# Patient Record
Sex: Female | Born: 1950 | Race: White | Hispanic: No | Marital: Single | Smoking: Former smoker
Health system: Southern US, Community
[De-identification: ages and names within clinical notes are randomized; demographics above are authoritative.]

## PROBLEM LIST (undated history)

## (undated) DIAGNOSIS — E119 Type 2 diabetes mellitus without complications: Secondary | ICD-10-CM

## (undated) DIAGNOSIS — F32A Depression, unspecified: Secondary | ICD-10-CM

## (undated) DIAGNOSIS — F209 Schizophrenia, unspecified: Secondary | ICD-10-CM

## (undated) DIAGNOSIS — I639 Cerebral infarction, unspecified: Secondary | ICD-10-CM

## (undated) DIAGNOSIS — N3946 Mixed incontinence: Secondary | ICD-10-CM

## (undated) DIAGNOSIS — J189 Pneumonia, unspecified organism: Secondary | ICD-10-CM

## (undated) DIAGNOSIS — I10 Essential (primary) hypertension: Secondary | ICD-10-CM

## (undated) DIAGNOSIS — E785 Hyperlipidemia, unspecified: Secondary | ICD-10-CM

## (undated) DIAGNOSIS — M810 Age-related osteoporosis without current pathological fracture: Secondary | ICD-10-CM

## (undated) DIAGNOSIS — T7840XA Allergy, unspecified, initial encounter: Secondary | ICD-10-CM

## (undated) HISTORY — DX: Allergy, unspecified, initial encounter: T78.40XA

## (undated) HISTORY — DX: Cerebral infarction, unspecified: I63.9

## (undated) HISTORY — DX: Schizophrenia, unspecified: F20.9

## (undated) HISTORY — DX: Essential (primary) hypertension: I10

## (undated) HISTORY — DX: Depression, unspecified: F32.A

## (undated) HISTORY — DX: Age-related osteoporosis without current pathological fracture: M81.0

## (undated) HISTORY — DX: Hyperlipidemia, unspecified: E78.5

## (undated) HISTORY — DX: Mixed incontinence: N39.46

---

## 1999-03-02 ENCOUNTER — Encounter: Admission: RE | Admit: 1999-03-02 | Discharge: 1999-03-02 | Payer: Self-pay | Admitting: Obstetrics

## 1999-03-23 ENCOUNTER — Encounter: Admission: RE | Admit: 1999-03-23 | Discharge: 1999-03-23 | Payer: Self-pay | Admitting: Obstetrics & Gynecology

## 1999-04-11 ENCOUNTER — Ambulatory Visit (HOSPITAL_COMMUNITY): Admission: RE | Admit: 1999-04-11 | Discharge: 1999-04-11 | Payer: Self-pay

## 1999-04-18 ENCOUNTER — Ambulatory Visit (HOSPITAL_COMMUNITY): Admission: RE | Admit: 1999-04-18 | Discharge: 1999-04-18 | Payer: Self-pay | Admitting: *Deleted

## 1999-04-18 ENCOUNTER — Encounter: Payer: Self-pay | Admitting: *Deleted

## 1999-10-17 ENCOUNTER — Ambulatory Visit (HOSPITAL_COMMUNITY): Admission: RE | Admit: 1999-10-17 | Discharge: 1999-10-17 | Payer: Self-pay

## 1999-10-17 ENCOUNTER — Encounter: Payer: Self-pay | Admitting: *Deleted

## 2000-06-27 ENCOUNTER — Other Ambulatory Visit: Admission: RE | Admit: 2000-06-27 | Discharge: 2000-06-27 | Payer: Self-pay | Admitting: Obstetrics & Gynecology

## 2000-07-16 ENCOUNTER — Encounter: Admission: RE | Admit: 2000-07-16 | Discharge: 2000-07-16 | Payer: Self-pay | Admitting: Obstetrics and Gynecology

## 2000-07-16 ENCOUNTER — Encounter: Payer: Self-pay | Admitting: Obstetrics and Gynecology

## 2000-07-17 ENCOUNTER — Encounter: Payer: Self-pay | Admitting: Obstetrics and Gynecology

## 2000-07-17 ENCOUNTER — Ambulatory Visit (HOSPITAL_COMMUNITY): Admission: RE | Admit: 2000-07-17 | Discharge: 2000-07-17 | Payer: Self-pay | Admitting: Obstetrics and Gynecology

## 2001-07-10 ENCOUNTER — Other Ambulatory Visit: Admission: RE | Admit: 2001-07-10 | Discharge: 2001-07-10 | Payer: Self-pay | Admitting: Obstetrics and Gynecology

## 2001-07-18 ENCOUNTER — Ambulatory Visit (HOSPITAL_COMMUNITY): Admission: RE | Admit: 2001-07-18 | Discharge: 2001-07-18 | Payer: Self-pay | Admitting: Obstetrics and Gynecology

## 2001-07-18 ENCOUNTER — Encounter: Payer: Self-pay | Admitting: Obstetrics and Gynecology

## 2002-07-10 ENCOUNTER — Other Ambulatory Visit: Admission: RE | Admit: 2002-07-10 | Discharge: 2002-07-10 | Payer: Self-pay | Admitting: Obstetrics and Gynecology

## 2002-09-04 ENCOUNTER — Encounter: Payer: Self-pay | Admitting: Obstetrics and Gynecology

## 2002-09-04 ENCOUNTER — Ambulatory Visit (HOSPITAL_COMMUNITY): Admission: RE | Admit: 2002-09-04 | Discharge: 2002-09-04 | Payer: Self-pay | Admitting: Obstetrics and Gynecology

## 2003-11-16 ENCOUNTER — Ambulatory Visit (HOSPITAL_COMMUNITY): Admission: RE | Admit: 2003-11-16 | Discharge: 2003-11-16 | Payer: Self-pay | Admitting: Obstetrics and Gynecology

## 2004-11-17 ENCOUNTER — Ambulatory Visit (HOSPITAL_COMMUNITY): Admission: RE | Admit: 2004-11-17 | Discharge: 2004-11-17 | Payer: Self-pay | Admitting: Gynecology

## 2005-01-01 ENCOUNTER — Other Ambulatory Visit: Admission: RE | Admit: 2005-01-01 | Discharge: 2005-01-01 | Payer: Self-pay | Admitting: Obstetrics and Gynecology

## 2006-04-01 ENCOUNTER — Ambulatory Visit (HOSPITAL_COMMUNITY): Admission: RE | Admit: 2006-04-01 | Discharge: 2006-04-01 | Payer: Self-pay | Admitting: Obstetrics and Gynecology

## 2006-04-02 ENCOUNTER — Other Ambulatory Visit: Admission: RE | Admit: 2006-04-02 | Discharge: 2006-04-02 | Payer: Self-pay | Admitting: Obstetrics and Gynecology

## 2007-03-19 ENCOUNTER — Ambulatory Visit (HOSPITAL_COMMUNITY): Admission: RE | Admit: 2007-03-19 | Discharge: 2007-03-19 | Payer: Self-pay | Admitting: Obstetrics and Gynecology

## 2009-02-03 ENCOUNTER — Ambulatory Visit (HOSPITAL_COMMUNITY): Admission: RE | Admit: 2009-02-03 | Discharge: 2009-02-03 | Payer: Self-pay | Admitting: Urology

## 2010-12-09 ENCOUNTER — Encounter: Payer: Self-pay | Admitting: Obstetrics and Gynecology

## 2010-12-10 ENCOUNTER — Encounter: Payer: Self-pay | Admitting: Obstetrics and Gynecology

## 2010-12-10 ENCOUNTER — Encounter: Payer: Self-pay | Admitting: Family Medicine

## 2011-03-01 LAB — HEMOGLOBIN AND HEMATOCRIT, BLOOD
HCT: 37.4 % (ref 36.0–46.0)
Hemoglobin: 12.6 g/dL (ref 12.0–15.0)

## 2011-04-03 NOTE — Op Note (Signed)
[name], SERMONS           ACCOUNT NO.:  [id number]   MEDICAL RECORD NO.:  [id number]          PATIENT TYPE:  AMB   LOCATION:  DAY                          FACILITY:  [facility]   PHYSICIAN:  Excell Seltzer. Annabell Howells, M.D.    DATE OF BIRTH:  07/15/1951   DATE OF PROCEDURE:  02/03/2009  DATE OF DISCHARGE:                               OPERATIVE REPORT   PROCEDURE:  Cystoscopy, left retrograde pyelogram with interpretation,  insertion of left double-J stent.   PREOPERATIVE DIAGNOSIS:  Left ureteropelvic junction stone.   POSTOPERATIVE DIAGNOSIS:  Left ureteropelvic junction stone.   SURGEON:  Dr. Bjorn Pippin   ANESTHESIA:  General.   DRAIN:  6-French 24 cm double-J stent.   COMPLICATIONS:  None.   INDICATIONS:  Ms. [name] is a 60 year old white female with 8 mm left  UPJ stone with chronic recurrent urinary tract infections.  She is to  undergo stenting followed by lithotripsy.  She has been on Cipro  chronically for positive culture at her initial visit.   FINDINGS OF PROCEDURE:  She was taken operating room where general  anesthetic was induced.  She was given 400 mg Cipro I.V.  She was placed  in lithotomy position.  Her perineum and genitalia were prepped with  Betadine solution.  She was draped in the  usual sterile fashion.  Time-  out was performed.   Cystoscopy was performed using the 22-French scope and 12 degrees lens  examination revealed a normal urethra.  The bladder wall had mild  trabeculation.  No tumors or stones were noted.  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  The ureteral orifices were  unremarkable.   The left ureteral orifice was cannulated with 5-French open-end catheter  and contrast was instilled.   Retrograde pyelogram revealed a normal ureter up to the UPJ where the  stone was noted.  There was minimal hydronephrosis proximal to the  stone.   A guidewire was passed through the open-end catheter to the kidney by  the  stone.  There was some mildly turbid urine that flowed out.  Once  the wire had been placed, a 6-French 24 cm double-J stent was then  passed over the  wire to the kidney under fluoroscopic guidance.  The wire was removed  leaving good coil in the kidney and good coil in the bladder.  The  bladder was drained.  The patient was taken out of lithotomy position.  Her anesthetic was reversed.  She was moved to the recovery room in  stable condition.  There were no complications.      Excell Seltzer. Annabell Howells, M.D.  Electronically Signed     JJW/MEDQ  D:  02/03/2009  T:  02/03/2009  Job:  [id number]

## 2011-07-27 ENCOUNTER — Other Ambulatory Visit (HOSPITAL_COMMUNITY): Payer: Self-pay | Admitting: Obstetrics and Gynecology

## 2011-07-27 ENCOUNTER — Other Ambulatory Visit (HOSPITAL_COMMUNITY): Payer: Self-pay | Admitting: *Deleted

## 2011-07-27 DIAGNOSIS — Z1231 Encounter for screening mammogram for malignant neoplasm of breast: Secondary | ICD-10-CM

## 2011-07-27 DIAGNOSIS — Z780 Asymptomatic menopausal state: Secondary | ICD-10-CM

## 2011-10-15 ENCOUNTER — Ambulatory Visit (HOSPITAL_COMMUNITY)
Admission: RE | Admit: 2011-10-15 | Discharge: 2011-10-15 | Disposition: A | Discharge status: Home / Self Care | Payer: Medicare Other | Source: Ambulatory Visit | Attending: Obstetrics and Gynecology | Admitting: Obstetrics and Gynecology

## 2011-10-15 DIAGNOSIS — Z13820 Encounter for screening for osteoporosis: Secondary | ICD-10-CM | POA: Insufficient documentation

## 2011-10-15 DIAGNOSIS — Z780 Asymptomatic menopausal state: Secondary | ICD-10-CM | POA: Insufficient documentation

## 2011-10-15 DIAGNOSIS — Z1231 Encounter for screening mammogram for malignant neoplasm of breast: Secondary | ICD-10-CM

## 2011-10-22 ENCOUNTER — Other Ambulatory Visit: Payer: Self-pay | Admitting: Obstetrics and Gynecology

## 2011-10-22 DIAGNOSIS — R928 Other abnormal and inconclusive findings on diagnostic imaging of breast: Secondary | ICD-10-CM

## 2011-11-09 ENCOUNTER — Ambulatory Visit
Admission: RE | Admit: 2011-11-09 | Discharge: 2011-11-09 | Disposition: A | Discharge status: Home / Self Care | Payer: Medicare Other | Source: Ambulatory Visit | Attending: Obstetrics and Gynecology | Admitting: Obstetrics and Gynecology

## 2011-11-09 DIAGNOSIS — R928 Other abnormal and inconclusive findings on diagnostic imaging of breast: Secondary | ICD-10-CM

## 2011-11-14 ENCOUNTER — Other Ambulatory Visit: Payer: Medicare Other

## 2012-11-18 ENCOUNTER — Telehealth: Payer: Self-pay | Admitting: Obstetrics and Gynecology

## 2012-11-18 NOTE — Telephone Encounter (Signed)
TC to pt reccommended pt to try AZO otc and drink plenty of fluids. Advised pt that if she was unable to try this reccommended urgent care.  Pt will try AZO and if sx worsen will return call  Pt voiced understanding  Darien Ramus, CMA

## 2012-11-28 ENCOUNTER — Other Ambulatory Visit (HOSPITAL_COMMUNITY): Payer: Self-pay | Admitting: Physician Assistant

## 2012-11-28 DIAGNOSIS — Z1231 Encounter for screening mammogram for malignant neoplasm of breast: Secondary | ICD-10-CM

## 2012-12-09 ENCOUNTER — Ambulatory Visit (HOSPITAL_COMMUNITY): Payer: Medicare Other

## 2012-12-10 ENCOUNTER — Telehealth: Payer: Self-pay

## 2012-12-10 ENCOUNTER — Encounter: Payer: Self-pay | Admitting: Obstetrics and Gynecology

## 2012-12-10 NOTE — Telephone Encounter (Signed)
Faxed auth for 1 RF ONLY  On Premarin vaginal cream # 30 gm tube sig: use vaginally qod for two weeks, then use twice a week. , per EP to [facility] # [phone]. Melody Comas A

## 2012-12-15 ENCOUNTER — Ambulatory Visit (HOSPITAL_COMMUNITY)
Admission: RE | Admit: 2012-12-15 | Discharge: 2012-12-15 | Disposition: A | Discharge status: Home / Self Care | Payer: Medicare Other | Source: Ambulatory Visit | Attending: Physician Assistant | Admitting: Physician Assistant

## 2012-12-15 DIAGNOSIS — Z1231 Encounter for screening mammogram for malignant neoplasm of breast: Secondary | ICD-10-CM | POA: Insufficient documentation

## 2012-12-18 ENCOUNTER — Ambulatory Visit: Payer: Self-pay | Admitting: Family Medicine

## 2012-12-18 VITALS — BP 130/82 | HR 88 | Temp 98.5°F | Resp 16 | Ht 65.0 in | Wt 187.0 lb

## 2012-12-18 DIAGNOSIS — Z0289 Encounter for other administrative examinations: Secondary | ICD-10-CM

## 2012-12-18 DIAGNOSIS — Z0000 Encounter for general adult medical examination without abnormal findings: Secondary | ICD-10-CM

## 2012-12-18 NOTE — Progress Notes (Signed)
Patient for DOT physical.  Taking meds as directed.  No h/o significant psychosis or depression.  See DOT form  Approved for 2 years.

## 2012-12-23 ENCOUNTER — Encounter: Payer: Self-pay | Admitting: Family Medicine

## 2013-04-15 ENCOUNTER — Other Ambulatory Visit (HOSPITAL_COMMUNITY): Payer: Self-pay | Admitting: Physician Assistant

## 2013-04-15 DIAGNOSIS — Z1231 Encounter for screening mammogram for malignant neoplasm of breast: Secondary | ICD-10-CM

## 2014-04-23 ENCOUNTER — Other Ambulatory Visit (HOSPITAL_COMMUNITY): Payer: Self-pay | Admitting: Physician Assistant

## 2014-04-23 DIAGNOSIS — Z1231 Encounter for screening mammogram for malignant neoplasm of breast: Secondary | ICD-10-CM

## 2014-05-20 ENCOUNTER — Ambulatory Visit (HOSPITAL_COMMUNITY): Payer: Medicare Other

## 2014-05-20 ENCOUNTER — Ambulatory Visit (HOSPITAL_COMMUNITY)
Admission: RE | Admit: 2014-05-20 | Discharge: 2014-05-20 | Disposition: A | Discharge status: Home / Self Care | Payer: Medicare Other | Source: Ambulatory Visit | Attending: Physician Assistant | Admitting: Physician Assistant

## 2014-05-20 DIAGNOSIS — Z1231 Encounter for screening mammogram for malignant neoplasm of breast: Secondary | ICD-10-CM

## 2014-05-22 ENCOUNTER — Inpatient Hospital Stay (HOSPITAL_BASED_OUTPATIENT_CLINIC_OR_DEPARTMENT_OTHER)
Admission: EM | Admit: 2014-05-22 | Discharge: 2014-05-24 | DRG: 639 | Disposition: A | Discharge status: Home / Self Care | Payer: Medicare Other | Attending: Internal Medicine | Admitting: Internal Medicine

## 2014-05-22 ENCOUNTER — Emergency Department (HOSPITAL_BASED_OUTPATIENT_CLINIC_OR_DEPARTMENT_OTHER): Payer: Medicare Other

## 2014-05-22 ENCOUNTER — Ambulatory Visit (INDEPENDENT_AMBULATORY_CARE_PROVIDER_SITE_OTHER): Payer: Medicare Other | Admitting: Family Medicine

## 2014-05-22 ENCOUNTER — Encounter (HOSPITAL_BASED_OUTPATIENT_CLINIC_OR_DEPARTMENT_OTHER): Payer: Self-pay | Admitting: Emergency Medicine

## 2014-05-22 VITALS — BP 120/72 | HR 76 | Temp 98.1°F | Resp 18 | Ht 65.0 in | Wt 163.0 lb

## 2014-05-22 DIAGNOSIS — E1165 Type 2 diabetes mellitus with hyperglycemia: Secondary | ICD-10-CM

## 2014-05-22 DIAGNOSIS — Z8249 Family history of ischemic heart disease and other diseases of the circulatory system: Secondary | ICD-10-CM

## 2014-05-22 DIAGNOSIS — E119 Type 2 diabetes mellitus without complications: Secondary | ICD-10-CM | POA: Insufficient documentation

## 2014-05-22 DIAGNOSIS — Z794 Long term (current) use of insulin: Secondary | ICD-10-CM

## 2014-05-22 DIAGNOSIS — E1110 Type 2 diabetes mellitus with ketoacidosis without coma: Secondary | ICD-10-CM

## 2014-05-22 DIAGNOSIS — IMO0002 Reserved for concepts with insufficient information to code with codable children: Secondary | ICD-10-CM

## 2014-05-22 DIAGNOSIS — R350 Frequency of micturition: Secondary | ICD-10-CM

## 2014-05-22 DIAGNOSIS — Z87891 Personal history of nicotine dependence: Secondary | ICD-10-CM

## 2014-05-22 DIAGNOSIS — F39 Unspecified mood [affective] disorder: Secondary | ICD-10-CM | POA: Diagnosis present

## 2014-05-22 DIAGNOSIS — R739 Hyperglycemia, unspecified: Secondary | ICD-10-CM

## 2014-05-22 DIAGNOSIS — Z79899 Other long term (current) drug therapy: Secondary | ICD-10-CM

## 2014-05-22 DIAGNOSIS — Z880 Allergy status to penicillin: Secondary | ICD-10-CM

## 2014-05-22 DIAGNOSIS — N3946 Mixed incontinence: Secondary | ICD-10-CM | POA: Diagnosis present

## 2014-05-22 DIAGNOSIS — F209 Schizophrenia, unspecified: Secondary | ICD-10-CM | POA: Diagnosis present

## 2014-05-22 DIAGNOSIS — E118 Type 2 diabetes mellitus with unspecified complications: Principal | ICD-10-CM

## 2014-05-22 DIAGNOSIS — Z823 Family history of stroke: Secondary | ICD-10-CM

## 2014-05-22 DIAGNOSIS — R7309 Other abnormal glucose: Secondary | ICD-10-CM

## 2014-05-22 DIAGNOSIS — K5900 Constipation, unspecified: Secondary | ICD-10-CM

## 2014-05-22 DIAGNOSIS — R81 Glycosuria: Secondary | ICD-10-CM

## 2014-05-22 DIAGNOSIS — E1310 Other specified diabetes mellitus with ketoacidosis without coma: Principal | ICD-10-CM | POA: Diagnosis present

## 2014-05-22 DIAGNOSIS — Z836 Family history of other diseases of the respiratory system: Secondary | ICD-10-CM

## 2014-05-22 LAB — BASIC METABOLIC PANEL
Anion gap: >20 — ABNORMAL HIGH (ref 5–15)
Anion gap: 20 — ABNORMAL HIGH (ref 5–15)
Anion gap: 18 — ABNORMAL HIGH (ref 5–15)
BUN: 15 mg/dL (ref 6–23)
BUN: 11 mg/dL (ref 6–23)
BUN: 11 mg/dL (ref 6–23)
CO2: 13 mEq/L — ABNORMAL LOW (ref 19–32)
CO2: 13 mEq/L — ABNORMAL LOW (ref 19–32)
CO2: 15 meq/L — AB (ref 19–32)
CREATININE: 0.51 mg/dL (ref 0.50–1.10)
CREATININE: 0.77 mg/dL (ref 0.50–1.10)
Calcium: 9.3 mg/dL (ref 8.4–10.5)
Calcium: 8.4 mg/dL (ref 8.4–10.5)
Calcium: 8.3 mg/dL — ABNORMAL LOW (ref 8.4–10.5)
Chloride: 95 mEq/L — ABNORMAL LOW (ref 96–112)
Chloride: 104 mEq/L (ref 96–112)
Chloride: 104 mEq/L (ref 96–112)
Creatinine, Ser: 0.7 mg/dL (ref 0.50–1.10)
GFR calc Af Amer: >90 mL/min (ref >90)
GFR calc Af Amer: >90 mL/min (ref >90)
GFR calc Af Amer: >90 mL/min (ref >90)
GFR calc non Af Amer: >90 mL/min (ref >90)
GFR calc non Af Amer: >90 mL/min (ref >90)
GFR calc non Af Amer: 89 mL/min — ABNORMAL LOW (ref >90)
GLUCOSE: 679 mg/dL — AB (ref 70–99)
GLUCOSE: 323 mg/dL — AB (ref 70–99)
GLUCOSE: 331 mg/dL — AB (ref 70–99)
POTASSIUM: 4 meq/L (ref 3.7–5.3)
Potassium: 3.7 mEq/L (ref 3.7–5.3)
Potassium: 3.7 mEq/L (ref 3.7–5.3)
Sodium: 132 mEq/L — ABNORMAL LOW (ref 137–147)
Sodium: 137 mEq/L (ref 137–147)
Sodium: 137 mEq/L (ref 137–147)

## 2014-05-22 LAB — POCT URINALYSIS DIPSTICK
Bilirubin, UA: NEGATIVE
Blood, UA: NEGATIVE
Glucose, UA: 500
Ketones, UA: 160
Leukocytes, UA: NEGATIVE
Nitrite, UA: NEGATIVE
Protein, UA: NEGATIVE
Spec Grav, UA: <=1.005
Urobilinogen, UA: 0.2
pH, UA: 5

## 2014-05-22 LAB — URINALYSIS, ROUTINE W REFLEX MICROSCOPIC
BILIRUBIN URINE: NEGATIVE
Glucose, UA: >1000 mg/dL — AB
HGB URINE DIPSTICK: NEGATIVE
Ketones, ur: >80 mg/dL — AB
Leukocytes, UA: NEGATIVE
NITRITE: NEGATIVE
PROTEIN: NEGATIVE mg/dL
Specific Gravity, Urine: 1.038 — ABNORMAL HIGH (ref 1.005–1.030)
UROBILINOGEN UA: 0.2 mg/dL (ref 0.0–1.0)
pH: 5 (ref 5.0–8.0)

## 2014-05-22 LAB — CBC WITH DIFFERENTIAL/PLATELET
Band Neutrophils: 0 % (ref 0–10)
Basophils Absolute: 0.1 10*3/uL (ref 0.0–0.1)
Basophils Relative: 2 % — ABNORMAL HIGH (ref 0–1)
Blasts: 0 %
EOS PCT: 2 % (ref 0–5)
Eosinophils Absolute: 0.1 10*3/uL (ref 0.0–0.7)
HCT: 37.3 % (ref 36.0–46.0)
Hemoglobin: 12.9 g/dL (ref 12.0–15.0)
Lymphocytes Relative: 12 % (ref 12–46)
Lymphs Abs: 0.5 10*3/uL — ABNORMAL LOW (ref 0.7–4.0)
MCH: 27.5 pg (ref 26.0–34.0)
MCHC: 34.6 g/dL (ref 30.0–36.0)
MCV: 79.5 fL (ref 78.0–100.0)
Metamyelocytes Relative: 0 %
Monocytes Absolute: 0.4 10*3/uL (ref 0.1–1.0)
Monocytes Relative: 9 % (ref 3–12)
Myelocytes: 0 %
NEUTROS PCT: 75 % (ref 43–77)
Neutro Abs: 2.8 10*3/uL (ref 1.7–7.7)
Platelets: 153 10*3/uL (ref 150–400)
Promyelocytes Absolute: 0 %
RBC: 4.69 MIL/uL (ref 3.87–5.11)
RDW: 15.2 % (ref 11.5–15.5)
WBC: 3.9 10*3/uL — ABNORMAL LOW (ref 4.0–10.5)
nRBC: 0 /100 WBC

## 2014-05-22 LAB — GLUCOSE, CAPILLARY
GLUCOSE-CAPILLARY: 337 mg/dL — AB (ref 70–99)
GLUCOSE-CAPILLARY: 323 mg/dL — AB (ref 70–99)
GLUCOSE-CAPILLARY: 306 mg/dL — AB (ref 70–99)
Glucose-Capillary: 307 mg/dL — ABNORMAL HIGH (ref 70–99)
Glucose-Capillary: 328 mg/dL — ABNORMAL HIGH (ref 70–99)

## 2014-05-22 LAB — POCT UA - MICROSCOPIC ONLY
Casts, Ur, LPF, POC: NEGATIVE
Crystals, Ur, HPF, POC: NEGATIVE
Mucus, UA: NEGATIVE
Yeast, UA: POSITIVE

## 2014-05-22 LAB — I-STAT VENOUS BLOOD GAS, ED
Acid-base deficit: 12 mmol/L — ABNORMAL HIGH (ref 0.0–2.0)
BICARBONATE: 14.9 meq/L — AB (ref 20.0–24.0)
O2 Saturation: 20 %
PH VEN: 7.21 — AB (ref 7.250–7.300)
PO2 VEN: 18 mmHg — AB (ref 30.0–45.0)
Patient temperature: 98.2
TCO2: 16 mmol/L (ref 0–100)
pCO2, Ven: 37.2 mmHg — ABNORMAL LOW (ref 45.0–50.0)

## 2014-05-22 LAB — URINE MICROSCOPIC-ADD ON

## 2014-05-22 LAB — COMPREHENSIVE METABOLIC PANEL
ALT: 23 U/L (ref 0–35)
AST: 13 U/L (ref 0–37)
Albumin: 4.2 g/dL (ref 3.5–5.2)
Alkaline Phosphatase: 178 U/L — ABNORMAL HIGH (ref 39–117)
BUN: 14 mg/dL (ref 6–23)
CO2: 15 mEq/L — ABNORMAL LOW (ref 19–32)
Calcium: 9.2 mg/dL (ref 8.4–10.5)
Chloride: 98 mEq/L (ref 96–112)
Creat: 1.02 mg/dL (ref 0.50–1.10)
Glucose, Bld: 471 mg/dL — ABNORMAL HIGH (ref 70–99)
Potassium: 4 mEq/L (ref 3.5–5.3)
Sodium: 131 mEq/L — ABNORMAL LOW (ref 135–145)
Total Bilirubin: 0.6 mg/dL (ref 0.2–1.2)
Total Protein: 6.7 g/dL (ref 6.0–8.3)

## 2014-05-22 LAB — GLUCOSE, POCT (MANUAL RESULT ENTRY)

## 2014-05-22 LAB — CBG MONITORING, ED
GLUCOSE-CAPILLARY: 390 mg/dL — AB (ref 70–99)
GLUCOSE-CAPILLARY: 369 mg/dL — AB (ref 70–99)
Glucose-Capillary: >600 mg/dL (ref 70–99)

## 2014-05-22 LAB — POCT GLYCOSYLATED HEMOGLOBIN (HGB A1C): Hemoglobin A1C: 12.2

## 2014-05-22 MED ORDER — ONDANSETRON HCL 4 MG/2ML IJ SOLN: 4.0000 mg | Freq: Three times a day (TID) | INTRAMUSCULAR | Status: DC | PRN

## 2014-05-22 MED ORDER — HYDROCODONE-ACETAMINOPHEN 5-325 MG PO TABS: 1.0000 | ORAL_TABLET | Freq: Four times a day (QID) | ORAL | Status: DC | PRN

## 2014-05-22 MED ORDER — SODIUM CHLORIDE 0.9 % IV BOLUS (SEPSIS)
1000.0000 mL | Freq: Once | INTRAVENOUS | Status: AC
  Administered 2014-05-22: 1000 mL via INTRAVENOUS

## 2014-05-22 MED ORDER — BISACODYL 10 MG RE SUPP: 10.0000 mg | Freq: Every day | RECTAL | Status: DC

## 2014-05-22 MED ORDER — HEPARIN SODIUM (PORCINE) 5000 UNIT/ML IJ SOLN
5000.0000 [IU] | Freq: Three times a day (TID) | INTRAMUSCULAR | Status: DC
Start: 2014-05-22 — End: 2014-05-24
  Administered 2014-05-22 – 2014-05-24 (×5): 5000 [IU] via SUBCUTANEOUS
  Filled 2014-05-22 (×8): qty 1

## 2014-05-22 MED ORDER — ALUM & MAG HYDROXIDE-SIMETH 200-200-20 MG/5ML PO SUSP: 30.0000 mL | Freq: Four times a day (QID) | ORAL | Status: DC | PRN

## 2014-05-22 MED ORDER — INSULIN REGULAR HUMAN 100 UNIT/ML IJ SOLN
10.0000 [IU] | Freq: Once | INTRAMUSCULAR | Status: AC
  Administered 2014-05-22: 10 [IU] via SUBCUTANEOUS
  Filled 2014-05-22: qty 1

## 2014-05-22 MED ORDER — POLYETHYLENE GLYCOL 3350 17 G PO PACK
17.0000 g | PACK | Freq: Every day | ORAL | Status: DC
  Administered 2014-05-22 – 2014-05-23 (×2): 17 g via ORAL
  Filled 2014-05-22 (×3): qty 1

## 2014-05-22 MED ORDER — INSULIN GLARGINE 100 UNIT/ML ~~LOC~~ SOLN
15.0000 [IU] | Freq: Every day | SUBCUTANEOUS | Status: DC
  Administered 2014-05-22: 15 [IU] via SUBCUTANEOUS
  Filled 2014-05-22 (×2): qty 0.15

## 2014-05-22 MED ORDER — INSULIN REGULAR HUMAN 100 UNIT/ML IJ SOLN
INTRAMUSCULAR | Status: AC
  Filled 2014-05-22: qty 1

## 2014-05-22 MED ORDER — SODIUM CHLORIDE 0.9 % IV SOLN: INTRAVENOUS | Status: DC

## 2014-05-22 MED ORDER — DEXTROSE 50 % IV SOLN: 25.0000 mL | INTRAVENOUS | Status: DC | PRN

## 2014-05-22 MED ORDER — BUPROPION HCL ER (XL) 150 MG PO TB24
150.0000 mg | ORAL_TABLET | Freq: Every day | ORAL | Status: DC
  Administered 2014-05-22 – 2014-05-24 (×3): 150 mg via ORAL
  Filled 2014-05-22 (×3): qty 1

## 2014-05-22 MED ORDER — DOCUSATE SODIUM 100 MG PO CAPS
200.0000 mg | ORAL_CAPSULE | Freq: Two times a day (BID) | ORAL | Status: DC
  Administered 2014-05-22 – 2014-05-24 (×4): 200 mg via ORAL
  Filled 2014-05-22 (×4): qty 2

## 2014-05-22 MED ORDER — SODIUM CHLORIDE 0.9 % IV SOLN
INTRAVENOUS | Status: DC
  Filled 2014-05-22 (×2): qty 1

## 2014-05-22 MED ORDER — DEXTROSE-NACL 5-0.45 % IV SOLN: INTRAVENOUS | Status: DC

## 2014-05-22 MED ORDER — BENZTROPINE MESYLATE 0.5 MG PO TABS
0.5000 mg | ORAL_TABLET | Freq: Two times a day (BID) | ORAL | Status: DC
  Administered 2014-05-22 – 2014-05-24 (×4): 0.5 mg via ORAL
  Filled 2014-05-22 (×5): qty 1

## 2014-05-22 MED ORDER — ALBUTEROL SULFATE (2.5 MG/3ML) 0.083% IN NEBU: 2.5000 mg | INHALATION_SOLUTION | RESPIRATORY_TRACT | Status: DC | PRN

## 2014-05-22 MED ORDER — SODIUM CHLORIDE 0.9 % IV SOLN
INTRAVENOUS | Status: DC
  Administered 2014-05-22: 19:00:00 via INTRAVENOUS

## 2014-05-22 MED ORDER — ONDANSETRON HCL 4 MG/2ML IJ SOLN: 4.0000 mg | Freq: Four times a day (QID) | INTRAMUSCULAR | Status: DC | PRN

## 2014-05-22 MED ORDER — ARIPIPRAZOLE 10 MG PO TABS
10.0000 mg | ORAL_TABLET | Freq: Every day | ORAL | Status: DC
  Administered 2014-05-22 – 2014-05-24 (×3): 10 mg via ORAL
  Filled 2014-05-22 (×3): qty 1

## 2014-05-22 NOTE — Progress Notes (Signed)
Hospitalist accepting note:  Call[facility].  Patient with past medical history of mood disorder comes in with one week of urinary frequency and found to be in DKA.  CBGs already down to 300s. Anion gap at 23.  Patient looks to be relatively stable including vital signs and normal renal function still opted for MedSurg bed.  Patient not extremely overweight, so questionable why diabetes would suddenly develop? Also noted to be constipated on abdominal x-ray

## 2014-05-22 NOTE — Patient Instructions (Signed)
You do have quite out of control diabetes.  Please proceed to the [facility] [place] [facility] located at:  Address: [address], [place], [place] [address]  [phone]  I will give them a call, and they will be able to do the further testing that you need and get your blood sugar under better control.

## 2014-05-22 NOTE — ED Notes (Signed)
Patient here from urgent care for hyperglycemia, unable to be read at ucc, only complaint is dysuria and urinary frequency with thirst x 1 week, no hx of diabetes

## 2014-05-22 NOTE — Progress Notes (Signed)
[facility] [address], [place] [address] [phone]  Date:  05/22/2014   Name:  Abigail RutherfordDeborah A Gough   DOB:  [birth year]   MRN:  [id number]  PCP:  No primary provider on file.    Chief Complaint: Urinary Frequency   History of Present Illness:  [name] is a 63 y.o. very pleasant female patient who presents with the following:  She is here today with a urinary complaint.  She feels like she needs to urinate frequently, but there is not much there when she does go. No dysuria.  This has been present for about one week.  No abdominal pain or back pain, no fever.   She has had this sort of thing in the past; she has been dx with UTI in the past.    She has not used any pyridium.   She is not aware of any prior history of DM.   She does drink a lot of water, has been very thirsty/ dry mouth, and has noted some weight loss- she has lost 10- 12 lbs. She has noted that she has felt thirsty and very fatigued recently.  Her father had history of DM.    There are no active problems to display for this patient.   Past Medical History  Diagnosis Date  . Mixed stress and urge urinary incontinence   . Schizophrenia   . Allergy     History reviewed. No pertinent past surgical history.  History  Substance Use Topics  . Smoking status: Former Smoker -- 3.00 packs/day    Types: Cigarettes  . Smokeless tobacco: Not on file  . Alcohol Use: 3.6 oz/week    6 Cans of beer per week    Family History  Problem Relation Age of Onset  . Heart disease Mother   . Emphysema Paternal Grandfather   . Hypertension Father   . Stroke Father   . Heart disease Father     Allergies  Allergen Reactions  . Penicillins     Rash     Medication list has been reviewed and updated.  Current Outpatient Prescriptions on File Prior to Visit  Medication Sig Dispense Refill  . ARIPiprazole (ABILIFY) 10 MG tablet Take 10 mg by mouth daily.      . benztropine  (COGENTIN) 0.5 MG tablet Take 0.5 mg by mouth 2 (two) times daily.       Marland Kitchen. buPROPion (WELLBUTRIN XL) 150 MG 24 hr tablet Take 150 mg by mouth daily.       No current facility-administered medications on file prior to visit.    Review of Systems:  As per HPI- otherwise negative.   Physical Examination: Filed Vitals:   05/22/14 0911  BP: 120/72  Pulse: 76  Temp: 98.1 F (36.7 C)  Resp: 18   Filed Vitals:   05/22/14 0911  Height: 5\' 5"  (1.651 m)  Weight: 163 lb (73.936 kg)   Body mass index is 27.12 kg/(m^2). Ideal Body Weight: Weight in (lb) to have BMI = 25: 149.9  GEN: WDWN, NAD, Non-toxic, A & O x 3, looks well. Smells of ketones.   HEENT: Atraumatic, Normocephalic. Neck supple. No masses, No LAD. Ears and Nose: No external deformity. CV: RRR, No M/G/R. No JVD. No thrill. No extra heart sounds. PULM: CTA B, no wheezes, crackles, rhonchi. No retractions. No resp. distress. No accessory muscle use. ABD: S, NT, ND. No rebound. No HSM. EXTR: No c/c/e NEURO Normal gait.  PSYCH: Normally interactive. Conversant. Not depressed or anxious appearing.  Calm demeanor.  No CVA tenderness, abdomen in benign    Results for orders placed in visit on 05/22/14  POCT URINALYSIS DIPSTICK      Result Value Ref Range   Color, UA light yellow     Clarity, UA clear     Glucose, UA 500     Bilirubin, UA neg     Ketones, UA 160     Spec Grav, UA <=1.005     Blood, UA neg     pH, UA 5.0     Protein, UA neg     Urobilinogen, UA 0.2     Nitrite, UA neg     Leukocytes, UA Negative    POCT UA - MICROSCOPIC ONLY      Result Value Ref Range   WBC, Ur, HPF, POC 2-3     RBC, urine, microscopic 0-2     Bacteria, U Microscopic trace     Mucus, UA neg     Epithelial cells, urine per micros 1-3     Crystals, Ur, HPF, POC neg     Casts, Ur, LPF, POC neg     Yeast, UA positive    POCT GLYCOSYLATED HEMOGLOBIN (HGB A1C)      Result Value Ref Range   Hemoglobin A1C 12.2    GLUCOSE, POCT  (MANUAL RESULT ENTRY)      Result Value Ref Range   POC Glucose over 444  70 - 99 mg/dl    Assessment and Plan: Increased urinary frequency - Plan: POCT urinalysis dipstick, POCT UA - Microscopic Only, Urine culture  Glycosuria - Plan: POCT glucose (manual entry), Comprehensive metabolic panel  Elevated blood sugar - Plan: POCT glycosylated hemoglobin (Hb A1C)  Abigail PoundDeborah is here today to discuss urinary frequency.  However it turns out her urinary frequency is likely due to DM/ high blood glucose. I did send out a urine culture. Discussed the need for further evlauation and treatment today.  I am not able to measure her anion gap or glucose here today.  Recommended that [facility] for further eval and possible admission.  However she adamantly refuses to go to either of these facilities; she is willing to go to [facility].  I am hopeful that she will not require admission as she looks and feels fairly well, so this is an acceptable alternative.  She will proceed to this facility now, called ahead to charge nurse.    Signed Abbe AmsterdamJessica Gus Littler, MD

## 2014-05-22 NOTE — ED Notes (Signed)
Pt ambulatory to restroom

## 2014-05-22 NOTE — ED Notes (Signed)
MD at bedside. 

## 2014-05-22 NOTE — H&P (Addendum)
Patient Demographics  [name], is a 63 y.o. female  MRN: [id number]   DOB - 12-15-1950  Admit Date - 05/22/2014  Outpatient Primary MD for the patient is Pcp Not In System   With History of -  Past Medical History  Diagnosis Date  . Mixed stress and urge urinary incontinence   . Schizophrenia   . Allergy       History reviewed. No pertinent past surgical history.  in for   Chief Complaint  Patient presents with  . Hyperglycemia     HPI  [name]  is a 63 y.o. female, with history of schizophrenia and mood disorder otherwise healthy presented to a local urgent care with chief complaints of frequency in urination, excessive thirst and hunger for the last few weeks, at the urgent care she was noted to have high blood sugars and transferred to [facility], at [facility] [place] she was diagnosed with DKA and transferred to [facility] for further care.   Patient denies any fever chills, no headache, no new vision or hearing, no chest pain cough phlegm shortness of breath, no abdominal pain, no dysuria but frequency in urination, no diarrhea, no blood in stool or urine, no focal weakness. She does have some generalized weakness and fatigue. No previous history of diabetes mellitus, no family history of DM either, she quit smoking one year ago, drinks 1 can of beer every 1-2 days.    Review of Systems    In addition to the HPI above,  No Fever-chills, No Headache, No changes with Vision or hearing, No problems swallowing food or Liquids, No Chest pain, Cough or Shortness of Breath, No Abdominal pain, No Nausea or Vommitting, she has been constipated for the last few days, No Blood in stool or Urine, No dysuria, No new skin rashes or bruises, No new joints pains-aches,  No new  weakness, tingling, numbness in any extremity, No recent weight gain or loss, Positive polyuria, polydypsia or polyphagia, along with fatigue. No significant Mental Stressors.  A full 10 point Review of Systems was done, except as stated above, all other Review of Systems were negative.   Social History History  Substance Use Topics  . Smoking status: Former Smoker -- 3.00 packs/day    Types: Cigarettes  . Smokeless tobacco: Not on file  . Alcohol Use: 3.6 oz/week    6 Cans of beer per week      Family History Family History  Problem Relation Age of Onset  . Heart disease Mother   . Emphysema Paternal Grandfather   . Hypertension Father   . Stroke Father   . Heart disease Father       Prior to Admission medications   Medication Sig Start Date End Date Taking? Authorizing Provider  ARIPiprazole (ABILIFY) 10 MG tablet Take 10 mg by mouth daily.    Historical Provider, MD  benztropine (COGENTIN) 0.5 MG tablet Take 0.5 mg by  mouth 2 (two) times daily.     Historical Provider, MD  buPROPion (WELLBUTRIN XL) 150 MG 24 hr tablet Take 150 mg by mouth daily.    Historical Provider, MD    Allergies  Allergen Reactions  . Penicillins     Rash     Physical Exam  Vitals  Blood pressure 122/64, pulse 58, temperature 98.1 F (36.7 C), temperature source Oral, resp. rate 17, height 5\' 5"  (1.651 m), weight 74.844 kg (165 lb), SpO2 100.00%.   1. General middle-aged white female lying in bed in NAD,     2. Normal affect and insight, Not Suicidal or Homicidal, Awake Alert, Oriented X 3.  3. No F.N deficits, ALL C.Nerves Intact, Strength 5/5 all 4 extremities, Sensation intact all 4 extremities, Plantars down going.  4. Ears and Eyes appear Normal, Conjunctivae clear, PERRLA. Moist Oral Mucosa.  5. Supple Neck, No JVD, No cervical lymphadenopathy appriciated, No Carotid Bruits.  6. Symmetrical Chest wall movement, Good air movement bilaterally, CTAB.  7. RRR, No Gallops,  Rubs or Murmurs, No Parasternal Heave.  8. Positive Bowel Sounds, Abdomen Soft, No tenderness, No organomegaly appriciated,No rebound -guarding or rigidity.  9.  No Cyanosis, Normal Skin Turgor, No Skin Rash or Bruise.  10. Good muscle tone,  joints appear normal , no effusions, Normal ROM.  11. No Palpable Lymph Nodes in Neck or Axillae     Data Review  CBC  Recent Labs Lab 05/22/14 1130  WBC 3.9*  HGB 12.9  HCT 37.3  PLT 153  MCV 79.5  MCH 27.5  MCHC 34.6  RDW 15.2  LYMPHSABS 0.5*  MONOABS 0.4  EOSABS 0.1  BASOSABS 0.1   ------------------------------------------------------------------------------------------------------------------  Chemistries   Recent Labs Lab 05/22/14 1001 05/22/14 1130  NA 131* 132*  K 4.0 4.0  CL 98 95*  CO2 15* 13*  GLUCOSE 471* 679*  BUN 14 15  CREATININE 1.02 0.70  CALCIUM 9.2 9.3  AST 13  --   ALT 23  --   ALKPHOS 178*  --   BILITOT 0.6  --    ------------------------------------------------------------------------------------------------------------------ estimated creatinine clearance is 74.7 ml/min (by C-G formula based on Cr of 0.7). ------------------------------------------------------------------------------------------------------------------ No results found for this basename: TSH, T4TOTAL, FREET3, T3FREE, THYROIDAB,  in the last 72 hours   Coagulation profile No results found for this basename: INR, PROTIME,  in the last 168 hours ------------------------------------------------------------------------------------------------------------------- No results found for this basename: DDIMER,  in the last 72 hours -------------------------------------------------------------------------------------------------------------------  Cardiac Enzymes No results found for this basename: CK, CKMB, TROPONINI, MYOGLOBIN,  in the last 168  hours ------------------------------------------------------------------------------------------------------------------ No components found with this basename: POCBNP,    ---------------------------------------------------------------------------------------------------------------  Urinalysis    Component Value Date/Time   COLORURINE YELLOW 05/22/2014 1252   APPEARANCEUR CLEAR 05/22/2014 1252   LABSPEC 1.038* 05/22/2014 1252   PHURINE 5.0 05/22/2014 1252   GLUCOSEU >1000* 05/22/2014 1252   HGBUR NEGATIVE 05/22/2014 1252   BILIRUBINUR NEGATIVE 05/22/2014 1252   BILIRUBINUR neg 05/22/2014 0935   KETONESUR >80* 05/22/2014 1252   PROTEINUR NEGATIVE 05/22/2014 1252   PROTEINUR neg 05/22/2014 0935   UROBILINOGEN 0.2 05/22/2014 1252   UROBILINOGEN 0.2 05/22/2014 0935   NITRITE NEGATIVE 05/22/2014 1252   NITRITE neg 05/22/2014 0935   LEUKOCYTESUR NEGATIVE 05/22/2014 1252    ----------------------------------------------------------------------------------------------------------------  Imaging results:   Dg Abd Portable 1v  05/22/2014   CLINICAL DATA:  Hyperglycemia. One week history of dysuria, urinary frequency and thirst. Prior history of left urinary tract calculus.  EXAM: PORTABLE ABDOMEN - 1 VIEW  COMPARISON:  One view abdomen x-ray 04/15/2009, 02/03/2009, 12/27/2008. CT urogram 12/27/2008. [facility].  FINDINGS: Bowel gas pattern unremarkable without evidence of obstruction or significant ileus. Moderate stool burden in the colon. Calcified, degenerated fibroid in the left side of the pelvis as noted previously. Approximate 3 mm calcification projected over the expected location of the lower pole of the right kidney. No other visible opaque urinary tract calculi. Degenerative changes involving the lower lumbar spine.  IMPRESSION: 1. No acute abdominal abnormality. 2. 3 mm calcification projected over the lower pole of the right kidney, likely an intrarenal calculus. 3.  Calcified, degenerated uterine fibroid.   Electronically Signed   By: Hulan Saashomas  Lawrence M.D.   On: 05/22/2014 14:04         Assessment & Plan   1. DKA in a patient with newly diagnosed DM2 -  Will be admitted to the hospital, will check A1c, IV fluids bolus followed by maintenance, IV insulin per DKA protocol, will start her on low-dose Lantus tonight, will start diabetic education as she will most likely be discharged on insulin. Outpatient followup with ophthalmology for eye exam, his room allows in her blood pressure will place her on low-dose ACE inhibitor as well.    2. Depression and mood disorder. No acute issues continue home medications    3. Constipation. Started on bowel regimen.     DVT Prophylaxis Heparin   AM Labs Ordered, also please review Full Orders  Family Communication: Admission, patients condition and plan of care including tests being ordered have been discussed with the patient  who indicates understanding and agree with the plan and Code Status.  Code Status Full  Likely DC to  Home  Condition GUARDED     Time spent in minutes : 35    Ruven Corradi K M.D on 05/22/2014 at 6:36 PM  Between 7am to 7pm - Pager - [phone]  After 7pm go to [email or web address] - password [email or web address]  And look for the night coverage person covering me after hours  [facility] Office  [phone]   **Disclaimer: This note may have been dictated with voice recognition software. Similar sounding words can inadvertently be transcribed and this note may contain transcription errors which may not have been corrected upon publication of note.**

## 2014-05-22 NOTE — ED Provider Notes (Signed)
CSN: [id number]     Arrival date & time 05/22/14  1101 History   First MD Initiated Contact with Patient 05/22/14 1123     Chief Complaint  Patient presents with  . Hyperglycemia      HPI Patient here from urgent care for hyperglycemia, unable to be read at ucc, only complaint is dysuria and urinary frequency with thirst x 1 week, no hx of diabetes   Past Medical History  Diagnosis Date  . Mixed stress and urge urinary incontinence   . Schizophrenia   . Allergy    History reviewed. No pertinent past surgical history. Family History  Problem Relation Age of Onset  . Heart disease Mother   . Emphysema Paternal Grandfather   . Hypertension Father   . Stroke Father   . Heart disease Father    History  Substance Use Topics  . Smoking status: Former Smoker -- 3.00 packs/day    Types: Cigarettes  . Smokeless tobacco: Not on file  . Alcohol Use: 3.6 oz/week    6 Cans of beer per week   OB History   Grav Para Term Preterm Abortions TAB SAB Ect Mult Living   0              Review of Systems  All other systems reviewed and are negative   Allergies  Penicillins  Home Medications   Prior to Admission medications   Medication Sig Start Date End Date Taking? Authorizing Provider  ARIPiprazole (ABILIFY) 10 MG tablet Take 10 mg by mouth daily.    Historical Provider, MD  benztropine (COGENTIN) 0.5 MG tablet Take 0.5 mg by mouth 2 (two) times daily.     Historical Provider, MD  buPROPion (WELLBUTRIN XL) 150 MG 24 hr tablet Take 150 mg by mouth daily.    Historical Provider, MD   BP 96/42  Pulse 60  Temp(Src) 98.2 F (36.8 C) (Oral)  Resp 18  Ht 5\' 5"  (1.651 m)  Wt 165 lb (74.844 kg)  BMI 27.46 kg/m2  SpO2 100% Physical Exam  Nursing note and vitals reviewed. Constitutional: She is oriented to person, place, and time. She appears well-developed and well-nourished. No distress.  HENT:  Head: Normocephalic and atraumatic.  Eyes: Pupils are equal, round, and reactive to  light.  Neck: Normal range of motion.  Cardiovascular: Normal rate and intact distal pulses.   Pulmonary/Chest: Effort normal. No respiratory distress. She has no wheezes.  Abdominal: Normal appearance. She exhibits no distension. There is no tenderness. There is no rebound.  Musculoskeletal: Normal range of motion.  Neurological: She is alert and oriented to person, place, and time. No cranial nerve deficit.  Skin: Skin is warm and dry. No rash noted.  Psychiatric: She has a normal mood and affect. Her behavior is normal.    ED Course  Procedures (including critical care time)  CRITICAL CARE Performed by: Nelva NayBEATON,Consepcion Utt L Total critical care time: 30min Critical care time was exclusive of separately billable procedures and treating other patients. Critical care was necessary to treat or prevent imminent or life-threatening deterioration. Critical care was time spent personally by me on the following activities: development of treatment plan with patient and/or surrogate as well as nursing, discussions with consultants, evaluation of patient's response to treatment, examination of patient, obtaining history from patient or surrogate, ordering and performing treatments and interventions, ordering and review of laboratory studies, ordering and review of radiographic studies, pulse oximetry and re-evaluation of patient's condition.  Medications  ARIPiprazole (ABILIFY) tablet  10 mg (10 mg Oral Given 05/22/14 2155)  buPROPion (WELLBUTRIN XL) 24 hr tablet 150 mg (150 mg Oral Given 05/22/14 2155)  benztropine (COGENTIN) tablet 0.5 mg (0.5 mg Oral Given 05/22/14 2155)  ondansetron (ZOFRAN) injection 4 mg (not administered)  docusate sodium (COLACE) capsule 200 mg (200 mg Oral Given 05/22/14 2208)  polyethylene glycol (MIRALAX / GLYCOLAX) packet 17 g (17 g Oral Given 05/22/14 2154)  bisacodyl (DULCOLAX) suppository 10 mg (not administered)  alum & mag hydroxide-simeth (MAALOX/MYLANTA) 200-200-20 MG/5ML  suspension 30 mL (not administered)  HYDROcodone-acetaminophen (NORCO/VICODIN) 5-325 MG per tablet 1 tablet (not administered)  albuterol (PROVENTIL) (2.5 MG/3ML) 0.083% nebulizer solution 2.5 mg (not administered)  dextrose 5 %-0.45 % sodium chloride infusion (not administered)  insulin regular (NOVOLIN R,HUMULIN R) 1 Units/mL in sodium chloride 0.9 % 100 mL infusion (not administered)  dextrose 50 % solution 25 mL (not administered)  heparin injection 5,000 Units (5,000 Units Subcutaneous Given 05/23/14 0655)  insulin glargine (LANTUS) injection 15 Units (15 Units Subcutaneous Given 05/22/14 2207)  potassium chloride SA (K-DUR,KLOR-CON) CR tablet 40 mEq (40 mEq Oral Given 05/23/14 0704)  sodium chloride 0.9 % bolus 1,000 mL (0 mLs Intravenous Stopped 05/22/14 1239)  sodium chloride 0.9 % bolus 1,000 mL (0 mLs Intravenous Stopped 05/22/14 1416)  insulin regular (NOVOLIN R,HUMULIN R) 100 units/mL injection 10 Units (10 Units Subcutaneous Given 05/22/14 1435)    Labs Review Labs Reviewed  CBC WITH DIFFERENTIAL - Abnormal; Notable for the following:    WBC 3.9 (*)    Basophils Relative 2 (*)    Lymphs Abs 0.5 (*)    All other components within normal limits  BASIC METABOLIC PANEL - Abnormal; Notable for the following:    Sodium 132 (*)    Chloride 95 (*)    CO2 13 (*)    Glucose, Bld 679 (*)    Anion gap >20 (*)    All other components within normal limits  URINALYSIS, ROUTINE W REFLEX MICROSCOPIC - Abnormal; Notable for the following:    Specific Gravity, Urine 1.038 (*)    Glucose, UA >1000 (*)    Ketones, ur >80 (*)    All other components within normal limits  URINE MICROSCOPIC-ADD ON - Abnormal; Notable for the following:    Bacteria, UA FEW (*)    All other components within normal limits  CBC - Abnormal; Notable for the following:    WBC 3.5 (*)    Hemoglobin 11.2 (*)    HCT 32.1 (*)    Platelets 123 (*)    All other components within normal limits  BASIC METABOLIC PANEL -  Abnormal; Notable for the following:    CO2 13 (*)    Glucose, Bld 323 (*)    Anion gap 20 (*)    All other components within normal limits  BASIC METABOLIC PANEL - Abnormal; Notable for the following:    CO2 15 (*)    Glucose, Bld 331 (*)    Calcium 8.3 (*)    GFR calc non Af Amer 89 (*)    Anion gap 18 (*)    All other components within normal limits  BASIC METABOLIC PANEL - Abnormal; Notable for the following:    Potassium 3.1 (*)    CO2 16 (*)    Glucose, Bld 197 (*)    All other components within normal limits  GLUCOSE, CAPILLARY - Abnormal; Notable for the following:    Glucose-Capillary 337 (*)    All other components within normal  limits  GLUCOSE, CAPILLARY - Abnormal; Notable for the following:    Glucose-Capillary 323 (*)    All other components within normal limits  GLUCOSE, CAPILLARY - Abnormal; Notable for the following:    Glucose-Capillary 306 (*)    All other components within normal limits  GLUCOSE, CAPILLARY - Abnormal; Notable for the following:    Glucose-Capillary 328 (*)    All other components within normal limits  GLUCOSE, CAPILLARY - Abnormal; Notable for the following:    Glucose-Capillary 307 (*)    All other components within normal limits  GLUCOSE, CAPILLARY - Abnormal; Notable for the following:    Glucose-Capillary 265 (*)    All other components within normal limits  GLUCOSE, CAPILLARY - Abnormal; Notable for the following:    Glucose-Capillary 220 (*)    All other components within normal limits  GLUCOSE, CAPILLARY - Abnormal; Notable for the following:    Glucose-Capillary 210 (*)    All other components within normal limits  GLUCOSE, CAPILLARY - Abnormal; Notable for the following:    Glucose-Capillary 187 (*)    All other components within normal limits  GLUCOSE, CAPILLARY - Abnormal; Notable for the following:    Glucose-Capillary 175 (*)    All other components within normal limits  GLUCOSE, CAPILLARY - Abnormal; Notable for the  following:    Glucose-Capillary 155 (*)    All other components within normal limits  GLUCOSE, CAPILLARY - Abnormal; Notable for the following:    Glucose-Capillary 159 (*)    All other components within normal limits  CBG MONITORING, ED - Abnormal; Notable for the following:    Glucose-Capillary >600 (*)    All other components within normal limits  I-STAT VENOUS BLOOD GAS, ED - Abnormal; Notable for the following:    pH, Ven 7.210 (*)    pCO2, Ven 37.2 (*)    pO2, Ven 18.0 (*)    Bicarbonate 14.9 (*)    Acid-base deficit 12.0 (*)    All other components within normal limits  CBG MONITORING, ED - Abnormal; Notable for the following:    Glucose-Capillary 390 (*)    All other components within normal limits  CBG MONITORING, ED - Abnormal; Notable for the following:    Glucose-Capillary 369 (*)    All other components within normal limits  BLOOD GAS, VENOUS  HEMOGLOBIN A1C  BASIC METABOLIC PANEL  MAGNESIUM  POTASSIUM    Imaging Review Dg Abd Portable 1v  05/22/2014   CLINICAL DATA:  Hyperglycemia. One week history of dysuria, urinary frequency and thirst. Prior history of left urinary tract calculus.  EXAM: PORTABLE ABDOMEN - 1 VIEW  COMPARISON:  One view abdomen x-ray 04/15/2009, 02/03/2009, 12/27/2008. CT urogram 12/27/2008. All prior imaging at [facility].  FINDINGS: Bowel gas pattern unremarkable without evidence of obstruction or significant ileus. Moderate stool burden in the colon. Calcified, degenerated fibroid in the left side of the pelvis as noted previously. Approximate 3 mm calcification projected over the expected location of the lower pole of the right kidney. No other visible opaque urinary tract calculi. Degenerative changes involving the lower lumbar spine.  IMPRESSION: 1. No acute abdominal abnormality. 2. 3 mm calcification projected over the lower pole of the right kidney, likely an intrarenal calculus. 3. Calcified, degenerated uterine fibroid.    Electronically Signed   By: Hulan Saas M.D.   On: 05/22/2014 14:04      MDM   Final diagnoses:  Type II or unspecified type diabetes mellitus with unspecified  complication, uncontrolled  Constipation, unspecified constipation type  Diabetic ketoacidosis without coma associated with type 2 diabetes mellitus  Mood disorder        Nelia Shiobert L Cailee Blanke, MD 05/23/14 [phone]

## 2014-05-23 LAB — BASIC METABOLIC PANEL
Anion gap: 15 (ref 5–15)
Anion gap: 13 (ref 5–15)
BUN: 12 mg/dL (ref 6–23)
BUN: 11 mg/dL (ref 6–23)
CALCIUM: 8.4 mg/dL (ref 8.4–10.5)
CHLORIDE: 106 meq/L (ref 96–112)
CHLORIDE: 107 meq/L (ref 96–112)
CO2: 16 meq/L — AB (ref 19–32)
CO2: 17 mEq/L — ABNORMAL LOW (ref 19–32)
CREATININE: 0.51 mg/dL (ref 0.50–1.10)
Calcium: 8.4 mg/dL (ref 8.4–10.5)
Creatinine, Ser: 0.53 mg/dL (ref 0.50–1.10)
GFR calc Af Amer: >90 mL/min (ref >90)
GFR calc Af Amer: >90 mL/min (ref >90)
GFR calc non Af Amer: >90 mL/min (ref >90)
GFR calc non Af Amer: >90 mL/min (ref >90)
GLUCOSE: 170 mg/dL — AB (ref 70–99)
Glucose, Bld: 197 mg/dL — ABNORMAL HIGH (ref 70–99)
POTASSIUM: 3.1 meq/L — AB (ref 3.7–5.3)
Potassium: 3.4 mEq/L — ABNORMAL LOW (ref 3.7–5.3)
Sodium: 137 mEq/L (ref 137–147)
Sodium: 137 mEq/L (ref 137–147)

## 2014-05-23 LAB — GLUCOSE, CAPILLARY
GLUCOSE-CAPILLARY: 265 mg/dL — AB (ref 70–99)
GLUCOSE-CAPILLARY: 220 mg/dL — AB (ref 70–99)
GLUCOSE-CAPILLARY: 135 mg/dL — AB (ref 70–99)
GLUCOSE-CAPILLARY: 268 mg/dL — AB (ref 70–99)
Glucose-Capillary: 210 mg/dL — ABNORMAL HIGH (ref 70–99)
Glucose-Capillary: 187 mg/dL — ABNORMAL HIGH (ref 70–99)
Glucose-Capillary: 175 mg/dL — ABNORMAL HIGH (ref 70–99)
Glucose-Capillary: 155 mg/dL — ABNORMAL HIGH (ref 70–99)
Glucose-Capillary: 159 mg/dL — ABNORMAL HIGH (ref 70–99)
Glucose-Capillary: 189 mg/dL — ABNORMAL HIGH (ref 70–99)
Glucose-Capillary: 172 mg/dL — ABNORMAL HIGH (ref 70–99)
Glucose-Capillary: 265 mg/dL — ABNORMAL HIGH (ref 70–99)

## 2014-05-23 LAB — CBC
HEMATOCRIT: 32.1 % — AB (ref 36.0–46.0)
HEMOGLOBIN: 11.2 g/dL — AB (ref 12.0–15.0)
MCH: 27.3 pg (ref 26.0–34.0)
MCHC: 34.9 g/dL (ref 30.0–36.0)
MCV: 78.1 fL (ref 78.0–100.0)
Platelets: 123 10*3/uL — ABNORMAL LOW (ref 150–400)
RBC: 4.11 MIL/uL (ref 3.87–5.11)
RDW: 15.1 % (ref 11.5–15.5)
WBC: 3.5 10*3/uL — AB (ref 4.0–10.5)

## 2014-05-23 LAB — HEMOGLOBIN A1C
Hgb A1c MFr Bld: 12.8 % — ABNORMAL HIGH (ref <5.7)
MEAN PLASMA GLUCOSE: 321 mg/dL — AB (ref <117)

## 2014-05-23 LAB — MAGNESIUM: MAGNESIUM: 1.9 mg/dL (ref 1.5–2.5)

## 2014-05-23 LAB — POTASSIUM: POTASSIUM: 4.3 meq/L (ref 3.7–5.3)

## 2014-05-23 LAB — URINE CULTURE
Colony Count: NO GROWTH
Organism ID, Bacteria: NO GROWTH

## 2014-05-23 MED ORDER — INSULIN STARTER KIT- SYRINGES (ENGLISH)
1.0000 | Freq: Once | Status: AC
  Administered 2014-05-23: 1
  Filled 2014-05-23 (×2): qty 1

## 2014-05-23 MED ORDER — INSULIN GLARGINE 100 UNIT/ML ~~LOC~~ SOLN
15.0000 [IU] | Freq: Every day | SUBCUTANEOUS | Status: DC
  Administered 2014-05-23: 15 [IU] via SUBCUTANEOUS
  Filled 2014-05-23 (×2): qty 0.15

## 2014-05-23 MED ORDER — INSULIN ASPART 100 UNIT/ML ~~LOC~~ SOLN
0.0000 [IU] | Freq: Every day | SUBCUTANEOUS | Status: DC
  Administered 2014-05-23: 3 [IU] via SUBCUTANEOUS

## 2014-05-23 MED ORDER — LIVING WELL WITH DIABETES BOOK
Freq: Once | Status: AC
  Administered 2014-05-23: 16:00:00
  Filled 2014-05-23: qty 1

## 2014-05-23 MED ORDER — INSULIN ASPART 100 UNIT/ML ~~LOC~~ SOLN
0.0000 [IU] | Freq: Three times a day (TID) | SUBCUTANEOUS | Status: DC
  Administered 2014-05-23: 1 [IU] via SUBCUTANEOUS
  Administered 2014-05-23: 5 [IU] via SUBCUTANEOUS
  Administered 2014-05-24: 3 [IU] via SUBCUTANEOUS
  Administered 2014-05-24: 5 [IU] via SUBCUTANEOUS

## 2014-05-23 MED ORDER — METFORMIN HCL 500 MG PO TABS
500.0000 mg | ORAL_TABLET | Freq: Two times a day (BID) | ORAL | Status: DC
  Administered 2014-05-23 – 2014-05-24 (×3): 500 mg via ORAL
  Filled 2014-05-23 (×5): qty 1

## 2014-05-23 MED ORDER — POTASSIUM CHLORIDE CRYS ER 20 MEQ PO TBCR
40.0000 meq | EXTENDED_RELEASE_TABLET | ORAL | Status: AC
  Administered 2014-05-23 (×2): 40 meq via ORAL
  Filled 2014-05-23 (×2): qty 2

## 2014-05-23 MED ORDER — SODIUM CHLORIDE 0.9 % IV BOLUS (SEPSIS)
500.0000 mL | Freq: Once | INTRAVENOUS | Status: AC
  Administered 2014-05-23: 500 mL via INTRAVENOUS

## 2014-05-23 NOTE — Care Management Note (Signed)
    Page 1 of 1   05/23/2014     3:43:21 PM CARE MANAGEMENT NOTE 05/23/2014  [name]   Account Number:  [id number]  Date Initiated:  05/23/2014  Documentation initiated by:  [facility]FFRI[facility],Tawan Corkern  Subjective/Objective Assessment:   adm:  complaints of frequency in urination, excessive thirst and hunger for the last few weeks; New Onset DM     Action/Plan:   discharge planning   Anticipated DC Date:  05/23/2014   Anticipated DC Plan:  HOME/SELF CARE      DC Planning Services  CM consult      Choice offered to / List presented to:             Status of service:  Completed, signed off Medicare Important Message given?   (If response is "NO", the following Medicare IM given date fields will be blank) Date Medicare IM given:   Medicare IM given by:   Date Additional Medicare IM given:   Additional Medicare IM given by:    Discharge Disposition:  HOME/SELF CARE  Per UR Regulation:    If discussed at Long Length of Stay Meetings, dates discussed:    Comments:  05/23/14 15:35 CM received call from RN to ask for med asst. CM spoke with pt in room to ask why she did not opt for Zamora medication plan with her Medicare Part Zamora and B.  Sadly, she thought she had Zamora better plan.  CM gave [facility] and suggested she call and ask for an appointment with the navigator to see what her insurance options could be. Pt states she will also pursue getting Zamora PCP (Resource List given to pt).  CM gave pt vouchers for insulin (RN calling MD to change Lantus to 70/30).  CM gave pt handout for [employer] as they have discounted/free oral medications available.  CM gave pt handout from [facility] for Zamora ReLion Glucometer and explained it is better to pay Zamora little more bc strips are included.  Also, [employer] and [employer] now have discounted diabetic supplies for their members and pt states she will look into this bc the membership for the year can outwiegh med cost.  No other CM needs were  communicated.  [name], BSN, CM [phone].

## 2014-05-23 NOTE — Progress Notes (Signed)
Patient Demographics  [name], is a 63 y.o. female, DOB - 1951/08/11, ZOX:[id number]  Admit date - 05/22/2014   Admitting Physician Hollice Espy, MD  Outpatient Primary MD for the patient is Pcp Not In System  LOS - 1   Chief Complaint  Patient presents with  . Hyperglycemia        Subjective:   [name] today has, No headache, No chest pain, No abdominal pain - No Nausea, No new weakness tingling or numbness, No Cough - SOB.      Assessment & Plan    1. DKA in a patient with newly diagnosed DM 2-   A1c 12.2, after insulin drip and IV fluids gap has closed, she is already on Lantus which will be continued, will add Glucophage and sliding scale insulin. Diabetic educator consulted.    2. Depression and mood disorder. No acute issues continue home medications    3. Constipation. Started on bowel regimen. Improved.   4. Low potassium. Replace and monitor with magnesium levels.     Code Status: Full  Family Communication: None present  Disposition Plan: Home   Procedures     Consults DM educator   Medications  Scheduled Meds: . ARIPiprazole  10 mg Oral Daily  . benztropine  0.5 mg Oral BID  . bisacodyl  10 mg Rectal Daily  . buPROPion  150 mg Oral Daily  . docusate sodium  200 mg Oral BID  . heparin  5,000 Units Subcutaneous 3 times per day  . insulin aspart  0-5 Units Subcutaneous QHS  . insulin aspart  0-9 Units Subcutaneous TID WC  . insulin glargine  15 Units Subcutaneous Daily  . metFORMIN  500 mg Oral BID WC  . polyethylene glycol  17 g Oral Daily  . potassium chloride  40 mEq Oral Q4H  . sodium chloride  500 mL Intravenous Once   Continuous Infusions:  PRN Meds:.albuterol, alum & mag hydroxide-simeth, dextrose,  HYDROcodone-acetaminophen, ondansetron (ZOFRAN) IV  DVT Prophylaxis  Heparin    Lab Results  Component Value Date   PLT 123* 05/23/2014    Antibiotics    Anti-infectives   None          Objective:   Filed Vitals:   05/22/14 1647 05/22/14 1806 05/22/14 2009 05/23/14 0524  BP: 126/64 122/64 93/76 96/42   Pulse: 58 58 60 60  Temp: 98.7 F (37.1 C) 98.1 F (36.7 C) 99.2 F (37.3 C) 98.2 F (36.8 C)  TempSrc:  Oral Oral Oral  Resp: 17 17 18 18   Height:      Weight:      SpO2: 98% 100% 99% 100%    Wt Readings from Last 3 Encounters:  05/22/14 74.844 kg (165 lb)  05/22/14 73.936 kg (163 lb)  12/18/12 84.823 kg (187 lb)     Intake/Output Summary (Last 24 hours) at 05/23/14 0949 Last data filed at 05/23/14 0800  Gross per 24 hour  Intake   3940 ml  Output      4 ml  Net   3936 ml     Physical Exam  Awake Alert, Oriented X 3, No new F.N deficits, Normal affect [name].AT,PERRAL Supple Neck,No JVD, No cervical lymphadenopathy appriciated.  Symmetrical Chest wall  movement, Good air movement bilaterally, CTAB RRR,No Gallops,Rubs or new Murmurs, No Parasternal Heave +ve B.Sounds, Abd Soft, No tenderness, No organomegaly appriciated, No rebound - guarding or rigidity. No Cyanosis, Clubbing or edema, No new Rash or bruise     Data Review   Micro Results Recent Results (from the past 240 hour(s))  URINE CULTURE     Status: None   Collection Time    05/22/14 10:02 AM      Result Value Ref Range Status   Colony Count NO GROWTH   Final   Organism ID, Bacteria NO GROWTH   Final    Radiology Reports Dg Abd Portable 1v  05/22/2014   CLINICAL DATA:  Hyperglycemia. One week history of dysuria, urinary frequency and thirst. Prior history of left urinary tract calculus.  EXAM: PORTABLE ABDOMEN - 1 VIEW  COMPARISON:  One view abdomen x-ray 04/15/2009, 02/03/2009, 12/27/2008. CT urogram 12/27/2008. [facility].  FINDINGS: Bowel gas pattern  unremarkable without evidence of obstruction or significant ileus. Moderate stool burden in the colon. Calcified, degenerated fibroid in the left side of the pelvis as noted previously. Approximate 3 mm calcification projected over the expected location of the lower pole of the right kidney. No other visible opaque urinary tract calculi. Degenerative changes involving the lower lumbar spine.  IMPRESSION: 1. No acute abdominal abnormality. 2. 3 mm calcification projected over the lower pole of the right kidney, likely an intrarenal calculus. 3. Calcified, degenerated uterine fibroid.   Electronically Signed   By: Hulan Saashomas  Lawrence M.D.   On: 05/22/2014 14:04    CBC  Recent Labs Lab 05/22/14 1130 05/23/14 0340  WBC 3.9* 3.5*  HGB 12.9 11.2*  HCT 37.3 32.1*  PLT 153 123*  MCV 79.5 78.1  MCH 27.5 27.3  MCHC 34.6 34.9  RDW 15.2 15.1  LYMPHSABS 0.5*  --   MONOABS 0.4  --   EOSABS 0.1  --   BASOSABS 0.1  --     Chemistries   Recent Labs Lab 05/22/14 1001 05/22/14 1130 05/22/14 1914 05/22/14 2205 05/23/14 0340 05/23/14 0755  NA 131* 132* 137 137 137 137  K 4.0 4.0 3.7 3.7 3.1* 3.4*  CL 98 95* 104 104 106 107  CO2 15* 13* 13* 15* 16* 17*  GLUCOSE 471* 679* 323* 331* 197* 170*  BUN 14 15 11 11 12 11   CREATININE 1.02 0.70 0.51 0.77 0.51 0.53  CALCIUM 9.2 9.3 8.4 8.3* 8.4 8.4  AST 13  --   --   --   --   --   ALT 23  --   --   --   --   --   ALKPHOS 178*  --   --   --   --   --   BILITOT 0.6  --   --   --   --   --    ------------------------------------------------------------------------------------------------------------------ estimated creatinine clearance is 74.7 ml/min (by C-G formula based on Cr of 0.53). ------------------------------------------------------------------------------------------------------------------  Recent Labs  05/22/14 1006  HGBA1C 12.2    ------------------------------------------------------------------------------------------------------------------ No results found for this basename: CHOL, HDL, LDLCALC, TRIG, CHOLHDL, LDLDIRECT,  in the last 72 hours ------------------------------------------------------------------------------------------------------------------ No results found for this basename: TSH, T4TOTAL, FREET3, T3FREE, THYROIDAB,  in the last 72 hours ------------------------------------------------------------------------------------------------------------------ No results found for this basename: VITAMINB12, FOLATE, FERRITIN, TIBC, IRON, RETICCTPCT,  in the last 72 hours  Coagulation profile No results found for this basename: INR, PROTIME,  in the last 168  hours  No results found for this basename: DDIMER,  in the last 72 hours  Cardiac Enzymes No results found for this basename: CK, CKMB, TROPONINI, MYOGLOBIN,  in the last 168 hours ------------------------------------------------------------------------------------------------------------------ No components found with this basename: POCBNP,      Time Spent in minutes   35   SINGH,PRASHANT K M.D on 05/23/2014 at 9:49 AM  Between 7am to 7pm - Pager - [phone]  After 7pm go to [email or web address] - password [email or web address]  And look for the night coverage person covering for me after hours  [facility] Office  [phone]   **Disclaimer: This note may have been dictated with voice recognition software. Similar sounding words can inadvertently be transcribed and this note may contain transcription errors which may not have been corrected upon publication of note.**

## 2014-05-23 NOTE — Progress Notes (Signed)
Inpatient Diabetes Program Recommendations  AACE/ADA: New Consensus Statement on Inpatient Glycemic Control (2013)  Target Ranges:  Prepandial:   less than 140 mg/dL      Peak postprandial:   less than 180 mg/dL (1-2 hours)      Critically ill patients:  140 - 180 mg/dL   Reason for Visit: Diabetes Consult  Diabetes history: None - New onset Outpatient Diabetes medications: N/A Current orders for Inpatient glycemic control: Lantus 15 units QAM, Novolog sensitive tidwc and metformin 500 bid  63 y.o. female, with history of schizophrenia and mood disorder otherwise healthy presented to a local urgent care with chief complaints of frequency in urination, excessive thirst and hunger for the last few weeks, at the urgent care she was noted to have high blood sugars and transferred to [facility], at [place] she was diagnosed with DKA and transferred to [facility] for further care. IV insulin per DKA protocol started and when criteria met, was transitioned to Lantus and Novolog. Pt to go home on insulin and insulin starter kit and Living Well With Diabetes book ordered. RN to begin teaching insulin administration.  Talked with pt on phone regarding new diagnosis of DM, insulin administration, hypoglycemia s/s and treatment, diet, exercise and stress and how these affect blood sugar control. Discussed importance of monitoring blood sugars and f/u with PCP for management of DM. Pt voiced understanding. Will order OP Diabetes Education consult and they will contact pt after discharge to set appt. Discussed above with RN. Will f/u in am with pt if still inpatient.  Thank you. Lorenda Peck, RD, LDN, CDE Inpatient Diabetes Coordinator [phone]

## 2014-05-24 LAB — GLUCOSE, CAPILLARY
GLUCOSE-CAPILLARY: 245 mg/dL — AB (ref 70–99)
Glucose-Capillary: 293 mg/dL — ABNORMAL HIGH (ref 70–99)

## 2014-05-24 MED ORDER — INSULIN NPH ISOPHANE & REGULAR (70-30) 100 UNIT/ML ~~LOC~~ SUSP: 20.0000 [IU] | Freq: Two times a day (BID) | SUBCUTANEOUS | Status: DC

## 2014-05-24 MED ORDER — INSULIN ASPART 100 UNIT/ML ~~LOC~~ SOLN: SUBCUTANEOUS | Status: DC

## 2014-05-24 MED ORDER — FREESTYLE SYSTEM KIT: 1.0000 | PACK | Freq: Three times a day (TID) | Status: DC

## 2014-05-24 MED ORDER — INSULIN GLARGINE 100 UNIT/ML ~~LOC~~ SOLN: 25.0000 [IU] | Freq: Every day | SUBCUTANEOUS | Status: DC

## 2014-05-24 MED ORDER — METFORMIN HCL 500 MG PO TABS: 500.0000 mg | ORAL_TABLET | Freq: Two times a day (BID) | ORAL | Status: DC

## 2014-05-24 MED ORDER — LISINOPRIL 5 MG PO TABS: 5.0000 mg | ORAL_TABLET | Freq: Every day | ORAL | Status: DC

## 2014-05-24 MED ORDER — INSULIN GLARGINE 100 UNIT/ML ~~LOC~~ SOLN
25.0000 [IU] | Freq: Every day | SUBCUTANEOUS | Status: DC
  Administered 2014-05-24: 25 [IU] via SUBCUTANEOUS
  Filled 2014-05-24: qty 0.25

## 2014-05-24 NOTE — Discharge Instructions (Signed)
Follow with Primary MD in 7 days   Get CBC, CMP, 2 view Chest X ray checked  by Primary MD next visit.    Activity: As tolerated with Full fall precautions use walker/cane & assistance as needed   Disposition Home     Diet: Heart Healthy Low carb   Accuchecks 4 times/day, Once in AM empty stomach and then before each meal. Log in all results and show them to your Prim.MD in 3 days. If any glucose reading is under 80 or above 300 call your Prim MD immidiately. Follow Low glucose instructions for glucose under 80 as instructed.    On your next visit with her primary care physician please Get Medicines reviewed and adjusted.  Please request your Prim.MD to go over all Hospital Tests and Procedure/Radiological results at the follow up, please get all Hospital records sent to your Prim MD by signing hospital release before you go home.   If you experience worsening of your admission symptoms, develop shortness of breath, life threatening emergency, suicidal or homicidal thoughts you must seek medical attention immediately by calling 911 or calling your MD immediately  if symptoms less severe.  You Must read complete instructions/literature along with all the possible adverse reactions/side effects for all the Medicines you take and that have been prescribed to you. Take any new Medicines after you have completely understood and accpet all the possible adverse reactions/side effects.   Do not drive, operating heavy machinery, perform activities at heights, swimming or participation in water activities or provide baby sitting services if your were admitted for syncope or siezures until you have seen by Primary MD or a Neurologist and advised to do so again.  Do not drive when taking Pain medications.    Do not take more than prescribed Pain, Sleep and Anxiety Medications  Special Instructions: If you have smoked or chewed Tobacco  in the last 2 yrs please stop smoking, stop any regular  Alcohol  and or any Recreational drug use.  Wear Seat belts while driving.   Please note  You were cared for by a hospitalist during your hospital stay. If you have any questions about your discharge medications or the care you received while you were in the hospital after you are discharged, you can call the unit and asked to speak with the hospitalist on call if the hospitalist that took care of you is not available. Once you are discharged, your primary care physician will handle any further medical issues. Please note that NO REFILLS for any discharge medications will be authorized once you are discharged, as it is imperative that you return to your primary care physician (or establish a relationship with a primary care physician if you do not have one) for your aftercare needs so that they can reassess your need for medications and monitor your lab values.

## 2014-05-24 NOTE — Discharge Summary (Addendum)
[name], is a 63 y.o. female  DOB [birth year]  MRN [id number].  Admission date:  05/22/2014  Admitting Physician  Annita Brod, MD  Discharge Date:  05/24/2014   Primary MD  No PCP Per Patient  Recommendations for primary care physician for things to follow:   Monitor glycemic control closely   Admission Diagnosis  Type II or unspecified type diabetes mellitus with unspecified complication, uncontrolled [250.92] Constipation, unspecified constipation type [564.00] Diabetic ketoacidosis without coma associated with type 2 diabetes mellitus [250.12]   Discharge Diagnosis  Type II or unspecified type diabetes mellitus with unspecified complication, uncontrolled [250.92] Constipation, unspecified constipation type [564.00] Diabetic ketoacidosis without coma associated with type 2 diabetes mellitus [250.12]    Principal Problem:   DKA (diabetic ketoacidoses) Active Problems:   Type II or unspecified type diabetes mellitus with unspecified complication, uncontrolled   Constipation   Mood disorder      Past Medical History  Diagnosis Date  . Mixed stress and urge urinary incontinence   . Schizophrenia   . Allergy     History reviewed. No pertinent past surgical history.     History of present illness and  Hospital Course:     Kindly see H&P for history of present illness and admission details, please review complete Labs, Consult reports and Test reports for all details in brief  HPI  [name] is a 63 y.o. female, with history of schizophrenia and mood disorder otherwise healthy presented to a local urgent care with chief complaints of frequency in urination, excessive thirst and hunger for the last few weeks, at the urgent care she was noted to have high blood sugars and transferred to [facility], at [place] she was diagnosed with DKA and transferred to [facility] for further care.   Patient denies any fever chills, no headache, no new vision or hearing, no chest pain cough phlegm shortness of breath, no abdominal pain, no dysuria but frequency in urination, no diarrhea, no blood in stool or urine, no focal weakness. She does have some generalized weakness and fatigue. No previous history of diabetes mellitus, no family history of DM either, she quit smoking one year ago, drinks 1 can of beer every 1-2 days.    Hospital Course    DKA in a patient with new diagnosis of type 2 diabetes mellitus. A1c was 12.2, initially was treated with DKA protocol which included IV fluids and IV insulin, anion gap closed, now transitioned to 7030 insulin along with Glucophage and testing supplies, has received diabetic education. Case management to assist with any medication needs. Will follow at [place] [facility] wellness clinic. Will place on low-dose lisinopril, we'll request PCP once established to arrange for outpatient ophthalmology followup.    History of depression and mood disorder. No acute issues at baseline, not suicidal homicidal, continue home medications unchanged.      Discharge Condition: stable   Follow UP  Follow-up Information   Follow up with [facility]    . (call for an appt for new onset diabetes and to get a primary care physician)    Contact information:   [address] [place] [phone]        Discharge Instructions  and  Discharge Medications          Discharge Instructions   Ambulatory referral to Nutrition and Diabetic Education    Complete by:  As directed      Diet - low sodium heart healthy    Complete by:  As directed      Discharge instructions    Complete by:  As directed   Follow with Primary MD in 7 days   Get CBC, CMP, 2 view Chest X ray checked  by Primary MD next  visit.    Activity: As tolerated with Full fall precautions use walker/cane & assistance as needed   Disposition Home     Diet: Heart Healthy Low carb   Accuchecks 4 times/day, Once in AM empty stomach and then before each meal. Log in all results and show them to your Prim.MD in 3 days. If any glucose reading is under 80 or above 300 call your Prim MD immidiately. Follow Low glucose instructions for glucose under 80 as instructed.    On your next visit with her primary care physician please Get Medicines reviewed and adjusted.  Please request your Prim.MD to go over all Hospital Tests and Procedure/Radiological results at the follow up, please get all Hospital records sent to your Prim MD by signing hospital release before you go home.   If you experience worsening of your admission symptoms, develop shortness of breath, life threatening emergency, suicidal or homicidal thoughts you must seek medical attention immediately by calling 911 or calling your MD immediately  if symptoms less severe.  You Must read complete instructions/literature along with all the possible adverse reactions/side effects for all the Medicines you take and that have been prescribed to you. Take any new Medicines after you have completely understood and accpet all the possible adverse reactions/side effects.   Do not drive, operating heavy machinery, perform activities at heights, swimming or participation in water activities or provide baby sitting services if your were admitted for syncope or siezures until you have seen by Primary MD or a Neurologist and advised to do so again.  Do not drive when taking Pain medications.    Do not take more than prescribed Pain, Sleep and Anxiety Medications  Special Instructions: If you have smoked or chewed Tobacco  in the last 2 yrs please stop smoking, stop any regular Alcohol  and or any Recreational drug use.  Wear Seat belts while driving.   Please  note  You were cared for by a hospitalist during your hospital stay. If you have any questions about your discharge medications or the care you received while you were in the hospital after you are discharged, you can call the unit and asked to speak with the hospitalist on call if the hospitalist that took care of you is not available. Once you are discharged, your primary care physician will handle any further medical issues. Please note that NO REFILLS for any discharge medications will be authorized once you are discharged, as it is imperative that you return to your primary care physician (or establish a relationship with a primary care physician if you do not have one) for your aftercare needs so that they can reassess your need for medications  and monitor your lab values.  Follow with Primary MD in 7 days   Get CBC, CMP, 2 view Chest X ray checked  by Primary MD next visit.    Activity: As tolerated with Full fall precautions use walker/cane & assistance as needed   Disposition Home     Diet: Heart Healthy Low carb   Accuchecks 4 times/day, Once in AM empty stomach and then before each meal. Log in all results and show them to your Prim.MD in 3 days. If any glucose reading is under 80 or above 300 call your Prim MD immidiately. Follow Low glucose instructions for glucose under 80 as instructed.    On your next visit with her primary care physician please Get Medicines reviewed and adjusted.  Please request your Prim.MD to go over all Hospital Tests and Procedure/Radiological results at the follow up, please get all Hospital records sent to your Prim MD by signing hospital release before you go home.   If you experience worsening of your admission symptoms, develop shortness of breath, life threatening emergency, suicidal or homicidal thoughts you must seek medical attention immediately by calling 911 or calling your MD immediately  if symptoms less severe.  You Must read  complete instructions/literature along with all the possible adverse reactions/side effects for all the Medicines you take and that have been prescribed to you. Take any new Medicines after you have completely understood and accpet all the possible adverse reactions/side effects.   Do not drive, operating heavy machinery, perform activities at heights, swimming or participation in water activities or provide baby sitting services if your were admitted for syncope or siezures until you have seen by Primary MD or a Neurologist and advised to do so again.  Do not drive when taking Pain medications.    Do not take more than prescribed Pain, Sleep and Anxiety Medications  Special Instructions: If you have smoked or chewed Tobacco  in the last 2 yrs please stop smoking, stop any regular Alcohol  and or any Recreational drug use.  Wear Seat belts while driving.   Please note  You were cared for by a hospitalist during your hospital stay. If you have any questions about your discharge medications or the care you received while you were in the hospital after you are discharged, you can call the unit and asked to speak with the hospitalist on call if the hospitalist that took care of you is not available. Once you are discharged, your primary care physician will handle any further medical issues. Please note that NO REFILLS for any discharge medications will be authorized once you are discharged, as it is imperative that you return to your primary care physician (or establish a relationship with a primary care physician if you do not have one) for your aftercare needs so that they can reassess your need for medications and monitor your lab values.     Increase activity slowly    Complete by:  As directed             Medication List         ARIPiprazole 10 MG tablet  Commonly known as:  ABILIFY  Take 10 mg by mouth daily.     benztropine 0.5 MG tablet  Commonly known as:  COGENTIN  Take 0.5 mg  by mouth 2 (two) times daily.     buPROPion 150 MG 24 hr tablet  Commonly known as:  WELLBUTRIN XL  Take 150 mg by mouth daily.  fluticasone 50 MCG/ACT nasal spray  Commonly known as:  FLONASE  Place 1 spray into both nostrils daily.     glucose monitoring kit monitoring kit  1 each by Does not apply route 4 (four) times daily - after meals and at bedtime. 1 month Diabetic Testing Supplies for QAC-QHS accuchecks.Any brand OK     insulin NPH-regular Human (70-30) 100 UNIT/ML injection  Commonly known as:  NOVOLIN 70/30  Inject 20 Units into the skin 2 (two) times daily with a meal.     lisinopril 5 MG tablet  Commonly known as:  PRINIVIL,ZESTRIL  Take 1 tablet (5 mg total) by mouth daily.     metFORMIN 500 MG tablet  Commonly known as:  GLUCOPHAGE  Take 1 tablet (500 mg total) by mouth 2 (two) times daily with a meal.     MULTIVITAMIN PO  Take 1 tablet by mouth daily.     tretinoin 0.05 % cream  Commonly known as:  RETIN-A  Apply 1 application topically daily.          Diet and Activity recommendation: See Discharge Instructions above   Consults obtained - diabetic educator   Major procedures and Radiology Reports - PLEASE review detailed and final reports for all details, in brief -       Dg Abd Portable 1v  05/22/2014   CLINICAL DATA:  Hyperglycemia. One week history of dysuria, urinary frequency and thirst. Prior history of left urinary tract calculus.  EXAM: PORTABLE ABDOMEN - 1 VIEW  COMPARISON:  One view abdomen x-ray 04/15/2009, 02/03/2009, 12/27/2008. CT urogram 12/27/2008. All prior imaging at [facility].  FINDINGS: Bowel gas pattern unremarkable without evidence of obstruction or significant ileus. Moderate stool burden in the colon. Calcified, degenerated fibroid in the left side of the pelvis as noted previously. Approximate 3 mm calcification projected over the expected location of the lower pole of the right kidney. No other visible  opaque urinary tract calculi. Degenerative changes involving the lower lumbar spine.  IMPRESSION: 1. No acute abdominal abnormality. 2. 3 mm calcification projected over the lower pole of the right kidney, likely an intrarenal calculus. 3. Calcified, degenerated uterine fibroid.   Electronically Signed   By: Evangeline Dakin M.D.   On: 05/22/2014 14:04    Micro Results      Recent Results (from the past 240 hour(s))  URINE CULTURE     Status: None   Collection Time    05/22/14 10:02 AM      Result Value Ref Range Status   Colony Count NO GROWTH   Final   Organism ID, Bacteria NO GROWTH   Final       Today   Subjective:   [name] today has no headache,no chest abdominal pain,no new weakness tingling or numbness, feels much better wants to go home today.    Objective:   Blood pressure 115/42, pulse 71, temperature 98.1 F (36.7 C), temperature source Oral, resp. rate 18, height _0  (1.651 m), weight 74.844 kg (165 lb), SpO2 98.00%.   Intake/Output Summary (Last 24 hours) at 05/24/14 1049 Last data filed at 05/24/14 0834  Gross per 24 hour  Intake    840 ml  Output      5 ml  Net    835 ml    Exam Awake Alert, Oriented x 3, No new F.N deficits, Normal affect [place].AT,PERRAL Supple Neck,No JVD, No cervical lymphadenopathy appriciated.  Symmetrical Chest wall movement, Good air movement bilaterally, CTAB RRR,No  Gallops,Rubs or new Murmurs, No Parasternal Heave +ve B.Sounds, Abd Soft, Non tender, No organomegaly appriciated, No rebound -guarding or rigidity. No Cyanosis, Clubbing or edema, No new Rash or bruise  Data Review   CBC w Diff: Lab Results  Component Value Date   WBC 3.5* 05/23/2014   HGB 11.2* 05/23/2014   HCT 32.1* 05/23/2014   PLT 123* 05/23/2014   LYMPHOPCT 12 05/22/2014   BANDSPCT 0 05/22/2014   MONOPCT 9 05/22/2014   EOSPCT 2 05/22/2014   BASOPCT 2* 05/22/2014    CMP: Lab Results  Component Value Date   NA 137 05/23/2014   K 4.3 05/23/2014   CL 107  05/23/2014   CO2 17* 05/23/2014   BUN 11 05/23/2014   CREATININE 0.53 05/23/2014   CREATININE 1.02 05/22/2014   PROT 6.7 05/22/2014   ALBUMIN 4.2 05/22/2014   BILITOT 0.6 05/22/2014   ALKPHOS 178* 05/22/2014   AST 13 05/22/2014   ALT 23 05/22/2014  . Lab Results  Component Value Date   HGBA1C 12.8* 05/22/2014    CBG (last 3)   Recent Labs  05/23/14 1634 05/23/14 2131 05/24/14 0651  GLUCAP 268* 265* 245*      Total Time in preparing paper work, data evaluation and todays exam - 35 minutes  Lala Lund K M.D on 05/24/2014 at 10:49 AM  [facility] Office  [phone]   **Disclaimer: This note may have been dictated with voice recognition software. Similar sounding words can inadvertently be transcribed and this note may contain transcription errors which may not have been corrected upon publication of note.**

## 2014-05-24 NOTE — Progress Notes (Signed)
Patient discharged to home. Discharge instructions and rx given and explained and patient stated understanding. IV was removed patient left unit in a stable condition with all personal belongings via wheelchair.

## 2014-05-24 NOTE — Progress Notes (Addendum)
Spoke to patient briefly regarding new Dx.  [facility]  Discussed goal CBG ranges and also reviewed what to do if CBG is low.  Patient was able to verbalize symptoms and treatment.  Told her that the least expensive place for her to purchase the 70/30 and glucose meter is [employer]. Also discussed that 70/30 insulin should be given with breakfast and supper.  She states that she has practiced giving insulin during hospitalization.  Gave her [facility] patient education DVD for her to watch at home after discharg to review survival skills.   Patient was appreciative and states that she does not have any further questions at this time.    Beryl MeagerJenny Jerusalem Brownstein, RN, BC-ADM Inpatient Diabetes Coordinator Pager [phone]

## 2014-05-24 NOTE — Care Management Note (Signed)
05/24/14 Abigail PeperSusan Tara Rud, RN BSN  Patient informed case manager that [facility], number is [phone]. CM contacted [facility] to inform them of correct #.

## 2014-05-28 ENCOUNTER — Telehealth (HOSPITAL_COMMUNITY): Payer: Self-pay

## 2014-07-06 ENCOUNTER — Telehealth: Payer: Self-pay | Admitting: *Deleted

## 2014-07-06 NOTE — Telephone Encounter (Signed)
Called patient to schedule an follow-up regarding 3 month diabetes check.  Patient's home phone is disconnected.

## 2014-07-06 NOTE — Telephone Encounter (Signed)
Called home number and it was disconnected.  Was unable to reach patient.

## 2014-10-01 ENCOUNTER — Emergency Department (HOSPITAL_COMMUNITY): Payer: Medicare Other

## 2014-10-01 ENCOUNTER — Inpatient Hospital Stay (HOSPITAL_COMMUNITY): Payer: Medicare Other

## 2014-10-01 ENCOUNTER — Inpatient Hospital Stay (HOSPITAL_COMMUNITY)
Admission: EM | Admit: 2014-10-01 | Discharge: 2014-10-13 | DRG: 870 | Disposition: A | Discharge status: Other Acute Inpatient Hospital | Payer: Medicare Other | Attending: Pulmonary Disease | Admitting: Pulmonary Disease

## 2014-10-01 DIAGNOSIS — E874 Mixed disorder of acid-base balance: Secondary | ICD-10-CM | POA: Diagnosis present

## 2014-10-01 DIAGNOSIS — J15211 Pneumonia due to Methicillin susceptible Staphylococcus aureus: Secondary | ICD-10-CM | POA: Diagnosis present

## 2014-10-01 DIAGNOSIS — E1011 Type 1 diabetes mellitus with ketoacidosis with coma: Secondary | ICD-10-CM

## 2014-10-01 DIAGNOSIS — Z6827 Body mass index (BMI) 27.0-27.9, adult: Secondary | ICD-10-CM | POA: Diagnosis not present

## 2014-10-01 DIAGNOSIS — F209 Schizophrenia, unspecified: Secondary | ICD-10-CM | POA: Diagnosis present

## 2014-10-01 DIAGNOSIS — Z8249 Family history of ischemic heart disease and other diseases of the circulatory system: Secondary | ICD-10-CM

## 2014-10-01 DIAGNOSIS — Z87891 Personal history of nicotine dependence: Secondary | ICD-10-CM

## 2014-10-01 DIAGNOSIS — R6521 Severe sepsis with septic shock: Secondary | ICD-10-CM | POA: Diagnosis present

## 2014-10-01 DIAGNOSIS — R1313 Dysphagia, pharyngeal phase: Secondary | ICD-10-CM | POA: Diagnosis present

## 2014-10-01 DIAGNOSIS — A419 Sepsis, unspecified organism: Secondary | ICD-10-CM | POA: Diagnosis present

## 2014-10-01 DIAGNOSIS — J9600 Acute respiratory failure, unspecified whether with hypoxia or hypercapnia: Secondary | ICD-10-CM

## 2014-10-01 DIAGNOSIS — Z452 Encounter for adjustment and management of vascular access device: Secondary | ICD-10-CM

## 2014-10-01 DIAGNOSIS — J9601 Acute respiratory failure with hypoxia: Secondary | ICD-10-CM

## 2014-10-01 DIAGNOSIS — I639 Cerebral infarction, unspecified: Secondary | ICD-10-CM

## 2014-10-01 DIAGNOSIS — D696 Thrombocytopenia, unspecified: Secondary | ICD-10-CM | POA: Diagnosis present

## 2014-10-01 DIAGNOSIS — I509 Heart failure, unspecified: Secondary | ICD-10-CM | POA: Diagnosis present

## 2014-10-01 DIAGNOSIS — I82613 Acute embolism and thrombosis of superficial veins of upper extremity, bilateral: Secondary | ICD-10-CM | POA: Diagnosis present

## 2014-10-01 DIAGNOSIS — E0811 Diabetes mellitus due to underlying condition with ketoacidosis with coma: Secondary | ICD-10-CM

## 2014-10-01 DIAGNOSIS — E43 Unspecified severe protein-calorie malnutrition: Secondary | ICD-10-CM | POA: Diagnosis present

## 2014-10-01 DIAGNOSIS — E876 Hypokalemia: Secondary | ICD-10-CM | POA: Diagnosis present

## 2014-10-01 DIAGNOSIS — Z9119 Patient's noncompliance with other medical treatment and regimen: Secondary | ICD-10-CM | POA: Diagnosis present

## 2014-10-01 DIAGNOSIS — D649 Anemia, unspecified: Secondary | ICD-10-CM | POA: Diagnosis present

## 2014-10-01 DIAGNOSIS — Z79899 Other long term (current) drug therapy: Secondary | ICD-10-CM | POA: Diagnosis not present

## 2014-10-01 DIAGNOSIS — N170 Acute kidney failure with tubular necrosis: Secondary | ICD-10-CM | POA: Diagnosis present

## 2014-10-01 DIAGNOSIS — Z4659 Encounter for fitting and adjustment of other gastrointestinal appliance and device: Secondary | ICD-10-CM

## 2014-10-01 DIAGNOSIS — A4101 Sepsis due to Methicillin susceptible Staphylococcus aureus: Secondary | ICD-10-CM | POA: Diagnosis not present

## 2014-10-01 DIAGNOSIS — J811 Chronic pulmonary edema: Secondary | ICD-10-CM

## 2014-10-01 DIAGNOSIS — E1111 Type 2 diabetes mellitus with ketoacidosis with coma: Secondary | ICD-10-CM

## 2014-10-01 DIAGNOSIS — R4182 Altered mental status, unspecified: Secondary | ICD-10-CM | POA: Insufficient documentation

## 2014-10-01 DIAGNOSIS — I469 Cardiac arrest, cause unspecified: Secondary | ICD-10-CM | POA: Diagnosis present

## 2014-10-01 DIAGNOSIS — I429 Cardiomyopathy, unspecified: Secondary | ICD-10-CM | POA: Diagnosis present

## 2014-10-01 DIAGNOSIS — E1310 Other specified diabetes mellitus with ketoacidosis without coma: Secondary | ICD-10-CM | POA: Diagnosis present

## 2014-10-01 DIAGNOSIS — Z794 Long term (current) use of insulin: Secondary | ICD-10-CM

## 2014-10-01 DIAGNOSIS — I240 Acute coronary thrombosis not resulting in myocardial infarction: Secondary | ICD-10-CM | POA: Diagnosis present

## 2014-10-01 DIAGNOSIS — R579 Shock, unspecified: Secondary | ICD-10-CM

## 2014-10-01 DIAGNOSIS — E1110 Type 2 diabetes mellitus with ketoacidosis without coma: Secondary | ICD-10-CM | POA: Diagnosis present

## 2014-10-01 DIAGNOSIS — N189 Chronic kidney disease, unspecified: Secondary | ICD-10-CM | POA: Diagnosis present

## 2014-10-01 DIAGNOSIS — T68XXXA Hypothermia, initial encounter: Secondary | ICD-10-CM

## 2014-10-01 DIAGNOSIS — Z9289 Personal history of other medical treatment: Secondary | ICD-10-CM

## 2014-10-01 DIAGNOSIS — E872 Acidosis, unspecified: Secondary | ICD-10-CM

## 2014-10-01 DIAGNOSIS — E871 Hypo-osmolality and hyponatremia: Secondary | ICD-10-CM | POA: Diagnosis present

## 2014-10-01 DIAGNOSIS — E860 Dehydration: Secondary | ICD-10-CM | POA: Diagnosis not present

## 2014-10-01 DIAGNOSIS — I129 Hypertensive chronic kidney disease with stage 1 through stage 4 chronic kidney disease, or unspecified chronic kidney disease: Secondary | ICD-10-CM | POA: Diagnosis present

## 2014-10-01 DIAGNOSIS — G931 Anoxic brain damage, not elsewhere classified: Secondary | ICD-10-CM | POA: Diagnosis present

## 2014-10-01 DIAGNOSIS — G9340 Encephalopathy, unspecified: Secondary | ICD-10-CM | POA: Diagnosis present

## 2014-10-01 DIAGNOSIS — E8889 Other specified metabolic disorders: Secondary | ICD-10-CM | POA: Diagnosis present

## 2014-10-01 DIAGNOSIS — J9602 Acute respiratory failure with hypercapnia: Secondary | ICD-10-CM

## 2014-10-01 DIAGNOSIS — R4020 Unspecified coma: Secondary | ICD-10-CM | POA: Diagnosis present

## 2014-10-01 DIAGNOSIS — B9561 Methicillin susceptible Staphylococcus aureus infection as the cause of diseases classified elsewhere: Secondary | ICD-10-CM | POA: Insufficient documentation

## 2014-10-01 DIAGNOSIS — J189 Pneumonia, unspecified organism: Secondary | ICD-10-CM | POA: Insufficient documentation

## 2014-10-01 DIAGNOSIS — T189XXA Foreign body of alimentary tract, part unspecified, initial encounter: Secondary | ICD-10-CM

## 2014-10-01 DIAGNOSIS — N179 Acute kidney failure, unspecified: Secondary | ICD-10-CM | POA: Insufficient documentation

## 2014-10-01 DIAGNOSIS — Z823 Family history of stroke: Secondary | ICD-10-CM

## 2014-10-01 DIAGNOSIS — R7881 Bacteremia: Secondary | ICD-10-CM | POA: Insufficient documentation

## 2014-10-01 DIAGNOSIS — Z978 Presence of other specified devices: Secondary | ICD-10-CM

## 2014-10-01 LAB — BASIC METABOLIC PANEL
ANION GAP: 21 — AB (ref 5–15)
ANION GAP: 18 — AB (ref 5–15)
Anion gap: 17 — ABNORMAL HIGH (ref 5–15)
BUN: 27 mg/dL — ABNORMAL HIGH (ref 6–23)
BUN: 28 mg/dL — ABNORMAL HIGH (ref 6–23)
BUN: 29 mg/dL — AB (ref 6–23)
CALCIUM: 7.4 mg/dL — AB (ref 8.4–10.5)
CALCIUM: 7.8 mg/dL — AB (ref 8.4–10.5)
CO2: 11 meq/L — AB (ref 19–32)
CO2: 9 meq/L — AB (ref 19–32)
CO2: 13 mEq/L — ABNORMAL LOW (ref 19–32)
CREATININE: 1.7 mg/dL — AB (ref 0.50–1.10)
Calcium: 7.7 mg/dL — ABNORMAL LOW (ref 8.4–10.5)
Chloride: 108 mEq/L (ref 96–112)
Chloride: 105 mEq/L (ref 96–112)
Chloride: 109 mEq/L (ref 96–112)
Creatinine, Ser: 1.34 mg/dL — ABNORMAL HIGH (ref 0.50–1.10)
Creatinine, Ser: 1.52 mg/dL — ABNORMAL HIGH (ref 0.50–1.10)
GFR calc Af Amer: 48 mL/min — ABNORMAL LOW (ref >90)
GFR calc Af Amer: 42 mL/min — ABNORMAL LOW (ref >90)
GFR calc Af Amer: 36 mL/min — ABNORMAL LOW (ref >90)
GFR calc non Af Amer: 42 mL/min — ABNORMAL LOW (ref >90)
GFR, EST NON AFRICAN AMERICAN: 36 mL/min — AB (ref >90)
GFR, EST NON AFRICAN AMERICAN: 31 mL/min — AB (ref >90)
GLUCOSE: 454 mg/dL — AB (ref 70–99)
Glucose, Bld: 474 mg/dL — ABNORMAL HIGH (ref 70–99)
Glucose, Bld: 465 mg/dL — ABNORMAL HIGH (ref 70–99)
POTASSIUM: 3.5 meq/L — AB (ref 3.7–5.3)
POTASSIUM: 2.8 meq/L — AB (ref 3.7–5.3)
Potassium: 2.6 mEq/L — CL (ref 3.7–5.3)
SODIUM: 132 meq/L — AB (ref 137–147)
Sodium: 140 mEq/L (ref 137–147)
Sodium: 139 mEq/L (ref 137–147)

## 2014-10-01 LAB — DIFFERENTIAL
BASOS ABS: 0.1 10*3/uL (ref 0.0–0.1)
Basophils Relative: 1 % (ref 0–1)
Eosinophils Absolute: 0 10*3/uL (ref 0.0–0.7)
Eosinophils Relative: 0 % (ref 0–5)
LYMPHS ABS: 0.3 10*3/uL — AB (ref 0.7–4.0)
Lymphocytes Relative: 5 % — ABNORMAL LOW (ref 12–46)
MONO ABS: 0.4 10*3/uL (ref 0.1–1.0)
Monocytes Relative: 6 % (ref 3–12)
NEUTROS PCT: 88 % — AB (ref 43–77)
Neutro Abs: 6.1 10*3/uL (ref 1.7–7.7)

## 2014-10-01 LAB — COMPREHENSIVE METABOLIC PANEL
ALT: 14 U/L (ref 0–35)
AST: 37 U/L (ref 0–37)
Albumin: 2.4 g/dL — ABNORMAL LOW (ref 3.5–5.2)
Alkaline Phosphatase: 121 U/L — ABNORMAL HIGH (ref 39–117)
BUN: 25 mg/dL — ABNORMAL HIGH (ref 6–23)
CO2: <7 mEq/L — CL (ref 19–32)
Calcium: 9.7 mg/dL (ref 8.4–10.5)
Chloride: 92 mEq/L — ABNORMAL LOW (ref 96–112)
Creatinine, Ser: 1.28 mg/dL — ABNORMAL HIGH (ref 0.50–1.10)
GFR calc non Af Amer: 44 mL/min — ABNORMAL LOW (ref >90)
GFR, EST AFRICAN AMERICAN: 51 mL/min — AB (ref >90)
Glucose, Bld: 595 mg/dL (ref 70–99)
POTASSIUM: 5.1 meq/L (ref 3.7–5.3)
SODIUM: 129 meq/L — AB (ref 137–147)
TOTAL PROTEIN: 7.1 g/dL (ref 6.0–8.3)
Total Bilirubin: 0.4 mg/dL (ref 0.3–1.2)

## 2014-10-01 LAB — PHOSPHORUS: Phosphorus: 2.4 mg/dL (ref 2.3–4.6)

## 2014-10-01 LAB — URINE MICROSCOPIC-ADD ON

## 2014-10-01 LAB — POCT I-STAT 3, ART BLOOD GAS (G3+)
ACID-BASE DEFICIT: 15 mmol/L — AB (ref 0.0–2.0)
Bicarbonate: 11.6 mEq/L — ABNORMAL LOW (ref 20.0–24.0)
O2 Saturation: 95 %
Patient temperature: 100
TCO2: 12 mmol/L (ref 0–100)
pCO2 arterial: 31.7 mmHg — ABNORMAL LOW (ref 35.0–45.0)
pH, Arterial: 7.175 — CL (ref 7.350–7.450)
pO2, Arterial: 97 mmHg (ref 80.0–100.0)

## 2014-10-01 LAB — GLUCOSE, CAPILLARY
GLUCOSE-CAPILLARY: 431 mg/dL — AB (ref 70–99)
GLUCOSE-CAPILLARY: 450 mg/dL — AB (ref 70–99)
GLUCOSE-CAPILLARY: 380 mg/dL — AB (ref 70–99)
GLUCOSE-CAPILLARY: 398 mg/dL — AB (ref 70–99)
Glucose-Capillary: 428 mg/dL — ABNORMAL HIGH (ref 70–99)
Glucose-Capillary: 408 mg/dL — ABNORMAL HIGH (ref 70–99)

## 2014-10-01 LAB — RAPID URINE DRUG SCREEN, HOSP PERFORMED
Amphetamines: NOT DETECTED
BARBITURATES: NOT DETECTED
Benzodiazepines: NOT DETECTED
Cocaine: NOT DETECTED
Opiates: NOT DETECTED
TETRAHYDROCANNABINOL: NOT DETECTED

## 2014-10-01 LAB — I-STAT ARTERIAL BLOOD GAS, ED
ACID-BASE DEFICIT: 24 mmol/L — AB (ref 0.0–2.0)
Acid-base deficit: 27 mmol/L — ABNORMAL HIGH (ref 0.0–2.0)
BICARBONATE: 7.8 meq/L — AB (ref 20.0–24.0)
Bicarbonate: 7.2 mEq/L — ABNORMAL LOW (ref 20.0–24.0)
O2 SAT: 100 %
O2 Saturation: 100 %
PCO2 ART: 38.3 mmHg (ref 35.0–45.0)
PH ART: 6.864 — AB (ref 7.350–7.450)
PO2 ART: 321 mmHg — AB (ref 80.0–100.0)
Patient temperature: 34.5
Patient temperature: 35.6
TCO2: 9 mmol/L (ref 0–100)
TCO2: 9 mmol/L (ref 0–100)
pCO2 arterial: 35.8 mmHg (ref 35.0–45.0)
pH, Arterial: 6.934 — CL (ref 7.350–7.450)
pO2, Arterial: 471 mmHg — ABNORMAL HIGH (ref 80.0–100.0)

## 2014-10-01 LAB — CBG MONITORING, ED
GLUCOSE-CAPILLARY: 518 mg/dL — AB (ref 70–99)
GLUCOSE-CAPILLARY: 422 mg/dL — AB (ref 70–99)
Glucose-Capillary: 523 mg/dL — ABNORMAL HIGH (ref 70–99)

## 2014-10-01 LAB — URINALYSIS, ROUTINE W REFLEX MICROSCOPIC
Glucose, UA: >1000 mg/dL — AB
Ketones, ur: >80 mg/dL — AB
Leukocytes, UA: NEGATIVE
NITRITE: NEGATIVE
PROTEIN: 100 mg/dL — AB
Specific Gravity, Urine: 1.024 (ref 1.005–1.030)
Urobilinogen, UA: 0.2 mg/dL (ref 0.0–1.0)
pH: 5 (ref 5.0–8.0)

## 2014-10-01 LAB — CBC
HCT: 40.8 % (ref 36.0–46.0)
Hemoglobin: 13.6 g/dL (ref 12.0–15.0)
MCH: 27 pg (ref 26.0–34.0)
MCHC: 33.3 g/dL (ref 30.0–36.0)
MCV: 81.1 fL (ref 78.0–100.0)
Platelets: 252 10*3/uL (ref 150–400)
RBC: 5.03 MIL/uL (ref 3.87–5.11)
RDW: 15.9 % — ABNORMAL HIGH (ref 11.5–15.5)
WBC: 6.9 10*3/uL (ref 4.0–10.5)

## 2014-10-01 LAB — PRO B NATRIURETIC PEPTIDE: Pro B Natriuretic peptide (BNP): 2023 pg/mL — ABNORMAL HIGH (ref 0–125)

## 2014-10-01 LAB — SALICYLATE LEVEL: Salicylate Lvl: <2 mg/dL — ABNORMAL LOW (ref 2.8–20.0)

## 2014-10-01 LAB — I-STAT CG4 LACTIC ACID, ED: LACTIC ACID, VENOUS: 1.28 mmol/L (ref 0.5–2.2)

## 2014-10-01 LAB — MAGNESIUM: Magnesium: 1.6 mg/dL (ref 1.5–2.5)

## 2014-10-01 LAB — TROPONIN I

## 2014-10-01 LAB — ACETAMINOPHEN LEVEL

## 2014-10-01 LAB — MRSA PCR SCREENING: MRSA BY PCR: NEGATIVE

## 2014-10-01 LAB — I-STAT TROPONIN, ED: Troponin i, poc: 0.15 ng/mL (ref 0.00–0.08)

## 2014-10-01 LAB — LACTIC ACID, PLASMA: LACTIC ACID, VENOUS: 1.6 mmol/L (ref 0.5–2.2)

## 2014-10-01 MED ORDER — HEPARIN SODIUM (PORCINE) 5000 UNIT/ML IJ SOLN
5000.0000 [IU] | Freq: Three times a day (TID) | INTRAMUSCULAR | Status: DC
  Administered 2014-10-01 – 2014-10-05 (×11): 5000 [IU] via SUBCUTANEOUS
  Filled 2014-10-01 (×12): qty 1

## 2014-10-01 MED ORDER — FENTANYL CITRATE 0.05 MG/ML IJ SOLN
INTRAMUSCULAR | Status: AC
  Administered 2014-10-01: 100 ug
  Filled 2014-10-01: qty 2

## 2014-10-01 MED ORDER — LIDOCAINE HCL (CARDIAC) 20 MG/ML IV SOLN
INTRAVENOUS | Status: AC
  Filled 2014-10-01: qty 5

## 2014-10-01 MED ORDER — SODIUM CHLORIDE 0.9 % IV SOLN
INTRAVENOUS | Status: AC
Start: 2014-10-01 — End: 2014-10-01

## 2014-10-01 MED ORDER — CLINDAMYCIN PHOSPHATE 900 MG/50ML IV SOLN
900.0000 mg | Freq: Once | INTRAVENOUS | Status: AC
  Administered 2014-10-01: 900 mg via INTRAVENOUS
  Filled 2014-10-01: qty 50

## 2014-10-01 MED ORDER — ROCURONIUM BROMIDE 50 MG/5ML IV SOLN
INTRAVENOUS | Status: AC
  Filled 2014-10-01: qty 2

## 2014-10-01 MED ORDER — SODIUM CHLORIDE 0.9 % IV SOLN
INTRAVENOUS | Status: DC
  Administered 2014-10-01 – 2014-10-10 (×3): via INTRAVENOUS

## 2014-10-01 MED ORDER — MAGNESIUM SULFATE 2 GM/50ML IV SOLN
2.0000 g | Freq: Once | INTRAVENOUS | Status: AC
  Administered 2014-10-01: 2 g via INTRAVENOUS
  Filled 2014-10-01: qty 50

## 2014-10-01 MED ORDER — SODIUM BICARBONATE 8.4 % IV SOLN
100.0000 meq | Freq: Once | INTRAVENOUS | Status: AC
  Filled 2014-10-01: qty 100

## 2014-10-01 MED ORDER — DEXTROSE 5 % IV SOLN
30.0000 ug/min | INTRAVENOUS | Status: DC
  Administered 2014-10-01: 30 ug/min via INTRAVENOUS
  Filled 2014-10-01 (×2): qty 1

## 2014-10-01 MED ORDER — PHENYLEPHRINE HCL 10 MG/ML IJ SOLN
30.0000 ug/min | INTRAVENOUS | Status: DC
  Administered 2014-10-02 (×2): 200 ug/min via INTRAVENOUS
  Administered 2014-10-02: 100 ug/min via INTRAVENOUS
  Administered 2014-10-02 (×2): 160 ug/min via INTRAVENOUS
  Administered 2014-10-03: 150 ug/min via INTRAVENOUS
  Administered 2014-10-03: 155 ug/min via INTRAVENOUS
  Administered 2014-10-03 – 2014-10-04 (×2): 145 ug/min via INTRAVENOUS
  Administered 2014-10-04: 150 ug/min via INTRAVENOUS
  Administered 2014-10-05: 155 ug/min via INTRAVENOUS
  Administered 2014-10-05: 180 ug/min via INTRAVENOUS
  Administered 2014-10-05 (×3): 200 ug/min via INTRAVENOUS
  Administered 2014-10-05: 135 ug/min via INTRAVENOUS
  Administered 2014-10-05: 200 ug/min via INTRAVENOUS
  Administered 2014-10-06: 125 ug/min via INTRAVENOUS
  Administered 2014-10-06: 140 ug/min via INTRAVENOUS
  Administered 2014-10-07: 75 ug/min via INTRAVENOUS
  Administered 2014-10-07: 100 ug/min via INTRAVENOUS
  Filled 2014-10-01 (×28): qty 4

## 2014-10-01 MED ORDER — DEXTROSE 5 % IV SOLN
1.0000 g | Freq: Once | INTRAVENOUS | Status: AC
  Administered 2014-10-01: 1 g via INTRAVENOUS

## 2014-10-01 MED ORDER — PANTOPRAZOLE SODIUM 40 MG IV SOLR
40.0000 mg | INTRAVENOUS | Status: DC
  Administered 2014-10-01 – 2014-10-09 (×9): 40 mg via INTRAVENOUS
  Filled 2014-10-01 (×9): qty 40

## 2014-10-01 MED ORDER — SODIUM BICARBONATE 8.4 % IV SOLN
INTRAVENOUS | Status: AC
  Administered 2014-10-01: 100 meq
  Filled 2014-10-01: qty 50

## 2014-10-01 MED ORDER — FENTANYL CITRATE 0.05 MG/ML IJ SOLN
INTRAMUSCULAR | Status: DC
Start: 2014-10-01 — End: 2014-10-01
  Filled 2014-10-01: qty 2

## 2014-10-01 MED ORDER — HYDROCORTISONE NA SUCCINATE PF 100 MG IJ SOLR
50.0000 mg | Freq: Four times a day (QID) | INTRAMUSCULAR | Status: DC
  Administered 2014-10-01 – 2014-10-04 (×11): 50 mg via INTRAVENOUS
  Filled 2014-10-01 (×15): qty 1

## 2014-10-01 MED ORDER — SODIUM CHLORIDE 0.9 % IV SOLN
1000.0000 mL | Freq: Once | INTRAVENOUS | Status: AC
  Administered 2014-10-01: 1000 mL via INTRAVENOUS

## 2014-10-01 MED ORDER — NOREPINEPHRINE BITARTRATE 1 MG/ML IV SOLN
2.0000 ug/min | INTRAVENOUS | Status: DC
  Administered 2014-10-01: 50 ug/min via INTRAVENOUS
  Administered 2014-10-01: 40 ug/min via INTRAVENOUS
  Administered 2014-10-02: 15 ug/min via INTRAVENOUS
  Administered 2014-10-03: 8 ug/min via INTRAVENOUS
  Filled 2014-10-01 (×5): qty 16

## 2014-10-01 MED ORDER — DEXTROSE 5 % IV SOLN
1.0000 g | Freq: Three times a day (TID) | INTRAVENOUS | Status: DC
  Administered 2014-10-01 – 2014-10-02 (×2): 1 g via INTRAVENOUS
  Filled 2014-10-01 (×5): qty 1

## 2014-10-01 MED ORDER — EPINEPHRINE HCL 0.1 MG/ML IJ SOSY
PREFILLED_SYRINGE | INTRAMUSCULAR | Status: AC | PRN
  Administered 2014-10-01: 1 mg via INTRAVENOUS

## 2014-10-01 MED ORDER — NOREPINEPHRINE BITARTRATE 1 MG/ML IV SOLN: 2.0000 ug/min | INTRAVENOUS | Status: DC

## 2014-10-01 MED ORDER — SODIUM BICARBONATE 8.4 % IV SOLN
INTRAVENOUS | Status: AC
  Filled 2014-10-01: qty 100

## 2014-10-01 MED ORDER — SODIUM CHLORIDE 0.9 % IV SOLN
1000.0000 mL | INTRAVENOUS | Status: DC
  Administered 2014-10-01 (×2): 1000 mL via INTRAVENOUS

## 2014-10-01 MED ORDER — SODIUM BICARBONATE 8.4 % IV SOLN
INTRAVENOUS | Status: AC | PRN
  Administered 2014-10-01: 50 meq via INTRAVENOUS

## 2014-10-01 MED ORDER — SODIUM CHLORIDE 0.9 % IV SOLN
INTRAVENOUS | Status: DC
  Administered 2014-10-01: 4.6 [IU]/h via INTRAVENOUS
  Filled 2014-10-01: qty 2.5

## 2014-10-01 MED ORDER — LEVOFLOXACIN IN D5W 750 MG/150ML IV SOLN
750.0000 mg | Freq: Once | INTRAVENOUS | Status: AC
  Administered 2014-10-01: 750 mg via INTRAVENOUS
  Filled 2014-10-01: qty 150

## 2014-10-01 MED ORDER — CHLORHEXIDINE GLUCONATE 0.12 % MT SOLN
15.0000 mL | Freq: Two times a day (BID) | OROMUCOSAL | Status: DC
  Administered 2014-10-01 – 2014-10-12 (×22): 15 mL via OROMUCOSAL
  Filled 2014-10-01 (×22): qty 15

## 2014-10-01 MED ORDER — DEXTROSE 50 % IV SOLN
25.0000 mL | INTRAVENOUS | Status: DC | PRN
  Administered 2014-10-06: 50 mL via INTRAVENOUS
  Filled 2014-10-01: qty 50

## 2014-10-01 MED ORDER — SUCCINYLCHOLINE CHLORIDE 20 MG/ML IJ SOLN
INTRAMUSCULAR | Status: AC
  Administered 2014-10-01: 100 mg
  Filled 2014-10-01: qty 1

## 2014-10-01 MED ORDER — FENTANYL CITRATE 0.05 MG/ML IJ SOLN
100.0000 ug | INTRAMUSCULAR | Status: DC | PRN
  Administered 2014-10-02: 100 ug via INTRAVENOUS
  Filled 2014-10-01 (×2): qty 2

## 2014-10-01 MED ORDER — VASOPRESSIN 20 UNIT/ML IV SOLN
0.0300 [IU]/min | INTRAVENOUS | Status: DC
  Administered 2014-10-01 – 2014-10-07 (×6): 0.03 [IU]/min via INTRAVENOUS
  Filled 2014-10-01 (×8): qty 2

## 2014-10-01 MED ORDER — POTASSIUM CHLORIDE 10 MEQ/50ML IV SOLN
10.0000 meq | INTRAVENOUS | Status: AC
  Administered 2014-10-01 – 2014-10-02 (×6): 10 meq via INTRAVENOUS
  Filled 2014-10-01: qty 50

## 2014-10-01 MED ORDER — MIDAZOLAM HCL 2 MG/2ML IJ SOLN
INTRAMUSCULAR | Status: AC
  Filled 2014-10-01: qty 4

## 2014-10-01 MED ORDER — MIDAZOLAM HCL 2 MG/2ML IJ SOLN
INTRAMUSCULAR | Status: AC
  Administered 2014-10-01: 2 mg
  Filled 2014-10-01: qty 2

## 2014-10-01 MED ORDER — DEXTROSE-NACL 5-0.45 % IV SOLN
INTRAVENOUS | Status: DC
  Administered 2014-10-02 – 2014-10-03 (×2): via INTRAVENOUS

## 2014-10-01 MED ORDER — FENTANYL CITRATE 0.05 MG/ML IJ SOLN
100.0000 ug | INTRAMUSCULAR | Status: AC | PRN
  Administered 2014-10-02 (×3): 100 ug via INTRAVENOUS
  Filled 2014-10-01 (×2): qty 2

## 2014-10-01 MED ORDER — SODIUM BICARBONATE 8.4 % IV SOLN
INTRAVENOUS | Status: DC
  Administered 2014-10-01 – 2014-10-02 (×3): via INTRAVENOUS
  Filled 2014-10-01 (×5): qty 150

## 2014-10-01 MED ORDER — SODIUM CHLORIDE 0.9 % IV BOLUS (SEPSIS)
1000.0000 mL | Freq: Once | INTRAVENOUS | Status: AC
  Administered 2014-10-01: 1000 mL via INTRAVENOUS

## 2014-10-01 MED ORDER — ETOMIDATE 2 MG/ML IV SOLN
INTRAVENOUS | Status: AC
  Administered 2014-10-01: 20 mg
  Filled 2014-10-01: qty 20

## 2014-10-01 MED ORDER — CETYLPYRIDINIUM CHLORIDE 0.05 % MT LIQD
7.0000 mL | Freq: Four times a day (QID) | OROMUCOSAL | Status: DC
Start: 2014-10-02 — End: 2014-10-12
  Administered 2014-10-02 – 2014-10-12 (×43): 7 mL via OROMUCOSAL

## 2014-10-01 MED ORDER — SODIUM CHLORIDE 0.9 % IV SOLN
INTRAVENOUS | Status: DC
  Administered 2014-10-01: 3.7 [IU]/h via INTRAVENOUS
  Administered 2014-10-02: 33.5 [IU]/h via INTRAVENOUS
  Administered 2014-10-02: 20.2 [IU]/h via INTRAVENOUS
  Filled 2014-10-01 (×4): qty 2.5

## 2014-10-01 MED FILL — Medication: Qty: 1 | Status: AC

## 2014-10-01 NOTE — Progress Notes (Signed)
eLink Physician-Brief Progress Note Patient Name: Abigail RutherfordDeborah A Zamora DOB: February 12, 1951 MRN: [id number]   Date of Service  10/01/2014  HPI/Events of Note  K and Mg low.  eICU Interventions  Will replace both IV.     Intervention Category Major Interventions: Other:  [name] 10/01/2014, 9:19 PM

## 2014-10-01 NOTE — H&P (Signed)
PULMONARY / CRITICAL CARE MEDICINE   Name: [name] MRN: [id number] DOB: [birth year]    ADMISSION DATE:  10/01/2014 CONSULTATION DATE:  10/01/2014  REFERRING MD :  EDP  CHIEF COMPLAINT:  DKA, respiratory failure and AMS  INITIAL PRESENTATION: 63 year old female with PMH of diabetes and medical non-compliance who was last seen normal the day PTP at 8 PM.  Husband woke up this AM and was unable to arouse patient.  Evidently both have been "fighting a cold" but patient was coughing more the night before.  EMS was called and patient was brought to the ED.  In the ED patient became unresponsive and was intubated by EDP.  PCCM was called to admit.  STUDIES:  11/13 CXR with aspiration PNA and abdominal x-ray with dentures in her lower abdomen.  SIGNIFICANT EVENTS: 11/13 Presenting with AMS, DKA and VDRF.  PAST MEDICAL HISTORY :   has a past medical history of Mixed stress and urge urinary incontinence; Schizophrenia; and Allergy.  has no past surgical history on file. Prior to Admission medications   Medication Sig Start Date End Date Taking? Authorizing Provider  ARIPiprazole (ABILIFY) 10 MG tablet Take 10 mg by mouth daily.    Historical Provider, MD  benztropine (COGENTIN) 0.5 MG tablet Take 0.5 mg by mouth 2 (two) times daily.     Historical Provider, MD  buPROPion (WELLBUTRIN XL) 150 MG 24 hr tablet Take 150 mg by mouth daily.    Historical Provider, MD  fluticasone (FLONASE) 50 MCG/ACT nasal spray Place 1 spray into both nostrils daily. 02/16/14   Historical Provider, MD  glucose monitoring kit (FREESTYLE) monitoring kit 1 each by Does not apply route 4 (four) times daily - after meals and at bedtime. 1 month Diabetic Testing Supplies for QAC-QHS accuchecks.Any brand OK 05/24/14   Thurnell Lose, MD  insulin NPH-regular Human (NOVOLIN 70/30) (70-30) 100 UNIT/ML injection Inject 20 Units into the skin 2 (two) times daily with a meal. 05/24/14   Thurnell Lose, MD  lisinopril  (PRINIVIL,ZESTRIL) 5 MG tablet Take 1 tablet (5 mg total) by mouth daily. 05/24/14   Thurnell Lose, MD  metFORMIN (GLUCOPHAGE) 500 MG tablet Take 1 tablet (500 mg total) by mouth 2 (two) times daily with a meal. 05/24/14   Thurnell Lose, MD  Multiple Vitamins-Minerals (MULTIVITAMIN PO) Take 1 tablet by mouth daily.    Historical Provider, MD  tretinoin (RETIN-A) 0.05 % cream Apply 1 application topically daily. 04/23/14   Historical Provider, MD   Allergies  Allergen Reactions  . Penicillins     Rash     FAMILY HISTORY:  indicated that her mother is deceased. She indicated that her father is alive. She indicated that her sister is deceased. She indicated that her brother is deceased.  SOCIAL HISTORY:  reports that she has quit smoking. Her smoking use included Cigarettes. She smoked 3.00 packs per day. She does not have any smokeless tobacco history on file. She reports that she drinks about 3.6 oz of alcohol per week.  REVIEW OF SYSTEMS:  Unattainable, patient is sedated and intubated.  SUBJECTIVE:   VITAL SIGNS: Temp:  [94.3 F (34.6 C)] 94.3 F (34.6 C) (11/13 1032) Pulse Rate:  [90-106] 93 (11/13 1030) Resp:  [16-25] 16 (11/13 1030) BP: (87-114)/(30-83) 102/43 mmHg (11/13 1030) SpO2:  [95 %-100 %] 100 % (11/13 1030) FiO2 (%):  [70 %-100 %] 70 % (11/13 1121) HEMODYNAMICS:   VENTILATOR SETTINGS: Vent Mode:  [-] PRVC  FiO2 (%):  [70 %-100 %] 70 % Set Rate:  [12 bmp] 12 bmp Vt Set:  [500 mL] 500 mL PEEP:  [5 cmH20] 5 cmH20 Plateau Pressure:  [20 cmH20] 20 cmH20 INTAKE / OUTPUT:  Intake/Output Summary (Last 24 hours) at 10/01/14 1211 Last data filed at 10/01/14 1135  Gross per 24 hour  Intake   3000 ml  Output      0 ml  Net   3000 ml    PHYSICAL EXAMINATION: General:  Chronically ill appearing female who appears older than stated age, agonally breathing on the ventilator. Neuro:  Unresponsive, moving all ext spontaneously. HEENT:  [place]/AT, PERRL, EOM-spontaneous,  DMM Cardiovascular:  Regular, tachy, Nl S1/S2, -M/R/G. Lungs:  Coarse BS diffusely, R>L rales. Abdomen:  Soft, NT, ND and +BS. Musculoskeletal:  -edema and -tenderness. Skin:  Thin but intact.  LABS:  CBC No results for input(s): WBC, HGB, HCT, PLT in the last 168 hours. Coag's No results for input(s): APTT, INR in the last 168 hours. BMET  Recent Labs Lab 10/01/14 0950  NA 129*  K 5.1  CL 92*  CO2 <7*  BUN 25*  CREATININE 1.28*  GLUCOSE 595*   Electrolytes  Recent Labs Lab 10/01/14 0950  CALCIUM 9.7   Sepsis Markers  Recent Labs Lab 10/01/14 1007  LATICACIDVEN 1.28   ABG  Recent Labs Lab 10/01/14 1034  PHART 6.864*  PCO2ART 38.3  PO2ART 471.0*   Liver Enzymes  Recent Labs Lab 10/01/14 0950  AST 37  ALT 14  ALKPHOS 121*  BILITOT 0.4  ALBUMIN 2.4*   Cardiac Enzymes  Recent Labs Lab 10/01/14 0950  PROBNP 2023.0*   Glucose  Recent Labs Lab 10/01/14 0935 10/01/14 1013 10/01/14 1123  GLUCAP 518* 523* 422*    Imaging No results found.   ASSESSMENT / PLAN:  PULMONARY OETT 11/13>>> A: VDRF due to AMS from likely acidosis due to DKA. P:   - Intubate. - Full vent support. - Increase RR to 30. - F/U ABG. - Increase PEEP to 10. - Increase FiO2 to 80%. - F/U CXR post TLC placement.  CARDIOVASCULAR CVL [facility] TLC 11/13>>> A: Hypotension likely related to acidosis and sepsis. P:  - Aggressive IVF. - 2D echo. - Follow CVP. - Levophed as needed. - Hold all anti-HTN. - Treat infection. - Trend troponin. - If rises further then will consider calling cardiology. - EKG.  RENAL A:  Severe metabolic acidosis. P:   - Treat DKA. - Serial BMETs. - Aggressive fluid resuscitation. - Replete electrolytes as indicated.  GASTROINTESTINAL A:  Swallowed dentures.  Seen in her pelvic on AXR. P:   - GI consult. - NGT. - TF if intubated in 24 hours.  HEMATOLOGIC A:  Elevated WBC. P:  - Monitor CBC.  INFECTIOUS A:  Likely  aspiration PNA. P:   BCx2 11/13>>> UC 11/13>>> Sputum 11/13>>> Abx: Cefepime, start date 11/13, day 1/x  ENDOCRINE A:  DM and DKA P:   - DKA protocol. - CBGs. - Check cortisol. - Hydrocortisone.  NEUROLOGIC A:  AMS due to DKA and acidosis. P:   RASS goal: 0 - PRN versed and fentanyl.   FAMILY  - Updates: Husband updated bedside.  TODAY'S SUMMARY: 63 year old, DKA, asp PNA, DKA protocol, abx, monitor, aspirated denture, consult GI.  My CC time 60 min.   Rush Farmer, M.D. [facility] Pulmonary/Critical Care Medicine. Pager: [phone]. After hours pager: [phone].  10/01/2014, 12:11 PM

## 2014-10-01 NOTE — Procedures (Signed)
Central Venous Catheter Insertion Procedure Note Abigail RutherfordDeborah A Zamora [id number] Jun 20, 1951  Procedure: Insertion of Central Venous Catheter Indications: Assessment of intravascular volume, Drug and/or fluid administration and Frequent blood sampling  Procedure Details Consent: Risks of procedure as well as the alternatives and risks of each were explained to the (patient/caregiver).  Consent for procedure obtained. Time Out: Verified patient identification, verified procedure, site/side was marked, verified correct patient position, special equipment/implants available, medications/allergies/relevent history reviewed, required imaging and test results available.  Performed  Maximum sterile technique was used including antiseptics, cap, gloves, gown, hand hygiene, mask and sheet. Skin prep: Chlorhexidine; local anesthetic administered A antimicrobial bonded/coated triple lumen catheter was placed in the right subclavian vein using the Seldinger technique.  Evaluation Blood flow good Complications: No apparent complications Patient did tolerate procedure well. Chest X-ray ordered to verify placement.  CXR: pending.  U/S used in placement.  [name] 10/01/2014, 1:04 PM

## 2014-10-01 NOTE — Progress Notes (Signed)
eLink Physician-Brief Progress Note Patient Name: Abigail RutherfordDeborah A Zamora DOB: 01-Feb-1951 MRN: [id number]   Date of Service  10/01/2014  HPI/Events of Note  Shock on pressors.  Tachycardic.  eICU Interventions  Will start phenylephrine and wean off norepinephrine.      Intervention Category Major Interventions: Other:  Daren Yeagle 10/01/2014, 6:41 PM

## 2014-10-01 NOTE — Code Documentation (Signed)
Pulse check, Pulse present, sinus tach on monitor.

## 2014-10-01 NOTE — Procedures (Signed)
Arterial Catheter Insertion Procedure Note Narda RutherfordDeborah A Maciver [id number] [birth year]  Procedure: Insertion of Arterial Catheter  Indications: Blood pressure monitoring and Frequent blood sampling  Procedure Details Consent: Risks of procedure as well as the alternatives and risks of each were explained to the (patient/caregiver).  Consent for procedure obtained. Time Out: Verified patient identification, verified procedure, site/side was marked, verified correct patient position, special equipment/implants available, medications/allergies/relevent history reviewed, required imaging and test results available.  Performed  Maximum sterile technique was used including antiseptics, cap, gloves, gown, hand hygiene, mask and sheet. Skin prep: Chlorhexidine; local anesthetic administered 20 gauge catheter was inserted into right radial artery using the Seldinger technique.  Evaluation Blood flow good; BP tracing good. Complications: No apparent complications.   Sindia Kowalczyk 10/01/2014

## 2014-10-01 NOTE — Procedures (Signed)
CPR Note  Patient had a bradycardic cardiac arrest.  Please see code note.  ACLS followed and ROSC after 6 minutes.  Hypotensive post code and levophed started and vent adjusted.  Alyson ReedyWesam G. Grasiela Jonsson, M.D. [facility]. Pager: [phone]. After hours pager: [phone].

## 2014-10-01 NOTE — Code Documentation (Addendum)
Family at beside. Family given emotional support. 

## 2014-10-01 NOTE — ED Notes (Addendum)
Respiratory at bedside.  Dr. Lynelle DoctorKnapp intubation.

## 2014-10-01 NOTE — Code Documentation (Signed)
Family updated as to patient's status.

## 2014-10-01 NOTE — Progress Notes (Signed)
ANTIBIOTIC CONSULT NOTE - INITIAL  Pharmacy Consult for Cefepime Indication: Empiric HCAP coverage  Allergies  Allergen Reactions  . Penicillins     Rash     Patient Measurements:    Wt: 74.8 kg Ht: 65 inches  Vital Signs: Temp: 95.7 F (35.4 C) (11/13 1217) Temp Source: Other (Comment) (11/13 1032) BP: 49/27 mmHg (11/13 1145) Pulse Rate: 88 (11/13 1145) Intake/Output from previous day:   Intake/Output from this shift: Total I/O In: 3000 [I.V.:3000] Out: -   Labs:  Recent Labs  10/01/14 0950  CREATININE 1.28*   CrCl cannot be calculated (Unknown ideal weight.). No results for input(s): VANCOTROUGH, VANCOPEAK, VANCORANDOM, GENTTROUGH, GENTPEAK, GENTRANDOM, TOBRATROUGH, TOBRAPEAK, TOBRARND, AMIKACINPEAK, AMIKACINTROU, AMIKACIN in the last 72 hours.   Microbiology: No results found for this or any previous visit (from the past 720 hour(s)).  Medical History: Past Medical History  Diagnosis Date  . Mixed stress and urge urinary incontinence   . Schizophrenia   . Allergy     Assessment: [age] YOF brought into the [facility] with DKA requiring intubation by CCM to protect airway. Pharmacy consulted to start Cefepime for possible aspiration PNA along with Clinda per MD. SCr 1.28, CrCl~50-60 ml/min  Goal of Therapy:  Proper antibiotics for infection/cultures adjusted for renal/hepatic function   Plan:  1. Cefepime 1g IV every 8 hours 2. Will continue to follow renal function, culture results, LOT, and antibiotic de-escalation plans   Georgina PillionElizabeth Bravery Ketcham, PharmD, BCPS Clinical Pharmacist Pager: [phone] 10/01/2014 2:27 PM

## 2014-10-01 NOTE — ED Notes (Addendum)
CBG 523.  See MAR for orders.

## 2014-10-01 NOTE — ED Notes (Signed)
bair hugger placed on pt. Temp 34.5 degrees Celsius. Per temp foley

## 2014-10-01 NOTE — ED Notes (Signed)
Lars MageI Knapp, MD aware of abnormal lab test results

## 2014-10-01 NOTE — Consult Note (Signed)
Consultation  Referring Provider:  Critical Care Medicine    Primary Care Physician:  No PCP Per Patient Primary Gastroenterologist: Presumably none       Reason for Consultation:   Swallowed dentures           HPI:   [name] is a 63 y.o. female brought into ED for DKA. She was severely acidotic with altered mental status. CXR suggests multifocal PNA. Patient required intubation, currently on vent so unable to provide any history. Plain abdominal films show a radiopaque structure overlying sacrum. Husband retrieved one of the dentures from her mouth this am but couldn't find the other one.   Past Medical History  Diagnosis Date  . Mixed stress and urge urinary incontinence   . Schizophrenia   . Allergy     No past surgical history on file.  Family History  Problem Relation Age of Onset  . Heart disease Mother   . Emphysema Paternal Grandfather   . Hypertension Father   . Stroke Father   . Heart disease Father      History  Substance Use Topics  . Smoking status: Former Smoker -- 3.00 packs/day    Types: Cigarettes  . Smokeless tobacco: Not on file  . Alcohol Use: 3.6 oz/week    6 Cans of beer per week    Prior to Admission medications   Medication Sig Start Date End Date Taking? Authorizing Provider  ARIPiprazole (ABILIFY) 10 MG tablet Take 10 mg by mouth daily.    Historical Provider, MD  benztropine (COGENTIN) 0.5 MG tablet Take 0.5 mg by mouth 2 (two) times daily.     Historical Provider, MD  buPROPion (WELLBUTRIN XL) 150 MG 24 hr tablet Take 150 mg by mouth daily.    Historical Provider, MD  fluticasone (FLONASE) 50 MCG/ACT nasal spray Place 1 spray into both nostrils daily. 02/16/14   Historical Provider, MD  glucose monitoring kit (FREESTYLE) monitoring kit 1 each by Does not apply route 4 (four) times daily - after meals and at bedtime. 1 month Diabetic Testing Supplies for QAC-QHS accuchecks.Any brand OK 05/24/14   Thurnell Lose, MD  insulin  NPH-regular Human (NOVOLIN 70/30) (70-30) 100 UNIT/ML injection Inject 20 Units into the skin 2 (two) times daily with a meal. 05/24/14   Thurnell Lose, MD  lisinopril (PRINIVIL,ZESTRIL) 5 MG tablet Take 1 tablet (5 mg total) by mouth daily. 05/24/14   Thurnell Lose, MD  metFORMIN (GLUCOPHAGE) 500 MG tablet Take 1 tablet (500 mg total) by mouth 2 (two) times daily with a meal. 05/24/14   Thurnell Lose, MD  Multiple Vitamins-Minerals (MULTIVITAMIN PO) Take 1 tablet by mouth daily.    Historical Provider, MD  tretinoin (RETIN-A) 0.05 % cream Apply 1 application topically daily. 04/23/14   Historical Provider, MD    Current Facility-Administered Medications  Medication Dose Route Frequency Provider Last Rate Last Dose  . 0.9 %  sodium chloride infusion  1,000 mL Intravenous Continuous Janice Norrie, MD 999 mL/hr at 10/01/14 1134 1,000 mL at 10/01/14 1134  . 0.9 %  sodium chloride infusion   Intravenous Continuous Rush Farmer, MD      . 0.9 %  sodium chloride infusion   Intravenous Continuous Rush Farmer, MD      . ceFEPIme (MAXIPIME) 1 g in dextrose 5 % 50 mL IVPB  1 g Intravenous Once Rolla Flatten, [facility]      . dextrose 5 %-0.45 %  sodium chloride infusion   Intravenous Continuous Rush Farmer, MD      . dextrose 50 % solution 25 mL  25 mL Intravenous PRN Rush Farmer, MD      . EPINEPHrine (ADRENALIN) 0.1 MG/ML injection   Intravenous PRN Erick Colace, NP   1 mg at 10/01/14 1228  . fentaNYL (SUBLIMAZE) 0.05 MG/ML injection           . fentaNYL (SUBLIMAZE) injection 100 mcg  100 mcg Intravenous Q15 min PRN Rush Farmer, MD      . fentaNYL (SUBLIMAZE) injection 100 mcg  100 mcg Intravenous Q2H PRN Rush Farmer, MD      . heparin injection 5,000 Units  5,000 Units Subcutaneous 3 times per day Rush Farmer, MD      . hydrocortisone sodium succinate (SOLU-CORTEF) 100 MG injection 50 mg  50 mg Intravenous Q6H Rush Farmer, MD      . insulin regular (NOVOLIN R,HUMULIN R) 250 Units  in sodium chloride 0.9 % 250 mL (1 Units/mL) infusion   Intravenous Continuous Rush Farmer, MD      . lidocaine (cardiac) 100 mg/40m (XYLOCAINE) 20 MG/ML injection 2%           . midazolam (VERSED) 2 MG/2ML injection           . norepinephrine (LEVOPHED) 16 mg in dextrose 5 % 250 mL (0.064 mg/mL) infusion  2-50 mcg/min Intravenous Titrated TArty Baumgartner RPH 23.4 mL/hr at 10/01/14 1319 25 mcg/min at 10/01/14 1319  . rocuronium (ZEMURON) 50 MG/5ML injection            Current Outpatient Prescriptions  Medication Sig Dispense Refill  . ARIPiprazole (ABILIFY) 10 MG tablet Take 10 mg by mouth daily.    . benztropine (COGENTIN) 0.5 MG tablet Take 0.5 mg by mouth 2 (two) times daily.     .Marland KitchenbuPROPion (WELLBUTRIN XL) 150 MG 24 hr tablet Take 150 mg by mouth daily.    . fluticasone (FLONASE) 50 MCG/ACT nasal spray Place 1 spray into both nostrils daily.    .Marland Kitchenglucose monitoring kit (FREESTYLE) monitoring kit 1 each by Does not apply route 4 (four) times daily - after meals and at bedtime. 1 month Diabetic Testing Supplies for QAC-QHS accuchecks.Any brand OK 1 each 1  . insulin NPH-regular Human (NOVOLIN 70/30) (70-30) 100 UNIT/ML injection Inject 20 Units into the skin 2 (two) times daily with a meal. 10 mL 11  . lisinopril (PRINIVIL,ZESTRIL) 5 MG tablet Take 1 tablet (5 mg total) by mouth daily. 30 tablet 0  . metFORMIN (GLUCOPHAGE) 500 MG tablet Take 1 tablet (500 mg total) by mouth 2 (two) times daily with a meal. 60 tablet 0  . Multiple Vitamins-Minerals (MULTIVITAMIN PO) Take 1 tablet by mouth daily.    .Marland Kitchentretinoin (RETIN-A) 0.05 % cream Apply 1 application topically daily.      Allergies as of 10/01/2014 - Review Complete 05/23/2014  Allergen Reaction Noted  . Penicillins  12/18/2012    Review of Systems:    Unable to obtain, sedated on ventilator. .Marland Kitchen  Physical Exam:  Vital signs in last 24 hours: Temp:  [94.3 F (34.6 C)-95.7 F (35.4 C)] 95.7 F (35.4 C) (11/13 1217) Pulse  Rate:  [88-106] 88 (11/13 1145) Resp:  [16-25] 20 (11/13 1145) BP: (49-114)/(27-83) 49/27 mmHg (11/13 1145) SpO2:  [90 %-100 %] 97 % (11/13 1145) FiO2 (%):  [70 %-100 %] 100 % (11/13 1302)  General:   Morbidly obese white female Head:  Normocephalic and atraumatic. Lungs:  Chest  - coarse BS.  Heart:  Tachycardic in 120's Abdomen:  Soft, nondistended, nontender. Normal bowel sounds. No appreciable masses or hepatomegaly.  Rectal:  Not performed.  Msk:  Symmetrical without gross deformities.  Extremities:  Without edema. Neurologic:  sedated Skin:  Intact without significant lesions or rashes.  LAB RESULTS:  No results for input(s): WBC, HGB, HCT, PLT in the last 72 hours. BMET  Recent Labs  10/01/14 0950  NA 129*  K 5.1  CL 92*  CO2 <7*  GLUCOSE 595*  BUN 25*  CREATININE 1.28*  CALCIUM 9.7   LFT  Recent Labs  10/01/14 0950  PROT 7.1  ALBUMIN 2.4*  AST 37  ALT 14  ALKPHOS 121*  BILITOT 0.4    STUDIES: Dg Chest Portable 1 View  10/01/2014   CLINICAL DATA:  Shock, check ETT/ central line placement  EXAM: PORTABLE CHEST - 1 VIEW  COMPARISON:  10/01/2014 at 0954 hr  FINDINGS: Endotracheal tube terminates 2 cm above the carina.  Mild patchy right upper lobe and bilateral lower lobe opacities, suspicious for multifocal pneumonia. Possible small right pleural effusion.  The heart is normal in size.  Right subclavian venous catheter terminates at cavoatrial junction.  Enteric tube courses below the diaphragm.  Defibrillator pads overlying the left hemithorax.  IMPRESSION: Mild patchy opacities in the right upper lobe and bilateral lower lobes, suspicious for multifocal pneumonia. Possible small right pleural effusion.  Endotracheal tube terminates 2 cm above the carina.  Right subclavian venous catheter terminates at the cavoatrial junction.   Electronically Signed   By: Julian Hy M.D.   On: 10/01/2014 13:03   Dg Chest Portable 1 View  10/01/2014   CLINICAL DATA:  Endotracheal tube placement  EXAM: PORTABLE CHEST - 1 VIEW  COMPARISON:  None.  FINDINGS: Endotracheal tube tip between the clavicular heads and carina (approximately 15 mm above the carina). A gastric suction tube is coiled in the gastric fundus.  Normal heart size and mediastinal contours.  There is patchy bilateral airspace disease, most dense in the right mid chest and left base. Trace right pleural effusion is likely. No pneumothorax. No pulmonary edema.  IMPRESSION: 1. Endotracheal and orogastric tubes are in unremarkable position. 2. Bilateral airspace disease favoring pneumonia or aspiration.   Electronically Signed   By: Jorje Guild M.D.   On: 10/01/2014 10:47   Dg Abd Portable 1v  10/01/2014   CLINICAL DATA:  Missing denture  EXAM: PORTABLE ABDOMEN - 1 VIEW  COMPARISON:  May 22, 2014  FINDINGS: Nasogastric tube tip and side port are in the stomach. Bowel gas pattern is unremarkable. There is moderate stool in the colon. 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  There is a curvilinear radiopaque foreign body overlying the sacrum which was not present on prior study. The etiology of this structure is uncertain. This radiopacity does not have the classic appearance of a denture.  IMPRESSION: There is a radiopaque structure overlying the sacrum, not seen on prior study. The etiology of this structure is uncertain. This appearance is not classic for a denture. Given the circumstances, it may warrant correlation with noncontrast CT to further assess.  Bowel gas pattern unremarkable. Nasogastric tube tip and side port in stomach. 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   Electronically Signed   By: Lowella Grip M.D.   On: 10/01/2014 11:38  PREVIOUS ENDOSCOPIES:   none found in epic    Impression / Plan:   48. 63 year old female being admitted with DKA / severe acidosis / bradycardia with PEA arrest this am.   2. Foreign body ingestion. Husband found one of  patient's dentures in her mouth this am. He couldn't locate the other denture and plain films show radiopaque object overlying sacrum.  Will obtain noncontrast CTscan to get better idea of where dentures are located. If in the colon she may require colonoscopic extraction. If still in small bowel then would be concerned about potential for cecal perforation. Further recommendations to follow CTscan.     LOS: 0 days   Tye Savoy  10/01/2014, 1:39 PM    ________________________________________________________________________  Velora Heckler GI MD note:  I personally examined the patient, reviewed the data and agree with the assessment and plan described above.  She is critically ill, coded in ER due to acidosis.  Likely foreign body (dentures) somewhere in her low abdomen.  Trying to retrieve foreign body could be more dangerous than simply letting it try to pass on its own.  She will get CT non-contrast to best localize the FB and we will advise from there.    Owens Loffler, MD [facility] Pager [phone]

## 2014-10-01 NOTE — ED Provider Notes (Signed)
CSN: [id number]     Arrival date & time 10/01/14  3818 History   First MD Initiated Contact with Patient 10/01/14 [phone]     Chief Complaint  Patient presents with  . Diabetic Ketoacidosis   Level V caveat for altered mental status  (Consider location/radiation/quality/duration/timing/severity/associated sxs/prior Treatment) HPI  EMS report they were called to see the patient for altered mental status.they report they got a CBG of 551. Her husband reported she was not acting right last night. They report she was unresponsive this morning. They've put the patient on oxygen without a baseline pulse ox. They gave the patient Narcan 0.5 mg without change in mental status.  PCP [place]  Past Medical History  Diagnosis Date  . Mixed stress and urge urinary incontinence   . Schizophrenia   . Allergy    No past surgical history on file. Family History  Problem Relation Age of Onset  . Heart disease Mother   . Emphysema Paternal Grandfather   . Hypertension Father   . Stroke Father   . Heart disease Father    History  Substance Use Topics  . Smoking status: Former Smoker -- 3.00 packs/day    Types: Cigarettes  . Smokeless tobacco: Not on file  . Alcohol Use: 3.6 oz/week    6 Cans of beer per week   Employed as a truck driver  OB History    Gravida Para Term Preterm AB TAB SAB Ectopic Multiple Living   0              Review of Systems  Unable to perform ROS: Patient unresponsive      Allergies  Penicillins  Home Medications   Prior to Admission medications   Medication Sig Start Date End Date Taking? Authorizing Provider  ARIPiprazole (ABILIFY) 10 MG tablet Take 10 mg by mouth daily.    Historical Provider, MD  benztropine (COGENTIN) 0.5 MG tablet Take 0.5 mg by mouth 2 (two) times daily.     Historical Provider, MD  buPROPion (WELLBUTRIN XL) 150 MG 24 hr tablet Take 150 mg by mouth daily.    Historical Provider, MD  fluticasone (FLONASE) 50  MCG/ACT nasal spray Place 1 spray into both nostrils daily. 02/16/14   Historical Provider, MD  glucose monitoring kit (FREESTYLE) monitoring kit 1 each by Does not apply route 4 (four) times daily - after meals and at bedtime. 1 month Diabetic Testing Supplies for QAC-QHS accuchecks.Any brand OK 05/24/14   Thurnell Lose, MD  insulin NPH-regular Human (NOVOLIN 70/30) (70-30) 100 UNIT/ML injection Inject 20 Units into the skin 2 (two) times daily with a meal. 05/24/14   Thurnell Lose, MD  lisinopril (PRINIVIL,ZESTRIL) 5 MG tablet Take 1 tablet (5 mg total) by mouth daily. 05/24/14   Thurnell Lose, MD  metFORMIN (GLUCOPHAGE) 500 MG tablet Take 1 tablet (500 mg total) by mouth 2 (two) times daily with a meal. 05/24/14   Thurnell Lose, MD  Multiple Vitamins-Minerals (MULTIVITAMIN PO) Take 1 tablet by mouth daily.    Historical Provider, MD  tretinoin (RETIN-A) 0.05 % cream Apply 1 application topically daily. 04/23/14   Historical Provider, MD   Filed Vitals:   10/01/14 1217  BP:   Pulse:   Temp: 95.7 F (35.4 C)  Resp:   Pulse ox 100% on NRM  Vital signs normal    Physical Exam  Constitutional: She appears well-developed and well-nourished.  Non-toxic appearance. She does not appear ill. No distress.  HENT:  Head: Normocephalic and atraumatic.  Right Ear: External ear normal.  Left Ear: External ear normal.  Nose: Nose normal. No mucosal edema or rhinorrhea.  Mouth/Throat: Mucous membranes are normal. No dental abscesses or uvula swelling.  Pt is edentulous, MM are very dry  Eyes: Conjunctivae and EOM are normal. Pupils are equal, round, and reactive to light.  Neck: Normal range of motion and full passive range of motion without pain. Neck supple.  Cardiovascular: Normal rate, regular rhythm and normal heart sounds.  Exam reveals no gallop and no friction rub.   No murmur heard. Pulmonary/Chest: She is in respiratory distress. She has decreased breath sounds. She has no wheezes. She  has no rhonchi. She has rales. She exhibits no tenderness and no crepitus.  Patient has audible rales. She is having agonal type respirations.  Abdominal: Soft. Normal appearance and bowel sounds are normal. She exhibits no distension. There is no tenderness. There is no rebound and no guarding.  Musculoskeletal: Normal range of motion. She exhibits no edema or tenderness.  Moves all extremities well.   Neurological:  Patient is nonresponsive to painful stimuli, she does not respond to her name being called. She is limp and flaccid in her extremities  Skin: Skin is warm, dry and intact. No rash noted. No erythema. No pallor.  Psychiatric:  Pt not responsive  Nursing note and vitals reviewed.   ED Course  Procedures (including critical care time)  Medications  succinylcholine (ANECTINE) 20 MG/ML injection (100 mg  Given 10/01/14 0942)  etomidate (AMIDATE) 2 MG/ML injection (20 mg  Given 10/01/14 0941)  levofloxacin (LEVAQUIN) IVPB 750 mg (0 mg Intravenous Paused 10/01/14 1215)  clindamycin (CLEOCIN) IVPB 900 mg (0 mg Intravenous Stopped 10/01/14 1214)  fentaNYL (SUBLIMAZE) 0.05 MG/ML injection (100 mcg  Given 10/01/14 1205)  midazolam (VERSED) 2 MG/2ML injection (2 mg  Given 10/01/14 1205)     10:05 husband reports for the past month patient has had a 26 pound weight loss with polyuria and polydipsia. She was seen this week by endocrinologist who started her on insulin. He reports however she is not been compliant with her medications including her Januvia. He states they both have had a cough without fever for the past week. Last night she did not feel well. He states she seemed to be a little confused. This morning she would not wake up when he tried to wake her up. He reports she only had one of her dentures in place. He was unable to find the other one. He did take the other denture out left it at home.he states she was taken out of work this past week because of her elevated blood  sugar.  Patient was started on IV fluid replacement for her severe dehydration by exam and she was started on a insulin drip for her hyperglycemia and on subsequent blood work verifying her DKA.  Nurse reports hypothermia, pt start on BAIR hugger.  Review of her CXR shows bilateral infiltrates ? Aspiration, but pt has had respiratory symptoms for the past week. Will treat for CAP and poss aspiration.   11:16 husband given update, states she has been seen by cardiology twice for palpitations and was told she didn't have any heart problems.   Patient has had 3 L of IV fluids. She is starting to become hypotensive. A fourth liter was started. I did not start her on pressors at this point because I felt her hypotension was due to severe dehydration.  11:20  Dr Nelda Marseille will have someone come admit patient.   Husband informed that may have ingested her denture.   12:00 Pt had episode of PEA arrest. She received CPR, epi, bicarb and had return of pulse. Critical care in room.  INTUBATION Performed by: Rolland Porter L  Required items: required blood products, implants, devices, and special equipment available Patient identity confirmed: provided demographic data and hospital-assigned identification number Time out: Immediately prior to procedure a "time out" was called to verify the correct patient, procedure, equipment, support staff and site/side marked as required.  Indications: altered mental status and respiratory distress  Intubation method: Glidescope Laryngoscopy   Preoxygenation: BVM  Sedatives: 20 mg Etomidate Paralytic: 100 mg Succinylcholine  Tube Size: 7.5 cuffed  Post-procedure assessment: chest rise and ETCO2 monitor Breath sounds: equal and absent over the epigastrium Tube secured with: ETT holder Chest x-ray interpreted by radiologist and me.  Chest x-ray findings: endotracheal tube in appropriate position  Patient tolerated the procedure well with no immediate  complications.     Labs Review Results for orders placed or performed during the hospital encounter of 10/01/14  Comprehensive metabolic panel  Result Value Ref Range   Sodium 129 (L) 137 - 147 mEq/L   Potassium 5.1 3.7 - 5.3 mEq/L   Chloride 92 (L) 96 - 112 mEq/L   CO2 <7 (LL) 19 - 32 mEq/L   Glucose, Bld 595 (HH) 70 - 99 mg/dL   BUN 25 (H) 6 - 23 mg/dL   Creatinine, Ser 1.28 (H) 0.50 - 1.10 mg/dL   Calcium 9.7 8.4 - 10.5 mg/dL   Total Protein 7.1 6.0 - 8.3 g/dL   Albumin 2.4 (L) 3.5 - 5.2 g/dL   AST 37 0 - 37 U/L   ALT 14 0 - 35 U/L   Alkaline Phosphatase 121 (H) 39 - 117 U/L   Total Bilirubin 0.4 0.3 - 1.2 mg/dL   GFR calc non Af Amer 44 (L) >90 mL/min   GFR calc Af Amer 51 (L) >90 mL/min   Anion gap NOT CALCULATED 5 - 15  Urinalysis, Routine w reflex microscopic  Result Value Ref Range   Color, Urine YELLOW YELLOW   APPearance CLOUDY (A) CLEAR   Specific Gravity, Urine 1.024 1.005 - 1.030   pH 5.0 5.0 - 8.0   Glucose, UA >1000 (A) NEGATIVE mg/dL   Hgb urine dipstick MODERATE (A) NEGATIVE   Bilirubin Urine MODERATE (A) NEGATIVE   Ketones, ur >80 (A) NEGATIVE mg/dL   Protein, ur 100 (A) NEGATIVE mg/dL   Urobilinogen, UA 0.2 0.0 - 1.0 mg/dL   Nitrite NEGATIVE NEGATIVE   Leukocytes, UA NEGATIVE NEGATIVE  Acetaminophen level  Result Value Ref Range   Acetaminophen (Tylenol), Serum RESULTS UNAVAILABLE DUE TO INTERFERING SUBSTANCE 10 - 30 ug/mL  Salicylate level  Result Value Ref Range   Salicylate Lvl <1.0 (L) 2.8 - 20.0 mg/dL  Drug screen panel, emergency  Result Value Ref Range   Opiates NONE DETECTED NONE DETECTED   Cocaine NONE DETECTED NONE DETECTED   Benzodiazepines NONE DETECTED NONE DETECTED   Amphetamines NONE DETECTED NONE DETECTED   Tetrahydrocannabinol NONE DETECTED NONE DETECTED   Barbiturates NONE DETECTED NONE DETECTED  Pro b natriuretic peptide  Result Value Ref Range   Pro B Natriuretic peptide (BNP) 2023.0 (H) 0 - 125 pg/mL  Urine  microscopic-add on  Result Value Ref Range   Squamous Epithelial / LPF RARE RARE   WBC, UA 0-2 <3 WBC/hpf   RBC / HPF 3-6 <  3 RBC/hpf   Bacteria, UA FEW (A) RARE   Casts GRANULAR CAST (A) NEGATIVE   Urine-Other AMORPHOUS URATES/PHOSPHATES   I-stat troponin, ED  Result Value Ref Range   Troponin i, poc 0.15 (HH) 0.00 - 0.08 ng/mL   Comment NOTIFIED PHYSICIAN    Comment 3          I-Stat CG4 Lactic Acid, ED  Result Value Ref Range   Lactic Acid, Venous 1.28 0.5 - 2.2 mmol/L  I-Stat Arterial Blood Gas, ED - (order at [facility] and [facility] only)  Result Value Ref Range   pH, Arterial 6.864 (LL) 7.350 - 7.450   pCO2 arterial 38.3 35.0 - 45.0 mmHg   pO2, Arterial 471.0 (H) 80.0 - 100.0 mmHg   Bicarbonate 7.2 (L) 20.0 - 24.0 mEq/L   TCO2 9 0 - 100 mmol/L   O2 Saturation 100.0 %   Acid-base deficit 27.0 (H) 0.0 - 2.0 mmol/L   Patient temperature 34.5 C    Collection site RADIAL, ALLEN'S TEST ACCEPTABLE    Drawn by RT    Sample type ARTERIAL    Comment NOTIFIED PHYSICIAN   CBG monitoring, ED  Result Value Ref Range   Glucose-Capillary 518 (H) 70 - 99 mg/dL  CBG monitoring, ED  Result Value Ref Range   Glucose-Capillary 523 (H) 70 - 99 mg/dL  CBG monitoring, ED  Result Value Ref Range   Glucose-Capillary 422 (H) 70 - 99 mg/dL  I-Stat arterial blood gas, ED  Result Value Ref Range   pH, Arterial 6.934 (LL) 7.350 - 7.450   pCO2 arterial 35.8 35.0 - 45.0 mmHg   pO2, Arterial 321.0 (H) 80.0 - 100.0 mmHg   Bicarbonate 7.8 (L) 20.0 - 24.0 mEq/L   TCO2 9 0 - 100 mmol/L   O2 Saturation 100.0 %   Acid-base deficit 24.0 (H) 0.0 - 2.0 mmol/L   Patient temperature 35.6 C    Collection site ARTERIAL LINE    Drawn by RT    Sample type ARTERIAL    Comment NOTIFIED PHYSICIAN    Laboratory interpretation all normal except + troponin, DKA, Renal insufficiency,severe metabolic acidosis     Imaging Review  Dg Chest Portable 1 View  10/01/2014   CLINICAL DATA: Endotracheal tube placement  EXAM:  PORTABLE CHEST - 1 VIEW  COMPARISON:  None.  FINDINGS: Endotracheal tube tip between the clavicular heads and carina (approximately 15 mm above the carina). A gastric suction tube is coiled in the gastric fundus.  Normal heart size and mediastinal contours.  There is patchy bilateral airspace disease, most dense in the right mid chest and left base. Trace right pleural effusion is likely. No pneumothorax. No pulmonary edema.  IMPRESSION: 1. Endotracheal and orogastric tubes are in unremarkable position. 2. Bilateral airspace disease favoring pneumonia or aspiration.   Electronically Signed   By: Jorje Guild M.D.   On: 10/01/2014 10:47   Dg Abd Portable 1v  10/01/2014   CLINICAL DATA:  Missing denture  EXAM: PORTABLE ABDOMEN - 1 VIEW  COMPARISON:  May 22, 2014  FINDINGS: Nasogastric tube tip and side port are in the stomach. Bowel gas pattern is unremarkable. There is moderate stool in the colon. 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  There is a curvilinear radiopaque foreign body overlying the sacrum which was not present on prior study. The etiology of this structure is uncertain. This radiopacity does not have the classic appearance of a denture.  IMPRESSION: There is a  radiopaque structure overlying the sacrum, not seen on prior study. The etiology of this structure is uncertain. This appearance is not classic for a denture. Given the circumstances, it may warrant correlation with noncontrast CT to further assess.  Bowel gas pattern unremarkable. Nasogastric tube tip and side port in stomach. 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   Electronically Signed   By: Lowella Grip M.D.   On: 10/01/2014 11:38     EKG Interpretation   Date/Time:  Friday October 01 2014 09:36:17 EST Ventricular Rate:  102 PR Interval:  163 QRS Duration: 87 QT Interval:  407 QTC Calculation: 530 R Axis:   23 Text Interpretation:  Age not entered, assumed to be  62 years old for   purpose of ECG interpretation Sinus tachycardia Probable left atrial  enlargement Anterior infarct, old Prolonged QT interval Since last tracing  rate faster (03 Feb 2009) Confirmed by [facility]  MD-I,  ([id number]) on  10/01/2014 9:58:48 AM      MDM   Final diagnoses:  Endotracheally intubated  Altered mental status  Hypothermia, initial encounter  Diabetic ketoacidosis with coma associated with type 2 diabetes mellitus  Community acquired pneumonia  Metabolic acidosis  Dehydration  Shock  Foreign body ingestion, initial encounter  Acute respiratory failure, unspecified whether with hypoxia or hypercapnia    Plan admission   Rolland Porter, MD, FACEP   CRITICAL CARE Performed by: Rolland Porter L Total critical care time: 38 min Critical care time was exclusive of separately billable procedures and treating other patients. Critical care was necessary to treat or prevent imminent or life-threatening deterioration. Critical care was time spent personally by me on the following activities: development of treatment plan with patient and/or surrogate as well as nursing, discussions with consultants, evaluation of patient's response to treatment, examination of patient, obtaining history from patient or surrogate, ordering and performing treatments and interventions, ordering and review of laboratory studies, ordering and review of radiographic studies, pulse oximetry and re-evaluation of patient's condition.     Janice Norrie, MD 10/01/14 1426

## 2014-10-01 NOTE — ED Notes (Signed)
Critical care at bedside, starting central line

## 2014-10-01 NOTE — ED Notes (Signed)
Placed size 14 french temp foley in to patient clear yellow urine in return

## 2014-10-01 NOTE — Progress Notes (Signed)
Pt's watch given to her husband

## 2014-10-01 NOTE — Progress Notes (Signed)
Called back by RN, patient bradycardic then developed PEA arrest.  ACLS protocol followed.  ROSC.  ABG ordered, severely acidotic.  Bicarb started and RR increased inorder to address acidosis until DKA resolves.  Head CT ordered.  Will not call cardiology yet as patient is likely severely acidotic resulting in cardiac arrest.  Additional CC time by me of 30 min.  Abigail ReedyWesam G. Yacoub, M.D. [facility]. Pager: [phone]. After hours pager: [phone].

## 2014-10-01 NOTE — Progress Notes (Signed)
[facility]  Called to bedside by RN to evaluate refractory shock  ON exam:  Filed Vitals:   10/01/14 1400 10/01/14 1415 10/01/14 1545 10/01/14 1600  BP: 82/42 93/47 88/61    Pulse: 122 124 130 124  Temp:      TempSrc:      Resp: 14 17 36 20  Weight:    65.7 kg (144 lb 13.5 oz)  SpO2: 99% 100% 100% 92%   Gen; tachypnic on vent, minimally responsive HEENT: NCAT, ETT PULM: crackles, diminished but equal breath sounds bilaterally CV: Tachy, regular AB: infrequent bowel sounds  Ext: cool Neuro: minimally responsive  Impression: 1) Severe refractory acidosis, currently with evidence of ketoacidosis 2) DKA 3) Refractory shock 4) Acute encephalopathy 5) S/P cardiac arrest, at risk for anoxic brain injury  Plan: 1) Insulin gtt per DKA protocol 2) Bicarb gtt given severe acidosis 3) Stat repeat BMET, CBC, lactic acid, troponin, cortisol 4) q2h BMET per protocol 5) PAD sedation protocol  Additional CC time by me 35 minutes  Heber CarolinaBrent McQuaid, MD [facility] Pager: [phone] Cell: [phone] If no response, call [phone]

## 2014-10-01 NOTE — ED Notes (Signed)
Patient coming from home with DKA.  CBG of 551, BP 110/54, pulse 104.  Patient only able to moan and grunt with rales.  Patient given 0.5 Narcan with no response.

## 2014-10-02 ENCOUNTER — Inpatient Hospital Stay (HOSPITAL_COMMUNITY): Payer: Medicare Other

## 2014-10-02 DIAGNOSIS — I319 Disease of pericardium, unspecified: Secondary | ICD-10-CM

## 2014-10-02 DIAGNOSIS — J9600 Acute respiratory failure, unspecified whether with hypoxia or hypercapnia: Secondary | ICD-10-CM

## 2014-10-02 DIAGNOSIS — A419 Sepsis, unspecified organism: Secondary | ICD-10-CM

## 2014-10-02 DIAGNOSIS — R6521 Severe sepsis with septic shock: Secondary | ICD-10-CM

## 2014-10-02 DIAGNOSIS — R40244 Other coma, without documented Glasgow coma scale score, or with partial score reported: Secondary | ICD-10-CM

## 2014-10-02 DIAGNOSIS — G9340 Encephalopathy, unspecified: Secondary | ICD-10-CM

## 2014-10-02 LAB — GLUCOSE, CAPILLARY
GLUCOSE-CAPILLARY: 377 mg/dL — AB (ref 70–99)
GLUCOSE-CAPILLARY: 328 mg/dL — AB (ref 70–99)
GLUCOSE-CAPILLARY: 307 mg/dL — AB (ref 70–99)
GLUCOSE-CAPILLARY: 275 mg/dL — AB (ref 70–99)
GLUCOSE-CAPILLARY: 224 mg/dL — AB (ref 70–99)
GLUCOSE-CAPILLARY: 207 mg/dL — AB (ref 70–99)
Glucose-Capillary: 325 mg/dL — ABNORMAL HIGH (ref 70–99)
Glucose-Capillary: 339 mg/dL — ABNORMAL HIGH (ref 70–99)
Glucose-Capillary: 349 mg/dL — ABNORMAL HIGH (ref 70–99)
Glucose-Capillary: 386 mg/dL — ABNORMAL HIGH (ref 70–99)
Glucose-Capillary: 397 mg/dL — ABNORMAL HIGH (ref 70–99)
Glucose-Capillary: 404 mg/dL — ABNORMAL HIGH (ref 70–99)
Glucose-Capillary: 418 mg/dL — ABNORMAL HIGH (ref 70–99)
Glucose-Capillary: 336 mg/dL — ABNORMAL HIGH (ref 70–99)
Glucose-Capillary: 329 mg/dL — ABNORMAL HIGH (ref 70–99)
Glucose-Capillary: 336 mg/dL — ABNORMAL HIGH (ref 70–99)
Glucose-Capillary: 341 mg/dL — ABNORMAL HIGH (ref 70–99)
Glucose-Capillary: 371 mg/dL — ABNORMAL HIGH (ref 70–99)
Glucose-Capillary: 377 mg/dL — ABNORMAL HIGH (ref 70–99)
Glucose-Capillary: 378 mg/dL — ABNORMAL HIGH (ref 70–99)
Glucose-Capillary: 381 mg/dL — ABNORMAL HIGH (ref 70–99)
Glucose-Capillary: 244 mg/dL — ABNORMAL HIGH (ref 70–99)
Glucose-Capillary: 267 mg/dL — ABNORMAL HIGH (ref 70–99)
Glucose-Capillary: 179 mg/dL — ABNORMAL HIGH (ref 70–99)
Glucose-Capillary: 224 mg/dL — ABNORMAL HIGH (ref 70–99)
Glucose-Capillary: 185 mg/dL — ABNORMAL HIGH (ref 70–99)

## 2014-10-02 LAB — BASIC METABOLIC PANEL
ANION GAP: 15 (ref 5–15)
Anion gap: 13 (ref 5–15)
Anion gap: 15 (ref 5–15)
Anion gap: 17 — ABNORMAL HIGH (ref 5–15)
Anion gap: 17 — ABNORMAL HIGH (ref 5–15)
Anion gap: 18 — ABNORMAL HIGH (ref 5–15)
Anion gap: 18 — ABNORMAL HIGH (ref 5–15)
BUN: 30 mg/dL — AB (ref 6–23)
BUN: 30 mg/dL — ABNORMAL HIGH (ref 6–23)
BUN: 33 mg/dL — AB (ref 6–23)
BUN: 35 mg/dL — AB (ref 6–23)
BUN: 35 mg/dL — AB (ref 6–23)
BUN: 32 mg/dL — ABNORMAL HIGH (ref 6–23)
BUN: 30 mg/dL — AB (ref 6–23)
CALCIUM: 7.9 mg/dL — AB (ref 8.4–10.5)
CHLORIDE: 100 meq/L (ref 96–112)
CHLORIDE: 102 meq/L (ref 96–112)
CO2: 13 mEq/L — ABNORMAL LOW (ref 19–32)
CO2: 14 mEq/L — ABNORMAL LOW (ref 19–32)
CO2: 14 mEq/L — ABNORMAL LOW (ref 19–32)
CO2: 14 mEq/L — ABNORMAL LOW (ref 19–32)
CO2: 16 meq/L — AB (ref 19–32)
CO2: 17 mEq/L — ABNORMAL LOW (ref 19–32)
CO2: 18 meq/L — AB (ref 19–32)
CREATININE: 1.8 mg/dL — AB (ref 0.50–1.10)
Calcium: 7.9 mg/dL — ABNORMAL LOW (ref 8.4–10.5)
Calcium: 7.7 mg/dL — ABNORMAL LOW (ref 8.4–10.5)
Calcium: 8 mg/dL — ABNORMAL LOW (ref 8.4–10.5)
Calcium: 8 mg/dL — ABNORMAL LOW (ref 8.4–10.5)
Calcium: 8.2 mg/dL — ABNORMAL LOW (ref 8.4–10.5)
Calcium: 8.3 mg/dL — ABNORMAL LOW (ref 8.4–10.5)
Chloride: 103 mEq/L (ref 96–112)
Chloride: 104 mEq/L (ref 96–112)
Chloride: 107 mEq/L (ref 96–112)
Chloride: 108 mEq/L (ref 96–112)
Chloride: 109 mEq/L (ref 96–112)
Creatinine, Ser: 1.85 mg/dL — ABNORMAL HIGH (ref 0.50–1.10)
Creatinine, Ser: 1.99 mg/dL — ABNORMAL HIGH (ref 0.50–1.10)
Creatinine, Ser: 2.02 mg/dL — ABNORMAL HIGH (ref 0.50–1.10)
Creatinine, Ser: 2.24 mg/dL — ABNORMAL HIGH (ref 0.50–1.10)
Creatinine, Ser: 2.33 mg/dL — ABNORMAL HIGH (ref 0.50–1.10)
Creatinine, Ser: 2.6 mg/dL — ABNORMAL HIGH (ref 0.50–1.10)
GFR calc Af Amer: 22 mL/min — ABNORMAL LOW (ref >90)
GFR calc Af Amer: 25 mL/min — ABNORMAL LOW (ref >90)
GFR calc Af Amer: 26 mL/min — ABNORMAL LOW (ref >90)
GFR calc Af Amer: 29 mL/min — ABNORMAL LOW (ref >90)
GFR calc Af Amer: 30 mL/min — ABNORMAL LOW (ref >90)
GFR calc Af Amer: 33 mL/min — ABNORMAL LOW (ref >90)
GFR calc Af Amer: 34 mL/min — ABNORMAL LOW (ref >90)
GFR calc non Af Amer: 19 mL/min — ABNORMAL LOW (ref >90)
GFR calc non Af Amer: 21 mL/min — ABNORMAL LOW (ref >90)
GFR calc non Af Amer: 29 mL/min — ABNORMAL LOW (ref >90)
GFR, EST NON AFRICAN AMERICAN: 28 mL/min — AB (ref >90)
GFR, EST NON AFRICAN AMERICAN: 26 mL/min — AB (ref >90)
GFR, EST NON AFRICAN AMERICAN: 25 mL/min — AB (ref >90)
GFR, EST NON AFRICAN AMERICAN: 22 mL/min — AB (ref >90)
GLUCOSE: 251 mg/dL — AB (ref 70–99)
GLUCOSE: 357 mg/dL — AB (ref 70–99)
GLUCOSE: 419 mg/dL — AB (ref 70–99)
GLUCOSE: 403 mg/dL — AB (ref 70–99)
Glucose, Bld: 404 mg/dL — ABNORMAL HIGH (ref 70–99)
Glucose, Bld: 400 mg/dL — ABNORMAL HIGH (ref 70–99)
Glucose, Bld: 208 mg/dL — ABNORMAL HIGH (ref 70–99)
POTASSIUM: 3.1 meq/L — AB (ref 3.7–5.3)
POTASSIUM: 2.9 meq/L — AB (ref 3.7–5.3)
Potassium: 2.6 mEq/L — CL (ref 3.7–5.3)
Potassium: 3.2 mEq/L — ABNORMAL LOW (ref 3.7–5.3)
Potassium: 3.5 mEq/L — ABNORMAL LOW (ref 3.7–5.3)
Potassium: 3.8 mEq/L (ref 3.7–5.3)
Potassium: 3.9 mEq/L (ref 3.7–5.3)
SODIUM: 137 meq/L (ref 137–147)
Sodium: 132 mEq/L — ABNORMAL LOW (ref 137–147)
Sodium: 133 mEq/L — ABNORMAL LOW (ref 137–147)
Sodium: 135 mEq/L — ABNORMAL LOW (ref 137–147)
Sodium: 137 mEq/L (ref 137–147)
Sodium: 139 mEq/L (ref 137–147)
Sodium: 139 mEq/L (ref 137–147)

## 2014-10-02 LAB — POCT I-STAT 3, ART BLOOD GAS (G3+)
Acid-base deficit: 15 mmol/L — ABNORMAL HIGH (ref 0.0–2.0)
Acid-base deficit: 15 mmol/L — ABNORMAL HIGH (ref 0.0–2.0)
Acid-base deficit: 12 mmol/L — ABNORMAL HIGH (ref 0.0–2.0)
BICARBONATE: 14.3 meq/L — AB (ref 20.0–24.0)
Bicarbonate: 13.8 mEq/L — ABNORMAL LOW (ref 20.0–24.0)
Bicarbonate: 17.6 mEq/L — ABNORMAL LOW (ref 20.0–24.0)
O2 SAT: 86 %
O2 SAT: 91 %
O2 Saturation: 86 %
PCO2 ART: 44.5 mmHg (ref 35.0–45.0)
PCO2 ART: 54.2 mmHg — AB (ref 35.0–45.0)
PH ART: 7.117 — AB (ref 7.350–7.450)
PH ART: 7.121 — AB (ref 7.350–7.450)
Patient temperature: 99.3
Patient temperature: 98.9
Patient temperature: 99.1
TCO2: 15 mmol/L (ref 0–100)
TCO2: 16 mmol/L (ref 0–100)
TCO2: 19 mmol/L (ref 0–100)
pCO2 arterial: 43 mmHg (ref 35.0–45.0)
pH, Arterial: 7.114 — CL (ref 7.350–7.450)
pO2, Arterial: 70 mmHg — ABNORMAL LOW (ref 80.0–100.0)
pO2, Arterial: 70 mmHg — ABNORMAL LOW (ref 80.0–100.0)
pO2, Arterial: 81 mmHg (ref 80.0–100.0)

## 2014-10-02 LAB — URINALYSIS, ROUTINE W REFLEX MICROSCOPIC
Ketones, ur: 40 mg/dL — AB
Leukocytes, UA: NEGATIVE
Nitrite: NEGATIVE
Protein, ur: 100 mg/dL — AB
SPECIFIC GRAVITY, URINE: 1.025 (ref 1.005–1.030)
Urobilinogen, UA: 1 mg/dL (ref 0.0–1.0)
pH: 5 (ref 5.0–8.0)

## 2014-10-02 LAB — CORTISOL: Cortisol, Plasma: 61.6 ug/dL

## 2014-10-02 LAB — URINE CULTURE
Colony Count: NO GROWTH
Culture: NO GROWTH

## 2014-10-02 LAB — CBC
HCT: 34.7 % — ABNORMAL LOW (ref 36.0–46.0)
Hemoglobin: 11.7 g/dL — ABNORMAL LOW (ref 12.0–15.0)
MCH: 26.3 pg (ref 26.0–34.0)
MCHC: 33.7 g/dL (ref 30.0–36.0)
MCV: 78 fL (ref 78.0–100.0)
PLATELETS: 152 10*3/uL (ref 150–400)
RBC: 4.45 MIL/uL (ref 3.87–5.11)
RDW: 15.8 % — ABNORMAL HIGH (ref 11.5–15.5)
WBC: 7.6 10*3/uL (ref 4.0–10.5)

## 2014-10-02 LAB — URINE MICROSCOPIC-ADD ON

## 2014-10-02 LAB — PHOSPHORUS: Phosphorus: <0.5 mg/dL — CL (ref 2.3–4.6)

## 2014-10-02 LAB — MAGNESIUM: MAGNESIUM: 1.8 mg/dL (ref 1.5–2.5)

## 2014-10-02 LAB — TROPONIN I: Troponin I: <0.3 ng/mL (ref <0.30)

## 2014-10-02 MED ORDER — VANCOMYCIN HCL IN DEXTROSE 1-5 GM/200ML-% IV SOLN
1000.0000 mg | INTRAVENOUS | Status: DC
  Administered 2014-10-02 – 2014-10-03 (×2): 1000 mg via INTRAVENOUS
  Filled 2014-10-02 (×3): qty 200

## 2014-10-02 MED ORDER — POTASSIUM CHLORIDE 20 MEQ/15ML (10%) PO SOLN
40.0000 meq | Freq: Once | ORAL | Status: AC
  Administered 2014-10-02: 40 meq
  Filled 2014-10-02: qty 30

## 2014-10-02 MED ORDER — SODIUM CHLORIDE 0.9 % IV BOLUS (SEPSIS)
750.0000 mL | Freq: Once | INTRAVENOUS | Status: AC
  Administered 2014-10-02: 750 mL via INTRAVENOUS

## 2014-10-02 MED ORDER — SODIUM CHLORIDE 0.9 % IV BOLUS (SEPSIS)
1000.0000 mL | Freq: Once | INTRAVENOUS | Status: AC
  Administered 2014-10-02: 1000 mL via INTRAVENOUS

## 2014-10-02 MED ORDER — FENTANYL BOLUS VIA INFUSION
50.0000 ug | INTRAVENOUS | Status: DC | PRN
  Administered 2014-10-03 – 2014-10-04 (×6): 50 ug via INTRAVENOUS
  Administered 2014-10-04: 100 ug via INTRAVENOUS
  Administered 2014-10-04: 50 ug via INTRAVENOUS
  Administered 2014-10-04 (×2): 100 ug via INTRAVENOUS
  Administered 2014-10-04: 50 ug via INTRAVENOUS
  Administered 2014-10-05: 100 ug via INTRAVENOUS
  Filled 2014-10-02: qty 100

## 2014-10-02 MED ORDER — SODIUM CHLORIDE 0.9 % IV SOLN
0.0000 ug/h | INTRAVENOUS | Status: DC
  Administered 2014-09-30: 100 ug/h via INTRAVENOUS
  Administered 2014-10-02: 50 ug/h via INTRAVENOUS
  Administered 2014-10-05: 75 ug/h via INTRAVENOUS
  Administered 2014-10-05: 200 ug/h via INTRAVENOUS
  Administered 2014-10-07: 100 ug/h via INTRAVENOUS
  Administered 2014-10-08: 150 ug/h via INTRAVENOUS
  Administered 2014-10-09 – 2014-10-10 (×2): 100 ug/h via INTRAVENOUS
  Filled 2014-10-02 (×10): qty 50

## 2014-10-02 MED ORDER — ACETAMINOPHEN 160 MG/5ML PO SOLN
650.0000 mg | ORAL | Status: DC | PRN
  Administered 2014-10-02: 650 mg
  Filled 2014-10-02: qty 20.3

## 2014-10-02 MED ORDER — FENTANYL CITRATE 0.05 MG/ML IJ SOLN: 50.0000 ug | Freq: Once | INTRAMUSCULAR | Status: AC

## 2014-10-02 MED ORDER — POTASSIUM CHLORIDE 10 MEQ/50ML IV SOLN
INTRAVENOUS | Status: AC
  Filled 2014-10-02: qty 50

## 2014-10-02 MED ORDER — DEXTROSE 5 % IV SOLN
1.0000 g | INTRAVENOUS | Status: DC
  Administered 2014-10-03 – 2014-10-04 (×2): 1 g via INTRAVENOUS
  Filled 2014-10-02 (×2): qty 1

## 2014-10-02 MED ORDER — POTASSIUM CHLORIDE 10 MEQ/50ML IV SOLN
INTRAVENOUS | Status: AC
  Administered 2014-10-02: 10 meq
  Filled 2014-10-02: qty 50

## 2014-10-02 MED ORDER — MIDAZOLAM HCL 2 MG/2ML IJ SOLN
1.0000 mg | INTRAMUSCULAR | Status: DC | PRN
  Administered 2014-10-02 – 2014-10-10 (×9): 1 mg via INTRAVENOUS
  Filled 2014-10-02 (×9): qty 2

## 2014-10-02 MED ORDER — POTASSIUM PHOSPHATES 15 MMOLE/5ML IV SOLN
40.0000 meq | Freq: Once | INTRAVENOUS | Status: AC
  Administered 2014-10-02: 40 meq via INTRAVENOUS
  Filled 2014-10-02: qty 9.09

## 2014-10-02 MED ORDER — SODIUM CHLORIDE 0.45 % IV SOLN
INTRAVENOUS | Status: DC
  Administered 2014-10-02: 13:00:00 via INTRAVENOUS

## 2014-10-02 MED ORDER — POTASSIUM CHLORIDE 10 MEQ/50ML IV SOLN
10.0000 meq | INTRAVENOUS | Status: AC
  Administered 2014-10-02 (×2): 10 meq via INTRAVENOUS
  Filled 2014-10-02: qty 50

## 2014-10-02 MED ORDER — POTASSIUM CHLORIDE 10 MEQ/50ML IV SOLN
10.0000 meq | INTRAVENOUS | Status: AC
  Administered 2014-10-02 (×4): 10 meq via INTRAVENOUS
  Filled 2014-10-02 (×2): qty 50

## 2014-10-02 MED ORDER — MIDAZOLAM HCL 2 MG/2ML IJ SOLN
2.0000 mg | Freq: Once | INTRAMUSCULAR | Status: AC
  Administered 2014-10-02: 2 mg via INTRAVENOUS

## 2014-10-02 MED ORDER — MIDAZOLAM HCL 2 MG/2ML IJ SOLN
INTRAMUSCULAR | Status: AC
  Filled 2014-10-02: qty 2

## 2014-10-02 NOTE — Progress Notes (Signed)
INITIAL NUTRITION ASSESSMENT  DOCUMENTATION CODES Per approved criteria  -Not Applicable   INTERVENTION: If unable to extubate within 24-48 hours and once blood glucose is stable at adequate levels, recommend Initiate TF via OGT with Glucerna 1.2 at 25 ml/h and Prostat 30 ml BID on day 1; on day 2, increase to goal rate of 1200 ml/h (50 ml per day) to provide 1400 kcals, 86 gm protein, 972 ml free water daily.  NUTRITION DIAGNOSIS: Inadequate oral intake related to inability to eat as evidenced by NPO status  Goal: Pt to meet >/= 90% of their estimated nutrition needs   Monitor:  Vent status, TF initiation and tolerance (if unable to extubate), weight trends, labs  Reason for Assessment: [name]  63 y.o. female  Admitting Dx: Altered Mental Status  ASSESSMENT: 63 year old female with PMH of diabetes and medical non-compliance.Husband woke up this AM and was unable to arouse patient. In the ED patient became unresponsive and was intubated. Pt presents with AMS, DKA, and [name].  Patient is currently intubated on ventilator support MV: 9.8 L/min Temp (24hrs), Avg:98.8 F (37.1 C), Min:94.3 F (34.6 C), Max:102.3 F (39.1 C)  Propofol: none  No family at bedside. Unable to obtain nutrition hx at this time. Pt with no observed significant fat or muscle mass loss. Spoke with RN, she reports may consider initiation of TF tomorrow morning if CBG's stay below 300 mg/dL.  Labs: Low sodium, potassium, CO2, calcium, and GFR. High BUN and creatinine.  CBG's 328-474 mg/dL.  Height: Ht Readings from Last 1 Encounters:  05/22/14 5\' 5"  (1.651 m)    Weight: Wt Readings from Last 1 Encounters:  10/01/14 144 lb 13.5 oz (65.7 kg)    Ideal Body Weight: 125 lbs  % Ideal Body Weight: 115%  Wt Readings from Last 10 Encounters:  10/01/14 144 lb 13.5 oz (65.7 kg)  05/22/14 165 lb (74.844 kg)  05/22/14 163 lb (73.936 kg)  12/18/12 187 lb (84.823 kg)    Usual Body Weight: 165 lbs in  June 2015  % Usual Body Weight: 87%  BMI:  Body mass index is 24.1 kg/(m^2).  Estimated Nutritional Needs: Kcal: 1466 Protein: 80-95 grams Fluid: >/= 1.5 L/day  Skin: intact  Diet Order: Diet NPO time specified  EDUCATION NEEDS: -Education not appropriate at this time   Intake/Output Summary (Last 24 hours) at 10/02/14 1018 Last data filed at 10/02/14 1000  Gross per 24 hour  Intake 12198.29 ml  Output   1095 ml  Net 11103.29 ml    Last BM: PTA  Labs:   Recent Labs Lab 10/01/14 1649  10/02/14 10/02/14 0200 10/02/14 0600  NA 140  < > 139 137 139  K 3.5*  < > 3.2* 2.6* 3.1*  CL 108  < > 109 108 107  CO2 11*  < > 13* 14* 14*  BUN 27*  < > 30* 30* 30*  CREATININE 1.34*  < > 1.80* 1.85* 1.99*  CALCIUM 7.4*  < > 7.9* 7.9* 7.7*  MG 1.6  --   --  1.8  --   PHOS 2.4  --   --  <0.5*  --   GLUCOSE 474*  < > 403* 419* 404*  < > = values in this interval not displayed.  CBG (last 3)   Recent Labs  10/02/14 0520 10/02/14 0610 10/02/14 0715  GLUCAP 328* 349* 336*    Scheduled Meds: . antiseptic oral rinse  7 mL Mouth Rinse QID  .  ceFEPime (MAXIPIME) IV  1 g Intravenous Q8H  . chlorhexidine  15 mL Mouth Rinse BID  . heparin  5,000 Units Subcutaneous 3 times per day  . hydrocortisone sodium succinate  50 mg Intravenous Q6H  . pantoprazole (PROTONIX) IV  40 mg Intravenous Q24H    Continuous Infusions: . sodium chloride Stopped (10/02/14 0047)  . sodium chloride 10 mL/hr at 10/02/14 1000  . dextrose 5 % and 0.45% NaCl Stopped (10/02/14 0900)  . fentaNYL infusion INTRAVENOUS 125 mcg/hr (10/02/14 1000)  . insulin (NOVOLIN-R) infusion 9.5 Units/hr (10/02/14 1000)  . norepinephrine (LEVOPHED) Adult infusion 15.04 mcg/min (10/02/14 1000)  . phenylephrine (NEO-SYNEPHRINE) Adult infusion 160 mcg/min (10/02/14 1000)  .  sodium bicarbonate  infusion 1000 mL 150 mL/hr at 10/02/14 1000  . vasopressin (PITRESSIN) infusion - *FOR SHOCK* 0.03 Units/min (10/02/14 1000)     Past Medical History  Diagnosis Date  . Mixed stress and urge urinary incontinence   . Schizophrenia   . Allergy     No past surgical history on file.  Marijean NiemannStephanie La, MS, RD, LDN Pager # [phone] After hours/ weekend pager # [phone]

## 2014-10-02 NOTE — Progress Notes (Signed)
CRITICAL VALUE ALERT  Critical value received:  K+2.9 Date of notification:  10/02/2014  Time of notification:  1055 Critical value read back:Yes.    Nurse who received alert:  [id number]   MD notified (1st page):T.Parrett, NP  Time of first page:  1055  MD notified (2nd page):  Time of second page:  Responding MD:  Andrey Campanile. Parrett NP  Time MD responded:

## 2014-10-02 NOTE — Progress Notes (Signed)
[facility]-RT notified.

## 2014-10-02 NOTE — H&P (Signed)
PULMONARY / CRITICAL CARE MEDICINE   Name: [name] MRN: [id number] DOB: [birth year]    ADMISSION DATE:  10/01/2014 CONSULTATION DATE:  10/01/2014  REFERRING MD :  EDP  CHIEF COMPLAINT:  DKA, respiratory failure and AMS  INITIAL PRESENTATION: 63 year old female with PMH of diabetes and medical non-compliance who was last seen normal the day PTP at 8 PM.  Husband woke up this AM and was unable to arouse patient.  Evidently both have been "fighting a cold" but patient was coughing more the night before.  EMS was called and patient was brought to the ED.  In the ED patient became unresponsive and was intubated by EDP.  PCCM was called to admit.  STUDIES:  11/13 CXR with aspiration PNA and abdominal x-ray ? dentures in her lower abdomen. 11/13 CT abd/pelvis, no foreign body noted ? Overlap of bowel walls, neg for obstruction   SIGNIFICANT EVENTS: 11/13 Presenting with AMS, DKA and VDRF.  SUBJECTIVE:  Agitated requiring F/V bolus  BS tr up on insulin drip, IV infiltrated  Pressor dependent with CVP 12    VITAL SIGNS: Temp:  [94.3 F (34.6 C)-102.3 F (39.1 C)] 98.9 F (37.2 C) (11/14 0740) Pulse Rate:  [35-138] 114 (11/14 0800) Resp:  [0-38] 18 (11/14 0800) BP: (49-114)/(17-83) 96/47 mmHg (11/14 0800) SpO2:  [90 %-100 %] 96 % (11/14 0800) Arterial Line BP: (61-107)/(40-59) 90/52 mmHg (11/14 0800) FiO2 (%):  [60 %-100 %] 60 % (11/14 0800) Weight:  [65.7 kg (144 lb 13.5 oz)] 65.7 kg (144 lb 13.5 oz) (11/13 1600) HEMODYNAMICS: CVP:  [10 mmHg-12 mmHg] 11 mmHg VENTILATOR SETTINGS: Vent Mode:  [-] PRVC FiO2 (%):  [60 %-100 %] 60 % Set Rate:  [12 bmp-35 bmp] 35 bmp Vt Set:  [500 mL] 500 mL PEEP:  [5 cmH20-10 cmH20] 10 cmH20 Plateau Pressure:  [20 cmH20-35 cmH20] 29 cmH20 INTAKE / OUTPUT:  Intake/Output Summary (Last 24 hours) at 10/02/14 0855 Last data filed at 10/02/14 0800  Gross per 24 hour  Intake 31917.9 ml  Output   1075 ml  Net 30842.9 ml    PHYSICAL  EXAMINATION: General:  Chronically ill appearing female who appears older than stated age, agonally breathing on the ventilator. Neuro:  Unresponsive, moving all ext spontaneously. HEENT:  [place]/AT, PERRL, EOM-spontaneous, DMM Cardiovascular:  Regular, tachy, Nl S1/S2, -M/R/G. Lungs:  Coarse BS diffusely, R>L rales. Abdomen:  Soft, NT, ND and +BS. Musculoskeletal:  -edema and -tenderness. Skin:  Thin but intact.  LABS:  CBC  Recent Labs Lab 10/01/14 1620 10/02/14 0200  WBC 6.9 7.6  HGB 13.6 11.7*  HCT 40.8 34.7*  PLT 252 152   Coag's No results for input(s): APTT, INR in the last 168 hours. BMET  Recent Labs Lab 10/02/14 10/02/14 0200 10/02/14 0600  NA 139 137 139  K 3.2* 2.6* 3.1*  CL 109 108 107  CO2 13* 14* 14*  BUN 30* 30* 30*  CREATININE 1.80* 1.85* 1.99*  GLUCOSE 403* 419* 404*   Electrolytes  Recent Labs Lab 10/01/14 1649  10/02/14 10/02/14 0200 10/02/14 0600  CALCIUM 7.4*  < > 7.9* 7.9* 7.7*  MG 1.6  --   --  1.8  --   PHOS 2.4  --   --  <0.5*  --   < > = values in this interval not displayed. Sepsis Markers  Recent Labs Lab 10/01/14 1007 10/01/14 1650  LATICACIDVEN 1.28 1.6   ABG  Recent Labs Lab 10/01/14 2149 10/02/14 0548 10/02/14  0753  PHART 7.175* 7.117* 7.114*  PCO2ART 31.7* 43.0 44.5  PO2ART 97.0 70.0* 70.0*   Liver Enzymes  Recent Labs Lab 10/01/14 0950  AST 37  ALT 14  ALKPHOS 121*  BILITOT 0.4  ALBUMIN 2.4*   Cardiac Enzymes  Recent Labs Lab 10/01/14 0950 10/01/14 1649 10/02/14 0001  TROPONINI  --  <0.30 <0.30  PROBNP 2023.0*  --   --    Glucose  Recent Labs Lab 10/02/14 0205 10/02/14 0308 10/02/14 0416 10/02/14 0520 10/02/14 0610 10/02/14 0715  GLUCAP 377* 325* 339* 328* 349* 336*    Imaging Ct Abdomen Pelvis Wo Contrast  10/01/2014   CLINICAL DATA:  Status post cardiac arrest. Unresponsive with possible foreign body in the alimentary tract.  EXAM: CT ABDOMEN AND PELVIS WITHOUT CONTRAST   TECHNIQUE: Multidetector CT imaging of the abdomen and pelvis was performed following the standard protocol without IV contrast.  COMPARISON:  Abdomen radiograph 10/01/2014, abdomen radiographs 04/15/2009 and CT abdomen pelvis 12/27/2008  FINDINGS: Lung bases: There is extensive collapse/consolidation of the dependent portion of both lower lobes. There are also patchy and nodular airspace opacities in the right and left lower lobes, the right middle lobe, and lingula. There is a small pericardial effusion. There are small bilateral pleural effusions. Visualized portion of the heart appears normal in size.  Abdomen/pelvis: A nasogastric tube terminates in the fundus of the stomach. The stomach is decompressed. There is a small amount of amorphous high density in the gastric lumen at the fundus.  The noncontrast appearance of the liver, gallbladder, spleen, adrenal glands, pancreas, and kidneys is within normal limits. Negative for urinary tract stone disease or obstruction.  There is a small volume of ascites seen in both upper quadrants, adjacent to the spleen and liver. Ascites also tracks in the left paracolic gutter and extends into the dependent portion of the pelvis. This fluid measures water density. There is also mesenteric fluid/stranding in the upper abdominal mesenteries and there is bilateral perinephric retroperitoneal stranding.  The abdominal aorta is normal in caliber and contains scattered atherosclerotic calcification.  Bowel loops are normal in caliber. Moderate amount stool throughout the colon. No discrete bowel wall thickening is appreciated. Foley catheter decompresses the urinary bladder.  Coarse calcifications in the left pelvis are stable compared to prior CT dated 12/27/2008 and consistent with a calcified uterine fibroid. No adnexal mass.  No radiopaque foreign body is identified within the abdomen or pelvis, and specifically no dentures are seen within the alimentary tract. The questioned  density overlying the sacrum on today's abdomen radiograph view is of uncertain etiology, but possibly due to overlap of bowel wall. Rectum is unremarkable.  Negative for free intraperitoneal air or lymphadenopathy.  No acute or suspicious bony abnormality.  IMPRESSION: 1. No radiopaque foreign body is identified. Specifically, no dentures are seen within the abdomen or pelvis. The questioned density overlying the sacrum on today's abdominal radiograph is of uncertain etiology but may have reflected overlap of bowel walls. Negative for bowel obstruction. 2. Extensive nodular airspace disease and consolidation at the lung bases bilaterally. This finding could be seen in the setting of aspiration or pneumonia. There are small bilateral pleural effusions. 3. Small volume of abdominal and pelvic ascites and fluid/stranding in the abdominal mesenteries and and in the perinephric spaces in the retroperitoneum. 4. Small pericardial effusion. 5. Nasogastric.  Foley catheter present.   Electronically Signed   By: Britta Mccreedy M.D.   On: 10/01/2014 16:01   Ct Head Wo  Contrast  10/01/2014   CLINICAL DATA:  Unresponsive, post cardiac arrest  EXAM: CT HEAD WITHOUT CONTRAST  TECHNIQUE: Contiguous axial images were obtained from the base of the skull through the vertex without intravenous contrast.  COMPARISON:  None  FINDINGS: Few scattered motion artifacts.  Generalized atrophy.  Normal ventricular morphology.  No midline shift or mass effect.  No gross intracranial hemorrhage, mass lesion or evidence acute infarction.  Scattered beam hardening artifacts.  Gray-white differentiation the frontal regions is not well delineated though this may be related to beam hardening artifacts.  No extra-axial fluid collections.  Scattered sinus air-fluid levels and opacification.  IMPRESSION: No definite acute intracranial abnormalities as above.   Electronically Signed   By: Ulyses SouthwardMark  Boles M.D.   On: 10/01/2014 15:37   Dg Chest Portable  1 View  10/01/2014   CLINICAL DATA:  Shock, check ETT/ central line placement  EXAM: PORTABLE CHEST - 1 VIEW  COMPARISON:  10/01/2014 at 0954 hr  FINDINGS: Endotracheal tube terminates 2 cm above the carina.  Mild patchy right upper lobe and bilateral lower lobe opacities, suspicious for multifocal pneumonia. Possible small right pleural effusion.  The heart is normal in size.  Right subclavian venous catheter terminates at cavoatrial junction.  Enteric tube courses below the diaphragm.  Defibrillator pads overlying the left hemithorax.  IMPRESSION: Mild patchy opacities in the right upper lobe and bilateral lower lobes, suspicious for multifocal pneumonia. Possible small right pleural effusion.  Endotracheal tube terminates 2 cm above the carina.  Right subclavian venous catheter terminates at the cavoatrial junction.   Electronically Signed   By: Charline BillsSriyesh  Krishnan M.D.   On: 10/01/2014 13:03   Dg Chest Portable 1 View  10/01/2014   CLINICAL DATA: Endotracheal tube placement  EXAM: PORTABLE CHEST - 1 VIEW  COMPARISON:  None.  FINDINGS: Endotracheal tube tip between the clavicular heads and carina (approximately 15 mm above the carina). A gastric suction tube is coiled in the gastric fundus.  Normal heart size and mediastinal contours.  There is patchy bilateral airspace disease, most dense in the right mid chest and left base. Trace right pleural effusion is likely. No pneumothorax. No pulmonary edema.  IMPRESSION: 1. Endotracheal and orogastric tubes are in unremarkable position. 2. Bilateral airspace disease favoring pneumonia or aspiration.   Electronically Signed   By: Tiburcio PeaJonathan  Watts M.D.   On: 10/01/2014 10:47   Dg Abd Portable 1v  10/01/2014   CLINICAL DATA:  Missing denture  EXAM: PORTABLE ABDOMEN - 1 VIEW  COMPARISON:  May 22, 2014  FINDINGS: Nasogastric tube tip and side port are in the stomach. Bowel gas pattern is unremarkable. There is moderate stool in the colon. 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  There is a curvilinear radiopaque foreign body overlying the sacrum which was not present on prior study. The etiology of this structure is uncertain. This radiopacity does not have the classic appearance of a denture.  IMPRESSION: There is a radiopaque structure overlying the sacrum, not seen on prior study. The etiology of this structure is uncertain. This appearance is not classic for a denture. Given the circumstances, it may warrant correlation with noncontrast CT to further assess.  Bowel gas pattern unremarkable. Nasogastric tube tip and side port in stomach. 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   Electronically Signed   By: Bretta BangWilliam  Woodruff M.D.   On: 10/01/2014 11:38     ASSESSMENT / PLAN:  PULMONARY OETT 11/13>>>  A: VDRF due to AMS from likely acidosis due to DKA. PNA  P:   - Intubate. - Full vent support./adjust setting  - F/U ABG at 14 and in am  - F/U CXR in am  - W/D ETT 2 cm    CARDIOVASCULAR CVL L [place] [facility] 11/13>>> A: Hypotension likely related to acidosis and sepsis. Neg troponins .  P:  - Decrease IVF KVO  - 2D echo pend  - Follow CVP. - titrate pressors for MAP >65  - Hold all anti-HTN. - Treat infection. -cont stress dose steroids   RENAL A:  Severe metabolic acidosis. Acute renal failure w/ rising scr and decreased UOP  Hypomagnesium  Hypophosatemia  Hypokalemia   P:   - Treat DKA. - Serial BMETs. - Replete electrolytes as indicated. -strict I/O  -cont HCO3 drip     GASTROINTESTINAL A:  ? Swallowed dentures>not seen on CT abd/pelvis , appreciate GI input  P: -  - NGT. -  Consider TF in am   HEMATOLOGIC A:  Anemia   P:  - Monitor CBC.  INFECTIOUS A:  Likely aspiration PNA. Sepsis  P:   BCx2 11/13>>> UC 11/13>>> Sputum 11/13>>> Abx: Cefepime, start date 11/13, day 1/x  ENDOCRINE A:  DM and DKA Cortisol high 11/13  P:   - DKA protocol. - CBGs.  NEUROLOGIC A:  AMS due to DKA  and acidosis. P:   RASS goal: 0 - PRN versed and fentanyl.   FAMILY  - Updates: no family at bedside   TODAY'S SUMMARY: 63 year old, DKA, asp PNA, DKA protocol , septic shock with worsening renal failure . Cont on pressor support to keep MAP >65. Try to correct DKA , Insulin drip infiltrated , restarted . Continue HCO3 drip . Recheck ABG ~1400. IV resusciatation >10 L . Back off on IVF , CVP 12.    Tammy Parrett NP-C  [facility]  [phone]   10/02/2014, 8:55 AM   Attending:  I have seen and examined the patient with nurse practitioner/resident and agree with the note above.   We are slightly better today than yesterday when I saw her, but she remains very ill on multiple pressors, ARDS.  Acidosis is improving  [facility] the [facility]icarb gtt [facility] Continue insulin infusion through new IV, events of night noted  We will go ahead and start vanc for the GPC in sputum and blood, but I really doubt MRSA bacteremia  My cc time is 40 minutes  Heber CarolinaBrent McQuaid, MD [facility] Pager: [phone] Cell: [phone] If no response, call [phone]

## 2014-10-02 NOTE — Progress Notes (Signed)
Progress Note for [facility]  Subjective: No acute events.  Objective: Vital signs in last 24 hours: Temp:  [94.3 F (34.6 C)-102.3 F (39.1 C)] 98.9 F (37.2 C) (11/14 0740) Pulse Rate:  [35-138] 114 (11/14 0700) Resp:  [0-38] 19 (11/14 0700) BP: (49-114)/(17-83) 97/47 mmHg (11/14 0700) SpO2:  [90 %-100 %] 96 % (11/14 0700) Arterial Line BP: (61-107)/(40-59) 105/59 mmHg (11/14 0700) FiO2 (%):  [60 %-100 %] 60 % (11/14 0700) Weight:  [65.7 kg (144 lb 13.5 oz)] 65.7 kg (144 lb 13.5 oz) (11/13 1600)    Intake/Output from previous day: 11/13 0701 - 11/14 0700 In: 10633.7 [I.V.:7908; IV Piggyback:2725.8] Out: 1065 [Urine:1065] Intake/Output this shift:    General appearance: Intubated and sedated GI: soft, non-tender; bowel sounds normal; no masses,  no organomegaly  Lab Results:  Recent Labs  10/01/14 1620 10/02/14 0200  WBC 6.9 7.6  HGB 13.6 11.7*  HCT 40.8 34.7*  PLT 252 152   BMET  Recent Labs  10/02/14 10/02/14 0200 10/02/14 0600  NA 139 137 139  K 3.2* 2.6* 3.1*  CL 109 108 107  CO2 13* 14* 14*  GLUCOSE 403* 419* 404*  BUN 30* 30* 30*  CREATININE 1.80* 1.85* 1.99*  CALCIUM 7.9* 7.9* 7.7*   LFT  Recent Labs  10/01/14 0950  PROT 7.1  ALBUMIN 2.4*  AST 37  ALT 14  ALKPHOS 121*  BILITOT 0.4   PT/INR No results for input(s): LABPROT, INR in the last 72 hours. Hepatitis Panel No results for input(s): HEPBSAG, HCVAB, HEPAIGM, HEPBIGM in the last 72 hours. C-Diff No results for input(s): CDIFFTOX in the last 72 hours. Fecal Lactopherrin No results for input(s): FECLLACTOFRN in the last 72 hours.  Studies/Results: Ct Abdomen Pelvis Wo Contrast  10/01/2014   CLINICAL DATA:  Status post cardiac arrest. Unresponsive with possible foreign body in the alimentary tract.  EXAM: CT ABDOMEN AND PELVIS WITHOUT CONTRAST  TECHNIQUE: Multidetector CT imaging of the abdomen and pelvis was performed following the standard protocol without IV contrast.   COMPARISON:  Abdomen radiograph 10/01/2014, abdomen radiographs 04/15/2009 and CT abdomen pelvis 12/27/2008  FINDINGS: Lung bases: There is extensive collapse/consolidation of the dependent portion of both lower lobes. There are also patchy and nodular airspace opacities in the right and left lower lobes, the right middle lobe, and lingula. There is a small pericardial effusion. There are small bilateral pleural effusions. Visualized portion of the heart appears normal in size.  Abdomen/pelvis: A nasogastric tube terminates in the fundus of the stomach. The stomach is decompressed. There is a small amount of amorphous high density in the gastric lumen at the fundus.  The noncontrast appearance of the liver, gallbladder, spleen, adrenal glands, pancreas, and kidneys is within normal limits. Negative for urinary tract stone disease or obstruction.  There is a small volume of ascites seen in both upper quadrants, adjacent to the spleen and liver. Ascites also tracks in the left paracolic gutter and extends into the dependent portion of the pelvis. This fluid measures water density. There is also mesenteric fluid/stranding in the upper abdominal mesenteries and there is bilateral perinephric retroperitoneal stranding.  The abdominal aorta is normal in caliber and contains scattered atherosclerotic calcification.  Bowel loops are normal in caliber. Moderate amount stool throughout the colon. No discrete bowel wall thickening is appreciated. Foley catheter decompresses the urinary bladder.  Coarse calcifications in the left pelvis are stable compared to prior CT dated 12/27/2008 and consistent with a calcified uterine fibroid. No  adnexal mass.  No radiopaque foreign body is identified within the abdomen or pelvis, and specifically no dentures are seen within the alimentary tract. The questioned density overlying the sacrum on today's abdomen radiograph view is of uncertain etiology, but possibly due to overlap of bowel  wall. Rectum is unremarkable.  Negative for free intraperitoneal air or lymphadenopathy.  No acute or suspicious bony abnormality.  IMPRESSION: 1. No radiopaque foreign body is identified. Specifically, no dentures are seen within the abdomen or pelvis. The questioned density overlying the sacrum on today's abdominal radiograph is of uncertain etiology but may have reflected overlap of bowel walls. Negative for bowel obstruction. 2. Extensive nodular airspace disease and consolidation at the lung bases bilaterally. This finding could be seen in the setting of aspiration or pneumonia. There are small bilateral pleural effusions. 3. Small volume of abdominal and pelvic ascites and fluid/stranding in the abdominal mesenteries and and in the perinephric spaces in the retroperitoneum. 4. Small pericardial effusion. 5. Nasogastric.  Foley catheter present.   Electronically Signed   By: Britta MccreedySusan  Turner M.D.   On: 10/01/2014 16:01   Ct Head Wo Contrast  10/01/2014   CLINICAL DATA:  Unresponsive, post cardiac arrest  EXAM: CT HEAD WITHOUT CONTRAST  TECHNIQUE: Contiguous axial images were obtained from the base of the skull through the vertex without intravenous contrast.  COMPARISON:  None  FINDINGS: Few scattered motion artifacts.  Generalized atrophy.  Normal ventricular morphology.  No midline shift or mass effect.  No gross intracranial hemorrhage, mass lesion or evidence acute infarction.  Scattered beam hardening artifacts.  Gray-white differentiation the frontal regions is not well delineated though this may be related to beam hardening artifacts.  No extra-axial fluid collections.  Scattered sinus air-fluid levels and opacification.  IMPRESSION: No definite acute intracranial abnormalities as above.   Electronically Signed   By: Ulyses SouthwardMark  Boles M.D.   On: 10/01/2014 15:37   Dg Chest Port 1 View  10/02/2014   CLINICAL DATA:  Endotracheal tube replacement.  EXAM: PORTABLE CHEST - 1 VIEW  COMPARISON:  One-view chest  10/01/2014  FINDINGS: The endotracheal tube is 9 mm from the carina and directed towards the right mainstem bronchus. In NG tube courses off the inferior border the film. A right subclavian line terminates at the cavoatrial junction. The defibrillator pad has been removed.  Mild pulmonary vascular congestion is evident. Right basilar airspace disease is improving. Mild atelectasis at the left base is similar to the prior exam.  IMPRESSION: 1. The tip of the endotracheal tube is less than 1 cm from the carina and directed towards the right mainstem bronchus. This could be called back scratch the this could be pulled back 3 cm or more for more optimal positioning. 2. A improving right basilar airspace disease. 3. The remainder the support apparatus is stable.  These results were called by telephone at the time of interpretation on 10/02/2014 at 7:34 am to [place], [place], who verbally acknowledged these results.   Electronically Signed   By: Gennette Pachris  Mattern M.D.   On: 10/02/2014 07:34   Dg Chest Portable 1 View  10/01/2014   CLINICAL DATA:  Shock, check ETT/ central line placement  EXAM: PORTABLE CHEST - 1 VIEW  COMPARISON:  10/01/2014 at 0954 hr  FINDINGS: Endotracheal tube terminates 2 cm above the carina.  Mild patchy right upper lobe and bilateral lower lobe opacities, suspicious for multifocal pneumonia. Possible small right pleural effusion.  The heart is normal in size.  Right subclavian venous catheter terminates at cavoatrial junction.  Enteric tube courses below the diaphragm.  Defibrillator pads overlying the left hemithorax.  IMPRESSION: Mild patchy opacities in the right upper lobe and bilateral lower lobes, suspicious for multifocal pneumonia. Possible small right pleural effusion.  Endotracheal tube terminates 2 cm above the carina.  Right subclavian venous catheter terminates at the cavoatrial junction.   Electronically Signed   By: Charline Bills M.D.   On: 10/01/2014 13:03   Dg Chest Portable 1  View  10/01/2014   CLINICAL DATA: Endotracheal tube placement  EXAM: PORTABLE CHEST - 1 VIEW  COMPARISON:  None.  FINDINGS: Endotracheal tube tip between the clavicular heads and carina (approximately 15 mm above the carina). A gastric suction tube is coiled in the gastric fundus.  Normal heart size and mediastinal contours.  There is patchy bilateral airspace disease, most dense in the right mid chest and left base. Trace right pleural effusion is likely. No pneumothorax. No pulmonary edema.  IMPRESSION: 1. Endotracheal and orogastric tubes are in unremarkable position. 2. Bilateral airspace disease favoring pneumonia or aspiration.   Electronically Signed   By: Tiburcio Pea M.D.   On: 10/01/2014 10:47   Dg Abd Portable 1v  10/01/2014   CLINICAL DATA:  Missing denture  EXAM: PORTABLE ABDOMEN - 1 VIEW  COMPARISON:  May 22, 2014  FINDINGS: Nasogastric tube tip and side port are in the stomach. Bowel gas pattern is unremarkable. There is moderate stool in the colon. 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  There is a curvilinear radiopaque foreign body overlying the sacrum which was not present on prior study. The etiology of this structure is uncertain. This radiopacity does not have the classic appearance of a denture.  IMPRESSION: There is a radiopaque structure overlying the sacrum, not seen on prior study. The etiology of this structure is uncertain. This appearance is not classic for a denture. Given the circumstances, it may warrant correlation with noncontrast CT to further assess.  Bowel gas pattern unremarkable. Nasogastric tube tip and side port in stomach. 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   Electronically Signed   By: Bretta Bang M.D.   On: 10/01/2014 11:38    Medications:  Scheduled: . antiseptic oral rinse  7 mL Mouth Rinse QID  . ceFEPime (MAXIPIME) IV  1 g Intravenous Q8H  . chlorhexidine  15 mL Mouth Rinse BID  . heparin  5,000 Units  Subcutaneous 3 times per day  . hydrocortisone sodium succinate  50 mg Intravenous Q6H  . pantoprazole (PROTONIX) IV  40 mg Intravenous Q24H  . potassium phosphate IVPB (mEq)  40 mEq Intravenous Once   Continuous: . sodium chloride 1,000 mL (10/01/14 1134)  . sodium chloride 100 mL/hr at 10/01/14 1742  . dextrose 5 % and 0.45% NaCl    . fentaNYL infusion INTRAVENOUS 50 mcg/hr (10/02/14 0540)  . insulin (NOVOLIN-R) infusion 40.5 Units/hr (10/02/14 0612)  . norepinephrine (LEVOPHED) Adult infusion 10 mcg/min (10/02/14 0645)  . phenylephrine (NEO-SYNEPHRINE) Adult infusion 160 mcg/min (10/02/14 0448)  .  sodium bicarbonate  infusion 1000 mL 150 mL/hr at 10/02/14 0612  . vasopressin (PITRESSIN) infusion - *FOR SHOCK* 0.03 Units/min (10/01/14 1720)    Assessment/Plan: 1) ? Foreign body ingestion. 2) Respiratory failure. 3) Hypotensive.   The CT scan of the abdomen is negative for any ingested dentures.  Currently she is stable from the GI standpoint.  Plan: 1) No further GI evaluation.  LOS: 1 day   Shaquil Aldana D 10/02/2014, 7:57 AM

## 2014-10-02 NOTE — Progress Notes (Signed)
Blood culture gram + cocci in cluster for aerobic bottle reported to T. Parrett, NP.

## 2014-10-02 NOTE — Progress Notes (Signed)
R. Hand mottled, with cyanosis present thumb, index finger, middle finger and palmar portion of hand posterior to phalanges.  Nail beds mottled. Elink notified. Night rounder provider to see pt.

## 2014-10-02 NOTE — Progress Notes (Signed)
eLink Physician-Brief Progress Note Patient Name: Abigail RutherfordDeborah A Eugene DOB: Sep 09, 1951 MRN: [id number]   Date of Service  10/02/2014  HPI/Events of Note  NS Bolus  Given for oliguria  eICU Interventions       Intervention Category Major Interventions: Hypotension - evaluation and management  Shan Levansatrick Jasper Ruminski 10/02/2014, 11:27 PM

## 2014-10-02 NOTE — Progress Notes (Signed)
CRITICAL VALUE ALERT  Critical value received:  k 2.6, Phos <0.5  Date of notification:  10/02/2014  Time of notification:  02:50  Critical value read back:Yes.    Nurse who received alert:  L Desta Bujak RN  MD notified (1st page):  03:00  Time of first page:  3:00  MD notified (2nd page):  Time of second page:  Responding MD:  Vassie LollAlva MD  Time MD responded:  03:00

## 2014-10-02 NOTE — Progress Notes (Signed)
[facility]  2D Echocardiogram completed.  Sole Lengacher L Arvin Abello, RDCS 10/02/2014 11:48 AM

## 2014-10-02 NOTE — Progress Notes (Signed)
eLink Physician-Brief Progress Note Patient Name: Abigail RutherfordDeborah A Zamora DOB: 10/14/1951 MRN: [id number]   Date of Service  10/02/2014  HPI/Events of Note    eICU Interventions  HypoK, hypo phos - repleted     Intervention Category Intermediate Interventions: Electrolyte abnormality - evaluation and management  Lilas Diefendorf V. 10/02/2014, 3:02 AM

## 2014-10-02 NOTE — Progress Notes (Signed)
Patient's right hand mottling and becoming cyanotic, pulse 1+ and faint, Dr. Belinda BlockGiddings came into evaluate, Arterial Line was removed at 2030 per Dr. Andree ElkGidding's order. Will continue to monitor and assess.

## 2014-10-02 NOTE — Progress Notes (Addendum)
ANTIBIOTIC CONSULT NOTE   Pharmacy Consult for Cefepime and Vancomycin Indication: Empiric asp pna coverage and GPC in blood/sputum  Allergies  Allergen Reactions  . Penicillins     Rash     Patient Measurements: Weight: 144 lb 13.5 oz (65.7 kg)   Vital Signs: Temp: 99.1 F (37.3 C) (11/14 1209) Temp Source: Oral (11/14 1209) BP: 92/60 mmHg (11/14 1200) Pulse Rate: 112 (11/14 1200) Intake/Output from previous day: 11/13 0701 - 11/14 0700 In: 10633.7 [I.V.:7908; IV Piggyback:2725.8] Out: 1065 [Urine:1065] Intake/Output from this shift: Total I/O In: 2264.8 [I.V.:2164.8; IV Piggyback:100] Out: 50 [Urine:50]  Labs:  Recent Labs  10/01/14 1620  10/02/14 0200 10/02/14 0600 10/02/14 0952  WBC 6.9  --  7.6  --   --   HGB 13.6  --  11.7*  --   --   PLT 252  --  152  --   --   CREATININE  --   < > 1.85* 1.99* 2.02*  < > = values in this interval not displayed. Estimated Creatinine Clearance: 26.3 mL/min (by C-G formula based on Cr of 2.02). No results for input(s): VANCOTROUGH, VANCOPEAK, VANCORANDOM, GENTTROUGH, GENTPEAK, GENTRANDOM, TOBRATROUGH, TOBRAPEAK, TOBRARND, AMIKACINPEAK, AMIKACINTROU, AMIKACIN in the last 72 hours.   Microbiology: Recent Results (from the past 720 hour(s))  Blood culture (routine x 2)     Status: None (Preliminary result)   Collection Time: 10/01/14  9:50 AM  Result Value Ref Range Status   Specimen Description BLOOD FOREARM LEFT  Final   Special Requests BOTTLES DRAWN AEROBIC AND ANAEROBIC 5CC  Final   Culture  Setup Time   Final    10/01/2014 14:50 Performed at [facility]    Culture   Final    GRAM POSITIVE COCCI IN CLUSTERS Note: Gram Stain Report Called to,Read Back By and Verified With: RITA BROWN 10/02/14 @ 11:34AM BY RUSCOE A. Performed at [facility]    Report Status PENDING  Incomplete  Culture, respiratory (NON-Expectorated)     Status: None (Preliminary result)   Collection Time: 10/01/14 12:50 PM   Result Value Ref Range Status   Specimen Description ENDOTRACHEAL  Final   Special Requests NONE  Final   Gram Stain   Final    MODERATE WBC PRESENT,BOTH PMN AND MONONUCLEAR RARE SQUAMOUS EPITHELIAL CELLS PRESENT ABUNDANT GRAM POSITIVE COCCI IN PAIRS IN CLUSTERS Performed at [facility]    Culture PENDING  Incomplete   Report Status PENDING  Incomplete  MRSA PCR Screening     Status: None   Collection Time: 10/01/14  4:04 PM  Result Value Ref Range Status   MRSA by PCR NEGATIVE NEGATIVE Final    Comment:        The GeneXpert MRSA Assay (FDA approved for NASAL specimens only), is one component of a comprehensive MRSA colonization surveillance program. It is not intended to diagnose MRSA infection nor to guide or monitor treatment for MRSA infections.     Assessment: [age] YOF brought into the [facility] with DKA requiring intubation by CCM to protect airway. Cefepime (Day #2) started for asp pna. Now pt with GPC in clusters in blood and sputum. To being Vancomycin (Day #1). Tc 99.1. WBC wnl.  SCr 2.02, estimate CrCl ~26 ml/min. UOP 1065 yesterday.  11/13 Cefepime>> 11/14 Vanc>>  11/13 Bld x2>>GPC 11/13 Endotrach>>gram stain GPC 11/14 Urine>> MRSA PCR neg  Goal of Therapy:  Vancomycin trough 15-20 mcg/ml  Plan:  1. Change Cefepime to 1g IV every 24 hours  2. Vancomycin 1gm IV q24h 3. Will f/u micro data, pt's clinical condition, renal function 4. Vanc trough at C[facility]  Christoper Fabianaron Shamus Desantis, PharmD, BCPS Clinical pharmacist, pager [phone] 10/02/2014 12:58 PM

## 2014-10-02 NOTE — Progress Notes (Signed)
eLink Physician-Brief Progress Note Patient Name: Abigail RutherfordDeborah A Zamora DOB: 23-Oct-1951 MRN: [id number]   Date of Service  10/02/2014  HPI/Events of Note   Hand with A-line blue.   eICU Interventions   Bedside MD to see once she arrives.      Intervention Category Intermediate Interventions: OtherCurt Zamora:  [name] R. 10/02/2014, 6:45 PM

## 2014-10-02 NOTE — Progress Notes (Signed)
eLink Physician-Brief Progress Note Patient Name: Abigail RutherfordDeborah A Zamora DOB: 23-Nov-1950 MRN: [id number]   Date of Service  10/02/2014  HPI/Events of Note  Called for high peak/plat pressures RR 35- autoPEEP high on vent camera chk  eICU Interventions  Lowered RR to 25 - rpt ABG in 1hr     Intervention Category Major Interventions: Respiratory failure - evaluation and management Intermediate Interventions: Other:  Naba Sneed V. 10/02/2014, 6:41 AM

## 2014-10-03 ENCOUNTER — Inpatient Hospital Stay (HOSPITAL_COMMUNITY): Payer: Medicare Other

## 2014-10-03 DIAGNOSIS — B9561 Methicillin susceptible Staphylococcus aureus infection as the cause of diseases classified elsewhere: Secondary | ICD-10-CM

## 2014-10-03 DIAGNOSIS — I998 Other disorder of circulatory system: Secondary | ICD-10-CM

## 2014-10-03 DIAGNOSIS — E1311 Other specified diabetes mellitus with ketoacidosis with coma: Secondary | ICD-10-CM

## 2014-10-03 DIAGNOSIS — R7881 Bacteremia: Secondary | ICD-10-CM

## 2014-10-03 DIAGNOSIS — B9562 Methicillin resistant Staphylococcus aureus infection as the cause of diseases classified elsewhere: Secondary | ICD-10-CM

## 2014-10-03 DIAGNOSIS — E872 Acidosis: Secondary | ICD-10-CM

## 2014-10-03 LAB — BASIC METABOLIC PANEL
ANION GAP: 14 (ref 5–15)
BUN: 36 mg/dL — ABNORMAL HIGH (ref 6–23)
CALCIUM: 8.2 mg/dL — AB (ref 8.4–10.5)
CO2: 17 mEq/L — ABNORMAL LOW (ref 19–32)
Chloride: 102 mEq/L (ref 96–112)
Creatinine, Ser: 2.72 mg/dL — ABNORMAL HIGH (ref 0.50–1.10)
GFR calc non Af Amer: 18 mL/min — ABNORMAL LOW (ref >90)
GFR, EST AFRICAN AMERICAN: 21 mL/min — AB (ref >90)
Glucose, Bld: 121 mg/dL — ABNORMAL HIGH (ref 70–99)
Potassium: 4.1 mEq/L (ref 3.7–5.3)
SODIUM: 133 meq/L — AB (ref 137–147)

## 2014-10-03 LAB — URINE CULTURE
COLONY COUNT: NO GROWTH
Culture: NO GROWTH

## 2014-10-03 LAB — URINALYSIS, ROUTINE W REFLEX MICROSCOPIC
Glucose, UA: NEGATIVE mg/dL
Ketones, ur: 15 mg/dL — AB
NITRITE: NEGATIVE
Specific Gravity, Urine: 1.026 (ref 1.005–1.030)
UROBILINOGEN UA: 0.2 mg/dL (ref 0.0–1.0)
pH: 5 (ref 5.0–8.0)

## 2014-10-03 LAB — POCT I-STAT 3, ART BLOOD GAS (G3+)
ACID-BASE DEFICIT: 12 mmol/L — AB (ref 0.0–2.0)
Acid-base deficit: 12 mmol/L — ABNORMAL HIGH (ref 0.0–2.0)
Acid-base deficit: 12 mmol/L — ABNORMAL HIGH (ref 0.0–2.0)
BICARBONATE: 16.6 meq/L — AB (ref 20.0–24.0)
BICARBONATE: 15 meq/L — AB (ref 20.0–24.0)
Bicarbonate: 16.8 mEq/L — ABNORMAL LOW (ref 20.0–24.0)
O2 SAT: 97 %
O2 SAT: 92 %
O2 SAT: 89 %
PCO2 ART: 47.5 mmHg — AB (ref 35.0–45.0)
PH ART: 7.159 — AB (ref 7.350–7.450)
PO2 ART: 80 mmHg (ref 80.0–100.0)
Patient temperature: 98.2
TCO2: 18 mmol/L (ref 0–100)
TCO2: 18 mmol/L (ref 0–100)
TCO2: 16 mmol/L (ref 0–100)
pCO2 arterial: 45.4 mmHg — ABNORMAL HIGH (ref 35.0–45.0)
pCO2 arterial: 36.8 mmHg (ref 35.0–45.0)
pH, Arterial: 7.171 — CL (ref 7.350–7.450)
pH, Arterial: 7.216 — ABNORMAL LOW (ref 7.350–7.450)
pO2, Arterial: 115 mmHg — ABNORMAL HIGH (ref 80.0–100.0)
pO2, Arterial: 68 mmHg — ABNORMAL LOW (ref 80.0–100.0)

## 2014-10-03 LAB — GLUCOSE, CAPILLARY
GLUCOSE-CAPILLARY: 149 mg/dL — AB (ref 70–99)
GLUCOSE-CAPILLARY: 138 mg/dL — AB (ref 70–99)
GLUCOSE-CAPILLARY: 96 mg/dL (ref 70–99)
GLUCOSE-CAPILLARY: 90 mg/dL (ref 70–99)
GLUCOSE-CAPILLARY: 95 mg/dL (ref 70–99)
GLUCOSE-CAPILLARY: 97 mg/dL (ref 70–99)
GLUCOSE-CAPILLARY: 95 mg/dL (ref 70–99)
GLUCOSE-CAPILLARY: 182 mg/dL — AB (ref 70–99)
GLUCOSE-CAPILLARY: 278 mg/dL — AB (ref 70–99)
GLUCOSE-CAPILLARY: 293 mg/dL — AB (ref 70–99)
Glucose-Capillary: 100 mg/dL — ABNORMAL HIGH (ref 70–99)
Glucose-Capillary: 100 mg/dL — ABNORMAL HIGH (ref 70–99)
Glucose-Capillary: 102 mg/dL — ABNORMAL HIGH (ref 70–99)
Glucose-Capillary: 117 mg/dL — ABNORMAL HIGH (ref 70–99)
Glucose-Capillary: 119 mg/dL — ABNORMAL HIGH (ref 70–99)
Glucose-Capillary: 158 mg/dL — ABNORMAL HIGH (ref 70–99)
Glucose-Capillary: 267 mg/dL — ABNORMAL HIGH (ref 70–99)
Glucose-Capillary: 322 mg/dL — ABNORMAL HIGH (ref 70–99)
Glucose-Capillary: 344 mg/dL — ABNORMAL HIGH (ref 70–99)

## 2014-10-03 LAB — CREATININE, URINE, RANDOM: Creatinine, Urine: 151.33 mg/dL

## 2014-10-03 LAB — SODIUM, URINE, RANDOM: SODIUM UR: 53 meq/L

## 2014-10-03 LAB — URINE MICROSCOPIC-ADD ON

## 2014-10-03 LAB — PHOSPHORUS: Phosphorus: 1.9 mg/dL — ABNORMAL LOW (ref 2.3–4.6)

## 2014-10-03 LAB — CBC
HCT: 35.2 % — ABNORMAL LOW (ref 36.0–46.0)
HEMOGLOBIN: 11.8 g/dL — AB (ref 12.0–15.0)
MCH: 26.4 pg (ref 26.0–34.0)
MCHC: 33.5 g/dL (ref 30.0–36.0)
MCV: 78.7 fL (ref 78.0–100.0)
Platelets: 83 10*3/uL — ABNORMAL LOW (ref 150–400)
RBC: 4.47 MIL/uL (ref 3.87–5.11)
RDW: 16.8 % — ABNORMAL HIGH (ref 11.5–15.5)
WBC: 25.6 10*3/uL — ABNORMAL HIGH (ref 4.0–10.5)

## 2014-10-03 LAB — MAGNESIUM: Magnesium: 1.8 mg/dL (ref 1.5–2.5)

## 2014-10-03 LAB — CK: CK TOTAL: 225 U/L — AB (ref 7–177)

## 2014-10-03 LAB — LACTIC ACID, PLASMA: Lactic Acid, Venous: 1.9 mmol/L (ref 0.5–2.2)

## 2014-10-03 MED ORDER — SODIUM CHLORIDE 0.9 % IV SOLN
INTRAVENOUS | Status: DC
  Administered 2014-10-03: 2.6 [IU]/h via INTRAVENOUS
  Filled 2014-10-03: qty 2.5

## 2014-10-03 MED ORDER — SODIUM BICARBONATE 8.4 % IV SOLN
INTRAVENOUS | Status: DC
  Administered 2014-10-03: 18:00:00 via INTRAVENOUS
  Filled 2014-10-03 (×2): qty 150

## 2014-10-03 MED ORDER — FUROSEMIDE 10 MG/ML IJ SOLN
160.0000 mg | Freq: Once | INTRAVENOUS | Status: AC
  Administered 2014-10-03: 160 mg via INTRAVENOUS
  Filled 2014-10-03: qty 16

## 2014-10-03 MED ORDER — POTASSIUM PHOSPHATES 15 MMOLE/5ML IV SOLN
20.0000 meq | Freq: Once | INTRAVENOUS | Status: AC
  Administered 2014-10-03: 20 meq via INTRAVENOUS
  Filled 2014-10-03: qty 4.55

## 2014-10-03 MED ORDER — ALBUTEROL SULFATE (2.5 MG/3ML) 0.083% IN NEBU: 2.5000 mg | INHALATION_SOLUTION | RESPIRATORY_TRACT | Status: DC | PRN

## 2014-10-03 MED ORDER — INSULIN ASPART 100 UNIT/ML ~~LOC~~ SOLN
1.0000 [IU] | SUBCUTANEOUS | Status: DC
  Administered 2014-10-03: 2 [IU] via SUBCUTANEOUS

## 2014-10-03 MED ORDER — IPRATROPIUM-ALBUTEROL 0.5-2.5 (3) MG/3ML IN SOLN
3.0000 mL | Freq: Four times a day (QID) | RESPIRATORY_TRACT | Status: DC
  Administered 2014-10-03 – 2014-10-13 (×39): 3 mL via RESPIRATORY_TRACT
  Filled 2014-10-03 (×40): qty 3

## 2014-10-03 NOTE — Consult Note (Signed)
Reason for Consult:AKI Referring Physician: Dr. Ila Mcgill is an 63 y.o. female.  HPI: 63 yr female with hx of DM , HTN, schizophrenia, incontinence, admitted 11/13 with AMS, entub, hypotensive.   Proceded to have low Cardiac arrest.  Had severe AGMA on admit, glu 595.   No serum ketones done, but had ketones in urine.  No salicylates in blood but EtGlycol and MeOH not done.  Has had schock requiring pressors in high doses.  Also ischemic hand and ? Asp pneumonia.  On Metformen and Lisinopril at home (lactate reasonable).   Treated with AB, IVF, vent, and insulin.  Since admit very little urine 5-10 cc/h.   Cr on admit 1.28 and has risen progressively to 2.7.  Acidemia has improved but still significant.  Hx incontinence and ??1000cc in bladder on admit. Review of systems not obtained due to patient factors.   Past Medical History  Diagnosis Date  . Mixed stress and urge urinary incontinence   . Schizophrenia   . Allergy     No past surgical history on file.  Family History  Problem Relation Age of Onset  . Heart disease Mother   . Emphysema Paternal Grandfather   . Hypertension Father   . Stroke Father   . Heart disease Father     Social History:  reports that she has quit smoking. Her smoking use included Cigarettes. She smoked 3.00 packs per day. She does not have any smokeless tobacco history on file. She reports that she drinks about 3.6 oz of alcohol per week. Her drug history is not on file.  Allergies:  Allergies  Allergen Reactions  . Penicillins     Rash     Medications:  I have reviewed the patient's current medications. Prior to Admission:  Prescriptions prior to admission  Medication Sig Dispense Refill Last Dose  . benztropine (COGENTIN) 1 MG tablet Take 1 mg by mouth 2 (two) times daily.  2   . buPROPion (WELLBUTRIN XL) 150 MG 24 hr tablet Take 150 mg by mouth daily.   05/22/2014 at Unknown time  . fluticasone (FLONASE) 50 MCG/ACT nasal spray  Place 1 spray into both nostrils daily.   Past Week at Unknown time  . glyBURIDE (DIABETA) 2.5 MG tablet Take 2.5 mg by mouth daily with breakfast.     . insulin glargine (LANTUS) 100 UNIT/ML injection Inject 20 Units into the skin daily.     Marland Kitchen lisinopril (PRINIVIL,ZESTRIL) 5 MG tablet Take 1 tablet (5 mg total) by mouth daily. 30 tablet 0   . Multiple Vitamins-Minerals (MULTIVITAMIN PO) Take 1 tablet by mouth daily.   Past Week at Unknown time  . oxybutynin (DITROPAN-XL) 10 MG 24 hr tablet Take 10 mg by mouth daily.  2   . sitaGLIPtin-metformin (JANUMET) 50-1000 MG per tablet Take 1 tablet by mouth 2 (two) times daily with a meal.     . glucose monitoring kit (FREESTYLE) monitoring kit 1 each by Does not apply route 4 (four) times daily - after meals and at bedtime. 1 month Diabetic Testing Supplies for QAC-QHS accuchecks.Any brand OK 1 each 1   . insulin NPH-regular Human (NOVOLIN 70/30) (70-30) 100 UNIT/ML injection Inject 20 Units into the skin 2 (two) times daily with a meal. 10 mL 11   . metFORMIN (GLUCOPHAGE) 500 MG tablet Take 1 tablet (500 mg total) by mouth 2 (two) times daily with a meal. (Patient not taking: Reported on 10/01/2014) 60 tablet 0  Results for orders placed or performed during the hospital encounter of 10/01/14 (from the past 48 hour(s))  Glucose, capillary     Status: Abnormal   Collection Time: 10/01/14  4:45 PM  Result Value Ref Range   Glucose-Capillary 428 (H) 70 - 99 mg/dL   Comment 1 Notify RN    Comment 2 Glucose Stabilizer   Basic metabolic panel (timed)     Status: Abnormal   Collection Time: 10/01/14  4:49 PM  Result Value Ref Range   Sodium 140 137 - 147 mEq/L   Potassium 3.5 (L) 3.7 - 5.3 mEq/L   Chloride 108 96 - 112 mEq/L   CO2 11 (L) 19 - 32 mEq/L   Glucose, Bld 474 (H) 70 - 99 mg/dL   BUN 27 (H) 6 - 23 mg/dL   Creatinine, Ser 1.34 (H) 0.50 - 1.10 mg/dL   Calcium 7.4 (L) 8.4 - 10.5 mg/dL   GFR calc non Af Amer 42 (L) >90 mL/min   GFR calc  Af Amer 48 (L) >90 mL/min    Comment: (NOTE) The eGFR has been calculated using the CKD EPI equation. This calculation has not been validated in all clinical situations. eGFR's persistently <90 mL/min signify possible Chronic Kidney Disease.    Anion gap 21 (H) 5 - 15  Cortisol     Status: None   Collection Time: 10/01/14  4:49 PM  Result Value Ref Range   Cortisol, Plasma 61.6 ug/dL    Comment: (NOTE) AM:  4.3 - 22.4 ug/dL PM:  3.1 - 16.7 ug/dL Performed at [facility]   Troponin I     Status: None   Collection Time: 10/01/14  4:49 PM  Result Value Ref Range   Troponin I <0.30 <0.30 ng/mL    Comment:        Due to the release kinetics of cTnI, a negative result within the first hours of the onset of symptoms does not rule out myocardial infarction with certainty. If myocardial infarction is still suspected, repeat the test at appropriate intervals.   Magnesium     Status: None   Collection Time: 10/01/14  4:49 PM  Result Value Ref Range   Magnesium 1.6 1.5 - 2.5 mg/dL  Phosphorus     Status: None   Collection Time: 10/01/14  4:49 PM  Result Value Ref Range   Phosphorus 2.4 2.3 - 4.6 mg/dL  Lactic acid, plasma     Status: None   Collection Time: 10/01/14  4:50 PM  Result Value Ref Range   Lactic Acid, Venous 1.6 0.5 - 2.2 mmol/L  Glucose, capillary     Status: Abnormal   Collection Time: 10/01/14  5:39 PM  Result Value Ref Range   Glucose-Capillary 408 (H) 70 - 99 mg/dL  Glucose, capillary     Status: Abnormal   Collection Time: 10/01/14  6:54 PM  Result Value Ref Range   Glucose-Capillary 450 (H) 70 - 99 mg/dL  Basic metabolic panel     Status: Abnormal   Collection Time: 10/01/14  8:00 PM  Result Value Ref Range   Sodium 132 (L) 137 - 147 mEq/L   Potassium 2.8 (LL) 3.7 - 5.3 mEq/L    Comment: CRITICAL RESULT CALLED TO, READ BACK BY AND VERIFIED WITH: L VARNER,RN 2112 10/01/14 WBOND    Chloride 105 96 - 112 mEq/L   CO2 9 (LL) 19 - 32 mEq/L     Comment: CRITICAL RESULT CALLED TO, READ BACK BY AND VERIFIED WITH:  L VARNER,RN 2112 10/01/14 WBOND    Glucose, Bld 465 (H) 70 - 99 mg/dL   BUN 28 (H) 6 - 23 mg/dL   Creatinine, Ser 1.52 (H) 0.50 - 1.10 mg/dL   Calcium 7.7 (L) 8.4 - 10.5 mg/dL   GFR calc non Af Amer 36 (L) >90 mL/min   GFR calc Af Amer 42 (L) >90 mL/min    Comment: (NOTE) The eGFR has been calculated using the CKD EPI equation. This calculation has not been validated in all clinical situations. eGFR's persistently <90 mL/min signify possible Chronic Kidney Disease.    Anion gap 18 (H) 5 - 15  Glucose, capillary     Status: Abnormal   Collection Time: 10/01/14  8:04 PM  Result Value Ref Range   Glucose-Capillary 380 (H) 70 - 99 mg/dL  Glucose, capillary     Status: Abnormal   Collection Time: 10/01/14  9:08 PM  Result Value Ref Range   Glucose-Capillary 398 (H) 70 - 99 mg/dL  I-STAT 3, arterial blood gas (G3+)     Status: Abnormal   Collection Time: 10/01/14  9:49 PM  Result Value Ref Range   pH, Arterial 7.175 (LL) 7.350 - 7.450   pCO2 arterial 31.7 (L) 35.0 - 45.0 mmHg   pO2, Arterial 97.0 80.0 - 100.0 mmHg   Bicarbonate 11.6 (L) 20.0 - 24.0 mEq/L   TCO2 12 0 - 100 mmol/L   O2 Saturation 95.0 %   Acid-base deficit 15.0 (H) 0.0 - 2.0 mmol/L   Patient temperature 100.0 F    Collection site RADIAL, ALLEN'S TEST ACCEPTABLE    Drawn by Operator    Sample type ARTERIAL    Comment NOTIFIED PHYSICIAN   Glucose, capillary     Status: Abnormal   Collection Time: 10/01/14 10:08 PM  Result Value Ref Range   Glucose-Capillary 404 (H) 70 - 99 mg/dL  Basic metabolic panel     Status: Abnormal   Collection Time: 10/01/14 10:17 PM  Result Value Ref Range   Sodium 139 137 - 147 mEq/L    Comment: DELTA CHECK NOTED   Potassium 2.6 (LL) 3.7 - 5.3 mEq/L    Comment: CRITICAL RESULT CALLED TO, READ BACK BY AND VERIFIED WITH: VARNER L,RN 10/01/14 2254 WAYK    Chloride 109 96 - 112 mEq/L   CO2 13 (L) 19 - 32 mEq/L    Glucose, Bld 454 (H) 70 - 99 mg/dL   BUN 29 (H) 6 - 23 mg/dL   Creatinine, Ser 1.70 (H) 0.50 - 1.10 mg/dL   Calcium 7.8 (L) 8.4 - 10.5 mg/dL   GFR calc non Af Amer 31 (L) >90 mL/min   GFR calc Af Amer 36 (L) >90 mL/min    Comment: (NOTE) The eGFR has been calculated using the CKD EPI equation. This calculation has not been validated in all clinical situations. eGFR's persistently <90 mL/min signify possible Chronic Kidney Disease.    Anion gap 17 (H) 5 - 15  Glucose, capillary     Status: Abnormal   Collection Time: 10/01/14 11:11 PM  Result Value Ref Range   Glucose-Capillary 397 (H) 70 - 99 mg/dL  Basic metabolic panel     Status: Abnormal   Collection Time: 10/02/14 12:00 AM  Result Value Ref Range   Sodium 139 137 - 147 mEq/L   Potassium 3.2 (L) 3.7 - 5.3 mEq/L    Comment: DELTA CHECK NOTED   Chloride 109 96 - 112 mEq/L   CO2 13 (L) 19 -  32 mEq/L   Glucose, Bld 403 (H) 70 - 99 mg/dL   BUN 30 (H) 6 - 23 mg/dL   Creatinine, Ser 1.80 (H) 0.50 - 1.10 mg/dL   Calcium 7.9 (L) 8.4 - 10.5 mg/dL   GFR calc non Af Amer 29 (L) >90 mL/min   GFR calc Af Amer 34 (L) >90 mL/min    Comment: (NOTE) The eGFR has been calculated using the CKD EPI equation. This calculation has not been validated in all clinical situations. eGFR's persistently <90 mL/min signify possible Chronic Kidney Disease.    Anion gap 17 (H) 5 - 15  Troponin I     Status: None   Collection Time: 10/02/14 12:01 AM  Result Value Ref Range   Troponin I <0.30 <0.30 ng/mL    Comment:        Due to the release kinetics of cTnI, a negative result within the first hours of the onset of symptoms does not rule out myocardial infarction with certainty. If myocardial infarction is still suspected, repeat the test at appropriate intervals.   Glucose, capillary     Status: Abnormal   Collection Time: 10/02/14 12:06 AM  Result Value Ref Range   Glucose-Capillary 418 (H) 70 - 99 mg/dL  Glucose, capillary     Status:  Abnormal   Collection Time: 10/02/14  1:02 AM  Result Value Ref Range   Glucose-Capillary 386 (H) 70 - 99 mg/dL  CBC     Status: Abnormal   Collection Time: 10/02/14  2:00 AM  Result Value Ref Range   WBC 7.6 4.0 - 10.5 K/uL   RBC 4.45 3.87 - 5.11 MIL/uL   Hemoglobin 11.7 (L) 12.0 - 15.0 g/dL   HCT 34.7 (L) 36.0 - 46.0 %   MCV 78.0 78.0 - 100.0 fL   MCH 26.3 26.0 - 34.0 pg   MCHC 33.7 30.0 - 36.0 g/dL   RDW 15.8 (H) 11.5 - 15.5 %   Platelets 152 150 - 400 K/uL    Comment: REPEATED TO VERIFY  Basic metabolic panel     Status: Abnormal   Collection Time: 10/02/14  2:00 AM  Result Value Ref Range   Sodium 137 137 - 147 mEq/L   Potassium 2.6 (LL) 3.7 - 5.3 mEq/L    Comment: CRITICAL RESULT CALLED TO, READ BACK BY AND VERIFIED WITH: VARNER L,RN 10/02/14 0239 WAYK    Chloride 108 96 - 112 mEq/L   CO2 14 (L) 19 - 32 mEq/L   Glucose, Bld 419 (H) 70 - 99 mg/dL   BUN 30 (H) 6 - 23 mg/dL   Creatinine, Ser 1.85 (H) 0.50 - 1.10 mg/dL   Calcium 7.9 (L) 8.4 - 10.5 mg/dL   GFR calc non Af Amer 28 (L) >90 mL/min   GFR calc Af Amer 33 (L) >90 mL/min    Comment: (NOTE) The eGFR has been calculated using the CKD EPI equation. This calculation has not been validated in all clinical situations. eGFR's persistently <90 mL/min signify possible Chronic Kidney Disease.    Anion gap 15 5 - 15  Magnesium     Status: None   Collection Time: 10/02/14  2:00 AM  Result Value Ref Range   Magnesium 1.8 1.5 - 2.5 mg/dL  Phosphorus     Status: Abnormal   Collection Time: 10/02/14  2:00 AM  Result Value Ref Range   Phosphorus <0.5 (LL) 2.3 - 4.6 mg/dL    Comment: CRITICAL RESULT CALLED TO, READ BACK BY AND VERIFIED  WITH: Crissie Figures 10/02/14 0240 WAYK   Glucose, capillary     Status: Abnormal   Collection Time: 10/02/14  2:05 AM  Result Value Ref Range   Glucose-Capillary 377 (H) 70 - 99 mg/dL  Glucose, capillary     Status: Abnormal   Collection Time: 10/02/14  3:08 AM  Result Value Ref Range    Glucose-Capillary 325 (H) 70 - 99 mg/dL  Urine culture     Status: None   Collection Time: 10/02/14  3:15 AM  Result Value Ref Range   Specimen Description URINE, RANDOM    Special Requests NONE    Culture  Setup Time      10/02/2014 10:55 Performed at [facility] Performed at [facility]     Culture NO GROWTH Performed at [facility]     Report Status 10/03/2014 FINAL   Urinalysis, Routine w reflex microscopic     Status: Abnormal   Collection Time: 10/02/14  3:15 AM  Result Value Ref Range   Color, Urine AMBER (A) YELLOW    Comment: BIOCHEMICALS MAY BE AFFECTED BY COLOR   APPearance CLOUDY (A) CLEAR   Specific Gravity, Urine 1.025 1.005 - 1.030   pH 5.0 5.0 - 8.0   Glucose, UA >1000 (A) NEGATIVE mg/dL   Hgb urine dipstick SMALL (A) NEGATIVE   Bilirubin Urine LARGE (A) NEGATIVE   Ketones, ur 40 (A) NEGATIVE mg/dL   Protein, ur 100 (A) NEGATIVE mg/dL   Urobilinogen, UA 1.0 0.0 - 1.0 mg/dL   Nitrite NEGATIVE NEGATIVE   Leukocytes, UA NEGATIVE NEGATIVE  Urine microscopic-add on     Status: Abnormal   Collection Time: 10/02/14  3:15 AM  Result Value Ref Range   Squamous Epithelial / LPF RARE RARE   WBC, UA 0-2 <3 WBC/hpf   RBC / HPF 0-2 <3 RBC/hpf   Bacteria, UA MANY (A) RARE   Urine-Other AMORPHOUS URATES/PHOSPHATES   Glucose, capillary     Status: Abnormal   Collection Time: 10/02/14  4:16 AM  Result Value Ref Range   Glucose-Capillary 339 (H) 70 - 99 mg/dL  Glucose, capillary     Status: Abnormal   Collection Time: 10/02/14  5:20 AM  Result Value Ref Range   Glucose-Capillary 328 (H) 70 - 99 mg/dL  I-STAT 3, arterial blood gas (G3+)     Status: Abnormal   Collection Time: 10/02/14  5:48 AM  Result Value Ref Range   pH, Arterial 7.117 (LL) 7.350 - 7.450   pCO2 arterial 43.0 35.0 - 45.0 mmHg   pO2, Arterial 70.0 (L) 80.0 - 100.0 mmHg   Bicarbonate 13.8 (L) 20.0 - 24.0 mEq/L   TCO2 15 0 - 100 mmol/L   O2  Saturation 86.0 %   Acid-base deficit 15.0 (H) 0.0 - 2.0 mmol/L   Patient temperature 99.3 F    Collection site RADIAL, ALLEN'S TEST ACCEPTABLE    Drawn by Operator    Sample type ARTERIAL    Comment NOTIFIED PHYSICIAN   Basic metabolic panel     Status: Abnormal   Collection Time: 10/02/14  6:00 AM  Result Value Ref Range   Sodium 139 137 - 147 mEq/L   Potassium 3.1 (L) 3.7 - 5.3 mEq/L   Chloride 107 96 - 112 mEq/L   CO2 14 (L) 19 - 32 mEq/L   Glucose, Bld 404 (H) 70 - 99 mg/dL   BUN 30 (H) 6 - 23 mg/dL  Creatinine, Ser 1.99 (H) 0.50 - 1.10 mg/dL   Calcium 7.7 (L) 8.4 - 10.5 mg/dL   GFR calc non Af Amer 26 (L) >90 mL/min   GFR calc Af Amer 30 (L) >90 mL/min    Comment: (NOTE) The eGFR has been calculated using the CKD EPI equation. This calculation has not been validated in all clinical situations. eGFR's persistently <90 mL/min signify possible Chronic Kidney Disease.    Anion gap 18 (H) 5 - 15  Glucose, capillary     Status: Abnormal   Collection Time: 10/02/14  6:10 AM  Result Value Ref Range   Glucose-Capillary 349 (H) 70 - 99 mg/dL  Glucose, capillary     Status: Abnormal   Collection Time: 10/02/14  7:15 AM  Result Value Ref Range   Glucose-Capillary 336 (H) 70 - 99 mg/dL  I-STAT 3, arterial blood gas (G3+)     Status: Abnormal   Collection Time: 10/02/14  7:53 AM  Result Value Ref Range   pH, Arterial 7.114 (LL) 7.350 - 7.450   pCO2 arterial 44.5 35.0 - 45.0 mmHg   pO2, Arterial 70.0 (L) 80.0 - 100.0 mmHg   Bicarbonate 14.3 (L) 20.0 - 24.0 mEq/L   TCO2 16 0 - 100 mmol/L   O2 Saturation 86.0 %   Acid-base deficit 15.0 (H) 0.0 - 2.0 mmol/L   Patient temperature 98.9 F    Collection site ARTERIAL LINE    Drawn by Operator    Sample type ARTERIAL    Comment NOTIFIED PHYSICIAN   Glucose, capillary     Status: Abnormal   Collection Time: 10/02/14  8:34 AM  Result Value Ref Range   Glucose-Capillary 341 (H) 70 - 99 mg/dL  Glucose, capillary     Status: Abnormal    Collection Time: 10/02/14  9:17 AM  Result Value Ref Range   Glucose-Capillary 371 (H) 70 - 99 mg/dL  Basic metabolic panel     Status: Abnormal   Collection Time: 10/02/14  9:52 AM  Result Value Ref Range   Sodium 135 (L) 137 - 147 mEq/L   Potassium 2.9 (LL) 3.7 - 5.3 mEq/L    Comment: CRITICAL RESULT CALLED TO, READ BACK BY AND VERIFIED WITH: BROWN R.,RN 11.14.15 1055 BY JONESJ    Chloride 103 96 - 112 mEq/L   CO2 14 (L) 19 - 32 mEq/L   Glucose, Bld 400 (H) 70 - 99 mg/dL   BUN 32 (H) 6 - 23 mg/dL   Creatinine, Ser 2.02 (H) 0.50 - 1.10 mg/dL   Calcium 8.0 (L) 8.4 - 10.5 mg/dL   GFR calc non Af Amer 25 (L) >90 mL/min   GFR calc Af Amer 29 (L) >90 mL/min    Comment: (NOTE) The eGFR has been calculated using the CKD EPI equation. This calculation has not been validated in all clinical situations. eGFR's persistently <90 mL/min signify possible Chronic Kidney Disease.    Anion gap 18 (H) 5 - 15  Glucose, capillary     Status: Abnormal   Collection Time: 10/02/14  9:57 AM  Result Value Ref Range   Glucose-Capillary 378 (H) 70 - 99 mg/dL  Glucose, capillary     Status: Abnormal   Collection Time: 10/02/14 11:00 AM  Result Value Ref Range   Glucose-Capillary 377 (H) 70 - 99 mg/dL  Glucose, capillary     Status: Abnormal   Collection Time: 10/02/14 12:03 PM  Result Value Ref Range   Glucose-Capillary 381 (H) 70 - 99 mg/dL  Glucose, capillary     Status: Abnormal   Collection Time: 10/02/14  1:06 PM  Result Value Ref Range   Glucose-Capillary 336 (H) 70 - 99 mg/dL  Glucose, capillary     Status: Abnormal   Collection Time: 10/02/14  2:16 PM  Result Value Ref Range   Glucose-Capillary 329 (H) 70 - 99 mg/dL  Basic metabolic panel     Status: Abnormal   Collection Time: 10/02/14  2:30 PM  Result Value Ref Range   Sodium 132 (L) 137 - 147 mEq/L   Potassium 3.5 (L) 3.7 - 5.3 mEq/L    Comment: DELTA CHECK NOTED   Chloride 100 96 - 112 mEq/L   CO2 17 (L) 19 - 32 mEq/L    Glucose, Bld 357 (H) 70 - 99 mg/dL   BUN 33 (H) 6 - 23 mg/dL   Creatinine, Ser 2.24 (H) 0.50 - 1.10 mg/dL   Calcium 8.0 (L) 8.4 - 10.5 mg/dL   GFR calc non Af Amer 22 (L) >90 mL/min   GFR calc Af Amer 26 (L) >90 mL/min    Comment: (NOTE) The eGFR has been calculated using the CKD EPI equation. This calculation has not been validated in all clinical situations. eGFR's persistently <90 mL/min signify possible Chronic Kidney Disease.    Anion gap 15 5 - 15  I-STAT 3, arterial blood gas (G3+)     Status: Abnormal   Collection Time: 10/02/14  2:54 PM  Result Value Ref Range   pH, Arterial 7.121 (LL) 7.350 - 7.450   pCO2 arterial 54.2 (H) 35.0 - 45.0 mmHg   pO2, Arterial 81.0 80.0 - 100.0 mmHg   Bicarbonate 17.6 (L) 20.0 - 24.0 mEq/L   TCO2 19 0 - 100 mmol/L   O2 Saturation 91.0 %   Acid-base deficit 12.0 (H) 0.0 - 2.0 mmol/L   Patient temperature 99.1 F    Collection site ARTERIAL LINE    Drawn by Operator    Sample type ARTERIAL    Comment NOTIFIED PHYSICIAN   Glucose, capillary     Status: Abnormal   Collection Time: 10/02/14  2:59 PM  Result Value Ref Range   Glucose-Capillary 307 (H) 70 - 99 mg/dL  Glucose, capillary     Status: Abnormal   Collection Time: 10/02/14  4:10 PM  Result Value Ref Range   Glucose-Capillary 275 (H) 70 - 99 mg/dL  Glucose, capillary     Status: Abnormal   Collection Time: 10/02/14  4:56 PM  Result Value Ref Range   Glucose-Capillary 267 (H) 70 - 99 mg/dL  Glucose, capillary     Status: Abnormal   Collection Time: 10/02/14  5:54 PM  Result Value Ref Range   Glucose-Capillary 244 (H) 70 - 99 mg/dL  Basic metabolic panel     Status: Abnormal   Collection Time: 10/02/14  6:00 PM  Result Value Ref Range   Sodium 137 137 - 147 mEq/L   Potassium 3.8 3.7 - 5.3 mEq/L   Chloride 104 96 - 112 mEq/L   CO2 16 (L) 19 - 32 mEq/L   Glucose, Bld 251 (H) 70 - 99 mg/dL   BUN 35 (H) 6 - 23 mg/dL   Creatinine, Ser 2.33 (H) 0.50 - 1.10 mg/dL   Calcium 8.2 (L)  8.4 - 10.5 mg/dL   GFR calc non Af Amer 21 (L) >90 mL/min   GFR calc Af Amer 25 (L) >90 mL/min    Comment: (NOTE) The eGFR has been calculated using the  CKD EPI equation. This calculation has not been validated in all clinical situations. eGFR's persistently <90 mL/min signify possible Chronic Kidney Disease.    Anion gap 17 (H) 5 - 15  Glucose, capillary     Status: Abnormal   Collection Time: 10/02/14  6:45 PM  Result Value Ref Range   Glucose-Capillary 224 (H) 70 - 99 mg/dL  Glucose, capillary     Status: Abnormal   Collection Time: 10/02/14  7:59 PM  Result Value Ref Range   Glucose-Capillary 224 (H) 70 - 99 mg/dL  Glucose, capillary     Status: Abnormal   Collection Time: 10/02/14  9:04 PM  Result Value Ref Range   Glucose-Capillary 179 (H) 70 - 99 mg/dL  Basic metabolic panel     Status: Abnormal   Collection Time: 10/02/14 10:05 PM  Result Value Ref Range   Sodium 133 (L) 137 - 147 mEq/L   Potassium 3.9 3.7 - 5.3 mEq/L   Chloride 102 96 - 112 mEq/L   CO2 18 (L) 19 - 32 mEq/L   Glucose, Bld 208 (H) 70 - 99 mg/dL   BUN 35 (H) 6 - 23 mg/dL   Creatinine, Ser 2.60 (H) 0.50 - 1.10 mg/dL   Calcium 8.3 (L) 8.4 - 10.5 mg/dL   GFR calc non Af Amer 19 (L) >90 mL/min   GFR calc Af Amer 22 (L) >90 mL/min    Comment: (NOTE) The eGFR has been calculated using the CKD EPI equation. This calculation has not been validated in all clinical situations. eGFR's persistently <90 mL/min signify possible Chronic Kidney Disease.    Anion gap 13 5 - 15  Glucose, capillary     Status: Abnormal   Collection Time: 10/02/14 10:08 PM  Result Value Ref Range   Glucose-Capillary 207 (H) 70 - 99 mg/dL  Glucose, capillary     Status: Abnormal   Collection Time: 10/02/14 11:11 PM  Result Value Ref Range   Glucose-Capillary 185 (H) 70 - 99 mg/dL   Comment 1 Notify RN   Glucose, capillary     Status: Abnormal   Collection Time: 10/03/14 12:20 AM  Result Value Ref Range   Glucose-Capillary 158  (H) 70 - 99 mg/dL  Glucose, capillary     Status: Abnormal   Collection Time: 10/03/14  1:20 AM  Result Value Ref Range   Glucose-Capillary 149 (H) 70 - 99 mg/dL  Glucose, capillary     Status: Abnormal   Collection Time: 10/03/14  2:13 AM  Result Value Ref Range   Glucose-Capillary 138 (H) 70 - 99 mg/dL  Glucose, capillary     Status: Abnormal   Collection Time: 10/03/14  3:12 AM  Result Value Ref Range   Glucose-Capillary 119 (H) 70 - 99 mg/dL  Basic metabolic panel     Status: Abnormal   Collection Time: 10/03/14  4:12 AM  Result Value Ref Range   Sodium 133 (L) 137 - 147 mEq/L   Potassium 4.1 3.7 - 5.3 mEq/L   Chloride 102 96 - 112 mEq/L   CO2 17 (L) 19 - 32 mEq/L   Glucose, Bld 121 (H) 70 - 99 mg/dL   BUN 36 (H) 6 - 23 mg/dL   Creatinine, Ser 2.72 (H) 0.50 - 1.10 mg/dL   Calcium 8.2 (L) 8.4 - 10.5 mg/dL   GFR calc non Af Amer 18 (L) >90 mL/min   GFR calc Af Amer 21 (L) >90 mL/min    Comment: (NOTE) The eGFR has been calculated  using the CKD EPI equation. This calculation has not been validated in all clinical situations. eGFR's persistently <90 mL/min signify possible Chronic Kidney Disease.    Anion gap 14 5 - 15  Magnesium     Status: None   Collection Time: 10/03/14  4:12 AM  Result Value Ref Range   Magnesium 1.8 1.5 - 2.5 mg/dL  Phosphorus     Status: Abnormal   Collection Time: 10/03/14  4:12 AM  Result Value Ref Range   Phosphorus 1.9 (L) 2.3 - 4.6 mg/dL  Glucose, capillary     Status: Abnormal   Collection Time: 10/03/14  4:20 AM  Result Value Ref Range   Glucose-Capillary 117 (H) 70 - 99 mg/dL  Lactic acid, plasma     Status: None   Collection Time: 10/03/14  4:21 AM  Result Value Ref Range   Lactic Acid, Venous 1.9 0.5 - 2.2 mmol/L  I-STAT 3, arterial blood gas (G3+)     Status: Abnormal   Collection Time: 10/03/14  4:33 AM  Result Value Ref Range   pH, Arterial 7.159 (LL) 7.350 - 7.450   pCO2 arterial 47.5 (H) 35.0 - 45.0 mmHg   pO2, Arterial  115.0 (H) 80.0 - 100.0 mmHg   Bicarbonate 16.8 (L) 20.0 - 24.0 mEq/L   TCO2 18 0 - 100 mmol/L   O2 Saturation 97.0 %   Acid-base deficit 12.0 (H) 0.0 - 2.0 mmol/L   Patient temperature 99.2 F    Collection site RADIAL, ALLEN'S TEST ACCEPTABLE    Drawn by RT    Sample type ARTERIAL    Comment NOTIFIED PHYSICIAN   Glucose, capillary     Status: None   Collection Time: 10/03/14  5:14 AM  Result Value Ref Range   Glucose-Capillary 96 70 - 99 mg/dL  Glucose, capillary     Status: Abnormal   Collection Time: 10/03/14  6:15 AM  Result Value Ref Range   Glucose-Capillary 102 (H) 70 - 99 mg/dL  CBC     Status: Abnormal   Collection Time: 10/03/14  7:03 AM  Result Value Ref Range   WBC 25.6 (H) 4.0 - 10.5 K/uL   RBC 4.47 3.87 - 5.11 MIL/uL   Hemoglobin 11.8 (L) 12.0 - 15.0 g/dL   HCT 35.2 (L) 36.0 - 46.0 %   MCV 78.7 78.0 - 100.0 fL   MCH 26.4 26.0 - 34.0 pg   MCHC 33.5 30.0 - 36.0 g/dL   RDW 16.8 (H) 11.5 - 15.5 %   Platelets 83 (L) 150 - 400 K/uL    Comment: REPEATED TO VERIFY PLATELET COUNT CONFIRMED BY SMEAR   Glucose, capillary     Status: Abnormal   Collection Time: 10/03/14  7:09 AM  Result Value Ref Range   Glucose-Capillary 100 (H) 70 - 99 mg/dL  Glucose, capillary     Status: Abnormal   Collection Time: 10/03/14  8:25 AM  Result Value Ref Range   Glucose-Capillary 100 (H) 70 - 99 mg/dL  Glucose, capillary     Status: None   Collection Time: 10/03/14  9:32 AM  Result Value Ref Range   Glucose-Capillary 90 70 - 99 mg/dL  Glucose, capillary     Status: None   Collection Time: 10/03/14 10:33 AM  Result Value Ref Range   Glucose-Capillary 95 70 - 99 mg/dL  Glucose, capillary     Status: None   Collection Time: 10/03/14 11:43 AM  Result Value Ref Range   Glucose-Capillary 97 70 - 99  mg/dL  I-STAT 3, arterial blood gas (G3+)     Status: Abnormal   Collection Time: 10/03/14 12:05 PM  Result Value Ref Range   pH, Arterial 7.171 (LL) 7.350 - 7.450   pCO2 arterial 45.4  (H) 35.0 - 45.0 mmHg   pO2, Arterial 80.0 80.0 - 100.0 mmHg   Bicarbonate 16.6 (L) 20.0 - 24.0 mEq/L   TCO2 18 0 - 100 mmol/L   O2 Saturation 92.0 %   Acid-base deficit 12.0 (H) 0.0 - 2.0 mmol/L   Patient temperature 98.9 F    Collection site RADIAL, ALLEN'S TEST ACCEPTABLE    Drawn by Operator    Sample type ARTERIAL    Comment NOTIFIED PHYSICIAN   Glucose, capillary     Status: None   Collection Time: 10/03/14 12:48 PM  Result Value Ref Range   Glucose-Capillary 95 70 - 99 mg/dL  Glucose, capillary     Status: Abnormal   Collection Time: 10/03/14  3:11 PM  Result Value Ref Range   Glucose-Capillary 182 (H) 70 - 99 mg/dL    Dg Chest Port 1 View  10/02/2014   CLINICAL DATA:  Endotracheal tube replacement.  EXAM: PORTABLE CHEST - 1 VIEW  COMPARISON:  One-view chest 10/01/2014  FINDINGS: The endotracheal tube is 9 mm from the carina and directed towards the right mainstem bronchus. In NG tube courses off the inferior border the film. A right subclavian line terminates at the cavoatrial junction. The defibrillator pad has been removed.  Mild pulmonary vascular congestion is evident. Right basilar airspace disease is improving. Mild atelectasis at the left base is similar to the prior exam.  IMPRESSION: 1. The tip of the endotracheal tube is less than 1 cm from the carina and directed towards the right mainstem bronchus. This could be called back scratch the this could be pulled back 3 cm or more for more optimal positioning. 2. A improving right basilar airspace disease. 3. The remainder the support apparatus is stable.  These results were called by telephone at the time of interpretation on 10/02/2014 at 7:34 am to [place], [place] ICU, who verbally acknowledged these results.   Electronically Signed   By: Lawrence Santiago M.D.   On: 10/02/2014 07:34    ROS Blood pressure 101/71, pulse 90, temperature 98.5 F (36.9 C), temperature source Oral, resp. rate 17, height '5\' 4"'  (1.626 m), weight 81.9 kg  (180 lb 8.9 oz), SpO2 98 %. Physical Exam Physical Examination: General appearance - pale not cooperative, moves extrem Mental status - uncooperative, entub, not coop Eyes - stare but can move lat both ways Mouth - mucous membranes moist, pharynx normal without lesions Neck - adenopathy noted PCL Lymphatics - posterior cervical nodes Chest - rhonchi bilat,R>L Heart - S1 and S2 normal, systolic murmur YS0/6 at apex Abdomen - soft, nontender, pos bs.but decreased Neurological - moves all extrem, sym facies, pos gag,appear to have EOMs, pupils react Extremities - R had cold mildly bluish but not severe, 3+ edema Skin - normal coloration and turgor, no rashes, no suspicious skin lesions noted  Assessment/Plan: 1 AKI oliguric with severe AGMA.  May be DKA but worrisome for other causes.  Contributions of Lisinopril and Metformen likely.  Sepsis appears primary but ??ingestion.  Late in the course to intervene.  Needs bicarb, and limit vol acutely but will need RRT if not better with in 24 h. 2 Sepis on AB, pressors 3 Hypertension: would not use ACEI or ARB in this patien 4. Low platelets ?  DIC vs He 5. VDRF MS, schock and asp 6 Pneu 7 DM ? DKA,  P Limit fluids, give bicarb, Urine Na, Cr, UA, U/S     Salvatore Shear L 10/03/2014, 4:26 PM

## 2014-10-03 NOTE — Progress Notes (Signed)
CRITICAL VALUE ALERT  Critical value received:  PH 7.159  Date of notification:  11/15  Time of notification:  0433  Critical value read back:Yes.    Nurse who received alert:  Hazel Samshristian Loris Seelye RN   MD notified (1st page):  Thana FarrP. Bradshaw RN in [email or web address]   Time of first page:  [phone]  MD notified (2nd page):  Time of second page:  Responding MD:  Pola CornElink   Time MD responded:  [phone]

## 2014-10-03 NOTE — Progress Notes (Signed)
Pharmacist Heart Failure Core Measure Documentation  Assessment: [name] has an EF documented as 35-40% on 10/02/14 by ECHO.  Rationale: Heart failure patients with left ventricular systolic dysfunction (LVSD) and an EF < 40% should be prescribed an angiotensin converting enzyme inhibitor (ACEI) or angiotensin receptor blocker (ARB) at discharge unless a contraindication is documented in the medical record.  This patient is not currently on an ACEI or ARB for HF.  This note is being placed in the record in order to provide documentation that a contraindication to the use of these agents is present for this encounter.  ACE Inhibitor or Angiotensin Receptor Blocker is contraindicated (specify all that apply)  []   ACEI allergy AND ARB allergy []   Angioedema []   Moderate or severe aortic stenosis []   Hyperkalemia [x]   Hypotension []   Renal artery stenosis [x]   Worsening renal function, preexisting renal disease or dysfunction   Lavonia Danamend, Demorris Choyce George 10/03/2014 10:19 AM

## 2014-10-03 NOTE — Consult Note (Addendum)
[facility] [place] HISTORY AND PHYSICAL   History of Present Illness:  Patient is a 63 y.o. year old female who presents for evaluation of right hand ischemia after right radial aline and right arm IV infiltration.  Aline was removed approximately 19 hours ago when mottling of hand was noted.  Apparently no worse but may be slightly better today.  Pt on high dose Neo/Levophed/Vasopressin for septic shock. She is sedated and on a ventilator.  Other medical problems include DM, schizophrenia.  Past Medical History  Diagnosis Date  . Mixed stress and urge urinary incontinence   . Schizophrenia   . Allergy     No past surgical history on file.  Social History History  Substance Use Topics  . Smoking status: Former Smoker -- 3.00 packs/day    Types: Cigarettes  . Smokeless tobacco: Not on file  . Alcohol Use: 3.6 oz/week    6 Cans of beer per week    Family History Family History  Problem Relation Age of Onset  . Heart disease Mother   . Emphysema Paternal Grandfather   . Hypertension Father   . Stroke Father   . Heart disease Father     Allergies  Allergies  Allergen Reactions  . Penicillins     Rash      Current Facility-Administered Medications  Medication Dose Route Frequency Provider Last Rate Last Dose  . 0.45 % sodium chloride infusion   Intravenous Continuous Julio Sicksammy S Parrett, NP   Stopped at 10/02/14 1812  . 0.9 %  sodium chloride infusion  1,000 mL Intravenous Continuous Ward GivensIva L Knapp, MD   Stopped at 10/02/14 0047  . 0.9 %  sodium chloride infusion   Intravenous Continuous Tammy S Parrett, NP 10 mL/hr at 10/03/14 0600    . acetaminophen (TYLENOL) solution 650 mg  650 mg Per Tube Q4H PRN Oretha Milchakesh Alva V, MD   650 mg at 10/02/14 0022  . albuterol (PROVENTIL) (2.5 MG/3ML) 0.083% nebulizer solution 2.5 mg  2.5 mg Nebulization Q2H PRN Tammy S Parrett, NP      . antiseptic oral rinse (CPC / CETYLPYRIDINIUM CHLORIDE 0.05%) solution 7 mL  7 mL Mouth  Rinse QID Alyson ReedyWesam G Yacoub, MD   7 mL at 10/03/14 1133  . ceFEPIme (MAXIPIME) 1 g in dextrose 5 % 50 mL IVPB  1 g Intravenous Q24H Hilario Quarryaron George Amend, RPH   1 g at 10/03/14 [id number]  . chlorhexidine (PERIDEX) 0.12 % solution 15 mL  15 mL Mouth Rinse BID Alyson ReedyWesam G Yacoub, MD   15 mL at 10/03/14 0758  . dextrose 5 %-0.45 % sodium chloride infusion   Intravenous Continuous Alyson ReedyWesam G Yacoub, MD 100 mL/hr at 10/03/14 0600    . dextrose 50 % solution 25 mL  25 mL Intravenous PRN Alyson ReedyWesam G Yacoub, MD      . fentaNYL (SUBLIMAZE) 2,500 mcg in sodium chloride 0.9 % 250 mL (10 mcg/mL) infusion  0-400 mcg/hr Intravenous Continuous Cyril Mourningakesh Alva V, MD 5 mL/hr at 10/03/14 0800 50 mcg/hr at 10/03/14 0800  . fentaNYL (SUBLIMAZE) bolus via infusion 50-100 mcg  50-100 mcg Intravenous Q1H PRN Cyril Mourningakesh Alva V, MD   50 mcg at 10/03/14 0845  . heparin injection 5,000 Units  5,000 Units Subcutaneous 3 times per day Alyson ReedyWesam G Yacoub, MD   5,000 Units at 10/03/14 0535  . hydrocortisone sodium succinate (SOLU-CORTEF) 100 MG injection 50 mg  50 mg Intravenous Q6H Alyson ReedyWesam G Yacoub, MD   50 mg at  10/03/14 1145  . insulin aspart (novoLOG) injection 1-3 Units  1-3 Units Subcutaneous 6 times per day Julio Sicksammy S Parrett, NP   0 Units at 10/03/14 1249  . ipratropium-albuterol (DUONEB) 0.5-2.5 (3) MG/3ML nebulizer solution 3 mL  3 mL Nebulization Q6H Tammy S Parrett, NP      . midazolam (VERSED) injection 1 mg  1 mg Intravenous Q2H PRN Tammy S Parrett, NP   1 mg at 10/02/14 1036  . norepinephrine (LEVOPHED) 16 mg in dextrose 5 % 250 mL (0.064 mg/mL) infusion  2-50 mcg/min Intravenous Titrated Dennie Fettersheresa Donovan Egan, RPH 9.4 mL/hr at 10/03/14 1256 10 mcg/min at 10/03/14 1256  . pantoprazole (PROTONIX) injection 40 mg  40 mg Intravenous Q24H Coralyn HellingVineet Sood, MD   40 mg at 10/02/14 1721  . phenylephrine (NEO-SYNEPHRINE) 40 mg in dextrose 5 % 250 mL (0.16 mg/mL) infusion  30-200 mcg/min Intravenous Titrated Alyson ReedyWesam G Yacoub, MD 54.4 mL/hr at 10/03/14 1256 145 mcg/min at  10/03/14 1256  . vancomycin (VANCOCIN) IVPB 1000 mg/200 mL premix  1,000 mg Intravenous Q24H Hilario Quarryaron George Amend, RPH   1,000 mg at 10/03/14 1246  . vasopressin (PITRESSIN) 40 Units in sodium chloride 0.9 % 250 mL (0.16 Units/mL) infusion  0.03 Units/min Intravenous Continuous Lupita Leashouglas B McQuaid, MD 11.3 mL/hr at 10/03/14 0600 0.03 Units/min at 10/03/14 0600    ROS:   Unable to obtain due to ventilator  Physical Examination  Filed Vitals:   10/03/14 1100 10/03/14 1119 10/03/14 1200 10/03/14 1300  BP: 106/38 96/41 95/34  94/50  Pulse: 89  88 88  Temp:      TempSrc:      Resp: 16  16 24   Height:      Weight:      SpO2: 100%  98% 98%    Body mass index is 30.98 kg/(m^2).  General:  Sedated on vent Extremity Pulses:  Edematous all extremities, right hand mottling digit tips, right radial aspect of hand first and second digit, ulnar doppler signal, left hand pink warm + radial ulnar doppler signal  Musculoskeletal:  Edema as mentioned  Neurologic: no withdrawal to pain  ASSESSMENT:  Ischemia right hand most likely multifactorial aline and pressors   PLAN:  Protect right extremity, no further blood draws IVs alines, wean pressors as tolerated.  Not a candidate for operation in her current state.  Will follow.  Fabienne Brunsharles Aleshka Corney, MD [facility] [place] Office: [phone] Pager: [phone]

## 2014-10-03 NOTE — Progress Notes (Signed)
Right upper extremity arterial duplex completed.  Right radial artery appears occluded distally.

## 2014-10-03 NOTE — Progress Notes (Addendum)
PULMONARY / CRITICAL CARE MEDICINE   Name: [name] MRN: [id number] DOB: [birth year]    ADMISSION DATE:  10/01/2014 CONSULTATION DATE:  10/01/2014  REFERRING MD :  EDP  CHIEF COMPLAINT:  DKA, respiratory failure and AMS  INITIAL PRESENTATION: 63 year old female with PMH of diabetes and medical non-compliance who was last seen normal the day PTP at 8 PM.  Husband woke up this AM and was unable to arouse patient.  Evidently both have been "fighting a cold" but patient was coughing more the night before.  EMS was called and patient was brought to the ED.  In the ED patient became unresponsive and was intubated by EDP.  PCCM was called to admit.  STUDIES:  11/13 CXR with aspiration PNA and abdominal x-ray ? dentures in her lower abdomen. 11/13 CT abd/pelvis, no foreign body noted ? Overlap of bowel walls, neg for obstruction  11/14 Echo >EF 35-40%, small pericardial effusion  11/15 Arterial doppler RUE >  SIGNIFICANT EVENTS: 11/13 Presenting with AMS, DKA and VDRF.  SUBJECTIVE:  Decreased UOP with worsening scr  Not following commands, agitation on WUA  BS tr down on insulin  Remains on max pressors  Right hand mottling and coolness     VITAL SIGNS: Temp:  [98.9 F (37.2 C)-100.1 F (37.8 C)] 98.9 F (37.2 C) (11/15 0740) Pulse Rate:  [50-112] 94 (11/15 1000) Resp:  [8-25] 8 (11/15 1000) BP: (61-130)/(21-91) 96/41 mmHg (11/15 1119) SpO2:  [97 %-100 %] 98 % (11/15 1000) Arterial Line BP: (77-112)/(47-63) 77/47 mmHg (11/14 1900) FiO2 (%):  [40 %-60 %] 40 % (11/15 1119) Weight:  [81.9 kg (180 lb 8.9 oz)] 81.9 kg (180 lb 8.9 oz) (11/15 0600) HEMODYNAMICS: CVP:  [10 mmHg-13 mmHg] 13 mmHg VENTILATOR SETTINGS: Vent Mode:  [-] PRVC FiO2 (%):  [40 %-60 %] 40 % Set Rate:  [18 bmp-25 bmp] 18 bmp Vt Set:  [370 mL-440 mL] 440 mL PEEP:  [10 cmH20] 10 cmH20 Plateau Pressure:  [22 cmH20-31 cmH20] 22 cmH20 INTAKE / OUTPUT:  Intake/Output Summary (Last 24 hours) at 10/03/14  1128 Last data filed at 10/03/14 1000  Gross per 24 hour  Intake 5939.59 ml  Output     83 ml  Net 5856.59 ml    PHYSICAL EXAMINATION: General:  Chronically ill appearing female who appears older than stated age, Neuro:  Unresponsive, moving all ext spontaneously. HEENT:  [place]/AT, ETT  Cardiovascular:  Regular, tachy, Nl S1/S2, -M/R/G. Lungs:  Coarse BS diffusely, R>L rales. Abdomen:  Soft, NT, ND and +BS. Musculoskeletal:  1+gen edema and  RUE >hand mottled , cool to touch , doppler pulse per RN  Skin:  Thin but intact.  LABS:  CBC  Recent Labs Lab 10/01/14 1620 10/02/14 0200 10/03/14 0703  WBC 6.9 7.6 25.6*  HGB 13.6 11.7* 11.8*  HCT 40.8 34.7* 35.2*  PLT 252 152 83*   Coag's No results for input(s): APTT, INR in the last 168 hours. BMET  Recent Labs Lab 10/02/14 1800 10/02/14 2205 10/03/14 0412  NA 137 133* 133*  K 3.8 3.9 4.1  CL 104 102 102  CO2 16* 18* 17*  BUN 35* 35* 36*  CREATININE 2.33* 2.60* 2.72*  GLUCOSE 251* 208* 121*   Electrolytes  Recent Labs Lab 10/01/14 1649  10/02/14 0200  10/02/14 1800 10/02/14 2205 10/03/14 0412  CALCIUM 7.4*  < > 7.9*  < > 8.2* 8.3* 8.2*  MG 1.6  --  1.8  --   --   --  1.8  PHOS 2.4  --  <0.5*  --   --   --  1.9*  < > = values in this interval not displayed. Sepsis Markers  Recent Labs Lab 10/01/14 1007 10/01/14 1650 10/03/14 0421  LATICACIDVEN 1.28 1.6 1.9   ABG  Recent Labs Lab 10/02/14 0753 10/02/14 1454 10/03/14 0433  PHART 7.114* 7.121* 7.159*  PCO2ART 44.5 54.2* 47.5*  PO2ART 70.0* 81.0 115.0*   Liver Enzymes  Recent Labs Lab 10/01/14 0950  AST 37  ALT 14  ALKPHOS 121*  BILITOT 0.4  ALBUMIN 2.4*   Cardiac Enzymes  Recent Labs Lab 10/01/14 0950 10/01/14 1649 10/02/14 0001  TROPONINI  --  <0.30 <0.30  PROBNP 2023.0*  --   --    Glucose  Recent Labs Lab 10/03/14 0213 10/03/14 0312 10/03/14 0420 10/03/14 0514 10/03/14 0615 10/03/14 0709  GLUCAP 138* 119* 117* 96  102* 100*    Imaging Dg Chest Port 1 View  10/02/2014   CLINICAL DATA:  Endotracheal tube replacement.  EXAM: PORTABLE CHEST - 1 VIEW  COMPARISON:  One-view chest 10/01/2014  FINDINGS: The endotracheal tube is 9 mm from the carina and directed towards the right mainstem bronchus. In NG tube courses off the inferior border the film. A right subclavian line terminates at the cavoatrial junction. The defibrillator pad has been removed.  Mild pulmonary vascular congestion is evident. Right basilar airspace disease is improving. Mild atelectasis at the left base is similar to the prior exam.  IMPRESSION: 1. The tip of the endotracheal tube is less than 1 cm from the carina and directed towards the right mainstem bronchus. This could be called back scratch the this could be pulled back 3 cm or more for more optimal positioning. 2. A improving right basilar airspace disease. 3. The remainder the support apparatus is stable.  These results were called by telephone at the time of interpretation on 10/02/2014 at 7:34 am [place] who verbally acknowledged these results.   Electronically Signed   By: Gennette Pachris  Mattern M.D.   On: 10/02/2014 07:34     ASSESSMENT / PLAN:  PULMONARY OETT 11/13>>> A: VDRF due to AMS from likely acidosis due to DKA. PNA  P:   - Full vent support./adjust setting  - F/U ABG in am  - F/U CXR in am  -Add BD     CARDIOVASCULAR CVL L  TLC 11/13>>> A: Hypotension likely related to acidosis and sepsis. Neg troponins .  CHF >echo 11/14 w/ decreased EF  11/15>max pressors unable to wean, CVP 13  RUE/Hand with mottling  P:  - Follow CVP. - titrate pressors for MAP >65  - Treat infection. -cont stress dose steroids  -Vascular consult  -check Arterial Doppler of R Arm   RENAL A:  Severe metabolic acidosis. Acute renal failure w/ rising scr and decreased UOP  Hypomagnesium  Hypophosatemia  Hypokalemia   P:   - Treat DKA. - Serial BMETs. - Replete electrolytes  as indicated. -strict I/O    GASTROINTESTINAL A:  ? Swallowed dentures>not seen on CT abd/pelvis , appreciate GI input  P: -  - NGT. -  Begin  TF   HEMATOLOGIC A:  Anemia   P:  - Monitor CBC.  INFECTIOUS A:  Likely aspiration PNA. Sepsis  P:   BCx2 11/13>>>1/2 staph aureus > UC 11/13>>> Sputum 11/13>>>GPC > Abx: Cefepime, start date 11/13, day 2/x Vanc 11/14 > day 2/x   ENDOCRINE A:  DM and DKA>BS tr  down on Insulin drip  Cortisol high 11/13  P:   - Transition to ICU SSI  - CBGs.  NEUROLOGIC A:  AMS due to DKA and acidosis. P:   RASS goal: 0 - PRN versed and fentanyl.   FAMILY  - Updates: sister, long time significant other updated at bedside at length by me on 11/15   TODAY'S SUMMARY: 63 year old, DKA, asp PNA, DKA protocol , septic shock with worsening renal failure . Cont on pressor support to keep MAP >65. RUE /Hand mottled ? Vascular comprimise>consult  Vacsular and check arterial doppler .   Tammy Parrett NP-C  [facility]  [phone]   10/03/2014, 11:28 AM  Attending:  I have seen and examined the patient with nurse practitioner/resident and agree with the note above.   She is intermittently agitated on exam, no purposeful movements, hand dusky but improving with warm compresses Oxygenation has improved and pressor requirement better but she is oliguric Acidosis slowly improving Will call renal for likely CVVHD Keep hand warm, no IV draws that side, appreciate vascular Wean pressors  Now with Staph bacteremia, staph and strep in sputum, appreciate ID  My CC time is 45 minutes  Heber CarolinaBrent Shanikka Wonders, MD [facility] Pager: [phone] Cell: [phone] If no response, call [phone]

## 2014-10-03 NOTE — Consult Note (Signed)
[facility]     Reason for Consult: Staph aureus bacteremia    Referring Physician: Dr. Mikle BosworthMcQuiad/CHAMP  Active Problems:   DKA (diabetic ketoacidoses)   Cardiac arrest   Foreign body alimentary tract   Septic shock   Encephalopathy acute   . antiseptic oral rinse  7 mL Mouth Rinse QID  . ceFEPime (MAXIPIME) IV  1 g Intravenous Q24H  . chlorhexidine  15 mL Mouth Rinse BID  . heparin  5,000 Units Subcutaneous 3 times per day  . hydrocortisone sodium succinate  50 mg Intravenous Q6H  . insulin aspart  1-3 Units Subcutaneous 6 times per day  . ipratropium-albuterol  3 mL Nebulization Q6H  . pantoprazole (PROTONIX) IV  40 mg Intravenous Q24H  . vancomycin  1,000 mg Intravenous Q24H    Recommendations: Continue with broad spectrum antibiotics   Assessment: She has a presentation of aspiration with beta hemolytic strep on respiratory culture but also Staph aureus in blood and respiratory culture.     Antibiotics: Vancomycin, cefepime  HPI: [name] is a 63 y.o. female with poorly controled DM with last A1C of 12 brought in to ED after being found unresponsive.  Had previous URI symptoms.  Patient intubated in the field and admitted to ICU.  Started initially on broad spectrum antibiotics and requiring pressor support.  Acidodic with initial pH of 6.8.     Review of Systems: Review of systems not obtained due to patient factors.  Past Medical History  Diagnosis Date  . Mixed stress and urge urinary incontinence   . Schizophrenia   . Allergy     History  Substance Use Topics  . Smoking status: Former Smoker -- 3.00 packs/day    Types: Cigarettes  . Smokeless tobacco: Not on file  . Alcohol Use: 3.6 oz/week    6 Cans of beer per week    Family History  Problem Relation Age of Onset  . Heart disease Mother   . Emphysema Paternal Grandfather   . Hypertension Father   . Stroke Father   . Heart disease Father    Allergies    Allergen Reactions  . Penicillins     Rash     OBJECTIVE: Blood pressure 96/41, pulse 89, temperature 98.9 F (37.2 C), temperature source Oral, resp. rate 16, height 5\' 4"  (1.626 m), weight 180 lb 8.9 oz (81.9 kg), SpO2 100 %. General: intubated, sedated Skin: no rashes Lungs: CTA B Cor: tachy rr Abdomen: soft, nt Ext: no edema, pneumoboots  Microbiology: Recent Results (from the past 240 hour(s))  Blood culture (routine x 2)     Status: None (Preliminary result)   Collection Time: 10/01/14  9:50 AM  Result Value Ref Range Status   Specimen Description BLOOD FOREARM LEFT  Final   Special Requests BOTTLES DRAWN AEROBIC AND ANAEROBIC 5CC  Final   Culture  Setup Time   Final    10/01/2014 14:50 Performed at [facility]    Culture   Final    STAPHYLOCOCCUS AUREUS Note: RIFAMPIN AND GENTAMICIN SHOULD NOT BE USED AS SINGLE DRUGS FOR TREATMENT OF STAPH INFECTIONS. Note: Gram Stain Report Called to,Read Back By and Verified With: RITA BROWN 10/02/14 @ 11:34AM BY RUSCOE A. Performed at [facility]    Report Status PENDING  Incomplete  Blood culture (routine x 2)     Status: None (Preliminary result)   Collection Time: 10/01/14 10:00 AM  Result Value Ref Range Status  Specimen Description BLOOD HAND LEFT  Final   Special Requests BOTTLES DRAWN AEROBIC AND ANAEROBIC 5ML  Final   Culture  Setup Time   Final    10/01/2014 14:52 Performed at [facility]    Culture   Final           BLOOD CULTURE RECEIVED NO GROWTH TO DATE CULTURE WILL BE HELD FOR 5 DAYS BEFORE ISSUING A FINAL NEGATIVE REPORT Performed at [facility]    Report Status PENDING  Incomplete  Urine culture     Status: None   Collection Time: 10/01/14 10:13 AM  Result Value Ref Range Status   Specimen Description URINE, CATHETERIZED  Final   Special Requests NONE  Final   Culture  Setup Time   Final    10/01/2014 16:36 Performed at [facility]    Colony Count NO  GROWTH Performed at [facility]   Final   Culture NO GROWTH Performed at [facility]   Final   Report Status 10/02/2014 FINAL  Final  Culture, respiratory (NON-Expectorated)     Status: None (Preliminary result)   Collection Time: 10/01/14 12:50 PM  Result Value Ref Range Status   Specimen Description ENDOTRACHEAL  Final   Special Requests NONE  Final   Gram Stain   Final    MODERATE WBC PRESENT,BOTH PMN AND MONONUCLEAR RARE SQUAMOUS EPITHELIAL CELLS PRESENT ABUNDANT GRAM POSITIVE COCCI IN PAIRS IN CLUSTERS Performed at [facility]    Culture   Final    ABUNDANT STAPHYLOCOCCUS AUREUS Note: RIFAMPIN AND GENTAMICIN SHOULD NOT BE USED AS SINGLE DRUGS FOR TREATMENT OF STAPH INFECTIONS. ABUNDANT STREPTOCOCCUS,BETA HEMOLYTIC NOT GROUP A Note: TESTING AGAINST S. AGALACTIAE NOT ROUTINELY PERFORMED DUE TO PREDICTABILITY OF AMP/PEN/VAN SUSCEPTIBILITY. Performed at [facility]    Report Status PENDING  Incomplete  MRSA PCR Screening     Status: None   Collection Time: 10/01/14  4:04 PM  Result Value Ref Range Status   MRSA by PCR NEGATIVE NEGATIVE Final    Comment:        The GeneXpert MRSA Assay (FDA approved for NASAL specimens only), is one component of a comprehensive MRSA colonization surveillance program. It is not intended to diagnose MRSA infection nor to guide or monitor treatment for MRSA infections.     Staci RighterOMER, Lucretia Pendley, MD [email or web address] [phone] pager  [phone] cell 10/03/2014, 12:54 PM

## 2014-10-04 ENCOUNTER — Inpatient Hospital Stay (HOSPITAL_COMMUNITY): Payer: Medicare Other

## 2014-10-04 DIAGNOSIS — J189 Pneumonia, unspecified organism: Secondary | ICD-10-CM

## 2014-10-04 DIAGNOSIS — N179 Acute kidney failure, unspecified: Secondary | ICD-10-CM

## 2014-10-04 DIAGNOSIS — J9601 Acute respiratory failure with hypoxia: Secondary | ICD-10-CM | POA: Insufficient documentation

## 2014-10-04 LAB — COMPREHENSIVE METABOLIC PANEL
ALT: 40 U/L — AB (ref 0–35)
AST: 34 U/L (ref 0–37)
Albumin: 1.3 g/dL — ABNORMAL LOW (ref 3.5–5.2)
Alkaline Phosphatase: 112 U/L (ref 39–117)
Anion gap: 15 (ref 5–15)
BUN: 43 mg/dL — ABNORMAL HIGH (ref 6–23)
CALCIUM: 8.1 mg/dL — AB (ref 8.4–10.5)
CO2: 15 mEq/L — ABNORMAL LOW (ref 19–32)
Chloride: 98 mEq/L (ref 96–112)
Creatinine, Ser: 3.22 mg/dL — ABNORMAL HIGH (ref 0.50–1.10)
GFR calc non Af Amer: 14 mL/min — ABNORMAL LOW (ref >90)
GFR, EST AFRICAN AMERICAN: 17 mL/min — AB (ref >90)
GLUCOSE: 228 mg/dL — AB (ref 70–99)
Potassium: 4.3 mEq/L (ref 3.7–5.3)
SODIUM: 128 meq/L — AB (ref 137–147)
TOTAL PROTEIN: 4.9 g/dL — AB (ref 6.0–8.3)
Total Bilirubin: 0.2 mg/dL — ABNORMAL LOW (ref 0.3–1.2)

## 2014-10-04 LAB — GLUCOSE, CAPILLARY
GLUCOSE-CAPILLARY: 166 mg/dL — AB (ref 70–99)
GLUCOSE-CAPILLARY: 213 mg/dL — AB (ref 70–99)
GLUCOSE-CAPILLARY: 221 mg/dL — AB (ref 70–99)
GLUCOSE-CAPILLARY: 276 mg/dL — AB (ref 70–99)
Glucose-Capillary: 278 mg/dL — ABNORMAL HIGH (ref 70–99)
Glucose-Capillary: 205 mg/dL — ABNORMAL HIGH (ref 70–99)
Glucose-Capillary: 161 mg/dL — ABNORMAL HIGH (ref 70–99)
Glucose-Capillary: 150 mg/dL — ABNORMAL HIGH (ref 70–99)
Glucose-Capillary: 108 mg/dL — ABNORMAL HIGH (ref 70–99)
Glucose-Capillary: 120 mg/dL — ABNORMAL HIGH (ref 70–99)
Glucose-Capillary: 121 mg/dL — ABNORMAL HIGH (ref 70–99)
Glucose-Capillary: 129 mg/dL — ABNORMAL HIGH (ref 70–99)
Glucose-Capillary: 81 mg/dL (ref 70–99)
Glucose-Capillary: 90 mg/dL (ref 70–99)
Glucose-Capillary: 117 mg/dL — ABNORMAL HIGH (ref 70–99)
Glucose-Capillary: 126 mg/dL — ABNORMAL HIGH (ref 70–99)
Glucose-Capillary: 237 mg/dL — ABNORMAL HIGH (ref 70–99)

## 2014-10-04 LAB — CULTURE, RESPIRATORY

## 2014-10-04 LAB — RENAL FUNCTION PANEL
ALBUMIN: 1.4 g/dL — AB (ref 3.5–5.2)
ANION GAP: 15 (ref 5–15)
BUN: 45 mg/dL — AB (ref 6–23)
CHLORIDE: 97 meq/L (ref 96–112)
CO2: 17 meq/L — AB (ref 19–32)
CREATININE: 3.16 mg/dL — AB (ref 0.50–1.10)
Calcium: 8.2 mg/dL — ABNORMAL LOW (ref 8.4–10.5)
GFR calc Af Amer: 17 mL/min — ABNORMAL LOW (ref >90)
GFR, EST NON AFRICAN AMERICAN: 15 mL/min — AB (ref >90)
Glucose, Bld: 147 mg/dL — ABNORMAL HIGH (ref 70–99)
POTASSIUM: 4.6 meq/L (ref 3.7–5.3)
Phosphorus: 3.1 mg/dL (ref 2.3–4.6)
Sodium: 129 mEq/L — ABNORMAL LOW (ref 137–147)

## 2014-10-04 LAB — POCT I-STAT 3, ART BLOOD GAS (G3+)
ACID-BASE DEFICIT: 11 mmol/L — AB (ref 0.0–2.0)
BICARBONATE: 16 meq/L — AB (ref 20.0–24.0)
O2 Saturation: 95 %
TCO2: 17 mmol/L (ref 0–100)
pCO2 arterial: 36.1 mmHg (ref 35.0–45.0)
pH, Arterial: 7.251 — ABNORMAL LOW (ref 7.350–7.450)
pO2, Arterial: 84 mmHg (ref 80.0–100.0)

## 2014-10-04 LAB — CULTURE, BLOOD (ROUTINE X 2)

## 2014-10-04 LAB — PHOSPHORUS: Phosphorus: 3 mg/dL (ref 2.3–4.6)

## 2014-10-04 LAB — CBC
HCT: 31.9 % — ABNORMAL LOW (ref 36.0–46.0)
HEMOGLOBIN: 10.9 g/dL — AB (ref 12.0–15.0)
MCH: 26.7 pg (ref 26.0–34.0)
MCHC: 34.2 g/dL (ref 30.0–36.0)
MCV: 78 fL (ref 78.0–100.0)
Platelets: 71 10*3/uL — ABNORMAL LOW (ref 150–400)
RBC: 4.09 MIL/uL (ref 3.87–5.11)
RDW: 16.9 % — AB (ref 11.5–15.5)
WBC: 26.7 10*3/uL — ABNORMAL HIGH (ref 4.0–10.5)

## 2014-10-04 LAB — PATHOLOGIST SMEAR REVIEW

## 2014-10-04 LAB — PROTIME-INR
INR: 1.15 (ref 0.00–1.49)
Prothrombin Time: 14.8 seconds (ref 11.6–15.2)

## 2014-10-04 LAB — CULTURE, RESPIRATORY W GRAM STAIN

## 2014-10-04 MED ORDER — HEPARIN (PORCINE) 2000 UNITS/L FOR CRRT
INTRAVENOUS_CENTRAL | Status: DC | PRN
  Administered 2014-10-06: 18:00:00 via INTRAVENOUS_CENTRAL
  Filled 2014-10-04 (×2): qty 1000

## 2014-10-04 MED ORDER — VITAL AF 1.2 CAL PO LIQD
1000.0000 mL | ORAL | Status: DC
  Administered 2014-10-04 – 2014-10-07 (×5): 1000 mL
  Filled 2014-10-04 (×6): qty 1000

## 2014-10-04 MED ORDER — PRISMASOL BGK 4/2.5 32-4-2.5 MEQ/L IV SOLN
INTRAVENOUS | Status: DC
  Administered 2014-10-04 – 2014-10-06 (×5): via INTRAVENOUS_CENTRAL
  Filled 2014-10-04 (×7): qty 5000

## 2014-10-04 MED ORDER — HEPARIN SODIUM (PORCINE) 1000 UNIT/ML DIALYSIS
1000.0000 [IU] | INTRAMUSCULAR | Status: DC | PRN
  Administered 2014-10-06: 4000 [IU] via INTRAVENOUS_CENTRAL
  Filled 2014-10-04: qty 4
  Filled 2014-10-04 (×2): qty 6

## 2014-10-04 MED ORDER — INSULIN GLARGINE 100 UNIT/ML ~~LOC~~ SOLN
10.0000 [IU] | Freq: Every day | SUBCUTANEOUS | Status: DC
  Administered 2014-10-04 – 2014-10-08 (×5): 10 [IU] via SUBCUTANEOUS
  Filled 2014-10-04 (×5): qty 0.1

## 2014-10-04 MED ORDER — PRISMASOL BGK 4/2.5 32-4-2.5 MEQ/L IV SOLN
INTRAVENOUS | Status: DC
  Administered 2014-10-04: 16:00:00 via INTRAVENOUS_CENTRAL
  Filled 2014-10-04 (×4): qty 5000

## 2014-10-04 MED ORDER — PRISMASOL BGK 4/2.5 32-4-2.5 MEQ/L IV SOLN
INTRAVENOUS | Status: DC
Start: 2014-10-04 — End: 2014-10-08
  Administered 2014-10-04 – 2014-10-08 (×20): via INTRAVENOUS_CENTRAL
  Filled 2014-10-04 (×38): qty 5000

## 2014-10-04 MED ORDER — VITAL HIGH PROTEIN PO LIQD
1000.0000 mL | ORAL | Status: DC
  Filled 2014-10-04 (×2): qty 1000

## 2014-10-04 MED ORDER — INSULIN ASPART 100 UNIT/ML ~~LOC~~ SOLN
0.0000 [IU] | SUBCUTANEOUS | Status: DC
  Administered 2014-10-04: 5 [IU] via SUBCUTANEOUS
  Administered 2014-10-05: 3 [IU] via SUBCUTANEOUS
  Administered 2014-10-05: 5 [IU] via SUBCUTANEOUS
  Administered 2014-10-05: 3 [IU] via SUBCUTANEOUS
  Administered 2014-10-05: 8 [IU] via SUBCUTANEOUS
  Administered 2014-10-05: 2 [IU] via SUBCUTANEOUS
  Administered 2014-10-05 – 2014-10-06 (×2): 3 [IU] via SUBCUTANEOUS
  Administered 2014-10-06: 5 [IU] via SUBCUTANEOUS
  Administered 2014-10-06 (×2): 3 [IU] via SUBCUTANEOUS
  Administered 2014-10-07 (×2): 2 [IU] via SUBCUTANEOUS
  Administered 2014-10-07 (×2): 3 [IU] via SUBCUTANEOUS
  Administered 2014-10-07 (×2): 5 [IU] via SUBCUTANEOUS
  Administered 2014-10-08 (×3): 2 [IU] via SUBCUTANEOUS
  Administered 2014-10-08: 3 [IU] via SUBCUTANEOUS
  Administered 2014-10-08: 2 [IU] via SUBCUTANEOUS
  Administered 2014-10-09 (×4): 3 [IU] via SUBCUTANEOUS
  Administered 2014-10-09: 2 [IU] via SUBCUTANEOUS
  Administered 2014-10-10: 5 [IU] via SUBCUTANEOUS
  Administered 2014-10-10 (×3): 3 [IU] via SUBCUTANEOUS
  Administered 2014-10-10: 2 [IU] via SUBCUTANEOUS
  Administered 2014-10-10: 3 [IU] via SUBCUTANEOUS
  Administered 2014-10-10: 5 [IU] via SUBCUTANEOUS
  Administered 2014-10-11 (×3): 2 [IU] via SUBCUTANEOUS
  Administered 2014-10-11 (×2): 3 [IU] via SUBCUTANEOUS
  Administered 2014-10-12: 2 [IU] via SUBCUTANEOUS

## 2014-10-04 MED ORDER — CEFAZOLIN SODIUM-DEXTROSE 2-3 GM-% IV SOLR
2.0000 g | Freq: Two times a day (BID) | INTRAVENOUS | Status: DC
  Administered 2014-10-04 – 2014-10-10 (×12): 2 g via INTRAVENOUS
  Filled 2014-10-04 (×13): qty 50

## 2014-10-04 MED ORDER — PRO-STAT SUGAR FREE PO LIQD
30.0000 mL | Freq: Two times a day (BID) | ORAL | Status: AC
  Administered 2014-10-04 (×2): 30 mL
  Filled 2014-10-04 (×2): qty 30

## 2014-10-04 NOTE — Progress Notes (Signed)
[facility]    Date of Admission:  10/01/2014   Total days of antibiotics 4        Day 4cefepime        Day 3 vanco           ID: Narda RutherfordDeborah A Winther is a 63 y.o. female with poorly controled DM with last A1C of 12 brought in to ED after being found unresponsive. Had previous URI symptoms. Patient intubated in the field and admitted to ICU. Started initially on broad spectrum antibiotics and requiring pressor support. Acidodic with initial pH of 6.8. Remains having pressor needs, multi organ dysfunction Active Problems:   DKA (diabetic ketoacidoses)   Cardiac arrest   Foreign body alimentary tract   Septic shock   Encephalopathy acute    Subjective: Afebrile, but continues to have pressor needs. AKI worsening needing CRRT  Medications:  . antiseptic oral rinse  7 mL Mouth Rinse QID  .  ceFAZolin (ANCEF) IV  2 g Intravenous Q12H  . chlorhexidine  15 mL Mouth Rinse BID  . feeding supplement (PRO-STAT SUGAR FREE 64)  30 mL Per Tube BID  . heparin  5,000 Units Subcutaneous 3 times per day  . insulin glargine  10 Units Subcutaneous Daily  . ipratropium-albuterol  3 mL Nebulization Q6H  . pantoprazole (PROTONIX) IV  40 mg Intravenous Q24H    Objective: Vital signs in last 24 hours: Temp:  [97.4 F (36.3 C)-98.5 F (36.9 C)] 97.4 F (36.3 C) (11/16 1134) Pulse Rate:  [79-118] 79 (11/16 1153) Resp:  [0-35] 35 (11/16 1153) BP: (91-125)/(36-71) 112/53 mmHg (11/16 1153) SpO2:  [95 %-100 %] 100 % (11/16 1153) FiO2 (%):  [40 %] 40 % (11/16 1153) Weight:  [188 lb 0.8 oz (85.3 kg)] 188 lb 0.8 oz (85.3 kg) (11/16 0545) Physical Exam  Constitutional:  Chronically ill, older than stated age, unresponsive  HENT:  Mouth/Throat: ETT in place Cardiovascular: tachycardic regular rhythm and normal heart sounds. Exam reveals no gallop and no friction rub.  No murmur heard.  Pulmonary/Chest: course BS bilateteraly Neurological: unresponsive Skin: cool  extremities, mottling on RU   Lab Results  Recent Labs  10/03/14 0412 10/03/14 0703 10/04/14 0330  WBC  --  25.6* 26.7*  HGB  --  11.8* 10.9*  HCT  --  35.2* 31.9*  NA 133*  --  128*  K 4.1  --  4.3  CL 102  --  98  CO2 17*  --  15*  BUN 36*  --  43*  CREATININE 2.72*  --  3.22*   Liver Panel  Recent Labs  10/04/14 0330  PROT 4.9*  ALBUMIN 1.3*  AST 34  ALT 40*  ALKPHOS 112  BILITOT 0.2*   Sedimentation Rate No results for input(s): ESRSEDRATE in the last 72 hours. C-Reactive Protein No results for input(s): CRP in the last 72 hours.  Microbiology: 11/14 urine cx NGTD 11/13 blood cx 1 of 2 blood cx MSSA 11/13 urine cx NGTD 11/13 endotrachael specimen cx b-hemolytic strep and MSSA  Studies/Results: [facility]  10/04/2014   CLINICAL DATA:  63 year old female with acute renal injury. Initial encounter. Current history of recent cardiac arrest.  EXAM: RENAL/URINARY TRACT ULTRASOUND COMPLETE  COMPARISON:  Non contrast CT Abdomen and Pelvis 10/01/2014.  FINDINGS: Right Kidney:  Length: 12.5 cm. Echogenicity at the upper limits of normal. No mass or hydronephrosis visualized.  Left Kidney:  Length: 12.9 cm. Echogenicity within normal limits. No  mass or hydronephrosis visualized.  Bladder:  Not visualized, reportedly Foley catheter is in place.  Other findings: Small volume ascites re- identified. Small right pleural effusion re - identified.  IMPRESSION: 1. No acute renal findings. 2. Small volume ascites and small right pleural effusion re- identified.   Electronically Signed   By: Augusto GambleLee  Hall M.D.   On: 10/04/2014 09:00   Dg Chest Port 1 View  10/04/2014   CLINICAL DATA:  Pneumonia; history of cardiac arrest diabetic ketoacidosis, septic shock  EXAM: PORTABLE CHEST - 1 VIEW  COMPARISON:  Portable chest x-ray of October 02, 2014  FINDINGS: The lungs are slightly less well inflated today. The pulmonary interstitial markings are increased and are slightly more confluent.  Confluent density peripherally in the left upper lobe is consistent with developing pneumonia. There is partial obscuration of the left hemidiaphragm.  The cardiac silhouette is top-normal in size. The central pulmonary vascularity is mildly engorged. The endotracheal tube tip lies 2.7 cm above the crotch of the carina. The esophagogastric tube tip and proximal port project below the left hemidiaphragm. The right subclavian venous catheter tip projects over the distal SVC.  IMPRESSION: Interval worsening in the appearance of the pulmonary interstitium is in part due to hypoinflation but worsening interstitial edema bilaterally and developing left upper lobe pneumonia or atelectasis is demonstrated. The support tubes and lines are in appropriate position.   Electronically Signed   By: David  Swazil[place]Jordan   On: 10/04/2014 07:51     Assessment/Plan: MSSA bacteremia = will narrow to cefazolin, will repeat blood cx today. Can d/c vanco and cefepime  Pneumonia = will continue on cefazolin  aki = followed by renal and primary team for CRRT    Drue SecondSNIDER, [facility] Cell: [phone] Pager: [phone]  10/04/2014, 1:30 PM

## 2014-10-04 NOTE — Progress Notes (Signed)
ANTIBIOTIC CONSULT NOTE - Follow Up  Pharmacy Consult for Cefazolin Indication: MSSA bacteremia    Allergies  Allergen Reactions  . Penicillins     Rash     Patient Measurements: Height: 5\' 4"  (162.6 cm) Weight: 188 lb 0.8 oz (85.3 kg) IBW/kg (Calculated) : 54.7   Vital Signs: Temp: 97.4 F (36.3 C) (11/16 1134) Temp Source: Oral (11/16 1134) BP: 112/53 mmHg (11/16 1153) Pulse Rate: 79 (11/16 1153) Intake/Output from previous day: 11/15 0701 - 11/16 0700 In: 4397.7 [I.V.:3671.7; IV Piggyback:726] Out: 102 [Urine:102] Intake/Output from this shift:    Labs:  Recent Labs  10/02/14 0200  10/02/14 2205 10/03/14 0412 10/03/14 0703 10/03/14 1737 10/04/14 0330  WBC 7.6  --   --   --  25.6*  --  26.7*  HGB 11.7*  --   --   --  11.8*  --  10.9*  PLT 152  --   --   --  83*  --  71*  LABCREA  --   --   --   --   --  151.33  --   CREATININE 1.85*  < > 2.60* 2.72*  --   --  3.22*  < > = values in this interval not displayed. Estimated Creatinine Clearance: 19.4 mL/min (by C-G formula based on Cr of 3.22). No results for input(s): VANCOTROUGH, VANCOPEAK, VANCORANDOM, GENTTROUGH, GENTPEAK, GENTRANDOM, TOBRATROUGH, TOBRAPEAK, TOBRARND, AMIKACINPEAK, AMIKACINTROU, AMIKACIN in the last 72 hours.   Microbiology: Recent Results (from the past 720 hour(s))  Blood culture (routine x 2)     Status: None   Collection Time: 10/01/14  9:50 AM  Result Value Ref Range Status   Specimen Description BLOOD FOREARM LEFT  Final   Special Requests BOTTLES DRAWN AEROBIC AND ANAEROBIC 5CC  Final   Culture  Setup Time   Final    10/01/2014 14:50 Performed at [facility]    Culture   Final    STAPHYLOCOCCUS AUREUS Note: RIFAMPIN AND GENTAMICIN SHOULD NOT BE USED AS SINGLE DRUGS FOR TREATMENT OF STAPH INFECTIONS. Note: Gram Stain Report Called to,Read Back By and Verified With: RITA BROWN 10/02/14 @ 11:34AM BY RUSCOE A. Performed at [facility]    Report Status 10/04/2014  FINAL  Final   Organism ID, Bacteria STAPHYLOCOCCUS AUREUS  Final      Susceptibility   Staphylococcus aureus - MIC*    CLINDAMYCIN <=0.25 SENSITIVE Sensitive     ERYTHROMYCIN <=0.25 SENSITIVE Sensitive     GENTAMICIN <=0.5 SENSITIVE Sensitive     LEVOFLOXACIN 0.25 SENSITIVE Sensitive     OXACILLIN 0.5 SENSITIVE Sensitive     PENICILLIN SENSITIVE      RIFAMPIN <=0.5 SENSITIVE Sensitive     TRIMETH/SULFA <=10 SENSITIVE Sensitive     VANCOMYCIN 1 SENSITIVE Sensitive     TETRACYCLINE <=1 SENSITIVE Sensitive     MOXIFLOXACIN <=0.25 SENSITIVE Sensitive     * STAPHYLOCOCCUS AUREUS  Blood culture (routine x 2)     Status: None (Preliminary result)   Collection Time: 10/01/14 10:00 AM  Result Value Ref Range Status   Specimen Description BLOOD HAND LEFT  Final   Special Requests BOTTLES DRAWN AEROBIC AND ANAEROBIC 5ML  Final   Culture  Setup Time   Final    10/01/2014 14:52 Performed at [facility]    Culture   Final           BLOOD CULTURE RECEIVED NO GROWTH TO DATE CULTURE WILL BE HELD FOR 5 DAYS  BEFORE ISSUING A FINAL NEGATIVE REPORT Performed at [facility]    Report Status PENDING  Incomplete  Urine culture     Status: None   Collection Time: 10/01/14 10:13 AM  Result Value Ref Range Status   Specimen Description URINE, CATHETERIZED  Final   Special Requests NONE  Final   Culture  Setup Time   Final    10/01/2014 16:36 Performed at [facility]    Colony Count NO GROWTH Performed at [facility]   Final   Culture NO GROWTH Performed at [facility]   Final   Report Status 10/02/2014 FINAL  Final  Culture, respiratory (NON-Expectorated)     Status: None   Collection Time: 10/01/14 12:50 PM  Result Value Ref Range Status   Specimen Description ENDOTRACHEAL  Final   Special Requests NONE  Final   Gram Stain   Final    MODERATE WBC PRESENT,BOTH PMN AND MONONUCLEAR RARE SQUAMOUS EPITHELIAL CELLS PRESENT ABUNDANT GRAM POSITIVE  COCCI IN PAIRS IN CLUSTERS Performed at [facility]    Culture   Final    ABUNDANT STAPHYLOCOCCUS AUREUS Note: RIFAMPIN AND GENTAMICIN SHOULD NOT BE USED AS SINGLE DRUGS FOR TREATMENT OF STAPH INFECTIONS. ABUNDANT STREPTOCOCCUS,BETA HEMOLYTIC NOT GROUP A Note: TESTING AGAINST S. AGALACTIAE NOT ROUTINELY PERFORMED DUE TO PREDICTABILITY OF AMP/PEN/VAN SUSCEPTIBILITY. Performed at [facility]    Report Status 10/04/2014 FINAL  Final   Organism ID, Bacteria STAPHYLOCOCCUS AUREUS  Final      Susceptibility   Staphylococcus aureus - MIC*    CLINDAMYCIN <=0.25 SENSITIVE Sensitive     ERYTHROMYCIN <=0.25 SENSITIVE Sensitive     GENTAMICIN <=0.5 SENSITIVE Sensitive     LEVOFLOXACIN 0.25 SENSITIVE Sensitive     OXACILLIN 0.5 SENSITIVE Sensitive     PENICILLIN 0.06 SENSITIVE Sensitive     RIFAMPIN <=0.5 SENSITIVE Sensitive     TRIMETH/SULFA <=10 SENSITIVE Sensitive     VANCOMYCIN <=0.5 SENSITIVE Sensitive     TETRACYCLINE <=1 SENSITIVE Sensitive     MOXIFLOXACIN <=0.25 SENSITIVE Sensitive     * ABUNDANT STAPHYLOCOCCUS AUREUS  MRSA PCR Screening     Status: None   Collection Time: 10/01/14  4:04 PM  Result Value Ref Range Status   MRSA by PCR NEGATIVE NEGATIVE Final    Comment:        The GeneXpert MRSA Assay (FDA approved for NASAL specimens only), is one component of a comprehensive MRSA colonization surveillance program. It is not intended to diagnose MRSA infection nor to guide or monitor treatment for MRSA infections.   Urine culture     Status: None   Collection Time: 10/02/14  3:15 AM  Result Value Ref Range Status   Specimen Description URINE, RANDOM  Final   Special Requests NONE  Final   Culture  Setup Time   Final    10/02/2014 10:55 Performed at [facility]    Colony Count NO GROWTH Performed at [facility]   Final   Culture NO GROWTH Performed at [facility]   Final   Report Status 10/03/2014 FINAL  Final     Assessment: 63 YOF brought into the [facility] with DKA requiring intubation by CCM to protect airway. Now pt with MSSA bacteremia. Afebrile, WBC elevated 26.7  SCr 2.72 >> 3.22, estimate CrCl ~19 ml/min. Will start CRRT Today (11/16)   11/13 Cefepime >> 11/16 11/14 Vancomycin >> 11/16  11/16 Cefazolin >>  11/13 Bld x2>> MSSA (1/2) 11/13  Endotrach>> MSSA 11/14 Urine>> NGF MRSA PCR neg  Goal of Therapy:  Resolution of Infection   Plan:  1. Start Cefazolin 2 g Q 12 (CRRT dosing) 2. Will f/u micro data, pt's clinical condition, renal function 3. D/C Vanc, cefepime  Gillermo Murdochuhani Devorah Givhan, PharmD Candidate  10/04/14

## 2014-10-04 NOTE — Progress Notes (Signed)
NUTRITION FOLLOW UP / CONSULT  Intervention:    Initiate TF via OGT with Vital AF 1.2 at 25 ml/h and Prostat 30 ml BID on day 1; on day 2, d/c Prostat and increase to goal rate of 55 ml/h (1320 ml per day) to provide 1584 kcals, 99 gm protein, 1071 ml free water daily.  Nutrition Dx:   Inadequate oral intake related to inability to eat as evidenced by NPO status, ongoing.  Goal:   Intake to meet >90% of estimated nutrition needs. Unmet.  Monitor:   TF tolerance/adequacy, weight trend, labs, vent status.  Assessment:   63 year old female with PMH of diabetes and medical non-compliance.Husband woke up this AM and was unable to arouse patient. In the ED patient became unresponsive and was intubated. Pt presents with AMS, DKA, and VDRF.  Received MD Consult for TF initiation and management. Discussed patient in ICU rounds today. Nutrition focused physical exam completed.  No muscle or subcutaneous fat depletion noticed.  Patient is currently intubated on ventilator support MV: 15.9 L/min Temp (24hrs), Avg:98.2 F (36.8 C), Min:97.4 F (36.3 C), Max:98.5 F (36.9 C)   Height: Ht Readings from Last 1 Encounters:  10/02/14 5\' 4"  (1.626 m)    Weight Status:   Wt Readings from Last 1 Encounters:  10/04/14 188 lb 0.8 oz (85.3 kg)   10/01/14 144 lb 13.5 oz (65.7 kg)    Re-estimated needs:  Kcal: 1606 Protein: 90-105 gm Fluid: 1.6 L  Skin: WNL  Diet Order: Diet NPO time specified   Intake/Output Summary (Last 24 hours) at 10/04/14 1153 Last data filed at 10/04/14 0646  Gross per 24 hour  Intake 3610.14 ml  Output     87 ml  Net 3523.14 ml    Last BM: none documented since admission   Labs:   Recent Labs Lab 10/01/14 1649  10/02/14 0200  10/02/14 2205 10/03/14 0412 10/04/14 0330  NA 140  < > 137  < > 133* 133* 128*  K 3.5*  < > 2.6*  < > 3.9 4.1 4.3  CL 108  < > 108  < > 102 102 98  CO2 11*  < > 14*  < > 18* 17* 15*  BUN 27*  < > 30*  < > 35* 36* 43*   CREATININE 1.34*  < > 1.85*  < > 2.60* 2.72* 3.22*  CALCIUM 7.4*  < > 7.9*  < > 8.3* 8.2* 8.1*  MG 1.6  --  1.8  --   --  1.8  --   PHOS 2.4  --  <0.5*  --   --  1.9* 3.0  GLUCOSE 474*  < > 419*  < > 208* 121* 228*  < > = values in this interval not displayed.  CBG (last 3)   Recent Labs  10/04/14 0452 10/04/14 0554 10/04/14 0646  GLUCAP 161* 166* 150*    Scheduled Meds: . antiseptic oral rinse  7 mL Mouth Rinse QID  . chlorhexidine  15 mL Mouth Rinse BID  . feeding supplement (VITAL HIGH PROTEIN)  1,000 mL Per Tube Q24H  . heparin  5,000 Units Subcutaneous 3 times per day  . ipratropium-albuterol  3 mL Nebulization Q6H  . pantoprazole (PROTONIX) IV  40 mg Intravenous Q24H    Continuous Infusions: . sodium chloride Stopped (10/03/14 1507)  . fentaNYL infusion INTRAVENOUS 100 mcg/hr (10/04/14 0813)  . insulin (NOVOLIN-R) infusion 0.8 Units/hr (10/04/14 1138)  . norepinephrine (LEVOPHED) Adult infusion Stopped (10/04/14  Leslie.Mor0645)  . phenylephrine (NEO-SYNEPHRINE) Adult infusion 140 mcg/min (10/04/14 0742)  . dialysis replacement fluid (prismasate)    . dialysis replacement fluid (prismasate)    . dialysate (PRISMASATE)    . vasopressin (PITRESSIN) infusion - *FOR SHOCK* 0.03 Units/min (10/03/14 2000)    Joaquin CourtsKimberly Rayane Gallardo, RD, LDN, CNSC Pager [phone] After Hours Pager [phone]

## 2014-10-04 NOTE — Procedures (Signed)
Hemodialysis Insertion Procedure Note Abigail RutherfordDeborah A Zamora [id number] 1951/06/27  Procedure: Insertion of Hemodialysis Catheter Type: 3 port  Indications: Hemodialysis   Procedure Details Consent: Risks of procedure as well as the alternatives and risks of each were explained to the (patient/caregiver).  Consent for procedure obtained. Time Out: Verified patient identification, verified procedure, site/side was marked, verified correct patient position, special equipment/implants available, medications/allergies/relevent history reviewed, required imaging and test results available.  Performed  Maximum sterile technique was used including antiseptics, cap, gloves, gown, hand hygiene, mask and sheet. Skin prep: Chlorhexidine; local anesthetic administered A antimicrobial bonded/coated triple lumen catheter was placed in the left internal jugular vein using the Seldinger technique. Ultrasound guidance used.Yes.   Catheter placed to 20 cm. Blood aspirated via all 3 ports and then flushed x 3. Line sutured x 2 and dressing applied.  Evaluation Blood flow good Complications: No apparent complications Patient did tolerate procedure well. Chest X-ray ordered to verify placement.  CXR: pending.  Abigail RoachPaul Zamora, ACNP [facility] Pager [phone] or [phone]    US guidance Tolerated well  Abigail RossettiDaniel J. Zamora AliasFeinstein, MD, FACP Pgr: [phone] [facility]

## 2014-10-04 NOTE — Progress Notes (Signed)
eLink Physician-Brief Progress Note Patient Name: Abigail RutherfordDeborah A Zamora DOB: November 08, 1951 MRN: [id number]   Date of Service  10/04/2014  HPI/Events of Note    eICU Interventions  Transitioned off insulin gtt to SSI     Intervention Category Intermediate Interventions: Hyperglycemia - evaluation and treatment  Billy FischerDavid Simonds 10/04/2014, 7:33 PM

## 2014-10-04 NOTE — Consult Note (Signed)
Right hand less dusky and warmer Off levophed Weaning Neo Still on Vasopressin  Continue to protect hand  Wean pressors as tolerated.  Fabienne Brunsharles Mineola Duan, MD [facility] Office: [phone] Pager: [phone]

## 2014-10-04 NOTE — Progress Notes (Signed)
Patient ID: [name], female   DOB: June 04, 1951, 63 y.o.   MRN: [id number]  [facility] Progress Note    Assessment/ Plan:   1. Acute renal injury-oliguric:urine output remains marginal and labs continue to show worsening renal function. Given current volume excess/hypotension and lack of renal clearance, will start renal replacement therapy as we await renal recovery with control of underlying sepsis. Fractional excretion of sodium less than 1% but currently 20 L positive. 2. Metabolic acidosis: likely secondary to sepsis plus/minus DKA/metformin use. Currently on intravenous sodium bicarbonate drip, will discontinue this after CRRT started. 3. Altered mental status: encephalopathy suspected to be from sepsis versus diabetic ketoacidosis. 4. Aspiration pneumonia/sepsis: On empiric antibiotic therapy, continue to monitor. 5. Right hand ischemia:improving right hand perfusion-pulses still faint on Doppler. Remains on pressors and right arm protected.  Subjective:   No acute events noted overnight-urine output remains marginal   Objective:   BP 98/57 mmHg  Pulse 89  Temp(Src) 97.8 F (36.6 C) (Oral)  Resp 22  Ht 5\' 4"  (1.626 m)  Wt 85.3 kg (188 lb 0.8 oz)  BMI 32.26 kg/m2  SpO2 98%  Intake/Output Summary (Last 24 hours) at 10/04/14 0757 Last data filed at 10/04/14 0646  Gross per 24 hour  Intake 4397.71 ml  Output    102 ml  Net 4295.71 ml   Weight change: 3.4 kg (7 lb 7.9 oz)  Physical Exam: WUJ:WJXBJYNWGGen:intubated, awake, no meaningful response to commands-in restraints NFA:OZHYQCVS:pulse regular in rate and rhythm, distant S1 and S2 Resp:mechanical breath sounds bilaterally-no rales MVH:QIONAbd:soft, obese, nontender Ext:2-3+ edema over upper and lower extremities. Right hand cold and mottled.  Imaging: Dg Chest Port 1 View  10/04/2014   CLINICAL DATA:  Pneumonia; history of cardiac arrest diabetic ketoacidosis, septic shock  EXAM: PORTABLE CHEST - 1 VIEW  COMPARISON:   Portable chest x-ray of October 02, 2014  FINDINGS: The lungs are slightly less well inflated today. The pulmonary interstitial markings are increased and are slightly more confluent. Confluent density peripherally in the left upper lobe is consistent with developing pneumonia. There is partial obscuration of the left hemidiaphragm.  The cardiac silhouette is top-normal in size. The central pulmonary vascularity is mildly engorged. The endotracheal tube tip lies 2.7 cm above the crotch of the carina. The esophagogastric tube tip and proximal port project below the left hemidiaphragm. The right subclavian venous catheter tip projects over the distal SVC.  IMPRESSION: Interval worsening in the appearance of the pulmonary interstitium is in part due to hypoinflation but worsening interstitial edema bilaterally and developing left upper lobe pneumonia or atelectasis is demonstrated. The support tubes and lines are in appropriate position.   Electronically Signed   By: David  [place]   On: 10/04/2014 07:51    Labs: BMET  Recent Labs Lab 10/01/14 1649  10/02/14 0200 10/02/14 0600 10/02/14 0952 10/02/14 1430 10/02/14 1800 10/02/14 2205 10/03/14 0412 10/04/14 0330  NA 140  < > 137 139 135* 132* 137 133* 133* 128*  K 3.5*  < > 2.6* 3.1* 2.9* 3.5* 3.8 3.9 4.1 4.3  CL 108  < > 108 107 103 100 104 102 102 98  CO2 11*  < > 14* 14* 14* 17* 16* 18* 17* 15*  GLUCOSE 474*  < > 419* 404* 400* 357* 251* 208* 121* 228*  BUN 27*  < > 30* 30* 32* 33* 35* 35* 36* 43*  CREATININE 1.34*  < > 1.85* 1.99* 2.02* 2.24* 2.33* 2.60* 2.72* 3.22*  CALCIUM 7.4*  < > 7.9* 7.7* 8.0* 8.0* 8.2* 8.3* 8.2* 8.1*  PHOS 2.4  --  <0.5*  --   --   --   --   --  1.9* 3.0  < > = values in this interval not displayed. CBC  Recent Labs Lab 10/01/14 1620 10/02/14 0200 10/03/14 0703 10/04/14 0330  WBC 6.9 7.6 25.6* 26.7*  NEUTROABS 6.1  --   --   --   HGB 13.6 11.7* 11.8* 10.9*  HCT 40.8 34.7* 35.2* 31.9*  MCV 81.1 78.0 78.7  78.0  PLT 252 152 83* 71*   Medications:    . antiseptic oral rinse  7 mL Mouth Rinse QID  . ceFEPime (MAXIPIME) IV  1 g Intravenous Q24H  . chlorhexidine  15 mL Mouth Rinse BID  . heparin  5,000 Units Subcutaneous 3 times per day  . hydrocortisone sodium succinate  50 mg Intravenous Q6H  . ipratropium-albuterol  3 mL Nebulization Q6H  . pantoprazole (PROTONIX) IV  40 mg Intravenous Q24H  . vancomycin  1,000 mg Intravenous Q24H   Zetta BillsJay Marlyn Rabine, MD 10/04/2014, 7:57 AM

## 2014-10-04 NOTE — Progress Notes (Signed)
[facility] arterial stick for labs.  Nurses unable to draw back from central line, R radial aline now out r/y RUE mottling.  Attempted L femoral a-line placement, got good blood return and was able to draw all labs but unable to thread wire for a-line placement.   Dirk DressKaty Klaus Casteneda, NP 10/04/2014  3:39 AM Pager: [phone] or [phone]

## 2014-10-04 NOTE — Progress Notes (Signed)
UR Completed.  Zuma Hust Jane [phone] 10/04/2014  

## 2014-10-04 NOTE — Progress Notes (Signed)
PULMONARY / CRITICAL CARE MEDICINE   Name: [name] MRN: [id number] DOB: 14-Jun-1951    ADMISSION DATE:  10/01/2014 CONSULTATION DATE:  10/01/2014  REFERRING MD :  EDP  CHIEF COMPLAINT:  DKA, respiratory failure and AMS  INITIAL PRESENTATION: 63 year old female with PMH of diabetes and medical non-compliance who was last seen normal the day PTP at 8 PM.  Husband woke up this AM and was unable to arouse patient.  Evidently both have been "fighting a cold" but patient was coughing more the night before.  EMS was called and patient was brought to the ED.  In the ED patient became unresponsive and was intubated by EDP.  PCCM was called to admit.  STUDIES:  11/13 CXR with aspiration PNA and abdominal x-ray ? dentures in her lower abdomen. 11/13 CT abd/pelvis, no foreign body noted ? Overlap of bowel walls, neg for obstruction  11/14 Echo >EF 35-40%, small pericardial effusion  11/15 Arterial doppler RUE > 11/16- renal US, ascites, no hydro  SIGNIFICANT EVENTS: 11/13 Presenting with AMS, DKA and VDRF 11/16- remeain in shock, renal failure  SUBJECTIVE: neo at 140, vaso remains  VITAL SIGNS: Temp:  [97.8 F (36.6 C)-98.5 F (36.9 C)] 97.8 F (36.6 C) (11/16 0749) Pulse Rate:  [85-118] 93 (11/16 0839) Resp:  [0-24] 22 (11/16 0839) BP: (91-125)/(34-71) 114/63 mmHg (11/16 0839) SpO2:  [95 %-100 %] 98 % (11/16 0839) FiO2 (%):  [40 %] 40 % (11/16 0839) Weight:  [85.3 kg (188 lb 0.8 oz)] 85.3 kg (188 lb 0.8 oz) (11/16 0545) HEMODYNAMICS: CVP:  [11 mmHg-14 mmHg] 14 mmHg VENTILATOR SETTINGS: Vent Mode:  [-] PRVC FiO2 (%):  [40 %] 40 % Set Rate:  [18 bmp-22 bmp] 22 bmp Vt Set:  [440 mL] 440 mL PEEP:  [8 cmH20-10 cmH20] 8 cmH20 Plateau Pressure:  [13 cmH20-24 cmH20] 13 cmH20 INTAKE / OUTPUT:  Intake/Output Summary (Last 24 hours) at 10/04/14 0939 Last data filed at 10/04/14 0646  Gross per 24 hour  Intake 3971.16 ml  Output     97 ml  Net 3874.16 ml    PHYSICAL  EXAMINATION: General:  Chronically ill appearing female who appears older than stated age, Neuro:  Unresponsive, moving all ext spontaneously equal, NOT following commands, perrl HEENT:  jvd up Cardiovascular:  Regular, tachy, Nl S1/S2, -M/R/G. Lungs: ronchi reduced Abdomen:  Soft, NT, ND and +BS. Musculoskeletal:  3 +gen edema  RUE >hand mottled , cool to touch , doppler pulse difficult Skin:  Thin but intact.  LABS:  CBC  Recent Labs Lab 10/02/14 0200 10/03/14 0703 10/04/14 0330  WBC 7.6 25.6* 26.7*  HGB 11.7* 11.8* 10.9*  HCT 34.7* 35.2* 31.9*  PLT 152 83* 71*   Coag's No results for input(s): APTT, INR in the last 168 hours. BMET  Recent Labs Lab 10/02/14 2205 10/03/14 0412 10/04/14 0330  NA 133* 133* 128*  K 3.9 4.1 4.3  CL 102 102 98  CO2 18* 17* 15*  BUN 35* 36* 43*  CREATININE 2.60* 2.72* 3.22*  GLUCOSE 208* 121* 228*   Electrolytes  Recent Labs Lab 10/01/14 1649  10/02/14 0200  10/02/14 2205 10/03/14 0412 10/04/14 0330  CALCIUM 7.4*  < > 7.9*  < > 8.3* 8.2* 8.1*  MG 1.6  --  1.8  --   --  1.8  --   PHOS 2.4  --  <0.5*  --   --  1.9* 3.0  < > = values in this interval not  displayed. Sepsis Markers  Recent Labs Lab 10/01/14 1007 10/01/14 1650 10/03/14 0421  LATICACIDVEN 1.28 1.6 1.9   ABG  Recent Labs Lab 10/03/14 0433 10/03/14 1205 10/03/14 2147  PHART 7.159* 7.171* 7.216*  PCO2ART 47.5* 45.4* 36.8  PO2ART 115.0* 80.0 68.0*   Liver Enzymes  Recent Labs Lab 10/01/14 0950 10/04/14 0330  AST 37 34  ALT 14 40*  ALKPHOS 121* 112  BILITOT 0.4 0.2*  ALBUMIN 2.4* 1.3*   Cardiac Enzymes  Recent Labs Lab 10/01/14 0950 10/01/14 1649 10/02/14 0001  TROPONINI  --  <0.30 <0.30  PROBNP 2023.0*  --   --    Glucose  Recent Labs Lab 10/04/14 0159 10/04/14 0300 10/04/14 0358 10/04/14 0452 10/04/14 0554 10/04/14 0646  GLUCAP 221* 213* 205* 161* 166* 150*    Imaging No results found.   ASSESSMENT /  PLAN:  PULMONARY OETT 11/13>>> A: Acute resp failure, uncompensated met acidosis PNA  P:   -ABg reviewed, ensure at 8 cc/kg, rate to 30 on vent, abg in 1 hr -goals is ph greater 7.20 - F/U ABG in am  - F/U CXR in am  -Add BD  -no role bicarb, could harm   CARDIOVASCULAR CVL L [place] T[facility] 11/13>>> A: Hypotension likely related to acidosis and sepsis. Neg troponins .  CHF >echo 11/14 w/ decreased EF  11/15>max pressors unable to wean, CVP 13  RUE/Hand with mottling  P:  - CVP 14, kvo, 11 liter pos  - titrate pressors for MAP >65  -dc stress dose steroids as cortisol 60 -Vascular consult reviewed -check Arterial Doppler of R Arm per vascular   RENAL A:  Severe metabolic acidosis. Acute renal failure w/ rising scr and decreased UOP  Hypomagnesium  Hypophosatemia  Hypokalemia   P:   - dc bicarb -bmet frequent cvvhd planned  GASTROINTESTINAL A:  ? Swallowed dentures>not seen on CT abd/pelvis , appreciate GI input , neg on CT P: -  - NGT. -start feeds  HEMATOLOGIC A:  Anemia, thrombocytopenia  P:  -SEPSIS RELATED, may need to dc sub q hep -scd -cbc in am   INFECTIOUS A:  Likely aspiration PNA. Sepsis  MSSA bacteremia P:   BCx2 11/13>>>1/2 staph aureus > UC 11/13>>> Sputum 11/13>>>GPC > Abx: Cefepime, start date 11/13>>> Vanc 11/14 > day 2/x, consider dc  Per ID  Role TEE, likely needed Will discuss with husnabnd any hardware  ENDOCRINE A:  DM and DKA>BS tr down on Insulin drip  Cortisol high 11/13  P:   - Transition back to insulin drip - CBGs.  NEUROLOGIC A:  AMS due to DKA and acidosis. P:   RASS goal: 0 - PRN versed and fentanyl.  FAMILY  Husband updated in full - Updates: sister, long time significant other updated at bedside at length by me on 11/15   TODAY'S SUMMARY: MODs remains, tee>?, for hd cvvhd, increase MV on vent  Ccm time 35 min   Lavon Paganini. Titus Mould, MD, [place] Pgr: [facility]

## 2014-10-05 ENCOUNTER — Inpatient Hospital Stay (HOSPITAL_COMMUNITY): Payer: Medicare Other

## 2014-10-05 DIAGNOSIS — R400 Somnolence: Secondary | ICD-10-CM

## 2014-10-05 DIAGNOSIS — G931 Anoxic brain damage, not elsewhere classified: Secondary | ICD-10-CM

## 2014-10-05 DIAGNOSIS — J9600 Acute respiratory failure, unspecified whether with hypoxia or hypercapnia: Secondary | ICD-10-CM

## 2014-10-05 DIAGNOSIS — I469 Cardiac arrest, cause unspecified: Secondary | ICD-10-CM

## 2014-10-05 LAB — RENAL FUNCTION PANEL
ANION GAP: 15 (ref 5–15)
Albumin: 1.4 g/dL — ABNORMAL LOW (ref 3.5–5.2)
Albumin: 1.3 g/dL — ABNORMAL LOW (ref 3.5–5.2)
Anion gap: 14 (ref 5–15)
BUN: 37 mg/dL — ABNORMAL HIGH (ref 6–23)
BUN: 33 mg/dL — AB (ref 6–23)
CALCIUM: 7.7 mg/dL — AB (ref 8.4–10.5)
CHLORIDE: 97 meq/L (ref 96–112)
CO2: 19 meq/L (ref 19–32)
CO2: 20 meq/L (ref 19–32)
CREATININE: 2.45 mg/dL — AB (ref 0.50–1.10)
Calcium: 8.1 mg/dL — ABNORMAL LOW (ref 8.4–10.5)
Chloride: 97 mEq/L (ref 96–112)
Creatinine, Ser: 2.1 mg/dL — ABNORMAL HIGH (ref 0.50–1.10)
GFR calc Af Amer: 23 mL/min — ABNORMAL LOW (ref >90)
GFR calc Af Amer: 28 mL/min — ABNORMAL LOW (ref >90)
GFR calc non Af Amer: 20 mL/min — ABNORMAL LOW (ref >90)
GFR calc non Af Amer: 24 mL/min — ABNORMAL LOW (ref >90)
GLUCOSE: 213 mg/dL — AB (ref 70–99)
Glucose, Bld: 228 mg/dL — ABNORMAL HIGH (ref 70–99)
PHOSPHORUS: 1.8 mg/dL — AB (ref 2.3–4.6)
POTASSIUM: 3.9 meq/L (ref 3.7–5.3)
POTASSIUM: 3.8 meq/L (ref 3.7–5.3)
Phosphorus: 2.1 mg/dL — ABNORMAL LOW (ref 2.3–4.6)
SODIUM: 132 meq/L — AB (ref 137–147)
Sodium: 130 mEq/L — ABNORMAL LOW (ref 137–147)

## 2014-10-05 LAB — GLUCOSE, CAPILLARY
GLUCOSE-CAPILLARY: 182 mg/dL — AB (ref 70–99)
GLUCOSE-CAPILLARY: 211 mg/dL — AB (ref 70–99)
GLUCOSE-CAPILLARY: 264 mg/dL — AB (ref 70–99)
Glucose-Capillary: 144 mg/dL — ABNORMAL HIGH (ref 70–99)
Glucose-Capillary: 153 mg/dL — ABNORMAL HIGH (ref 70–99)
Glucose-Capillary: 194 mg/dL — ABNORMAL HIGH (ref 70–99)
Glucose-Capillary: 90 mg/dL (ref 70–99)

## 2014-10-05 LAB — CBC
HCT: 30.1 % — ABNORMAL LOW (ref 36.0–46.0)
Hemoglobin: 10.3 g/dL — ABNORMAL LOW (ref 12.0–15.0)
MCH: 25.8 pg — AB (ref 26.0–34.0)
MCHC: 34.2 g/dL (ref 30.0–36.0)
MCV: 75.3 fL — AB (ref 78.0–100.0)
PLATELETS: 57 10*3/uL — AB (ref 150–400)
RBC: 4 MIL/uL (ref 3.87–5.11)
RDW: 16.7 % — ABNORMAL HIGH (ref 11.5–15.5)
WBC: 19.9 10*3/uL — ABNORMAL HIGH (ref 4.0–10.5)

## 2014-10-05 LAB — POCT I-STAT 3, ART BLOOD GAS (G3+)
ACID-BASE DEFICIT: 4 mmol/L — AB (ref 0.0–2.0)
Bicarbonate: 22 mEq/L (ref 20.0–24.0)
O2 SAT: 94 %
PCO2 ART: 42.6 mmHg (ref 35.0–45.0)
PO2 ART: 74 mmHg — AB (ref 80.0–100.0)
Patient temperature: 97.5
TCO2: 23 mmol/L (ref 0–100)
pH, Arterial: 7.318 — ABNORMAL LOW (ref 7.350–7.450)

## 2014-10-05 LAB — MAGNESIUM: Magnesium: 2 mg/dL (ref 1.5–2.5)

## 2014-10-05 MED ORDER — POLYETHYLENE GLYCOL 3350 17 G PO PACK
17.0000 g | PACK | Freq: Every day | ORAL | Status: DC
  Filled 2014-10-05: qty 1

## 2014-10-05 MED ORDER — DOCUSATE SODIUM 100 MG PO CAPS
100.0000 mg | ORAL_CAPSULE | Freq: Every day | ORAL | Status: DC
  Filled 2014-10-05: qty 1

## 2014-10-05 MED ORDER — DOCUSATE SODIUM 50 MG/5ML PO LIQD
50.0000 mg | Freq: Every day | ORAL | Status: DC
  Administered 2014-10-05 – 2014-10-11 (×6): 50 mg
  Filled 2014-10-05 (×8): qty 10

## 2014-10-05 MED ORDER — POLYETHYLENE GLYCOL 3350 17 G PO PACK
17.0000 g | PACK | Freq: Every day | ORAL | Status: DC
  Administered 2014-10-05 – 2014-10-11 (×6): 17 g
  Filled 2014-10-05 (×8): qty 1

## 2014-10-05 NOTE — Progress Notes (Signed)
Patient ID: [name], female   DOB: 03-31-1951, 63 y.o.   MRN: [id number]  [facility] Progress Note   Assessment/ Plan:   1. Acute renal injury-oliguric: Anuric and now on CRRT support. Continue CRRT at current prescription with UF of 50-16800mL/hr as tolerated by her hypotension (remains on neosynephrin and vasopressin). 2. Metabolic acidosis: likely secondary to sepsis plus/minus DKA/metformin use. Improving on CRRT. 3. Altered mental status: encephalopathy suspected to be from sepsis versus diabetic ketoacidosis. ? Impact of PEA down time and hypoxic encephalopathy 4. Aspiration pneumonia/sepsis/MSSA bacteremia: on Ancef and being followed by ID. 5. Right hand ischemia:improving right hand perfusion-vascular surgery following. Remains on pressors and right arm protected.  Subjective:   No changes overnight   Objective:   BP 97/67 mmHg  Pulse 83  Temp(Src) 97.5 F (36.4 C) (Oral)  Resp 31  Ht 5\' 4"  (1.626 m)  Wt 84.1 kg (185 lb 6.5 oz)  BMI 31.81 kg/m2  SpO2 99%  Intake/Output Summary (Last 24 hours) at 10/05/14 0751 Last data filed at 10/05/14 0700  Gross per 24 hour  Intake 2740.85 ml  Output   3014 ml  Net -273.15 ml   Weight change: -1.2 kg (-2 lb 10.3 oz)  Physical Exam: NFA:OZHYQMVHQGen:intubated, eyes open but unresponsive ION:GEXBMCVS:Pulse RRR, normal S1 and S2 Resp:Coarse bilaterally WUX:LKGMAbd:soft, obese, NT, BS normal Ext:2+ anasarca  Imaging: [place] Renal  10/04/2014   CLINICAL DATA:  63 year old female with acute renal injury. Initial encounter. Current history of recent cardiac arrest.  EXAM: RENAL/URINARY TRACT ULTRASOUND COMPLETE  COMPARISON:  Non contrast CT Abdomen and Pelvis 10/01/2014.  FINDINGS: Right Kidney:  Length: 12.5 cm. Echogenicity at the upper limits of normal. No mass or hydronephrosis visualized.  Left Kidney:  Length: 12.9 cm. Echogenicity within normal limits. No mass or hydronephrosis visualized.  Bladder:  Not visualized, reportedly Foley  catheter is in place.  Other findings: Small volume ascites re- identified. Small right pleural effusion re - identified.  IMPRESSION: 1. No acute renal findings. 2. Small volume ascites and small right pleural effusion re- identified.   Electronically Signed   By: Augusto GambleLee  Hall M.D.   On: 10/04/2014 09:00   Dg Chest Port 1 View  10/04/2014   CLINICAL DATA:  Central line placement  EXAM: PORTABLE CHEST - 1 VIEW  COMPARISON:  10/04/2014  FINDINGS: Left jugular central venous catheter with the tip projecting over the SVC. Right-sided PICC line with the tip projecting over the SVC. Nasogastric tube with the tip projecting over the stomach. Endotracheal tube with the tip 3.3 cm above the carina.  Bilateral mild interstitial and patchy alveolar airspace opacities. No pneumothorax. Stable cardiomediastinal silhouette. Unremarkable osseous structures.  IMPRESSION: 1. Interval placement of a left jugular central venous catheter with the tip projecting over the SVC. 2. Bilateral interstitial and alveolar airspace opacities which may reflect mild pulmonary edema versus multi lobar pneumonia.   Electronically Signed   By: Elige KoHetal  Kacy Hegna   On: 10/04/2014 14:13   Dg Chest Port 1 View  10/04/2014   CLINICAL DATA:  Pneumonia; history of cardiac arrest diabetic ketoacidosis, septic shock  EXAM: PORTABLE CHEST - 1 VIEW  COMPARISON:  Portable chest x-ray of October 02, 2014  FINDINGS: The lungs are slightly less well inflated today. The pulmonary interstitial markings are increased and are slightly more confluent. Confluent density peripherally in the left upper lobe is consistent with developing pneumonia. There is partial obscuration of the left hemidiaphragm.  The cardiac silhouette is top-normal  in size. The central pulmonary vascularity is mildly engorged. The endotracheal tube tip lies 2.7 cm above the crotch of the carina. The esophagogastric tube tip and proximal port project below the left hemidiaphragm. The right  subclavian venous catheter tip projects over the distal SVC.  IMPRESSION: Interval worsening in the appearance of the pulmonary interstitium is in part due to hypoinflation but worsening interstitial edema bilaterally and developing left upper lobe pneumonia or atelectasis is demonstrated. The support tubes and lines are in appropriate position.   Electronically Signed   By: David  [place]   On: 10/04/2014 07:51    Labs: BMET  Recent Labs Lab 10/01/14 1649  10/02/14 0200  10/02/14 1430 10/02/14 1800 10/02/14 2205 10/03/14 0412 10/04/14 0330 10/04/14 1600 10/05/14 0400  NA 140  < > 137  < > 132* 137 133* 133* 128* 129* 130*  K 3.5*  < > 2.6*  < > 3.5* 3.8 3.9 4.1 4.3 4.6 3.9  CL 108  < > 108  < > 100 104 102 102 98 97 97  CO2 11*  < > 14*  < > 17* 16* 18* 17* 15* 17* 19  GLUCOSE 474*  < > 419*  < > 357* 251* 208* 121* 228* 147* 228*  BUN 27*  < > 30*  < > 33* 35* 35* 36* 43* 45* 37*  CREATININE 1.34*  < > 1.85*  < > 2.24* 2.33* 2.60* 2.72* 3.22* 3.16* 2.45*  CALCIUM 7.4*  < > 7.9*  < > 8.0* 8.2* 8.3* 8.2* 8.1* 8.2* 8.1*  PHOS 2.4  --  <0.5*  --   --   --   --  1.9* 3.0 3.1 2.1*  < > = values in this interval not displayed. CBC  Recent Labs Lab 10/01/14 1620 10/02/14 0200 10/03/14 0703 10/04/14 0330 10/05/14 0400  WBC 6.9 7.6 25.6* 26.7* 19.9*  NEUTROABS 6.1  --   --   --   --   HGB 13.6 11.7* 11.8* 10.9* 10.3*  HCT 40.8 34.7* 35.2* 31.9* 30.1*  MCV 81.1 78.0 78.7 78.0 75.3*  PLT 252 152 83* 71* 57*    Medications:    . antiseptic oral rinse  7 mL Mouth Rinse QID  .  ceFAZolin (ANCEF) IV  2 g Intravenous Q12H  . chlorhexidine  15 mL Mouth Rinse BID  . heparin  5,000 Units Subcutaneous 3 times per day  . insulin aspart  0-15 Units Subcutaneous 6 times per day  . insulin glargine  10 Units Subcutaneous Daily  . ipratropium-albuterol  3 mL Nebulization Q6H  . pantoprazole (PROTONIX) IV  40 mg Intravenous Q24H   Zetta BillsJay Nadiya Pieratt, MD 10/05/2014, 7:51 AM

## 2014-10-05 NOTE — Progress Notes (Signed)
PULMONARY / CRITICAL CARE MEDICINE   Name: [name] MRN: [id number] DOB: 1951/05/12    ADMISSION DATE:  10/01/2014 CONSULTATION DATE:  10/01/2014  REFERRING MD :  EDP  CHIEF COMPLAINT:  DKA, respiratory failure and AMS  INITIAL PRESENTATION: 63 year old female with PMH of diabetes and medical non-compliance who was last seen normal the day PTP at 8 PM.  Husband woke up this AM and was unable to arouse patient.  Evidently both have been "fighting a cold" but patient was coughing more the night before.  EMS was called and patient was brought to the ED.  In the ED patient became unresponsive and was intubated by EDP.  PCCM was called to admit.  STUDIES:  11/13 CXR with aspiration PNA and abdominal x-ray ? dentures in her lower abdomen. 11/13 CT abd/pelvis, no foreign body noted ? Overlap of bowel walls, neg for obstruction  11/14 Echo >EF 35-40%, small pericardial effusion  11/15 Arterial doppler RUE occlusion, per vasc 11/16- renal US, ascites, no hydro  SIGNIFICANT EVENTS: 11/13 Presenting with AMS, DKA and VDRF 11/16- remeain in shock, renal failure 11/16- cvvhd  SUBJECTIVE: neo and vaso still required  VITAL SIGNS: Temp:  [95.9 F (35.5 C)-98.3 F (36.8 C)] 97.5 F (36.4 C) (11/17 0750) Pulse Rate:  [25-106] 92 (11/17 1000) Resp:  [9-35] 30 (11/17 1000) BP: (80-149)/(30-112) 91/55 mmHg (11/17 1000) SpO2:  [80 %-100 %] 99 % (11/17 1000) FiO2 (%):  [40 %] 40 % (11/17 1000) Weight:  [84.1 kg (185 lb 6.5 oz)] 84.1 kg (185 lb 6.5 oz) (11/17 0425) HEMODYNAMICS: CVP:  [0 mmHg-12 mmHg] 0 mmHg VENTILATOR SETTINGS: Vent Mode:  [-] PRVC FiO2 (%):  [40 %] 40 % Set Rate:  [35 bmp] 35 bmp Vt Set:  [440 mL] 440 mL PEEP:  [5 cmH20] 5 cmH20 Plateau Pressure:  [22 cmH20-27 cmH20] 24 cmH20 INTAKE / OUTPUT:  Intake/Output Summary (Last 24 hours) at 10/05/14 1054 Last data filed at 10/05/14 1000  Gross per 24 hour  Intake 2957.94 ml  Output   3637 ml  Net -679.06 ml     PHYSICAL EXAMINATION: General:  Chronically ill appearing female who appears older than stated age, Neuro:  rass 0, alert, no following commands HEENT:  jvd up still Cardiovascular:  Regular, tachy, Nl S1/S2, -M/R/G. Lungs: ronchi reduced Abdomen:  Soft, NT, ND and +BS. Musculoskeletal:  3 +gen edema  RUE >hand mottled resolving , cool to touch , doppler pulse difficult Skin:  Thin but intact.  LABS:  CBC  Recent Labs Lab 10/03/14 0703 10/04/14 0330 10/05/14 0400  WBC 25.6* 26.7* 19.9*  HGB 11.8* 10.9* 10.3*  HCT 35.2* 31.9* 30.1*  PLT 83* 71* 57*   Coag's  Recent Labs Lab 10/04/14 1054  INR 1.15   BMET  Recent Labs Lab 10/04/14 0330 10/04/14 1600 10/05/14 0400  NA 128* 129* 130*  K 4.3 4.6 3.9  CL 98 97 97  CO2 15* 17* 19  BUN 43* 45* 37*  CREATININE 3.22* 3.16* 2.45*  GLUCOSE 228* 147* 228*   Electrolytes  Recent Labs Lab 10/02/14 0200  10/03/14 0412 10/04/14 0330 10/04/14 1600 10/05/14 0400  CALCIUM 7.9*  < > 8.2* 8.1* 8.2* 8.1*  MG 1.8  --  1.8  --   --  2.0  PHOS <0.5*  --  1.9* 3.0 3.1 2.1*  < > = values in this interval not displayed. Sepsis Markers  Recent Labs Lab 10/01/14 1007 10/01/14 1650 10/03/14 0421  LATICACIDVEN 1.28 1.6 1.9   ABG  Recent Labs Lab 10/03/14 1205 10/03/14 2147 10/04/14 1059  PHART 7.171* 7.216* 7.251*  PCO2ART 45.4* 36.8 36.1  PO2ART 80.0 68.0* 84.0   Liver Enzymes  Recent Labs Lab 10/01/14 0950 10/04/14 0330 10/04/14 1600 10/05/14 0400  AST 37 34  --   --   ALT 14 40*  --   --   ALKPHOS 121* 112  --   --   BILITOT 0.4 0.2*  --   --   ALBUMIN 2.4* 1.3* 1.4* 1.4*   Cardiac Enzymes  Recent Labs Lab 10/01/14 0950 10/01/14 1649 10/02/14 0001  TROPONINI  --  <0.30 <0.30  PROBNP 2023.0*  --   --    Glucose  Recent Labs Lab 10/04/14 1505 10/04/14 1609 10/04/14 2012 10/04/14 2358 10/05/14 0358 10/05/14 0749  GLUCAP 117* 126* 237* 264* 211* 153*    Imaging US  Renal  10/04/2014   CLINICAL DATA:  63 year old female with acute renal injury. Initial encounter. Current history of recent cardiac arrest.  EXAM: RENAL/URINARY TRACT ULTRASOUND COMPLETE  COMPARISON:  Non contrast CT Abdomen and Pelvis 10/01/2014.  FINDINGS: Right Kidney:  Length: 12.5 cm. Echogenicity at the upper limits of normal. No mass or hydronephrosis visualized.  Left Kidney:  Length: 12.9 cm. Echogenicity within normal limits. No mass or hydronephrosis visualized.  Bladder:  Not visualized, reportedly Foley catheter is in place.  Other findings: Small volume ascites re- identified. Small right pleural effusion re - identified.  IMPRESSION: 1. No acute renal findings. 2. Small volume ascites and small right pleural effusion re- identified.   Electronically Signed   By: Lars Pinks M.D.   On: 10/04/2014 09:00   Dg Chest Port 1 View  10/04/2014   CLINICAL DATA:  Central line placement  EXAM: PORTABLE CHEST - 1 VIEW  COMPARISON:  10/04/2014  FINDINGS: Left jugular central venous catheter with the tip projecting over the SVC. Right-sided PICC line with the tip projecting over the SVC. Nasogastric tube with the tip projecting over the stomach. Endotracheal tube with the tip 3.3 cm above the carina.  Bilateral mild interstitial and patchy alveolar airspace opacities. No pneumothorax. Stable cardiomediastinal silhouette. Unremarkable osseous structures.  IMPRESSION: 1. Interval placement of a left jugular central venous catheter with the tip projecting over the SVC. 2. Bilateral interstitial and alveolar airspace opacities which may reflect mild pulmonary edema versus multi lobar pneumonia.   Electronically Signed   By: Kathreen Devoid   On: 10/04/2014 14:13   Dg Chest Port 1 View  10/04/2014   CLINICAL DATA:  Pneumonia; history of cardiac arrest diabetic ketoacidosis, septic shock  EXAM: PORTABLE CHEST - 1 VIEW  COMPARISON:  Portable chest x-ray of October 02, 2014  FINDINGS: The lungs are slightly less  well inflated today. The pulmonary interstitial markings are increased and are slightly more confluent. Confluent density peripherally in the left upper lobe is consistent with developing pneumonia. There is partial obscuration of the left hemidiaphragm.  The cardiac silhouette is top-normal in size. The central pulmonary vascularity is mildly engorged. The endotracheal tube tip lies 2.7 cm above the crotch of the carina. The esophagogastric tube tip and proximal port project below the left hemidiaphragm. The right subclavian venous catheter tip projects over the distal SVC.  IMPRESSION: Interval worsening in the appearance of the pulmonary interstitium is in part due to hypoinflation but worsening interstitial edema bilaterally and developing left upper lobe pneumonia or atelectasis is demonstrated. The support tubes and  lines are in appropriate position.   Electronically Signed   By: David  [place]   On: 10/04/2014 07:51     ASSESSMENT / PLAN:  PULMONARY OETT 11/13>>> A: Acute resp failure, uncompensated met acidosis PNA  P:   -ABg reviewed, rate was maxed 11/16, repeat abg today, as we hope to be correcting met acidosis No Weaning with prior sbt - F/U ABG in 1 hr - F/U CXR in am  -BD   CARDIOVASCULAR CVL L [place] TLC 11/13>>> A: Hypotension likely related to acidosis and sepsis. Neg troponins .  CHF >echo 11/14 w/ decreased EF  11/15>max pressors unable to wean, CVP 13  RUE/Hand with mottling  P:  - cvp 12, for neg balance as able to tolerate -follow pressor needs with above approach - titrate pressors for MAP >60 -Vascular consult following  RENAL A:  Severe metabolic acidosis. Acute renal failure w/ rising scr and decreased UOP  Hypomagnesium  Hypophosatemia  Hypokalemia   P:   -cvvhd planned -chem in am   GASTROINTESTINAL A:  ? Swallowed dentures>not seen on CT abd/pelvis>>>neg on CT P: -  - NGT. -TF to goal -assess for BM, add colace, mrylax   HEMATOLOGIC A:   Anemia, thrombocytopenia (sepsis), r/o HITT type 1   P:  -dc heparin products (4 days not a picture of hitt) -scd -cbc in am  INFECTIOUS A:  Likely aspiration PNA. Sepsis  MSSA bacteremia P:   BCx2 11/13>>>1/2 staph aureus > UC 11/13>>> Sputum 11/13>>>GPC > Abx: Cefepime, start date 11/13>>> Vanc 11/14 > 11/16  Per ID Ancef 11/16>>>  Role TEE, consider, remains in shock  ENDOCRINE A:  DM and DKA>BS tr down on Insulin drip  Cortisol high 11/13  P:   - lantus, ssi - CBGs.  NEUROLOGIC A:  AMS due to DKA and acidosis, will clarify if arrest in ED then would concern anoxia P:   RASS goal: 0 - PRN versed and fentanyl drip -WUA -eeg assessment for subclinical seizure -portable head CT  FAMILY  Husband updated in full 11/16 - Updates: sister, long time significant other updated at bedside at length by me on 11/15   TODAY'S SUMMARY: remains on pressors, consider tee, ancef, for head CT,eeg, not waking up, anoxia?, pressors still required, neeeds abg  Ccm time 30 min   Lavon Paganini. Titus Mould, MD, [place] Pgr: [facility]

## 2014-10-05 NOTE — Procedures (Signed)
ELECTROENCEPHALOGRAM REPORT  Patient: [name]       Room #: 1O102M06 EEG No. ID: [id number] Age: 63 y.o.        Sex: female Referring Physician: Claudette HeadYACOUB, W Report Date:  10/05/2014        Interpreting Physician: Aline BrochureSTEWART,Saahas Hidrogo R  History: Abigail RutherfordDeborah A Zamora is an 63 y.o. female with acute encephalopathy and respiratory failure, as well as concern for possible hypoxic encephalopathy.  Indications for study:  Assessment very of encephalopathy; rule out seizure activity.  Technique: This is an 18 channel routine scalp EEG performed at the bedside with bipolar and monopolar montages arranged in accordance to the international 10/20 system of electrode placement.   Description: Patient was intubated and on mechanical ventilation at the time of this study. Background activity consisted of low to moderate amplitude diffuse symmetrical mixed delta and theta activity. Continuous muscle artifact was recorded throughout the study involving frontal and temporal regions. Photic stimulation was not performed. No epileptiform discharges were recorded.  Interpretation: This EEG was abnormal with moderately severe generalized continuous nonspecific slowing of activity. This pattern of slowing can be seen with metabolic as well as toxic and degenerative encephalopathies no evidence of epileptic activity was seen.   Venetia MaxonR Alezander Dimaano M.D. [facility] [phone]

## 2014-10-05 NOTE — Progress Notes (Signed)
EEG completed; results pending.    

## 2014-10-05 NOTE — Consult Note (Signed)
Right first 3 digits still dusky Still on Neo Vasopressin No real change  No surgical intervention planned at present as she is critically ill still requiring significant pressors Supportive care protect hand avoid further trauma Hopefully hand will heal as pressors weaned  Abigail Brunsharles Keland Peyton, MD [facility] [place] Office: [phone] Pager: [phone]

## 2014-10-05 NOTE — Progress Notes (Addendum)
[facility]    Date of Admission:  10/01/2014   Total days of antibiotics 5        Day 2 cefazolin                   ID: [name] is a 63 y.o. female with poorly controled DM with last A1C of 12 brought in to ED after being found unresponsive. Had previous URI symptoms. Patient intubated in the field and admitted to ICU. Started initially on broad spectrum antibiotics and requiring pressor support. Acidodic with initial pH of 6.8. Remains having pressor needs, multi organ dysfunction Active Problems:   DKA (diabetic ketoacidoses)   Cardiac arrest   Foreign body alimentary tract   Septic shock   Encephalopathy acute   Acute respiratory failure, unspecified whether with hypoxia or hypercapnia   AKI (acute kidney injury)    Subjective: Afebrile, but continues to have pressor needs. On day 2 of CRRT, remains encephalopathic  Medications:  . antiseptic oral rinse  7 mL Mouth Rinse QID  .  ceFAZolin (ANCEF) IV  2 g Intravenous Q12H  . chlorhexidine  15 mL Mouth Rinse BID  . docusate  50 mg Per Tube Daily  . insulin aspart  0-15 Units Subcutaneous 6 times per day  . insulin glargine  10 Units Subcutaneous Daily  . ipratropium-albuterol  3 mL Nebulization Q6H  . pantoprazole (PROTONIX) IV  40 mg Intravenous Q24H  . polyethylene glycol  17 g Per Tube Daily    Objective: Vital signs in last 24 hours: Temp:  [95.9 F (35.5 C)-98.3 F (36.8 C)] 97.2 F (36.2 C) (11/17 1146) Pulse Rate:  [25-106] 85 (11/17 1100) Resp:  [9-35] 25 (11/17 1100) BP: (80-132)/(30-76) 93/68 mmHg (11/17 1100) SpO2:  [80 %-100 %] 99 % (11/17 1100) FiO2 (%):  [40 %] 40 % (11/17 1100) Weight:  [185 lb 6.5 oz (84.1 kg)] 185 lb 6.5 oz (84.1 kg) (11/17 0425) Physical Exam  Constitutional:  Chronically ill, older than stated age, unresponsive to verbal stimuli HENT: eyes are open  Mouth/Throat: ETT in place Cardiovascular: tachycardic regular rhythm and normal  heart sounds. Exam reveals no gallop and no friction rub.  No murmur heard.  Pulmonary/Chest: course BS bilateteraly Neurological: unresponsive Skin: cool extremities, cyanotic on right hand   Lab Results  Recent Labs  10/04/14 0330 10/04/14 1600 10/05/14 0400  WBC 26.7*  --  19.9*  HGB 10.9*  --  10.3*  HCT 31.9*  --  30.1*  NA 128* 129* 130*  K 4.3 4.6 3.9  CL 98 97 97  CO2 15* 17* 19  BUN 43* 45* 37*  CREATININE 3.22* 3.16* 2.45*   Liver Panel  Recent Labs  10/04/14 0330 10/04/14 1600 10/05/14 0400  PROT 4.9*  --   --   ALBUMIN 1.3* 1.4* 1.4*  AST 34  --   --   ALT 40*  --   --   ALKPHOS 112  --   --   BILITOT 0.2*  --   --    Sedimentation Rate No results for input(s): ESRSEDRATE in the last 72 hours. C-Reactive Protein No results for input(s): CRP in the last 72 hours.  Microbiology: 11/14 urine cx NGTD 11/13 blood cx 1 of 2 blood cx MSSA 11/13 urine cx NGTD 11/13 endotrachael specimen cx b-hemolytic strep and MSSA  Studies/Results: [place]  10/04/2014   CLINICAL DATA:  63 year old female with acute renal injury. Initial  encounter. Current history of recent cardiac arrest.  EXAM: RENAL/URINARY TRACT ULTRASOUND COMPLETE  COMPARISON:  Non contrast CT Abdomen and Pelvis 10/01/2014.  FINDINGS: Right Kidney:  Length: 12.5 cm. Echogenicity at the upper limits of normal. No mass or hydronephrosis visualized.  Left Kidney:  Length: 12.9 cm. Echogenicity within normal limits. No mass or hydronephrosis visualized.  Bladder:  Not visualized, reportedly Foley catheter is in place.  Other findings: Small volume ascites re- identified. Small right pleural effusion re - identified.  IMPRESSION: 1. No acute renal findings. 2. Small volume ascites and small right pleural effusion re- identified.   Electronically Signed   By: Augusto GambleLee  Hall M.D.   On: 10/04/2014 09:00   Dg Chest Port 1 View  10/04/2014   CLINICAL DATA:  Central line placement  EXAM: PORTABLE CHEST - 1 VIEW   COMPARISON:  10/04/2014  FINDINGS: Left jugular central venous catheter with the tip projecting over the SVC. Right-sided PICC line with the tip projecting over the SVC. Nasogastric tube with the tip projecting over the stomach. Endotracheal tube with the tip 3.3 cm above the carina.  Bilateral mild interstitial and patchy alveolar airspace opacities. No pneumothorax. Stable cardiomediastinal silhouette. Unremarkable osseous structures.  IMPRESSION: 1. Interval placement of a left jugular central venous catheter with the tip projecting over the SVC. 2. Bilateral interstitial and alveolar airspace opacities which may reflect mild pulmonary edema versus multi lobar pneumonia.   Electronically Signed   By: Elige KoHetal  Patel   On: 10/04/2014 14:13   Dg Chest Port 1 View  10/04/2014   CLINICAL DATA:  Pneumonia; history of cardiac arrest diabetic ketoacidosis, septic shock  EXAM: PORTABLE CHEST - 1 VIEW  COMPARISON:  Portable chest x-ray of October 02, 2014  FINDINGS: The lungs are slightly less well inflated today. The pulmonary interstitial markings are increased and are slightly more confluent. Confluent density peripherally in the left upper lobe is consistent with developing pneumonia. There is partial obscuration of the left hemidiaphragm.  The cardiac silhouette is top-normal in size. The central pulmonary vascularity is mildly engorged. The endotracheal tube tip lies 2.7 cm above the crotch of the carina. The esophagogastric tube tip and proximal port project below the left hemidiaphragm. The right subclavian venous catheter tip projects over the distal SVC.  IMPRESSION: Interval worsening in the appearance of the pulmonary interstitium is in part due to hypoinflation but worsening interstitial edema bilaterally and developing left upper lobe pneumonia or atelectasis is demonstrated. The support tubes and lines are in appropriate position.   Electronically Signed   By: David  [place]   On: 10/04/2014 07:51   Ct  Portable Head W/o Cm  10/05/2014   CLINICAL DATA:  Anoxic brain injury.  EXAM: CT HEAD WITHOUT CONTRAST  TECHNIQUE: Contiguous axial images were obtained from the base of the skull through the vertex without intravenous contrast.  COMPARISON:  CT scan of October 01, 2014.  FINDINGS: Bony calvarium appears intact. Left maxillary sinusitis is noted. No mass effect or midline shift is noted. Ventricular size is within normal limits. There is no evidence of mass lesion, hemorrhage or acute infarction.  IMPRESSION: Left maxillary sinusitis.  No acute intracranial abnormality seen.   Electronically Signed   By: Roque LiasJames  Green M.D.   On: 10/05/2014 14:24     Assessment/Plan: MSSA bacteremia/sepsis = continue on cefazolin, agree with getting nchct and TEE to evaluate if she has metastatic infection. Agree that she may need further imaging, chest/abd/pelvis CT if still not improving  tomorrow  Pneumonia = will continue on cefazolin  aki = followed by renal and primary team for CRRT  Thrombocytopenia = concern that it maybe either heparin vs. Infection related.   Drue Second [facility] Cell: [phone] Pager: [phone]  10/05/2014, 3:01 PM

## 2014-10-06 ENCOUNTER — Inpatient Hospital Stay (HOSPITAL_COMMUNITY): Payer: Medicare Other

## 2014-10-06 DIAGNOSIS — J189 Pneumonia, unspecified organism: Secondary | ICD-10-CM | POA: Insufficient documentation

## 2014-10-06 DIAGNOSIS — R401 Stupor: Secondary | ICD-10-CM

## 2014-10-06 DIAGNOSIS — N179 Acute kidney failure, unspecified: Secondary | ICD-10-CM

## 2014-10-06 LAB — POCT I-STAT EG7
ACID-BASE EXCESS: 1 mmol/L (ref 0.0–2.0)
ACID-BASE EXCESS: 4 mmol/L — AB (ref 0.0–2.0)
Acid-Base Excess: 2 mmol/L (ref 0.0–2.0)
Acid-Base Excess: 2 mmol/L (ref 0.0–2.0)
Acid-Base Excess: 3 mmol/L — ABNORMAL HIGH (ref 0.0–2.0)
BICARBONATE: 25.9 meq/L — AB (ref 20.0–24.0)
BICARBONATE: 26.6 meq/L — AB (ref 20.0–24.0)
BICARBONATE: 27.5 meq/L — AB (ref 20.0–24.0)
Bicarbonate: 25.8 mEq/L — ABNORMAL HIGH (ref 20.0–24.0)
Bicarbonate: 27 mEq/L — ABNORMAL HIGH (ref 20.0–24.0)
Bicarbonate: 27.3 mEq/L — ABNORMAL HIGH (ref 20.0–24.0)
Bicarbonate: 29.1 mEq/L — ABNORMAL HIGH (ref 20.0–24.0)
CALCIUM ION: 0.65 mmol/L — AB (ref 1.13–1.30)
CALCIUM ION: 1.13 mmol/L (ref 1.13–1.30)
CALCIUM ION: 0.6 mmol/L — AB (ref 1.13–1.30)
CALCIUM ION: 0.69 mmol/L — AB (ref 1.13–1.30)
Calcium, Ion: 1.1 mmol/L — ABNORMAL LOW (ref 1.13–1.30)
Calcium, Ion: 0.62 mmol/L — CL (ref 1.13–1.30)
Calcium, Ion: 1.13 mmol/L (ref 1.13–1.30)
HCT: 29 % — ABNORMAL LOW (ref 36.0–46.0)
HCT: 29 % — ABNORMAL LOW (ref 36.0–46.0)
HCT: 29 % — ABNORMAL LOW (ref 36.0–46.0)
HEMATOCRIT: 31 % — AB (ref 36.0–46.0)
HEMATOCRIT: 29 % — AB (ref 36.0–46.0)
HEMATOCRIT: 30 % — AB (ref 36.0–46.0)
HEMATOCRIT: 29 % — AB (ref 36.0–46.0)
HEMOGLOBIN: 9.9 g/dL — AB (ref 12.0–15.0)
HEMOGLOBIN: 10.2 g/dL — AB (ref 12.0–15.0)
HEMOGLOBIN: 9.9 g/dL — AB (ref 12.0–15.0)
Hemoglobin: 10.5 g/dL — ABNORMAL LOW (ref 12.0–15.0)
Hemoglobin: 9.9 g/dL — ABNORMAL LOW (ref 12.0–15.0)
Hemoglobin: 9.9 g/dL — ABNORMAL LOW (ref 12.0–15.0)
Hemoglobin: 9.9 g/dL — ABNORMAL LOW (ref 12.0–15.0)
O2 SAT: 69 %
O2 Saturation: 68 %
O2 Saturation: 62 %
O2 Saturation: 62 %
O2 Saturation: 60 %
O2 Saturation: 61 %
O2 Saturation: 71 %
PCO2 VEN: 41.7 mmHg — AB (ref 45.0–50.0)
PCO2 VEN: 42.3 mmHg — AB (ref 45.0–50.0)
PH VEN: 7.367 — AB (ref 7.250–7.300)
PH VEN: 7.419 — AB (ref 7.250–7.300)
PO2 VEN: 35 mmHg (ref 30.0–45.0)
PO2 VEN: 32 mmHg (ref 30.0–45.0)
POTASSIUM: 3.7 meq/L (ref 3.7–5.3)
POTASSIUM: 3.5 meq/L — AB (ref 3.7–5.3)
POTASSIUM: 3.6 meq/L — AB (ref 3.7–5.3)
Patient temperature: 97.4
Patient temperature: 98.1
Patient temperature: 98.1
Patient temperature: 98.1
Patient temperature: 97.6
Potassium: 3.7 mEq/L (ref 3.7–5.3)
Potassium: 3.8 mEq/L (ref 3.7–5.3)
Potassium: 3.6 mEq/L — ABNORMAL LOW (ref 3.7–5.3)
Potassium: 3.6 mEq/L — ABNORMAL LOW (ref 3.7–5.3)
SODIUM: 134 meq/L — AB (ref 137–147)
SODIUM: 131 meq/L — AB (ref 137–147)
SODIUM: 131 meq/L — AB (ref 137–147)
Sodium: 132 mEq/L — ABNORMAL LOW (ref 137–147)
Sodium: 132 mEq/L — ABNORMAL LOW (ref 137–147)
Sodium: 134 mEq/L — ABNORMAL LOW (ref 137–147)
Sodium: 133 mEq/L — ABNORMAL LOW (ref 137–147)
TCO2: 27 mmol/L (ref 0–100)
TCO2: 27 mmol/L (ref 0–100)
TCO2: 28 mmol/L (ref 0–100)
TCO2: 28 mmol/L (ref 0–100)
TCO2: 29 mmol/L (ref 0–100)
TCO2: 29 mmol/L (ref 0–100)
TCO2: 30 mmol/L (ref 0–100)
pCO2, Ven: 44.8 mmHg — ABNORMAL LOW (ref 45.0–50.0)
pCO2, Ven: 45.7 mmHg (ref 45.0–50.0)
pCO2, Ven: 42.6 mmHg — ABNORMAL LOW (ref 45.0–50.0)
pCO2, Ven: 40.6 mmHg — ABNORMAL LOW (ref 45.0–50.0)
pCO2, Ven: 43.8 mmHg — ABNORMAL LOW (ref 45.0–50.0)
pH, Ven: 7.356 — ABNORMAL HIGH (ref 7.250–7.300)
pH, Ven: 7.402 — ABNORMAL HIGH (ref 7.250–7.300)
pH, Ven: 7.416 — ABNORMAL HIGH (ref 7.250–7.300)
pH, Ven: 7.437 — ABNORMAL HIGH (ref 7.250–7.300)
pH, Ven: 7.428 — ABNORMAL HIGH (ref 7.250–7.300)
pO2, Ven: 37 mmHg (ref 30.0–45.0)
pO2, Ven: 31 mmHg (ref 30.0–45.0)
pO2, Ven: 30 mmHg (ref 30.0–45.0)
pO2, Ven: 31 mmHg (ref 30.0–45.0)
pO2, Ven: 36 mmHg (ref 30.0–45.0)

## 2014-10-06 LAB — POCT I-STAT 7, (LYTES, BLD GAS, ICA,H+H)
Acid-Base Excess: 4 mmol/L — ABNORMAL HIGH (ref 0.0–2.0)
Acid-Base Excess: 5 mmol/L — ABNORMAL HIGH (ref 0.0–2.0)
BICARBONATE: 28.4 meq/L — AB (ref 20.0–24.0)
Bicarbonate: 29.7 mEq/L — ABNORMAL HIGH (ref 20.0–24.0)
Calcium, Ion: 1.15 mmol/L (ref 1.13–1.30)
Calcium, Ion: 1.13 mmol/L (ref 1.13–1.30)
HCT: 27 % — ABNORMAL LOW (ref 36.0–46.0)
HEMATOCRIT: 23 % — AB (ref 36.0–46.0)
Hemoglobin: 7.8 g/dL — ABNORMAL LOW (ref 12.0–15.0)
Hemoglobin: 9.2 g/dL — ABNORMAL LOW (ref 12.0–15.0)
O2 SAT: 71 %
O2 Saturation: 73 %
PCO2 ART: 44.4 mmHg (ref 35.0–45.0)
PO2 ART: 35 mmHg — AB (ref 80.0–100.0)
Patient temperature: 97.6
Potassium: 3.5 mEq/L — ABNORMAL LOW (ref 3.7–5.3)
Potassium: 3.8 mEq/L (ref 3.7–5.3)
SODIUM: 130 meq/L — AB (ref 137–147)
Sodium: 130 mEq/L — ABNORMAL LOW (ref 137–147)
TCO2: 30 mmol/L (ref 0–100)
TCO2: 31 mmol/L (ref 0–100)
pCO2 arterial: 38.9 mmHg (ref 35.0–45.0)
pH, Arterial: 7.47 — ABNORMAL HIGH (ref 7.350–7.450)
pH, Arterial: 7.431 (ref 7.350–7.450)
pO2, Arterial: 35 mmHg — CL (ref 80.0–100.0)

## 2014-10-06 LAB — PROTIME-INR
INR: 1.13 (ref 0.00–1.49)
Prothrombin Time: 14.7 seconds (ref 11.6–15.2)

## 2014-10-06 LAB — GLUCOSE, CAPILLARY
GLUCOSE-CAPILLARY: 175 mg/dL — AB (ref 70–99)
Glucose-Capillary: 217 mg/dL — ABNORMAL HIGH (ref 70–99)
Glucose-Capillary: 164 mg/dL — ABNORMAL HIGH (ref 70–99)
Glucose-Capillary: 56 mg/dL — ABNORMAL LOW (ref 70–99)
Glucose-Capillary: 173 mg/dL — ABNORMAL HIGH (ref 70–99)
Glucose-Capillary: 156 mg/dL — ABNORMAL HIGH (ref 70–99)

## 2014-10-06 LAB — RENAL FUNCTION PANEL
ALBUMIN: 1.4 g/dL — AB (ref 3.5–5.2)
ANION GAP: 11 (ref 5–15)
ANION GAP: 13 (ref 5–15)
Albumin: 1.4 g/dL — ABNORMAL LOW (ref 3.5–5.2)
BUN: 26 mg/dL — AB (ref 6–23)
BUN: 23 mg/dL (ref 6–23)
CO2: 23 mEq/L (ref 19–32)
CO2: 27 mEq/L (ref 19–32)
Calcium: 7.7 mg/dL — ABNORMAL LOW (ref 8.4–10.5)
Calcium: 8.7 mg/dL (ref 8.4–10.5)
Chloride: 98 mEq/L (ref 96–112)
Chloride: 93 mEq/L — ABNORMAL LOW (ref 96–112)
Creatinine, Ser: 1.78 mg/dL — ABNORMAL HIGH (ref 0.50–1.10)
Creatinine, Ser: 1.55 mg/dL — ABNORMAL HIGH (ref 0.50–1.10)
GFR calc non Af Amer: 30 mL/min — ABNORMAL LOW (ref >90)
GFR, EST AFRICAN AMERICAN: 34 mL/min — AB (ref >90)
GFR, EST AFRICAN AMERICAN: 41 mL/min — AB (ref >90)
GFR, EST NON AFRICAN AMERICAN: 35 mL/min — AB (ref >90)
GLUCOSE: 220 mg/dL — AB (ref 70–99)
Glucose, Bld: 204 mg/dL — ABNORMAL HIGH (ref 70–99)
PHOSPHORUS: 1.5 mg/dL — AB (ref 2.3–4.6)
POTASSIUM: 4 meq/L (ref 3.7–5.3)
Phosphorus: 1.9 mg/dL — ABNORMAL LOW (ref 2.3–4.6)
Potassium: 4.5 mEq/L (ref 3.7–5.3)
SODIUM: 133 meq/L — AB (ref 137–147)
Sodium: 132 mEq/L — ABNORMAL LOW (ref 137–147)

## 2014-10-06 LAB — CBC
HCT: 30.9 % — ABNORMAL LOW (ref 36.0–46.0)
Hemoglobin: 10.3 g/dL — ABNORMAL LOW (ref 12.0–15.0)
MCH: 25.6 pg — ABNORMAL LOW (ref 26.0–34.0)
MCHC: 33.3 g/dL (ref 30.0–36.0)
MCV: 76.7 fL — ABNORMAL LOW (ref 78.0–100.0)
Platelets: 56 10*3/uL — ABNORMAL LOW (ref 150–400)
RBC: 4.03 MIL/uL (ref 3.87–5.11)
RDW: 16.7 % — ABNORMAL HIGH (ref 11.5–15.5)
WBC: 12.7 10*3/uL — ABNORMAL HIGH (ref 4.0–10.5)

## 2014-10-06 LAB — MAGNESIUM: Magnesium: 2.2 mg/dL (ref 1.5–2.5)

## 2014-10-06 MED ORDER — PRISMASOL BGK 4/2.5 32-4-2.5 MEQ/L IV SOLN
INTRAVENOUS | Status: DC
  Administered 2014-10-07: 18:00:00 via INTRAVENOUS_CENTRAL
  Filled 2014-10-06 (×3): qty 5000

## 2014-10-06 MED ORDER — IOHEXOL 300 MG/ML  SOLN
25.0000 mL | INTRAMUSCULAR | Status: AC
  Administered 2014-10-06 (×2): 25 mL via ORAL

## 2014-10-06 MED ORDER — DEXTROSE 5 % IV SOLN
20.0000 g | INTRAVENOUS | Status: DC
  Administered 2014-10-06: 19:00:00 via INTRAVENOUS_CENTRAL
  Administered 2014-10-06 – 2014-10-07 (×2): 20 g via INTRAVENOUS_CENTRAL
  Filled 2014-10-06 (×7): qty 200

## 2014-10-06 MED ORDER — ANTICOAGULANT SODIUM CITRATE 4% (200MG/5ML) IV SOLN
Status: DC
  Administered 2014-10-06 – 2014-10-08 (×7): via INTRAVENOUS_CENTRAL
  Filled 2014-10-06 (×12): qty 1500

## 2014-10-06 NOTE — Progress Notes (Signed)
RT note-CVP changed due to new line.

## 2014-10-06 NOTE — Progress Notes (Signed)
RT note- Patient transported to CT and returned to unit, remains on current ventilator settings.

## 2014-10-06 NOTE — Progress Notes (Signed)
Hypoglycemic Event  CBG: 56  Treatment:  1 amp d50  Symptoms: none  Follow-up CBG: [id number] CBG Result:164  Possible Reasons for Event:  lantus    Eilleen KempfMoore, Aubery Date Elizabeth  Remember to initiate Hypoglycemia Order Set & complete

## 2014-10-06 NOTE — Progress Notes (Signed)
Patient ID: Narda Rutherfordeborah A Splawn, female   DOB: 1951/02/28, 63 y.o.   MRN: [id number]  [facility] Progress Note   Assessment/ Plan:   1. Acute renal injury: This appears to be from ATN of sepsis +/- PEA arrest. Anuric and now on CRRT support. Will continue CRRT at current prescription with UF of 50-14900mL/hr as tolerated by her hypotension (remains on neosynephrin and vasopressin). Start anticoagulation to prolong filter life/reduce blood loss. With her marginal platelet counts and seemingly normal LFTs, will start on sodium citrate. 2. Metabolic acidosis: secondary to underlying sepsis/ketosis-corrected by CRRT 3. Altered mental status: seen by neurology to help evaluate for possible hypoxic/anoxic brain injury following PEA arrest, EEG shows evidence of metabolic encephalopathy. She appears to be localizing voice/following simple commands. 4. Aspiration pneumonia/sepsis/MSSA bacteremia: on Ancef and being followed by ID. 5. Right hand ischemia:improving right hand perfusion-vascular surgery following. Remains on pressors and right arm protected-no plans for surgical intervention.  Subjective:   No acute events overnight, intubated, remains on CRRT however seemingly clotting her filter every shift   Objective:   BP 87/57 mmHg  Pulse 116  Temp(Src) 98.4 F (36.9 C) (Oral)  Resp 35  Ht 5\' 4"  (1.626 m)  Wt 81.1 kg (178 lb 12.7 oz)  BMI 30.67 kg/m2  SpO2 100%  Intake/Output Summary (Last 24 hours) at 10/06/14 0746 Last data filed at 10/06/14 0700  Gross per 24 hour  Intake 3373.14 ml  Output   4720 ml  Net -1346.86 ml   Weight change: -3 kg (-6 lb 9.8 oz)  Physical Exam: WUJ:WJXBJYNWGGen:intubated, comfortable, turns head to voice NFA:OZHYQCVS:pulse regular tachycardia, S1 and S2 normal Resp:mechanical breath sounds bilaterally, no rales MVH:QIONAbd:soft, obese, nontender bowel sounds normal Ext:1-2+ edema of her upper and lower extremities.  Imaging: [place] Renal  10/04/2014   CLINICAL DATA:   63 year old female with acute renal injury. Initial encounter. Current history of recent cardiac arrest.  EXAM: RENAL/URINARY TRACT ULTRASOUND COMPLETE  COMPARISON:  Non contrast CT Abdomen and Pelvis 10/01/2014.  FINDINGS: Right Kidney:  Length: 12.5 cm. Echogenicity at the upper limits of normal. No mass or hydronephrosis visualized.  Left Kidney:  Length: 12.9 cm. Echogenicity within normal limits. No mass or hydronephrosis visualized.  Bladder:  Not visualized, reportedly Foley catheter is in place.  Other findings: Small volume ascites re- identified. Small right pleural effusion re - identified.  IMPRESSION: 1. No acute renal findings. 2. Small volume ascites and small right pleural effusion re- identified.   Electronically Signed   By: Augusto GambleLee  Hall M.D.   On: 10/04/2014 09:00   Dg Chest Port 1 View  10/06/2014   SHOULDER CC CLINICAL DATA: 63 year old female acute respiratory failure, intubated. Initial encounter.  EXAM: PORTABLE CHEST - 1 VIEW  COMPARISON:  10/04/2014 and earlier.  FINDINGS: Portable AP semi upright view at 0509 hrs. Endotracheal tube tip now at the level the clavicles. Stable enteric tube, dual lumen left IJ catheter and single lumen right subclavian catheter. Stable cardiac size and mediastinal contours.  Mildly larger lung volumes. Continued dense retrocardiac opacity. Superimposed scattered bilateral patchy pulmonary opacity has mildly regressed since 10/04/2014. No pneumothorax or large effusion.  IMPRESSION: 1.  Stable lines and tubes. 2. Lower lobe collapse or consolidation. 3. Superimposed bilateral patchy pulmonary opacity suspicious for bilateral pneumonia has mildly regressed since 10/04/2014.   Electronically Signed   By: Augusto GambleLee  Hall M.D.   On: 10/06/2014 06:38   Dg Chest Port 1 View  10/04/2014   CLINICAL DATA:  Central line placement  EXAM: PORTABLE CHEST - 1 VIEW  COMPARISON:  10/04/2014  FINDINGS: Left jugular central venous catheter with the tip projecting over the SVC.  Right-sided PICC line with the tip projecting over the SVC. Nasogastric tube with the tip projecting over the stomach. Endotracheal tube with the tip 3.3 cm above the carina.  Bilateral mild interstitial and patchy alveolar airspace opacities. No pneumothorax. Stable cardiomediastinal silhouette. Unremarkable osseous structures.  IMPRESSION: 1. Interval placement of a left jugular central venous catheter with the tip projecting over the SVC. 2. Bilateral interstitial and alveolar airspace opacities which may reflect mild pulmonary edema versus multi lobar pneumonia.   Electronically Signed   By: Elige KoHetal  Whit Bruni   On: 10/04/2014 14:13   Ct Portable Head W/o Cm  10/05/2014   CLINICAL DATA:  Anoxic brain injury.  EXAM: CT HEAD WITHOUT CONTRAST  TECHNIQUE: Contiguous axial images were obtained from the base of the skull through the vertex without intravenous contrast.  COMPARISON:  CT scan of October 01, 2014.  FINDINGS: Bony calvarium appears intact. Left maxillary sinusitis is noted. No mass effect or midline shift is noted. Ventricular size is within normal limits. There is no evidence of mass lesion, hemorrhage or acute infarction.  IMPRESSION: Left maxillary sinusitis.  No acute intracranial abnormality seen.   Electronically Signed   By: Roque LiasJames  Green M.D.   On: 10/05/2014 14:24    Labs: BMET  Recent Labs Lab 10/02/14 0200  10/02/14 2205 10/03/14 0412 10/04/14 0330 10/04/14 1600 10/05/14 0400 10/05/14 1600 10/06/14 0500  NA 137  < > 133* 133* 128* 129* 130* 132* 132*  K 2.6*  < > 3.9 4.1 4.3 4.6 3.9 3.8 4.5  CL 108  < > 102 102 98 97 97 97 98  CO2 14*  < > 18* 17* 15* 17* 19 20 23   GLUCOSE 419*  < > 208* 121* 228* 147* 228* 213* 220*  BUN 30*  < > 35* 36* 43* 45* 37* 33* 26*  CREATININE 1.85*  < > 2.60* 2.72* 3.22* 3.16* 2.45* 2.10* 1.78*  CALCIUM 7.9*  < > 8.3* 8.2* 8.1* 8.2* 8.1* 7.7* 7.7*  PHOS <0.5*  --   --  1.9* 3.0 3.1 2.1* 1.8* 1.9*  < > = values in this interval not  displayed. CBC  Recent Labs Lab 10/01/14 1620  10/03/14 0703 10/04/14 0330 10/05/14 0400 10/06/14 0605  WBC 6.9  < > 25.6* 26.7* 19.9* 12.7*  NEUTROABS 6.1  --   --   --   --   --   HGB 13.6  < > 11.8* 10.9* 10.3* 10.3*  HCT 40.8  < > 35.2* 31.9* 30.1* 30.9*  MCV 81.1  < > 78.7 78.0 75.3* 76.7*  PLT 252  < > 83* 71* 57* 56*  < > = values in this interval not displayed.  Medications:    . antiseptic oral rinse  7 mL Mouth Rinse QID  .  ceFAZolin (ANCEF) IV  2 g Intravenous Q12H  . chlorhexidine  15 mL Mouth Rinse BID  . docusate  50 mg Per Tube Daily  . insulin aspart  0-15 Units Subcutaneous 6 times per day  . insulin glargine  10 Units Subcutaneous Daily  . ipratropium-albuterol  3 mL Nebulization Q6H  . pantoprazole (PROTONIX) IV  40 mg Intravenous Q24H  . polyethylene glycol  17 g Per Tube Daily   Zetta BillsJay Kirbi Farrugia, MD 10/06/2014, 7:46 AM

## 2014-10-06 NOTE — Procedures (Signed)
Central Venous Catheter Insertion Procedure Note Narda RutherfordDeborah A Klas [id number] 1951-02-02  Procedure: Insertion of Central Venous Catheter Indications: Assessment of intravascular volume, Drug and/or fluid administration and Frequent blood sampling  Procedure Details Consent: Risks of procedure as well as the alternatives and risks of each were explained to the (patient/caregiver).  Consent for procedure obtained. Time Out: Verified patient identification, verified procedure, site/side was marked, verified correct patient position, special equipment/implants available, medications/allergies/relevent history reviewed, required imaging and test results available.  Performed Real time US was used to ID and cannulate vessel  Maximum sterile technique was used including antiseptics, cap, gloves, gown, hand hygiene, mask and sheet. Skin prep: Chlorhexidine; local anesthetic administered A antimicrobial bonded/coated triple lumen catheter was placed in the right internal jugular vein using the Seldinger technique.  Evaluation Blood flow good Complications: No apparent complications Patient did tolerate procedure well. Chest X-ray ordered to verify placement.  CXR: pending.  BABCOCK,PETE 10/06/2014, 1:10 PM   US guidance Continued pressors, to dc old line  Mcarthur RossettiDaniel J. [name], MD, FACP Pgr: [phone] [facility]

## 2014-10-06 NOTE — Progress Notes (Signed)
PULMONARY / CRITICAL CARE MEDICINE   Name: [name] MRN: [id number] DOB: 03-Apr-1951    ADMISSION DATE:  10/01/2014 CONSULTATION DATE:  10/01/2014  REFERRING MD :  EDP  CHIEF COMPLAINT:  DKA, respiratory failure and AMS  INITIAL PRESENTATION: 63 year old female with PMH of diabetes and medical non-compliance who was last seen normal the day PTP at 8 PM.  Husband woke up this AM and was unable to arouse patient.  Evidently both have been "fighting a cold" but patient was coughing more the night before.  EMS was called and patient was brought to the ED.  In the ED patient became unresponsive and was intubated by EDP.  PCCM was called to admit.  STUDIES:  11/13 CXR with aspiration PNA and abdominal x-ray ? dentures in her lower abdomen. 11/13 CT abd/pelvis, no foreign body noted ? Overlap of bowel walls, neg for obstruction  11/14 Echo >EF 35-40%, small pericardial effusion  11/15 Arterial doppler RUE occlusion, per vasc 11/16- renal US, ascites, no hydro 11/17 ct head>>>max sinusitis, no acute 11/18 ct abdo/pelvis>>> 11/17 eeg>>>gen slowing c/w met enceph  SIGNIFICANT EVENTS: 11/13 Presenting with AMS, DKA and VDRF 11/16- remeain in shock, renal failure 11/16- cvvhd  SUBJECTIVE: neo dose remains  VITAL SIGNS: Temp:  [97.2 F (36.2 C)-98.4 F (36.9 C)] 97.4 F (36.3 C) (11/18 0756) Pulse Rate:  [35-116] 103 (11/18 0900) Resp:  [0-35] 24 (11/18 1000) BP: (87-130)/(43-71) 127/65 mmHg (11/18 1000) SpO2:  [72 %-100 %] 100 % (11/18 0900) FiO2 (%):  [40 %] 40 % (11/18 0800) Weight:  [81.1 kg (178 lb 12.7 oz)] 81.1 kg (178 lb 12.7 oz) (11/18 0500) HEMODYNAMICS: CVP:  [12 mmHg-15 mmHg] 15 mmHg VENTILATOR SETTINGS: Vent Mode:  [-] PRVC FiO2 (%):  [40 %] 40 % Set Rate:  [35 bmp] 35 bmp Vt Set:  [440 mL] 440 mL PEEP:  [5 cmH20] 5 cmH20 Plateau Pressure:  [19 cmH20-22 cmH20] 20 cmH20 INTAKE / OUTPUT:  Intake/Output Summary (Last 24 hours) at 10/06/14 1051 Last data  filed at 10/06/14 1000  Gross per 24 hour  Intake   3367 ml  Output   4838 ml  Net  -1471 ml    PHYSICAL EXAMINATION: General:  Chronically ill appearing female who appears older than stated age Neuro:  rass 0, alert, following commands today for first time HEENT:  jvd flat Cardiovascular:  Regular, tachy, Nl S1/S2, -M/R/G. Lungs: ronchi diffuse Abdomen:  Soft, NT, ND and +BS. Musculoskeletal:  2 +gen edema  RUE >hand mottled resolving , cool to touch , doppler pulse difficult Skin:  Thin but intact.  LABS:  CBC  Recent Labs Lab 10/04/14 0330 10/05/14 0400 10/06/14 0605  WBC 26.7* 19.9* 12.7*  HGB 10.9* 10.3* 10.3*  HCT 31.9* 30.1* 30.9*  PLT 71* 57* 56*   Coag's  Recent Labs Lab 10/04/14 1054 10/06/14 0605  INR 1.15 1.13   BMET  Recent Labs Lab 10/05/14 0400 10/05/14 1600 10/06/14 0500  NA 130* 132* 132*  K 3.9 3.8 4.5  CL 97 97 98  CO2 '19 20 23  ' BUN 37* 33* 26*  CREATININE 2.45* 2.10* 1.78*  GLUCOSE 228* 213* 220*   Electrolytes  Recent Labs Lab 10/03/14 0412  10/05/14 0400 10/05/14 1600 10/06/14 0500  CALCIUM 8.2*  < > 8.1* 7.7* 7.7*  MG 1.8  --  2.0  --  2.2  PHOS 1.9*  < > 2.1* 1.8* 1.9*  < > = values in this interval not  displayed. Sepsis Markers  Recent Labs Lab 10/01/14 1007 10/01/14 1650 10/03/14 0421  LATICACIDVEN 1.28 1.6 1.9   ABG  Recent Labs Lab 10/03/14 2147 10/04/14 1059 10/05/14 1109  PHART 7.216* 7.251* 7.318*  PCO2ART 36.8 36.1 42.6  PO2ART 68.0* 84.0 74.0*   Liver Enzymes  Recent Labs Lab 10/01/14 0950 10/04/14 0330  10/05/14 0400 10/05/14 1600 10/06/14 0500  AST 37 34  --   --   --   --   ALT 14 40*  --   --   --   --   ALKPHOS 121* 112  --   --   --   --   BILITOT 0.4 0.2*  --   --   --   --   ALBUMIN 2.4* 1.3*  < > 1.4* 1.3* 1.4*  < > = values in this interval not displayed. Cardiac Enzymes  Recent Labs Lab 10/01/14 0950 10/01/14 1649 10/02/14 0001  TROPONINI  --  <0.30 <0.30  PROBNP  2023.0*  --   --    Glucose  Recent Labs Lab 10/05/14 1543 10/05/14 1932 10/05/14 2330 10/06/14 0339 10/06/14 0754 10/06/14 0812  GLUCAP 144* 182* 194* 217* 56* 164*    Imaging Ct Portable Head W/o Cm  10/05/2014   CLINICAL DATA:  Anoxic brain injury.  EXAM: CT HEAD WITHOUT CONTRAST  TECHNIQUE: Contiguous axial images were obtained from the base of the skull through the vertex without intravenous contrast.  COMPARISON:  CT scan of October 01, 2014.  FINDINGS: Bony calvarium appears intact. Left maxillary sinusitis is noted. No mass effect or midline shift is noted. Ventricular size is within normal limits. There is no evidence of mass lesion, hemorrhage or acute infarction.  IMPRESSION: Left maxillary sinusitis.  No acute intracranial abnormality seen.   Electronically Signed   By: Sabino Dick M.D.   On: 10/05/2014 14:24     ASSESSMENT / PLAN:  PULMONARY OETT 11/13>>> A: Acute resp failure, uncompensated met acidosis PNA  P:   -MV too high for wean - keep same MV, remains on rate 35, likely needs aline re attempt - F/U CXR in am for ali pattern -consider line, see cvs -BD   CARDIOVASCULAR CVL L Lowry TLC 11/13>>> A: Hypotension likely related to acidosis and sepsis. Neg troponins .  CHF >echo 11/14 w/ decreased EF  11/15>max pressors unable to wean, CVP 13  RUE/Hand with mottling  P:  - cvp 15, cvvhd for volume off -follow pressor needs with above approach - needs CT - titrate pressors for MAP >60 -Vascular consult following  RENAL A:   Acute renal failure, ATN  P:   -cvvhd planned -chem in am kvo  GASTROINTESTINAL A:  ? Swallowed dentures>not seen on CT abd/pelvis>>>neg on CT, BM noted yeah!!!! 11/18 P: -  - NGT. -TF to goal -assess for BM, success with colace, mrylax   HEMATOLOGIC A:  Anemia, thrombocytopenia (sepsis), r/o HITT type 1   P:  -continued to hold heparin -scd -cbc in am  INFECTIOUS A:  Likely aspiration PNA. Sepsis  MSSA  bacteremia CONTINUED Pressors, r/o abscess seeding>? P:   BCx2 11/13>>>1/2 staph aureus > BC 11/17>>> UC 11/13>>> Sputum 11/13>>>GPC > Abx: Cefepime, start date 11/13>>> Vanc 11/14 > 11/16  Per ID Ancef 11/16>>>  For CT abdo/pelvis r/o abscess, seeding Change line TEE needed  ENDOCRINE A:  DM and DKA>BS tr down on Insulin drip  Cortisol high 11/13  P:   - lantus, ssi - CBGs. -repeat cortisol  for burn out AI  NEUROLOGIC A:  AMS due to DKA and acidosis, reassuring eeg, CT without sig anoxia P:   RASS goal: 0 - PRN versed and fentanyl drip -WUA  FAMILY  Husband updated in full 11/16 - Updates: sister, long time significant other updated at bedside at length by me on 11/15   TODAY'S SUMMARY: continued pressors, for line change, CT , tee, place a line, repeat abg, cvvhd cotninued  Ccm time 30 min   Lavon Paganini. Titus Mould, MD, [place] Pgr: [facility]

## 2014-10-07 ENCOUNTER — Inpatient Hospital Stay (HOSPITAL_COMMUNITY): Payer: Medicare Other

## 2014-10-07 DIAGNOSIS — I351 Nonrheumatic aortic (valve) insufficiency: Secondary | ICD-10-CM

## 2014-10-07 DIAGNOSIS — E860 Dehydration: Secondary | ICD-10-CM

## 2014-10-07 LAB — POCT I-STAT EG7
ACID-BASE EXCESS: 12 mmol/L — AB (ref 0.0–2.0)
ACID-BASE EXCESS: 9 mmol/L — AB (ref 0.0–2.0)
ACID-BASE EXCESS: 12 mmol/L — AB (ref 0.0–2.0)
ACID-BASE EXCESS: 12 mmol/L — AB (ref 0.0–2.0)
Acid-Base Excess: 3 mmol/L — ABNORMAL HIGH (ref 0.0–2.0)
Acid-Base Excess: 6 mmol/L — ABNORMAL HIGH (ref 0.0–2.0)
Acid-Base Excess: 6 mmol/L — ABNORMAL HIGH (ref 0.0–2.0)
Acid-Base Excess: 6 mmol/L — ABNORMAL HIGH (ref 0.0–2.0)
Acid-Base Excess: 7 mmol/L — ABNORMAL HIGH (ref 0.0–2.0)
Acid-Base Excess: 8 mmol/L — ABNORMAL HIGH (ref 0.0–2.0)
Acid-Base Excess: 11 mmol/L — ABNORMAL HIGH (ref 0.0–2.0)
Acid-Base Excess: 10 mmol/L — ABNORMAL HIGH (ref 0.0–2.0)
Acid-Base Excess: 15 mmol/L — ABNORMAL HIGH (ref 0.0–2.0)
Acid-Base Excess: 16 mmol/L — ABNORMAL HIGH (ref 0.0–2.0)
Acid-Base Excess: 16 mmol/L — ABNORMAL HIGH (ref 0.0–2.0)
BICARBONATE: 33.5 meq/L — AB (ref 20.0–24.0)
BICARBONATE: 34.3 meq/L — AB (ref 20.0–24.0)
BICARBONATE: 35.8 meq/L — AB (ref 20.0–24.0)
BICARBONATE: 37.3 meq/L — AB (ref 20.0–24.0)
BICARBONATE: 41 meq/L — AB (ref 20.0–24.0)
BICARBONATE: 40.9 meq/L — AB (ref 20.0–24.0)
Bicarbonate: 28.8 mEq/L — ABNORMAL HIGH (ref 20.0–24.0)
Bicarbonate: 31.2 mEq/L — ABNORMAL HIGH (ref 20.0–24.0)
Bicarbonate: 31 mEq/L — ABNORMAL HIGH (ref 20.0–24.0)
Bicarbonate: 32.1 mEq/L — ABNORMAL HIGH (ref 20.0–24.0)
Bicarbonate: 31.9 mEq/L — ABNORMAL HIGH (ref 20.0–24.0)
Bicarbonate: 36.1 mEq/L — ABNORMAL HIGH (ref 20.0–24.0)
Bicarbonate: 36.6 mEq/L — ABNORMAL HIGH (ref 20.0–24.0)
Bicarbonate: 40 mEq/L — ABNORMAL HIGH (ref 20.0–24.0)
Bicarbonate: 38 mEq/L — ABNORMAL HIGH (ref 20.0–24.0)
CALCIUM ION: 0.98 mmol/L — AB (ref 1.13–1.30)
CALCIUM ION: 0.94 mmol/L — AB (ref 1.13–1.30)
CALCIUM ION: 0.61 mmol/L — AB (ref 1.13–1.30)
CALCIUM ION: 0.58 mmol/L — AB (ref 1.13–1.30)
CALCIUM ION: 0.86 mmol/L — AB (ref 1.13–1.30)
CALCIUM ION: 0.5 mmol/L — AB (ref 1.13–1.30)
Calcium, Ion: 0.66 mmol/L — ABNORMAL LOW (ref 1.13–1.30)
Calcium, Ion: 0.64 mmol/L — CL (ref 1.13–1.30)
Calcium, Ion: 0.92 mmol/L — ABNORMAL LOW (ref 1.13–1.30)
Calcium, Ion: 0.57 mmol/L — CL (ref 1.13–1.30)
Calcium, Ion: 0.89 mmol/L — ABNORMAL LOW (ref 1.13–1.30)
Calcium, Ion: 0.58 mmol/L — CL (ref 1.13–1.30)
Calcium, Ion: 0.84 mmol/L — ABNORMAL LOW (ref 1.13–1.30)
Calcium, Ion: 0.86 mmol/L — ABNORMAL LOW (ref 1.13–1.30)
Calcium, Ion: 0.43 mmol/L — CL (ref 1.13–1.30)
HCT: 29 % — ABNORMAL LOW (ref 36.0–46.0)
HCT: 30 % — ABNORMAL LOW (ref 36.0–46.0)
HCT: 28 % — ABNORMAL LOW (ref 36.0–46.0)
HCT: 28 % — ABNORMAL LOW (ref 36.0–46.0)
HCT: 29 % — ABNORMAL LOW (ref 36.0–46.0)
HCT: 26 % — ABNORMAL LOW (ref 36.0–46.0)
HCT: 28 % — ABNORMAL LOW (ref 36.0–46.0)
HCT: 29 % — ABNORMAL LOW (ref 36.0–46.0)
HCT: 28 % — ABNORMAL LOW (ref 36.0–46.0)
HCT: 28 % — ABNORMAL LOW (ref 36.0–46.0)
HEMATOCRIT: 27 % — AB (ref 36.0–46.0)
HEMATOCRIT: 27 % — AB (ref 36.0–46.0)
HEMATOCRIT: 27 % — AB (ref 36.0–46.0)
HEMATOCRIT: 30 % — AB (ref 36.0–46.0)
HEMATOCRIT: 29 % — AB (ref 36.0–46.0)
HEMOGLOBIN: 9.2 g/dL — AB (ref 12.0–15.0)
HEMOGLOBIN: 9.5 g/dL — AB (ref 12.0–15.0)
HEMOGLOBIN: 8.8 g/dL — AB (ref 12.0–15.0)
HEMOGLOBIN: 9.2 g/dL — AB (ref 12.0–15.0)
HEMOGLOBIN: 9.2 g/dL — AB (ref 12.0–15.0)
Hemoglobin: 10.2 g/dL — ABNORMAL LOW (ref 12.0–15.0)
Hemoglobin: 9.9 g/dL — ABNORMAL LOW (ref 12.0–15.0)
Hemoglobin: 9.5 g/dL — ABNORMAL LOW (ref 12.0–15.0)
Hemoglobin: 9.9 g/dL — ABNORMAL LOW (ref 12.0–15.0)
Hemoglobin: 9.5 g/dL — ABNORMAL LOW (ref 12.0–15.0)
Hemoglobin: 9.9 g/dL — ABNORMAL LOW (ref 12.0–15.0)
Hemoglobin: 9.5 g/dL — ABNORMAL LOW (ref 12.0–15.0)
Hemoglobin: 10.2 g/dL — ABNORMAL LOW (ref 12.0–15.0)
Hemoglobin: 9.5 g/dL — ABNORMAL LOW (ref 12.0–15.0)
Hemoglobin: 9.9 g/dL — ABNORMAL LOW (ref 12.0–15.0)
O2 SAT: 77 %
O2 SAT: 58 %
O2 SAT: 62 %
O2 SAT: 56 %
O2 SAT: 60 %
O2 SAT: 54 %
O2 SAT: 48 %
O2 SAT: 58 %
O2 Saturation: 75 %
O2 Saturation: 52 %
O2 Saturation: 52 %
O2 Saturation: 57 %
O2 Saturation: 53 %
O2 Saturation: 56 %
O2 Saturation: 46 %
PCO2 VEN: 46.4 mmHg (ref 45.0–50.0)
PCO2 VEN: 47.2 mmHg (ref 45.0–50.0)
PCO2 VEN: 47.3 mmHg (ref 45.0–50.0)
PCO2 VEN: 51.2 mmHg — AB (ref 45.0–50.0)
PCO2 VEN: 47.2 mmHg (ref 45.0–50.0)
PCO2 VEN: 46.2 mmHg (ref 45.0–50.0)
PCO2 VEN: 50.6 mmHg — AB (ref 45.0–50.0)
PCO2 VEN: 52.9 mmHg — AB (ref 45.0–50.0)
PH VEN: 7.456 — AB (ref 7.250–7.300)
PH VEN: 7.446 — AB (ref 7.250–7.300)
PH VEN: 7.496 — AB (ref 7.250–7.300)
PO2 VEN: 41 mmHg (ref 30.0–45.0)
PO2 VEN: 31 mmHg (ref 30.0–45.0)
PO2 VEN: 28 mmHg — AB (ref 30.0–45.0)
PO2 VEN: 26 mmHg — AB (ref 30.0–45.0)
PO2 VEN: 26 mmHg — AB (ref 30.0–45.0)
PO2 VEN: 28 mmHg — AB (ref 30.0–45.0)
POTASSIUM: 3.7 meq/L (ref 3.7–5.3)
POTASSIUM: 3.8 meq/L (ref 3.7–5.3)
POTASSIUM: 3.7 meq/L (ref 3.7–5.3)
POTASSIUM: 3.7 meq/L (ref 3.7–5.3)
POTASSIUM: 3.9 meq/L (ref 3.7–5.3)
POTASSIUM: 3.4 meq/L — AB (ref 3.7–5.3)
Patient temperature: 97.6
Patient temperature: 97.6
Patient temperature: 97.6
Patient temperature: 97.6
Patient temperature: 97.6
Patient temperature: 98.2
Patient temperature: 98.2
Patient temperature: 98.2
Potassium: 3.8 mEq/L (ref 3.7–5.3)
Potassium: 3.8 mEq/L (ref 3.7–5.3)
Potassium: 3.7 mEq/L (ref 3.7–5.3)
Potassium: 3.8 mEq/L (ref 3.7–5.3)
Potassium: 3.5 mEq/L — ABNORMAL LOW (ref 3.7–5.3)
Potassium: 3.6 mEq/L — ABNORMAL LOW (ref 3.7–5.3)
Potassium: 3.6 mEq/L — ABNORMAL LOW (ref 3.7–5.3)
Potassium: 4.1 mEq/L (ref 3.7–5.3)
Potassium: 4 mEq/L (ref 3.7–5.3)
SODIUM: 132 meq/L — AB (ref 137–147)
SODIUM: 132 meq/L — AB (ref 137–147)
SODIUM: 132 meq/L — AB (ref 137–147)
SODIUM: 134 meq/L — AB (ref 137–147)
SODIUM: 134 meq/L — AB (ref 137–147)
SODIUM: 134 meq/L — AB (ref 137–147)
Sodium: 130 mEq/L — ABNORMAL LOW (ref 137–147)
Sodium: 131 mEq/L — ABNORMAL LOW (ref 137–147)
Sodium: 131 mEq/L — ABNORMAL LOW (ref 137–147)
Sodium: 131 mEq/L — ABNORMAL LOW (ref 137–147)
Sodium: 132 mEq/L — ABNORMAL LOW (ref 137–147)
Sodium: 133 mEq/L — ABNORMAL LOW (ref 137–147)
Sodium: 132 mEq/L — ABNORMAL LOW (ref 137–147)
Sodium: 131 mEq/L — ABNORMAL LOW (ref 137–147)
Sodium: 133 mEq/L — ABNORMAL LOW (ref 137–147)
TCO2: 30 mmol/L (ref 0–100)
TCO2: 33 mmol/L (ref 0–100)
TCO2: 32 mmol/L (ref 0–100)
TCO2: 34 mmol/L (ref 0–100)
TCO2: 33 mmol/L (ref 0–100)
TCO2: 35 mmol/L (ref 0–100)
TCO2: 37 mmol/L (ref 0–100)
TCO2: 38 mmol/L (ref 0–100)
TCO2: 36 mmol/L (ref 0–100)
TCO2: 37 mmol/L (ref 0–100)
TCO2: 39 mmol/L (ref 0–100)
TCO2: 42 mmol/L (ref 0–100)
TCO2: 40 mmol/L (ref 0–100)
TCO2: 43 mmol/L (ref 0–100)
TCO2: 42 mmol/L (ref 0–100)
pCO2, Ven: 47.7 mmHg (ref 45.0–50.0)
pCO2, Ven: 48.6 mmHg (ref 45.0–50.0)
pCO2, Ven: 50.5 mmHg — ABNORMAL HIGH (ref 45.0–50.0)
pCO2, Ven: 53.5 mmHg — ABNORMAL HIGH (ref 45.0–50.0)
pCO2, Ven: 53.9 mmHg — ABNORMAL HIGH (ref 45.0–50.0)
pCO2, Ven: 56.8 mmHg — ABNORMAL HIGH (ref 45.0–50.0)
pCO2, Ven: 46.8 mmHg (ref 45.0–50.0)
pH, Ven: 7.4 — ABNORMAL HIGH (ref 7.250–7.300)
pH, Ven: 7.426 — ABNORMAL HIGH (ref 7.250–7.300)
pH, Ven: 7.403 — ABNORMAL HIGH (ref 7.250–7.300)
pH, Ven: 7.422 — ABNORMAL HIGH (ref 7.250–7.300)
pH, Ven: 7.436 — ABNORMAL HIGH (ref 7.250–7.300)
pH, Ven: 7.452 — ABNORMAL HIGH (ref 7.250–7.300)
pH, Ven: 7.483 — ABNORMAL HIGH (ref 7.250–7.300)
pH, Ven: 7.499 — ABNORMAL HIGH (ref 7.250–7.300)
pH, Ven: 7.438 — ABNORMAL HIGH (ref 7.250–7.300)
pH, Ven: 7.481 — ABNORMAL HIGH (ref 7.250–7.300)
pH, Ven: 7.433 — ABNORMAL HIGH (ref 7.250–7.300)
pH, Ven: 7.547 — ABNORMAL HIGH (ref 7.250–7.300)
pO2, Ven: 39 mmHg (ref 30.0–45.0)
pO2, Ven: 30 mmHg (ref 30.0–45.0)
pO2, Ven: 30 mmHg (ref 30.0–45.0)
pO2, Ven: 25 mmHg — CL (ref 30.0–45.0)
pO2, Ven: 28 mmHg — CL (ref 30.0–45.0)
pO2, Ven: 27 mmHg — CL (ref 30.0–45.0)
pO2, Ven: 26 mmHg — CL (ref 30.0–45.0)
pO2, Ven: 28 mmHg — CL (ref 30.0–45.0)
pO2, Ven: 21 mmHg — CL (ref 30.0–45.0)

## 2014-10-07 LAB — RENAL FUNCTION PANEL
ALBUMIN: 1.3 g/dL — AB (ref 3.5–5.2)
ANION GAP: 14 (ref 5–15)
Albumin: 1.3 g/dL — ABNORMAL LOW (ref 3.5–5.2)
Albumin: 1.3 g/dL — ABNORMAL LOW (ref 3.5–5.2)
Anion gap: 13 (ref 5–15)
Anion gap: 12 (ref 5–15)
BUN: 20 mg/dL (ref 6–23)
BUN: 18 mg/dL (ref 6–23)
BUN: 16 mg/dL (ref 6–23)
CHLORIDE: 90 meq/L — AB (ref 96–112)
CHLORIDE: 87 meq/L — AB (ref 96–112)
CO2: 29 meq/L (ref 19–32)
CO2: 33 meq/L — AB (ref 19–32)
CO2: 37 meq/L — AB (ref 19–32)
CREATININE: 1.38 mg/dL — AB (ref 0.50–1.10)
Calcium: 8.9 mg/dL (ref 8.4–10.5)
Calcium: 8.7 mg/dL (ref 8.4–10.5)
Calcium: 10.1 mg/dL (ref 8.4–10.5)
Chloride: 89 mEq/L — ABNORMAL LOW (ref 96–112)
Creatinine, Ser: 1.46 mg/dL — ABNORMAL HIGH (ref 0.50–1.10)
Creatinine, Ser: 1.31 mg/dL — ABNORMAL HIGH (ref 0.50–1.10)
GFR calc Af Amer: 44 mL/min — ABNORMAL LOW (ref >90)
GFR calc Af Amer: 47 mL/min — ABNORMAL LOW (ref >90)
GFR calc Af Amer: 50 mL/min — ABNORMAL LOW (ref >90)
GFR calc non Af Amer: 38 mL/min — ABNORMAL LOW (ref >90)
GFR, EST NON AFRICAN AMERICAN: 40 mL/min — AB (ref >90)
GFR, EST NON AFRICAN AMERICAN: 43 mL/min — AB (ref >90)
GLUCOSE: 196 mg/dL — AB (ref 70–99)
Glucose, Bld: 275 mg/dL — ABNORMAL HIGH (ref 70–99)
Glucose, Bld: 195 mg/dL — ABNORMAL HIGH (ref 70–99)
POTASSIUM: 4 meq/L (ref 3.7–5.3)
Phosphorus: 1.5 mg/dL — ABNORMAL LOW (ref 2.3–4.6)
Phosphorus: 1.1 mg/dL — ABNORMAL LOW (ref 2.3–4.6)
Phosphorus: 2.4 mg/dL (ref 2.3–4.6)
Potassium: 3.9 mEq/L (ref 3.7–5.3)
Potassium: 4.2 mEq/L (ref 3.7–5.3)
SODIUM: 136 meq/L — AB (ref 137–147)
Sodium: 132 mEq/L — ABNORMAL LOW (ref 137–147)
Sodium: 136 mEq/L — ABNORMAL LOW (ref 137–147)

## 2014-10-07 LAB — POCT I-STAT 3, ART BLOOD GAS (G3+)
ACID-BASE EXCESS: 13 mmol/L — AB (ref 0.0–2.0)
ACID-BASE EXCESS: 24 mmol/L — AB (ref 0.0–2.0)
Acid-Base Excess: 23 mmol/L — ABNORMAL HIGH (ref 0.0–2.0)
BICARBONATE: 37.9 meq/L — AB (ref 20.0–24.0)
Bicarbonate: 46.9 mEq/L — ABNORMAL HIGH (ref 20.0–24.0)
Bicarbonate: 49.6 mEq/L — ABNORMAL HIGH (ref 20.0–24.0)
O2 SAT: 95 %
O2 Saturation: 93 %
O2 Saturation: 98 %
PCO2 ART: 50 mmHg — AB (ref 35.0–45.0)
PO2 ART: 74 mmHg — AB (ref 80.0–100.0)
PO2 ART: 97 mmHg (ref 80.0–100.0)
Patient temperature: 97.2
Patient temperature: 97.3
TCO2: 39 mmol/L (ref 0–100)
TCO2: 48 mmol/L (ref 0–100)
TCO2: >50 mmol/L (ref 0–100)
pCO2 arterial: 42.8 mmHg (ref 35.0–45.0)
pCO2 arterial: 53.5 mmHg — ABNORMAL HIGH (ref 35.0–45.0)
pH, Arterial: 7.488 — ABNORMAL HIGH (ref 7.350–7.450)
pH, Arterial: 7.645 (ref 7.350–7.450)
pH, Arterial: 7.573 — ABNORMAL HIGH (ref 7.350–7.450)
pO2, Arterial: 52 mmHg — ABNORMAL LOW (ref 80.0–100.0)

## 2014-10-07 LAB — POCT I-STAT 7, (LYTES, BLD GAS, ICA,H+H)
ACID-BASE EXCESS: 14 mmol/L — AB (ref 0.0–2.0)
Acid-Base Excess: 17 mmol/L — ABNORMAL HIGH (ref 0.0–2.0)
Acid-Base Excess: 22 mmol/L — ABNORMAL HIGH (ref 0.0–2.0)
BICARBONATE: 39.2 meq/L — AB (ref 20.0–24.0)
Bicarbonate: 40.9 mEq/L — ABNORMAL HIGH (ref 20.0–24.0)
Bicarbonate: 44.9 mEq/L — ABNORMAL HIGH (ref 20.0–24.0)
Calcium, Ion: 0.56 mmol/L — CL (ref 1.13–1.30)
Calcium, Ion: 1.08 mmol/L — ABNORMAL LOW (ref 1.13–1.30)
Calcium, Ion: 1.12 mmol/L — ABNORMAL LOW (ref 1.13–1.30)
HCT: 26 % — ABNORMAL LOW (ref 36.0–46.0)
HCT: 26 % — ABNORMAL LOW (ref 36.0–46.0)
HCT: 27 % — ABNORMAL LOW (ref 36.0–46.0)
HEMOGLOBIN: 8.8 g/dL — AB (ref 12.0–15.0)
Hemoglobin: 8.8 g/dL — ABNORMAL LOW (ref 12.0–15.0)
Hemoglobin: 9.2 g/dL — ABNORMAL LOW (ref 12.0–15.0)
O2 SAT: 48 %
O2 SAT: 92 %
O2 Saturation: 97 %
PCO2 ART: 43.6 mmHg (ref 35.0–45.0)
PCO2 ART: 52.6 mmHg — AB (ref 35.0–45.0)
PH ART: 7.577 — AB (ref 7.350–7.450)
PO2 ART: 24 mmHg — AB (ref 80.0–100.0)
PO2 ART: 48 mmHg — AB (ref 80.0–100.0)
POTASSIUM: 4 meq/L (ref 3.7–5.3)
Patient temperature: 97.2
Patient temperature: 97.2
Patient temperature: 97.2
Potassium: 4 mEq/L (ref 3.7–5.3)
Potassium: 3.6 mEq/L — ABNORMAL LOW (ref 3.7–5.3)
Sodium: 131 mEq/L — ABNORMAL LOW (ref 137–147)
Sodium: 134 mEq/L — ABNORMAL LOW (ref 137–147)
Sodium: 132 mEq/L — ABNORMAL LOW (ref 137–147)
TCO2: 41 mmol/L (ref 0–100)
TCO2: 42 mmol/L (ref 0–100)
TCO2: 46 mmol/L (ref 0–100)
pCO2 arterial: 39.3 mmHg (ref 35.0–45.0)
pH, Arterial: 7.478 — ABNORMAL HIGH (ref 7.350–7.450)
pH, Arterial: 7.664 (ref 7.350–7.450)
pO2, Arterial: 76 mmHg — ABNORMAL LOW (ref 80.0–100.0)

## 2014-10-07 LAB — MAGNESIUM: Magnesium: 2 mg/dL (ref 1.5–2.5)

## 2014-10-07 LAB — GLUCOSE, CAPILLARY
GLUCOSE-CAPILLARY: 139 mg/dL — AB (ref 70–99)
GLUCOSE-CAPILLARY: 169 mg/dL — AB (ref 70–99)
GLUCOSE-CAPILLARY: 173 mg/dL — AB (ref 70–99)
Glucose-Capillary: 224 mg/dL — ABNORMAL HIGH (ref 70–99)
Glucose-Capillary: 201 mg/dL — ABNORMAL HIGH (ref 70–99)
Glucose-Capillary: 131 mg/dL — ABNORMAL HIGH (ref 70–99)

## 2014-10-07 LAB — CULTURE, BLOOD (ROUTINE X 2): Culture: NO GROWTH

## 2014-10-07 LAB — CORTISOL: Cortisol, Plasma: 29.5 ug/dL

## 2014-10-07 LAB — CALCIUM, IONIZED: CALCIUM ION: 0.87 mmol/L — AB (ref 1.13–1.30)

## 2014-10-07 LAB — APTT
APTT: 52 s — AB (ref 24–37)
aPTT: 53 seconds — ABNORMAL HIGH (ref 24–37)

## 2014-10-07 MED ORDER — ARGATROBAN 50 MG/50ML IV SOLN
21.0000 ug/min | INTRAVENOUS | Status: DC
  Filled 2014-10-07: qty 50

## 2014-10-07 MED ORDER — VITAL HIGH PROTEIN PO LIQD
1000.0000 mL | ORAL | Status: DC
  Administered 2014-10-07 – 2014-10-10 (×4): 1000 mL
  Filled 2014-10-07 (×9): qty 1000

## 2014-10-07 MED ORDER — ARGATROBAN 50 MG/50ML IV SOLN
0.2500 ug/kg/min | INTRAVENOUS | Status: DC
  Administered 2014-10-07: 0.25 ug/kg/min via INTRAVENOUS
  Filled 2014-10-07 (×2): qty 50

## 2014-10-07 MED ORDER — POTASSIUM PHOSPHATES 15 MMOLE/5ML IV SOLN
30.0000 mmol | Freq: Once | INTRAVENOUS | Status: AC
  Administered 2014-10-07: 30 mmol via INTRAVENOUS
  Filled 2014-10-07: qty 10

## 2014-10-07 MED ORDER — ETOMIDATE 2 MG/ML IV SOLN
10.0000 mg | Freq: Once | INTRAVENOUS | Status: AC
  Administered 2014-10-07: 10 mg via INTRAVENOUS

## 2014-10-07 MED ORDER — MIDAZOLAM HCL 2 MG/2ML IJ SOLN
2.0000 mg | Freq: Once | INTRAMUSCULAR | Status: AC
  Administered 2014-10-07: 2 mg via INTRAVENOUS
  Filled 2014-10-07: qty 2

## 2014-10-07 MED ORDER — ETOMIDATE 2 MG/ML IV SOLN
INTRAVENOUS | Status: AC
  Filled 2014-10-07: qty 10

## 2014-10-07 MED ORDER — SODIUM CHLORIDE 0.9 % IV BOLUS (SEPSIS)
500.0000 mL | Freq: Once | INTRAVENOUS | Status: AC
  Administered 2014-10-07: 500 mL via INTRAVENOUS

## 2014-10-07 NOTE — Progress Notes (Signed)
Patient ID: [name], female   DOB: 05-10-1951, 63 y.o.   MRN: [id number]  [facility] Progress Note    Assessment/ Plan:   1. Acute renal injury: This appears to be from ATN of sepsis +/- PEA arrest. Anuric and now on CRRT support. Will continue CRRT at current prescription with UF of 50-15400mL/hr as tolerated by her hypotension (remains on neosynephrin and vasopressin). On citrate anticoagulation--now getting alkalotic. 2. Hypophosphatemia: due to CRRT losses- replace with 30mmol Kphos IV 3. Altered mental status: seen by neurology to help evaluate for possible hypoxic/anoxic brain injury following PEA arrest, EEG shows evidence of metabolic encephalopathy. She appears to be localizing voice/following simple commands. 4. Aspiration pneumonia/sepsis/MSSA bacteremia: on Ancef and being followed by ID- imaging and testing repeated yesterday for continued hypotension/sepsis. 5. Right hand ischemia:improving right hand perfusion-vascular surgery following. Remains on pressors and right arm protected-no plans for surgical intervention. 6. Hyponatremia: surprising on CRRT without additional hypotonic fluids- will follow  Subjective:   No acute events overnight   Objective:   BP 103/66 mmHg  Pulse 81  Temp(Src) 97.7 F (36.5 C) (Oral)  Resp 35  Ht 5\' 4"  (1.626 m)  Wt 82.3 kg (181 lb 7 oz)  BMI 31.13 kg/m2  SpO2 97%  Intake/Output Summary (Last 24 hours) at 10/07/14 0754 Last data filed at 10/07/14 0700  Gross per 24 hour  Intake   5221 ml  Output   5963 ml  Net   -742 ml   Weight change: 1.2 kg (2 lb 10.3 oz)  Physical Exam: WGN:FAOZHYQMVHQGen:comfortable in bed- intubated/awake ION:GEXBMCVS:pulse RRR, normal s1 and s2 Resp:Coarse bilaterally, no rales WUX:LKGMAbd:soft, obese, NT, BS normal Ext:2+ LE edema  Imaging: Ct Abdomen Pelvis Wo Contrast  10/06/2014   CLINICAL DATA:  Penicillin counter for sepsis, looking for source  EXAM: CT CHEST WITHOUT CONTRAST  TECHNIQUE: Multidetector CT  imaging of the chest was performed following the standard protocol without IV contrast.  COMPARISON:  10/01/2014  FINDINGS: The patient is intubated with tip of intubation tube above the carina. A right central line terminates just above the cavoatrial junction. There is extensive bilateral multifocal airspace consolidation. Multiple rounded areas of consolidation are seen throughout the upper lobes bilaterally. There are air bronchograms through many of these. The largest area of upper lobe consolidation is on the left, measuring 5 by cm. This is a relatively confluent area of consolidation. Both lower lobes show extensive deep tendon consolidation, worse when compared to the prior study. There are moderate bilateral pleural effusions which appear larger when compared to the prior study.  No significant adenopathy in the hilar or mediastinal regions. Minimal pericardial effusion. NG tube extends into the stomach.  Liver, spleen, pancreas, kidneys, gallbladder, and adrenal glands show no acute findings.  Mild calcification of the abdominal aorta.  Nonobstructive gas pattern with normal stomach small bowel and large bowel except for stool retained throughout the large bowel. Rectum is distended moderately with stool.  Bladder is decompressed. Trace free fluid in the pelvis. Heavily calcified 2.5 cm object left adnexal region likely a calcified uterine fibroid.  Significantly increased attenuation throughout the subcutaneous soft tissues consistent with anasarca.  No acute musculoskeletal findings.  IMPRESSION: Widespread multinodular extensive consolidation throughout both lungs. The extensive consolidation has increased when compared to the prior study, as has the size of moderate bilateral pleural effusions. Consolidation in the lower lobes and in the left upper lobe is relatively confluent in may represent pneumonia or aspiration pneumonitis. Rounded  areas of consolidation seen throughout the bilateral upper lobes  could potentially be related to septic emboli.  No acute abdominal abnormality although there is evidence of constipation with moderate fecal impaction in the rectum. There is also evidence of anasarca.   Electronically Signed   By: Esperanza Heiraymond  Rubner M.D.   On: 10/06/2014 17:33   Ct Chest Wo Contrast  10/06/2014   CLINICAL DATA:  Penicillin counter for sepsis, looking for source  EXAM: CT CHEST WITHOUT CONTRAST  TECHNIQUE: Multidetector CT imaging of the chest was performed following the standard protocol without IV contrast.  COMPARISON:  10/01/2014  FINDINGS: The patient is intubated with tip of intubation tube above the carina. A right central line terminates just above the cavoatrial junction. There is extensive bilateral multifocal airspace consolidation. Multiple rounded areas of consolidation are seen throughout the upper lobes bilaterally. There are air bronchograms through many of these. The largest area of upper lobe consolidation is on the left, measuring 5 by cm. This is a relatively confluent area of consolidation. Both lower lobes show extensive deep tendon consolidation, worse when compared to the prior study. There are moderate bilateral pleural effusions which appear larger when compared to the prior study.  No significant adenopathy in the hilar or mediastinal regions. Minimal pericardial effusion. NG tube extends into the stomach.  Liver, spleen, pancreas, kidneys, gallbladder, and adrenal glands show no acute findings.  Mild calcification of the abdominal aorta.  Nonobstructive gas pattern with normal stomach small bowel and large bowel except for stool retained throughout the large bowel. Rectum is distended moderately with stool.  Bladder is decompressed. Trace free fluid in the pelvis. Heavily calcified 2.5 cm object left adnexal region likely a calcified uterine fibroid.  Significantly increased attenuation throughout the subcutaneous soft tissues consistent with anasarca.  No acute  musculoskeletal findings.  IMPRESSION: Widespread multinodular extensive consolidation throughout both lungs. The extensive consolidation has increased when compared to the prior study, as has the size of moderate bilateral pleural effusions. Consolidation in the lower lobes and in the left upper lobe is relatively confluent in may represent pneumonia or aspiration pneumonitis. Rounded areas of consolidation seen throughout the bilateral upper lobes could potentially be related to septic emboli.  No acute abdominal abnormality although there is evidence of constipation with moderate fecal impaction in the rectum. There is also evidence of anasarca.   Electronically Signed   By: Esperanza Heiraymond  Rubner M.D.   On: 10/06/2014 17:33   Dg Chest Port 1 View  10/06/2014   CLINICAL DATA:  Central line placement.  EXAM: PORTABLE CHEST - 1 VIEW  COMPARISON:  10/06/2014 L5 09 a.m.  FINDINGS: New right internal jugular central venous line has its tip projecting in the lower superior vena cava. No pneumothorax.  Two lumen left internal jugular central venous catheter and right subclavian central venous catheter are stable. Endotracheal tube is stable with its tip projecting 2.7 cm above the carina. Nasogastric tube passes below the diaphragm into the stomach.  Bilateral patchy areas of airspace opacity are unchanged. This may reflect asymmetric edema or multifocal pneumonia. The latter is suspected. More confluent opacity is noted in the left lung base, also stable. Small effusions are likely.  IMPRESSION: 1. New right internal jugular central venous line has its tip in the lower superior vena cava. No pneumothorax. 2. No other change from the earlier study. Persistent patchy areas of airspace opacity with more confluent left lung base opacity. Multifocal pneumonia is suspected.   Electronically Signed  By: Amie Portland M.D.   On: 10/06/2014 15:15   Dg Chest Port 1 View  10/06/2014   SHOULDER CC CLINICAL DATA: 63 year old  female acute respiratory failure, intubated. Initial encounter.  EXAM: PORTABLE CHEST - 1 VIEW  COMPARISON:  10/04/2014 and earlier.  FINDINGS: Portable AP semi upright view at 0509 hrs. Endotracheal tube tip now at the level the clavicles. Stable enteric tube, dual lumen left IJ catheter and single lumen right subclavian catheter. Stable cardiac size and mediastinal contours.  Mildly larger lung volumes. Continued dense retrocardiac opacity. Superimposed scattered bilateral patchy pulmonary opacity has mildly regressed since 10/04/2014. No pneumothorax or large effusion.  IMPRESSION: 1.  Stable lines and tubes. 2. Lower lobe collapse or consolidation. 3. Superimposed bilateral patchy pulmonary opacity suspicious for bilateral pneumonia has mildly regressed since 10/04/2014.   Electronically Signed   By: Augusto Gamble M.D.   On: 10/06/2014 06:38   Ct Portable Head W/o Cm  10/05/2014   CLINICAL DATA:  Anoxic brain injury.  EXAM: CT HEAD WITHOUT CONTRAST  TECHNIQUE: Contiguous axial images were obtained from the base of the skull through the vertex without intravenous contrast.  COMPARISON:  CT scan of October 01, 2014.  FINDINGS: Bony calvarium appears intact. Left maxillary sinusitis is noted. No mass effect or midline shift is noted. Ventricular size is within normal limits. There is no evidence of mass lesion, hemorrhage or acute infarction.  IMPRESSION: Left maxillary sinusitis.  No acute intracranial abnormality seen.   Electronically Signed   By: Roque Lias M.D.   On: 10/05/2014 14:24    Labs: BMET  Recent Labs Lab 10/04/14 1600 10/05/14 0400 10/05/14 1600 10/06/14 0500  10/06/14 1528  10/06/14 2300 10/06/14 2307 10/06/14 2313 10/07/14 0107 10/07/14 0113 10/07/14 0500 10/07/14 0513 10/07/14 0519  NA 129* 130* 132* 132*  < > 133*  < > 132* 132* 131* 132* 131* 136* 133* 132*  K 4.6 3.9 3.8 4.5  < > 4.0  < > 4.0 3.7 3.7 3.8 3.8 3.9 3.7 3.7  CL 97 97 97 98  --  93*  --  89*  --   --   --    --  90*  --   --   CO2 17* 19 20 23   --  27  --  29  --   --   --   --  33*  --   --   GLUCOSE 147* 228* 213* 220*  --  204*  --  196*  --   --   --   --  275*  --   --   BUN 45* 37* 33* 26*  --  23  --  20  --   --   --   --  18  --   --   CREATININE 3.16* 2.45* 2.10* 1.78*  --  1.55*  --  1.46*  --   --   --   --  1.38*  --   --   CALCIUM 8.2* 8.1* 7.7* 7.7*  --  8.7  --  8.9  --   --   --   --  8.7  --   --   PHOS 3.1 2.1* 1.8* 1.9*  --  1.5*  --  1.5*  --   --   --   --  1.1*  --   --   < > = values in this interval not displayed. CBC  Recent Labs Lab 10/01/14 1620  10/03/14  0703 10/04/14 0330 10/05/14 0400 10/06/14 0605  10/07/14 0107 10/07/14 0113 10/07/14 0513 10/07/14 0519  WBC 6.9  < > 25.6* 26.7* 19.9* 12.7*  --   --   --   --   --   NEUTROABS 6.1  --   --   --   --   --   --   --   --   --   --   HGB 13.6  < > 11.8* 10.9* 10.3* 10.3*  < > 9.9* 9.5* 9.5* 8.8*  HCT 40.8  < > 35.2* 31.9* 30.1* 30.9*  < > 29.0* 28.0* 28.0* 26.0*  MCV 81.1  < > 78.7 78.0 75.3* 76.7*  --   --   --   --   --   PLT 252  < > 83* 71* 57* 56*  --   --   --   --   --   < > = values in this interval not displayed.  Medications:    . antiseptic oral rinse  7 mL Mouth Rinse QID  .  ceFAZolin (ANCEF) IV  2 g Intravenous Q12H  . chlorhexidine  15 mL Mouth Rinse BID  . docusate  50 mg Per Tube Daily  . insulin aspart  0-15 Units Subcutaneous 6 times per day  . insulin glargine  10 Units Subcutaneous Daily  . ipratropium-albuterol  3 mL Nebulization Q6H  . pantoprazole (PROTONIX) IV  40 mg Intravenous Q24H  . polyethylene glycol  17 g Per Tube Daily   Zetta Bills, MD 10/07/2014, 7:54 AM

## 2014-10-07 NOTE — Procedures (Signed)
Central Venous Catheter Insertion Procedure Note - hd cath Abigail RutherfordDeborah A Zamora [id number] Jul 08, 1951  Procedure: Insertion of Central Venous Catheter Indications: HD, old cath to remove, clot on  Procedure Details Consent: Risks of procedure as well as the alternatives and risks of each were explained to the (patient/caregiver).  Consent for procedure obtained. Time Out: Verified patient identification, verified procedure, site/side was marked, verified correct patient position, special equipment/implants available, medications/allergies/relevent history reviewed, required imaging and test results available.  Performed  Maximum sterile technique was used including antiseptics, cap, gloves, gown, hand hygiene, mask and sheet. Skin prep: Chlorhexidine; local anesthetic administered A antimicrobial bonded/coated triple lumen catheter was placed in the left femoral vein due to patient being a dialysis patient using the Seldinger technique.  Evaluation Blood flow good Complications: No apparent complications Patient did tolerate procedure well. Chest X-ray ordered to verify placement.  CXR: not required  Abigail RossettiDaniel J. Zamora AliasFeinstein, MD, FACP Pgr: [phone] [name] [facility]   Nelda BucksFEINSTEIN,Abigail J. 10/07/2014, 2:34 PM

## 2014-10-07 NOTE — Progress Notes (Signed)
ANTICOAGULATION CONSULT NOTE - Initial Consult  Pharmacy Consult for argatroban Indication: SVC thrombus   Allergies  Allergen Reactions  . Penicillins     Rash     Patient Measurements: Height: 5\' 4"  (162.6 cm) Weight: 181 lb 7 oz (82.3 kg) IBW/kg (Calculated) : 54.7 Heparin Dosing Weight:   Vital Signs: Temp: 97.3 F (36.3 C) (11/19 1926) Temp Source: Oral (11/19 1926) BP: 93/56 mmHg (11/19 1915) Pulse Rate: 103 (11/19 1906)  Labs:  Recent Labs  10/05/14 0400  10/06/14 0605  10/06/14 2300  10/07/14 0500  10/07/14 1517 10/07/14 1625 10/07/14 1801 10/07/14 1811 10/07/14 1850  HGB 10.3*  --  10.3*  < >  --   < >  --   < > 8.8*  --  9.9* 9.2*  --   HCT 30.1*  --  30.9*  < >  --   < >  --   < > 26.0*  --  29.0* 27.0*  --   PLT 57*  --  56*  --   --   --   --   --   --   --   --   --   --   APTT  --   --   --   --   --   --   --   --   --   --   --   --  52*  LABPROT  --   --  14.7  --   --   --   --   --   --   --   --   --   --   INR  --   --  1.13  --   --   --   --   --   --   --   --   --   --   CREATININE 2.45*  < >  --   < > 1.46*  --  1.38*  --   --  1.31*  --   --   --   < > = values in this interval not displayed.  Estimated Creatinine Clearance: 46.8 mL/min (by C-G formula based on Cr of 1.31).   Medical History: Past Medical History  Diagnosis Date  . Mixed stress and urge urinary incontinence   . Schizophrenia   . Allergy     Medications:  See EMR  Assessment: 63 yo female with clot in superior vena cava extending to the R atrium. Pt is in acute renal failure on CRRT.  Pt received heparin at prophylaxis doses and heparin with CRRT from 11/13 through 11/18, when all heparin products were stopped due to declining platelets.  Plts 252 > 56. 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 The initial aPTT drawn this evening was drawn early but was therapeutic, at the low end of therapeutic range.    Goal of Therapy:  aPTT 50-90 seconds Monitor platelets by anticoagulation protocol: Yes   Plan:  Continue argatroban at current rate 0.25 mcg/kg/min Check aPTT in 2 hours, until 2 consecutive therapeutic levels have been reached Monitor for s/sx bleeding F/u HIT panel   Agapito GamesAlison Judy Goodenow, PharmD, BCPS Clinical Pharmacist Pager: [phone] 10/07/2014 7:52 PM

## 2014-10-07 NOTE — Progress Notes (Signed)
Per Tisha in the lab, blood cultures from 1500 were received

## 2014-10-07 NOTE — Progress Notes (Signed)
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 Penicillins     Rash     Patient Measurements: Height: 5\' 4"  (162.6 cm) Weight: 181 lb 7 oz (82.3 kg) IBW/kg (Calculated) : 54.7  Vital Signs: Temp: 97.7 F (36.5 C) (11/19 1159) Temp Source: Oral (11/19 1159) BP: 112/73 mmHg (11/19 1030) Pulse Rate: 115 (11/19 1015)  Labs:  Recent Labs  10/05/14 0400  10/06/14 0605  10/06/14 1528  10/06/14 2300  10/07/14 0500  10/07/14 0950 10/07/14 1150 10/07/14 1158  HGB 10.3*  --  10.3*  < >  --   < >  --   < >  --   < > 9.2* 10.2* 9.5*  HCT 30.1*  --  30.9*  < >  --   < >  --   < >  --   < > 27.0* 30.0* 28.0*  PLT 57*  --  56*  --   --   --   --   --   --   --   --   --   --   LABPROT  --   --  14.7  --   --   --   --   --   --   --   --   --   --   INR  --   --  1.13  --   --   --   --   --   --   --   --   --   --   CREATININE 2.45*  < >  --   --  1.55*  --  1.46*  --  1.38*  --   --   --   --   < > = values in this interval not displayed.  Estimated Creatinine Clearance: 44.4 mL/min (by C-G formula based on Cr of 1.38).   Medical History: Past Medical History  Diagnosis Date  . Mixed stress and urge urinary incontinence   . Schizophrenia   . 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 Patient is on CRRT. Hgb stable at 10.3, platelets are low at 57. LFTs are WNL.  Patient received heparin for DVT prophylaxis until 11/17 and received heparin X 1 with CRRT on 11/18.  All heparin has been discontinued and a HITT panel has been ordered.  Calculated 4-T Score of 5 - intermediate risk.  Due to her critical state in the ICU and low platelet count, will start Argatroban at 0.25 mcg/kg/min and increase as indicated by aPTTs.  Goal of Therapy:  aPTT 50-90  seconds Monitor platelets by anticoagulation protocol: Yes   Plan:  1) Argatroban 21 mcg/min 2) 2 hour aPTT 3) Daily CBC, aPTT 4) Monitor LFTs, platelet count, and signs of bleeding   Russ HaloAshley Laelyn Blumenthal, PharmD Clinical Pharmacist - Resident Pager: [phone] 11/19/20151:45 PM

## 2014-10-07 NOTE — Progress Notes (Signed)
PULMONARY / CRITICAL CARE MEDICINE   Name: [name] MRN: [id number] DOB: 25-Nov-1950    ADMISSION DATE:  10/01/2014 CONSULTATION DATE:  10/01/2014  REFERRING MD :  EDP  CHIEF COMPLAINT:  DKA, respiratory failure and AMS  INITIAL PRESENTATION: 63 year old female with PMH of diabetes and medical non-compliance who was last seen normal the day PTP at 8 PM.  Husband woke up this AM and was unable to arouse patient.  Evidently both have been "fighting a cold" but patient was coughing more the night before.  EMS was called and patient was brought to the ED.  In the ED patient became unresponsive and was intubated by EDP.  PCCM was called to admit.  STUDIES:  11/13 CXR with aspiration PNA and abdominal x-ray ? dentures in her lower abdomen. 11/13 CT abd/pelvis, no foreign body noted ? Overlap of bowel walls, neg for obstruction  11/14 Echo >EF 35-40%, small pericardial effusion  11/15 Arterial doppler RUE occlusion, per vasc 11/16- renal US, ascites, no hydro 11/17 ct head>>>max sinusitis, no acute 11/18 ct abdo/pelvis>>> 11/17 eeg>>>gen slowing c/w met enceph 11/18 CT Chest/Abd/Pelvis>> no abscess, small effusions, not loculated, infiltrates bilateral 11/19 TEE>no veg, clot svc, ra and on HD cath, 25%  SIGNIFICANT EVENTS: 11/13 Presenting with AMS, DKA and VDRF 11/16- remeain in shock, renal failure 11/16- cvvhd  SUBJECTIVE: neo dose remains, tee with clot extensive just back  VITAL SIGNS: Temp:  [97.6 F (36.4 C)-98.1 F (36.7 C)] 98.1 F (36.7 C) (11/19 0759) Pulse Rate:  [63-103] 81 (11/19 0700) Resp:  [23-35] 35 (11/19 0700) BP: (83-157)/(42-134) 103/66 mmHg (11/19 0700) SpO2:  [72 %-100 %] 97 % (11/19 0739) FiO2 (%):  [40 %] 40 % (11/19 0739) Weight:  [181 lb 7 oz (82.3 kg)] 181 lb 7 oz (82.3 kg) (11/19 0500) HEMODYNAMICS: CVP:  [8 mmHg-13 mmHg] 13 mmHg VENTILATOR SETTINGS: Vent Mode:  [-] PRVC FiO2 (%):  [40 %] 40 % Set Rate:  [35 bmp] 35 bmp Vt Set:  [440  mL] 440 mL PEEP:  [5 cmH20] 5 cmH20 Plateau Pressure:  [20 cmH20-24 cmH20] 20 cmH20 INTAKE / OUTPUT:  Intake/Output Summary (Last 24 hours) at 10/07/14 0810 Last data filed at 10/07/14 0700  Gross per 24 hour  Intake 5084.7 ml  Output   5722 ml  Net -637.3 ml    PHYSICAL EXAMINATION: General:  Chronically ill appearing female who appears older than stated age Neuro:  rass 0, alert, follows commands HEENT:  jvd flat Cardiovascular:  Regular, tachy, Nl S1/S2, -M/R/G. Lungs: rhonchi diffuse Abdomen:  Soft, NT, ND and +BS. Musculoskeletal:  2 +gen edema  RUE >hand mottled resolving , cool to touch , doppler pulse difficult Skin:  Thin but intact.  LABS:  CBC  Recent Labs Lab 10/04/14 0330 10/05/14 0400 10/06/14 0605  10/07/14 0113 10/07/14 0513 10/07/14 0519  WBC 26.7* 19.9* 12.7*  --   --   --   --   HGB 10.9* 10.3* 10.3*  < > 9.5* 9.5* 8.8*  HCT 31.9* 30.1* 30.9*  < > 28.0* 28.0* 26.0*  PLT 71* 57* 56*  --   --   --   --   < > = values in this interval not displayed. Coag's  Recent Labs Lab 10/04/14 1054 10/06/14 0605  INR 1.15 1.13   BMET  Recent Labs Lab 10/06/14 1528  10/06/14 2300  10/07/14 0500 10/07/14 0513 10/07/14 0519  NA 133*  < > 132*  < > 136*  133* 132*  K 4.0  < > 4.0  < > 3.9 3.7 3.7  CL 93*  --  89*  --  90*  --   --   CO2 27  --  29  --  33*  --   --   BUN 23  --  20  --  18  --   --   CREATININE 1.55*  --  1.46*  --  1.38*  --   --   GLUCOSE 204*  --  196*  --  275*  --   --   < > = values in this interval not displayed. Electrolytes  Recent Labs Lab 10/05/14 0400  10/06/14 0500 10/06/14 1528 10/06/14 2300 10/07/14 0500  CALCIUM 8.1*  < > 7.7* 8.7 8.9 8.7  MG 2.0  --  2.2  --   --  2.0  PHOS 2.1*  < > 1.9* 1.5* 1.5* 1.1*  < > = values in this interval not displayed. Sepsis Markers  Recent Labs Lab 10/01/14 1007 10/01/14 1650 10/03/14 0421  LATICACIDVEN 1.28 1.6 1.9   ABG  Recent Labs Lab 10/05/14 1109  10/06/14 1830 10/06/14 1920  PHART 7.318* 7.470* 7.431  PCO2ART 42.6 38.9 44.4  PO2ART 74.0* 35.0* 35.0*   Liver Enzymes  Recent Labs Lab 10/01/14 0950 10/04/14 0330  10/06/14 1528 10/06/14 2300 10/07/14 0500  AST 37 34  --   --   --   --   ALT 14 40*  --   --   --   --   ALKPHOS 121* 112  --   --   --   --   BILITOT 0.4 0.2*  --   --   --   --   ALBUMIN 2.4* 1.3*  < > 1.4* 1.3* 1.3*  < > = values in this interval not displayed. Cardiac Enzymes  Recent Labs Lab 10/01/14 0950 10/01/14 1649 10/02/14 0001  TROPONINI  --  <0.30 <0.30  PROBNP 2023.0*  --   --    Glucose  Recent Labs Lab 10/06/14 0812 10/06/14 1153 10/06/14 1534 10/06/14 1908 10/06/14 2350 10/07/14 0401  GLUCAP 164* 175* 173* 156* 173* 224*    Imaging Ct Abdomen Pelvis Wo Contrast  10/06/2014   CLINICAL DATA:  Penicillin counter for sepsis, looking for source  EXAM: CT CHEST WITHOUT CONTRAST  TECHNIQUE: Multidetector CT imaging of the chest was performed following the standard protocol without IV contrast.  COMPARISON:  10/01/2014  FINDINGS: The patient is intubated with tip of intubation tube above the carina. A right central line terminates just above the cavoatrial junction. There is extensive bilateral multifocal airspace consolidation. Multiple rounded areas of consolidation are seen throughout the upper lobes bilaterally. There are air bronchograms through many of these. The largest area of upper lobe consolidation is on the left, measuring 5 by cm. This is a relatively confluent area of consolidation. Both lower lobes show extensive deep tendon consolidation, worse when compared to the prior study. There are moderate bilateral pleural effusions which appear larger when compared to the prior study.  No significant adenopathy in the hilar or mediastinal regions. Minimal pericardial effusion. NG tube extends into the stomach.  Liver, spleen, pancreas, kidneys, gallbladder, and adrenal glands show no  acute findings.  Mild calcification of the abdominal aorta.  Nonobstructive gas pattern with normal stomach small bowel and large bowel except for stool retained throughout the large bowel. Rectum is distended moderately with stool.  Bladder is decompressed. Trace free  fluid in the pelvis. Heavily calcified 2.5 cm object left adnexal region likely a calcified uterine fibroid.  Significantly increased attenuation throughout the subcutaneous soft tissues consistent with anasarca.  No acute musculoskeletal findings.  IMPRESSION: Widespread multinodular extensive consolidation throughout both lungs. The extensive consolidation has increased when compared to the prior study, as has the size of moderate bilateral pleural effusions. Consolidation in the lower lobes and in the left upper lobe is relatively confluent in may represent pneumonia or aspiration pneumonitis. Rounded areas of consolidation seen throughout the bilateral upper lobes could potentially be related to septic emboli.  No acute abdominal abnormality although there is evidence of constipation with moderate fecal impaction in the rectum. There is also evidence of anasarca.   Electronically Signed   By: Skipper Cliche M.D.   On: 10/06/2014 17:33   Ct Chest Wo Contrast  10/06/2014   CLINICAL DATA:  Penicillin counter for sepsis, looking for source  EXAM: CT CHEST WITHOUT CONTRAST  TECHNIQUE: Multidetector CT imaging of the chest was performed following the standard protocol without IV contrast.  COMPARISON:  10/01/2014  FINDINGS: The patient is intubated with tip of intubation tube above the carina. A right central line terminates just above the cavoatrial junction. There is extensive bilateral multifocal airspace consolidation. Multiple rounded areas of consolidation are seen throughout the upper lobes bilaterally. There are air bronchograms through many of these. The largest area of upper lobe consolidation is on the left, measuring 5 by cm. This is a  relatively confluent area of consolidation. Both lower lobes show extensive deep tendon consolidation, worse when compared to the prior study. There are moderate bilateral pleural effusions which appear larger when compared to the prior study.  No significant adenopathy in the hilar or mediastinal regions. Minimal pericardial effusion. NG tube extends into the stomach.  Liver, spleen, pancreas, kidneys, gallbladder, and adrenal glands show no acute findings.  Mild calcification of the abdominal aorta.  Nonobstructive gas pattern with normal stomach small bowel and large bowel except for stool retained throughout the large bowel. Rectum is distended moderately with stool.  Bladder is decompressed. Trace free fluid in the pelvis. Heavily calcified 2.5 cm object left adnexal region likely a calcified uterine fibroid.  Significantly increased attenuation throughout the subcutaneous soft tissues consistent with anasarca.  No acute musculoskeletal findings.  IMPRESSION: Widespread multinodular extensive consolidation throughout both lungs. The extensive consolidation has increased when compared to the prior study, as has the size of moderate bilateral pleural effusions. Consolidation in the lower lobes and in the left upper lobe is relatively confluent in may represent pneumonia or aspiration pneumonitis. Rounded areas of consolidation seen throughout the bilateral upper lobes could potentially be related to septic emboli.  No acute abdominal abnormality although there is evidence of constipation with moderate fecal impaction in the rectum. There is also evidence of anasarca.   Electronically Signed   By: Skipper Cliche M.D.   On: 10/06/2014 17:33   Dg Chest Port 1 View  10/06/2014   CLINICAL DATA:  Central line placement.  EXAM: PORTABLE CHEST - 1 VIEW  COMPARISON:  10/06/2014 L5 09 a.m.  FINDINGS: New right internal jugular central venous line has its tip projecting in the lower superior vena cava. No pneumothorax.   Two lumen left internal jugular central venous catheter and right subclavian central venous catheter are stable. Endotracheal tube is stable with its tip projecting 2.7 cm above the carina. Nasogastric tube passes below the diaphragm into the stomach.  Bilateral patchy  areas of airspace opacity are unchanged. This may reflect asymmetric edema or multifocal pneumonia. The latter is suspected. More confluent opacity is noted in the left lung base, also stable. Small effusions are likely.  IMPRESSION: 1. New right internal jugular central venous line has its tip in the lower superior vena cava. No pneumothorax. 2. No other change from the earlier study. Persistent patchy areas of airspace opacity with more confluent left lung base opacity. Multifocal pneumonia is suspected.   Electronically Signed   By: Lajean Manes M.D.   On: 10/06/2014 15:15   Dg Chest Port 1 View  10/06/2014   SHOULDER CC CLINICAL DATA: 63 year old female acute respiratory failure, intubated. Initial encounter.  EXAM: PORTABLE CHEST - 1 VIEW  COMPARISON:  10/04/2014 and earlier.  FINDINGS: Portable AP semi upright view at 0509 hrs. Endotracheal tube tip now at the level the clavicles. Stable enteric tube, dual lumen left IJ catheter and single lumen right subclavian catheter. Stable cardiac size and mediastinal contours.  Mildly larger lung volumes. Continued dense retrocardiac opacity. Superimposed scattered bilateral patchy pulmonary opacity has mildly regressed since 10/04/2014. No pneumothorax or large effusion.  IMPRESSION: 1.  Stable lines and tubes. 2. Lower lobe collapse or consolidation. 3. Superimposed bilateral patchy pulmonary opacity suspicious for bilateral pneumonia has mildly regressed since 10/04/2014.   Electronically Signed   By: Lars Pinks M.D.   On: 10/06/2014 06:38     ASSESSMENT / PLAN:  PULMONARY OETT 11/13>>> A: Acute resp failure PNA, MSSA P:   - MV too high for wean unless improved after below changes -  keep same MV, remains on rate 35, will need follow up abg, reduce rate 26 abg to follow - F/U CXR in am for ali pattern>> RIJ in place, likely multifocal pneumonia - BD   CARDIOVASCULAR CVL L [place] TLC 11/13>>> A: Hypotension likely related to acidosis and sepsis. Neg troponins .  CHF >echo 11/14 w/ decreased EF  11/15>max pressors unable to wean, CVP 13  RUE/Hand with mottling  Accuracy cuff Clot 11/19 svc, ra, catheter P:  - cvp 15, cvvhd for volume off -follow pressor needs with above approach  - titrate pressors for MAP >60 -will place a line, need accuracy -Still no need steroids Dc hd cath See heme  RENAL A:   Acute renal failure, ATN Hypophos P:   -cvvhd planned -chem in am -phos supp kvo  GASTROINTESTINAL A:  CT neg dentures or source abscess seeding P: -  - NGT. -TF to goal held for tee -monitor BM  HEMATOLOGIC A:  Anemia, thrombocytopenia (sepsis), r/o HITT type 1  Concern now increased HITT 2 Extensive thrombous RA, svc  P:  -dc all hep products, start argatroban -dc hd catheter -scd -cbc in am Doppler all ext  INFECTIOUS A:  Likely aspiration PNA. Sepsis  MSSA bacteremia CONTINUED Pressors, TEE negative P:   BCx2 11/13>>>1/2 staph aureus > BC 11/17>>> UC 11/13>>> Sputum 11/13>>>GPC > Abx: Cefepime, start date 11/13>>> Vanc 11/14 > 11/16  Per ID Ancef 11/16>>>  TEE reviewed  ENDOCRINE A:  DM and DKA>BS tr down on Insulin drip  Cortisol high 11/13 , 11/18 P:   - lantus, ssi - CBGs. - repeat cortisol for burn out AI>> repeat 29.5, down from 61.6, no need roids  NEUROLOGIC A:  AMS due to DKA and acidosis, reassuring eeg, CT without sig anoxia P:   RASS goal: 0 - PRN versed and fentanyl drip -WUA  FAMILY  Husband updated in full  11/16, 11/19 - Updates: sister, long time significant other updated at bedside at length by me on 11/15    STAFF NOTE: I, Merrie Roof, MD FACP have personally reviewed patient's available data,  including medical history, events of note, physical examination and test results as part of my evaluation. I have discussed with resident/NP and other care providers such as pharmacist, RN and RRT. In addition, I personally evaluated patient and elicited key findings of: clot noted RA, svc, remain sin shock, course lungs, followed commands, add argatroban, dc hd cath, place a line, repeat BC neg The patient is critically ill with multiple organ systems failure and requires high complexity decision making for assessment and support, frequent evaluation and titration of therapies, application of advanced monitoring technologies and extensive interpretation of multiple databases.   Critical Care Time devoted to patient care services described in this note is 45 Minutes. This time reflects time of care of this signee: Merrie Roof, MD FACP. This critical care time does not reflect procedure time, or teaching time or supervisory time of PA/NP/Med student/Med Resident etc but could involve care discussion time. Rest per NP/medical resident whose note is outlined above and that I agree with   Lavon Paganini. Titus Mould, MD, [place] Pgr: [facility] 10/07/2014 1:01 PM

## 2014-10-07 NOTE — Procedures (Signed)
Arterial Catheter Insertion Procedure Note [name] [id number] 12-23-1950  Procedure: Insertion of Arterial Catheter  Indications: Blood pressure monitoring and Frequent blood sampling  Procedure Details Consent: Risks of procedure as well as the alternatives and risks of each were explained to the (patient/caregiver).  Consent for procedure obtained. Time Out: Verified patient identification, verified procedure, site/side was marked, verified correct patient position, special equipment/implants available, medications/allergies/relevent history reviewed, required imaging and test results available.  Performed  Maximum sterile technique was used including antiseptics, cap, gloves, gown, hand hygiene, mask and sheet. Skin prep: Chlorhexidine; local anesthetic administered 20 gauge catheter was inserted into left femoral artery using the Seldinger technique.  Evaluation Blood flow good; BP tracing good. Complications: No apparent complications.   [name],[name]. 10/07/2014  US guidance  [name]. [name], [name], [name] Pgr: [phone] [name]

## 2014-10-07 NOTE — Progress Notes (Signed)
    [facility] has been requested to perform a transesophageal echocardiogram on 11/19 for Staph Bacteremia.  After careful review of history and examination, the risks and benefits of transesophageal echocardiogram have been explained including risks of esophageal damage, perforation (1:10,000 risk), bleeding, pharyngeal hematoma as well as other potential complications associated with conscious sedation including aspiration, arrhythmia, respiratory failure and death. Of note, patient is currently intubated and sedated. Alternatives to treatment were discussed, questions were answered. Patient is willing to proceed.   Of note, the patient's platelet yesterday was 56. Dr. Eden EmmsNishan is aware.  Azalee CourseMeng, Tashaun Obey, PA-C 10/07/2014 12:11 PM

## 2014-10-07 NOTE — Progress Notes (Signed)
NUTRITION FOLLOW UP   Intervention:    To better meet re-estimated nutrition needs, change TF to Vital High Protein at 60 ml/h (1440 ml/day) to provide 1440 kcals, 126 gm protein, 1204 ml free water daily.  Nutrition Dx:   Inadequate oral intake related to inability to eat as evidenced by NPO status, ongoing.  Goal:   Intake to meet >90% of estimated nutrition needs. Unmet.  Monitor:   TF tolerance/adequacy, weight trend, labs, vent status.  Assessment:   63 year old female with PMH of diabetes and medical non-compliance.Husband woke up this AM and was unable to arouse patient. In the ED patient became unresponsive and was intubated. Pt presents with AMS, DKA, and VDRF.  Discussed patient in ICU rounds today. CRRT started on 11/16. Patient remains on CRRT at thist time. Patient is receiving Vital AF 1.2 at 55 ml/h (1320 ml per day) to provide 1584 kcals, 99 gm protein, 1071 ml free water daily. Patient with increased protein needs while receiving CRRT.  Patient is currently intubated on ventilator support MV: 14 L/min Temp (24hrs), Avg:97.8 F (36.6 C), Min:97.6 F (36.4 C), Max:98.1 F (36.7 C)   Height: Ht Readings from Last 1 Encounters:  10/02/14 5\' 4"  (1.626 m)    Weight Status:  Up with fluid retention Wt Readings from Last 1 Encounters:  10/07/14 181 lb 7 oz (82.3 kg)   10/01/14 144 lb 13.5 oz (65.7 kg)    Re-estimated needs:  Kcal: 1514 Protein: 110-130 gm Fluid: 1.6 L  Skin: WNL  Diet Order: Diet NPO time specified   Intake/Output Summary (Last 24 hours) at 10/07/14 1219 Last data filed at 10/07/14 1200  Gross per 24 hour  Intake 5600.66 ml  Output   6210 ml  Net -609.34 ml    Last BM: none documented since admission; 11/1   Labs:   Recent Labs Lab 10/05/14 0400  10/06/14 0500  10/06/14 1528  10/06/14 2300  10/07/14 0500  10/07/14 0712 10/07/14 0938 10/07/14 0950  NA 130*  < > 132*  < > 133*  < > 132*  < > 136*  < > 132* 134* 132*   K 3.9  < > 4.5  < > 4.0  < > 4.0  < > 3.9  < > 3.5* 3.6* 4.1  CL 97  < > 98  --  93*  --  89*  --  90*  --   --   --   --   CO2 19  < > 23  --  27  --  29  --  33*  --   --   --   --   BUN 37*  < > 26*  --  23  --  20  --  18  --   --   --   --   CREATININE 2.45*  < > 1.78*  --  1.55*  --  1.46*  --  1.38*  --   --   --   --   CALCIUM 8.1*  < > 7.7*  --  8.7  --  8.9  --  8.7  --   --   --   --   MG 2.0  --  2.2  --   --   --   --   --  2.0  --   --   --   --   PHOS 2.1*  < > 1.9*  --  1.5*  --  1.5*  --  1.1*  --   --   --   --   GLUCOSE 228*  < > 220*  --  204*  --  196*  --  275*  --   --   --   --   < > = values in this interval not displayed.  CBG (last 3)   Recent Labs  10/06/14 2350 10/07/14 0401 10/07/14 0757  GLUCAP 173* 224* 201*    Scheduled Meds: . antiseptic oral rinse  7 mL Mouth Rinse QID  .  ceFAZolin (ANCEF) IV  2 g Intravenous Q12H  . chlorhexidine  15 mL Mouth Rinse BID  . docusate  50 mg Per Tube Daily  . insulin aspart  0-15 Units Subcutaneous 6 times per day  . insulin glargine  10 Units Subcutaneous Daily  . ipratropium-albuterol  3 mL Nebulization Q6H  . pantoprazole (PROTONIX) IV  40 mg Intravenous Q24H  . polyethylene glycol  17 g Per Tube Daily  . potassium phosphate IVPB (mmol)  30 mmol Intravenous Once    Continuous Infusions: . sodium chloride Stopped (10/03/14 1507)  . calcium gluconate infusion for CRRT 20 g (10/07/14 1200)  . feeding supplement (VITAL AF 1.2 CAL) 1,000 mL (10/07/14 1219)  . fentaNYL infusion INTRAVENOUS 100 mcg/hr (10/06/14 1700)  . norepinephrine (LEVOPHED) Adult infusion Stopped (10/04/14 0645)  . phenylephrine (NEO-SYNEPHRINE) Adult infusion 75 mcg/min (10/07/14 1054)  . dialysis replacement fluid (prismasate)    . dialysate (PRISMASATE) 1,500 mL/hr at 10/07/14 1100  . sodium citrate 2 %/dextrose 2.5% solution 3000 mL 460 mL/hr at 10/07/14 1151  . vasopressin (PITRESSIN) infusion - *FOR SHOCK* 0.03 Units/min (10/07/14  1100)    Joaquin CourtsKimberly Harris, RD, LDN, CNSC Pager [phone] After Hours Pager [phone]

## 2014-10-07 NOTE — Consult Note (Signed)
Right hand warmer still with dusky areas digits 1 2 3   On less Neo, still on Vasopressin Right 2nd digit tip demarcating Continue to wean pressors as tolerated   Fabienne Brunsharles Fields, MD [facility] Office: [phone] Pager: [phone]

## 2014-10-07 NOTE — Progress Notes (Signed)
  Echocardiogram Echocardiogram Transesophageal has been performed.  [name], [name] 10/07/2014, 1:29 PM

## 2014-10-07 NOTE — Progress Notes (Signed)
eLink Physician-Brief Progress Note Patient Name: Narda RutherfordDeborah A Linehan DOB: [birth year] MRN: [id number]   Date of Service  10/07/2014  HPI/Events of Note  abg results (see EMR)  eICU Interventions  Dec rate and inc fiO2     Intervention Category Intermediate Interventions: Other:  Mairely Foxworth 10/07/2014, 7:00 PM

## 2014-10-07 NOTE — CV Procedure (Signed)
TEE:  R/O SBE  On fentanyl drip with versed bolus  LVE EF 35-40% diffuse hypokinesis No SBE valves ok  Mild AR, Mild MR Mild TR, Mild PR Entire SVC filled with thrombus that extends into RA.  Appears to be wrapped around The dual lumen dialysis catheter No proximal MPA/Left or Right pulmonary artery thrombus RV normal size and function No LAA thrombus No ASD/PFO Mobile debris/thrombus in descending thoracic aorta with no disection  Abigail HawsPeter Amarri Zamora

## 2014-10-08 DIAGNOSIS — R609 Edema, unspecified: Secondary | ICD-10-CM

## 2014-10-08 DIAGNOSIS — D696 Thrombocytopenia, unspecified: Secondary | ICD-10-CM

## 2014-10-08 DIAGNOSIS — J9600 Acute respiratory failure, unspecified whether with hypoxia or hypercapnia: Secondary | ICD-10-CM | POA: Insufficient documentation

## 2014-10-08 LAB — POCT I-STAT EG7
ACID-BASE EXCESS: 17 mmol/L — AB (ref 0.0–2.0)
Acid-Base Excess: 17 mmol/L — ABNORMAL HIGH (ref 0.0–2.0)
Acid-Base Excess: 19 mmol/L — ABNORMAL HIGH (ref 0.0–2.0)
Acid-Base Excess: 19 mmol/L — ABNORMAL HIGH (ref 0.0–2.0)
BICARBONATE: 43.7 meq/L — AB (ref 20.0–24.0)
BICARBONATE: 44.8 meq/L — AB (ref 20.0–24.0)
Bicarbonate: 43.6 mEq/L — ABNORMAL HIGH (ref 20.0–24.0)
Bicarbonate: 45.4 mEq/L — ABNORMAL HIGH (ref 20.0–24.0)
CALCIUM ION: 0.85 mmol/L — AB (ref 1.13–1.30)
CALCIUM ION: 1.05 mmol/L — AB (ref 1.13–1.30)
Calcium, Ion: 0.8 mmol/L — ABNORMAL LOW (ref 1.13–1.30)
Calcium, Ion: 0.92 mmol/L — ABNORMAL LOW (ref 1.13–1.30)
HCT: 29 % — ABNORMAL LOW (ref 36.0–46.0)
HEMATOCRIT: 29 % — AB (ref 36.0–46.0)
HEMATOCRIT: 30 % — AB (ref 36.0–46.0)
HEMATOCRIT: 30 % — AB (ref 36.0–46.0)
HEMOGLOBIN: 10.2 g/dL — AB (ref 12.0–15.0)
HEMOGLOBIN: 9.9 g/dL — AB (ref 12.0–15.0)
HEMOGLOBIN: 9.9 g/dL — AB (ref 12.0–15.0)
Hemoglobin: 10.2 g/dL — ABNORMAL LOW (ref 12.0–15.0)
O2 Saturation: 54 %
O2 Saturation: 55 %
O2 Saturation: 60 %
O2 Saturation: 68 %
PCO2 VEN: 60.6 mmHg — AB (ref 45.0–50.0)
PH VEN: 7.441 — AB (ref 7.250–7.300)
PH VEN: 7.443 — AB (ref 7.250–7.300)
PH VEN: 7.471 — AB (ref 7.250–7.300)
PH VEN: 7.48 — AB (ref 7.250–7.300)
PO2 VEN: 30 mmHg (ref 30.0–45.0)
POTASSIUM: 3.6 meq/L — AB (ref 3.7–5.3)
Patient temperature: 97.4
Patient temperature: 97.4
Patient temperature: 97.4
Patient temperature: 98.4
Potassium: 3.6 mEq/L — ABNORMAL LOW (ref 3.7–5.3)
Potassium: 3.6 mEq/L — ABNORMAL LOW (ref 3.7–5.3)
Potassium: 3.7 mEq/L (ref 3.7–5.3)
SODIUM: 134 meq/L — AB (ref 137–147)
SODIUM: 135 meq/L — AB (ref 137–147)
SODIUM: 135 meq/L — AB (ref 137–147)
Sodium: 134 mEq/L — ABNORMAL LOW (ref 137–147)
TCO2: 46 mmol/L (ref 0–100)
TCO2: 46 mmol/L (ref 0–100)
TCO2: 47 mmol/L (ref 0–100)
TCO2: 47 mmol/L (ref 0–100)
pCO2, Ven: 61.3 mmHg — ABNORMAL HIGH (ref 45.0–50.0)
pCO2, Ven: 63.4 mmHg — ABNORMAL HIGH (ref 45.0–50.0)
pCO2, Ven: 63.9 mmHg — ABNORMAL HIGH (ref 45.0–50.0)
pO2, Ven: 27 mmHg — CL (ref 30.0–45.0)
pO2, Ven: 28 mmHg — CL (ref 30.0–45.0)
pO2, Ven: 35 mmHg (ref 30.0–45.0)

## 2014-10-08 LAB — POCT I-STAT 3, ART BLOOD GAS (G3+)
ACID-BASE EXCESS: 18 mmol/L — AB (ref 0.0–2.0)
Acid-Base Excess: 22 mmol/L — ABNORMAL HIGH (ref 0.0–2.0)
Bicarbonate: 47.4 mEq/L — ABNORMAL HIGH (ref 20.0–24.0)
Bicarbonate: 43.7 mEq/L — ABNORMAL HIGH (ref 20.0–24.0)
O2 Saturation: 99 %
O2 Saturation: 97 %
PCO2 ART: 51.9 mmHg — AB (ref 35.0–45.0)
PH ART: 7.566 — AB (ref 7.350–7.450)
PH ART: 7.511 — AB (ref 7.350–7.450)
PO2 ART: 124 mmHg — AB (ref 80.0–100.0)
Patient temperature: 97.4
Patient temperature: 97
TCO2: 49 mmol/L (ref 0–100)
TCO2: 45 mmol/L (ref 0–100)
pCO2 arterial: 54.3 mmHg — ABNORMAL HIGH (ref 35.0–45.0)
pO2, Arterial: 79 mmHg — ABNORMAL LOW (ref 80.0–100.0)

## 2014-10-08 LAB — POCT I-STAT 7, (LYTES, BLD GAS, ICA,H+H)
ACID-BASE EXCESS: 22 mmol/L — AB (ref 0.0–2.0)
ACID-BASE EXCESS: 23 mmol/L — AB (ref 0.0–2.0)
ACID-BASE EXCESS: 24 mmol/L — AB (ref 0.0–2.0)
Acid-Base Excess: 20 mmol/L — ABNORMAL HIGH (ref 0.0–2.0)
BICARBONATE: 47.8 meq/L — AB (ref 20.0–24.0)
BICARBONATE: 48.8 meq/L — AB (ref 20.0–24.0)
Bicarbonate: 47.3 mEq/L — ABNORMAL HIGH (ref 20.0–24.0)
Bicarbonate: 45.2 mEq/L — ABNORMAL HIGH (ref 20.0–24.0)
CALCIUM ION: 1.29 mmol/L (ref 1.13–1.30)
Calcium, Ion: 1.24 mmol/L (ref 1.13–1.30)
Calcium, Ion: 1.3 mmol/L (ref 1.13–1.30)
Calcium, Ion: 1.33 mmol/L — ABNORMAL HIGH (ref 1.13–1.30)
HCT: 28 % — ABNORMAL LOW (ref 36.0–46.0)
HCT: 28 % — ABNORMAL LOW (ref 36.0–46.0)
HEMATOCRIT: 27 % — AB (ref 36.0–46.0)
HEMATOCRIT: 29 % — AB (ref 36.0–46.0)
HEMOGLOBIN: 9.5 g/dL — AB (ref 12.0–15.0)
Hemoglobin: 9.2 g/dL — ABNORMAL LOW (ref 12.0–15.0)
Hemoglobin: 9.5 g/dL — ABNORMAL LOW (ref 12.0–15.0)
Hemoglobin: 9.9 g/dL — ABNORMAL LOW (ref 12.0–15.0)
O2 SAT: 99 %
O2 Saturation: 99 %
O2 Saturation: 99 %
O2 Saturation: 98 %
PCO2 ART: 53.5 mmHg — AB (ref 35.0–45.0)
PO2 ART: 140 mmHg — AB (ref 80.0–100.0)
POTASSIUM: 4 meq/L (ref 3.7–5.3)
Patient temperature: 97.4
Patient temperature: 97.4
Patient temperature: 97.4
Patient temperature: 97.6
Potassium: 3.6 mEq/L — ABNORMAL LOW (ref 3.7–5.3)
Potassium: 3.6 mEq/L — ABNORMAL LOW (ref 3.7–5.3)
Potassium: 3.6 mEq/L — ABNORMAL LOW (ref 3.7–5.3)
SODIUM: 133 meq/L — AB (ref 137–147)
SODIUM: 134 meq/L — AB (ref 137–147)
Sodium: 133 mEq/L — ABNORMAL LOW (ref 137–147)
Sodium: 134 mEq/L — ABNORMAL LOW (ref 137–147)
TCO2: 49 mmol/L (ref 0–100)
TCO2: 49 mmol/L (ref 0–100)
TCO2: >50 mmol/L (ref 0–100)
TCO2: 47 mmol/L (ref 0–100)
pCO2 arterial: 49.1 mmHg — ABNORMAL HIGH (ref 35.0–45.0)
pCO2 arterial: 53.8 mmHg — ABNORMAL HIGH (ref 35.0–45.0)
pCO2 arterial: 52.9 mmHg — ABNORMAL HIGH (ref 35.0–45.0)
pH, Arterial: 7.553 — ABNORMAL HIGH (ref 7.350–7.450)
pH, Arterial: 7.554 — ABNORMAL HIGH (ref 7.350–7.450)
pH, Arterial: 7.603 (ref 7.350–7.450)
pH, Arterial: 7.538 — ABNORMAL HIGH (ref 7.350–7.450)
pO2, Arterial: 105 mmHg — ABNORMAL HIGH (ref 80.0–100.0)
pO2, Arterial: 117 mmHg — ABNORMAL HIGH (ref 80.0–100.0)
pO2, Arterial: 97 mmHg (ref 80.0–100.0)

## 2014-10-08 LAB — CBC
HCT: 28.9 % — ABNORMAL LOW (ref 36.0–46.0)
Hemoglobin: 9.7 g/dL — ABNORMAL LOW (ref 12.0–15.0)
MCH: 27 pg (ref 26.0–34.0)
MCHC: 33.6 g/dL (ref 30.0–36.0)
MCV: 80.5 fL (ref 78.0–100.0)
Platelets: 37 10*3/uL — ABNORMAL LOW (ref 150–400)
RBC: 3.59 MIL/uL — ABNORMAL LOW (ref 3.87–5.11)
RDW: 16.1 % — AB (ref 11.5–15.5)
WBC: 5.6 10*3/uL (ref 4.0–10.5)

## 2014-10-08 LAB — RENAL FUNCTION PANEL
Albumin: 1.3 g/dL — ABNORMAL LOW (ref 3.5–5.2)
Albumin: 1.4 g/dL — ABNORMAL LOW (ref 3.5–5.2)
Anion gap: 11 (ref 5–15)
Anion gap: 8 (ref 5–15)
BUN: 18 mg/dL (ref 6–23)
BUN: 20 mg/dL (ref 6–23)
CALCIUM: 10.9 mg/dL — AB (ref 8.4–10.5)
CALCIUM: 9.3 mg/dL (ref 8.4–10.5)
CO2: 39 meq/L — AB (ref 19–32)
CO2: 36 meq/L — AB (ref 19–32)
CREATININE: 1.21 mg/dL — AB (ref 0.50–1.10)
Chloride: 89 mEq/L — ABNORMAL LOW (ref 96–112)
Chloride: 93 mEq/L — ABNORMAL LOW (ref 96–112)
Creatinine, Ser: 1.21 mg/dL — ABNORMAL HIGH (ref 0.50–1.10)
GFR calc Af Amer: 55 mL/min — ABNORMAL LOW (ref >90)
GFR calc non Af Amer: 47 mL/min — ABNORMAL LOW (ref >90)
GFR calc non Af Amer: 47 mL/min — ABNORMAL LOW (ref >90)
GFR, EST AFRICAN AMERICAN: 55 mL/min — AB (ref >90)
GLUCOSE: 173 mg/dL — AB (ref 70–99)
Glucose, Bld: 154 mg/dL — ABNORMAL HIGH (ref 70–99)
PHOSPHORUS: 2.7 mg/dL (ref 2.3–4.6)
Phosphorus: 2.4 mg/dL (ref 2.3–4.6)
Potassium: 3.8 mEq/L (ref 3.7–5.3)
Potassium: 4.7 mEq/L (ref 3.7–5.3)
SODIUM: 139 meq/L (ref 137–147)
Sodium: 137 mEq/L (ref 137–147)

## 2014-10-08 LAB — APTT
APTT: 54 s — AB (ref 24–37)
APTT: 55 s — AB (ref 24–37)
APTT: 57 s — AB (ref 24–37)
aPTT: 64 seconds — ABNORMAL HIGH (ref 24–37)
aPTT: 67 seconds — ABNORMAL HIGH (ref 24–37)

## 2014-10-08 LAB — GLUCOSE, CAPILLARY
GLUCOSE-CAPILLARY: 101 mg/dL — AB (ref 70–99)
GLUCOSE-CAPILLARY: 130 mg/dL — AB (ref 70–99)
GLUCOSE-CAPILLARY: 152 mg/dL — AB (ref 70–99)
GLUCOSE-CAPILLARY: 69 mg/dL — AB (ref 70–99)
Glucose-Capillary: 117 mg/dL — ABNORMAL HIGH (ref 70–99)
Glucose-Capillary: 144 mg/dL — ABNORMAL HIGH (ref 70–99)
Glucose-Capillary: 121 mg/dL — ABNORMAL HIGH (ref 70–99)
Glucose-Capillary: 145 mg/dL — ABNORMAL HIGH (ref 70–99)

## 2014-10-08 LAB — D-DIMER, QUANTITATIVE (NOT AT ARMC): D DIMER QUANT: 7.53 ug{FEU}/mL — AB (ref 0.00–0.48)

## 2014-10-08 LAB — FIBRINOGEN: FIBRINOGEN: 707 mg/dL — AB (ref 204–475)

## 2014-10-08 LAB — PROCALCITONIN: Procalcitonin: 3.43 ng/mL

## 2014-10-08 LAB — MAGNESIUM: MAGNESIUM: 2 mg/dL (ref 1.5–2.5)

## 2014-10-08 LAB — CALCIUM, IONIZED: Calcium, Ion: 1.34 mmol/L — ABNORMAL HIGH (ref 1.13–1.30)

## 2014-10-08 MED ORDER — PRISMASOL BGK 4/2.5 32-4-2.5 MEQ/L IV SOLN
INTRAVENOUS | Status: DC
  Administered 2014-10-08 – 2014-10-09 (×8): via INTRAVENOUS_CENTRAL
  Filled 2014-10-08 (×20): qty 5000

## 2014-10-08 MED ORDER — INSULIN GLARGINE 100 UNIT/ML ~~LOC~~ SOLN
5.0000 [IU] | Freq: Every day | SUBCUTANEOUS | Status: DC
  Administered 2014-10-09 – 2014-10-10 (×2): 5 [IU] via SUBCUTANEOUS
  Filled 2014-10-08 (×3): qty 0.05

## 2014-10-08 MED ORDER — SODIUM CHLORIDE 0.9 % IV SOLN: INTRAVENOUS | Status: DC | PRN

## 2014-10-08 MED ORDER — PRISMASOL BGK 4/2.5 32-4-2.5 MEQ/L IV SOLN
INTRAVENOUS | Status: DC
  Filled 2014-10-08 (×3): qty 5000

## 2014-10-08 MED ORDER — SODIUM CHLORIDE 0.9 % FOR CRRT
INTRAVENOUS_CENTRAL | Status: DC | PRN
  Filled 2014-10-08: qty 1000

## 2014-10-08 MED ORDER — PRISMASOL BGK 4/2.5 32-4-2.5 MEQ/L IV SOLN
INTRAVENOUS | Status: DC
  Administered 2014-10-08 – 2014-10-09 (×3): via INTRAVENOUS_CENTRAL
  Filled 2014-10-08 (×5): qty 5000

## 2014-10-08 MED ORDER — DIPHENHYDRAMINE HCL 50 MG/ML IJ SOLN
25.0000 mg | Freq: Once | INTRAMUSCULAR | Status: AC
  Administered 2014-10-08: 25 mg via INTRAVENOUS
  Filled 2014-10-08: qty 1

## 2014-10-08 MED ORDER — ARGATROBAN 50 MG/50ML IV SOLN
45.0000 ug/min | INTRAVENOUS | Status: DC
  Administered 2014-10-08: 25 ug/min via INTRAVENOUS
  Administered 2014-10-09 – 2014-10-10 (×2): 40 ug/min via INTRAVENOUS
  Administered 2014-10-11 – 2014-10-13 (×4): 45 ug/min via INTRAVENOUS
  Filled 2014-10-08 (×7): qty 50

## 2014-10-08 NOTE — Progress Notes (Signed)
Hypoglycemic Event  CBG: 69  Treatment: 15 GM carbohydrate snack  Symptoms: None  Follow-up CBG: Time:0758  CBG Result:117  Possible Reasons for Event: Unknown  Comments/MD notified:Dr Sherril CongFeinstein    Inesha Sow O  Remember to initiate Hypoglycemia Order Set & complete

## 2014-10-08 NOTE — Consult Note (Signed)
Off pressors Right hand continues to improve 2nd digit tip still dusky Now has monophasic radial doppler flow and bi to triphasic ulnar flow Continue to protect hand Will recheck Monday.  Agree with argatroban for right arm DVT Dr Edilia Boickson on call this weekend if needed  Abigail Brunsharles Angely Dietz, MD [facility] [place] Office: [phone] Pager: [phone]

## 2014-10-08 NOTE — Progress Notes (Signed)
ANTICOAGULATION CONSULT NOTE - Follow up  Pharmacy Consult for argatroban Indication: SVC thrombus w/ thrombocytopenia  Allergies  Allergen Reactions  . Penicillins     Rash     Patient Measurements: Height: 5\' 4"  (162.6 cm) Weight: 175 lb 7.8 oz (79.6 kg) IBW/kg (Calculated) : 54.7 Heparin Dosing Weight:   Vital Signs: Temp: 97 F (36.1 C) (11/20 1157) Temp Source: Oral (11/20 1157) BP: 146/68 mmHg (11/20 1156) Pulse Rate: 115 (11/20 1156)  Labs:  Recent Labs  10/06/14 0605  10/07/14 0500  10/07/14 1625  10/07/14 1850 10/07/14 2100  10/08/14 0410 10/08/14 0414 10/08/14 0500 10/08/14 0813  HGB 10.3*  < >  --   < >  --   < >  --   --   < > 9.5*  --  9.7* 9.5*  HCT 30.9*  < >  --   < >  --   < >  --   --   < > 28.0*  --  28.9* 28.0*  PLT 56*  --   --   --   --   --   --   --   --   --   --  37*  --   APTT  --   --   --   --   --   --  52* 53*  --   --  54*  --   --   LABPROT 14.7  --   --   --   --   --   --   --   --   --   --   --   --   INR 1.13  --   --   --   --   --   --   --   --   --   --   --   --   CREATININE  --   < > 1.38*  --  1.31*  --   --   --   --   --  1.21*  --   --   < > = values in this interval not displayed.  Estimated Creatinine Clearance: 49.9 mL/min (by C-G formula based on Cr of 1.21).   Medical History: Past Medical History  Diagnosis Date  . Mixed stress and urge urinary incontinence   . Schizophrenia   . Allergy     Medications:  See EMR  Assessment: 63 yo female with clot in superior vena cava extending to the R atrium and multiple other clots in arm on doppler. Pt is in acute renal failure on CRRT.  Pt received heparin at prophylaxis doses and heparin with CRRT from 11/13 through 11/18, when all heparin products were stopped due to declining platelets.  Plts 252 > 56. 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 aPTTs have been 52-54 on lower end of goal, and with more clots found  on doppler today, increasing dose.   Goal of Therapy:  aPTT 50-90 seconds Monitor platelets by anticoagulation protocol: Yes   Plan:  Increase argatroban to 25 mcg/min Check aPTT in 2 hours (1400), until 2 consecutive therapeutic levels have been reached Monitor for s/sx bleeding F/u HIT panel Consider bridge to warfarin when platelets >150 or back at baseline    Gillermo Murdochuhani Alam, PharmD Candidate  10/08/2014 12:31 PM

## 2014-10-08 NOTE — Progress Notes (Signed)
ANTIBIOTIC CONSULT NOTE - Follow Up  Pharmacy Consult for Cefazolin Indication: MSSA bacteremia    Allergies  Allergen Reactions  . Penicillins     Rash     Patient Measurements: Height: 5\' 4"  (162.6 cm) Weight: 175 lb 7.8 oz (79.6 kg) IBW/kg (Calculated) : 54.7   Vital Signs: Temp: 97 F (36.1 C) (11/20 1157) Temp Source: Oral (11/20 1157) BP: 146/68 mmHg (11/20 1156) Pulse Rate: 115 (11/20 1156) Intake/Output from previous day: 11/19 0701 - 11/20 0700 In: 4251.5 [I.V.:2482.7; NG/GT:1222.5; IV Piggyback:468.3] Out: 5997  Intake/Output from this shift: Total I/O In: 609.9 [I.V.:249.9; NG/GT:360] Out: 1108 [Other:1108]  Labs:  Recent Labs  10/06/14 0605  10/07/14 0500  10/07/14 1625  10/08/14 0410 10/08/14 0414 10/08/14 0500 10/08/14 0813  WBC 12.7*  --   --   --   --   --   --   --  5.6  --   HGB 10.3*  < >  --   < >  --   < > 9.5*  --  9.7* 9.5*  PLT 56*  --   --   --   --   --   --   --  37*  --   CREATININE  --   < > 1.38*  --  1.31*  --   --  1.21*  --   --   < > = values in this interval not displayed. Estimated Creatinine Clearance: 49.9 mL/min (by C-G formula based on Cr of 1.21). No results for input(s): VANCOTROUGH, VANCOPEAK, VANCORANDOM, GENTTROUGH, GENTPEAK, GENTRANDOM, TOBRATROUGH, TOBRAPEAK, TOBRARND, AMIKACINPEAK, AMIKACINTROU, AMIKACIN in the last 72 hours.   Microbiology: Recent Results (from the past 720 hour(s))  Blood culture (routine x 2)     Status: None   Collection Time: 10/01/14  9:50 AM  Result Value Ref Range Status   Specimen Description BLOOD FOREARM LEFT  Final   Special Requests BOTTLES DRAWN AEROBIC AND ANAEROBIC 5CC  Final   Culture  Setup Time   Final    10/01/2014 14:50 Performed at [facility]    Culture   Final    STAPHYLOCOCCUS AUREUS Note: RIFAMPIN AND GENTAMICIN SHOULD NOT BE USED AS SINGLE DRUGS FOR TREATMENT OF STAPH INFECTIONS. Note: Gram Stain Report Called to,Read Back By and Verified With: RITA  BROWN 10/02/14 @ 11:34AM BY RUSCOE A. Performed at [facility]    Report Status 10/04/2014 FINAL  Final   Organism ID, Bacteria STAPHYLOCOCCUS AUREUS  Final      Susceptibility   Staphylococcus aureus - MIC*    CLINDAMYCIN <=0.25 SENSITIVE Sensitive     ERYTHROMYCIN <=0.25 SENSITIVE Sensitive     GENTAMICIN <=0.5 SENSITIVE Sensitive     LEVOFLOXACIN 0.25 SENSITIVE Sensitive     OXACILLIN 0.5 SENSITIVE Sensitive     PENICILLIN SENSITIVE      RIFAMPIN <=0.5 SENSITIVE Sensitive     TRIMETH/SULFA <=10 SENSITIVE Sensitive     VANCOMYCIN 1 SENSITIVE Sensitive     TETRACYCLINE <=1 SENSITIVE Sensitive     MOXIFLOXACIN <=0.25 SENSITIVE Sensitive     * STAPHYLOCOCCUS AUREUS  Blood culture (routine x 2)     Status: None   Collection Time: 10/01/14 10:00 AM  Result Value Ref Range Status   Specimen Description BLOOD HAND LEFT  Final   Special Requests BOTTLES DRAWN AEROBIC AND ANAEROBIC  Final   Culture  Setup Time   Final    10/01/2014 14:52 Performed at [facility]  Culture   Final    NO GROWTH 5 DAYS [facility]    Report Status 10/07/2014 FINAL  Final  Urine culture     Status: None   Collection Time: 10/01/14 10:13 AM  Result Value Ref Range Status   Specimen Description URINE, CATHETERIZED  Final   Special Requests NONE  Final   Culture  Setup Time   Final    10/01/2014 16:36 [facility]formed at [facility]    Colony Count NO GROWTH [facility]   Final   Culture NO GROWTH [facility]   Final   Report Status 10/02/2014 FINAL  Final  Culture, respiratory (NON-Expectorated)     Status: None   Collection Time: 10/01/14 12:50 PM  Result Value Ref Range Status   Specimen Description ENDOTRACHEAL  Final   Special Requests NONE  Final   Gram Stain   Final    MODERATE WBC PRESENT,BOTH PMN AND MONONUCLEAR RARE SQUAMOUS EPITHELIAL CELLS PRESENT ABUNDANT GRAM POSITIVE COCCI IN PAIRS IN  CLUSTERS [facility]formed at [facility]    Culture   Final    ABUNDANT STAPHYLOCOCCUS AUREUS Note: RIFAMPIN AND GENTAMICIN SHOULD NOT BE USED AS SINGLE DRUGS FOR TREATMENT OF STAPH INFECTIONS. ABUNDANT STREPTOCOCCUS,BETA HEMOLYTIC NOT GROUP A Note: TESTING AGAINST S. AGALACTIAE NOT ROUTINELY PERFORMED DUE TO PREDICTABILITY OF AMP/PEN/VAN SUSCEPTIBILITY. Performed at [facility]    Report Status 10/04/2014 FINAL  Final   Organism ID, Bacteria STAPHYLOCOCCUS AUREUS  Final      Susceptibility   Staphylococcus aureus - MIC*    CLINDAMYCIN <=0.25 SENSITIVE Sensitive     ERYTHROMYCIN <=0.25 SENSITIVE Sensitive     GENTAMICIN <=0.5 SENSITIVE Sensitive     LEVOFLOXACIN 0.25 SENSITIVE Sensitive     OXACILLIN 0.5 SENSITIVE Sensitive     PENICILLIN 0.06 SENSITIVE Sensitive     RIFAMPIN <=0.5 SENSITIVE Sensitive     TRIMETH/SULFA <=10 SENSITIVE Sensitive     VANCOMYCIN <=0.5 SENSITIVE Sensitive     TETRACYCLINE <=1 SENSITIVE Sensitive     MOXIFLOXACIN <=0.25 SENSITIVE Sensitive     * ABUNDANT STAPHYLOCOCCUS AUREUS  MRSA PCR Screening     Status: None   Collection Time: 10/01/14  4:04 PM  Result Value Ref Range Status   MRSA by PCR NEGATIVE NEGATIVE Final    Comment:        The GeneXpert MRSA Assay (FDA approved for NASAL specimens only), is one component of a comprehensive MRSA colonization surveillance program. It is not intended to diagnose MRSA infection nor to guide or monitor treatment for MRSA infections.   Urine culture     Status: None   Collection Time: 10/02/14  3:15 AM  Result Value Ref Range Status   Specimen Description URINE, RANDOM  Final   Special Requests NONE  Final   Culture  Setup Time   Final    10/02/2014 10:55 Performed at [facility]    Colony Count NO GROWTH [facility] at [facility]   Final   Culture NO GROWTH [facility] at [facility]   Final   Report Status 10/03/2014 FINAL  Final  Culture, blood (routine x  2)     Status: None (Preliminary result)   Collection Time: 10/05/14  8:10 PM  Result Value Ref Range Status   Specimen Description BLOOD LEFT FOOT  Final   Special Requests BOTTLES DRAWN AEROBIC ONLY 1CC  Final   Culture  Setup Time   Final  10/06/2014 00:46 Performed at [facility]    Culture   Final           BLOOD CULTURE RECEIVED NO GROWTH TO DATE CULTURE WILL BE HELD FOR 5 DAYS BEFORE ISSUING A FINAL NEGATIVE REPORT Performed at [facility]    Report Status PENDING  Incomplete  Culture, blood (routine x 2)     Status: None (Preliminary result)   Collection Time: 10/05/14  8:16 PM  Result Value Ref Range Status   Specimen Description BLOOD LEFT ARM  Final   Special Requests BOTTLES DRAWN AEROBIC ONLY 1CC  Final   Culture  Setup Time   Final    10/06/2014 00:46 Performed at [facility]    Culture   Final           BLOOD CULTURE RECEIVED NO GROWTH TO DATE CULTURE WILL BE HELD FOR 5 DAYS BEFORE ISSUING A FINAL NEGATIVE REPORT Performed at [facility]    Report Status PENDING  Incomplete  Culture, blood (single)     Status: None (Preliminary result)   Collection Time: 10/07/14  2:58 PM  Result Value Ref Range Status   Specimen Description BLOOD CENTRAL LINE  Final   Special Requests BOTTLES DRAWN AEROBIC AND ANAEROBIC 10MLS  Final   Culture  Setup Time   Final    10/07/2014 22:24 Performed at [facility]    Culture   Final           BLOOD CULTURE RECEIVED NO GROWTH TO DATE CULTURE WILL BE HELD FOR 5 DAYS BEFORE ISSUING A FINAL NEGATIVE REPORT Performed at [facility]    Report Status PENDING  Incomplete    Assessment: 63 YOF brought into the [facility] with DKA requiring intubation by CCM to protect airway. Now pt with MSSA bacteremia. Afebrile, WBC elevated wnl  SCr 1.21, estimate CrCl ~50 ml/min. On CRRT since 11/16  11/13 Cefepime >> 11/16 11/14 Vancomycin >> 11/16  11/16 Cefazolin >>   11/19 Bld>>NGTD 11/17  Bld x2>>NGTD 11/13 Bld x2>> MSSA (1/2) 11/13 Endotrach>> MSSA 11/14 Urine>> NGF MRSA PCR neg  Goal of Therapy:  Resolution of Infection   Plan:  1. Continue Cefazolin 2 g Q 12 (CRRT dosing) 2. Will f/u micro data, pt's clinical condition, renal function 3. F/u plans for CRRT - expected to stop on 11/21 and adjust Cefazolin dose as indicated  Gillermo Murdochuhani Alam, PharmD Candidate  10/08/14

## 2014-10-08 NOTE — Progress Notes (Signed)
Dr. Lowell GuitarPowell Notified about Alkalosis. Order given to decrease citrate from 490 to 200 and not to titrate above 200.

## 2014-10-08 NOTE — Progress Notes (Addendum)
Bilateral lower and upper extremity venous duplex completed.  Bilateral upper extremity:  DVT noted in the right axillary vein.  No obvious evidence of DVT in the visualized veins of the left upper extremity.  Superficial thrombosis noted in the bilateral cephalic vein.  Right jugular vein not visualized due to line in the neck.  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  Left basilic vein not visualized due to position of the left upper extremity.  Bilateral lower extremity:  No obvious evidence of DVT in the visualized veins or Baker's cyst.  There appears to be superficial thrombosis noted in the bilateral greater saphenous vein.  Left common femoral and profunda veins are not visualized to hemodialysis access.  Technically difficult study due to edema and the patient's immobility.

## 2014-10-08 NOTE — Progress Notes (Addendum)
ANTICOAGULATION CONSULT NOTE - Follow up  Pharmacy Consult for argatroban Indication: SVC thrombus w/ thrombocytopenia  Allergies  Allergen Reactions  . Penicillins     Rash     Patient Measurements: Height: 5\' 4"  (162.6 cm) Weight: 175 lb 7.8 oz (79.6 kg) IBW/kg (Calculated) : 54.7 Heparin Dosing Weight:   Vital Signs: Temp: 97 F (36.1 C) (11/20 1157) Temp Source: Oral (11/20 1157) BP: 85/26 mmHg (11/20 1500) Pulse Rate: 88 (11/20 1400)  Labs:  Recent Labs  10/06/14 0605  10/07/14 0500  10/07/14 1625  10/08/14 0410 10/08/14 0414 10/08/14 0500 10/08/14 0813 10/08/14 1053 10/08/14 1400  HGB 10.3*  < >  --   < >  --   < > 9.5*  --  9.7* 9.5*  --   --   HCT 30.9*  < >  --   < >  --   < > 28.0*  --  28.9* 28.0*  --   --   PLT 56*  --   --   --   --   --   --   --  37*  --   --   --   APTT  --   --   --   --   --   < >  --  54*  --   --  55* 57*  LABPROT 14.7  --   --   --   --   --   --   --   --   --   --   --   INR 1.13  --   --   --   --   --   --   --   --   --   --   --   CREATININE  --   < > 1.38*  --  1.31*  --   --  1.21*  --   --   --   --   < > = values in this interval not displayed.  Estimated Creatinine Clearance: 49.9 mL/min (by C-G formula based on Cr of 1.21).   Medical History: Past Medical History  Diagnosis Date  . Mixed stress and urge urinary incontinence   . Schizophrenia   . Allergy     Medications:  See EMR  Assessment: 63 yo female with clot in superior vena cava extending to the R atrium and multiple other clots in arm on doppler. Pt is in acute renal failure on CRRT.  Pt received heparin at prophylaxis doses and heparin with CRRT from 11/13 through 11/18, when all heparin products were stopped due to declining platelets.  Plts 252 > 56. 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 aPTTs remain < 60 which is lower end of goal, and with more clots found on  doppler today, increasing dose with goal of aPTT level > 70.   Goal of Therapy:  aPTT 50-90 seconds Monitor platelets by anticoagulation protocol: Yes   Plan:  Increase argatroban to 30 mcg/min Check aPTT in 2 hours (1400), until 2 consecutive therapeutic levels have been reached Monitor for s/sx bleeding F/u HIT panel Consider bridge to warfarin when platelets >150 or back at baseline    Vinnie LevelBenjamin Mancheril, PharmD.  Clinical Pharmacist Pager [phone]   ADDENDUM: 1730 aPTT increased to 64 seconds. Will continue slow increase until aPTT on upper end of goal (>75 seconds)  Plan: -increase Argatroban to  2735mcg/min (~15% increase) -recheck aPTT at 2030  Chad Donoghue D. Krosby Ritchie, PharmD, BCPS Clinical Pharmacist Pager: [phone] 10/08/2014 6:20 PM   ADDENDEUM APTT was drawn ~45 minutes early- still therapeutic at 67seconds, but anticipate true level is right around 70 seconds.  Plan: -increase another 15% to 3640mcg/min -check aPTT with AM labs  Journie Howson D. Talina Pleitez, PharmD, BCPS Clinical Pharmacist Pager: [phone] 10/08/2014 9:05 PM

## 2014-10-08 NOTE — Progress Notes (Signed)
[facility]    Date of Admission:  10/01/2014   Total days of antibiotics 8        Day 5 cefazolin                   ID: [name] is a 63 y.o. female with poorly controled DM with last A1C of 12 brought in to ED after being found unresponsive. Had previous URI symptoms. Patient intubated in the field and admitted to ICU. Started initially on broad spectrum antibiotics and requiring pressor support. Acidodic with initial pH of 6.8. Remains having pressor needs, multi organ dysfunction Active Problems:   DKA (diabetic ketoacidoses)   Cardiac arrest   Foreign body alimentary tract   Septic shock   Encephalopathy acute   Acute respiratory failure, unspecified whether with hypoxia or hypercapnia   AKI (acute kidney injury)   Anoxic brain injury   Community acquired pneumonia   Dehydration    Subjective: Afebrile, off of pressors, remains vent,  On aggatroban for svc clot. TEE reported showed no evidence of endocarditis, and low EF of 25% (formal report pending)  Medications:  . antiseptic oral rinse  7 mL Mouth Rinse QID  .  ceFAZolin (ANCEF) IV  2 g Intravenous Q12H  . chlorhexidine  15 mL Mouth Rinse BID  . docusate  50 mg Per Tube Daily  . insulin aspart  0-15 Units Subcutaneous 6 times per day  . insulin glargine  10 Units Subcutaneous Daily  . ipratropium-albuterol  3 mL Nebulization Q6H  . pantoprazole (PROTONIX) IV  40 mg Intravenous Q24H  . polyethylene glycol  17 g Per Tube Daily    Objective: Vital signs in last 24 hours: Temp:  [97.2 F (36.2 C)-97.7 F (36.5 C)] 97.6 F (36.4 C) (11/20 0737) Pulse Rate:  [35-126] 107 (11/20 0800) Resp:  [19-35] 23 (11/20 0800) BP: (77-142)/(28-78) 119/49 mmHg (11/20 0800) SpO2:  [93 %-100 %] 100 % (11/20 0800) Arterial Line BP: (104-150)/(48-78) 150/78 mmHg (11/20 0800) FiO2 (%):  [40 %-60 %] 50 % (11/20 0800) Weight:  [175 lb 7.8 oz (79.6 kg)] 175 lb 7.8 oz (79.6 kg) (11/20  0500) Physical Exam  Constitutional:  Chronically ill, older than stated age, unresponsive to verbal stimuli HENT: eyes are open  Mouth/Throat: ETT in place Cardiovascular: tachycardic regular rhythm and normal heart sounds. Exam reveals no gallop and no friction rub.  No murmur heard.  Pulmonary/Chest: course BS bilateteraly Neurological: unresponsive Skin: warm extremities, cyanotic on right hand   Lab Results  Recent Labs  10/06/14 0605  10/07/14 1625  10/08/14 0410 10/08/14 0414 10/08/14 0500  WBC 12.7*  --   --   --   --   --  5.6  HGB 10.3*  < >  --   < > 9.5*  --  9.7*  HCT 30.9*  < >  --   < > 28.0*  --  28.9*  NA  --   < > 136*  < > 133* 139  --   K  --   < > 4.2  < > 3.6* 3.8  --   CL  --   < > 87*  --   --  89*  --   CO2  --   < > 37*  --   --  39*  --   BUN  --   < > 16  --   --  18  --  CREATININE  --   < > 1.31*  --   --  1.21*  --   < > = values in this interval not displayed. Liver Panel  Recent Labs  10/07/14 1625 10/08/14 0414  ALBUMIN 1.3* 1.3*   Sedimentation Rate No results for input(s): ESRSEDRATE in the last 72 hours. C-Reactive Protein No results for input(s): CRP in the last 72 hours.  Microbiology: 11/14 urine cx NGTD 11/13 blood cx 1 of 2 blood cx MSSA 11/13 urine cx NGTD 11/13 endotrachael specimen cx b-hemolytic strep and MSSA 11/17 blood cx NGTD 11/19 blood cx -CL pending Studies/Results: Ct Abdomen Pelvis Wo Contrast  10/06/2014   CLINICAL DATA:  Penicillin counter for sepsis, looking for source  EXAM: CT CHEST WITHOUT CONTRAST  TECHNIQUE: Multidetector CT imaging of the chest was performed following the standard protocol without IV contrast.  COMPARISON:  10/01/2014  FINDINGS: The patient is intubated with tip of intubation tube above the carina. A right central line terminates just above the cavoatrial junction. There is extensive bilateral multifocal airspace consolidation. Multiple rounded areas of consolidation are seen  throughout the upper lobes bilaterally. There are air bronchograms through many of these. The largest area of upper lobe consolidation is on the left, measuring 5 by cm. This is a relatively confluent area of consolidation. Both lower lobes show extensive deep tendon consolidation, worse when compared to the prior study. There are moderate bilateral pleural effusions which appear larger when compared to the prior study.  No significant adenopathy in the hilar or mediastinal regions. Minimal pericardial effusion. NG tube extends into the stomach.  Liver, spleen, pancreas, kidneys, gallbladder, and adrenal glands show no acute findings.  Mild calcification of the abdominal aorta.  Nonobstructive gas pattern with normal stomach small bowel and large bowel except for stool retained throughout the large bowel. Rectum is distended moderately with stool.  Bladder is decompressed. Trace free fluid in the pelvis. Heavily calcified 2.5 cm object left adnexal region likely a calcified uterine fibroid.  Significantly increased attenuation throughout the subcutaneous soft tissues consistent with anasarca.  No acute musculoskeletal findings.  IMPRESSION: Widespread multinodular extensive consolidation throughout both lungs. The extensive consolidation has increased when compared to the prior study, as has the size of moderate bilateral pleural effusions. Consolidation in the lower lobes and in the left upper lobe is relatively confluent in may represent pneumonia or aspiration pneumonitis. Rounded areas of consolidation seen throughout the bilateral upper lobes could potentially be related to septic emboli.  No acute abdominal abnormality although there is evidence of constipation with moderate fecal impaction in the rectum. There is also evidence of anasarca.   Electronically Signed   By: Esperanza Heir M.D.   On: 10/06/2014 17:33   Ct Chest Wo Contrast  10/06/2014   CLINICAL DATA:  Penicillin counter for sepsis, looking  for source  EXAM: CT CHEST WITHOUT CONTRAST  TECHNIQUE: Multidetector CT imaging of the chest was performed following the standard protocol without IV contrast.  COMPARISON:  10/01/2014  FINDINGS: The patient is intubated with tip of intubation tube above the carina. A right central line terminates just above the cavoatrial junction. There is extensive bilateral multifocal airspace consolidation. Multiple rounded areas of consolidation are seen throughout the upper lobes bilaterally. There are air bronchograms through many of these. The largest area of upper lobe consolidation is on the left, measuring 5 by cm. This is a relatively confluent area of consolidation. Both lower lobes show extensive deep tendon consolidation, worse when  compared to the prior study. There are moderate bilateral pleural effusions which appear larger when compared to the prior study.  No significant adenopathy in the hilar or mediastinal regions. Minimal pericardial effusion. NG tube extends into the stomach.  Liver, spleen, pancreas, kidneys, gallbladder, and adrenal glands show no acute findings.  Mild calcification of the abdominal aorta.  Nonobstructive gas pattern with normal stomach small bowel and large bowel except for stool retained throughout the large bowel. Rectum is distended moderately with stool.  Bladder is decompressed. Trace free fluid in the pelvis. Heavily calcified 2.5 cm object left adnexal region likely a calcified uterine fibroid.  Significantly increased attenuation throughout the subcutaneous soft tissues consistent with anasarca.  No acute musculoskeletal findings.  IMPRESSION: Widespread multinodular extensive consolidation throughout both lungs. The extensive consolidation has increased when compared to the prior study, as has the size of moderate bilateral pleural effusions. Consolidation in the lower lobes and in the left upper lobe is relatively confluent in may represent pneumonia or aspiration pneumonitis.  Rounded areas of consolidation seen throughout the bilateral upper lobes could potentially be related to septic emboli.  No acute abdominal abnormality although there is evidence of constipation with moderate fecal impaction in the rectum. There is also evidence of anasarca.   Electronically Signed   By: Esperanza Heiraymond  Rubner M.D.   On: 10/06/2014 17:33   Dg Chest Port 1 View  10/06/2014   CLINICAL DATA:  Central line placement.  EXAM: PORTABLE CHEST - 1 VIEW  COMPARISON:  10/06/2014 L5 09 a.m.  FINDINGS: New right internal jugular central venous line has its tip projecting in the lower superior vena cava. No pneumothorax.  Two lumen left internal jugular central venous catheter and right subclavian central venous catheter are stable. Endotracheal tube is stable with its tip projecting 2.7 cm above the carina. Nasogastric tube passes below the diaphragm into the stomach.  Bilateral patchy areas of airspace opacity are unchanged. This may reflect asymmetric edema or multifocal pneumonia. The latter is suspected. More confluent opacity is noted in the left lung base, also stable. Small effusions are likely.  IMPRESSION: 1. New right internal jugular central venous line has its tip in the lower superior vena cava. No pneumothorax. 2. No other change from the earlier study. Persistent patchy areas of airspace opacity with more confluent left lung base opacity. Multifocal pneumonia is suspected.   Electronically Signed   By: Amie Portlandavid  Ormond M.D.   On: 10/06/2014 15:15   Dg Abd Portable 1v  10/07/2014   CLINICAL DATA:  Orogastric tube placement.  EXAM: PORTABLE ABDOMEN - 1 VIEW  COMPARISON:  CT abdomen and pelvis 10/06/2014 and abdominal radiograph 10/01/2014  FINDINGS: Enteric tube projects over the left upper abdomen with tip directed cranially at the level of the left hemidiaphragm, likely in the region of the gastric fundus. Gas and residual oral contrast are partially visualized in multiple loops of mildly distended  bowel in the imaged portion of the abdomen. Patchy opacities are partially visualized in the lung bases, with evaluation limited by motion artifact. The tip of a catheter projects over the lower SVC.  IMPRESSION: Enteric tube tip likely in the gastric fundus.   Electronically Signed   By: Sebastian AcheAllen  Grady   On: 10/07/2014 16:38     Assessment/Plan: MSSA bacteremia/sepsis = continue on cefazolin renally dosed.leukocytosis appears improved but still has ongoing low platelets. Will add differential. If she clinically decompensate, would change antibiotics to include gram negative coverage but also anti-staphyloccal coverage  Pneumonia = will continue on cefazolin, continues to have respiratory failure, vent dependent.  aki = followed by renal and primary team for CRRT  Thrombocytopenia = on argatroban. Hit panel sent off  vs. Infection related, however, no other sources of infection found  Encephalopathy =continue with daily assessment with sedation breaks   Drue SecondSNIDER, [facility] Cell: [phone] Pager: [phone]  10/08/2014, 9:39 AM

## 2014-10-08 NOTE — Progress Notes (Signed)
Patient ID: [name], female   DOB: 1951-01-19, 63 y.o.   MRN: [id number]  [facility] Progress Note    Assessment/ Plan:   1. Acute renal injury: This appears to be from ATN of sepsis +/- PEA arrest. Anuric and now on CRRT support. Will continue CRRT at current prescription with UF of -152mL/hr as tolerated by her hemodynamic status. She was started on Argatroban yesterday after discovery of extensive thrombus associated with the IJ dialysis catheter-she now has a femoral dialysis catheter in place. I will discontinue her sodium citrate and regulation issues on systemic anticoagulation noted to minimize electrolyte abnormalities and metabolic alkalosis from citrate toxicity. 2. Hypophosphatemia: due to CRRT losses- replaced successfully with Kphos IV-will monitor as she will likely need additional replacement 3. Altered mental status:  With metabolic encephalopathy seen on EEG. She appears to be localizing voice/following simple commands. 4. Aspiration pneumonia/sepsis/MSSA bacteremia: on Ancef and being followed by ID- imaging and testing repeated yesterday for continued hypotension/sepsis. 5. Right hand ischemia:improving right hand perfusion-vascular surgery following. Remains on pressors and right arm protected-no plans for surgical intervention.  Subjective:   Events from overnight noted-thrombus associated with IJ dialysis catheter   Objective:   BP 119/49 mmHg  Pulse 107  Temp(Src) 97.6 F (36.4 C) (Oral)  Resp 23  Ht 5\' 4"  (1.626 m)  Wt 79.6 kg (175 lb 7.8 oz)  BMI 30.11 kg/m2  SpO2 100%  Intake/Output Summary (Last 24 hours) at 10/08/14 0904 Last data filed at 10/08/14 0800  Gross per 24 hour  Intake 4163.78 ml  Output   5605 ml  Net -1441.22 ml   Weight change: -2.7 kg (-5 lb 15.2 oz)  Physical Exam: Gen: awake, intubated, on CRRT CVS: pulse regular tachycardia, S1 and S2 normal Resp: anteriorly clear to auscultation-no rales Abd:  soft, obese, nontender Ext: 1-2+ upper and lower extremity edema.  Imaging: Ct Abdomen Pelvis Wo Contrast  10/06/2014   CLINICAL DATA:  Penicillin counter for sepsis, looking for source  EXAM: CT CHEST WITHOUT CONTRAST  TECHNIQUE: Multidetector CT imaging of the chest was performed following the standard protocol without IV contrast.  COMPARISON:  10/01/2014  FINDINGS: The patient is intubated with tip of intubation tube above the carina. A right central line terminates just above the cavoatrial junction. There is extensive bilateral multifocal airspace consolidation. Multiple rounded areas of consolidation are seen throughout the upper lobes bilaterally. There are air bronchograms through many of these. The largest area of upper lobe consolidation is on the left, measuring 5 by cm. This is a relatively confluent area of consolidation. Both lower lobes show extensive deep tendon consolidation, worse when compared to the prior study. There are moderate bilateral pleural effusions which appear larger when compared to the prior study.  No significant adenopathy in the hilar or mediastinal regions. Minimal pericardial effusion. NG tube extends into the stomach.  Liver, spleen, pancreas, kidneys, gallbladder, and adrenal glands show no acute findings.  Mild calcification of the abdominal aorta.  Nonobstructive gas pattern with normal stomach small bowel and large bowel except for stool retained throughout the large bowel. Rectum is distended moderately with stool.  Bladder is decompressed. Trace free fluid in the pelvis. Heavily calcified 2.5 cm object left adnexal region likely a calcified uterine fibroid.  Significantly increased attenuation throughout the subcutaneous soft tissues consistent with anasarca.  No acute musculoskeletal findings.  IMPRESSION: Widespread multinodular extensive consolidation throughout both lungs. The extensive consolidation has increased when compared to the  prior study, as has the  size of moderate bilateral pleural effusions. Consolidation in the lower lobes and in the left upper lobe is relatively confluent in may represent pneumonia or aspiration pneumonitis. Rounded areas of consolidation seen throughout the bilateral upper lobes could potentially be related to septic emboli.  No acute abdominal abnormality although there is evidence of constipation with moderate fecal impaction in the rectum. There is also evidence of anasarca.   Electronically Signed   By: Esperanza Heir M.D.   On: 10/06/2014 17:33   Ct Chest Wo Contrast  10/06/2014   CLINICAL DATA:  Penicillin counter for sepsis, looking for source  EXAM: CT CHEST WITHOUT CONTRAST  TECHNIQUE: Multidetector CT imaging of the chest was performed following the standard protocol without IV contrast.  COMPARISON:  10/01/2014  FINDINGS: The patient is intubated with tip of intubation tube above the carina. A right central line terminates just above the cavoatrial junction. There is extensive bilateral multifocal airspace consolidation. Multiple rounded areas of consolidation are seen throughout the upper lobes bilaterally. There are air bronchograms through many of these. The largest area of upper lobe consolidation is on the left, measuring 5 by cm. This is a relatively confluent area of consolidation. Both lower lobes show extensive deep tendon consolidation, worse when compared to the prior study. There are moderate bilateral pleural effusions which appear larger when compared to the prior study.  No significant adenopathy in the hilar or mediastinal regions. Minimal pericardial effusion. NG tube extends into the stomach.  Liver, spleen, pancreas, kidneys, gallbladder, and adrenal glands show no acute findings.  Mild calcification of the abdominal aorta.  Nonobstructive gas pattern with normal stomach small bowel and large bowel except for stool retained throughout the large bowel. Rectum is distended moderately with stool.  Bladder  is decompressed. Trace free fluid in the pelvis. Heavily calcified 2.5 cm object left adnexal region likely a calcified uterine fibroid.  Significantly increased attenuation throughout the subcutaneous soft tissues consistent with anasarca.  No acute musculoskeletal findings.  IMPRESSION: Widespread multinodular extensive consolidation throughout both lungs. The extensive consolidation has increased when compared to the prior study, as has the size of moderate bilateral pleural effusions. Consolidation in the lower lobes and in the left upper lobe is relatively confluent in may represent pneumonia or aspiration pneumonitis. Rounded areas of consolidation seen throughout the bilateral upper lobes could potentially be related to septic emboli.  No acute abdominal abnormality although there is evidence of constipation with moderate fecal impaction in the rectum. There is also evidence of anasarca.   Electronically Signed   By: Esperanza Heir M.D.   On: 10/06/2014 17:33   Dg Chest Port 1 View  10/06/2014   CLINICAL DATA:  Central line placement.  EXAM: PORTABLE CHEST - 1 VIEW  COMPARISON:  10/06/2014 L5 09 a.m.  FINDINGS: New right internal jugular central venous line has its tip projecting in the lower superior vena cava. No pneumothorax.  Two lumen left internal jugular central venous catheter and right subclavian central venous catheter are stable. Endotracheal tube is stable with its tip projecting 2.7 cm above the carina. Nasogastric tube passes below the diaphragm into the stomach.  Bilateral patchy areas of airspace opacity are unchanged. This may reflect asymmetric edema or multifocal pneumonia. The latter is suspected. More confluent opacity is noted in the left lung base, also stable. Small effusions are likely.  IMPRESSION: 1. New right internal jugular central venous line has its tip in the lower superior vena  cava. No pneumothorax. 2. No other change from the earlier study. Persistent patchy areas of  airspace opacity with more confluent left lung base opacity. Multifocal pneumonia is suspected.   Electronically Signed   By: Amie Portlandavid  Ormond M.D.   On: 10/06/2014 15:15   Dg Abd Portable 1v  10/07/2014   CLINICAL DATA:  Orogastric tube placement.  EXAM: PORTABLE ABDOMEN - 1 VIEW  COMPARISON:  CT abdomen and pelvis 10/06/2014 and abdominal radiograph 10/01/2014  FINDINGS: Enteric tube projects over the left upper abdomen with tip directed cranially at the level of the left hemidiaphragm, likely in the region of the gastric fundus. Gas and residual oral contrast are partially visualized in multiple loops of mildly distended bowel in the imaged portion of the abdomen. Patchy opacities are partially visualized in the lung bases, with evaluation limited by motion artifact. The tip of a catheter projects over the lower SVC.  IMPRESSION: Enteric tube tip likely in the gastric fundus.   Electronically Signed   By: Sebastian AcheAllen  Grady   On: 10/07/2014 16:38    Labs: BMET  Recent Labs Lab 10/05/14 1600 10/06/14 0500  10/06/14 1528  10/06/14 2300  10/07/14 0500  10/07/14 1625  10/08/14 0036 10/08/14 0043 10/08/14 0218 10/08/14 0223 10/08/14 0405 10/08/14 0410 10/08/14 0414  NA 132* 132*  < > 133*  < > 132*  < > 136*  < > 136*  < > 133* 135* 135* 134* 134* 133* 139  K 3.8 4.5  < > 4.0  < > 4.0  < > 3.9  < > 4.2  < > 3.6* 3.7 3.6* 3.6* 3.6* 3.6* 3.8  CL 97 98  --  93*  --  89*  --  90*  --  87*  --   --   --   --   --   --   --  89*  CO2 20 23  --  27  --  29  --  33*  --  37*  --   --   --   --   --   --   --  39*  GLUCOSE 213* 220*  --  204*  --  196*  --  275*  --  195*  --   --   --   --   --   --   --  154*  BUN 33* 26*  --  23  --  20  --  18  --  16  --   --   --   --   --   --   --  18  CREATININE 2.10* 1.78*  --  1.55*  --  1.46*  --  1.38*  --  1.31*  --   --   --   --   --   --   --  1.21*  CALCIUM 7.7* 7.7*  --  8.7  --  8.9  --  8.7  --  10.1  --   --   --   --   --   --   --  10.9*  PHOS  1.8* 1.9*  --  1.5*  --  1.5*  --  1.1*  --  2.4  --   --   --   --   --   --   --  2.7  < > = values in this interval not displayed. CBC  Recent Labs Lab 10/01/14 1620  10/04/14 0330 10/05/14 0400 10/06/14 [id number]  10/08/14 0223 10/08/14 0405 10/08/14 0410 10/08/14 0500  WBC 6.9  < > 26.7* 19.9* 12.7*  --   --   --   --  5.6  NEUTROABS 6.1  --   --   --   --   --   --   --   --   --   HGB 13.6  < > 10.9* 10.3* 10.3*  < > 9.9* 10.2* 9.5* 9.7*  HCT 40.8  < > 31.9* 30.1* 30.9*  < > 29.0* 30.0* 28.0* 28.9*  MCV 81.1  < > 78.0 75.3* 76.7*  --   --   --   --  80.5  PLT 252  < > 71* 57* 56*  --   --   --   --  37*  < > = values in this interval not displayed.  Medications:    . antiseptic oral rinse  7 mL Mouth Rinse QID  .  ceFAZolin (ANCEF) IV  2 g Intravenous Q12H  . chlorhexidine  15 mL Mouth Rinse BID  . docusate  50 mg Per Tube Daily  . insulin aspart  0-15 Units Subcutaneous 6 times per day  . insulin glargine  10 Units Subcutaneous Daily  . ipratropium-albuterol  3 mL Nebulization Q6H  . pantoprazole (PROTONIX) IV  40 mg Intravenous Q24H  . polyethylene glycol  17 g Per Tube Daily    Zetta BillsJay Jaecob Lowden, MD 10/08/2014, 9:04 AM

## 2014-10-08 NOTE — Progress Notes (Signed)
PULMONARY / CRITICAL CARE MEDICINE   Name: [name] MRN: [id number] DOB: 1951/07/10    ADMISSION DATE:  10/01/2014 CONSULTATION DATE:  10/01/2014  REFERRING MD :  EDP  CHIEF COMPLAINT:  DKA, respiratory failure and AMS  INITIAL PRESENTATION: 63 year old female with PMH of diabetes and medical non-compliance who was last seen normal the day PTP at 8 PM.  Husband woke up this AM and was unable to arouse patient.  Evidently both have been "fighting a cold" but patient was coughing more the night before.  EMS was called and patient was brought to the ED.  In the ED patient became unresponsive and was intubated by EDP.  PCCM was called to admit.  STUDIES:  11/13 CXR with aspiration PNA and abdominal x-ray ? dentures in her lower abdomen. 11/13 CT abd/pelvis, no foreign body noted ? Overlap of bowel walls, neg for obstruction  11/14 Echo >EF 35-40%, small pericardial effusion  11/15 Arterial doppler RUE occlusion, per vasc 11/16- renal US, ascites, no hydro 11/17 ct head>>>max sinusitis, no acute 11/18 ct abdo/pelvis>>> 11/17 eeg>>>gen slowing c/w met enceph 11/18 CT Chest/Abd/Pelvis>> no abscess, small effusions, not loculated, infiltrates bilateral 11/19 TEE>no veg, clot svc, ra and on HD cath, 25% 11/20 upper ext dopplers>>> 11/20 lowers>>>  SIGNIFICANT EVENTS: 11/13 Presenting with AMS, DKA and VDRF 11/16- remeain in shock, renal failure 11/16- cvvhd 11/18- SVC clot, all heparin stopped, argatroban started, HD cath changed  SUBJECTIVE:  Off of pressors. BP stable in 50-03B systolic. Repeat blood cultures neg to date. On Ancef.   VITAL SIGNS: Temp:  [97.2 F (36.2 C)-98.1 F (36.7 C)] 97.6 F (36.4 C) (11/20 0737) Pulse Rate:  [35-126] 109 (11/20 0126) Resp:  [19-35] 25 (11/20 0700) BP: (77-142)/(28-78) 81/36 mmHg (11/20 0700) SpO2:  [93 %-100 %] 100 % (11/20 0751) Arterial Line BP: (104-132)/(48-78) 104/48 mmHg (11/20 0700) FiO2 (%):  [40 %-60 %] 50 % (11/20  0751) Weight:  [175 lb 7.8 oz (79.6 kg)] 175 lb 7.8 oz (79.6 kg) (11/20 0500) HEMODYNAMICS: CVP:  [5 mmHg-10 mmHg] 6 mmHg VENTILATOR SETTINGS: Vent Mode:  [-] PRVC FiO2 (%):  [40 %-60 %] 50 % Set Rate:  [25 bmp-35 bmp] 25 bmp Vt Set:  [440 mL] 440 mL PEEP:  [5 cmH20] 5 cmH20 Plateau Pressure:  [20 cmH20-22 cmH20] 22 cmH20 INTAKE / OUTPUT:  Intake/Output Summary (Last 24 hours) at 10/08/14 0757 Last data filed at 10/08/14 0488  Gross per 24 hour  Intake 4310.28 ml  Output   5997 ml  Net -1686.72 ml    PHYSICAL EXAMINATION: General:  Chronically ill appearing female who appears older than stated age Neuro:  rass -2, sedated on vent HEENT:  jvd flat Cardiovascular:  Regular, tachy, Nl S1/S2, -M/R/G. Lungs: rhonchi diffuse Abdomen:  Soft, NT, ND and +BS. Musculoskeletal:  2 +gen edema  RUE hand improving, warm to touch  Skin:  Thin but intact.  LABS:  CBC  Recent Labs Lab 10/05/14 0400 10/06/14 0605  10/08/14 0405 10/08/14 0410 10/08/14 0500  WBC 19.9* 12.7*  --   --   --  5.6  HGB 10.3* 10.3*  < > 10.2* 9.5* 9.7*  HCT 30.1* 30.9*  < > 30.0* 28.0* 28.9*  PLT 57* 56*  --   --   --  37*  < > = values in this interval not displayed. Coag's  Recent Labs Lab 10/04/14 1054 10/06/14 0605 10/07/14 1850 10/07/14 2100 10/08/14 0414  APTT  --   --  52* 53* 54*  INR 1.15 1.13  --   --   --    BMET  Recent Labs Lab 10/07/14 0500  10/07/14 1625  10/08/14 0405 10/08/14 0410 10/08/14 0414  NA 136*  < > 136*  < > 134* 133* 139  K 3.9  < > 4.2  < > 3.6* 3.6* 3.8  CL 90*  --  87*  --   --   --  89*  CO2 33*  --  37*  --   --   --  39*  BUN 18  --  16  --   --   --  18  CREATININE 1.38*  --  1.31*  --   --   --  1.21*  GLUCOSE 275*  --  195*  --   --   --  154*  < > = values in this interval not displayed. Electrolytes  Recent Labs Lab 10/06/14 0500  10/07/14 0500 10/07/14 1625 10/08/14 0414  CALCIUM 7.7*  < > 8.7 10.1 10.9*  MG 2.2  --  2.0  --  2.0   PHOS 1.9*  < > 1.1* 2.4 2.7  < > = values in this interval not displayed. Sepsis Markers  Recent Labs Lab 10/01/14 1007 10/01/14 1650 10/03/14 0421  LATICACIDVEN 1.28 1.6 1.9   ABG  Recent Labs Lab 10/08/14 0223 10/08/14 0410 10/08/14 0415  PHART 7.554* 7.553* 7.566*  PCO2ART 53.8* 53.5* 51.9*  PO2ART 140.0* 105.0* 124.0*   Liver Enzymes  Recent Labs Lab 10/01/14 0950 10/04/14 0330  10/07/14 0500 10/07/14 1625 10/08/14 0414  AST 37 34  --   --   --   --   ALT 14 40*  --   --   --   --   ALKPHOS 121* 112  --   --   --   --   BILITOT 0.4 0.2*  --   --   --   --   ALBUMIN 2.4* 1.3*  < > 1.3* 1.3* 1.3*  < > = values in this interval not displayed. Cardiac Enzymes  Recent Labs Lab 10/01/14 0950 10/01/14 1649 10/02/14 0001  TROPONINI  --  <0.30 <0.30  PROBNP 2023.0*  --   --    Glucose  Recent Labs Lab 10/07/14 0757 10/07/14 1157 10/07/14 1556 10/07/14 1914 10/07/14 2331 10/08/14 0337  GLUCAP 201* 131* 139* 169* 152* 101*    Imaging Dg Abd Portable 1v  10/07/2014   CLINICAL DATA:  Orogastric tube placement.  EXAM: PORTABLE ABDOMEN - 1 VIEW  COMPARISON:  CT abdomen and pelvis 10/06/2014 and abdominal radiograph 10/01/2014  FINDINGS: Enteric tube projects over the left upper abdomen with tip directed cranially at the level of the left hemidiaphragm, likely in the region of the gastric fundus. Gas and residual oral contrast are partially visualized in multiple loops of mildly distended bowel in the imaged portion of the abdomen. Patchy opacities are partially visualized in the lung bases, with evaluation limited by motion artifact. The tip of a catheter projects over the lower SVC.  IMPRESSION: Enteric tube tip likely in the gastric fundus.   Electronically Signed   By: Logan Bores   On: 10/07/2014 16:38     ASSESSMENT / PLAN:  PULMONARY OETT 11/13>>> A: Acute resp failure PNA, MSSA Alkalosis P:   - MV too high for wean unless improved after below  changes - but if MV reduced start wean cpap ps 5/5 - rate 25, FiO2 50, ABg  reviewed, reduce 12, abg repeat a 1 hr - BD  -pcxr in am   CARDIOVASCULAR CVL L [place] TLC 11/13>>> A: Hypotension likely related to acidosis and sepsis. Neg troponins .  CHF >echo 11/14 w/ decreased EF  11/15>max pressors unable to wean, CVP 13  RUE/Hand with mottling - improved Clot 11/19 svc, ra, catheter P:  - CVP 6, cvvhd for volume off, may dc cvvhd in am to int hd? - Follow pressor needs with above approach  - Titrate pressors for MAP >60, successful off 11/20 - Still no need steroids See heme  RENAL A:   Acute renal failure, ATN P:   - cvvhd planned to dc in am  -foley out, bladder scan in am repeat may need straight - chem in am - kvo  GASTROINTESTINAL A:  CT neg dentures or source abscess seeding / abscess P: -  - NGT - TF to goal  - monitor BM  HEMATOLOGIC A:  Anemia, thrombocytopenia (sepsis), r/o HITT type 2 Extensive thrombous RA, svc R/o DIC (although never coagulapathic)  P:  - All heparin products d/c'd 11/19 - Argatroban started 11/19 - HIT panel pending - scd, dc if clot si nlowers - cbc in am - Doppler all ext pending>>> -fibrinogen level, d dimer  INFECTIOUS A:  Likely aspiration PNA. Sepsis  MSSA bacteremia CONTINUED Pressors, TEE negative P:   BCx2 11/13>>>1/2 staph aureus > BC 11/17>>> UC 11/13>>> Sputum 11/13>>>GPC > Abx: Cefepime, start date 11/13>11/14 Vanc 11/14 > 11/16  Per ID Ancef 11/16>>>  Off pressors today, if unable to stay off then would suggest we re broaden ABX, ABX to imi, vanc Pct assessment for source control  ENDOCRINE A:  DM and DKA>BS tr down on Insulin drip  Cortisol high 11/13 , 11/18 P:   - lantus reduction, ssi - CBGs.  NEUROLOGIC A:  AMS due to DKA and acidosis, reassuring eeg, CT without sig anoxia P:   RASS goal: 0 - PRN versed and fentanyl drip - WUA  FAMILY  Husband updated in full 11/16, 11/19, 11/20 - Updates:  sister, long time significant other updated at bedside at length by me on 11/15    Albin Felling, MD Internal Medicine Resident, PGY-1  STAFF NOTE: I, Merrie Roof, MD FACP have personally reviewed patient's available data, including medical history, events of note, physical examination and test results as part of my evaluation. I have discussed with resident/NP and other care providers such as pharmacist, RN and RRT. In addition, I personally evaluated patient and elicited key findings of: just off pressors, doppler ext pending, concern will see clot, send fibrinogen, still follows commands, if back to pressors re add IMI, VANC for other source, continued argatroban, NO COUMADIN !!! The patient is critically ill with multiple organ systems failure and requires high complexity decision making for assessment and support, frequent evaluation and titration of therapies, application of advanced monitoring technologies and extensive interpretation of multiple databases.   Critical Care Time devoted to patient care services described in this note is35  Minutes. This time reflects time of care of this signee: Merrie Roof, MD FACP. This critical care time does not reflect procedure time, or teaching time or supervisory time of PA/NP/Med student/Med Resident etc but could involve care discussion time. Rest per NP/medical resident whose note is outlined above and that I agree with   Lavon Paganini. Titus Mould, MD, [place] Pgr: [facility] 10/08/2014 10:23 AM

## 2014-10-09 ENCOUNTER — Inpatient Hospital Stay (HOSPITAL_COMMUNITY): Payer: Medicare Other

## 2014-10-09 ENCOUNTER — Encounter (HOSPITAL_COMMUNITY): Payer: Self-pay | Admitting: *Deleted

## 2014-10-09 LAB — RENAL FUNCTION PANEL
ANION GAP: 10 (ref 5–15)
ANION GAP: 10 (ref 5–15)
Albumin: 1.4 g/dL — ABNORMAL LOW (ref 3.5–5.2)
Albumin: 1.4 g/dL — ABNORMAL LOW (ref 3.5–5.2)
BUN: 22 mg/dL (ref 6–23)
BUN: 25 mg/dL — AB (ref 6–23)
CALCIUM: 8.1 mg/dL — AB (ref 8.4–10.5)
CHLORIDE: 97 meq/L (ref 96–112)
CO2: 31 meq/L (ref 19–32)
CO2: 28 mEq/L (ref 19–32)
CREATININE: 1.14 mg/dL — AB (ref 0.50–1.10)
Calcium: 8.4 mg/dL (ref 8.4–10.5)
Chloride: 98 mEq/L (ref 96–112)
Creatinine, Ser: 1.19 mg/dL — ABNORMAL HIGH (ref 0.50–1.10)
GFR calc Af Amer: 59 mL/min — ABNORMAL LOW (ref >90)
GFR calc Af Amer: 56 mL/min — ABNORMAL LOW (ref >90)
GFR calc non Af Amer: 51 mL/min — ABNORMAL LOW (ref >90)
GFR calc non Af Amer: 48 mL/min — ABNORMAL LOW (ref >90)
GLUCOSE: 154 mg/dL — AB (ref 70–99)
Glucose, Bld: 176 mg/dL — ABNORMAL HIGH (ref 70–99)
POTASSIUM: 4.9 meq/L (ref 3.7–5.3)
Phosphorus: 2 mg/dL — ABNORMAL LOW (ref 2.3–4.6)
Phosphorus: 2.5 mg/dL (ref 2.3–4.6)
Potassium: 4.8 mEq/L (ref 3.7–5.3)
SODIUM: 136 meq/L — AB (ref 137–147)
Sodium: 138 mEq/L (ref 137–147)

## 2014-10-09 LAB — GLUCOSE, CAPILLARY
GLUCOSE-CAPILLARY: 183 mg/dL — AB (ref 70–99)
GLUCOSE-CAPILLARY: 143 mg/dL — AB (ref 70–99)
Glucose-Capillary: 177 mg/dL — ABNORMAL HIGH (ref 70–99)
Glucose-Capillary: 160 mg/dL — ABNORMAL HIGH (ref 70–99)

## 2014-10-09 LAB — APTT
APTT: 72 s — AB (ref 24–37)
APTT: 78 s — AB (ref 24–37)
aPTT: 71 seconds — ABNORMAL HIGH (ref 24–37)

## 2014-10-09 LAB — MAGNESIUM: Magnesium: 2.2 mg/dL (ref 1.5–2.5)

## 2014-10-09 LAB — PROCALCITONIN: Procalcitonin: 2.85 ng/mL

## 2014-10-09 LAB — CBC
HEMATOCRIT: 25.9 % — AB (ref 36.0–46.0)
Hemoglobin: 8.1 g/dL — ABNORMAL LOW (ref 12.0–15.0)
MCH: 25.5 pg — ABNORMAL LOW (ref 26.0–34.0)
MCHC: 31.3 g/dL (ref 30.0–36.0)
MCV: 81.4 fL (ref 78.0–100.0)
PLATELETS: 41 10*3/uL — AB (ref 150–400)
RBC: 3.18 MIL/uL — AB (ref 3.87–5.11)
RDW: 16.2 % — AB (ref 11.5–15.5)
WBC: 7.6 10*3/uL (ref 4.0–10.5)

## 2014-10-09 MED ORDER — SODIUM CHLORIDE 0.9 % IV SOLN: INTRAVENOUS | Status: DC | PRN

## 2014-10-09 MED ORDER — DEXTROSE 5 % IV SOLN
20.0000 mmol | Freq: Once | INTRAVENOUS | Status: AC
  Administered 2014-10-09: 20 mmol via INTRAVENOUS
  Filled 2014-10-09: qty 6.67

## 2014-10-09 MED ORDER — ANTICOAGULANT SODIUM CITRATE 4% (200MG/5ML) IV SOLN
5.0000 mL | Freq: Once | Status: AC
  Administered 2014-10-09: 3.2 mL via INTRAVENOUS
  Filled 2014-10-09: qty 250

## 2014-10-09 NOTE — Plan of Care (Signed)
Problem: Phase I Progression Outcomes Goal: Pain controlled with appropriate interventions Outcome: Completed/Met Date Met:  10/09/14     

## 2014-10-09 NOTE — Progress Notes (Signed)
ANTICOAGULATION CONSULT NOTE - Follow Up Consult  Pharmacy Consult for argatroban Indication: SVC thrombus w/ thrombocytopenia  Labs:  Recent Labs  10/08/14 0414 10/08/14 0500 10/08/14 0813  10/08/14 1605 10/08/14 1730 10/08/14 1945 10/09/14 0410 10/09/14 0545  HGB  --  9.7* 9.5*  --   --   --   --  8.1*  --   HCT  --  28.9* 28.0*  --   --   --   --  25.9*  --   PLT  --  37*  --   --   --   --   --  41*  --   APTT 54*  --   --   < >  --  64* 67*  --  71*  CREATININE 1.21*  --   --   --  1.21*  --   --  1.14*  --   < > = values in this interval not displayed.    Assessment/Plan:  63yo female therapeutic on heparin after rate increase. Will continue gtt at current rate and confirm stable with additional PTT.   [name], PharmD, BCPS  10/09/2014,6:37 AM

## 2014-10-09 NOTE — Progress Notes (Signed)
Brief episode of bradycardia, heart rate 20's to 30's,  found to have ETT mucus plug. Patient placed back on rest mode and fentanyl gtt re started. VSS.

## 2014-10-09 NOTE — Progress Notes (Addendum)
Patient ID: [name], female   DOB: 1950/12/14, 63 y.o.   MRN: [id number]   [facility] Progress Note    Assessment/ Plan:   1. Acute renal injury: This appears to be from ATN of sepsis +/- PEA arrest. Anuric and now on CRRT support. She is now off pressors and I plan to continue CRRT for another 24 hours and discontinue tomorrow in the morning (or overnight if she clots the filter off). Electrolytes within acceptable limits, volume status significantly better (although she is 14 L positive). She has been on renal replacement therapy now for 6 days and without any evidence of renal recovery. She will likely need intermittent hemodialysis after CRRT discontinued-with her current mental status, she does not a candidate for outpatient hemodialysis 2. Hypophosphatemia: due to CRRT losses- will replace with intravenous potassium phosphate 3. Altered mental status: With metabolic encephalopathy seen on EEG. She appears to be localizing voice/following simple commands. 4. Aspiration pneumonia/sepsis/MSSA bacteremia: on Ancef and being followed by ID- no clear identifiable source/localization of infection except for pneumonia. 5. Right hand ischemia:improving right hand perfusion-vascular surgery following. Off pressors.  Subjective:   No acute events overnight-CRRT filter lasting 24 hours on argatroban    Objective:   BP 93/44 mmHg  Pulse 96  Temp(Src) 98 F (36.7 C) (Oral)  Resp 19  Ht 5\' 4"  (1.626 m)  Wt 78.5 kg (173 lb 1 oz)  BMI 29.69 kg/m2  SpO2 100%  Intake/Output Summary (Last 24 hours) at 10/09/14 0806 Last data filed at 10/09/14 0700  Gross per 24 hour  Intake 2083.25 ml  Output   4464 ml  Net -2380.75 ml   Weight change: -1.1 kg (-2 lb 6.8 oz)  Physical Exam: Gen: Stable, awakens to voice-follow simple commands. Remains intubated CVS: Pulse regular in rate and rhythm, S1 and S2 normal Resp: Clear to auscultation-no distinct rales/rhonchi Abd: Soft,  obese, nontender Ext: 2+ upper extremity edema, 1+ to 2+ lower extremity edema  Imaging: Dg Abd Portable 1v  10/07/2014   CLINICAL DATA:  Orogastric tube placement.  EXAM: PORTABLE ABDOMEN - 1 VIEW  COMPARISON:  CT abdomen and pelvis 10/06/2014 and abdominal radiograph 10/01/2014  FINDINGS: Enteric tube projects over the left upper abdomen with tip directed cranially at the level of the left hemidiaphragm, likely in the region of the gastric fundus. Gas and residual oral contrast are partially visualized in multiple loops of mildly distended bowel in the imaged portion of the abdomen. Patchy opacities are partially visualized in the lung bases, with evaluation limited by motion artifact. The tip of a catheter projects over the lower SVC.  IMPRESSION: Enteric tube tip likely in the gastric fundus.   Electronically Signed   By: Sebastian AcheAllen  Grady   On: 10/07/2014 16:38    Labs: BMET  Recent Labs Lab 10/06/14 1528  10/06/14 2300  10/07/14 0500  10/07/14 1625  10/08/14 0223 10/08/14 0405 10/08/14 0410 10/08/14 0414 10/08/14 0813 10/08/14 1605 10/09/14 0410  NA 133*  < > 132*  < > 136*  < > 136*  < > 134* 134* 133* 139 134* 137 138  K 4.0  < > 4.0  < > 3.9  < > 4.2  < > 3.6* 3.6* 3.6* 3.8 4.0 4.7 4.8  CL 93*  --  89*  --  90*  --  87*  --   --   --   --  89*  --  93* 97  CO2 27  --  29  --  33*  --  37*  --   --   --   --  39*  --  36* 31  GLUCOSE 204*  --  196*  --  275*  --  195*  --   --   --   --  154*  --  173* 176*  BUN 23  --  20  --  18  --  16  --   --   --   --  18  --  20 22  CREATININE 1.55*  --  1.46*  --  1.38*  --  1.31*  --   --   --   --  1.21*  --  1.21* 1.14*  CALCIUM 8.7  --  8.9  --  8.7  --  10.1  --   --   --   --  10.9*  --  9.3 8.4  PHOS 1.5*  --  1.5*  --  1.1*  --  2.4  --   --   --   --  2.7  --  2.4 2.0*  < > = values in this interval not displayed. CBC  Recent Labs Lab 10/05/14 0400 10/06/14 0605  10/08/14 0410 10/08/14 0500 10/08/14 0813 10/09/14 0410   WBC 19.9* 12.7*  --   --  5.6  --  7.6  HGB 10.3* 10.3*  < > 9.5* 9.7* 9.5* 8.1*  HCT 30.1* 30.9*  < > 28.0* 28.9* 28.0* 25.9*  MCV 75.3* 76.7*  --   --  80.5  --  81.4  PLT 57* 56*  --   --  37*  --  41*  < > = values in this interval not displayed.  Medications:    . antiseptic oral rinse  7 mL Mouth Rinse QID  .  ceFAZolin (ANCEF) IV  2 g Intravenous Q12H  . chlorhexidine  15 mL Mouth Rinse BID  . docusate  50 mg Per Tube Daily  . insulin aspart  0-15 Units Subcutaneous 6 times per day  . insulin glargine  5 Units Subcutaneous Daily  . ipratropium-albuterol  3 mL Nebulization Q6H  . pantoprazole (PROTONIX) IV  40 mg Intravenous Q24H  . polyethylene glycol  17 g Per Tube Daily   Zetta BillsJay Zedric Deroy, MD 10/09/2014, 8:06 AM

## 2014-10-09 NOTE — Plan of Care (Signed)
Problem: Phase I Progression Outcomes Goal: VAP prevention protocol initiated Outcome: Completed/Met Date Met:  10/09/14

## 2014-10-09 NOTE — Progress Notes (Signed)
ANTICOAGULATION CONSULT NOTE - Follow Up Consult  Pharmacy Consult for Argatroban Indication: SVC thrombus with thrombocytopenia  Allergies  Allergen Reactions  . Penicillins     Rash     Patient Measurements: Height: 5\' 4"  (162.6 cm) Weight: 173 lb 1 oz (78.5 kg) IBW/kg (Calculated) : 54.7  Vital Signs: Temp: 97.8 F (36.6 C) (11/21 1549) Temp Source: Oral (11/21 1549) BP: 94/50 mmHg (11/21 1800) Pulse Rate: 104 (11/21 1800)  Labs:  Recent Labs  10/08/14 0500 10/08/14 0813  10/08/14 1605  10/09/14 0410 10/09/14 0545 10/09/14 0800 10/09/14 1710  HGB 9.7* 9.5*  --   --   --  8.1*  --   --   --   HCT 28.9* 28.0*  --   --   --  25.9*  --   --   --   PLT 37*  --   --   --   --  41*  --   --   --   APTT  --   --   < >  --   < >  --  71* 72* 78*  CREATININE  --   --   --  1.21*  --  1.14*  --   --  1.19*  < > = values in this interval not displayed.  Estimated Creatinine Clearance: 50.3 mL/min (by C-G formula based on Cr of 1.19).   Medications:  Scheduled:  . antiseptic oral rinse  7 mL Mouth Rinse QID  .  ceFAZolin (ANCEF) IV  2 g Intravenous Q12H  . chlorhexidine  15 mL Mouth Rinse BID  . docusate  50 mg Per Tube Daily  . insulin aspart  0-15 Units Subcutaneous 6 times per day  . insulin glargine  5 Units Subcutaneous Daily  . ipratropium-albuterol  3 mL Nebulization Q6H  . pantoprazole (PROTONIX) IV  40 mg Intravenous Q24H  . polyethylene glycol  17 g Per Tube Daily    Assessment: 63 yo female with clot in superior vena cava extending to the R atrium and multiple other clots in arm on doppler. Pt is in acute renal failure on CRRT. Pt received heparin at prophylaxis doses and heparin with CRRT from 11/13 through 11/18, when all heparin products were stopped due to declining platelets. Plts 252 > 56. 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   HIT test still pending. aPTT level  this evening is therapeutic (aPTT 78 << 72, higher goal of 70-90 requested per CCM). Hgb/Hct slight drop, plts increasing slowly, 41 << 36. No bleeding noted.  Goal of Therapy:  aPTT 50-90 seconds Monitor platelets by anticoagulation protocol: Yes   Plan:  1. Continue argatroban at 40 mcg/min 2. Will continue to monitor for any signs/symptoms of bleeding and will follow up with aPTT level in the a.m.   Georgina PillionElizabeth Marthe Dant, PharmD, BCPS Clinical Pharmacist Pager: [phone] 10/09/2014 6:39 PM

## 2014-10-09 NOTE — Plan of Care (Signed)
Problem: Phase I Progression Outcomes Goal: Sedation Protocol initiated if indicated Outcome: Completed/Met Date Met:  10/09/14

## 2014-10-09 NOTE — Progress Notes (Signed)
PULMONARY / CRITICAL CARE MEDICINE   Name: [name] MRN: [id number] DOB: 06-Jun-1951    ADMISSION DATE:  10/01/2014 CONSULTATION DATE:  10/01/2014  REFERRING MD :  EDP  CHIEF COMPLAINT:  DKA, respiratory failure and AMS  INITIAL PRESENTATION: 63 year old female with PMH of diabetes and medical non-compliance who was last seen normal the day PTP at 8 PM.  Husband woke up this AM and was unable to arouse patient.  Evidently both have been "fighting a cold" but patient was coughing more the night before.  EMS was called and patient was brought to the ED.  In the ED patient became unresponsive and was intubated by EDP.  PCCM was called to admit.  STUDIES:  11/13 CXR with aspiration PNA and abdominal x-ray ? dentures in her lower abdomen. 11/13 CT abd/pelvis, no foreign body noted ? Overlap of bowel walls, neg for obstruction  11/14 Echo >EF 35-40%, small pericardial effusion  11/15 Arterial doppler RUE occlusion, per vasc 11/16- renal US, ascites, no hydro 11/17 ct head>>>max sinusitis, no acute 11/18 ct abdo/pelvis>>> 11/17 eeg>>>gen slowing c/w met enceph 11/18 CT Chest/Abd/Pelvis>> no abscess, small effusions, not loculated, infiltrates bilateral 11/19 TEE>no veg, clot svc, ra and on HD cath, 25% 11/20 upper ext dopplers>>>RUE DVT  11/20 lowers>>>superficial thrombus bilat saphenous  SIGNIFICANT EVENTS: 11/13 Presenting with AMS, DKA and VDRF 11/16- remeain in shock, renal failure 11/16- cvvhd 11/18- SVC clot, all heparin stopped, argatroban started, HD cath changed  SUBJECTIVE:  Off of pressors. Tolerating SBT. Follows some commands.   VITAL SIGNS: Temp:  [95.8 F (35.4 C)-98 F (36.7 C)] 98 F (36.7 C) (11/21 0752) Pulse Rate:  [79-118] 101 (11/21 1000) Resp:  [14-32] 18 (11/21 1000) BP: (67-146)/(26-68) 120/53 mmHg (11/21 0929) SpO2:  [96 %-100 %] 100 % (11/21 1000) Arterial Line BP: (110-151)/(48-73) 117/52 mmHg (11/21 1000) FiO2 (%):  [40 %-50 %] 40 %  (11/21 0929) Weight:  [173 lb 1 oz (78.5 kg)] 173 lb 1 oz (78.5 kg) (11/21 0500) HEMODYNAMICS: CVP:  [6 mmHg-7 mmHg] 7 mmHg VENTILATOR SETTINGS: Vent Mode:  [-] CPAP;PSV FiO2 (%):  [40 %-50 %] 40 % Set Rate:  [12 bmp] 12 bmp Vt Set:  [440 mL] 440 mL PEEP:  [5 cmH20] 5 cmH20 Pressure Support:  [5 cmH20] 5 cmH20 Plateau Pressure:  [15 cmH20-21 cmH20] 15 cmH20 INTAKE / OUTPUT:  Intake/Output Summary (Last 24 hours) at 10/09/14 1021 Last data filed at 10/09/14 1000  Gross per 24 hour  Intake 2056.75 ml  Output   4510 ml  Net -2453.25 ml    PHYSICAL EXAMINATION: General:  Chronically ill appearing female NAD on vent, CRRT  Neuro:  rass 0, follows commands  HEENT:  Mm moist, endentulous, ETT, no JVD  Cardiovascular:  Regular, tachy, Nl S1/S2, -M/R/G. Lungs: resps even non labored  Abdomen:  Soft, NT, ND and +BS. Musculoskeletal:  Improved edema,  RUE hand improving, warm to touch  Skin:  Thin but intact.  LABS:  CBC  Recent Labs Lab 10/06/14 0605  10/08/14 0500 10/08/14 0813 10/09/14 0410  WBC 12.7*  --  5.6  --  7.6  HGB 10.3*  < > 9.7* 9.5* 8.1*  HCT 30.9*  < > 28.9* 28.0* 25.9*  PLT 56*  --  37*  --  41*  < > = values in this interval not displayed. Coag's  Recent Labs Lab 10/04/14 1054 10/06/14 0605  10/08/14 1945 10/09/14 0545 10/09/14 0800  APTT  --   --   < >  67* 71* 72*  INR 1.15 1.13  --   --   --   --   < > = values in this interval not displayed. BMET  Recent Labs Lab 10/08/14 0414 10/08/14 0813 10/08/14 1605 10/09/14 0410  NA 139 134* 137 138  K 3.8 4.0 4.7 4.8  CL 89*  --  93* 97  CO2 39*  --  36* 31  BUN 18  --  20 22  CREATININE 1.21*  --  1.21* 1.14*  GLUCOSE 154*  --  173* 176*   Electrolytes  Recent Labs Lab 10/07/14 0500  10/08/14 0414 10/08/14 1605 10/09/14 0410  CALCIUM 8.7  < > 10.9* 9.3 8.4  MG 2.0  --  2.0  --  2.2  PHOS 1.1*  < > 2.7 2.4 2.0*  < > = values in this interval not displayed. Sepsis  Markers  Recent Labs Lab 10/03/14 0421 10/08/14 1035 10/09/14 0410  LATICACIDVEN 1.9  --   --   PROCALCITON  --  3.43 2.85   ABG  Recent Labs Lab 10/08/14 0415 10/08/14 0813 10/08/14 1156  PHART 7.566* 7.538* 7.511*  PCO2ART 51.9* 52.9* 54.3*  PO2ART 124.0* 97.0 79.0*   Liver Enzymes  Recent Labs Lab 10/04/14 0330  10/08/14 0414 10/08/14 1605 10/09/14 0410  AST 34  --   --   --   --   ALT 40*  --   --   --   --   ALKPHOS 112  --   --   --   --   BILITOT 0.2*  --   --   --   --   ALBUMIN 1.3*  < > 1.3* 1.4* 1.4*  < > = values in this interval not displayed. Cardiac Enzymes No results for input(s): TROPONINI, PROBNP in the last 168 hours. Glucose  Recent Labs Lab 10/08/14 1146 10/08/14 1612 10/08/14 1915 10/08/14 2318 10/09/14 0346 10/09/14 0730  GLUCAP 144* 121* 130* 145* 177* 160*    Imaging No results found.   ASSESSMENT / PLAN:  PULMONARY OETT 11/13>>> A: Acute resp failure PNA, MSSA Alkalosis P:   Daily SBT - tolerating 11/21 F/u abg  BD F/u PCXR  D/c fent gtt, consider extubation if cont to tol PS and mental status cont improve   CARDIOVASCULAR CVL L [place] TLC 11/13>>> L Fem HD cath 11/19>>> Hypotension -  likely related to acidosis and sepsis. Neg troponins .  CHF >echo 11/14 w/ decreased EF  RUE/Hand with mottling - improved Clot 11/19 svc, ra, catheter RUE axillary DVT  P:  - CVVH as below  - off pressors   - See heme - vascular following   RENAL A:   Acute renal failure, ATN - r/t sepsis, PEA arrest  P:   - cont CVVH, likely transition to HD 11/22  - no evidence renal recovery, will likely need continued int HD - ?candidate for outpt HD - chem in am - kvo  GASTROINTESTINAL A:  CT neg dentures or source abscess seeding / abscess P: -  - NGT - TF to goal  - monitor BM  HEMATOLOGIC A:  Anemia, thrombocytopenia (sepsis), r/o HITT type 2 Extensive thrombous RA, svc RUE axillary DVT - neg BLE dopplers  R/o DIC  (although never coagulapathic) P:  - cont argatroban  - HIT panel pending - SCD's  - cbc in am   INFECTIOUS A:  Likely aspiration PNA. Sepsis  MSSA bacteremia P:   BCx2 11/13>>>1/2 staph aureus >  MSSA BC 11/17>>> UC 11/13>>>neg  Sputum 11/13>>>GPC >abundant strep (B hemolytic), abundant staph>>> MSSA  Abx: Cefepime, start date 11/13>11/14 Vanc 11/14 > 11/16  Per ID Ancef 11/16>>>  Cont ancef as above - off pressors  ID following  Await f/u BC  Trend pct   ENDOCRINE A:  DM and DKA P:   - lantus, SSI   NEUROLOGIC A:  AMS due to DKA and acidosis, reassuring eeg, CT without sig anoxia - improving  P:   RASS goal: 0 -wean fent gtt  - WUA  FAMILY  No family available 11/21  Nickolas Madrid, NP 10/09/2014  10:21 AM Pager: [phone] or [phone]  Patient weaning well, continued CVVH for now, will stop in AM, will likely trial extubate patient in AM after volume negative.  Specially that earlier today patient decompensated with a large plug that obstructed her airway.  My CC time independent of extender 35 min.  Patient seen and examined, agree with above note.  I dictated the care and orders written for this patient under my direction.  Rush Farmer, MD [phone]

## 2014-10-09 NOTE — Plan of Care (Signed)
Problem: Phase I Progression Outcomes Goal: GIProphysixis Outcome: Completed/Met Date Met:  10/09/14

## 2014-10-09 NOTE — Plan of Care (Signed)
Problem: Phase I Progression Outcomes Goal: Oral Care per Protocol Outcome: Completed/Met Date Met:  10/09/14

## 2014-10-09 NOTE — Progress Notes (Signed)
ANTICOAGULATION CONSULT NOTE - Follow Up Consult  Pharmacy Consult for Argatroban Indication: SVC thrombus with thrombocytopenia  Allergies  Allergen Reactions  . Penicillins     Rash     Patient Measurements: Height: 5\' 4"  (162.6 cm) Weight: 173 lb 1 oz (78.5 kg) IBW/kg (Calculated) : 54.7 Heparin Dosing Weight:   Vital Signs: Temp: 98 F (36.7 C) (11/21 0752) Temp Source: Oral (11/21 0752) BP: 89/58 mmHg (11/21 0800) Pulse Rate: 99 (11/21 0800)  Labs:  Recent Labs  10/08/14 0414 10/08/14 0500 10/08/14 0813  10/08/14 1605  10/08/14 1945 10/09/14 0410 10/09/14 0545 10/09/14 0800  HGB  --  9.7* 9.5*  --   --   --   --  8.1*  --   --   HCT  --  28.9* 28.0*  --   --   --   --  25.9*  --   --   PLT  --  37*  --   --   --   --   --  41*  --   --   APTT 54*  --   --   < >  --   < > 67*  --  71* 72*  CREATININE 1.21*  --   --   --  1.21*  --   --  1.14*  --   --   < > = values in this interval not displayed.  Estimated Creatinine Clearance: 52.5 mL/min (by C-G formula based on Cr of 1.14).   Medications:  Scheduled:  . antiseptic oral rinse  7 mL Mouth Rinse QID  .  ceFAZolin (ANCEF) IV  2 g Intravenous Q12H  . chlorhexidine  15 mL Mouth Rinse BID  . docusate  50 mg Per Tube Daily  . insulin aspart  0-15 Units Subcutaneous 6 times per day  . insulin glargine  5 Units Subcutaneous Daily  . ipratropium-albuterol  3 mL Nebulization Q6H  . pantoprazole (PROTONIX) IV  40 mg Intravenous Q24H  . polyethylene glycol  17 g Per Tube Daily  . potassium phosphate IVPB (mmol)  20 mmol Intravenous Once    Assessment: 63yo female with SVC thrombus, on Argatroban d/t R/O HIT.  PTT this AM within goal at 72.  Hg is down this AM, pltc improved.  No bleeding noted per d/w RN.  Goal of Therapy:  aPTT 50-90 seconds Monitor platelets by anticoagulation protocol: Yes   Plan:  Continue Argatroban at current rate  Marisue HumbleKendra Takeyla Million, PharmD Clinical Pharmacist [facility]  [facility]

## 2014-10-09 NOTE — Plan of Care (Signed)
Problem: Phase I Progression Outcomes Goal: VTE prophylaxis Outcome: Completed/Met Date Met:  10/09/14

## 2014-10-09 NOTE — Plan of Care (Signed)
Problem: Phase I Progression Outcomes Goal: HOB elevated 30 degrees Outcome: Completed/Met Date Met:  10/09/14

## 2014-10-09 NOTE — Plan of Care (Signed)
Problem: Phase I Progression Outcomes Goal: Patient tolerating nututrition at goal Outcome: Completed/Met Date Met:  10/09/14

## 2014-10-10 LAB — PREPARE RBC (CROSSMATCH)

## 2014-10-10 LAB — GLUCOSE, CAPILLARY
GLUCOSE-CAPILLARY: 181 mg/dL — AB (ref 70–99)
Glucose-Capillary: 144 mg/dL — ABNORMAL HIGH (ref 70–99)
Glucose-Capillary: 178 mg/dL — ABNORMAL HIGH (ref 70–99)
Glucose-Capillary: 193 mg/dL — ABNORMAL HIGH (ref 70–99)
Glucose-Capillary: 205 mg/dL — ABNORMAL HIGH (ref 70–99)
Glucose-Capillary: 211 mg/dL — ABNORMAL HIGH (ref 70–99)

## 2014-10-10 LAB — PROCALCITONIN: Procalcitonin: 2.93 ng/mL

## 2014-10-10 LAB — CBC WITH DIFFERENTIAL/PLATELET
BASOS PCT: 0 % (ref 0–1)
Basophils Absolute: 0 10*3/uL (ref 0.0–0.1)
Eosinophils Absolute: 0 10*3/uL (ref 0.0–0.7)
Eosinophils Relative: 0 % (ref 0–5)
HCT: 22.4 % — ABNORMAL LOW (ref 36.0–46.0)
Hemoglobin: 7.2 g/dL — ABNORMAL LOW (ref 12.0–15.0)
LYMPHS PCT: 7 % — AB (ref 12–46)
Lymphs Abs: 0.6 10*3/uL — ABNORMAL LOW (ref 0.7–4.0)
MCH: 26.7 pg (ref 26.0–34.0)
MCHC: 32.1 g/dL (ref 30.0–36.0)
MCV: 83 fL (ref 78.0–100.0)
MONOS PCT: 4 % (ref 3–12)
Monocytes Absolute: 0.3 10*3/uL (ref 0.1–1.0)
NEUTROS ABS: 7 10*3/uL (ref 1.7–7.7)
NEUTROS PCT: 88 % — AB (ref 43–77)
Platelets: 72 10*3/uL — ABNORMAL LOW (ref 150–400)
RBC: 2.7 MIL/uL — AB (ref 3.87–5.11)
RDW: 16.3 % — ABNORMAL HIGH (ref 11.5–15.5)
WBC: 7.9 10*3/uL (ref 4.0–10.5)

## 2014-10-10 LAB — BASIC METABOLIC PANEL
Anion gap: 12 (ref 5–15)
BUN: 48 mg/dL — AB (ref 6–23)
CO2: 27 mEq/L (ref 19–32)
Calcium: 8 mg/dL — ABNORMAL LOW (ref 8.4–10.5)
Chloride: 98 mEq/L (ref 96–112)
Creatinine, Ser: 2.3 mg/dL — ABNORMAL HIGH (ref 0.50–1.10)
GFR calc Af Amer: 25 mL/min — ABNORMAL LOW (ref >90)
GFR calc non Af Amer: 22 mL/min — ABNORMAL LOW (ref >90)
GLUCOSE: 165 mg/dL — AB (ref 70–99)
POTASSIUM: 5 meq/L (ref 3.7–5.3)
Sodium: 137 mEq/L (ref 137–147)

## 2014-10-10 LAB — RENAL FUNCTION PANEL
Albumin: 1.4 g/dL — ABNORMAL LOW (ref 3.5–5.2)
Anion gap: 11 (ref 5–15)
BUN: 38 mg/dL — AB (ref 6–23)
CALCIUM: 7.9 mg/dL — AB (ref 8.4–10.5)
CO2: 27 meq/L (ref 19–32)
CREATININE: 1.69 mg/dL — AB (ref 0.50–1.10)
Chloride: 97 mEq/L (ref 96–112)
GFR calc Af Amer: 37 mL/min — ABNORMAL LOW (ref >90)
GFR, EST NON AFRICAN AMERICAN: 32 mL/min — AB (ref >90)
Glucose, Bld: 193 mg/dL — ABNORMAL HIGH (ref 70–99)
Phosphorus: 2.7 mg/dL (ref 2.3–4.6)
Potassium: 5.2 mEq/L (ref 3.7–5.3)
Sodium: 135 mEq/L — ABNORMAL LOW (ref 137–147)

## 2014-10-10 LAB — URINALYSIS, ROUTINE W REFLEX MICROSCOPIC
GLUCOSE, UA: NEGATIVE mg/dL
Ketones, ur: 15 mg/dL — AB
NITRITE: NEGATIVE
Protein, ur: 100 mg/dL — AB
SPECIFIC GRAVITY, URINE: 1.016 (ref 1.005–1.030)
Urobilinogen, UA: 0.2 mg/dL (ref 0.0–1.0)
pH: 6 (ref 5.0–8.0)

## 2014-10-10 LAB — URINE MICROSCOPIC-ADD ON

## 2014-10-10 LAB — CBC
HCT: 20.5 % — ABNORMAL LOW (ref 36.0–46.0)
Hemoglobin: 6.5 g/dL — CL (ref 12.0–15.0)
MCH: 26.7 pg (ref 26.0–34.0)
MCHC: 31.7 g/dL (ref 30.0–36.0)
MCV: 84.4 fL (ref 78.0–100.0)
PLATELETS: 68 10*3/uL — AB (ref 150–400)
RBC: 2.43 MIL/uL — ABNORMAL LOW (ref 3.87–5.11)
RDW: 16.4 % — ABNORMAL HIGH (ref 11.5–15.5)
WBC: 8 10*3/uL (ref 4.0–10.5)

## 2014-10-10 LAB — APTT
aPTT: 75 seconds — ABNORMAL HIGH (ref 24–37)
aPTT: 64 seconds — ABNORMAL HIGH (ref 24–37)

## 2014-10-10 LAB — ABO/RH: ABO/RH(D): B POS

## 2014-10-10 LAB — MAGNESIUM: Magnesium: 2.3 mg/dL (ref 1.5–2.5)

## 2014-10-10 MED ORDER — SODIUM CHLORIDE 0.9 % IV SOLN: INTRAVENOUS | Status: DC | PRN

## 2014-10-10 MED ORDER — INSULIN GLARGINE 100 UNIT/ML ~~LOC~~ SOLN
8.0000 [IU] | Freq: Every day | SUBCUTANEOUS | Status: DC
  Administered 2014-10-11: 8 [IU] via SUBCUTANEOUS
  Filled 2014-10-10 (×2): qty 0.08

## 2014-10-10 MED ORDER — SODIUM POLYSTYRENE SULFONATE 15 GM/60ML PO SUSP
30.0000 g | Freq: Once | ORAL | Status: AC
  Administered 2014-10-10: 30 g
  Filled 2014-10-10: qty 120

## 2014-10-10 MED ORDER — CEFAZOLIN SODIUM 1-5 GM-% IV SOLN
1.0000 g | INTRAVENOUS | Status: DC
  Administered 2014-10-10 – 2014-10-12 (×3): 1 g via INTRAVENOUS
  Filled 2014-10-10 (×4): qty 50

## 2014-10-10 MED ORDER — PANTOPRAZOLE SODIUM 40 MG PO PACK
40.0000 mg | PACK | Freq: Every day | ORAL | Status: DC
  Administered 2014-10-10 – 2014-10-11 (×2): 40 mg
  Filled 2014-10-10 (×3): qty 20

## 2014-10-10 MED ORDER — SODIUM CHLORIDE 0.9 % IV SOLN
Freq: Once | INTRAVENOUS | Status: AC
  Administered 2014-10-10: 07:00:00 via INTRAVENOUS

## 2014-10-10 NOTE — Progress Notes (Signed)
PULMONARY / CRITICAL CARE MEDICINE   Name: [name] MRN: [id number] DOB: 23-Oct-1951    ADMISSION DATE:  10/01/2014 CONSULTATION DATE:  10/01/2014  REFERRING MD :  EDP  CHIEF COMPLAINT:  DKA, respiratory failure and AMS  INITIAL PRESENTATION: 63 year old female with PMH of diabetes and medical non-compliance who was last seen normal the day PTP at 8 PM.  Husband woke up this AM and was unable to arouse patient.  Evidently both have been "fighting a cold" but patient was coughing more the night before.  EMS was called and patient was brought to the ED.  In the ED patient became unresponsive and was intubated by EDP.  PCCM was called to admit.  STUDIES:  11/13 CXR with aspiration PNA and abdominal x-ray ? dentures in her lower abdomen. 11/13 CT abd/pelvis, no foreign body noted ? Overlap of bowel walls, neg for obstruction  11/14 Echo >EF 35-40%, small pericardial effusion  11/15 Arterial doppler RUE occlusion, per vasc 11/16- renal US, ascites, no hydro 11/17 ct head>>>max sinusitis, no acute 11/18 ct abdo/pelvis>>> 11/17 eeg>>>gen slowing c/w met enceph 11/18 CT Chest/Abd/Pelvis>> no abscess, small effusions, not loculated, infiltrates bilateral 11/19 TEE>no veg, clot svc, ra and on HD cath, 25% 11/20 upper ext dopplers>>>RUE DVT  11/20 lowers>>>superficial thrombus bilat saphenous  SIGNIFICANT EVENTS: 11/13 Presenting with AMS, DKA and VDRF 11/16- remeain in shock, renal failure 11/16- cvvhd 11/18- SVC clot, all heparin stopped, argatroban started, HD cath changed  SUBJECTIVE:  Off of pressors. Tolerating SBT. Now off CVVH.  Looks ok on PS but weak.   VITAL SIGNS: Temp:  [97.8 F (36.6 C)-99.8 F (37.7 C)] 99.8 F (37.7 C) (11/22 0915) Pulse Rate:  [35-126] 104 (11/22 1000) Resp:  [16-30] 26 (11/22 1000) BP: (80-128)/(26-93) 103/48 mmHg (11/22 1000) SpO2:  [94 %-100 %] 98 % (11/22 1000) Arterial Line BP: (94-185)/(44-91) 119/55 mmHg (11/22 1000) FiO2 (%):   [40 %] 40 % (11/22 0915) Weight:  [172 lb 6.4 oz (78.2 kg)] 172 lb 6.4 oz (78.2 kg) (11/22 0400) HEMODYNAMICS: CVP:  [6 mmHg-9 mmHg] 6 mmHg VENTILATOR SETTINGS: Vent Mode:  [-] CPAP;PSV FiO2 (%):  [40 %] 40 % Set Rate:  [12 bmp] 12 bmp Vt Set:  [440 mL] 440 mL PEEP:  [5 cmH20] 5 cmH20 Pressure Support:  [5 cmH20] 5 cmH20 Plateau Pressure:  [14 cmH20-16 cmH20] 16 cmH20 INTAKE / OUTPUT:  Intake/Output Summary (Last 24 hours) at 10/10/14 1017 Last data filed at 10/10/14 0915  Gross per 24 hour  Intake 2755.3 ml  Output   1611 ml  Net 1144.3 ml    PHYSICAL EXAMINATION: General:  Chronically ill appearing female NAD on vent Neuro:  rass 0, follows some commands, not as alert today, weak  HEENT:  Mm moist, endentulous, ETT, no JVD  Cardiovascular:  Regular, tachy, Nl S1/S2, -M/R/G. Lungs: resps even non labored, Vt 400-500 on PS 5/5 Abdomen:  Soft, NT, ND and +BS. Musculoskeletal:  Improved edema,  RUE hand improving, warm to touch  Skin:  Thin but intact.  LABS:  CBC  Recent Labs Lab 10/08/14 0500 10/08/14 0813 10/09/14 0410 10/10/14 0410  WBC 5.6  --  7.6 8.0  HGB 9.7* 9.5* 8.1* 6.5*  HCT 28.9* 28.0* 25.9* 20.5*  PLT 37*  --  41* 68*   Coag's  Recent Labs Lab 10/04/14 1054 10/06/14 0605  10/09/14 0800 10/09/14 1710 10/10/14 0410  APTT  --   --   < > 72* 78* 75*  INR 1.15 1.13  --   --   --   --   < > = values in this interval not displayed. BMET  Recent Labs Lab 10/09/14 0410 10/09/14 1710 10/10/14 0410  NA 138 136* 135*  K 4.8 4.9 5.2  CL 97 98 97  CO2 '31 28 27  ' BUN 22 25* 38*  CREATININE 1.14* 1.19* 1.69*  GLUCOSE 176* 154* 193*   Electrolytes  Recent Labs Lab 10/08/14 0414  10/09/14 0410 10/09/14 1710 10/10/14 0410  CALCIUM 10.9*  < > 8.4 8.1* 7.9*  MG 2.0  --  2.2  --  2.3  PHOS 2.7  < > 2.0* 2.5 2.7  < > = values in this interval not displayed. Sepsis Markers  Recent Labs Lab 10/08/14 1035 10/09/14 0410 10/10/14 0410   PROCALCITON 3.43 2.85 2.93   ABG  Recent Labs Lab 10/08/14 0415 10/08/14 0813 10/08/14 1156  PHART 7.566* 7.538* 7.511*  PCO2ART 51.9* 52.9* 54.3*  PO2ART 124.0* 97.0 79.0*   Liver Enzymes  Recent Labs Lab 10/04/14 0330  10/09/14 0410 10/09/14 1710 10/10/14 0410  AST 34  --   --   --   --   ALT 40*  --   --   --   --   ALKPHOS 112  --   --   --   --   BILITOT 0.2*  --   --   --   --   ALBUMIN 1.3*  < > 1.4* 1.4* 1.4*  < > = values in this interval not displayed. Cardiac Enzymes No results for input(s): TROPONINI, PROBNP in the last 168 hours. Glucose  Recent Labs Lab 10/09/14 0730 10/09/14 1113 10/09/14 1939 10/09/14 2353 10/10/14 0342 10/10/14 0720  GLUCAP 160* 183* 143* 211* 178* 193*    Imaging Dg Chest Port 1 View  10/09/2014   CLINICAL DATA:  Respiratory failure.  EXAM: PORTABLE CHEST - 1 VIEW  COMPARISON:  10/06/2014  FINDINGS: Endotracheal tube remains with the tip approximately 3 cm above the carina. Lungs show persistent bilateral airspace infiltrates with some reduction in lung volumes bilaterally. No overt edema. The heart size is stable. No significant pleural fluid. No pneumothorax identified.  IMPRESSION: Decrease in bilateral lung volumes. Otherwise stable bilateral infiltrates.   Electronically Signed   By: Aletta Edouard M.D.   On: 10/09/2014 08:12     ASSESSMENT / PLAN:  PULMONARY OETT 11/13>>> A: Acute resp failure PNA, MSSA Alkalosis P:   Daily SBT - tolerating well 11/22 but still weak, fluctuant mental status  F/u abg  BD F/u PCXR    CARDIOVASCULAR CVL L [place] TLC 11/13>>> L Fem HD cath 11/19>>> Hypotension -  Improved. Likely related to acidosis and sepsis. CHF >echo 11/14 w/ decreased EF  RUE/Hand with mottling - improved Clot 11/19 svc, ra, catheter RUE axillary DVT  P:  - int HD  - off pressors   - See heme - vascular following   RENAL A:   Acute renal failure, ATN - r/t sepsis, PEA arrest  Hypophosphatemia  P:    - off CVVH, int HD as needed - planned 11/23 - no evidence renal recovery, will likely need continued int HD - not candidate for outpt HD - chem in am - kvo -replete electrolytes PRN  GASTROINTESTINAL A:  CT neg  P: -  - NGT - TF to goal  - monitor BM - PPI   HEMATOLOGIC A:  Anemia - 11/22 r/t clotted CVVH circuit  thrombocytopenia (  sepsis), r/o HITT type 2 Extensive thrombous RA, svc RUE axillary DVT - neg BLE dopplers   P:  - cont argatroban  - HIT panel pending - SCD's  - s/p PRBC  - cbc in am   INFECTIOUS A:  Likely aspiration PNA. Sepsis  MSSA bacteremia P:   BCx2 11/13>>>1/2 staph aureus > MSSA BC 11/17>>> UC 11/13>>>neg  Sputum 11/13>>>GPC >abundant strep (B hemolytic), abundant staph>>> MSSA  Abx: Cefepime, start date 11/13>11/14 Vanc 11/14 > 11/16  Per ID Ancef 11/16>>>  Cont ancef as above - off pressors  ID following  Await f/u BC  Trend pct   ENDOCRINE A:  DM and DKA P:   - lantus, SSI   NEUROLOGIC A:  AMS due to DKA and acidosis, reassuring eeg, CT without sig anoxia - improving  P:   RASS goal: 0 -wean fent gtt  - WUA  FAMILY  No family available 11/22  Nickolas Madrid, NP 10/10/2014  10:17 AM Pager: [phone] or [phone]   Will not extubate today, needs additional volume negative, proceed with weaning efforts then SBT again in AM.  My CC time 35 min independent of extender's time.  Patient seen and examined, agree with above note. I dictated the care and orders written for this patient under my direction.  Rush Farmer, MD [phone]

## 2014-10-10 NOTE — Progress Notes (Signed)
Patient ID: [name], female   DOB: Sep 05, 1951, 63 y.o.   MRN: [id number]  [facility] Progress Note    Assessment/ Plan:   1. Acute renal injury: Appears to be from ATN of sepsis +/- PEA arrest. Anuric (with improving UOP) , stopped CRRT support last night. Electrolytes within acceptable limits, volume status significantly better. Will plan to give Kayexaate today to prevent hyperkalemia s/p PRBC with pre-transfusion K 5.2. Ordered HD for tomorrow-with her current mental status, she does not a candidate for outpatient hemodialysis 2. Hypophosphatemia: due to CRRT losses- replaced with intravenous potassium phosphate 3. Altered mental status: With metabolic encephalopathy seen on EEG. She appears to be localizing voice/following simple commands. 4. Aspiration pneumonia/sepsis/MSSA bacteremia: on Ancef and being followed by ID- no clear identifiable source/localization of infection except for pneumonia. 5. Right hand ischemia:improving right hand perfusion-vascular surgery following. Off pressors. 6. Anemia: from critical illness and now CRRT loss- ongoing PRBC transfusion  Subjective:   Episode of bradycardia noted yesterday with ETT mucus plugging. CRRT stopped last night after filter clotted off.   Objective:   BP 99/46 mmHg  Pulse 114  Temp(Src) 98.8 F (37.1 C) (Oral)  Resp 22  Ht 5\' 4"  (1.626 m)  Wt 78.2 kg (172 lb 6.4 oz)  BMI 29.58 kg/m2  SpO2 99%  Intake/Output Summary (Last 24 hours) at 10/10/14 0819 Last data filed at 10/10/14 0715  Gross per 24 hour  Intake 2571.7 ml  Output   2076 ml  Net  495.7 ml   Weight change: -0.3 kg (-10.6 oz)  Physical Exam: NWG:NFAOZHYQMGen:Intubated, comfortable VHQ:IONGECVS:Pulse regular tachycardia, ESM over apex Resp:CTA bilaterally, no rales/rhonchi XBM:WUXLAbd:soft, obese, NT, BS normal Ext:1-2+ UE>LE edema  Imaging: Dg Chest Port 1 View  10/09/2014   CLINICAL DATA:  Respiratory failure.  EXAM: PORTABLE CHEST - 1 VIEW   COMPARISON:  10/06/2014  FINDINGS: Endotracheal tube remains with the tip approximately 3 cm above the carina. Lungs show persistent bilateral airspace infiltrates with some reduction in lung volumes bilaterally. No overt edema. The heart size is stable. No significant pleural fluid. No pneumothorax identified.  IMPRESSION: Decrease in bilateral lung volumes. Otherwise stable bilateral infiltrates.   Electronically Signed   By: Irish LackGlenn  Yamagata M.D.   On: 10/09/2014 08:12    Labs: BMET  Recent Labs Lab 10/07/14 0500  10/07/14 1625  10/08/14 0410 10/08/14 0414 10/08/14 0813 10/08/14 1605 10/09/14 0410 10/09/14 1710 10/10/14 0410  NA 136*  < > 136*  < > 133* 139 134* 137 138 136* 135*  K 3.9  < > 4.2  < > 3.6* 3.8 4.0 4.7 4.8 4.9 5.2  CL 90*  --  87*  --   --  89*  --  93* 97 98 97  CO2 33*  --  37*  --   --  39*  --  36* 31 28 27   GLUCOSE 275*  --  195*  --   --  154*  --  173* 176* 154* 193*  BUN 18  --  16  --   --  18  --  20 22 25* 38*  CREATININE 1.38*  --  1.31*  --   --  1.21*  --  1.21* 1.14* 1.19* 1.69*  CALCIUM 8.7  --  10.1  --   --  10.9*  --  9.3 8.4 8.1* 7.9*  PHOS 1.1*  --  2.4  --   --  2.7  --  2.4 2.0* 2.5 2.7  < > =  values in this interval not displayed. CBC  Recent Labs Lab 10/06/14 0605  10/08/14 0500 10/08/14 0813 10/09/14 0410 10/10/14 0410  WBC 12.7*  --  5.6  --  7.6 8.0  HGB 10.3*  < > 9.7* 9.5* 8.1* 6.5*  HCT 30.9*  < > 28.9* 28.0* 25.9* 20.5*  MCV 76.7*  --  80.5  --  81.4 84.4  PLT 56*  --  37*  --  41* 68*  < > = values in this interval not displayed.  Medications:    . antiseptic oral rinse  7 mL Mouth Rinse QID  .  ceFAZolin (ANCEF) IV  2 g Intravenous Q12H  . chlorhexidine  15 mL Mouth Rinse BID  . docusate  50 mg Per Tube Daily  . insulin aspart  0-15 Units Subcutaneous 6 times per day  . insulin glargine  5 Units Subcutaneous Daily  . ipratropium-albuterol  3 mL Nebulization Q6H  . pantoprazole (PROTONIX) IV  40 mg Intravenous Q24H   . polyethylene glycol  17 g Per Tube Daily  . sodium polystyrene  30 g Per Tube Once   Abigail BillsJay Navarro Nine, MD 10/10/2014, 8:19 AM

## 2014-10-10 NOTE — Progress Notes (Addendum)
ANTICOAGULATION & ANTIBIOTIC CONSULT NOTE - Follow Up Consult  Pharmacy Consult for Argatroban + Cefazolin Indication: SVC thrombus, r/o HIT and MSSA bacteremia  Allergies  Allergen Reactions  . Penicillins     Rash     Patient Measurements: Height: 5\' 4"  (162.6 cm) Weight: 172 lb 6.4 oz (78.2 kg) IBW/kg (Calculated) : 54.7  Vital Signs: Temp: 99.8 F (37.7 C) (11/22 0915) Temp Source: Oral (11/22 0715) BP: 103/48 mmHg (11/22 1000) Pulse Rate: 104 (11/22 1000)  Labs:  Recent Labs  10/08/14 0500 10/08/14 0813  10/09/14 0410  10/09/14 0800 10/09/14 1710 10/10/14 0410  HGB 9.7* 9.5*  --  8.1*  --   --   --  6.5*  HCT 28.9* 28.0*  --  25.9*  --   --   --  20.5*  PLT 37*  --   --  41*  --   --   --  68*  APTT  --   --   < >  --   < > 72* 78* 75*  CREATININE  --   --   < > 1.14*  --   --  1.19* 1.69*  < > = values in this interval not displayed.  Estimated Creatinine Clearance: 35.4 mL/min (by C-G formula based on Cr of 1.69).   Medications:  Argatroban at 40 mcg/min  Assessment: 63 yo female with clot in superior vena cava extending to the R atrium and multiple other clots in arm on doppler. Pt is in acute renal failure on CRRT. Pt received heparin at prophylaxis doses and heparin with CRRT from 11/13 through 11/18, when all heparin products were stopped due to declining platelets. Plts 252 > 56. 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   HIT test still pending. aPTT level this morning is therapeutic (aPTT 75 << 78, higher goal of 70-90 requested per CCM). Hgb/Hct drop - may be related to blood loss with stopping CRRT, plts increasing, 68 << 41. No bleeding noted.  The patient also continues on Cefazolin for MSSA bacteremia/PNA - TEE negative for endocarditis, ID on board. The patient stopped CRRT on 11/21 evening and will transition to IHD starting on 11/23. Since the patient's HD  schedule is unknown (per renal, not an outpatient candidate) - will adjust Cefazolin to q24h dosing for now.  Goal of Therapy:  aPTT 50-90 seconds (higher range of 70-90 seconds requested per CCM) Monitor platelets by anticoagulation protocol: Yes Proper antibiotics for infection/cultures adjusted for renal/hepatic function    Plan:  1. Continue argatroban at 40 mcg/min 2. Adjust Cefazolin to 1g IV every 24 hours 3. Will adjust protonix to per tube per P&T protocol 4. Will continue to monitor for any signs/symptoms of bleeding and will follow up with aPTT level this evening 5. Will continue to follow renal function, culture results, LOT, and antibiotic de-escalation plans   Georgina PillionElizabeth Quoc Tome, PharmD, BCPS Clinical Pharmacist Pager: [phone] 10/10/2014 1:05 PM    ------------------------------------------------------------------------------------------ Addendum:  aPTT this evening is slightly SUBtherapeutic of higher goal range desired per CCM (aPTT 64 <<75, higher goal range of 70-90 seconds requested per CCM). Hgb 7.2 << 6.5, plts 72 << 68. Will increase argatroban slightly by 15% to get towards higher goal range.  **Of noting - aPTT not crossing over into EPIC, results were faxed from lab to pharmacy and will be placed in patient's notebook **  Plan 1. Increase argatroban slightly  to 45 mcg/min 2. Will continue to monitor for any signs/symptoms of bleeding and will follow up with an aPTT with AM labs  Georgina PillionElizabeth Geordie Nooney, PharmD, BCPS Clinical Pharmacist Pager: [phone] 10/10/2014 7:54 PM

## 2014-10-10 NOTE — Progress Notes (Signed)
CRITICAL VALUE ALERT  Critical value received:  Hgb= 6.5  Date of notification:  10/10/14  Time of notification:  0452  Critical value read back:Yes.    Nurse who received alert:  Dustin Flockhris Jayelle Page RN  MD notified (1st page):  Dr Bea LauraE Deterding  Time of first page:  0500  MD notified (2nd page):  Time of second page:  Responding MD:  Dr Bea LauraE Deterding  Time MD responded:  0500

## 2014-10-10 NOTE — Progress Notes (Signed)
eLink Physician-Brief Progress Note Patient Name: Abigail RutherfordDeborah A Mcbee DOB: 1951/02/16 MRN: [id number]   Date of Service  10/10/2014  HPI/Events of Note  Hgb 6.5 down from 8.1  eICU Interventions  Plan: Transfuse 1 unit pRBC Post-transfusion CBC     Intervention Category Intermediate Interventions: Bleeding - evaluation and treatment with blood products  Laportia Carley 10/10/2014, 4:59 AM

## 2014-10-11 ENCOUNTER — Inpatient Hospital Stay (HOSPITAL_COMMUNITY): Payer: Medicare Other

## 2014-10-11 DIAGNOSIS — N189 Chronic kidney disease, unspecified: Secondary | ICD-10-CM

## 2014-10-11 DIAGNOSIS — J15211 Pneumonia due to Methicillin susceptible Staphylococcus aureus: Secondary | ICD-10-CM

## 2014-10-11 DIAGNOSIS — Z992 Dependence on renal dialysis: Secondary | ICD-10-CM

## 2014-10-11 DIAGNOSIS — I809 Phlebitis and thrombophlebitis of unspecified site: Secondary | ICD-10-CM

## 2014-10-11 DIAGNOSIS — R7881 Bacteremia: Secondary | ICD-10-CM | POA: Insufficient documentation

## 2014-10-11 DIAGNOSIS — B9561 Methicillin susceptible Staphylococcus aureus infection as the cause of diseases classified elsewhere: Secondary | ICD-10-CM | POA: Insufficient documentation

## 2014-10-11 LAB — POCT I-STAT EG7
Acid-Base Excess: 15 mmol/L — ABNORMAL HIGH (ref 0.0–2.0)
Bicarbonate: 42.2 mEq/L — ABNORMAL HIGH (ref 20.0–24.0)
CALCIUM ION: 0.9 mmol/L — AB (ref 1.13–1.30)
HEMATOCRIT: 29 % — AB (ref 36.0–46.0)
HEMOGLOBIN: 9.9 g/dL — AB (ref 12.0–15.0)
O2 Saturation: 58 %
PH VEN: 7.431 — AB (ref 7.250–7.300)
PO2 VEN: 30 mmHg (ref 30.0–45.0)
Patient temperature: 97.6
Potassium: 3.9 mEq/L (ref 3.7–5.3)
Sodium: 135 mEq/L — ABNORMAL LOW (ref 137–147)
TCO2: 44 mmol/L (ref 0–100)
pCO2, Ven: 63.1 mmHg — ABNORMAL HIGH (ref 45.0–50.0)

## 2014-10-11 LAB — MAGNESIUM: Magnesium: 2.5 mg/dL (ref 1.5–2.5)

## 2014-10-11 LAB — RENAL FUNCTION PANEL
ANION GAP: 13 (ref 5–15)
Albumin: 1.4 g/dL — ABNORMAL LOW (ref 3.5–5.2)
BUN: 62 mg/dL — ABNORMAL HIGH (ref 6–23)
CALCIUM: 7.8 mg/dL — AB (ref 8.4–10.5)
CO2: 28 mEq/L (ref 19–32)
Chloride: 98 mEq/L (ref 96–112)
Creatinine, Ser: 2.81 mg/dL — ABNORMAL HIGH (ref 0.50–1.10)
GFR calc non Af Amer: 17 mL/min — ABNORMAL LOW (ref >90)
GFR, EST AFRICAN AMERICAN: 20 mL/min — AB (ref >90)
GLUCOSE: 200 mg/dL — AB (ref 70–99)
PHOSPHORUS: 4.9 mg/dL — AB (ref 2.3–4.6)
Potassium: 4.4 mEq/L (ref 3.7–5.3)
SODIUM: 139 meq/L (ref 137–147)

## 2014-10-11 LAB — HEPARIN INDUCED THROMBOCYTOPENIA PNL
Heparin Induced Plt Ab: NEGATIVE
Patient O.D.: 0.077
UFH High Dose UFH H: 0 % Release
UFH LOW DOSE 0.1 IU/ML: 0 %
UFH Low Dose 0.5 IU/mL: 0 % Release
UFH SRA Result: NEGATIVE

## 2014-10-11 LAB — CBC
HCT: 20.6 % — ABNORMAL LOW (ref 36.0–46.0)
HEMOGLOBIN: 6.6 g/dL — AB (ref 12.0–15.0)
MCH: 26.4 pg (ref 26.0–34.0)
MCHC: 32 g/dL (ref 30.0–36.0)
MCV: 82.4 fL (ref 78.0–100.0)
Platelets: 80 10*3/uL — ABNORMAL LOW (ref 150–400)
RBC: 2.5 MIL/uL — AB (ref 3.87–5.11)
RDW: 16.5 % — ABNORMAL HIGH (ref 11.5–15.5)
WBC: 7.6 10*3/uL (ref 4.0–10.5)

## 2014-10-11 LAB — BASIC METABOLIC PANEL
ANION GAP: 13 (ref 5–15)
BUN: 63 mg/dL — ABNORMAL HIGH (ref 6–23)
CHLORIDE: 99 meq/L (ref 96–112)
CO2: 27 mEq/L (ref 19–32)
Calcium: 7.8 mg/dL — ABNORMAL LOW (ref 8.4–10.5)
Creatinine, Ser: 2.78 mg/dL — ABNORMAL HIGH (ref 0.50–1.10)
GFR calc Af Amer: 20 mL/min — ABNORMAL LOW (ref >90)
GFR calc non Af Amer: 17 mL/min — ABNORMAL LOW (ref >90)
GLUCOSE: 204 mg/dL — AB (ref 70–99)
Potassium: 4.4 mEq/L (ref 3.7–5.3)
SODIUM: 139 meq/L (ref 137–147)

## 2014-10-11 LAB — GLUCOSE, CAPILLARY
GLUCOSE-CAPILLARY: 144 mg/dL — AB (ref 70–99)
Glucose-Capillary: 121 mg/dL — ABNORMAL HIGH (ref 70–99)
Glucose-Capillary: 164 mg/dL — ABNORMAL HIGH (ref 70–99)
Glucose-Capillary: 176 mg/dL — ABNORMAL HIGH (ref 70–99)
Glucose-Capillary: 184 mg/dL — ABNORMAL HIGH (ref 70–99)

## 2014-10-11 LAB — HEPATITIS B SURFACE ANTIGEN: Hepatitis B Surface Ag: NEGATIVE

## 2014-10-11 LAB — HEPATITIS B CORE ANTIBODY, TOTAL: Hep B Core Total Ab: NONREACTIVE

## 2014-10-11 LAB — APTT: APTT: 74 s — AB (ref 24–37)

## 2014-10-11 LAB — HEPATITIS B SURFACE ANTIBODY,QUALITATIVE

## 2014-10-11 LAB — PREPARE RBC (CROSSMATCH)

## 2014-10-11 MED ORDER — SODIUM CHLORIDE 0.9 % IV SOLN
Freq: Once | INTRAVENOUS | Status: AC
  Administered 2014-10-11: 06:00:00 via INTRAVENOUS

## 2014-10-11 MED ORDER — SODIUM CHLORIDE 0.9 % IV SOLN: INTRAVENOUS | Status: DC | PRN

## 2014-10-11 NOTE — Progress Notes (Addendum)
[facility]    Date of Admission:  10/01/2014   Total days of antibiotics 10        Day 8 cefazolin           ID: [name] is a 63 y.o. female with sepsis with MOF 2/2 MSSA bacteremia c/b cardiac arrest, presumed septi thrombophlebitis, thrombocytopenia  Active Problems:   DKA (diabetic ketoacidoses)   Cardiac arrest   Foreign body alimentary tract   Septic shock   Encephalopathy acute   Acute respiratory failure, unspecified whether with hypoxia or hypercapnia   AKI (acute kidney injury)   Anoxic brain injury   Community acquired pneumonia   Dehydration   Acute respiratory failure    Subjective: Remains afebrile, intubated, and now on HD  Medications:  . antiseptic oral rinse  7 mL Mouth Rinse QID  .  ceFAZolin (ANCEF) IV  1 g Intravenous Q24H  . chlorhexidine  15 mL Mouth Rinse BID  . docusate  50 mg Per Tube Daily  . insulin aspart  0-15 Units Subcutaneous 6 times per day  . insulin glargine  8 Units Subcutaneous Daily  . ipratropium-albuterol  3 mL Nebulization Q6H  . pantoprazole sodium  40 mg Per Tube Daily  . polyethylene glycol  17 g Per Tube Daily    Objective: Vital signs in last 24 hours: Temp:  [97.8 F (36.6 C)-98.9 F (37.2 C)] 97.9 F (36.6 C) (11/23 0730) Pulse Rate:  [80-112] 84 (11/23 0930) Resp:  [14-31] 19 (11/23 0930) BP: (93-149)/(41-65) 125/55 mmHg (11/23 0930) SpO2:  [98 %-100 %] 100 % (11/23 0800) Arterial Line BP: (104-175)/(46-80) 139/60 mmHg (11/23 0730) FiO2 (%):  [40 %] 40 % (11/23 0800) Weight:  [171 lb 15.3 oz (78 kg)-175 lb 0.7 oz (79.4 kg)] 175 lb 0.7 oz (79.4 kg) (11/23 0730) Physical Exam  Constitutional:  sedated. appears well-developed and well-nourished. No distress.  HENT:  Mouth/Throat: ett in place Cardiovascular: Normal rate, regular rhythm and normal heart sounds. Exam reveals no gallop and no friction rub.  No murmur heard.  Pulmonary/Chest: Effort normal and breath sounds  normal. No respiratory distress.  has no wheezes.  Abdominal: Soft. Bowel sounds are normal.  exhibits no distension. There is no tenderness.  Skin: Skin is warm and dry. No rash noted. No erythema.   Lab Results  Recent Labs  10/10/14 1535 10/10/14 1825 10/11/14 0440  WBC  --  7.9 7.6  HGB  --  7.2* 6.6*  HCT  --  22.4* 20.6*  NA 137  --  139  139  K 5.0  --  4.4  4.4  CL 98  --  98  99  CO2 27  --  28  27  BUN 48*  --  62*  63*  CREATININE 2.30*  --  2.81*  2.78*   Liver Panel  Recent Labs  10/10/14 0410 10/11/14 0440  ALBUMIN 1.4* 1.4*    Microbiology: 11/13 blood cx 1 of 2 mssa 11/17 blood cx ngtd Studies/Results: Dg Chest Portable 1 View  10/11/2014   CLINICAL DATA:  Pulmonary edema  EXAM: PORTABLE CHEST - 1 VIEW  COMPARISON:  10/09/2014  FINDINGS: There is borderline cardiomegaly which is stable from previous. Aortic and hilar contours stable.  Endotracheal tube tip just below the clavicular heads. Gastric suction tube enters the stomach. There is a right IJ catheter, tip at the SVC.  Bilateral nodular airspace disease is stable from yesterday. Dense consolidation  in the left lower lobe is also stable. Small bilateral pleural effusions without evidence of increase. No air leak.  IMPRESSION: 1. Stable positioning of tubes and central line. 2. Stable appearance of bilateral pneumonia.   Electronically Signed   By: Tiburcio PeaJonathan  Watts M.D.   On: 10/11/2014 06:14     Assessment/Plan: Complicated mssa bacteremia with sepsis, presumed septic thrombophlebitis = recommend to treat for a total of 6 wk with cefazolin (renally dosed) using 11/17 as day. She has had extensive work up for disseminated disease, TEE cleared  MSSA pneumonia = continue on cefazolin  Respiratory status = planned for extubation today per primary team  CKD = now on HD, continue with renal dosage of cefazolin   Will sign off  Rohan Juenger, [facility] Cell:  [phone] Pager: [phone]  10/11/2014, 9:46 AM

## 2014-10-11 NOTE — Progress Notes (Signed)
NUTRITION FOLLOW UP   Intervention:   Recommend diet advancement as tolerated to regular, unless swallowing concerns exist, then recommend swallow evaluation with SLP.   Nutrition Dx:   Inadequate oral intake related to inability to eat as evidenced by NPO status, ongoing.  Goal:   Intake to meet >90% of estimated nutrition needs. Unmet.  Monitor:   Diet advancement, PO intake, labs, weight trend.  Assessment:   63 year old female with PMH of diabetes and medical non-compliance. Husband unable to arouse patient on the morning of admission. In the ED patient became unresponsive and was intubated. Pt presents with AMS, DKA, and VDRF.  Discussed patient in ICU rounds today. S/P first HD today. TF off since this AM for extubation. Patient was just extubated earlier today, remains NPO for now.   Height: Ht Readings from Last 1 Encounters:  10/02/14 5\' 4"  (1.626 m)    Weight Status:  Trending back down  Wt Readings from Last 1 Encounters:  10/11/14 169 lb 12.1 oz (77 kg)   10/07/14 181 lb 7 oz (82.3 kg)   10/01/14 144 lb 13.5 oz (65.7 kg)    Re-estimated needs:  Kcal: 1600-1800 Protein: 80-90 gm Fluid: 1.6-1.8 L  Skin: WNL  Diet Order: Diet NPO time specified   Intake/Output Summary (Last 24 hours) at 10/11/14 1529 Last data filed at 10/11/14 1130  Gross per 24 hour  Intake 1702.39 ml  Output   3235 ml  Net -1532.61 ml    Last BM: 11/22  Labs:   Recent Labs Lab 10/09/14 0410 10/09/14 1710 10/10/14 0410 10/10/14 1535 10/11/14 0440  NA 138 136* 135* 137 139  139  K 4.8 4.9 5.2 5.0 4.4  4.4  CL 97 98 97 98 98  99  CO2 31 28 27 27 28  27   BUN 22 25* 38* 48* 62*  63*  CREATININE 1.14* 1.19* 1.69* 2.30* 2.81*  2.78*  CALCIUM 8.4 8.1* 7.9* 8.0* 7.8*  7.8*  MG 2.2  --  2.3  --  2.5  PHOS 2.0* 2.5 2.7  --  4.9*  GLUCOSE 176* 154* 193* 165* 200*  204*    CBG (last 3)   Recent Labs  10/11/14 0404 10/11/14 0810 10/11/14 1203  GLUCAP 184*  164* 121*    Scheduled Meds: . antiseptic oral rinse  7 mL Mouth Rinse QID  .  ceFAZolin (ANCEF) IV  1 g Intravenous Q24H  . chlorhexidine  15 mL Mouth Rinse BID  . docusate  50 mg Per Tube Daily  . insulin aspart  0-15 Units Subcutaneous 6 times per day  . insulin glargine  8 Units Subcutaneous Daily  . ipratropium-albuterol  3 mL Nebulization Q6H  . pantoprazole sodium  40 mg Per Tube Daily  . polyethylene glycol  17 g Per Tube Daily    Continuous Infusions: . sodium chloride 10 mL/hr at 10/10/14 0345  . argatroban 45 mcg/min (10/11/14 0600)  . feeding supplement (VITAL HIGH PROTEIN) 1,000 mL (10/10/14 2130)  . fentaNYL infusion INTRAVENOUS 75 mcg/hr (10/11/14 0600)    Joaquin CourtsKimberly Harris, RD, LDN, CNSC Pager [phone] After Hours Pager [phone]

## 2014-10-11 NOTE — Progress Notes (Signed)
ANTICOAGULATION & ANTIBIOTIC CONSULT NOTE - Follow Up Consult  Pharmacy Consult for Argatroban + Cefazolin Indication: SVC thrombus, r/o HIT and MSSA bacteremia  Allergies  Allergen Reactions  . Penicillins     Rash     Patient Measurements: Height: 5\' 4"  (162.6 cm) Weight: 169 lb 12.1 oz (77 kg) IBW/kg (Calculated) : 54.7  Vital Signs: Temp: 97.1 F (36.2 C) (11/23 1205) Temp Source: Axillary (11/23 1205) BP: 122/50 mmHg (11/23 1130) Pulse Rate: 76 (11/23 1130)  Labs:  Recent Labs  10/09/14 1710 10/10/14 0410 10/10/14 1535 10/10/14 1825 10/11/14 0440  HGB  --  6.5*  --  7.2* 6.6*  HCT  --  20.5*  --  22.4* 20.6*  PLT  --  68*  --  72* 80*  APTT 78* 75*  --   --  74*  CREATININE 1.19* 1.69* 2.30*  --  2.81*  2.78*    Estimated Creatinine Clearance: 21.1 mL/min (by C-G formula based on Cr of 2.81).   Medications:  Argatroban at 45 mcg/min  Assessment: 63 yo female with clot in superior vena cava extending to the R atrium and multiple other clots in arm on doppler. Pt is in acute renal failure on CRRT, switched to HD (11/21) without set schedule yet. Pt received heparin at prophylaxis doses and heparin with CRRT from 11/13 through 11/18, when all heparin products were stopped due to declining platelets. Plts 252 > 56. 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   HIT test still pending. aPTT level this morning is therapeutic (aPTT 78 >> 75 >> 74), higher goal of 70-90 requested per CCM). Hgb/Hct low - 6.6/20.6 (s/p 2 PRBC), may be related to blood loss with stopping CRRT, plts increasing, 68 >> 80. No bleeding noted per nurse.  The patient also continues on Cefazolin for MSSA bacteremia/PNA - TEE negative for endocarditis, ID on board. The patient stopped CRRT on 11/21 evening and will transition to IHD starting on 11/23. Since the patient's HD schedule is unknown (per renal, not an  outpatient candidate) - will adjust Cefazolin to q24h dosing for now.  Goal of Therapy:  aPTT 50-90 seconds (higher range of 70-90 seconds requested per CCM) Monitor platelets by anticoagulation protocol: Yes Proper antibiotics for infection/cultures adjusted for renal/hepatic function    Plan:  1. Continue argatroban at 45 mcg/min 2. Monitor aPTT daily now that patient is at goal level 3. Continue Cefazolin 1g IV every 24 hours-adjust once HD schedule set 4. Will continue to monitor for any signs/symptoms of bleeding. Will continue to follow renal function, culture results, LOT, and antibiotic de-escalation plans   Gillermo Murdochuhani Alam, PharmD Candidate  10/11/14   I have reviewed above and agree with assessment and plan.  Patient tolerated HD today for 4 hours at a BFR of 350-400. Still unsure of HD plan. Will continue current Ancef 1g IV q24h dosing.   Link SnufferJessica Jossilyn Benda, PharmD, BCPS Clinical Pharmacist [phone] 10/11/2014, 1:46 PM

## 2014-10-11 NOTE — Progress Notes (Signed)
I have seen and examined this patient and agree with the plan of care . Patient seen receiving intermittent dialysis oliguric this morning. Hb 6.6 (7.2 yesterday )  . Creatinine 2.8  ( 2.3 ) no hyperkalemia . Volume control Fi O2  40 %  Alpa Salvo W 10/11/2014, 11:05 AM

## 2014-10-11 NOTE — Consult Note (Signed)
Still on vent but attempting wean.  Still requiring dialysis. Right hand continues to improve.  Tip of right second digit dusky but less than a few days ago.  Will sign off  Fabienne Brunsharles Bellarae Lizer, MD [facility] Office: [phone] Pager: [phone]

## 2014-10-11 NOTE — Progress Notes (Signed)
50 mg of fentenyl wasted with Hazel Samshristian Smith RN

## 2014-10-11 NOTE — Procedures (Signed)
Extubation Procedure Note  Patient Details:   Name: [name] DOB: October 05, 1951 MRN: [id number]   Airway Documentation:     Evaluation  O2 sats: stable throughout Complications: No apparent complications Patient did tolerate procedure well. Bilateral Breath Sounds: Rhonchi Suctioning: Airway Yes   Patient extubated to 3L nasal cannula.  Positive cuff leak noted.  No evidence of stridor.  Patient did not wish to speak post extubation or perform incentive spirometry.  Sats currently 99%.  Vitals are stable.  RT will continue to monitor.   Ledell NossBrown, Christpoher Sievers N 10/11/2014, 12:06 PM

## 2014-10-11 NOTE — Progress Notes (Signed)
PULMONARY / CRITICAL CARE MEDICINE   Name: [name] MRN: [id number] DOB: [birth year]    ADMISSION DATE:  10/01/2014 CONSULTATION DATE:  10/01/2014  REFERRING MD :  EDP  CHIEF COMPLAINT:  DKA, respiratory failure and AMS  INITIAL PRESENTATION: 63 year old female with PMH of diabetes and medical non-compliance who was last seen normal the day PTP at 8 PM.  Husband woke up this AM and was unable to arouse patient.  Evidently both have been "fighting a cold" but patient was coughing more the night before.  EMS was called and patient was brought to the ED.  In the ED patient became unresponsive and was intubated by EDP.  PCCM was called to admit.  STUDIES:  11/13 CXR with aspiration PNA and abdominal x-ray ? dentures in her lower abdomen. 11/13 CT abd/pelvis, no foreign body noted ? Overlap of bowel walls, neg for obstruction  11/14 Echo >EF 35-40%, small pericardial effusion  11/15 Arterial doppler RUE occlusion, per vasc 11/16- renal US, ascites, no hydro 11/17 ct head>>>max sinusitis, no acute 11/18 ct abdo/pelvis>>> 11/17 eeg>>>gen slowing c/w met enceph 11/18 CT Chest/Abd/Pelvis>> no abscess, small effusions, not loculated, infiltrates bilateral 11/19 TEE>no veg, clot svc, ra and on HD cath, 25% 11/20 upper ext dopplers>>>RUE DVT  11/20 lowers>>>superficial thrombus bilat saphenous   SIGNIFICANT EVENTS: 11/13 Presenting with AMS, DKA and VDRF 11/16- remeain in shock, renal failure 11/16- cvvhd 11/18- SVC clot, all heparin stopped, argatroban started, HD cath changed 11/23 Transfused one RBCs for Hgb 6.6 11/23 Extubated  SUBJECTIVE:  Passed SBT. + F/C. Extubated and tolerating well   VITAL SIGNS: Temp:  [97.1 F (36.2 C)-98.9 F (37.2 C)] 98.9 F (37.2 C) (11/23 1942) Pulse Rate:  [76-100] 79 (11/23 1900) Resp:  [14-28] 20 (11/23 1900) BP: (91-149)/(44-71) 102/53 mmHg (11/23 1900) SpO2:  [96 %-100 %] 97 % (11/23 1928) Arterial Line BP: (105-153)/(46-66)  123/53 mmHg (11/23 1900) FiO2 (%):  [40 %] 40 % (11/23 1100) Weight:  [77 kg (169 lb 12.1 oz)-79.4 kg (175 lb 0.7 oz)] 77 kg (169 lb 12.1 oz) (11/23 1130) HEMODYNAMICS: CVP:  [0 mmHg-6 mmHg] 0 mmHg VENTILATOR SETTINGS: Vent Mode:  [-] PSV;CPAP FiO2 (%):  [40 %] 40 % Set Rate:  [12 bmp] 12 bmp Vt Set:  [440 mL] 440 mL PEEP:  [5 cmH20] 5 cmH20 Pressure Support:  [5 cmH20] 5 cmH20 Plateau Pressure:  [16 cmH20-19 cmH20] 19 cmH20 INTAKE / OUTPUT:  Intake/Output Summary (Last 24 hours) at 10/11/14 2048 Last data filed at 10/11/14 1900  Gross per 24 hour  Intake 1799.31 ml  Output   3295 ml  Net -1495.69 ml    PHYSICAL EXAMINATION: General: NAD Neuro: RASS 0, + F/C, no focal deficits, diffusely weak HEENT:WNL Cardiovascular:  Re, no M. Lungs: No wheezes Abdomen:  Soft, NT, ND and +BS. Ext: no edema, warm  LABS: I have reviewed all of today's lab results. 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% Extensive SVC/RA clot P:  Monitor of pressors MAP goal > 60 mmHg  RENAL A:   AKI, oliguric P:   Monitor BMET intermittently Monitor I/Os Correct electrolytes as indicated HD  per renal  GASTROINTESTINAL A:   Severe protein-calorie malnutrition P: -  SUP: PPI NPO post extubation Advance TFs as tolerated  HEMATOLOGIC A:   Anemia Thrombocytopenia, 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 RUE DVT, SVC/RA clot P:  Cont argatroban Monitor CBC intermittently Transfuse per usual ICU guidelines  INFECTIOUS A:   Severe sepsis  MSSA bacteremia P:   Micro results reviewed in detail Cont cefazolin through 12/31   ENDOCRINE A:   DKA, resolved IDDM P:   Cont  Lantus, SSI   NEUROLOGIC A:   Acute encephalopathy, much improved Severe deconditioning P:   RASS goal: 0 Minimize sedatives/opioids  FAMILY     40 mins CCM time  Merton Border, MD ; [facility] service [place] [phone].  After 5:30 PM or weekends, call [phone]

## 2014-10-11 NOTE — Care Management Note (Signed)
    Page 1 of 2   10/12/2014     12:01:12 PM CARE MANAGEMENT NOTE 10/12/2014  [facility]   Account Number:  [id number]  Date Initiated:  10/04/2014  Documentation initiated by:  [facility],Marieke Lubke  Subjective/Objective Assessment:   Admitted with resp - became unresponsive.  Intubated. DKA - on insulin drip.     Action/Plan:   Anticipated DC Date:  10/11/2014   Anticipated DC Plan:  HOME W HOME HEALTH SERVICES      DC Planning Services  CM consult      Choice offered to / List presented to:             Status of service:  In process, will continue to follow Medicare Important Message given?  YES (If response is "NO", the following Medicare IM given date fields will be blank) Date Medicare IM given:  10/12/2014 Medicare IM given by:  [facility],Delsa Walder Date Additional Medicare IM given:   Additional Medicare IM given by:    Discharge Disposition:    Per UR Regulation:  Reviewed for med. necessity/level of care/duration of stay  If discussed at Long Length of Stay Meetings, dates discussed:   10/07/2014  10/12/2014    Comments:  Contact:  Lowe,Richard Significant other [phone]  [phone]  10-12-14 112noon  Avie ArenasSarah Vonzell Lindblad, RNBSN - [phone] Talked with patient about Ltachs and progression.  Appears to understand - nods appropriately and has appropriate facial expressions but did not speak.  I understand from patient that she is ok with Ltach progression - prefers [place], [facility] and would like me to talk with her father. TAlked with father and would like me to talk with his other daughter, Okey RegalCarol.  Talked with Okey Regalarol, she also feels that an Ltach is a good option and would like [facility].  [facility] updated. CM will continue to follow.  10-11-14  9am Avie ArenasSarah Lindey Renzulli, RNBSN - [phone] Off CRRT and pressors - remains on vent, weak.   Would be good Ltach candidat.  10-07-14 3:50pm Avie ArenasSarah Neomia Herbel, RNBSN - [phone] Remains on vent, CRRT and pressors.   10-04-14 9:10am  Avie ArenasSarah Camyah Pultz, RNBSN [phone] Depending on progression - may be Ltach candidate.

## 2014-10-11 NOTE — Consult Note (Signed)
WOC wound consult note Reason for Consult: evaluation of left heel ulcer Wound type: HAPU, sDTI (supected deep tissue injury) Pressure Ulcer POA: No Measurement: 3cm x 3cm x 0 Wound bed: deep, dark purple tissue with central darker area, skin intact.  Consistent with deep tissue injury.  Drainage (amount, consistency, odor) none Periwound:intact Dressing procedure/placement/frequency: Prevalon boot initiated by ICU staff at the time this ulcer was discovered 10/09/14.  Silicone foam in place.  Continue with offloading and soft foam for protection.  If complete pressure relief deep tissue injury may resolve.  WOC will monitor for evolution of wound.  WOC will follow along with you for weekly assessments.  Willeen Novak [place] RN,CWOCN [phone]

## 2014-10-11 NOTE — Progress Notes (Signed)
eLink Physician-Brief Progress Note Patient Name: Abigail RutherfordDeborah A Zamora DOB: 05/20/1951 MRN: [id number]   Date of Service  10/11/2014  HPI/Events of Note  Hgb 6.6, not bleeding; PLT count coming up on argatroban  eICU Interventions  PRBC 1 U now Maintain argatroban for now     Intervention Category Intermediate Interventions: Bleeding - evaluation and treatment with blood products  Shiori Adcox 10/11/2014, 5:54 AM

## 2014-10-12 DIAGNOSIS — I6340 Cerebral infarction due to embolism of unspecified cerebral artery: Secondary | ICD-10-CM

## 2014-10-12 DIAGNOSIS — R5381 Other malaise: Secondary | ICD-10-CM

## 2014-10-12 LAB — CBC
HEMATOCRIT: 24.1 % — AB (ref 36.0–46.0)
HEMOGLOBIN: 7.9 g/dL — AB (ref 12.0–15.0)
MCH: 27.8 pg (ref 26.0–34.0)
MCHC: 32.8 g/dL (ref 30.0–36.0)
MCV: 84.9 fL (ref 78.0–100.0)
Platelets: 92 10*3/uL — ABNORMAL LOW (ref 150–400)
RBC: 2.84 MIL/uL — ABNORMAL LOW (ref 3.87–5.11)
RDW: 16 % — ABNORMAL HIGH (ref 11.5–15.5)
WBC: 5.7 10*3/uL (ref 4.0–10.5)

## 2014-10-12 LAB — LIPID PANEL
Cholesterol: 130 mg/dL (ref 0–200)
HDL: 23 mg/dL — ABNORMAL LOW (ref >39)
LDL CALC: 77 mg/dL (ref 0–99)
Total CHOL/HDL Ratio: 5.7 RATIO
Triglycerides: 150 mg/dL — ABNORMAL HIGH (ref <150)
VLDL: 30 mg/dL (ref 0–40)

## 2014-10-12 LAB — TYPE AND SCREEN
ABO/RH(D): B POS
Antibody Screen: NEGATIVE
UNIT DIVISION: 0
Unit division: 0

## 2014-10-12 LAB — GLUCOSE, CAPILLARY
GLUCOSE-CAPILLARY: 107 mg/dL — AB (ref 70–99)
GLUCOSE-CAPILLARY: 122 mg/dL — AB (ref 70–99)
GLUCOSE-CAPILLARY: 122 mg/dL — AB (ref 70–99)
GLUCOSE-CAPILLARY: 85 mg/dL (ref 70–99)
Glucose-Capillary: 185 mg/dL — ABNORMAL HIGH (ref 70–99)
Glucose-Capillary: 144 mg/dL — ABNORMAL HIGH (ref 70–99)
Glucose-Capillary: 126 mg/dL — ABNORMAL HIGH (ref 70–99)
Glucose-Capillary: 123 mg/dL — ABNORMAL HIGH (ref 70–99)

## 2014-10-12 LAB — CULTURE, BLOOD (ROUTINE X 2)
Culture: NO GROWTH
Culture: NO GROWTH

## 2014-10-12 LAB — RENAL FUNCTION PANEL
ALBUMIN: 1.4 g/dL — AB (ref 3.5–5.2)
Anion gap: 15 (ref 5–15)
BUN: 37 mg/dL — AB (ref 6–23)
CHLORIDE: 101 meq/L (ref 96–112)
CO2: 24 mEq/L (ref 19–32)
CREATININE: 2.25 mg/dL — AB (ref 0.50–1.10)
Calcium: 7.7 mg/dL — ABNORMAL LOW (ref 8.4–10.5)
GFR calc Af Amer: 26 mL/min — ABNORMAL LOW (ref >90)
GFR calc non Af Amer: 22 mL/min — ABNORMAL LOW (ref >90)
GLUCOSE: 114 mg/dL — AB (ref 70–99)
Phosphorus: 4.1 mg/dL (ref 2.3–4.6)
Potassium: 3.9 mEq/L (ref 3.7–5.3)
Sodium: 140 mEq/L (ref 137–147)

## 2014-10-12 LAB — APTT: aPTT: 67 seconds — ABNORMAL HIGH (ref 24–37)

## 2014-10-12 LAB — MAGNESIUM: MAGNESIUM: 2.1 mg/dL (ref 1.5–2.5)

## 2014-10-12 MED ORDER — INSULIN GLARGINE 100 UNIT/ML ~~LOC~~ SOLN
5.0000 [IU] | Freq: Every day | SUBCUTANEOUS | Status: DC
  Administered 2014-10-13: 5 [IU] via SUBCUTANEOUS
  Filled 2014-10-12: qty 0.05

## 2014-10-12 MED ORDER — CHLORHEXIDINE GLUCONATE 0.12 % MT SOLN
15.0000 mL | Freq: Two times a day (BID) | OROMUCOSAL | Status: DC
  Administered 2014-10-12 – 2014-10-13 (×2): 15 mL via OROMUCOSAL
  Filled 2014-10-12 (×2): qty 15

## 2014-10-12 MED ORDER — CETYLPYRIDINIUM CHLORIDE 0.05 % MT LIQD
7.0000 mL | Freq: Two times a day (BID) | OROMUCOSAL | Status: DC
  Administered 2014-10-13: 7 mL via OROMUCOSAL

## 2014-10-12 NOTE — Evaluation (Signed)
Clinical/Bedside Swallow Evaluation Patient Details  Name: Abigail RutherfordDeborah A Dettmer MRN: [id number] Date of Birth: May 22, 1951  Today's Date: 10/12/2014 Time: [id number] SLP Time Calculation (min) (ACUTE ONLY): 19 min  Past Medical History:  Past Medical History  Diagnosis Date  . Mixed stress and urge urinary incontinence   . Schizophrenia   . Allergy    Past Surgical History: History reviewed. No pertinent past surgical history. HPI:  63 year old female with PMH of diabetes, schizophrenia and medical non-compliance admitted 11/13 with decreased arousal and found to have DKA, respiratory failure, severe metabolic acidosis. CXR 11/13 revealed pna.Intubated 11/13-11/23.  CXR Stable appearance of bilateral pneumonia.   Assessment / Plan / Recommendation Clinical Impression  Pt. exhibited oral and pharyngeal dysphagia based on current cognitive impairments and prolonged intubation (10 days). Difficulty performing motor commands with intermittently no response due to diagnosed encephalopathy. Max verbal cues for vocalization x 1 with hoarse and significantly decreased vocal intensity.  Cognitive deficits prevented consistent swallow initiation and required oral suctioning x 1.  Suspect delayed swallow initiation and possible decreased sensation.  Pt would benefit from objective assessment to determine safest po consistency (FEES recommended).        Aspiration Risk  Moderate    Diet Recommendation NPO        Other  Recommendations Oral Care Recommendations: Oral care BID   Follow Up Recommendations   (TBD)    Frequency and Duration min 2x/week  2 weeks   Pertinent Vitals/Pain No pain indicated         Swallow Study       Oral/Motor/Sensory Function Overall Oral Motor/Sensory Function:  (unable to volitionaly perform)   Ice Chips Ice chips: Impaired Presentation: Spoon Oral Phase Impairments: Reduced labial seal;Reduced lingual movement/coordination;Poor awareness of  bolus;Impaired anterior to posterior transit Oral Phase Functional Implications: Oral holding;Oral residue Pharyngeal Phase Impairments: Unable to trigger swallow (no swallow initiated)   Thin Liquid Thin Liquid: Not tested    Nectar Thick Nectar Thick Liquid: Not tested   Honey Thick Honey Thick Liquid: Not tested   Puree Puree: Impaired Presentation: Spoon Oral Phase Impairments: Reduced labial seal;Reduced lingual movement/coordination;Impaired anterior to posterior transit Oral Phase Functional Implications: Prolonged oral transit;Oral holding Pharyngeal Phase Impairments: Suspected delayed Swallow   Solid   GO    Solid: Not tested       Royce MacadamiaLitaker, Micheale Schlack Willis 10/12/2014,3:28 PM  Breck CoonsLisa Willis Lonell FaceLitaker M.Ed [employer] Pager [phone]

## 2014-10-12 NOTE — Consult Note (Signed)
Referring Physician: Nehemiah Massed, MD    Chief Complaint: concern for a stroke  HPI:                                                                                                                                         [name] is an 63 y.o. female with PMH of diabetes and medical non-compliance who was last seen normal on 09/30/2014 at 8 PM. Husband woke up in AM and was unable to arouse patient.They had both have been "fighting a cold" but patient was coughing more the night before presentation. EMS was called and patient was brought to the ED.In the ED patient became unresponsive and was intubated by EDP and was extubated on 10/11/2014. Initial report on head ct scan wo on 11/13 was normal. 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 Of note patient has evidence of Right radial artery appears occluded distally, right axillary DVT, bilateral cephalic vein thrombosis and bilateral greater saphenous venous thrombosis.  Significant studies and events while in the ICU. 11/13 CXR with aspiration PNA and abdominal x-ray ? dentures in her lower abdomen. 11/13 CT abd/pelvis, no foreign body noted ? Overlap of bowel walls, neg for obstruction  11/14 Echo >EF 35-40%, small pericardial effusion  11/15 Arterial doppler RUE occlusion, per vasc 11/16- renal US, ascites, no hydro 11/17 ct head>>>max sinusitis, no acute 11/17 eeg>>>gen slowing c/w met enceph 11/18 CT Chest/Abd/Pelvis>> no abscess, small effusions, not loculated, infiltrates bilateral 11/19 TEE>no veg, clot svc, ra and on HD cath, 25% 11/20 upper ext dopplers>>>RUE DVT  11/20 lowers>>>superficial thrombus bilat saphenous   SIGNIFICANT EVENTS: 11/13 Presenting with AMS, DKA and VDRF 11/16- remeain in shock, renal failure 11/16- cvvhd 11/18- SVC clot, all heparin stopped, argatroban started, HD cath changed 11/23  Transfused one RBCs for Hgb 6.6 11/23 Extubated  Date last known well: Date: 09/30/2014 Time last known well: Time: 20:00 tPA Given: No: outside the window NIHSS:  MRS:   Past Medical History  Diagnosis Date  . Mixed stress and urge urinary incontinence   . Schizophrenia   . Allergy     History reviewed. No pertinent past surgical history.  Family History  Problem Relation Age of Onset  . Heart disease Mother   . Emphysema Paternal Grandfather   . Hypertension Father   . Stroke Father   . Heart disease Father    Social History:  reports that she has quit smoking. Her smoking use included Cigarettes. She smoked 3.00 packs per day. She does not have any smokeless tobacco history on file. She reports that she drinks about 3.6 oz of alcohol per week. Her drug history is not on file.  Allergies:  Allergies  Allergen Reactions  . Penicillins     Rash     Medications:  Scheduled: . antiseptic oral rinse  7 mL Mouth Rinse QID  .  ceFAZolin (ANCEF) IV  1 g Intravenous Q24H  . chlorhexidine  15 mL Mouth Rinse BID  . [START ON 10/13/2014] insulin glargine  5 Units Subcutaneous Daily  . ipratropium-albuterol  3 mL Nebulization Q6H   Continuous: . sodium chloride 10 mL/hr at 10/10/14 0345  . argatroban 45 mcg/min (10/12/14 1214)    ROS:                                                                                                                                       History obtained from chart review and unobtainable from patient due to mental status  General ROS: negative for - chills, fatigue, fever, night sweats, weight gain or weight loss Psychological ROS: negative for - behavioral disorder, hallucinations, memory difficulties, mood swings or suicidal ideation Ophthalmic ROS: negative for - blurry vision, double vision, eye pain or loss of  vision ENT ROS: negative for - epistaxis, nasal discharge, oral lesions, sore throat, tinnitus or vertigo Allergy and Immunology ROS: negative for - hives or itchy/watery eyes Hematological and Lymphatic ROS: negative for - bleeding problems, bruising or swollen lymph nodes Endocrine ROS: negative for - galactorrhea, hair pattern changes, polydipsia/polyuria or temperature intolerance Respiratory ROS: negative for - cough, hemoptysis, shortness of breath or wheezing Cardiovascular ROS: negative for - chest pain, dyspnea on exertion, edema or irregular heartbeat Gastrointestinal ROS: negative for - abdominal pain, diarrhea, hematemesis, nausea/vomiting or stool incontinence Genito-Urinary ROS: negative for - dysuria, hematuria, incontinence or urinary frequency/urgency Musculoskeletal ROS: negative for - joint swelling or muscular weakness Neurological ROS: as noted in HPI Dermatological ROS: negative for rash and skin lesion changes  Neurologic Examination:                                                                                                      Blood pressure 94/39, pulse 81, temperature 97.8 F (36.6 C), temperature source Oral, resp. rate 23, height '5\' 4"'  (1.626 m), weight 162 lb 4.1 oz (73.6 kg), SpO2 97 %.   General: Mental Status: AMS. Unable to speak due to lethargy. Able to obey simple commands like "close/open your eyes" but would not wiggle her toes, move her arms or open her mouth.   Cranial Nerves: II: pupils equal, round, reactive to light. Visual fields not tested due to patient condition. III,IV, VI: ptosis not present, extra-ocular motions intact bilaterally VIII: hearing normal bilaterally XI: bilateral shoulder shrug  Motor: Right : Upper extremity   0/5    Left:     Upper extremity   0/5  Lower extremity   0/5     Lower extremity   0/5 Tone and bulk:normal tone throughout; no atrophy noted Deep Tendon Reflexes:  Right: Upper Extremity   Left: Upper  extremity   biceps (C-5 to C-6) 2/4   biceps (C-5 to C-6) 2/4 tricep (C7) 2/4    triceps (C7) 2/4 Brachioradialis (C6) 2/4  Brachioradialis (C6) 2/4  Lower Extremity Lower Extremity  quadriceps (L-2 to L-4) 1/4   quadriceps (L-2 to L-4) 1/4 Achilles (S1) >>not tested     Plantars: Right: downgoing   Left: downgoing Cerebellar: Not tested Gait: not tested CV: pulses palpable throughout    Results for orders placed or performed during the hospital encounter of 10/01/14 (from the past 48 hour(s))  Glucose, capillary     Status: Abnormal   Collection Time: 10/10/14  3:21 PM  Result Value Ref Range   Glucose-Capillary 144 (H) 70 - 99 mg/dL  Basic metabolic panel     Status: Abnormal   Collection Time: 10/10/14  3:35 PM  Result Value Ref Range   Sodium 137 137 - 147 mEq/L   Potassium 5.0 3.7 - 5.3 mEq/L   Chloride 98 96 - 112 mEq/L   CO2 27 19 - 32 mEq/L   Glucose, Bld 165 (H) 70 - 99 mg/dL   BUN 48 (H) 6 - 23 mg/dL   Creatinine, Ser 2.30 (H) 0.50 - 1.10 mg/dL   Calcium 8.0 (L) 8.4 - 10.5 mg/dL   GFR calc non Af Amer 22 (L) >90 mL/min   GFR calc Af Amer 25 (L) >90 mL/min    Comment: (NOTE) The eGFR has been calculated using the CKD EPI equation. This calculation has not been validated in all clinical situations. eGFR's persistently <90 mL/min signify possible Chronic Kidney Disease.    Anion gap 12 5 - 15  Urinalysis, Routine w reflex microscopic     Status: Abnormal   Collection Time: 10/10/14  5:53 PM  Result Value Ref Range   Color, Urine YELLOW YELLOW   APPearance TURBID (A) CLEAR   Specific Gravity, Urine 1.016 1.005 - 1.030   pH 6.0 5.0 - 8.0   Glucose, UA NEGATIVE NEGATIVE mg/dL   Hgb urine dipstick LARGE (A) NEGATIVE   Bilirubin Urine SMALL (A) NEGATIVE   Ketones, ur 15 (A) NEGATIVE mg/dL   Protein, ur 100 (A) NEGATIVE mg/dL   Urobilinogen, UA 0.2 0.0 - 1.0 mg/dL   Nitrite NEGATIVE NEGATIVE   Leukocytes, UA MODERATE (A) NEGATIVE  Urine microscopic-add on      Status: Abnormal   Collection Time: 10/10/14  5:53 PM  Result Value Ref Range   Squamous Epithelial / LPF FEW (A) RARE   WBC, UA TOO NUMEROUS TO COUNT <3 WBC/hpf   RBC / HPF TOO NUMEROUS TO COUNT <3 RBC/hpf   Bacteria, UA MANY (A) RARE   Casts GRANULAR CAST (A) NEGATIVE   Urine-Other MANY YEAST     Comment: AMORPHOUS URATES/PHOSPHATES  Culture, respiratory (NON-Expectorated)     Status: None (Preliminary result)   Collection Time: 10/10/14  5:58 PM  Result Value Ref Range   Specimen Description ENDOTRACHEAL    Special Requests NONE    Gram Stain      RARE WBC PRESENT, PREDOMINANTLY PMN RARE SQUAMOUS EPITHELIAL CELLS PRESENT RARE GRAM POSITIVE COCCI IN CLUSTERS Performed at [facility]  Culture      MODERATE STAPHYLOCOCCUS AUREUS Note: RIFAMPIN AND GENTAMICIN SHOULD NOT BE USED AS SINGLE DRUGS FOR TREATMENT OF STAPH INFECTIONS. Performed at [facility]    Report Status PENDING   CBC with Differential     Status: Abnormal   Collection Time: 10/10/14  6:25 PM  Result Value Ref Range   WBC 7.9 4.0 - 10.5 K/uL   RBC 2.70 (L) 3.87 - 5.11 MIL/uL   Hemoglobin 7.2 (L) 12.0 - 15.0 g/dL   HCT 22.4 (L) 36.0 - 46.0 %   MCV 83.0 78.0 - 100.0 fL   MCH 26.7 26.0 - 34.0 pg   MCHC 32.1 30.0 - 36.0 g/dL   RDW 16.3 (H) 11.5 - 15.5 %   Platelets 72 (L) 150 - 400 K/uL    Comment: CONSISTENT WITH PREVIOUS RESULT   Neutrophils Relative % 88 (H) 43 - 77 %   Neutro Abs 7.0 1.7 - 7.7 K/uL   Lymphocytes Relative 7 (L) 12 - 46 %   Lymphs Abs 0.6 (L) 0.7 - 4.0 K/uL   Monocytes Relative 4 3 - 12 %   Monocytes Absolute 0.3 0.1 - 1.0 K/uL   Eosinophils Relative 0 0 - 5 %   Eosinophils Absolute 0.0 0.0 - 0.7 K/uL   Basophils Relative 0 0 - 1 %   Basophils Absolute 0.0 0.0 - 0.1 K/uL  Glucose, capillary     Status: Abnormal   Collection Time: 10/10/14  7:34 PM  Result Value Ref Range   Glucose-Capillary 205 (H) 70 - 99 mg/dL   Comment 1 Notify RN   Glucose, capillary      Status: Abnormal   Collection Time: 10/10/14 11:51 PM  Result Value Ref Range   Glucose-Capillary 176 (H) 70 - 99 mg/dL   Comment 1 Notify RN   Glucose, capillary     Status: Abnormal   Collection Time: 10/11/14  4:04 AM  Result Value Ref Range   Glucose-Capillary 184 (H) 70 - 99 mg/dL   Comment 1 Notify RN   Magnesium     Status: None   Collection Time: 10/11/14  4:40 AM  Result Value Ref Range   Magnesium 2.5 1.5 - 2.5 mg/dL  Renal function panel     Status: Abnormal   Collection Time: 10/11/14  4:40 AM  Result Value Ref Range   Sodium 139 137 - 147 mEq/L   Potassium 4.4 3.7 - 5.3 mEq/L   Chloride 98 96 - 112 mEq/L   CO2 28 19 - 32 mEq/L   Glucose, Bld 200 (H) 70 - 99 mg/dL   BUN 62 (H) 6 - 23 mg/dL   Creatinine, Ser 2.81 (H) 0.50 - 1.10 mg/dL   Calcium 7.8 (L) 8.4 - 10.5 mg/dL   Phosphorus 4.9 (H) 2.3 - 4.6 mg/dL   Albumin 1.4 (L) 3.5 - 5.2 g/dL   GFR calc non Af Amer 17 (L) >90 mL/min   GFR calc Af Amer 20 (L) >90 mL/min    Comment: (NOTE) The eGFR has been calculated using the CKD EPI equation. This calculation has not been validated in all clinical situations. eGFR's persistently <90 mL/min signify possible Chronic Kidney Disease.    Anion gap 13 5 - 15  CBC     Status: Abnormal   Collection Time: 10/11/14  4:40 AM  Result Value Ref Range   WBC 7.6 4.0 - 10.5 K/uL   RBC 2.50 (L) 3.87 - 5.11 MIL/uL   Hemoglobin 6.6 (LL)  12.0 - 15.0 g/dL    Comment: REPEATED TO VERIFY CRITICAL RESULT CALLED TO, READ BACK BY AND VERIFIED WITH: C.HAYES RN [phone] 10/11/14 E.GADDY    HCT 20.6 (L) 36.0 - 46.0 %   MCV 82.4 78.0 - 100.0 fL   MCH 26.4 26.0 - 34.0 pg   MCHC 32.0 30.0 - 36.0 g/dL   RDW 16.5 (H) 11.5 - 15.5 %   Platelets 80 (L) 150 - 400 K/uL  APTT     Status: Abnormal   Collection Time: 10/11/14  4:40 AM  Result Value Ref Range   aPTT 74 (H) 24 - 37 seconds    Comment:        IF BASELINE aPTT IS ELEVATED, SUGGEST PATIENT RISK ASSESSMENT BE USED TO DETERMINE  APPROPRIATE ANTICOAGULANT THERAPY.   Basic metabolic panel     Status: Abnormal   Collection Time: 10/11/14  4:40 AM  Result Value Ref Range   Sodium 139 137 - 147 mEq/L   Potassium 4.4 3.7 - 5.3 mEq/L   Chloride 99 96 - 112 mEq/L   CO2 27 19 - 32 mEq/L   Glucose, Bld 204 (H) 70 - 99 mg/dL   BUN 63 (H) 6 - 23 mg/dL   Creatinine, Ser 2.78 (H) 0.50 - 1.10 mg/dL   Calcium 7.8 (L) 8.4 - 10.5 mg/dL   GFR calc non Af Amer 17 (L) >90 mL/min   GFR calc Af Amer 20 (L) >90 mL/min    Comment: (NOTE) The eGFR has been calculated using the CKD EPI equation. This calculation has not been validated in all clinical situations. eGFR's persistently <90 mL/min signify possible Chronic Kidney Disease.    Anion gap 13 5 - 15  Prepare RBC     Status: None   Collection Time: 10/11/14  6:18 AM  Result Value Ref Range   Order Confirmation ORDER PROCESSED BY BLOOD BANK   Hepatitis B surface antigen     Status: None   Collection Time: 10/11/14  7:24 AM  Result Value Ref Range   Hepatitis B Surface Ag NEGATIVE NEGATIVE    Comment: Performed at [facility]  Hepatitis B surface antibody     Status: None   Collection Time: 10/11/14  7:24 AM  Result Value Ref Range   Hep B S Ab INDETER NEGATIVE    Comment: (NOTE) Result repeated and verified. Unable to determine if anti-HBs is present at levels consistent with immunity.  Patient's immune status should be further assessed by considering other clinical information. Performed at [facility]   Hepatitis B core antibody, total     Status: None   Collection Time: 10/11/14  7:24 AM  Result Value Ref Range   Hep B Core Total Ab NON REACTIVE NON REACTIVE    Comment: Performed at [facility]  Glucose, capillary     Status: Abnormal   Collection Time: 10/11/14  8:10 AM  Result Value Ref Range   Glucose-Capillary 164 (H) 70 - 99 mg/dL  Glucose, capillary     Status: Abnormal   Collection Time: 10/11/14 12:03 PM  Result Value Ref  Range   Glucose-Capillary 121 (H) 70 - 99 mg/dL  Glucose, capillary     Status: Abnormal   Collection Time: 10/11/14  7:12 PM  Result Value Ref Range   Glucose-Capillary 144 (H) 70 - 99 mg/dL  Glucose, capillary     Status: Abnormal   Collection Time: 10/12/14  3:57 AM  Result Value Ref Range  Glucose-Capillary 107 (H) 70 - 99 mg/dL  Magnesium     Status: None   Collection Time: 10/12/14  4:58 AM  Result Value Ref Range   Magnesium 2.1 1.5 - 2.5 mg/dL  CBC     Status: Abnormal   Collection Time: 10/12/14  4:58 AM  Result Value Ref Range   WBC 5.7 4.0 - 10.5 K/uL   RBC 2.84 (L) 3.87 - 5.11 MIL/uL   Hemoglobin 7.9 (L) 12.0 - 15.0 g/dL   HCT 24.1 (L) 36.0 - 46.0 %   MCV 84.9 78.0 - 100.0 fL   MCH 27.8 26.0 - 34.0 pg   MCHC 32.8 30.0 - 36.0 g/dL   RDW 16.0 (H) 11.5 - 15.5 %   Platelets 92 (L) 150 - 400 K/uL    Comment: CONSISTENT WITH PREVIOUS RESULT  APTT     Status: Abnormal   Collection Time: 10/12/14  4:58 AM  Result Value Ref Range   aPTT 67 (H) 24 - 37 seconds    Comment:        IF BASELINE aPTT IS ELEVATED, SUGGEST PATIENT RISK ASSESSMENT BE USED TO DETERMINE APPROPRIATE ANTICOAGULANT THERAPY.   Renal function panel     Status: Abnormal   Collection Time: 10/12/14  4:58 AM  Result Value Ref Range   Sodium 140 137 - 147 mEq/L   Potassium 3.9 3.7 - 5.3 mEq/L   Chloride 101 96 - 112 mEq/L   CO2 24 19 - 32 mEq/L   Glucose, Bld 114 (H) 70 - 99 mg/dL   BUN 37 (H) 6 - 23 mg/dL    Comment: DELTA CHECK NOTED   Creatinine, Ser 2.25 (H) 0.50 - 1.10 mg/dL   Calcium 7.7 (L) 8.4 - 10.5 mg/dL   Phosphorus 4.1 2.3 - 4.6 mg/dL   Albumin 1.4 (L) 3.5 - 5.2 g/dL   GFR calc non Af Amer 22 (L) >90 mL/min   GFR calc Af Amer 26 (L) >90 mL/min    Comment: (NOTE) The eGFR has been calculated using the CKD EPI equation. This calculation has not been validated in all clinical situations. eGFR's persistently <90 mL/min signify possible Chronic Kidney Disease.    Anion gap 15 5 - 15   Glucose, capillary     Status: None   Collection Time: 10/12/14 12:04 PM  Result Value Ref Range   Glucose-Capillary 85 70 - 99 mg/dL   Dg Chest Portable 1 View  10/11/2014   CLINICAL DATA:  Pulmonary edema  EXAM: PORTABLE CHEST - 1 VIEW  COMPARISON:  10/09/2014  FINDINGS: There is borderline cardiomegaly which is stable from previous. Aortic and hilar contours stable.  Endotracheal tube tip just below the clavicular heads. Gastric suction tube enters the stomach. There is a right IJ catheter, tip at the SVC.  Bilateral nodular airspace disease is stable from yesterday. Dense consolidation in the left lower lobe is also stable. Small bilateral pleural effusions without evidence of increase. No air leak.  IMPRESSION: 1. Stable positioning of tubes and central line. 2. Stable appearance of bilateral pneumonia.   Electronically Signed   By: Jorje Guild M.D.   On: 10/11/2014 06:14    10/12/2014, 1:35 PM   Assessment: 63 y.o. female PMH of diabetes and medical non-compliance who was last seen normal on 09/30/2014 at 8 PM. On admission her husband stated that she was unable to arouse patient. They both had recently recently has a possible viral URI and the patient had been coughing more.  She  was became unresponsive and was intubated by EDP and was extubated on 10/11/2014. She has had slow improvement during her stay in the ICU. Initial report on head ct scan wo on 11/13 was normal. 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 Neurology was consulted to evaluate patient for possibility of a stroke. Patient has significant arterial and venous thrombosis in both lower and upper extremities. 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 Stroke is also likely given findings on ct scan and vascular thrombotic phenomenon. Hemoglobin a1c is 12%  Stroke Risk Factors -  diabetes mellitus, hypercoagulable state and smoking  Plan: 1. Fasting lipid panel 2. MRI, MRA and Carotid dopplers will be deferred at this time as patient is not a candidate for CEA given co-morbidities and currently on anticoagulants and imaging will not change management  3. Prophylactic therapy- PCCM plan for Heparin with transition to coumadin. Given size of stroke, see no contraindication for anticoagulants.  4. Risk factor modification 5. Telemetry monitoring 6. Frequent neuro checks 7. PT/OT SLP  Case to discussed with Dr Armida Sans.  Jessee Avers, MD PGY-3 Internal Medicine Teaching Service Pager: [phone] 10/12/2014, 2:28 PM    Patient seen and examined together with PGY-3 Internal Medicine resident and I concur with the assessment and plan.  Dorian Pod, MD

## 2014-10-12 NOTE — Progress Notes (Signed)
[facility] ROUNDING NOTE   Subjective:   Interval History: no change Objective:  Vital signs in last 24 hours:  Temp:  [97.1 F (36.2 C)-99.2 F (37.3 C)] 98.4 F (36.9 C) (11/24 0803) Pulse Rate:  [76-95] 90 (11/24 0900) Resp:  [16-30] 27 (11/24 0900) BP: (91-138)/(43-71) 103/48 mmHg (11/24 0900) SpO2:  [90 %-100 %] 90 % (11/24 0900) Arterial Line BP: (111-131)/(48-60) 121/49 mmHg (11/24 0900) FiO2 (%):  [40 %] 40 % (11/23 1100) Weight:  [73.6 kg (162 lb 4.1 oz)-77 kg (169 lb 12.1 oz)] 73.6 kg (162 lb 4.1 oz) (11/24 0400)  Weight change: 1.4 kg (3 lb 1.4 oz) Filed Weights   10/11/14 0730 10/11/14 1130 10/12/14 0400  Weight: 79.4 kg (175 lb 0.7 oz) 77 kg (169 lb 12.1 oz) 73.6 kg (162 lb 4.1 oz)    Intake/Output: I/O last 3 completed shifts: In: 2101.7 [I.V.:616.7; Blood:365; NG/GT:1020; IV Piggyback:100] Out: 3432 [Urine:432; Other:3000]   Intake/Output this shift:  Total I/O In: 25.4 [I.V.:25.4] Out: 12 [Urine:12]  CVS- RRR RS- CTA ABD- BS present soft non-distended EXT- no edema   Basic Metabolic Panel:  Recent Labs Lab 10/08/14 0414  10/09/14 0410 10/09/14 1710 10/10/14 0410 10/10/14 1535 10/11/14 0440 10/12/14 0458  NA 139  < > 138 136* 135* 137 139  139 140  K 3.8  < > 4.8 4.9 5.2 5.0 4.4  4.4 3.9  CL 89*  < > 97 98 97 98 98  99 101  CO2 39*  < > 31 28 27 27 28  27 24   GLUCOSE 154*  < > 176* 154* 193* 165* 200*  204* 114*  BUN 18  < > 22 25* 38* 48* 62*  63* 37*  CREATININE 1.21*  < > 1.14* 1.19* 1.69* 2.30* 2.81*  2.78* 2.25*  CALCIUM 10.9*  < > 8.4 8.1* 7.9* 8.0* 7.8*  7.8* 7.7*  MG 2.0  --  2.2  --  2.3  --  2.5 2.1  PHOS 2.7  < > 2.0* 2.5 2.7  --  4.9* 4.1  < > = values in this interval not displayed.  Liver Function Tests:  Recent Labs Lab 10/09/14 0410 10/09/14 1710 10/10/14 0410 10/11/14 0440 10/12/14 0458  ALBUMIN 1.4* 1.4* 1.4* 1.4* 1.4*   No results for input(s): LIPASE, AMYLASE in the last 168  hours. No results for input(s): AMMONIA in the last 168 hours.  CBC:  Recent Labs Lab 10/09/14 0410 10/10/14 0410 10/10/14 1825 10/11/14 0440 10/12/14 0458  WBC 7.6 8.0 7.9 7.6 5.7  NEUTROABS  --   --  7.0  --   --   HGB 8.1* 6.5* 7.2* 6.6* 7.9*  HCT 25.9* 20.5* 22.4* 20.6* 24.1*  MCV 81.4 84.4 83.0 82.4 84.9  PLT 41* 68* 72* 80* 92*    Cardiac Enzymes: No results for input(s): CKTOTAL, CKMB, CKMBINDEX, TROPONINI in the last 168 hours.  BNP: Invalid input(s): POCBNP  CBG:  Recent Labs Lab 10/11/14 0404 10/11/14 0810 10/11/14 1203 10/11/14 1912 10/12/14 0357  GLUCAP 184* 164* 121* 144* 107*    Microbiology: Results for orders placed or performed during the hospital encounter of 10/01/14  Blood culture (routine x 2)     Status: None   Collection Time: 10/01/14  9:50 AM  Result Value Ref Range Status   Specimen Description BLOOD FOREARM LEFT  Final   Special Requests BOTTLES DRAWN AEROBIC AND ANAEROBIC 5CC  Final   Culture  Setup Time   Final  10/01/2014 14:50 Performed at [facility]    Culture   Final    STAPHYLOCOCCUS AUREUS Note: RIFAMPIN AND GENTAMICIN SHOULD NOT BE USED AS SINGLE DRUGS FOR TREATMENT OF STAPH INFECTIONS. Note: Gram Stain Report Called to,Read Back By and Verified With: RITA BROWN 10/02/14 @ 11:34AM BY RUSCOE A. Performed at [facility]    Report Status 10/04/2014 FINAL  Final   Organism ID, Bacteria STAPHYLOCOCCUS AUREUS  Final      Susceptibility   Staphylococcus aureus - MIC*    CLINDAMYCIN <=0.25 SENSITIVE Sensitive     ERYTHROMYCIN <=0.25 SENSITIVE Sensitive     GENTAMICIN <=0.5 SENSITIVE Sensitive     LEVOFLOXACIN 0.25 SENSITIVE Sensitive     OXACILLIN 0.5 SENSITIVE Sensitive     PENICILLIN SENSITIVE      RIFAMPIN <=0.5 SENSITIVE Sensitive     TRIMETH/SULFA <=10 SENSITIVE Sensitive     VANCOMYCIN 1 SENSITIVE Sensitive     TETRACYCLINE <=1 SENSITIVE Sensitive     MOXIFLOXACIN <=0.25 SENSITIVE Sensitive      * STAPHYLOCOCCUS AUREUS  Blood culture (routine x 2)     Status: None   Collection Time: 10/01/14 10:00 AM  Result Value Ref Range Status   Specimen Description BLOOD HAND LEFT  Final   Special Requests BOTTLES DRAWN AEROBIC AND ANAEROBIC 5ML  Final   Culture  Setup Time   Final    10/01/2014 14:52 Performed at [facility]    Culture   Final    NO GROWTH 5 DAYS Performed at [facility]    Report Status 10/07/2014 FINAL  Final  Urine culture     Status: None   Collection Time: 10/01/14 10:13 AM  Result Value Ref Range Status   Specimen Description URINE, CATHETERIZED  Final   Special Requests NONE  Final   Culture  Setup Time   Final    10/01/2014 16:36 Performed at [facility]    Colony Count NO GROWTH Performed at [facility]   Final   Culture NO GROWTH Performed at [facility]   Final   Report Status 10/02/2014 FINAL  Final  Culture, respiratory (NON-Expectorated)     Status: None   Collection Time: 10/01/14 12:50 PM  Result Value Ref Range Status   Specimen Description ENDOTRACHEAL  Final   Special Requests NONE  Final   Gram Stain   Final    MODERATE WBC PRESENT,BOTH PMN AND MONONUCLEAR RARE SQUAMOUS EPITHELIAL CELLS PRESENT ABUNDANT GRAM POSITIVE COCCI IN PAIRS IN CLUSTERS Performed at [facility]    Culture   Final    ABUNDANT STAPHYLOCOCCUS AUREUS Note: RIFAMPIN AND GENTAMICIN SHOULD NOT BE USED AS SINGLE DRUGS FOR TREATMENT OF STAPH INFECTIONS. ABUNDANT STREPTOCOCCUS,BETA HEMOLYTIC NOT GROUP A Note: TESTING AGAINST S. AGALACTIAE NOT ROUTINELY PERFORMED DUE TO PREDICTABILITY OF AMP/PEN/VAN SUSCEPTIBILITY. Performed at [facility]    Report Status 10/04/2014 FINAL  Final   Organism ID, Bacteria STAPHYLOCOCCUS AUREUS  Final      Susceptibility   Staphylococcus aureus - MIC*    CLINDAMYCIN <=0.25 SENSITIVE Sensitive     ERYTHROMYCIN <=0.25 SENSITIVE Sensitive     GENTAMICIN <=0.5 SENSITIVE  Sensitive     LEVOFLOXACIN 0.25 SENSITIVE Sensitive     OXACILLIN 0.5 SENSITIVE Sensitive     PENICILLIN 0.06 SENSITIVE Sensitive     RIFAMPIN <=0.5 SENSITIVE Sensitive     TRIMETH/SULFA <=10 SENSITIVE Sensitive     VANCOMYCIN <=0.5 SENSITIVE Sensitive     TETRACYCLINE <=  1 SENSITIVE Sensitive     MOXIFLOXACIN <=0.25 SENSITIVE Sensitive     * ABUNDANT STAPHYLOCOCCUS AUREUS  MRSA PCR Screening     Status: None   Collection Time: 10/01/14  4:04 PM  Result Value Ref Range Status   MRSA by PCR NEGATIVE NEGATIVE Final    Comment:        The GeneXpert MRSA Assay (FDA approved for NASAL specimens only), is one component of a comprehensive MRSA colonization surveillance program. It is not intended to diagnose MRSA infection nor to guide or monitor treatment for MRSA infections.   Urine culture     Status: None   Collection Time: 10/02/14  3:15 AM  Result Value Ref Range Status   Specimen Description URINE, RANDOM  Final   Special Requests NONE  Final   Culture  Setup Time   Final    10/02/2014 10:55 Performed at [facility]    Colony Count NO GROWTH Performed at [facility]   Final   Culture NO GROWTH Performed at [facility]   Final   Report Status 10/03/2014 FINAL  Final  Culture, blood (routine x 2)     Status: None   Collection Time: 10/05/14  8:10 PM  Result Value Ref Range Status   Specimen Description BLOOD LEFT FOOT  Final   Special Requests BOTTLES DRAWN AEROBIC ONLY 1CC  Final   Culture  Setup Time   Final    10/06/2014 00:46 Performed at [facility]    Culture   Final    NO GROWTH 5 DAYS Performed at [facility]    Report Status 10/12/2014 FINAL  Final  Culture, blood (routine x 2)     Status: None   Collection Time: 10/05/14  8:16 PM  Result Value Ref Range Status   Specimen Description BLOOD LEFT ARM  Final   Special Requests BOTTLES DRAWN AEROBIC ONLY 1CC  Final   Culture  Setup Time   Final    10/06/2014  00:46 Performed at [facility]    Culture   Final    NO GROWTH 5 DAYS Performed at [facility]    Report Status 10/12/2014 FINAL  Final  Culture, blood (single)     Status: None (Preliminary result)   Collection Time: 10/07/14  2:58 PM  Result Value Ref Range Status   Specimen Description BLOOD CENTRAL LINE  Final   Special Requests BOTTLES DRAWN AEROBIC AND ANAEROBIC  Final   Culture  Setup Time   Final    10/07/2014 22:24 Performed at [facility]    Culture   Final           BLOOD CULTURE RECEIVED NO GROWTH TO DATE CULTURE WILL BE HELD FOR 5 DAYS BEFORE ISSUING A FINAL NEGATIVE REPORT Performed at [facility]    Report Status PENDING  Incomplete  Culture, respiratory (NON-Expectorated)     Status: None (Preliminary result)   Collection Time: 10/10/14  5:58 PM  Result Value Ref Range Status   Specimen Description ENDOTRACHEAL  Final   Special Requests NONE  Final   Gram Stain PENDING  Incomplete   Culture   Final    MODERATE STAPHYLOCOCCUS AUREUS Note: RIFAMPIN AND GENTAMICIN SHOULD NOT BE USED AS SINGLE DRUGS FOR TREATMENT OF STAPH INFECTIONS. Performed at [facility]    Report Status PENDING  Incomplete    Coagulation Studies: No results for input(s): LABPROT, INR in the last 72 hours.  Urinalysis:  Recent Labs  10/10/14 1753  COLORURINE YELLOW  LABSPEC 1.016  PHURINE 6.0  GLUCOSEU NEGATIVE  HGBUR LARGE*  BILIRUBINUR SMALL*  KETONESUR 15*  PROTEINUR 100*  UROBILINOGEN 0.2  NITRITE NEGATIVE  LEUKOCYTESUR MODERATE*      Imaging: Dg Chest Portable 1 View  10/11/2014   CLINICAL DATA:  Pulmonary edema  EXAM: PORTABLE CHEST - 1 VIEW  COMPARISON:  10/09/2014  FINDINGS: There is borderline cardiomegaly which is stable from previous. Aortic and hilar contours stable.  Endotracheal tube tip just below the clavicular heads. Gastric suction tube enters the stomach. There is a right IJ catheter, tip at the SVC.   Bilateral nodular airspace disease is stable from yesterday. Dense consolidation in the left lower lobe is also stable. Small bilateral pleural effusions without evidence of increase. No air leak.  IMPRESSION: 1. Stable positioning of tubes and central line. 2. Stable appearance of bilateral pneumonia.   Electronically Signed   By: Tiburcio PeaJonathan  Watts M.D.   On: 10/11/2014 06:14     Medications:   . sodium chloride 10 mL/hr at 10/10/14 0345  . argatroban 45 mcg/min (10/11/14 2000)  . feeding supplement (VITAL HIGH PROTEIN) 1,000 mL (10/10/14 2130)  . fentaNYL infusion INTRAVENOUS 75 mcg/hr (10/11/14 0600)   . antiseptic oral rinse  7 mL Mouth Rinse QID  .  ceFAZolin (ANCEF) IV  1 g Intravenous Q24H  . chlorhexidine  15 mL Mouth Rinse BID  . docusate  50 mg Per Tube Daily  . insulin aspart  0-15 Units Subcutaneous 6 times per day  . insulin glargine  8 Units Subcutaneous Daily  . ipratropium-albuterol  3 mL Nebulization Q6H  . pantoprazole sodium  40 mg Per Tube Daily  . polyethylene glycol  17 g Per Tube Daily   Place/Maintain arterial line **AND** sodium chloride, Place/Maintain arterial line **AND** sodium chloride, Place/Maintain arterial line **AND** sodium chloride, acetaminophen (TYLENOL) oral liquid 160 mg/5 mL, albuterol, dextrose, fentaNYL, midazolam  Assessment/ Plan:  1. Acute renal injury: Appears to be from ATN of sepsis +/- PEA arrest. oliguria , stopped CRRT  Electrolytes within acceptable limits, volume status significantly better. . , she does not a candidate for outpatient hemodialysis 2. Hypophosphatemia: improved 3. Altered mental status: wakes tracking not following much commands 4. Aspiration pneumonia/sepsis/MSSA bacteremia: on Ancef and being followed by ID- no clear identifiable source/localization of infection except for pneumonia. 5. Right hand ischemia:improving right hand perfusion-vascular surgery following. Off pressors. 6. Anemia: Hb 7.9  We shall assess  dialysis requirements on a day by day basis   LOS: 11 Darren Nodal W @TODAY @9 :50 AM

## 2014-10-12 NOTE — Progress Notes (Addendum)
PULMONARY / CRITICAL CARE MEDICINE   Name: [name] MRN: [id number] DOB: 1951-05-24    ADMISSION DATE:  10/01/2014 CONSULTATION DATE:  10/01/2014  REFERRING MD :  EDP  CHIEF COMPLAINT:  DKA, respiratory failure and AMS  INITIAL PRESENTATION: 63 year old female with PMH of diabetes and medical non-compliance who was last seen normal the day PTP at 8 PM.  Husband woke up this AM and was unable to arouse patient.  Evidently both have been "fighting a cold" but patient was coughing more the night before.  EMS was called and patient was brought to the ED.  In the ED patient became unresponsive and was intubated by EDP.  PCCM was called to admit.  STUDIES:  11/13 CXR with aspiration PNA and abdominal x-ray ? dentures in her lower abdomen. 11/13 CT abd/pelvis, no foreign body noted ? 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 Probable small subcortical left occipital lobe infarct. 11/17 eeg>>>gen slowing c/w met enceph 11/18 CT Chest/Abd/Pelvis>> no abscess, small effusions, not loculated, infiltrates bilateral 11/19 TEE>no veg, clot svc, ra and on HD cath, 25% 11/20 upper ext dopplers>>>RUE DVT  11/20 lowers>>>superficial thrombus bilat saphenous   SIGNIFICANT EVENTS: 11/13 Presenting with AMS, DKA and VDRF 11/16- remeain in shock, renal failure 11/16- cvvhd 11/18- SVC clot, all heparin stopped, argatroban started, HD cath changed 11/23 Transfused one RBCs for Hgb 6.6 11/23 Extubated  SUBJECTIVE:  Has tolerated extubation well. No distress. No new complaints. RASS 0. + F/C  VITAL SIGNS: Temp:  [97.8 F (36.6 C)-99.2 F (37.3 C)] 97.8 F (36.6 C) (11/24 1206) Pulse Rate:  [75-95] 81 (11/24 1200) Resp:  [17-30] 23 (11/24 1200) BP:  (94-108)/(38-56) 94/39 mmHg (11/24 1200) SpO2:  [90 %-99 %] 97 % (11/24 1200) Arterial Line BP: (110-131)/(48-60) 123/55 mmHg (11/24 1100) Weight:  [73.6 kg (162 lb 4.1 oz)] 73.6 kg (162 lb 4.1 oz) (11/24 0400) HEMODYNAMICS: CVP:  [2 mmHg-3 mmHg] 2 mmHg VENTILATOR SETTINGS:   INTAKE / OUTPUT:  Intake/Output Summary (Last 24 hours) at 10/12/14 1441 Last data filed at 10/12/14 1200  Gross per 24 hour  Intake  326.7 ml  Output    199 ml  Net  127.7 ml    PHYSICAL EXAMINATION: General: NAD Neuro: RASS 0, + F/C, no focal deficits, diffusely weak HEENT:WNL Cardiovascular:  Reg, no M. Lungs: No wheezes Abdomen:  Soft, NT, ND and +BS. Ext: no edema, warm  LABS: I have reviewed all of today's lab results. Relevant abnormalities are discussed in the A/P section  CXR: NNF  ASSESSMENT / PLAN:  PULMONARY OETT 11/13>> 11/24 A: Acute resp failure, resolving PNA, MSSA Pulm edema P:   Cont to monitor in ICU post extubation Supp O2 to maintain SpO2 > 92%   CARDIOVASCULAR CVL L [place] TLC 11/13>>> L Fem HD cath 11/19>>> Septic shock, resolved Severe cardiomyopathy, LVEF 25% Extensive SVC/RA clot P:  Monitor of pressors MAP goal > 60 mmHg  RENAL A:   AKI, oliguric P:   Monitor BMET intermittently Monitor I/Os Correct electrolytes as indicated HD per renal  GASTROINTESTINAL A:   Severe protein-calorie malnutrition Dysphagia P: -  SUP: N/I post extubation NPO post extubation pending SLP eval Advance nutrition  HEMATOLOGIC A:   Anemia Thrombocytopenia, 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 RUE DVT,  SVC/RA clot P:  Cont argatroban Consider warfarin when able to take POs Monitor CBC intermittently Transfuse per usual ICU guidelines  INFECTIOUS A:   Severe sepsis  MSSA bacteremia P:   Micro results reviewed in detail Cont cefazolin through 12/31 to complete 6 wks ID service has signed off  ENDOCRINE A:   DKA, resolved IDDM, controlled P:   Insulin regimen  adjusted  NEUROLOGIC A:   Acute encephalopathy, much improved New CVA by CT head 11/17 Severe deconditioning P:   RASS goal: 0 Minimize sedatives/opioids  Neuro consult 11/24 PT eval Will ultimately need LTACH - should be ready 11/25   Merton Border, MD ; [facility] service Mobile [phone].  After 5:30 PM or weekends, call [phone]   ADD: Failed bedside swallow eval. SLP recommends FEES.    30 mins CCM time  Merton Border, MD ; [facility] [phone].  After 5:30 PM or weekends, call [phone]

## 2014-10-12 NOTE — Progress Notes (Signed)
ANTICOAGULATION & ANTIBIOTIC CONSULT NOTE - Follow Up Consult  Pharmacy Consult for Argatroban + Cefazolin Indication: SVC thrombus, r/o HIT and MSSA bacteremia  Allergies  Allergen Reactions  . Penicillins     Rash     Patient Measurements: Height: 5\' 4"  (162.6 cm) Weight: 162 lb 4.1 oz (73.6 kg) IBW/kg (Calculated) : 54.7  Vital Signs: Temp: 97.8 F (36.6 C) (11/24 1206) Temp Source: Oral (11/24 1206) BP: 105/52 mmHg (11/24 1400) Pulse Rate: 78 (11/24 1400)  Labs:  Recent Labs  10/10/14 0410 10/10/14 1535 10/10/14 1825 10/11/14 0440 10/12/14 0458  HGB 6.5*  --  7.2* 6.6* 7.9*  HCT 20.5*  --  22.4* 20.6* 24.1*  PLT 68*  --  72* 80* 92*  APTT 75*  --   --  74* 67*  CREATININE 1.69* 2.30*  --  2.81*  2.78* 2.25*    Estimated Creatinine Clearance: 25.8 mL/min (by C-G formula based on Cr of 2.25).   Medications:  Argatroban at 45 mcg/min  Assessment: 63 yo female with clot in superior vena cava extending to the R atrium and multiple other clots in arm on doppler. Pt is in acute renal failure on CRRT, switched to HD (11/21) without set schedule yet. Pt received heparin at prophylaxis doses and heparin with CRRT from 11/13 through 11/18, when all heparin products were stopped due to declining platelets. Plts 252 > 56. 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   HIT panel negative. aPTT level this morning is therapeutic (aPTT  74 >> 67). Hgb/Hct low/stable - 7.9/24.1 (s/p 2 PRBC), may be related to blood loss with stopping CRRT, plts increasing, 80 >> 92. No bleeding noted per nurse.  The patient also continues on Cefazolin for MSSA bacteremia/PNA - TEE negative for endocarditis, ID on board. The patient stopped CRRT on 11/21 evening and will transition to IHD starting on 11/23. Since the patient's HD schedule is unknown (per renal, not an outpatient candidate) - will continue Cefazolin  to q24h dosing for now. Pt did not receive HD today (11/24). Patient continues to have no urine output.   Goal of Therapy:  aPTT 50-90 seconds Monitor platelets by anticoagulation protocol: Yes Proper antibiotics for infection/cultures adjusted for renal/hepatic function    Plan:  1. Continue argatroban at 45 mcg/min 2. Monitor aPTT daily now that patient is at goal level 3. Continue Cefazolin 1g IV every 24 hours-adjust once HD schedule set 4. Will continue to monitor for any signs/symptoms of bleeding. Will continue to follow renal function, culture results, LOT, and antibiotic de-escalation plans   5. Follow up on plans to bridge to other anticoagulation (UFH/warfarin)  Gillermo Murdochuhani Alam, PharmD Candidate  10/12/14   I have reviewed and agree with above assessment and plan.   Link SnufferJessica Adriane Gabbert, PharmD, BCPS Clinical Pharmacist [phone] 10/12/2014, 4:03 PM

## 2014-10-12 NOTE — Discharge Summary (Signed)
Physician Discharge Summary  Patient ID: [name] MRN: [id number] DOB/AGE: July 07, 1951 63 y.o.  Admit date: 10/01/2014 Discharge date: 10/13/2014    Discharge Diagnoses:  Acute Respiratory Failure MSSA PNA Pulmonary Edema  Septic Shock in setting of MSSA PNA Severe Cardiomyopathy  SVC / RA Clot Oliguric Acute Kidney Injury Severe Protein-Calorie Malnutrition Dysphagia Anemia  Thrombocytopenia  HIT Ab Negative  RUE DVT DKA IDDM  Acute Encephalopathy  CVA  Severe Deconditioning  Schizophrenia                                                                         DISCHARGE PLAN BY DIAGNOSIS      Acute resp failure, resolving PNA, MSSA Pulm edema  Discharge Plan:  Supplemental O2 to maintain SpO2 > 92% Pulmonary hygiene:  IS, mobilization  Septic shock, resolved Severe cardiomyopathy, LVEF 25% Extensive SVC/RA clot  Discharge Plan: MAP goal > 60 mmHg Hold home lisinopril   AKI, oliguric - thought to be ATN of sepsis.   Overactive Bladder  Discharge Plan:  Monitor BMET intermittently Monitor I/Os Correct electrolytes as indicated HD per renal, intermittent.  Recommend Renal Consult once transferred to [facility]  Hold home ditropan   Severe protein-calorie malnutrition Dysphagia  Discharge Plan: SLP evaluation, will need FEES Nutrition to follow for dietary needs NPO until seen by SLP  Anemia Thrombocytopenia, 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 RUE VTE SVC/RA Thrombus P:  Heparin gtt per pharmacy, 1100 units per hour currently Begin warfarin once able to take PO's / cleared by SLP Monitor CBC intermittently Transfuse for Hgb <7%, active bleeding or MI <8% Monitor R arm closely for changes.  VVS signed off 11/23.  Exam improving.   Severe sepsis  MSSA bacteremia  Discharge Plan:  Cont cefazolin through 12/31 to complete 6 wks Monitor for new fever / increase in WBC  DKA, resolved IDDM, controlled  Discharge Plan:  Insulin SSI, Lantus 5  untis QD Hold Janumet, metformin  Acute encephalopathy, much improved New CVA by CT head 11/17 Severe deconditioning Schizophrenia   Discharge Plan: Minimize sedatives/opioids  Continue aggressive PT efforts Hold cogentin, abilify Resume wellbutrin Lipitor with new CVA and LDL >70                 DISCHARGE SUMMARY   [name] is a 63 y.o. y/o female with a PMH of DM, Schizophrenia who presented to the [facility] ER on 10/01/14 with a blood sugar of 551, altered mental status and recent history of "fighting a cold" with a cough.  After arrival to ER the patient was intubated due to decreased LOC and respiratory insufficiency.     Initial CXR was concerning for aspiration.  She was pan cultured and treated with Cefepime.  Further work up consistent with DKA and she was started on an insulin gtt.  Acute encephalopathy was thought to be related to acidosis / DKA & hypotension.  The patient was volume resuscitated, treated with stress dose steroids, vasopressors and admitted to ICU.    Troponin, EKG & ECHO assessed in the setting of hypotension.  ECHO was notable for EF 35-40% and small pericardial effusion.  Her RUE hand was noted to have mottling with decreased pulses prompting a Vascular Surgery  consult and arterial doppler of the right arm which showed a RUE occlusion.  Heparin gtt was initiated per Vascular Surgery.  There were also concerns on admit that she had potentially swallowed her dentures and this was ruled out on CT of ABD/Pelvis.    She made slow progression in ICU.  Due to ongoing encephalopathy, the patient was evaluated with a CT of the Head 11/17 which showed a small cortical infarct in the parafalcine right occipital lobe, new from 10/01/2014. Probable small subcortical left occipital lobe infarct.  EEG also reflected generalized slowing consistent with metabolic encephalopathy.    The patient had progressive renal failure thought related to ATN of sepsis and was  started on CVVHD on 11/16 per Nephrology.  In the setting of MSSA bacteremia, a TEE was assessed and was negative for vegetation but showed a clot in the SVC, RA and on the HD catheter.  The patient had been on heparin in the setting of the RUE arterial occulusion.  With the fall of her platelets & findings on TEE, she was transitioned to Argatroban and HIT panel was assessed which was negative.  Course was also further complicated by anemia requiring PRBC transfusion (11/23) for a Hbg of 6.6.  She made slow improvement and was able to be extubated on 11/24 without further respiratory difficulties.  Patient was medically cleared for discharge to [facility] in [place] for further rehab efforts.               STUDIES:  11/13  CXR with aspiration PNA and abdominal x-ray ? dentures in her lower abdomen. 11/13  CT abd/pelvis, no foreign body noted  Overlap of bowel walls, neg for obstruction  11/14  ECHO >> EF 35-40%, small pericardial effusion  11/15  Arterial doppler RUE occlusion, per vasc 11/16  Renal US, ascites, no hydro 11/17  CT Head >> Small cortical infarct in the parafalcine right occipital lobe, new from 10/01/2014. Probable small subcortical left occipital lobe infarct. 11/17  EEG >>gen slowing c/w met enceph 11/18  CT Chest/Abd/Pelvis>> no abscess, small effusions, not loculated, infiltrates bilateral 11/19  TEE>no veg, clot svc, ra and on HD cath, 25% 11/20  Upper ext dopplers>>>RUE DVT  11/20  LE Doppler>>>superficial thrombus bilat saphenous   SIGNIFICANT EVENTS: 11/13  Presenting with AMS, DKA and VDRF 11/16  Remeain in shock, renal failure 11/16  CVVHD 11/18  SVC clot, all heparin stopped, argatroban started, HD cath changed 11/23  Transfused one RBCs for Hgb 6.6 11/23  Extubated   MICRO DATA  11/13 UC >> neg 11/13 BCx2 >> MSSA 11/13 Sputum >> MSSA  11/17 BCx2 >> neg 11/19 BCx1 >> 11/22 Sputum >> moderate staph >> MSSA   ANTIBIOTICS SEE ABOVE  PLAN  CONSULTS Nephrology Vascular Surgery   TUBES / LINES OETT 11/13>> 11/24 CVL L [place] TLC 11/13>>> out L Fem HD cath 11/19>>>    Discharge Exam: General: NAD Neuro: Awake, alert, + F/C, no focal deficits, diffusely weak HEENT:WNL Cardiovascular: Reg, no M. Lungs: No wheezes Abdomen: Soft, NT, ND and +BS. Ext: no edema, warm  Filed Vitals:   10/13/14 1000 10/13/14 1100 10/13/14 1200 10/13/14 1210  BP: 106/53 110/53 115/57   Pulse: 76 75 69   Temp:    98.6 F (37 C)  TempSrc:    Oral  Resp: _0 Height:      Weight:      SpO2: 95% 93% 95%      Discharge  Labs  BMET  Recent Labs Lab 10/08/14 0414  10/09/14 0410 10/09/14 1710 10/10/14 0410 10/10/14 1535 10/11/14 0440 10/12/14 0458 10/13/14 0450  NA 139  < > 138 136* 135* 137 139  139 140 141  K 3.8  < > 4.8 4.9 5.2 5.0 4.4  4.4 3.9 4.1  CL 89*  < > 97 98 97 98 98  99 101 100  CO2 39*  < > _0 GLUCOSE 154*  < > 176* 154* 193* 165* 200*  204* 114* 124*  BUN 18  < > 22 25* 38* 48* 62*  63* 37* 58*  CREATININE 1.21*  < > 1.14* 1.19* 1.69* 2.30* 2.81*  2.78* 2.25* 3.34*  CALCIUM 10.9*  < > 8.4 8.1* 7.9* 8.0* 7.8*  7.8* 7.7* 7.8*  MG 2.0  --  2.2  --  2.3  --  2.5 2.1  --   PHOS 2.7  < > 2.0* 2.5 2.7  --  4.9* 4.1 6.3*  < > = values in this interval not displayed.  CBC  Recent Labs Lab 10/11/14 0440 10/12/14 0458 10/13/14 0450  HGB 6.6* 7.9* 7.8*  HCT 20.6* 24.1* 24.1*  WBC 7.6 5.7 5.6  PLT 80* 92* 113*       Medication List    STOP taking these medications        ARIPiprazole 20 MG tablet  Commonly known as:  ABILIFY     benztropine 1 MG tablet  Commonly known as:  COGENTIN     glucose monitoring kit monitoring kit     guaiFENesin 200 MG tablet     insulin NPH-regular Human (70-30) 100 UNIT/ML injection  Commonly known as:  NOVOLIN 70/30     JANUMET 50-1000 MG per tablet  Generic drug:  sitaGLIPtin-metformin     lisinopril 5 MG tablet   Commonly known as:  PRINIVIL,ZESTRIL     metFORMIN 500 MG tablet  Commonly known as:  GLUCOPHAGE     MULTIVITAMIN PO     oxybutynin 10 MG 24 hr tablet  Commonly known as:  DITROPAN-XL      TAKE these medications        acetaminophen 160 MG/5ML solution  Commonly known as:  TYLENOL  Place 20.3 mLs (650 mg total) into feeding tube every 4 (four) hours as needed for fever.     albuterol (2.5 MG/3ML) 0.083% nebulizer solution  Commonly known as:  PROVENTIL  Take 3 mLs (2.5 mg total) by nebulization every 3 (three) hours as needed for wheezing or shortness of breath.     antiseptic oral rinse 0.05 % Liqd solution  Commonly known as:  CPC / CETYLPYRIDINIUM CHLORIDE 0.05%  7 mLs by Mouth Rinse route 2 times daily at 12 noon and 4 pm.     atorvastatin 10 MG tablet  Commonly known as:  LIPITOR  Take 1 tablet (10 mg total) by mouth daily.     buPROPion 150 MG 24 hr tablet  Commonly known as:  WELLBUTRIN XL  Take 150 mg by mouth daily.     ceFAZolin 1-5 GM-%  Commonly known as:  ANCEF  Inject 50 mLs (1 g total) into the vein daily.     chlorhexidine 0.12 % solution  Commonly known as:  PERIDEX  15 mLs by Mouth Rinse route 2 (two) times daily.     heparin 100-0.45 UNIT/ML-% infusion  Inject 1,100 Units/hr into the vein continuous.  insulin glargine 100 UNIT/ML injection  Commonly known as:  LANTUS  Inject 0.05 mLs (5 Units total) into the skin daily.     ipratropium-albuterol 0.5-2.5 (3) MG/3ML Soln  Commonly known as:  DUONEB  Take 3 mLs by nebulization every 6 (six) hours.     multivitamin with minerals Tabs tablet  Take 1 tablet by mouth daily.     sodium chloride 0.9 % infusion  10 ml/hr        Follow-up Information    Please follow up.   Why:  Follow up per instructions of [facility] at time of discharge.       Disposition:  [facility]  Discharged Condition: [name] has met maximum benefit of inpatient care and is medically stable  and cleared for discharge.  Patient is pending follow up as above.      Time spent on disposition:  Greater than 35 minutes.   Signed: Noe Gens, NP-C [facility] Pgr: [phone] Office: [place] discussed in detail Agree with above  Merton Border, MD ; [facility] service Mobile [phone].  After 5:30 PM or weekends, call [phone]

## 2014-10-13 ENCOUNTER — Inpatient Hospital Stay
Admission: AD | Admit: 2014-10-13 | Discharge: 2014-11-04 | Disposition: A | Discharge status: Skilled Nursing Facility | Payer: Self-pay | Source: Ambulatory Visit | Attending: Internal Medicine | Admitting: Internal Medicine

## 2014-10-13 DIAGNOSIS — Z4659 Encounter for fitting and adjustment of other gastrointestinal appliance and device: Secondary | ICD-10-CM

## 2014-10-13 DIAGNOSIS — J9690 Respiratory failure, unspecified, unspecified whether with hypoxia or hypercapnia: Secondary | ICD-10-CM

## 2014-10-13 DIAGNOSIS — J189 Pneumonia, unspecified organism: Secondary | ICD-10-CM

## 2014-10-13 LAB — RENAL FUNCTION PANEL
ALBUMIN: 1.6 g/dL — AB (ref 3.5–5.2)
ANION GAP: 18 — AB (ref 5–15)
BUN: 58 mg/dL — AB (ref 6–23)
CALCIUM: 7.8 mg/dL — AB (ref 8.4–10.5)
CO2: 23 mEq/L (ref 19–32)
Chloride: 100 mEq/L (ref 96–112)
Creatinine, Ser: 3.34 mg/dL — ABNORMAL HIGH (ref 0.50–1.10)
GFR calc Af Amer: 16 mL/min — ABNORMAL LOW (ref >90)
GFR calc non Af Amer: 14 mL/min — ABNORMAL LOW (ref >90)
GLUCOSE: 124 mg/dL — AB (ref 70–99)
PHOSPHORUS: 6.3 mg/dL — AB (ref 2.3–4.6)
POTASSIUM: 4.1 meq/L (ref 3.7–5.3)
Sodium: 141 mEq/L (ref 137–147)

## 2014-10-13 LAB — CULTURE, RESPIRATORY

## 2014-10-13 LAB — GLUCOSE, CAPILLARY
GLUCOSE-CAPILLARY: 121 mg/dL — AB (ref 70–99)
GLUCOSE-CAPILLARY: 131 mg/dL — AB (ref 70–99)
Glucose-Capillary: 120 mg/dL — ABNORMAL HIGH (ref 70–99)
Glucose-Capillary: 142 mg/dL — ABNORMAL HIGH (ref 70–99)

## 2014-10-13 LAB — CULTURE, RESPIRATORY W GRAM STAIN

## 2014-10-13 LAB — CBC
HCT: 24.1 % — ABNORMAL LOW (ref 36.0–46.0)
HEMOGLOBIN: 7.8 g/dL — AB (ref 12.0–15.0)
MCH: 27.6 pg (ref 26.0–34.0)
MCHC: 32.4 g/dL (ref 30.0–36.0)
MCV: 85.2 fL (ref 78.0–100.0)
Platelets: 113 10*3/uL — ABNORMAL LOW (ref 150–400)
RBC: 2.83 MIL/uL — AB (ref 3.87–5.11)
RDW: 15.7 % — ABNORMAL HIGH (ref 11.5–15.5)
WBC: 5.6 10*3/uL (ref 4.0–10.5)

## 2014-10-13 LAB — CULTURE, BLOOD (SINGLE): Culture: NO GROWTH

## 2014-10-13 LAB — APTT
APTT: 60 s — AB (ref 24–37)
aPTT: 59 seconds — ABNORMAL HIGH (ref 24–37)

## 2014-10-13 LAB — HEPARIN LEVEL (UNFRACTIONATED)

## 2014-10-13 MED ORDER — INSULIN GLARGINE 100 UNIT/ML ~~LOC~~ SOLN: 5.0000 [IU] | Freq: Every day | SUBCUTANEOUS | Status: DC

## 2014-10-13 MED ORDER — CEFAZOLIN SODIUM 1-5 GM-% IV SOLN: 1.0000 g | INTRAVENOUS | Status: AC

## 2014-10-13 MED ORDER — ALBUTEROL SULFATE (2.5 MG/3ML) 0.083% IN NEBU: 2.5000 mg | INHALATION_SOLUTION | RESPIRATORY_TRACT | Status: DC | PRN

## 2014-10-13 MED ORDER — SODIUM CHLORIDE 0.9 % IV SOLN: INTRAVENOUS | Status: DC

## 2014-10-13 MED ORDER — ACETAMINOPHEN 160 MG/5ML PO SOLN: 650.0000 mg | ORAL | Status: DC | PRN

## 2014-10-13 MED ORDER — IPRATROPIUM-ALBUTEROL 0.5-2.5 (3) MG/3ML IN SOLN: 3.0000 mL | Freq: Four times a day (QID) | RESPIRATORY_TRACT | Status: DC

## 2014-10-13 MED ORDER — CHLORHEXIDINE GLUCONATE 0.12 % MT SOLN: 15.0000 mL | Freq: Two times a day (BID) | OROMUCOSAL | Status: DC

## 2014-10-13 MED ORDER — HEPARIN (PORCINE) IN NACL 100-0.45 UNIT/ML-% IJ SOLN: 1100.0000 [IU]/h | INTRAMUSCULAR | Status: DC

## 2014-10-13 MED ORDER — HEPARIN (PORCINE) IN NACL 100-0.45 UNIT/ML-% IJ SOLN
1100.0000 [IU]/h | INTRAMUSCULAR | Status: DC
  Administered 2014-10-13: 1100 [IU]/h via INTRAVENOUS
  Filled 2014-10-13 (×2): qty 250

## 2014-10-13 MED ORDER — ATORVASTATIN CALCIUM 10 MG PO TABS: 10.0000 mg | ORAL_TABLET | Freq: Every day | ORAL | Status: DC

## 2014-10-13 MED ORDER — CETYLPYRIDINIUM CHLORIDE 0.05 % MT LIQD: 7.0000 mL | Freq: Two times a day (BID) | OROMUCOSAL | Status: DC

## 2014-10-13 MED FILL — Medication: Qty: 1 | Status: AC

## 2014-10-13 NOTE — Progress Notes (Signed)
Patient discharged to [facility] via bed accompanied by two staff members.

## 2014-10-13 NOTE — Progress Notes (Signed)
ANTICOAGULATION CONSULT NOTE - Initial Consult  Pharmacy Consult for transition from argatroban to heparin   Indication: SVC thrombosis  Allergies  Allergen Reactions  . Penicillins     Rash     Patient Measurements: Height: 5\' 4"  (162.6 cm) Weight: 162 lb 4.1 oz (73.6 kg) IBW/kg (Calculated) : 54.7 Heparin Dosing Weight: 70 kg  Vital Signs: Temp: 98.4 F (36.9 C) (11/25 0810) Temp Source: Oral (11/25 0810) BP: 109/54 mmHg (11/25 0900) Pulse Rate: 77 (11/25 0900)  Labs:  Recent Labs  10/11/14 0440 10/12/14 0458 10/13/14 0450  HGB 6.6* 7.9* 7.8*  HCT 20.6* 24.1* 24.1*  PLT 80* 92* 113*  APTT 74* 67* 60*  CREATININE 2.81*  2.78* 2.25* 3.34*    Estimated Creatinine Clearance: 17.4 mL/min (by C-G formula based on Cr of 3.34).   Medical History: Past Medical History  Diagnosis Date  . Mixed stress and urge urinary incontinence   . Schizophrenia   . Allergy     Assessment: 63 yo female with clot in superior vena cava extending to the R atrium and multiple other clots in arm on doppler. Pt is in acute renal failure on CRRT, switched to HD (11/21) without set schedule yet. Per neuro, pt had a small stroke on head CT from 11/17 and recommends anticoagulation with heparin bridged to warfarin.    Pt transitioned from IV UFH to argatroban for r/o HIT. Complete HIT panel negative, now transitioning back to IV UFH with plans to bridge to warfarin in near future. Pt being discharged to [facility], agatroban stopped at 0930, checked aPTT (1030: 59) and heparin level undetectable.  H/H low stable, pltc improving 92 >> 113, feel comfortable starting heparin 2 hours post argatroban d/c.   The patient also continues on Cefazolin for MSSA bacteremia/PNA - TEE negative for endocarditis, ID on board. The patient stopped CRRT on 11/21 evening and will transition to IHD starting on 11/23. Since the patient's HD schedule is unknown (per renal, not an outpatient candidate) - will continue  Cefazolin to q24h dosing for now. Pt did not receive HD today (11/24). UOP 0.2 mL/kg/hr  Goal of Therapy:  Heparin level 0.3-0.7 units/ml Monitor platelets by anticoagulation protocol: Yes   Plan:  1. Start Heparin 1100 units/hr gtt (goal level: 0.3-0.7), no bolus as pt was anticoagulated on argatroban  (1200) 2. Heparin level at 2200 3. Monitor heparin level, CBC daily 4. Continue cefazolin 1g Q 24 hrs  5. F/u any further plans/schedule of HD  Gillermo Murdochuhani Alam, PharmD Candidate  10/13/2014,9:31 AM   I have reviewed and agree with above assessment and plan.   Link SnufferJessica Elmira Olkowski, PharmD, BCPS Clinical Pharmacist [phone] 10/13/2014, 11:58 AM

## 2014-10-13 NOTE — Progress Notes (Signed)
[facility] ROUNDING NOTE   Subjective:   Interval History: awake and alert    Objective:  Vital signs in last 24 hours:  Temp:  [97.8 F (36.6 C)-99 F (37.2 C)] 98.4 F (36.9 C) (11/25 0810) Pulse Rate:  [75-90] 77 (11/25 0900) Resp:  [18-27] 26 (11/25 0900) BP: (94-116)/(38-56) 109/54 mmHg (11/25 0900) SpO2:  [90 %-98 %] 93 % (11/25 0900) Arterial Line BP: (110-123)/(48-55) 123/55 mmHg (11/24 1100) FiO2 (%):  [28 %] 28 % (11/24 1440)  Weight change:  Filed Weights   10/11/14 0730 10/11/14 1130 10/12/14 0400  Weight: 79.4 kg (175 lb 0.7 oz) 77 kg (169 lb 12.1 oz) 73.6 kg (162 lb 4.1 oz)    Intake/Output: I/O last 3 completed shifts: In: 467.2 [I.V.:247.2; Other:120; IV Piggyback:100] Out: 419 [Urine:419]   Intake/Output this shift:  Total I/O In: 22.7 [I.V.:12.7; Other:10] Out: 85 [Urine:85]  CVS- RRR RS- CTA ABD- BS present soft non-distended   Foley catheter EXT- trace edema   Basic Metabolic Panel:  Recent Labs Lab 10/08/14 0414  10/09/14 0410 10/09/14 1710 10/10/14 0410 10/10/14 1535 10/11/14 0440 10/12/14 0458 10/13/14 0450  NA 139  < > 138 136* 135* 137 139  139 140 141  K 3.8  < > 4.8 4.9 5.2 5.0 4.4  4.4 3.9 4.1  CL 89*  < > 97 98 97 98 98  99 101 100  CO2 39*  < > 31 28 27 27 28  27 24 23   GLUCOSE 154*  < > 176* 154* 193* 165* 200*  204* 114* 124*  BUN 18  < > 22 25* 38* 48* 62*  63* 37* 58*  CREATININE 1.21*  < > 1.14* 1.19* 1.69* 2.30* 2.81*  2.78* 2.25* 3.34*  CALCIUM 10.9*  < > 8.4 8.1* 7.9* 8.0* 7.8*  7.8* 7.7* 7.8*  MG 2.0  --  2.2  --  2.3  --  2.5 2.1  --   PHOS 2.7  < > 2.0* 2.5 2.7  --  4.9* 4.1 6.3*  < > = values in this interval not displayed.  Liver Function Tests:  Recent Labs Lab 10/09/14 1710 10/10/14 0410 10/11/14 0440 10/12/14 0458 10/13/14 0450  ALBUMIN 1.4* 1.4* 1.4* 1.4* 1.6*   No results for input(s): LIPASE, AMYLASE in the last 168 hours. No results for input(s): AMMONIA in the last  168 hours.  CBC:  Recent Labs Lab 10/10/14 0410 10/10/14 1825 10/11/14 0440 10/12/14 0458 10/13/14 0450  WBC 8.0 7.9 7.6 5.7 5.6  NEUTROABS  --  7.0  --   --   --   HGB 6.5* 7.2* 6.6* 7.9* 7.8*  HCT 20.5* 22.4* 20.6* 24.1* 24.1*  MCV 84.4 83.0 82.4 84.9 85.2  PLT 68* 72* 80* 92* 113*    Cardiac Enzymes: No results for input(s): CKTOTAL, CKMB, CKMBINDEX, TROPONINI in the last 168 hours.  BNP: Invalid input(s): POCBNP  CBG:  Recent Labs Lab 10/12/14 1544 10/12/14 1920 10/12/14 2344 10/13/14 0408 10/13/14 0808  GLUCAP 122* 123* 142* 121* 131*    Microbiology: Results for orders placed or performed during the hospital encounter of 10/01/14  Blood culture (routine x 2)     Status: None   Collection Time: 10/01/14  9:50 AM  Result Value Ref Range Status   Specimen Description BLOOD FOREARM LEFT  Final   Special Requests BOTTLES DRAWN AEROBIC AND ANAEROBIC 5CC  Final   Culture  Setup Time   Final    10/01/2014 14:50 Performed  at [facility]    Culture   Final    STAPHYLOCOCCUS AUREUS Note: RIFAMPIN AND GENTAMICIN SHOULD NOT BE USED AS SINGLE DRUGS FOR TREATMENT OF STAPH INFECTIONS. Note: Gram Stain Report Called to,Read Back By and Verified With: RITA BROWN 10/02/14 @ 11:34AM BY RUSCOE A. Performed at [facility]    Report Status 10/04/2014 FINAL  Final   Organism ID, Bacteria STAPHYLOCOCCUS AUREUS  Final      Susceptibility   Staphylococcus aureus - MIC*    CLINDAMYCIN <=0.25 SENSITIVE Sensitive     ERYTHROMYCIN <=0.25 SENSITIVE Sensitive     GENTAMICIN <=0.5 SENSITIVE Sensitive     LEVOFLOXACIN 0.25 SENSITIVE Sensitive     OXACILLIN 0.5 SENSITIVE Sensitive     PENICILLIN SENSITIVE      RIFAMPIN <=0.5 SENSITIVE Sensitive     TRIMETH/SULFA <=10 SENSITIVE Sensitive     VANCOMYCIN 1 SENSITIVE Sensitive     TETRACYCLINE <=1 SENSITIVE Sensitive     MOXIFLOXACIN <=0.25 SENSITIVE Sensitive     * STAPHYLOCOCCUS AUREUS  Blood culture (routine x  2)     Status: None   Collection Time: 10/01/14 10:00 AM  Result Value Ref Range Status   Specimen Description BLOOD HAND LEFT  Final   Special Requests BOTTLES DRAWN AEROBIC AND ANAEROBIC  Final   Culture  Setup Time   Final    10/01/2014 14:52 Performed at [facility]    Culture   Final    NO GROWTH 5 DAYS Performed at [facility]    Report Status 10/07/2014 FINAL  Final  Urine culture     Status: None   Collection Time: 10/01/14 10:13 AM  Result Value Ref Range Status   Specimen Description URINE, CATHETERIZED  Final   Special Requests NONE  Final   Culture  Setup Time   Final    10/01/2014 16:36 Performed at [facility]    Colony Count NO GROWTH Performed at [facility]   Final   Culture NO GROWTH Performed at [facility]   Final   Report Status 10/02/2014 FINAL  Final  Culture, respiratory (NON-Expectorated)     Status: None   Collection Time: 10/01/14 12:50 PM  Result Value Ref Range Status   Specimen Description ENDOTRACHEAL  Final   Special Requests NONE  Final   Gram Stain   Final    MODERATE WBC PRESENT,BOTH PMN AND MONONUCLEAR RARE SQUAMOUS EPITHELIAL CELLS PRESENT ABUNDANT GRAM POSITIVE COCCI IN PAIRS IN CLUSTERS Performed at [facility]    Culture   Final    ABUNDANT STAPHYLOCOCCUS AUREUS Note: RIFAMPIN AND GENTAMICIN SHOULD NOT BE USED AS SINGLE DRUGS FOR TREATMENT OF STAPH INFECTIONS. ABUNDANT STREPTOCOCCUS,BETA HEMOLYTIC NOT GROUP A Note: TESTING AGAINST S. AGALACTIAE NOT ROUTINELY PERFORMED DUE TO PREDICTABILITY OF AMP/PEN/VAN SUSCEPTIBILITY. Performed at [facility]    Report Status 10/04/2014 FINAL  Final   Organism ID, Bacteria STAPHYLOCOCCUS AUREUS  Final      Susceptibility   Staphylococcus aureus - MIC*    CLINDAMYCIN <=0.25 SENSITIVE Sensitive     ERYTHROMYCIN <=0.25 SENSITIVE Sensitive     GENTAMICIN <=0.5 SENSITIVE Sensitive     LEVOFLOXACIN 0.25 SENSITIVE Sensitive      OXACILLIN 0.5 SENSITIVE Sensitive     PENICILLIN 0.06 SENSITIVE Sensitive     RIFAMPIN <=0.5 SENSITIVE Sensitive     TRIMETH/SULFA <=10 SENSITIVE Sensitive     VANCOMYCIN <=0.5 SENSITIVE Sensitive     TETRACYCLINE <=1 SENSITIVE Sensitive  MOXIFLOXACIN <=0.25 SENSITIVE Sensitive     * ABUNDANT STAPHYLOCOCCUS AUREUS  MRSA PCR Screening     Status: None   Collection Time: 10/01/14  4:04 PM  Result Value Ref Range Status   MRSA by PCR NEGATIVE NEGATIVE Final    Comment:        The GeneXpert MRSA Assay (FDA approved for NASAL specimens only), is one component of a comprehensive MRSA colonization surveillance program. It is not intended to diagnose MRSA infection nor to guide or monitor treatment for MRSA infections.   Urine culture     Status: None   Collection Time: 10/02/14  3:15 AM  Result Value Ref Range Status   Specimen Description URINE, RANDOM  Final   Special Requests NONE  Final   Culture  Setup Time   Final    10/02/2014 10:55 [facility]formed [facility]    Colony Count NO GROWTH [facility] at [facility]   Final   Culture NO GROWTH [facility] at [facility]   Final   Report Status 10/03/2014 FINAL  Final  Culture, blood (routine x 2)     Status: None   Collection Time: 10/05/14  8:10 PM  Result Value Ref Range Status   Specimen Description BLOOD LEFT FOOT  Final   Special Requests BOTTLES DRAWN AEROBIC ONLY 1CC  Final   Culture  Setup Time   Final    10/06/2014 00:46 Performed at [facility]    Culture   Final    NO GROWTH 5 DAYS Performed at [facility]    Report Status 10/12/2014 FINAL  Final  Culture, blood (routine x 2)     Status: None   Collection Time: 10/05/14  8:16 PM  Result Value Ref Range Status   Specimen Description BLOOD LEFT ARM  Final   Special Requests BOTTLES DRAWN AEROBIC ONLY 1CC  Final   Culture  Setup Time   Final    10/06/2014 00:46 Performed at [facility]    Culture    Final    NO GROWTH 5 DAYS Performed at [facility]    Report Status 10/12/2014 FINAL  Final  Culture, blood (single)     Status: None   Collection Time: 10/07/14  2:58 PM  Result Value Ref Range Status   Specimen Description BLOOD CENTRAL LINE  Final   Special Requests BOTTLES DRAWN AEROBIC AND ANAEROBIC 10MLS  Final   Culture  Setup Time   Final    10/07/2014 22:24 Performed at [facility]    Culture   Final    NO GROWTH 5 DAYS Performed at [facility]    Report Status 10/13/2014 FINAL  Final  Culture, respiratory (NON-Expectorated)     Status: None   Collection Time: 10/10/14  5:58 PM  Result Value Ref Range Status   Specimen Description ENDOTRACHEAL  Final   Special Requests NONE  Final   Gram Stain   Final    RARE WBC PRESENT, PREDOMINANTLY PMN RARE SQUAMOUS EPITHELIAL CELLS PRESENT RARE GRAM POSITIVE COCCI IN CLUSTERS [facility] at [facility]    Culture   Final    MODERATE STAPHYLOCOCCUS AUREUS Note: RIFAMPIN AND GENTAMICIN SHOULD NOT BE USED AS SINGLE DRUGS FOR TREATMENT OF STAPH INFECTIONS. Performed at [facility]    Report Status 10/13/2014 FINAL  Final   Organism ID, Bacteria STAPHYLOCOCCUS AUREUS  Final      Susceptibility   Staphylococcus aureus - MIC*  CLINDAMYCIN <=0.25 SENSITIVE Sensitive     ERYTHROMYCIN <=0.25 SENSITIVE Sensitive     GENTAMICIN <=0.5 SENSITIVE Sensitive     LEVOFLOXACIN 0.25 SENSITIVE Sensitive     OXACILLIN <=0.25 SENSITIVE Sensitive     PENICILLIN 0.06 SENSITIVE Sensitive     RIFAMPIN <=0.5 SENSITIVE Sensitive     TRIMETH/SULFA <=10 SENSITIVE Sensitive     VANCOMYCIN 1 SENSITIVE Sensitive     TETRACYCLINE <=1 SENSITIVE Sensitive     MOXIFLOXACIN <=0.25 SENSITIVE Sensitive     * MODERATE STAPHYLOCOCCUS AUREUS    Coagulation Studies: No results for input(s): LABPROT, INR in the last 72 hours.  Urinalysis:  Recent Labs  10/10/14 1753  COLORURINE YELLOW  LABSPEC 1.016   PHURINE 6.0  GLUCOSEU NEGATIVE  HGBUR LARGE*  BILIRUBINUR SMALL*  KETONESUR 15*  PROTEINUR 100*  UROBILINOGEN 0.2  NITRITE NEGATIVE  LEUKOCYTESUR MODERATE*      Imaging: No results found.   Medications:   . sodium chloride 10 mL/hr at 10/10/14 0345  . sodium chloride 10 mL/hr at 10/13/14 0900  . argatroban 45 mcg/min (10/13/14 0448)   . antiseptic oral rinse  7 mL Mouth Rinse q12n4p  .  ceFAZolin (ANCEF) IV  1 g Intravenous Q24H  . chlorhexidine  15 mL Mouth Rinse BID  . insulin glargine  5 Units Subcutaneous Daily  . ipratropium-albuterol  3 mL Nebulization Q6H   acetaminophen (TYLENOL) oral liquid 160 mg/5 mL, albuterol, dextrose  Assessment/ Plan:  1. Acute renal injury: This appears to be from ATN of sepsis +/- PEA arrest. Oliguric although urine output a little better  Will continue to observe 2. Metabolic acidosis: stable 3. Altered mental status: improved 4. Aspiration pneumonia/sepsis/MSSA bacteremia:followed by ID 5. Right hand ischemia:improving right hand perfusion-vascular surgery following.   Patient has no indication for dialysis at this time would recommend renal consultation on [facility]   LOS: 12 Evelio Rueda W @TODAY @9 :21 AM

## 2014-10-13 NOTE — Progress Notes (Signed)
Report called to [facility].  All questions and concerns addressed.  Nurse advised that Renal will need to follow.

## 2014-10-13 NOTE — Progress Notes (Signed)
PT Cancellation Note  Patient Details Name: Narda RutherfordDeborah A Rolfe MRN: [id number] DOB: July 07, 1951   Cancelled Treatment:    Reason Eval/Treat Not Completed: Patient at procedure or test/unavailable Patient having FEES study performed with SLP currently. RN states pt is to transfer to [facility] care following FEES study. Please contact PT if evaluation to be performed prior to departure.   [address]Log[address] Secor BakerBarbour, South CarolinaPT [phone]   [place]erton MountBarbour, Adasyn Mcadams S 10/13/2014, 1:43 PM

## 2014-10-13 NOTE — Procedures (Signed)
Objective Swallowing Evaluation: Fiberoptic Endoscopic Evaluation of Swallowing  Patient Details  Name: [name] MRN: [id number] Date of Birth: 07-Jun-1951  Today's Date: 10/13/2014 Time: 1326-1350 SLP Time Calculation (min) (ACUTE ONLY): 24 min  Past Medical History:  Past Medical History  Diagnosis Date  . Mixed stress and urge urinary incontinence   . Schizophrenia   . Allergy    Past Surgical History: History reviewed. No pertinent past surgical history. HPI:  63 year old female with PMH of diabetes, schizophrenia and medical non-compliance admitted 11/13 with decreased arousal and found to have DKA, respiratory failure, severe metabolic acidosis. CXR 11/13 revealed pna.Intubated 11/13-11/23.  CXR Stable appearance of bilateral pneumonia.     Assessment / Plan / Recommendation Clinical Impression  Dysphagia Diagnosis: Severe pharyngeal phase dysphagia  Clinical impression: Pt has a severe pharyngeal dysphagia characterized by a delayed swallow initiation that leads to aspiration before the swallow with liquids tested. Purees are penetrated before the swallow and are ultimately aspirated due to inability to cough and clear the vestibule despite Max multimodal cueing from SLP. SLP attempts at intervention including postural and compensatory strategies were not effective at protecting the pt's airway, due in large part to decreased ability to follow commands at this time. Generalized weakness leads to diffuse, although mild, pharyngeal residuals. Recommend that pt remain NPO at this time with continued SLP intervention to maximize swallow function and safety. [facility]    Treatment Recommendation  F/U FEES in ___ days (Comment) (tbd)    Diet Recommendation NPO   Medication Administration: Via alternative means    Other  Recommendations Oral Care Recommendations: Oral care Q4 per protocol   Follow Up Recommendations  LTACH     Frequency and Duration min 2x/week  2 weeks   Pertinent Vitals/Pain n/a    SLP Swallow Goals     General HPI: 63 year old female with PMH of diabetes, schizophrenia and medical non-compliance admitted 11/13 with decreased arousal and found to have DKA, respiratory failure, severe metabolic acidosis. CXR 11/13 revealed pna.Intubated 11/13-11/23.  CXR Stable appearance of bilateral pneumonia. Type of Study: Fiberoptic Endoscopic Evaluation of Swallowing Reason for Referral: Objectively evaluate swallowing function Previous Swallow Assessment: BSE 11/24 recommending NPO until FEES Diet Prior to this Study: NPO Temperature Spikes Noted: No Respiratory Status: Nasal cannula History of Recent Intubation: Yes Length of Intubations (days): 10 days Date extubated: 10/11/14 Behavior/Cognition: Alert;Confused;Distractible;Requires cueing;Decreased sustained attention;Cooperative Oral Cavity - Dentition: Edentulous Self-Feeding Abilities: Needs assist Patient Positioning: Upright in bed Baseline Vocal Quality: Hoarse;Low vocal intensity (vocalized x1, upon command, almost inaudible) Volitional Cough: Cognitively unable to elicit Volitional Swallow: Able to elicit Anatomy: Within functional limits Pharyngeal Secretions: Standing secretions in (comment) (pyriform sinuses, dried secretions on PPW)    Reason for Referral Objectively evaluate swallowing function   Oral Phase Oral Preparation/Oral Phase Oral Phase: Impaired Oral - Honey Oral - Honey Teaspoon: Reduced posterior propulsion;Delayed oral transit Oral - Nectar Oral - Nectar Teaspoon: Reduced posterior propulsion;Delayed oral transit Oral - Solids Oral - Puree: Reduced posterior propulsion;Delayed oral transit   Pharyngeal Phase Pharyngeal Phase Pharyngeal Phase: Impaired Pharyngeal - Honey Pharyngeal - Honey Teaspoon: Delayed swallow initiation;Reduced pharyngeal peristalsis;Reduced airway/laryngeal  closure;Penetration/Aspiration before swallow;Pharyngeal residue - pyriform sinuses;Pharyngeal residue - posterior pharnyx Penetration/Aspiration details (honey teaspoon): Material enters airway, passes BELOW cords and not ejected out despite cough attempt by patient Pharyngeal - Nectar Pharyngeal - Nectar Teaspoon: Delayed swallow initiation;Reduced pharyngeal peristalsis;Reduced airway/laryngeal closure;Penetration/Aspiration before  swallow;Pharyngeal residue - pyriform sinuses;Pharyngeal residue - posterior pharnyx Penetration/Aspiration details (nectar teaspoon): Material enters airway, passes BELOW cords and not ejected out despite cough attempt by patient Pharyngeal - Solids Pharyngeal - Puree: Delayed swallow initiation;Reduced pharyngeal peristalsis;Reduced airway/laryngeal closure;Penetration/Aspiration before swallow;Pharyngeal residue - pyriform sinuses;Pharyngeal residue - posterior pharnyx Penetration/Aspiration details (puree): Material enters airway, passes BELOW cords without attempt by patient to eject out (silent aspiration)  Cervical Esophageal Phase    GO    Cervical Esophageal Phase Cervical Esophageal Phase: [facility]        Maxcine HamLaura Paiewonsky, M.A. CCC-SLP [phone]  Maxcine Hamaiewonsky, Solveig Fangman 10/13/2014, 2:05 PM

## 2014-10-14 ENCOUNTER — Other Ambulatory Visit (HOSPITAL_COMMUNITY): Payer: Self-pay

## 2014-10-14 LAB — CBC
HEMATOCRIT: 26.7 % — AB (ref 36.0–46.0)
Hemoglobin: 8.7 g/dL — ABNORMAL LOW (ref 12.0–15.0)
MCH: 27.4 pg (ref 26.0–34.0)
MCHC: 32.6 g/dL (ref 30.0–36.0)
MCV: 84 fL (ref 78.0–100.0)
PLATELETS: 130 10*3/uL — AB (ref 150–400)
RBC: 3.18 MIL/uL — ABNORMAL LOW (ref 3.87–5.11)
RDW: 16 % — AB (ref 11.5–15.5)
WBC: 5.1 10*3/uL (ref 4.0–10.5)

## 2014-10-14 LAB — COMPREHENSIVE METABOLIC PANEL
ALBUMIN: 1.9 g/dL — AB (ref 3.5–5.2)
ALT: <5 U/L (ref 0–35)
AST: 18 U/L (ref 0–37)
Alkaline Phosphatase: 86 U/L (ref 39–117)
Anion gap: 21 — ABNORMAL HIGH (ref 5–15)
BILIRUBIN TOTAL: 0.3 mg/dL (ref 0.3–1.2)
BUN: 72 mg/dL — ABNORMAL HIGH (ref 6–23)
CALCIUM: 8 mg/dL — AB (ref 8.4–10.5)
CO2: 20 meq/L (ref 19–32)
CREATININE: 3.59 mg/dL — AB (ref 0.50–1.10)
Chloride: 99 mEq/L (ref 96–112)
GFR calc Af Amer: 15 mL/min — ABNORMAL LOW (ref >90)
GFR, EST NON AFRICAN AMERICAN: 13 mL/min — AB (ref >90)
Glucose, Bld: 128 mg/dL — ABNORMAL HIGH (ref 70–99)
Potassium: 4.1 mEq/L (ref 3.7–5.3)
Sodium: 140 mEq/L (ref 137–147)
Total Protein: 6.4 g/dL (ref 6.0–8.3)

## 2014-10-14 LAB — URINALYSIS, ROUTINE W REFLEX MICROSCOPIC
GLUCOSE, UA: NEGATIVE mg/dL
KETONES UR: 15 mg/dL — AB
Nitrite: NEGATIVE
Protein, ur: 30 mg/dL — AB
Specific Gravity, Urine: 1.014 (ref 1.005–1.030)
Urobilinogen, UA: 0.2 mg/dL (ref 0.0–1.0)
pH: 5.5 (ref 5.0–8.0)

## 2014-10-14 LAB — URINE MICROSCOPIC-ADD ON

## 2014-10-14 LAB — PROTIME-INR
INR: 2.14 — ABNORMAL HIGH (ref 0.00–1.49)
INR: 1.99 — ABNORMAL HIGH (ref 0.00–1.49)
Prothrombin Time: 24.1 seconds — ABNORMAL HIGH (ref 11.6–15.2)
Prothrombin Time: 22.8 seconds — ABNORMAL HIGH (ref 11.6–15.2)

## 2014-10-14 LAB — APTT: APTT: 44 s — AB (ref 24–37)

## 2014-10-14 LAB — TSH: TSH: 3.97 u[IU]/mL (ref 0.350–4.500)

## 2014-10-14 LAB — HEPARIN LEVEL (UNFRACTIONATED): Heparin Unfractionated: <0.1 IU/mL — ABNORMAL LOW (ref 0.30–0.70)

## 2014-10-15 ENCOUNTER — Other Ambulatory Visit (HOSPITAL_COMMUNITY): Payer: Self-pay

## 2014-10-15 LAB — BASIC METABOLIC PANEL
Anion gap: 17 — ABNORMAL HIGH (ref 5–15)
BUN: 77 mg/dL — ABNORMAL HIGH (ref 6–23)
CO2: 24 meq/L (ref 19–32)
Calcium: 7.7 mg/dL — ABNORMAL LOW (ref 8.4–10.5)
Chloride: 100 mEq/L (ref 96–112)
Creatinine, Ser: 3.14 mg/dL — ABNORMAL HIGH (ref 0.50–1.10)
GFR calc Af Amer: 17 mL/min — ABNORMAL LOW (ref >90)
GFR calc non Af Amer: 15 mL/min — ABNORMAL LOW (ref >90)
GLUCOSE: 160 mg/dL — AB (ref 70–99)
Potassium: 4.1 mEq/L (ref 3.7–5.3)
SODIUM: 141 meq/L (ref 137–147)

## 2014-10-15 LAB — CBC
HCT: 25.3 % — ABNORMAL LOW (ref 36.0–46.0)
Hemoglobin: 8.1 g/dL — ABNORMAL LOW (ref 12.0–15.0)
MCH: 27.1 pg (ref 26.0–34.0)
MCHC: 32 g/dL (ref 30.0–36.0)
MCV: 84.6 fL (ref 78.0–100.0)
Platelets: 142 10*3/uL — ABNORMAL LOW (ref 150–400)
RBC: 2.99 MIL/uL — AB (ref 3.87–5.11)
RDW: 16 % — ABNORMAL HIGH (ref 11.5–15.5)
WBC: 4.8 10*3/uL (ref 4.0–10.5)

## 2014-10-15 LAB — HEPARIN LEVEL (UNFRACTIONATED)
HEPARIN UNFRACTIONATED: 0.23 [IU]/mL — AB (ref 0.30–0.70)
Heparin Unfractionated: 0.64 IU/mL (ref 0.30–0.70)
Heparin Unfractionated: 0.42 IU/mL (ref 0.30–0.70)
Heparin Unfractionated: 0.22 IU/mL — ABNORMAL LOW (ref 0.30–0.70)

## 2014-10-16 LAB — PROTIME-INR
INR: 1.55 — AB (ref 0.00–1.49)
Prothrombin Time: 18.7 seconds — ABNORMAL HIGH (ref 11.6–15.2)

## 2014-10-16 LAB — HEPARIN LEVEL (UNFRACTIONATED)
HEPARIN UNFRACTIONATED: 0.46 [IU]/mL (ref 0.30–0.70)
Heparin Unfractionated: 0.39 IU/mL (ref 0.30–0.70)
Heparin Unfractionated: 0.38 IU/mL (ref 0.30–0.70)

## 2014-10-17 LAB — PROTIME-INR
INR: 1.78 — ABNORMAL HIGH (ref 0.00–1.49)
PROTHROMBIN TIME: 20.8 s — AB (ref 11.6–15.2)

## 2014-10-17 LAB — CBC WITH DIFFERENTIAL/PLATELET
BASOS PCT: 1 % (ref 0–1)
Basophils Absolute: 0 10*3/uL (ref 0.0–0.1)
Eosinophils Absolute: 0 10*3/uL (ref 0.0–0.7)
Eosinophils Relative: 1 % (ref 0–5)
HCT: 24 % — ABNORMAL LOW (ref 36.0–46.0)
Hemoglobin: 7.8 g/dL — ABNORMAL LOW (ref 12.0–15.0)
Lymphocytes Relative: 18 % (ref 12–46)
Lymphs Abs: 0.7 10*3/uL (ref 0.7–4.0)
MCH: 27.9 pg (ref 26.0–34.0)
MCHC: 32.5 g/dL (ref 30.0–36.0)
MCV: 85.7 fL (ref 78.0–100.0)
Monocytes Absolute: 0.2 10*3/uL (ref 0.1–1.0)
Monocytes Relative: 6 % (ref 3–12)
NEUTROS ABS: 2.9 10*3/uL (ref 1.7–7.7)
NEUTROS PCT: 74 % (ref 43–77)
Platelets: 182 10*3/uL (ref 150–400)
RBC: 2.8 MIL/uL — ABNORMAL LOW (ref 3.87–5.11)
RDW: 15.8 % — AB (ref 11.5–15.5)
WBC: 3.9 10*3/uL — ABNORMAL LOW (ref 4.0–10.5)

## 2014-10-17 LAB — BASIC METABOLIC PANEL
Anion gap: 12 (ref 5–15)
BUN: 59 mg/dL — ABNORMAL HIGH (ref 6–23)
CHLORIDE: 97 meq/L (ref 96–112)
CO2: 27 mEq/L (ref 19–32)
CREATININE: 1.39 mg/dL — AB (ref 0.50–1.10)
Calcium: 7.9 mg/dL — ABNORMAL LOW (ref 8.4–10.5)
GFR, EST AFRICAN AMERICAN: 46 mL/min — AB (ref >90)
GFR, EST NON AFRICAN AMERICAN: 40 mL/min — AB (ref >90)
Glucose, Bld: 172 mg/dL — ABNORMAL HIGH (ref 70–99)
POTASSIUM: 3.8 meq/L (ref 3.7–5.3)
Sodium: 136 mEq/L — ABNORMAL LOW (ref 137–147)

## 2014-10-17 LAB — HEPARIN LEVEL (UNFRACTIONATED)
HEPARIN UNFRACTIONATED: 0.54 [IU]/mL (ref 0.30–0.70)
HEPARIN UNFRACTIONATED: 0.53 [IU]/mL (ref 0.30–0.70)
Heparin Unfractionated: 0.33 IU/mL (ref 0.30–0.70)
Heparin Unfractionated: 0.27 IU/mL — ABNORMAL LOW (ref 0.30–0.70)

## 2014-10-17 LAB — IRON AND TIBC
IRON: 32 ug/dL — AB (ref 42–135)
SATURATION RATIOS: 14 % — AB (ref 20–55)
TIBC: 233 ug/dL — ABNORMAL LOW (ref 250–470)
UIBC: 201 ug/dL (ref 125–400)

## 2014-10-18 LAB — COMPREHENSIVE METABOLIC PANEL
ALBUMIN: 1.7 g/dL — AB (ref 3.5–5.2)
ALT: 8 U/L (ref 0–35)
AST: 36 U/L (ref 0–37)
Alkaline Phosphatase: 82 U/L (ref 39–117)
Anion gap: 13 (ref 5–15)
BUN: 47 mg/dL — ABNORMAL HIGH (ref 6–23)
CO2: 25 mEq/L (ref 19–32)
CREATININE: 1.05 mg/dL (ref 0.50–1.10)
Calcium: 7.7 mg/dL — ABNORMAL LOW (ref 8.4–10.5)
Chloride: 100 mEq/L (ref 96–112)
GFR calc Af Amer: 65 mL/min — ABNORMAL LOW (ref >90)
GFR calc non Af Amer: 56 mL/min — ABNORMAL LOW (ref >90)
Glucose, Bld: 151 mg/dL — ABNORMAL HIGH (ref 70–99)
Potassium: 3.6 mEq/L — ABNORMAL LOW (ref 3.7–5.3)
SODIUM: 138 meq/L (ref 137–147)
TOTAL PROTEIN: 5.5 g/dL — AB (ref 6.0–8.3)
Total Bilirubin: 0.2 mg/dL — ABNORMAL LOW (ref 0.3–1.2)

## 2014-10-18 LAB — CBC
HCT: 22.2 % — ABNORMAL LOW (ref 36.0–46.0)
Hemoglobin: 7 g/dL — ABNORMAL LOW (ref 12.0–15.0)
MCH: 26.5 pg (ref 26.0–34.0)
MCHC: 31.5 g/dL (ref 30.0–36.0)
MCV: 84.1 fL (ref 78.0–100.0)
PLATELETS: 209 10*3/uL (ref 150–400)
RBC: 2.64 MIL/uL — ABNORMAL LOW (ref 3.87–5.11)
RDW: 15.9 % — AB (ref 11.5–15.5)
WBC: 4.3 10*3/uL (ref 4.0–10.5)

## 2014-10-18 LAB — FERRITIN: FERRITIN: 536 ng/mL — AB (ref 10–291)

## 2014-10-18 LAB — PROTIME-INR
INR: 1.83 — ABNORMAL HIGH (ref 0.00–1.49)
PROTHROMBIN TIME: 21.4 s — AB (ref 11.6–15.2)

## 2014-10-18 LAB — HEPARIN LEVEL (UNFRACTIONATED)
HEPARIN UNFRACTIONATED: 0.48 [IU]/mL (ref 0.30–0.70)
HEPARIN UNFRACTIONATED: 0.27 [IU]/mL — AB (ref 0.30–0.70)
Heparin Unfractionated: 0.33 IU/mL (ref 0.30–0.70)

## 2014-10-18 LAB — PREPARE RBC (CROSSMATCH)

## 2014-10-18 LAB — OCCULT BLOOD X 1 CARD TO LAB, STOOL: FECAL OCCULT BLD: NEGATIVE

## 2014-10-19 LAB — HEMOGLOBIN AND HEMATOCRIT, BLOOD
HCT: 25.5 % — ABNORMAL LOW (ref 36.0–46.0)
HEMOGLOBIN: 8.5 g/dL — AB (ref 12.0–15.0)

## 2014-10-19 LAB — HEPARIN LEVEL (UNFRACTIONATED)
HEPARIN UNFRACTIONATED: 0.53 [IU]/mL (ref 0.30–0.70)
HEPARIN UNFRACTIONATED: 0.51 [IU]/mL (ref 0.30–0.70)

## 2014-10-19 LAB — PROTIME-INR
INR: 1.67 — ABNORMAL HIGH (ref 0.00–1.49)
Prothrombin Time: 19.9 seconds — ABNORMAL HIGH (ref 11.6–15.2)

## 2014-10-20 LAB — PROTIME-INR
INR: 1.42 (ref 0.00–1.49)
Prothrombin Time: 17.5 seconds — ABNORMAL HIGH (ref 11.6–15.2)

## 2014-10-20 LAB — CBC
HCT: 26.1 % — ABNORMAL LOW (ref 36.0–46.0)
Hemoglobin: 8.4 g/dL — ABNORMAL LOW (ref 12.0–15.0)
MCH: 27.3 pg (ref 26.0–34.0)
MCHC: 32.2 g/dL (ref 30.0–36.0)
MCV: 84.7 fL (ref 78.0–100.0)
Platelets: 248 10*3/uL (ref 150–400)
RBC: 3.08 MIL/uL — ABNORMAL LOW (ref 3.87–5.11)
RDW: 15.9 % — ABNORMAL HIGH (ref 11.5–15.5)
WBC: 3.3 10*3/uL — ABNORMAL LOW (ref 4.0–10.5)

## 2014-10-20 LAB — BASIC METABOLIC PANEL
Anion gap: 14 (ref 5–15)
BUN: 30 mg/dL — AB (ref 6–23)
CALCIUM: 7.8 mg/dL — AB (ref 8.4–10.5)
CO2: 25 mEq/L (ref 19–32)
Chloride: 104 mEq/L (ref 96–112)
Creatinine, Ser: 0.7 mg/dL (ref 0.50–1.10)
GFR calc Af Amer: >90 mL/min (ref >90)
GFR calc non Af Amer: >90 mL/min (ref >90)
GLUCOSE: 139 mg/dL — AB (ref 70–99)
Potassium: 3.5 mEq/L — ABNORMAL LOW (ref 3.7–5.3)
Sodium: 143 mEq/L (ref 137–147)

## 2014-10-20 LAB — TYPE AND SCREEN
ABO/RH(D): B POS
ANTIBODY SCREEN: NEGATIVE
Unit division: 0
Unit division: 0

## 2014-10-20 LAB — HEPARIN LEVEL (UNFRACTIONATED)
Heparin Unfractionated: 0.63 IU/mL (ref 0.30–0.70)
Heparin Unfractionated: 0.66 IU/mL (ref 0.30–0.70)

## 2014-10-21 ENCOUNTER — Other Ambulatory Visit (HOSPITAL_COMMUNITY): Payer: Self-pay

## 2014-10-21 LAB — CBC WITH DIFFERENTIAL/PLATELET
BASOS PCT: 2 % — AB (ref 0–1)
Basophils Absolute: 0.1 10*3/uL (ref 0.0–0.1)
Eosinophils Absolute: 0.1 10*3/uL (ref 0.0–0.7)
Eosinophils Relative: 2 % (ref 0–5)
HCT: 26.5 % — ABNORMAL LOW (ref 36.0–46.0)
Hemoglobin: 8.5 g/dL — ABNORMAL LOW (ref 12.0–15.0)
Lymphocytes Relative: 25 % (ref 12–46)
Lymphs Abs: 0.9 10*3/uL (ref 0.7–4.0)
MCH: 27.3 pg (ref 26.0–34.0)
MCHC: 32.1 g/dL (ref 30.0–36.0)
MCV: 85.2 fL (ref 78.0–100.0)
MONOS PCT: 7 % (ref 3–12)
Monocytes Absolute: 0.3 10*3/uL (ref 0.1–1.0)
NEUTROS ABS: 2.1 10*3/uL (ref 1.7–7.7)
NEUTROS PCT: 64 % (ref 43–77)
PLATELETS: 234 10*3/uL (ref 150–400)
RBC: 3.11 MIL/uL — ABNORMAL LOW (ref 3.87–5.11)
RDW: 15.8 % — ABNORMAL HIGH (ref 11.5–15.5)
WBC: 3.4 10*3/uL — ABNORMAL LOW (ref 4.0–10.5)

## 2014-10-21 LAB — PROTIME-INR
INR: 1.44 (ref 0.00–1.49)
PROTHROMBIN TIME: 17.6 s — AB (ref 11.6–15.2)

## 2014-10-21 LAB — HEPARIN LEVEL (UNFRACTIONATED): HEPARIN UNFRACTIONATED: 0.58 [IU]/mL (ref 0.30–0.70)

## 2014-10-22 LAB — CBC
HCT: 28.9 % — ABNORMAL LOW (ref 36.0–46.0)
Hemoglobin: 9.3 g/dL — ABNORMAL LOW (ref 12.0–15.0)
MCH: 27.5 pg (ref 26.0–34.0)
MCHC: 32.2 g/dL (ref 30.0–36.0)
MCV: 85.5 fL (ref 78.0–100.0)
Platelets: 249 10*3/uL (ref 150–400)
RBC: 3.38 MIL/uL — ABNORMAL LOW (ref 3.87–5.11)
RDW: 15.8 % — ABNORMAL HIGH (ref 11.5–15.5)
WBC: 3 10*3/uL — AB (ref 4.0–10.5)

## 2014-10-22 LAB — BASIC METABOLIC PANEL
Anion gap: 14 (ref 5–15)
BUN: 21 mg/dL (ref 6–23)
CHLORIDE: 105 meq/L (ref 96–112)
CO2: 25 meq/L (ref 19–32)
Calcium: 7.9 mg/dL — ABNORMAL LOW (ref 8.4–10.5)
Creatinine, Ser: 0.59 mg/dL (ref 0.50–1.10)
GFR calc Af Amer: >90 mL/min (ref >90)
GFR calc non Af Amer: >90 mL/min (ref >90)
Glucose, Bld: 94 mg/dL (ref 70–99)
POTASSIUM: 3.2 meq/L — AB (ref 3.7–5.3)
Sodium: 144 mEq/L (ref 137–147)

## 2014-10-22 LAB — MAGNESIUM: MAGNESIUM: 1.2 mg/dL — AB (ref 1.5–2.5)

## 2014-10-22 LAB — PROTIME-INR
INR: 1.61 — AB (ref 0.00–1.49)
Prothrombin Time: 19.3 seconds — ABNORMAL HIGH (ref 11.6–15.2)

## 2014-10-22 LAB — PHOSPHORUS: PHOSPHORUS: 3.6 mg/dL (ref 2.3–4.6)

## 2014-10-23 LAB — CBC
HCT: 27.2 % — ABNORMAL LOW (ref 36.0–46.0)
Hemoglobin: 8.9 g/dL — ABNORMAL LOW (ref 12.0–15.0)
MCH: 28.5 pg (ref 26.0–34.0)
MCHC: 32.7 g/dL (ref 30.0–36.0)
MCV: 87.2 fL (ref 78.0–100.0)
Platelets: 263 10*3/uL (ref 150–400)
RBC: 3.12 MIL/uL — ABNORMAL LOW (ref 3.87–5.11)
RDW: 16.2 % — AB (ref 11.5–15.5)
WBC: 2.5 10*3/uL — ABNORMAL LOW (ref 4.0–10.5)

## 2014-10-23 LAB — BASIC METABOLIC PANEL
Anion gap: 14 (ref 5–15)
BUN: 16 mg/dL (ref 6–23)
CO2: 25 mEq/L (ref 19–32)
Calcium: 8.4 mg/dL (ref 8.4–10.5)
Chloride: 101 mEq/L (ref 96–112)
Creatinine, Ser: 0.59 mg/dL (ref 0.50–1.10)
GFR calc non Af Amer: >90 mL/min (ref >90)
Glucose, Bld: 92 mg/dL (ref 70–99)
POTASSIUM: 3.5 meq/L — AB (ref 3.7–5.3)
Sodium: 140 mEq/L (ref 137–147)

## 2014-10-23 LAB — PROTIME-INR
INR: 2.23 — ABNORMAL HIGH (ref 0.00–1.49)
Prothrombin Time: 24.9 seconds — ABNORMAL HIGH (ref 11.6–15.2)

## 2014-10-23 LAB — MAGNESIUM: MAGNESIUM: 1.8 mg/dL (ref 1.5–2.5)

## 2014-10-23 LAB — PHOSPHORUS: PHOSPHORUS: 3.5 mg/dL (ref 2.3–4.6)

## 2014-10-24 LAB — CBC WITH DIFFERENTIAL/PLATELET
Basophils Absolute: 0 10*3/uL (ref 0.0–0.1)
Basophils Relative: 1 % (ref 0–1)
EOS PCT: 1 % (ref 0–5)
Eosinophils Absolute: 0 10*3/uL (ref 0.0–0.7)
HCT: 25.9 % — ABNORMAL LOW (ref 36.0–46.0)
Hemoglobin: 8.4 g/dL — ABNORMAL LOW (ref 12.0–15.0)
LYMPHS ABS: 0.8 10*3/uL (ref 0.7–4.0)
LYMPHS PCT: 29 % (ref 12–46)
MCH: 28.2 pg (ref 26.0–34.0)
MCHC: 32.4 g/dL (ref 30.0–36.0)
MCV: 86.9 fL (ref 78.0–100.0)
MONO ABS: 0.2 10*3/uL (ref 0.1–1.0)
Monocytes Relative: 9 % (ref 3–12)
Neutro Abs: 1.6 10*3/uL — ABNORMAL LOW (ref 1.7–7.7)
Neutrophils Relative %: 60 % (ref 43–77)
PLATELETS: 250 10*3/uL (ref 150–400)
RBC: 2.98 MIL/uL — AB (ref 3.87–5.11)
RDW: 16.5 % — ABNORMAL HIGH (ref 11.5–15.5)
WBC: 2.7 10*3/uL — ABNORMAL LOW (ref 4.0–10.5)

## 2014-10-24 LAB — BASIC METABOLIC PANEL
Anion gap: 13 (ref 5–15)
BUN: 14 mg/dL (ref 6–23)
CALCIUM: 8.6 mg/dL (ref 8.4–10.5)
CO2: 24 mEq/L (ref 19–32)
Chloride: 104 mEq/L (ref 96–112)
Creatinine, Ser: 0.56 mg/dL (ref 0.50–1.10)
GFR calc Af Amer: >90 mL/min (ref >90)
Glucose, Bld: 98 mg/dL (ref 70–99)
Potassium: 3.8 mEq/L (ref 3.7–5.3)
SODIUM: 141 meq/L (ref 137–147)

## 2014-10-24 LAB — PROTIME-INR
INR: 2.06 — ABNORMAL HIGH (ref 0.00–1.49)
Prothrombin Time: 23.4 seconds — ABNORMAL HIGH (ref 11.6–15.2)

## 2014-10-25 LAB — CBC WITH DIFFERENTIAL/PLATELET
BASOS ABS: 0 10*3/uL (ref 0.0–0.1)
BASOS PCT: 1 % (ref 0–1)
EOS PCT: 2 % (ref 0–5)
Eosinophils Absolute: 0.1 10*3/uL (ref 0.0–0.7)
HCT: 26.2 % — ABNORMAL LOW (ref 36.0–46.0)
Hemoglobin: 8.3 g/dL — ABNORMAL LOW (ref 12.0–15.0)
Lymphocytes Relative: 43 % (ref 12–46)
Lymphs Abs: 1.4 10*3/uL (ref 0.7–4.0)
MCH: 27.1 pg (ref 26.0–34.0)
MCHC: 31.7 g/dL (ref 30.0–36.0)
MCV: 85.6 fL (ref 78.0–100.0)
MONO ABS: 0.3 10*3/uL (ref 0.1–1.0)
Monocytes Relative: 9 % (ref 3–12)
Neutro Abs: 1.5 10*3/uL — ABNORMAL LOW (ref 1.7–7.7)
Neutrophils Relative %: 45 % (ref 43–77)
Platelets: 267 10*3/uL (ref 150–400)
RBC: 3.06 MIL/uL — ABNORMAL LOW (ref 3.87–5.11)
RDW: 16.5 % — AB (ref 11.5–15.5)
WBC: 3.4 10*3/uL — ABNORMAL LOW (ref 4.0–10.5)

## 2014-10-25 LAB — BASIC METABOLIC PANEL
Anion gap: 15 (ref 5–15)
BUN: 14 mg/dL (ref 6–23)
CO2: 22 meq/L (ref 19–32)
CREATININE: 0.47 mg/dL — AB (ref 0.50–1.10)
Calcium: 8.4 mg/dL (ref 8.4–10.5)
Chloride: 103 mEq/L (ref 96–112)
GFR calc Af Amer: >90 mL/min (ref >90)
GFR calc non Af Amer: >90 mL/min (ref >90)
GLUCOSE: 100 mg/dL — AB (ref 70–99)
Potassium: 4.8 mEq/L (ref 3.7–5.3)
Sodium: 140 mEq/L (ref 137–147)

## 2014-10-25 LAB — PROTIME-INR
INR: 2.81 — ABNORMAL HIGH (ref 0.00–1.49)
Prothrombin Time: 29.8 seconds — ABNORMAL HIGH (ref 11.6–15.2)

## 2014-10-25 LAB — CBC
HCT: 25.1 % — ABNORMAL LOW (ref 36.0–46.0)
Hemoglobin: 7.8 g/dL — ABNORMAL LOW (ref 12.0–15.0)
MCH: 26.5 pg (ref 26.0–34.0)
MCHC: 31.1 g/dL (ref 30.0–36.0)
MCV: 85.4 fL (ref 78.0–100.0)
Platelets: 236 10*3/uL (ref 150–400)
RBC: 2.94 MIL/uL — AB (ref 3.87–5.11)
RDW: 16.3 % — ABNORMAL HIGH (ref 11.5–15.5)
WBC: 2.4 10*3/uL — ABNORMAL LOW (ref 4.0–10.5)

## 2014-10-26 ENCOUNTER — Other Ambulatory Visit (HOSPITAL_COMMUNITY): Payer: Self-pay

## 2014-10-26 LAB — PROTIME-INR
INR: 3.04 — ABNORMAL HIGH (ref 0.00–1.49)
Prothrombin Time: 31.7 seconds — ABNORMAL HIGH (ref 11.6–15.2)

## 2014-10-26 LAB — BASIC METABOLIC PANEL
Anion gap: 11 (ref 5–15)
BUN: 11 mg/dL (ref 6–23)
CALCIUM: 8 mg/dL — AB (ref 8.4–10.5)
CO2: 24 mEq/L (ref 19–32)
CREATININE: 0.5 mg/dL (ref 0.50–1.10)
Chloride: 109 mEq/L (ref 96–112)
GFR calc Af Amer: >90 mL/min (ref >90)
GFR calc non Af Amer: >90 mL/min (ref >90)
GLUCOSE: 95 mg/dL (ref 70–99)
Potassium: 3.7 mEq/L (ref 3.7–5.3)
Sodium: 144 mEq/L (ref 137–147)

## 2014-10-26 LAB — CBC WITH DIFFERENTIAL/PLATELET
Basophils Absolute: 0 10*3/uL (ref 0.0–0.1)
Basophils Relative: 1 % (ref 0–1)
EOS ABS: 0.1 10*3/uL (ref 0.0–0.7)
EOS PCT: 4 % (ref 0–5)
HCT: 22.8 % — ABNORMAL LOW (ref 36.0–46.0)
Hemoglobin: 7.4 g/dL — ABNORMAL LOW (ref 12.0–15.0)
LYMPHS ABS: 1.2 10*3/uL (ref 0.7–4.0)
Lymphocytes Relative: 34 % (ref 12–46)
MCH: 27.5 pg (ref 26.0–34.0)
MCHC: 32.5 g/dL (ref 30.0–36.0)
MCV: 84.8 fL (ref 78.0–100.0)
Monocytes Absolute: 0.3 10*3/uL (ref 0.1–1.0)
Monocytes Relative: 9 % (ref 3–12)
Neutro Abs: 1.9 10*3/uL (ref 1.7–7.7)
Neutrophils Relative %: 52 % (ref 43–77)
Platelets: 229 10*3/uL (ref 150–400)
RBC: 2.69 MIL/uL — ABNORMAL LOW (ref 3.87–5.11)
RDW: 16.1 % — AB (ref 11.5–15.5)
WBC: 3.5 10*3/uL — ABNORMAL LOW (ref 4.0–10.5)

## 2014-10-27 LAB — CBC
HCT: 24.3 % — ABNORMAL LOW (ref 36.0–46.0)
HEMOGLOBIN: 7.7 g/dL — AB (ref 12.0–15.0)
MCH: 27.1 pg (ref 26.0–34.0)
MCHC: 31.7 g/dL (ref 30.0–36.0)
MCV: 85.6 fL (ref 78.0–100.0)
Platelets: 229 10*3/uL (ref 150–400)
RBC: 2.84 MIL/uL — ABNORMAL LOW (ref 3.87–5.11)
RDW: 16 % — ABNORMAL HIGH (ref 11.5–15.5)
WBC: 4.3 10*3/uL (ref 4.0–10.5)

## 2014-10-27 LAB — PROTIME-INR
INR: 3.24 — ABNORMAL HIGH (ref 0.00–1.49)
Prothrombin Time: 33.3 seconds — ABNORMAL HIGH (ref 11.6–15.2)

## 2014-10-27 LAB — BASIC METABOLIC PANEL
Anion gap: 12 (ref 5–15)
BUN: 11 mg/dL (ref 6–23)
CALCIUM: 8.1 mg/dL — AB (ref 8.4–10.5)
CO2: 24 meq/L (ref 19–32)
CREATININE: 0.56 mg/dL (ref 0.50–1.10)
Chloride: 108 mEq/L (ref 96–112)
GFR calc Af Amer: >90 mL/min (ref >90)
GLUCOSE: 91 mg/dL (ref 70–99)
Potassium: 4 mEq/L (ref 3.7–5.3)
Sodium: 144 mEq/L (ref 137–147)

## 2014-10-28 LAB — BASIC METABOLIC PANEL
Anion gap: 14 (ref 5–15)
BUN: 13 mg/dL (ref 6–23)
CO2: 21 mEq/L (ref 19–32)
CREATININE: 0.47 mg/dL — AB (ref 0.50–1.10)
Calcium: 8.4 mg/dL (ref 8.4–10.5)
Chloride: 99 mEq/L (ref 96–112)
Glucose, Bld: 247 mg/dL — ABNORMAL HIGH (ref 70–99)
POTASSIUM: 4.2 meq/L (ref 3.7–5.3)
Sodium: 134 mEq/L — ABNORMAL LOW (ref 137–147)

## 2014-10-28 LAB — CBC WITH DIFFERENTIAL/PLATELET
BASOS PCT: 0 % (ref 0–1)
Basophils Absolute: 0 10*3/uL (ref 0.0–0.1)
EOS ABS: 0 10*3/uL (ref 0.0–0.7)
Eosinophils Relative: 0 % (ref 0–5)
HEMATOCRIT: 27.4 % — AB (ref 36.0–46.0)
HEMOGLOBIN: 8.9 g/dL — AB (ref 12.0–15.0)
LYMPHS ABS: 0.7 10*3/uL (ref 0.7–4.0)
Lymphocytes Relative: 14 % (ref 12–46)
MCH: 28 pg (ref 26.0–34.0)
MCHC: 32.5 g/dL (ref 30.0–36.0)
MCV: 86.2 fL (ref 78.0–100.0)
MONO ABS: 0.4 10*3/uL (ref 0.1–1.0)
Monocytes Relative: 7 % (ref 3–12)
Neutro Abs: 4.1 10*3/uL (ref 1.7–7.7)
Neutrophils Relative %: 79 % — ABNORMAL HIGH (ref 43–77)
Platelets: 230 10*3/uL (ref 150–400)
RBC: 3.18 MIL/uL — AB (ref 3.87–5.11)
RDW: 15.6 % — ABNORMAL HIGH (ref 11.5–15.5)
WBC: 5.2 10*3/uL (ref 4.0–10.5)

## 2014-10-28 LAB — PROTIME-INR
INR: 3.79 — ABNORMAL HIGH (ref 0.00–1.49)
Prothrombin Time: 37.7 seconds — ABNORMAL HIGH (ref 11.6–15.2)

## 2014-10-29 LAB — PROTIME-INR
INR: 3.54 — AB (ref 0.00–1.49)
Prothrombin Time: 35.7 seconds — ABNORMAL HIGH (ref 11.6–15.2)

## 2014-10-30 LAB — PROTIME-INR
INR: 2.26 — ABNORMAL HIGH (ref 0.00–1.49)
PROTHROMBIN TIME: 25.1 s — AB (ref 11.6–15.2)

## 2014-10-31 LAB — PROTIME-INR
INR: 1.77 — AB (ref 0.00–1.49)
PROTHROMBIN TIME: 20.8 s — AB (ref 11.6–15.2)

## 2014-11-01 ENCOUNTER — Other Ambulatory Visit (HOSPITAL_COMMUNITY): Payer: Medicare Other

## 2014-11-01 LAB — CBC WITH DIFFERENTIAL/PLATELET
Basophils Absolute: 0 10*3/uL (ref 0.0–0.1)
Basophils Relative: 0 % (ref 0–1)
Eosinophils Absolute: 0.1 10*3/uL (ref 0.0–0.7)
Eosinophils Relative: 2 % (ref 0–5)
HEMATOCRIT: 22.3 % — AB (ref 36.0–46.0)
Hemoglobin: 7 g/dL — ABNORMAL LOW (ref 12.0–15.0)
LYMPHS ABS: 1.2 10*3/uL (ref 0.7–4.0)
LYMPHS PCT: 26 % (ref 12–46)
MCH: 26.2 pg (ref 26.0–34.0)
MCHC: 31.4 g/dL (ref 30.0–36.0)
MCV: 83.5 fL (ref 78.0–100.0)
Monocytes Absolute: 0.5 10*3/uL (ref 0.1–1.0)
Monocytes Relative: 12 % (ref 3–12)
Neutro Abs: 2.7 10*3/uL (ref 1.7–7.7)
Neutrophils Relative %: 60 % (ref 43–77)
PLATELETS: 250 10*3/uL (ref 150–400)
RBC: 2.67 MIL/uL — AB (ref 3.87–5.11)
RDW: 15.3 % (ref 11.5–15.5)
WBC: 4.5 10*3/uL (ref 4.0–10.5)

## 2014-11-01 LAB — COMPREHENSIVE METABOLIC PANEL
ALK PHOS: 81 U/L (ref 39–117)
ALT: 10 U/L (ref 0–35)
ANION GAP: 13 (ref 5–15)
AST: 16 U/L (ref 0–37)
Albumin: 2.2 g/dL — ABNORMAL LOW (ref 3.5–5.2)
BUN: 10 mg/dL (ref 6–23)
CO2: 23 meq/L (ref 19–32)
Calcium: 8.7 mg/dL (ref 8.4–10.5)
Chloride: 103 mEq/L (ref 96–112)
Creatinine, Ser: 0.5 mg/dL (ref 0.50–1.10)
Glucose, Bld: 88 mg/dL (ref 70–99)
Potassium: 3.8 mEq/L (ref 3.7–5.3)
SODIUM: 139 meq/L (ref 137–147)
TOTAL PROTEIN: 6.7 g/dL (ref 6.0–8.3)
Total Bilirubin: 0.4 mg/dL (ref 0.3–1.2)

## 2014-11-01 LAB — PROTIME-INR
INR: 1.61 — ABNORMAL HIGH (ref 0.00–1.49)
PROTHROMBIN TIME: 19.3 s — AB (ref 11.6–15.2)

## 2014-11-01 LAB — HEMOGLOBIN AND HEMATOCRIT, BLOOD
HCT: 24.4 % — ABNORMAL LOW (ref 36.0–46.0)
Hemoglobin: 7.7 g/dL — ABNORMAL LOW (ref 12.0–15.0)

## 2014-11-01 LAB — PREPARE RBC (CROSSMATCH)

## 2014-11-02 LAB — HEMOGLOBIN AND HEMATOCRIT, BLOOD
HCT: 22.2 % — ABNORMAL LOW (ref 36.0–46.0)
Hemoglobin: 7.1 g/dL — ABNORMAL LOW (ref 12.0–15.0)

## 2014-11-02 LAB — IRON AND TIBC
IRON: 14 ug/dL — AB (ref 42–135)
Saturation Ratios: 8 % — ABNORMAL LOW (ref 20–55)
TIBC: 166 ug/dL — ABNORMAL LOW (ref 250–470)
UIBC: 152 ug/dL (ref 125–400)

## 2014-11-02 LAB — PROTIME-INR
INR: 1.91 — AB (ref 0.00–1.49)
Prothrombin Time: 22.1 seconds — ABNORMAL HIGH (ref 11.6–15.2)

## 2014-11-02 LAB — FERRITIN: FERRITIN: 944 ng/mL — AB (ref 10–291)

## 2014-11-03 LAB — CBC
HCT: 23.4 % — ABNORMAL LOW (ref 36.0–46.0)
HEMOGLOBIN: 7.2 g/dL — AB (ref 12.0–15.0)
MCH: 25.4 pg — ABNORMAL LOW (ref 26.0–34.0)
MCHC: 30.8 g/dL (ref 30.0–36.0)
MCV: 82.7 fL (ref 78.0–100.0)
Platelets: 284 10*3/uL (ref 150–400)
RBC: 2.83 MIL/uL — AB (ref 3.87–5.11)
RDW: 15.2 % (ref 11.5–15.5)
WBC: 5.4 10*3/uL (ref 4.0–10.5)

## 2014-11-03 LAB — PROTIME-INR
INR: 1.89 — AB (ref 0.00–1.49)
Prothrombin Time: 21.9 seconds — ABNORMAL HIGH (ref 11.6–15.2)

## 2014-11-03 LAB — PREPARE RBC (CROSSMATCH)

## 2014-11-04 LAB — BASIC METABOLIC PANEL
ANION GAP: 14 (ref 5–15)
BUN: 15 mg/dL (ref 6–23)
CALCIUM: 9.1 mg/dL (ref 8.4–10.5)
CO2: 24 mEq/L (ref 19–32)
Chloride: 101 mEq/L (ref 96–112)
Creatinine, Ser: 0.46 mg/dL — ABNORMAL LOW (ref 0.50–1.10)
GFR calc Af Amer: >90 mL/min (ref >90)
GFR calc non Af Amer: >90 mL/min (ref >90)
Glucose, Bld: 106 mg/dL — ABNORMAL HIGH (ref 70–99)
Potassium: 3.9 mEq/L (ref 3.7–5.3)
SODIUM: 139 meq/L (ref 137–147)

## 2014-11-04 LAB — CBC
HCT: 30 % — ABNORMAL LOW (ref 36.0–46.0)
Hemoglobin: 9.9 g/dL — ABNORMAL LOW (ref 12.0–15.0)
MCH: 27 pg (ref 26.0–34.0)
MCHC: 33 g/dL (ref 30.0–36.0)
MCV: 81.7 fL (ref 78.0–100.0)
PLATELETS: 285 10*3/uL (ref 150–400)
RBC: 3.67 MIL/uL — ABNORMAL LOW (ref 3.87–5.11)
RDW: 14.6 % (ref 11.5–15.5)
WBC: 5.2 10*3/uL (ref 4.0–10.5)

## 2014-11-04 LAB — PROTIME-INR
INR: 2.04 — ABNORMAL HIGH (ref 0.00–1.49)
Prothrombin Time: 23.2 seconds — ABNORMAL HIGH (ref 11.6–15.2)

## 2014-11-05 LAB — TYPE AND SCREEN
ABO/RH(D): B POS
Antibody Screen: NEGATIVE
Unit division: 0
Unit division: 0

## 2014-11-07 ENCOUNTER — Non-Acute Institutional Stay (SKILLED_NURSING_FACILITY): Payer: Medicare Other | Admitting: Internal Medicine

## 2014-11-07 DIAGNOSIS — J1520 Pneumonia due to staphylococcus, unspecified: Secondary | ICD-10-CM

## 2014-11-07 DIAGNOSIS — I82290 Acute embolism and thrombosis of other thoracic veins: Secondary | ICD-10-CM

## 2014-11-07 DIAGNOSIS — I82210 Acute embolism and thrombosis of superior vena cava: Secondary | ICD-10-CM

## 2014-11-07 DIAGNOSIS — N179 Acute kidney failure, unspecified: Secondary | ICD-10-CM

## 2014-11-07 DIAGNOSIS — R7881 Bacteremia: Secondary | ICD-10-CM

## 2014-11-07 DIAGNOSIS — E1311 Other specified diabetes mellitus with ketoacidosis with coma: Secondary | ICD-10-CM

## 2014-11-07 DIAGNOSIS — I5022 Chronic systolic (congestive) heart failure: Secondary | ICD-10-CM

## 2014-11-07 DIAGNOSIS — B9561 Methicillin susceptible Staphylococcus aureus infection as the cause of diseases classified elsewhere: Secondary | ICD-10-CM

## 2014-11-07 DIAGNOSIS — I639 Cerebral infarction, unspecified: Secondary | ICD-10-CM

## 2014-11-07 DIAGNOSIS — J9601 Acute respiratory failure with hypoxia: Secondary | ICD-10-CM

## 2014-11-07 DIAGNOSIS — E1111 Type 2 diabetes mellitus with ketoacidosis with coma: Secondary | ICD-10-CM

## 2014-11-07 DIAGNOSIS — D638 Anemia in other chronic diseases classified elsewhere: Secondary | ICD-10-CM

## 2014-11-07 DIAGNOSIS — F209 Schizophrenia, unspecified: Secondary | ICD-10-CM

## 2014-11-07 DIAGNOSIS — R1310 Dysphagia, unspecified: Secondary | ICD-10-CM

## 2014-11-07 NOTE — Progress Notes (Signed)
MRN: [id number] Name: [name]  Sex: female Age: 63 y.o. DOB: 1951/05/20  PSC #: Sonny Dandy Facility/Room: 312 Level Of Care: SNF Provider: Merrilee Seashore D Emergency Contacts: Extended Emergency Contact Information Primary Emergency Contact: Lowe,Richard Address: [address]          [place], [place] [address] D[place] of [place] Home Phone: [phone] Mobile Phone: [phone] Relation: Significant other     Allergies: Penicillins  Chief Complaint  Patient presents with  . New Admit To SNF    HPI: Patient is 63 y.o. female who who suffered MSSA bacteremia with failure of multiple organ systems,outloined below, protracted course  at [facility] then [facility] and now stable enough to admit to SNF for OT/PT.  Past Medical History  Diagnosis Date  . Mixed stress and urge urinary incontinence   . Schizophrenia   . Allergy     History reviewed. No pertinent past surgical history.    Medication List       This list is accurate as of: 11/07/14 11:59 PM.  Always use your most recent med list.               ARIPiprazole 10 MG tablet  Commonly known as:  ABILIFY  Take 10 mg by mouth daily.     atorvastatin 10 MG tablet  Commonly known as:  LIPITOR  Take 1 tablet (10 mg total) by mouth daily.     benztropine 1 MG tablet  Commonly known as:  COGENTIN  Take 1 mg by mouth daily.     buPROPion 150 MG 24 hr tablet  Commonly known as:  WELLBUTRIN XL  Take 150 mg by mouth daily.     ceFAZolin 1-5 GM-%  Commonly known as:  ANCEF  Inject 50 mLs (1 g total) into the vein daily.     docusate sodium 100 MG capsule  Commonly known as:  COLACE  Take 100 mg by mouth daily.     feeding supplement (PRO-STAT SUGAR FREE 64) Liqd  Take 64 mLs by mouth 2 (two) times daily with a meal.     ferrous sulfate 325 (65 FE) MG tablet  Take 325 mg by mouth daily with breakfast.     insulin aspart 100 UNIT/ML injection  Commonly known as:  novoLOG  Inject into the skin  3 (three) times daily before meals. SSI     insulin detemir 100 UNIT/ML injection  Commonly known as:  LEVEMIR  Inject 12 Units into the skin daily.     mirtazapine 15 MG tablet  Commonly known as:  REMERON  Take 15 mg by mouth at bedtime.     pantoprazole 40 MG tablet  Commonly known as:  PROTONIX  Take 40 mg by mouth daily.     polyethylene glycol packet  Commonly known as:  MIRALAX / GLYCOLAX  Take 17 g by mouth daily.     potassium chloride SA 20 MEQ tablet  Commonly known as:  K-DUR,KLOR-CON  Take 20 mEq by mouth daily.     warfarin 5 MG tablet  Commonly known as:  COUMADIN  Take 5 mg by mouth daily.        Meds ordered this encounter  Medications  . insulin aspart (NOVOLOG) 100 UNIT/ML injection    Sig: Inject into the skin 3 (three) times daily before meals. SSI  . ARIPiprazole (ABILIFY) 10 MG tablet    Sig: Take 10 mg by mouth daily.  . benztropine (COGENTIN) 1 MG tablet    Sig:  Take 1 mg by mouth daily.  Marland Kitchen. docusate sodium (COLACE) 100 MG capsule    Sig: Take 100 mg by mouth daily.  . ferrous sulfate 325 (65 FE) MG tablet    Sig: Take 325 mg by mouth daily with breakfast.  . warfarin (COUMADIN) 5 MG tablet    Sig: Take 5 mg by mouth daily.  . insulin detemir (LEVEMIR) 100 UNIT/ML injection    Sig: Inject 12 Units into the skin daily.  . polyethylene glycol (MIRALAX / GLYCOLAX) packet    Sig: Take 17 g by mouth daily.  . mirtazapine (REMERON) 15 MG tablet    Sig: Take 15 mg by mouth at bedtime.  . pantoprazole (PROTONIX) 40 MG tablet    Sig: Take 40 mg by mouth daily.  . potassium chloride SA (K-DUR,KLOR-CON) 20 MEQ tablet    Sig: Take 20 mEq by mouth daily.  . Amino Acids-Protein Hydrolys (FEEDING SUPPLEMENT, PRO-STAT SUGAR FREE 64,) LIQD    Sig: Take 64 mLs by mouth 2 (two) times daily with a meal.     There is no immunization history on file for this patient.  History  Substance Use Topics  . Smoking status: Former Smoker -- 3.00 packs/day     Types: Cigarettes  . Smokeless tobacco: Not on file  . Alcohol Use: 3.6 oz/week    6 Cans of beer per week    Family history is noncontributory    Review of Systems  DATA OBTAINED: from patient GENERAL:  no fevers, fatigue, appetite changes SKIN: No itching, rash or wounds EYES: No eye pain, redness, discharge EARS: No earache, tinnitus, change in hearing NOSE: No congestion, drainage or bleeding  MOUTH/THROAT: No mouth or tooth pain, No sore throat RESPIRATORY: No cough, wheezing, SOB CARDIAC: No chest pain, palpitations, lower extremity edema  GI: No abdominal pain, No N/V/D or constipation, No heartburn or reflux  GU: No dysuria, frequency or urgency, or incontinence  MUSCULOSKELETAL: No unrelieved bone/joint pain NEUROLOGIC: No headache, dizziness o PSYCHIATRIC: , No behavior issue.   Filed Vitals:   11/07/14 1252  BP: 114/70  Pulse: 102  Temp: 97.3 F (36.3 C)  Resp: 20    Physical Exam  GENERAL APPEARANCE: Alert, modconversant,  No acute distress.  SKIN: No diaphoresis rash HEAD: Normocephalic, atraumatic  EYES: Conjunctiva/lids clear. Pupils round, reactive. EOMs intact.  EARS: External exam WNL, canals clear. Hearing grossly normal.  NOSE: No deformity or discharge.  MOUTH/THROAT: Lips w/o lesions  RESPIRATORY: Breathing is even, unlabored. Lung sounds are decreased but still gooAF   CARDIOVASCULAR: Heart RRR no murmurs, rubs or gallops. No peripheral edema.   GASTROINTESTINAL: Abdomen is soft, non-tender, not distended w/ normal bowel sounds. GENITOURINARY: Bladder non tender, not distended  MUSCULOSKELETAL: No abnormal joints or musculature NEUROLOGIC:  Cranial nerves 2-12 grossly intact. Moves all extremities  PSYCHIATRIC: vague affect, no behavioral issues  Patient Active Problem List   Diagnosis Date Noted  . Pneumonia due to Staphylococcus 11/08/2014  . 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 CVA (cerebral infarction) 11/08/2014  . Chronic  systolic congestive heart failure 11/08/2014  . Anemia of chronic disease 11/08/2014  . Dysphagia 11/08/2014  . Schizophrenia, unspecified type 11/08/2014  . Bacteremia due to Staphylococcus aureus   . Acute respiratory failure   . Dehydration   . Community acquired pneumonia   . Anoxic brain injury   . Acute respiratory failure with hypoxia   . AKI (acute kidney injury)   . Septic shock 10/02/2014  .  Encephalopathy acute 10/02/2014  . Altered mental status 10/01/2014  . Foreign body alimentary tract 10/01/2014  . Shock   . Cardiac arrest   . Foreign body ingestion   . Type II or unspecified type diabetes mellitus with unspecified complication, uncontrolled 05/22/2014  . DKA (diabetic ketoacidoses) 05/22/2014  . Constipation 05/22/2014  . Mood disorder 05/22/2014    CBC    Component Value Date/Time   WBC 5.2 11/04/2014 0744   RBC 3.67* 11/04/2014 0744   HGB 9.9* 11/04/2014 0744   HCT 30.0* 11/04/2014 0744   PLT 285 11/04/2014 0744   MCV 81.7 11/04/2014 0744   LYMPHSABS 1.2 11/01/2014 0525   MONOABS 0.5 11/01/2014 0525   EOSABS 0.1 11/01/2014 0525   BASOSABS 0.0 11/01/2014 0525    CMP     Component Value Date/Time   NA 139 11/04/2014 0744   K 3.9 11/04/2014 0744   CL 101 11/04/2014 0744   CO2 24 11/04/2014 0744   GLUCOSE 106* 11/04/2014 0744   BUN 15 11/04/2014 0744   CREATININE 0.46* 11/04/2014 0744   CREATININE 1.02 05/22/2014 1001   CALCIUM 9.1 11/04/2014 0744   PROT 6.7 11/01/2014 0525   ALBUMIN 2.2* 11/01/2014 0525   AST 16 11/01/2014 0525   ALT 10 11/01/2014 0525   ALKPHOS 81 11/01/2014 0525   BILITOT 0.4 11/01/2014 0525   GFRNONAA >90 11/04/2014 0744   GFRAA >90 11/04/2014 0744    Assessment and Plan  Bacteremia due to Staphylococcus aureus MSSA PNA treated with Ancef to continue until 12/30; pt was intabated  Acute respiratory failure with hypoxia 2/2 MSSA PNA and tx with Ancef and intubated and weaned and on 2L [place]  Pneumonia due to  Staphylococcus Txed in hosp with Ancef to continue until 12/30; intubated for same  DKA (diabetic ketoacidoses) Requiring stress dose steroids and pressors at time of presentation of bacteremia along with intubation  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 with clot in R atrium and dialysis catheter, txed with IV heparin , then coumadin complicated by anemia for which she received blood products  AKI (acute kidney injury) During presentation of bacteremia, requiring dialysis and subsequent clotting of catheter and SVC and R atrium, resolved  CVA (cerebral infarction) Small infarcts in both L and R occipital lobes  Chronic systolic congestive heart failure Stable at this time  Anemia of chronic disease Received 4 u PRBC, 2 just prior to d/c from [facility] for Hb 7.2, presumed from bone marrow not working at same time multiple other systems not working  Dysphagia With failed barium swallow, requiring feeds per NG tube, now on dysphagia 2 diet with thin liquids  Schizophrenia, unspecified type tx with cogentin, abilify, wellbutrin;stable at this time   Pt seen 11/05/2014 Margit HanksALEXANDER, Haig Gerardo D, MD

## 2014-11-08 ENCOUNTER — Encounter: Payer: Self-pay | Admitting: Internal Medicine

## 2014-11-08 DIAGNOSIS — J1520 Pneumonia due to staphylococcus, unspecified: Secondary | ICD-10-CM | POA: Insufficient documentation

## 2014-11-08 DIAGNOSIS — I82210 Acute embolism and thrombosis of superior vena cava: Secondary | ICD-10-CM | POA: Insufficient documentation

## 2014-11-08 DIAGNOSIS — R1310 Dysphagia, unspecified: Secondary | ICD-10-CM | POA: Insufficient documentation

## 2014-11-08 DIAGNOSIS — I5022 Chronic systolic (congestive) heart failure: Secondary | ICD-10-CM | POA: Insufficient documentation

## 2014-11-08 DIAGNOSIS — F209 Schizophrenia, unspecified: Secondary | ICD-10-CM | POA: Insufficient documentation

## 2014-11-08 DIAGNOSIS — D638 Anemia in other chronic diseases classified elsewhere: Secondary | ICD-10-CM | POA: Insufficient documentation

## 2014-11-08 DIAGNOSIS — I639 Cerebral infarction, unspecified: Secondary | ICD-10-CM | POA: Insufficient documentation

## 2014-11-08 DIAGNOSIS — F259 Schizoaffective disorder, unspecified: Secondary | ICD-10-CM | POA: Insufficient documentation

## 2014-11-08 DIAGNOSIS — Z8673 Personal history of transient ischemic attack (TIA), and cerebral infarction without residual deficits: Secondary | ICD-10-CM | POA: Insufficient documentation

## 2014-11-08 NOTE — Assessment & Plan Note (Signed)
MSSA PNA treated with Ancef to continue until 12/30; pt was intabated

## 2014-11-08 NOTE — Assessment & Plan Note (Signed)
Stable at this time 

## 2014-11-08 NOTE — Assessment & Plan Note (Signed)
2/2 MSSA PNA and tx with Ancef and intubated and weaned and on 2L [place]

## 2014-11-08 NOTE — Assessment & Plan Note (Signed)
During presentation of bacteremia, requiring dialysis and subsequent clotting of catheter and SVC and R atrium, resolved

## 2014-11-08 NOTE — Assessment & Plan Note (Signed)
Requiring stress dose steroids and pressors at time of presentation of bacteremia along with intubation

## 2014-11-08 NOTE — Assessment & Plan Note (Signed)
With failed barium swallow, requiring feeds per NG tube, now on dysphagia 2 diet with thin liquids

## 2014-11-08 NOTE — Assessment & Plan Note (Signed)
Received 4 u PRBC, 2 just prior to d/c from [facility] for Hb 7.2, presumed from bone marrow not working at same time multiple other systems not working

## 2014-11-08 NOTE — Assessment & Plan Note (Signed)
Txed in hosp with Ancef to continue until 12/30; intubated for same

## 2014-11-08 NOTE — Assessment & Plan Note (Signed)
Noted on TEE with clot in R atrium and dialysis catheter, txed with IV heparin , then coumadin complicated by anemia for which she received blood products

## 2014-11-08 NOTE — Assessment & Plan Note (Signed)
tx with cogentin, abilify, wellbutrin;stable at this time

## 2014-11-08 NOTE — Assessment & Plan Note (Signed)
Small infarcts in both L and R occipital lobes

## 2014-11-16 ENCOUNTER — Non-Acute Institutional Stay (SKILLED_NURSING_FACILITY): Payer: Medicare Other | Admitting: Nurse Practitioner

## 2014-11-16 DIAGNOSIS — J9601 Acute respiratory failure with hypoxia: Secondary | ICD-10-CM

## 2014-11-16 DIAGNOSIS — I5022 Chronic systolic (congestive) heart failure: Secondary | ICD-10-CM

## 2014-11-16 DIAGNOSIS — I82290 Acute embolism and thrombosis of other thoracic veins: Secondary | ICD-10-CM

## 2014-11-16 DIAGNOSIS — D638 Anemia in other chronic diseases classified elsewhere: Secondary | ICD-10-CM

## 2014-11-16 DIAGNOSIS — E1122 Type 2 diabetes mellitus with diabetic chronic kidney disease: Secondary | ICD-10-CM

## 2014-11-16 DIAGNOSIS — I82210 Acute embolism and thrombosis of superior vena cava: Secondary | ICD-10-CM

## 2014-11-16 DIAGNOSIS — J1520 Pneumonia due to staphylococcus, unspecified: Secondary | ICD-10-CM

## 2014-11-16 DIAGNOSIS — R1310 Dysphagia, unspecified: Secondary | ICD-10-CM

## 2014-11-16 DIAGNOSIS — N189 Chronic kidney disease, unspecified: Secondary | ICD-10-CM

## 2014-11-16 DIAGNOSIS — F209 Schizophrenia, unspecified: Secondary | ICD-10-CM

## 2014-11-16 NOTE — Progress Notes (Signed)
Patient ID: [name], female   DOB: [birth year], 63 y.o.   MRN: [id number]    [facility]   Place of Service:    PCP: No PCP Per Patient  Allergies  Allergen Reactions  . Penicillins     Rash     Chief Complaint  Patient presents with  . Discharge Note    HPI:  Patient is a 63 y.o. female [facility]. Pt suffered MSSA bacteremia with failure of multiple organ systems. [facility] and [facility] antibiotics. Pt cons ancef IV until 12/30 and then would like to be discharged home with sister. Patient currently doing well with therapy, now stable to discharge home with home health.   Review of Systems:  Review of Systems  Constitutional: Negative for activity change, appetite change, fatigue and unexpected weight change.  HENT: Negative for congestion and hearing loss.   Eyes: Negative.   Respiratory: Negative for cough and shortness of breath.   Cardiovascular: Negative for chest pain, palpitations and leg swelling.  Gastrointestinal: Negative for abdominal pain, diarrhea and constipation.  Genitourinary: Negative for dysuria and difficulty urinating.  Musculoskeletal: Negative for myalgias and arthralgias.  Skin: Negative for color change and wound.  Neurological: Positive for weakness (improving). Negative for dizziness.  Psychiatric/Behavioral: Negative for behavioral problems, confusion and agitation.    Past Medical History  Diagnosis Date  . Mixed stress and urge urinary incontinence   . Schizophrenia   . Allergy    No past surgical history on file. Social History:   reports that she has quit smoking. Her smoking use included Cigarettes. She smoked 3.00 packs per day. She does not have any smokeless tobacco history on file. She reports that she drinks about 3.6 oz of alcohol per week. Her drug history is not on  file.  Family History  Problem Relation Age of Onset  . Heart disease Mother   . Emphysema Paternal Grandfather   . Hypertension Father   . Stroke Father   . Heart disease Father     Medications: Patient's Medications  New Prescriptions   No medications on file  Previous Medications   AMINO ACIDS-PROTEIN HYDROLYS (FEEDING SUPPLEMENT, PRO-STAT SUGAR FREE 64,) LIQD    Take 64 mLs by mouth 2 (two) times daily with a meal.   ARIPIPRAZOLE (ABILIFY) 10 MG TABLET    Take 10 mg by mouth daily.   ATORVASTATIN (LIPITOR) 10 MG TABLET    Take 1 tablet (10 mg total) by mouth daily.   BENZTROPINE (COGENTIN) 1 MG TABLET    Take 1 mg by mouth daily.   BUPROPION (WELLBUTRIN XL) 150 MG 24 HR TABLET    Take 150 mg by mouth daily.   CEFAZOLIN (ANCEF) 1-5 GM-%    Inject 50 mLs (1 g total) into the vein daily.   DOCUSATE SODIUM (COLACE) 100 MG CAPSULE    Take 100 mg by mouth daily.   FERROUS SULFATE 325 (65 FE) MG TABLET    Take 325 mg by mouth daily with breakfast.   INSULIN ASPART (NOVOLOG) 100 UNIT/ML INJECTION    Inject into the skin 3 (three) times daily before meals. SSI   INSULIN DETEMIR (LEVEMIR) 100 UNIT/ML INJECTION    Inject 12 Units into the skin daily.   MIRTAZAPINE (REMERON) 15 MG TABLET    Take 15 mg by mouth at bedtime.  PANTOPRAZOLE (PROTONIX) 40 MG TABLET    Take 40 mg by mouth daily.   POLYETHYLENE GLYCOL (MIRALAX / GLYCOLAX) PACKET    Take 17 g by mouth daily.   POTASSIUM CHLORIDE SA (K-DUR,KLOR-CON) 20 MEQ TABLET    Take 20 mEq by mouth daily.   WARFARIN (COUMADIN) 5 MG TABLET    Take 5 mg by mouth daily.  Modified Medications   No medications on file  Discontinued Medications   No medications on file     Physical Exam: Filed Vitals:   11/16/14 1250  BP: 90/57  Pulse: 78  Temp: 97.5 F (36.4 C)  Resp: 20    Physical Exam  Constitutional: She is oriented to person, place, and time. No distress.  Frail female in NAD  HENT:  Head: Normocephalic and atraumatic.   Mouth/Throat: Oropharynx is clear and moist. No oropharyngeal exudate.  Eyes: Conjunctivae are normal. Pupils are equal, round, and reactive to light.  Neck: Normal range of motion. Neck supple.  Cardiovascular: Normal rate, regular rhythm and normal heart sounds.   Pulmonary/Chest: Effort normal and breath sounds normal.  Abdominal: Soft. Bowel sounds are normal.  Musculoskeletal: She exhibits no edema or tenderness.  Neurological: She is alert and oriented to person, place, and time.  Skin: Skin is warm and dry. She is not diaphoretic.  Psychiatric: She has a normal mood and affect.    Labs reviewed: Basic Metabolic Panel:  Recent Labs  13/24/4011/24/15 0458 10/13/14 0450  10/22/14 0614 10/23/14 0627  10/28/14 0500 11/01/14 0525 11/04/14 0744  NA 140 141  < > 144 140  < > 134* 139 139  K 3.9 4.1  < > 3.2* 3.5*  < > 4.2 3.8 3.9  CL 101 100  < > 105 101  < > 99 103 101  CO2 24 23  < > 25 25  < > 21 23 24   GLUCOSE 114* 124*  < > 94 92  < > 247* 88 106*  BUN 37* 58*  < > 21 16  < > 13 10 15   CREATININE 2.25* 3.34*  < > 0.59 0.59  < > 0.47* 0.50 0.46*  CALCIUM 7.7* 7.8*  < > 7.9* 8.4  < > 8.4 8.7 9.1  MG 2.1  --   --  1.2* 1.8  --   --   --   --   PHOS 4.1 6.3*  --  3.6 3.5  --   --   --   --   < > = values in this interval not displayed. Liver Function Tests:  Recent Labs  10/14/14 0500 10/18/14 0052 11/01/14 0525  AST 18 36 16  ALT 5 8 10   ALKPHOS 86 82 81  BILITOT 0.3 0.2* 0.4  PROT 6.4 5.5* 6.7  ALBUMIN 1.9* 1.7* 2.2*   No results for input(s): LIPASE, AMYLASE in the last 8760 hours. No results for input(s): AMMONIA in the last 8760 hours. CBC:  Recent Labs  10/26/14 0530  10/28/14 0500 11/01/14 0525  11/02/14 0742 11/03/14 0704 11/04/14 0744  WBC 3.5*  < > 5.2 4.5  --   --  5.4 5.2  NEUTROABS 1.9  --  4.1 2.7  --   --   --   --   HGB 7.4*  < > 8.9* 7.0*  < > 7.1* 7.2* 9.9*  HCT 22.8*  < > 27.4* 22.3*  < > 22.2* 23.4* 30.0*  MCV 84.8  < > 86.2 83.5  --    --  82.7 81.7  PLT 229  < > 230 250  --   --  284 285  < > = values in this interval not displayed. TSH:  Recent Labs  10/14/14 0500  TSH 3.970   A1C: Lab Results  Component Value Date   HGBA1C 12.8* 05/22/2014   Lipid Panel:  Recent Labs  10/12/14 1530  CHOL 130  HDL 23*  LDLCALC 77  TRIG 409*  CHOLHDL 5.7      Assessment/Plan 1. Chronic systolic congestive heart failure Controlled on current lasix does, conts lasix and potassium  2. Superior vena caval thrombosis Currently on coumadin, last INR was 1.6 however it was found that pt had missed several doses. Previously was therapeutic on 5 mg daily will resume this does for discharge and have HH check pt/inr on 11/22/13  3. Acute respiratory failure with hypoxia Resolved, pt completely off O2   4. Pneumonia due to Staphylococcus Completes ancef on 12/30  5. Type 2 diabetes mellitus with diabetic chronic kidney disease DKA in hospital, insulin started blood sugars in good range SSI rarely used on review of CBGs Will cont levemir ONLY outpt and dc SSI, staff to educate on how to administer, [facility] nursing for ongoing education To check CBGs BID and bring to PCP for ongoing monitoring   6. Anemia of chronic disease Received 4 u PRBC in hospital, will hgb will need to be followed by PCP, conts on iron  7. Dysphagia now on dysphagia 2 diet with thin liquids  8. Schizophrenia, unspecified type Stable with cogentin, abilify, wellbutrin  pt is stable for discharge-will need PT/OT/Nursing per home health. No DME needed. Rx written.  will need to follow up with PCP within 2 weeks.

## 2014-11-29 ENCOUNTER — Other Ambulatory Visit: Payer: Self-pay | Admitting: Internal Medicine

## 2015-01-06 ENCOUNTER — Ambulatory Visit (INDEPENDENT_AMBULATORY_CARE_PROVIDER_SITE_OTHER): Payer: Self-pay | Admitting: Family Medicine

## 2015-01-06 VITALS — BP 134/78 | HR 72 | Temp 98.1°F | Resp 16 | Ht 65.25 in | Wt 141.4 lb

## 2015-01-06 DIAGNOSIS — Z021 Encounter for pre-employment examination: Secondary | ICD-10-CM

## 2015-01-06 DIAGNOSIS — Z024 Encounter for examination for driving license: Secondary | ICD-10-CM

## 2015-01-06 NOTE — Patient Instructions (Signed)
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 Schizophrenia is disqualifying by [facility], but you can call and discuss this further with the dept of transportation as you have been on medications for some time and stable recently.  Your diabetes control was unstable last year with 2 hospitalizations for diabetic ketoacidosis. If you require insulin, this is also disqualifying for DOT card.  You were also noted to have signs of a stroke on CT scan of your head on 10/05/14.  Based on the location of this stroke, the [facility] recommends a 5 year waiting period prior to driving.   If you or your other medical providers have any questions regarding this physical, please let me know.

## 2015-01-06 NOTE — Progress Notes (Addendum)
Subjective:  This chart was scribed for Abigail Staggers, MD by Evon Slack, ED Scribe. This Patient was seen in room 01 and the patients care was started at 7:50 PM   Patient ID: [name], female    DOB: 1951/09/25, 64 y.o.   MRN: [id number]  Chief Complaint  Patient presents with  . DOT physical    HPI HPI Comments: [name] is a 64 y.o. female who presents to the [facility] for DOT physical. Pt states that last DOT was physical was one about 2-4 years prior.  Hx reviewed per DOT exam report positive only for diabetes and nervous and/or psychiatric disorders. However in review of [facility] she was discharged from rehab facility after bacteremia, multi organ system failure in November 2015. Noted to have severe cardiomyopathy,  RUE DVT,  acute encephalopathy, DKA,  acute kidney injury as result of sepsis. Pt states that she doesn't have memory of going into the hospital.  PCP [facility] was last seen 1 day ago for medication change.  Seen diabetes Dr Shawnee Knapp today. States she was taken off of insulin and is not sure of new medication.     Sepsis  - status post ancef for 6 weeks from November 18, 2014.   RUE thrombus -  treated with heparin in hospital  transitioned to coumadin. Pt states she is still compliant with taking the coumadin. Pt states that her last I&R was done today and was told to stay on current dose of coumadin.   Acute encephalopathy - with new CVA. CVA was by head CT on October 05, 2014. Small cortical infarct in right occipital lobe, probable mild subcortical infarct on left.  Noted to have metabolic encephalopathy by EEG. No focal weakness. Did have dysphagia by review of records.   Cardiomyopathy with EF of 35-40% on echocardiogram on October 02, 2014  Acute kidney injury. Was treated with dialysis in hospital. With plan of renal consult as out patient.   Diabetes -  noted to be in DKA when admitted to hospital. Discharged on  sliding scale insulin and Lantus. She was continue on Levemir based on December 29th progress note from rehab facility. Pt was also admitted for DKA in July 2015. Pt states that her blood sugar has been in the 120 range. Pt denies having low blood sugar symptoms, was on insulin until visit with diabetes specialist today where this was discontinued.   Schizophrenia  - continued on cogentin Abilify and Wellbutrin based on December 29 th discharge notes from rehab. Followed by mental health provider and denies any recent psychotic symptoms.  Pt states she was diagnosed 15-20 years prior. Pt states she goes to [employer] and has been compliant with all medications prescribed. Pt states her Schizophrenia has been stable. Pt states that she has not had any schizophrenia complications since taking the medication she is currently prescribed.   Pt states she does wear glasses and has not seen her eye doctor in the past 6-7 months. Pt states she has noticed that her vision has changed.   Patient Active Problem List   Diagnosis Date Noted  . Pneumonia due to Staphylococcus 11/08/2014  . 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 CVA (cerebral infarction) 11/08/2014  . Chronic systolic congestive heart failure 11/08/2014  . Anemia of chronic disease 11/08/2014  . Dysphagia 11/08/2014  . Schizophrenia, unspecified type 11/08/2014  . Bacteremia due to Staphylococcus aureus   . Acute respiratory failure   .  Dehydration   . Community acquired pneumonia   . Anoxic brain injury   . Acute respiratory failure with hypoxia   . AKI (acute kidney injury)   . Septic shock 10/02/2014  . Encephalopathy acute 10/02/2014  . Altered mental status 10/01/2014  . Foreign body alimentary tract 10/01/2014  . Shock   . Cardiac arrest   . Foreign body ingestion   . Diabetes 05/22/2014  . DKA (diabetic ketoacidoses) 05/22/2014  . Constipation 05/22/2014  . Mood disorder 05/22/2014   Past Medical  History  Diagnosis Date  . Mixed stress and urge urinary incontinence   . Schizophrenia   . Allergy    History reviewed. No pertinent past surgical history. Allergies  Allergen Reactions  . Penicillins     Rash    Prior to Admission medications   Medication Sig Start Date End Date Taking? Authorizing Provider  ARIPiprazole (ABILIFY) 10 MG tablet Take 10 mg by mouth daily.   Yes Historical Provider, MD  atorvastatin (LIPITOR) 10 MG tablet Take 1 tablet (10 mg total) by mouth daily. 10/13/14  Yes Jeanella Craze, NP  benztropine (COGENTIN) 1 MG tablet Take 1 mg by mouth daily.   Yes Historical Provider, MD  buPROPion (WELLBUTRIN XL) 150 MG 24 hr tablet Take 150 mg by mouth daily.   Yes Historical Provider, MD  ferrous sulfate 325 (65 FE) MG tablet Take 325 mg by mouth daily with breakfast.   Yes Historical Provider, MD  metFORMIN (GLUCOPHAGE) 500 MG tablet Take 500 mg by mouth. 01/06/15 01/06/16 Yes Historical Provider, MD  mirtazapine (REMERON) 15 MG tablet Take 15 mg by mouth at bedtime.   Yes Historical Provider, MD  pantoprazole (PROTONIX) 40 MG tablet Take 40 mg by mouth daily.   Yes Historical Provider, MD  polyethylene glycol (MIRALAX / GLYCOLAX) packet Take 17 g by mouth daily.   Yes Historical Provider, MD  potassium chloride SA (K-DUR,KLOR-CON) 20 MEQ tablet Take 20 mEq by mouth daily.   Yes Historical Provider, MD  warfarin (COUMADIN) 5 MG tablet Take 5 mg by mouth daily.   Yes Historical Provider, MD   History   Social History  . Marital Status: Single    Spouse Name: N/A  . Number of Children: N/A  . Years of Education: N/A   Occupational History  . Not on file.   Social History Main Topics  . Smoking status: Former Smoker -- 3.00 packs/day    Types: Cigarettes  . Smokeless tobacco: Never Used  . Alcohol Use: 3.6 oz/week    6 Cans of beer per week  . Drug Use: Not on file  . Sexual Activity: Not on file   Other Topics Concern  . Not on file   Social History  Narrative    Review of Systems  Cardiovascular: Negative for leg swelling.     Objective:   BP 134/78 mmHg  Pulse 72  Temp(Src) 98.1 F (36.7 C) (Oral)  Resp 16  Ht 5' 5.25" (1.657 m)  Wt 141 lb 6 oz (64.127 kg)  BMI 23.36 kg/m2  SpO2 100%   Visual Acuity Screening   Right eye Left eye Both eyes  Without correction:     With correction:  Comments: Horizontal: Left 85; Right 85 Color 8/8  Hearing Screening Comments: Whisper Test:  Left 10; Right 10    Physical Exam  Constitutional: She is oriented to person, place, and time. She appears well-developed and well-nourished.  HENT:  Head: Normocephalic and  atraumatic.  Right Ear: External ear normal.  Left Ear: External ear normal.  Mouth/Throat: Oropharynx is clear and moist.  Eyes: Conjunctivae are normal. Pupils are equal, round, and reactive to light.  Neck: Normal range of motion. Neck supple. No thyromegaly present.  Cardiovascular: Normal rate, regular rhythm, normal heart sounds and intact distal pulses.   No murmur heard. Pulmonary/Chest: Effort normal and breath sounds normal. No respiratory distress. She has no wheezes.  Abdominal: Soft. Bowel sounds are normal. There is no tenderness.  Musculoskeletal: Normal range of motion. She exhibits no edema or tenderness.  Lymphadenopathy:    She has no cervical adenopathy.  Neurological: She is alert and oriented to person, place, and time.  Skin: Skin is warm and dry. No rash noted.  Psychiatric: She has a normal mood and affect. Her speech is normal and behavior is normal. She is not actively hallucinating.  Good eye contact.   Vitals reviewed.       Assessment & Plan:   Abigail RutherfordDeborah A Daniely is a 64 y.o. female Encounter for commercial driving license (CDL) exam Unfortunately she is not qualified for by DOT standards. Specifically chronic schizophrenia is disqualifying, and with recent stroke noted on CT when hospitalized - 5 year waiting  period due to cortical/subcortical location. Additionally had upper extremity DVT and now on coumadin, and as evidenced by 2 hospitalizations with DKA, has had uncontrolled diabetes, even though she reported that is no longer on insulin. See paperwork for further details. Understanding expressed of reasons above for inability to complete card at this time.    Meds ordered this encounter  Medications  . metFORMIN (GLUCOPHAGE) 500 MG tablet    Sig: Take 500 mg by mouth.   Patient Instructions  Unfortunately I am not able to provide DOT certification tonight based on a few conditions. Schizophrenia is disqualifying by [facility] standards, but you can call and discuss this further with the dept of transportation as you have been on medications for some time and stable recently.  Your diabetes control was unstable last year with 2 hospitalizations for diabetic ketoacidosis. If you require insulin, this is also disqualifying for DOT card.  You were also noted to have signs of a stroke on CT scan of your head on 10/05/14.  Based on the location of this stroke, the [facility] recommends a 5 year waiting period prior to driving.   If you or your other medical providers have any questions regarding this physical, please let me know.      I personally performed the services described in this documentation, which was scribed in my presence. The recorded information has been reviewed and considered, and addended by me as needed.

## 2015-01-10 ENCOUNTER — Telehealth: Payer: Self-pay

## 2015-01-10 NOTE — Telephone Encounter (Signed)
Dr. Paralee CancelGreene's note:  Unfortunately I am not able to provide DOT certification tonight based on a few conditions. Schizophrenia is disqualifying by [facility] standards, but you can call and discuss this further with the dept of transportation as you have been on medications for some time and stable recently.  Your diabetes control was unstable last year with 2 hospitalizations for diabetic ketoacidosis. If you require insulin, this is also disqualifying for DOT card.  You were also noted to have signs of a stroke on CT scan of your head on 10/05/14. Based on the location of this stroke, the [facility] recommends a 5 year waiting period prior to driving.   If you or your other medical providers have any questions regarding this physical, please let me know.

## 2015-01-10 NOTE — Telephone Encounter (Signed)
The prior stroke was unfortunately not only reason for disqualification. Chronic schizophrenia is disqualifying. I indicated on her AVS these reasons, and also her head CT showed a more recent infarct. Her recent head CT did show a new stroke: CT head from 10/05/14, with addendum report 10/11/14: Small cortical infarct in the parafalcine right occipital lobe, new from 10/01/2014, and probable small subcortical left occipital lobe infarct.  If any further clarification needed - let me know. Thanks. -JG.

## 2015-01-10 NOTE — Telephone Encounter (Signed)
Pt called in regards to wife being denied a DOT pe due to a stroke that she has had in the past. Pt's husband states that this was out of the 1-5 year range, and would like to know if there is a Dr. Here who would be able to evaluate for clearance. Please advise

## 2015-01-12 NOTE — Telephone Encounter (Signed)
Left message for pt to call back  °

## 2015-01-26 ENCOUNTER — Telehealth: Payer: Self-pay | Admitting: Family Medicine

## 2015-01-26 NOTE — Telephone Encounter (Addendum)
No phone call.  Patient in office to discuss last visit and DOT certification.  Placed in Room 9, and scribe present for info below, as well as my assistant, Callie Fransen.  She her last appointment, she saw her ophthalmologist who told her she has 20/20 vision and met DOT standards.  She was told by [place] DOT that given her history of strokes, it is up to the physician to determine when she will be able to drive again.  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  She is no longer taking Insulin.  She has an appointment with her psychiatrist on Monday.  Lengthy discussion had with patient regarding DOT standards as they pertain to patients with a history of schizophrenia.  Explained to the patient that under no circumstance will she be approved to receive a DOT card if she has a history of chronic schizophrenia.  Also explained in length the DOT recommendations for patients with a history of stroke and the risk of seizure, as well as waiting periods as discussed at prior ov. Copies form DOT book given on topics above. Advised to let me know if any other questions and I will be happy to provide any clarification if needed.   

## 2015-02-27 IMAGING — CR DG CHEST 1V PORT
1 series · 1 of 1 positions shown · non-contrast
Comparison: Portable chest x-ray October 23, 2014 and October 14, 2014 and chest CT scanning October 06, 2014.

CLINICAL DATA: Shortness of breath and cough.

EXAM:
PORTABLE CHEST - 1 VIEW

[AP]
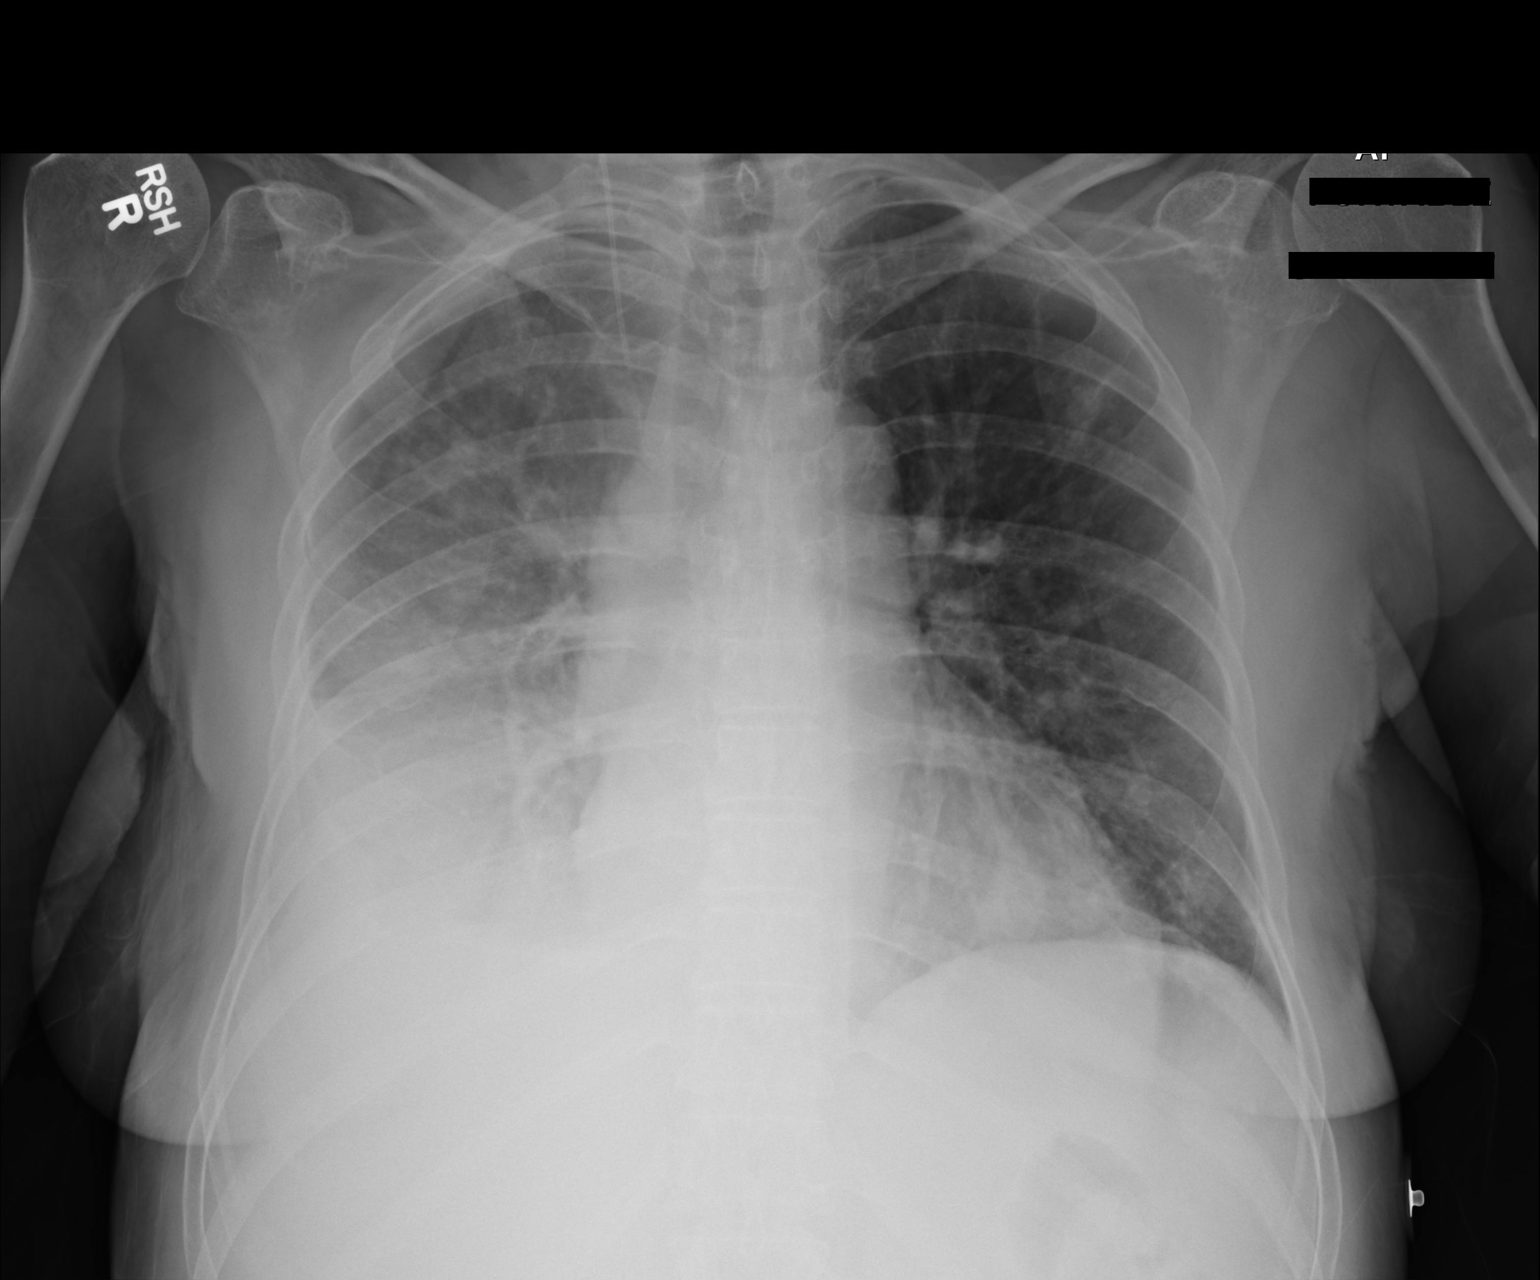

[1 of 1 positions shown; findings below may reference images not displayed]

FINDINGS: There has developed further increased density at the right lung
base. The hemidiaphragm is nearly totally obscured now. On the left
increasing density in the retrocardiac region is noted though the
hemidiaphragm remain sharp. There is no significant pleural
effusion. There is persistent nodular density in the left upper
lobe. The heart and pulmonary vascularity are normal. A right
internal jugular venous catheter tip projects over the proximal
third of the SVC.
IMPRESSION: Increasing density at the right lung base is consistent with
worsening atelectasis or pneumonia with an accompanying pleural
effusion. There is increased density at the left lung base as well
consistent with subsegmental atelectasis. There is stable nodularity
in the left upper lobe.

## 2015-03-10 ENCOUNTER — Other Ambulatory Visit: Payer: Self-pay | Admitting: Nurse Practitioner

## 2015-07-19 ENCOUNTER — Other Ambulatory Visit (HOSPITAL_COMMUNITY): Payer: Self-pay | Admitting: Physician Assistant

## 2015-07-19 DIAGNOSIS — Z1231 Encounter for screening mammogram for malignant neoplasm of breast: Secondary | ICD-10-CM

## 2015-07-22 ENCOUNTER — Ambulatory Visit (HOSPITAL_COMMUNITY)
Admission: RE | Admit: 2015-07-22 | Discharge: 2015-07-22 | Disposition: A | Discharge status: Home / Self Care | Payer: Medicare Other | Source: Ambulatory Visit | Attending: Physician Assistant | Admitting: Physician Assistant

## 2015-07-22 ENCOUNTER — Ambulatory Visit (HOSPITAL_COMMUNITY): Payer: Medicare Other

## 2015-07-22 DIAGNOSIS — Z1231 Encounter for screening mammogram for malignant neoplasm of breast: Secondary | ICD-10-CM

## 2016-09-24 ENCOUNTER — Emergency Department (HOSPITAL_COMMUNITY): Payer: Medicare Other

## 2016-09-24 ENCOUNTER — Inpatient Hospital Stay (HOSPITAL_COMMUNITY)
Admission: EM | Admit: 2016-09-24 | Discharge: 2016-09-27 | DRG: 637 | Disposition: A | Discharge status: Skilled Nursing Facility | Payer: Medicare Other | Attending: Family Medicine | Admitting: Family Medicine

## 2016-09-24 ENCOUNTER — Encounter (HOSPITAL_COMMUNITY): Payer: Self-pay

## 2016-09-24 DIAGNOSIS — Z9114 Patient's other noncompliance with medication regimen: Secondary | ICD-10-CM

## 2016-09-24 DIAGNOSIS — D638 Anemia in other chronic diseases classified elsewhere: Secondary | ICD-10-CM | POA: Diagnosis present

## 2016-09-24 DIAGNOSIS — E785 Hyperlipidemia, unspecified: Secondary | ICD-10-CM | POA: Diagnosis present

## 2016-09-24 DIAGNOSIS — D696 Thrombocytopenia, unspecified: Secondary | ICD-10-CM | POA: Diagnosis present

## 2016-09-24 DIAGNOSIS — Z22322 Carrier or suspected carrier of Methicillin resistant Staphylococcus aureus: Secondary | ICD-10-CM

## 2016-09-24 DIAGNOSIS — E1011 Type 1 diabetes mellitus with ketoacidosis with coma: Secondary | ICD-10-CM

## 2016-09-24 DIAGNOSIS — E872 Acidosis, unspecified: Secondary | ICD-10-CM | POA: Diagnosis present

## 2016-09-24 DIAGNOSIS — R680 Hypothermia, not associated with low environmental temperature: Secondary | ICD-10-CM | POA: Diagnosis present

## 2016-09-24 DIAGNOSIS — Z8674 Personal history of sudden cardiac arrest: Secondary | ICD-10-CM | POA: Diagnosis not present

## 2016-09-24 DIAGNOSIS — M6281 Muscle weakness (generalized): Secondary | ICD-10-CM

## 2016-09-24 DIAGNOSIS — G9341 Metabolic encephalopathy: Secondary | ICD-10-CM | POA: Diagnosis present

## 2016-09-24 DIAGNOSIS — I110 Hypertensive heart disease with heart failure: Secondary | ICD-10-CM | POA: Diagnosis present

## 2016-09-24 DIAGNOSIS — Y92002 Bathroom of unspecified non-institutional (private) residence single-family (private) house as the place of occurrence of the external cause: Secondary | ICD-10-CM

## 2016-09-24 DIAGNOSIS — Z880 Allergy status to penicillin: Secondary | ICD-10-CM

## 2016-09-24 DIAGNOSIS — Z794 Long term (current) use of insulin: Secondary | ICD-10-CM | POA: Diagnosis not present

## 2016-09-24 DIAGNOSIS — G9340 Encephalopathy, unspecified: Secondary | ICD-10-CM | POA: Diagnosis not present

## 2016-09-24 DIAGNOSIS — F209 Schizophrenia, unspecified: Secondary | ICD-10-CM | POA: Diagnosis present

## 2016-09-24 DIAGNOSIS — E876 Hypokalemia: Secondary | ICD-10-CM | POA: Diagnosis not present

## 2016-09-24 DIAGNOSIS — Z833 Family history of diabetes mellitus: Secondary | ICD-10-CM | POA: Diagnosis not present

## 2016-09-24 DIAGNOSIS — E1110 Type 2 diabetes mellitus with ketoacidosis without coma: Secondary | ICD-10-CM | POA: Diagnosis present

## 2016-09-24 DIAGNOSIS — Z8249 Family history of ischemic heart disease and other diseases of the circulatory system: Secondary | ICD-10-CM | POA: Diagnosis not present

## 2016-09-24 DIAGNOSIS — Z87891 Personal history of nicotine dependence: Secondary | ICD-10-CM

## 2016-09-24 DIAGNOSIS — I959 Hypotension, unspecified: Secondary | ICD-10-CM | POA: Diagnosis not present

## 2016-09-24 DIAGNOSIS — F259 Schizoaffective disorder, unspecified: Secondary | ICD-10-CM | POA: Diagnosis present

## 2016-09-24 DIAGNOSIS — E860 Dehydration: Secondary | ICD-10-CM | POA: Diagnosis present

## 2016-09-24 DIAGNOSIS — R4182 Altered mental status, unspecified: Secondary | ICD-10-CM | POA: Diagnosis present

## 2016-09-24 DIAGNOSIS — Z79899 Other long term (current) drug therapy: Secondary | ICD-10-CM

## 2016-09-24 DIAGNOSIS — W19XXXA Unspecified fall, initial encounter: Secondary | ICD-10-CM | POA: Diagnosis present

## 2016-09-24 DIAGNOSIS — E875 Hyperkalemia: Secondary | ICD-10-CM | POA: Diagnosis present

## 2016-09-24 DIAGNOSIS — E871 Hypo-osmolality and hyponatremia: Secondary | ICD-10-CM | POA: Diagnosis present

## 2016-09-24 DIAGNOSIS — I5022 Chronic systolic (congestive) heart failure: Secondary | ICD-10-CM | POA: Diagnosis present

## 2016-09-24 DIAGNOSIS — E1311 Other specified diabetes mellitus with ketoacidosis with coma: Secondary | ICD-10-CM | POA: Diagnosis not present

## 2016-09-24 DIAGNOSIS — L8990 Pressure ulcer of unspecified site, unspecified stage: Secondary | ICD-10-CM | POA: Insufficient documentation

## 2016-09-24 DIAGNOSIS — N179 Acute kidney failure, unspecified: Secondary | ICD-10-CM | POA: Diagnosis present

## 2016-09-24 DIAGNOSIS — E1310 Other specified diabetes mellitus with ketoacidosis without coma: Secondary | ICD-10-CM | POA: Diagnosis not present

## 2016-09-24 HISTORY — DX: Type 2 diabetes mellitus without complications: E11.9

## 2016-09-24 LAB — CBC
HEMATOCRIT: 47.8 % — AB (ref 36.0–46.0)
HEMOGLOBIN: 14 g/dL (ref 12.0–15.0)
MCH: 28.6 pg (ref 26.0–34.0)
MCHC: 29.3 g/dL — AB (ref 30.0–36.0)
MCV: 97.8 fL (ref 78.0–100.0)
Platelets: 290 10*3/uL (ref 150–400)
RBC: 4.89 MIL/uL (ref 3.87–5.11)
RDW: 14.7 % (ref 11.5–15.5)
WBC: 9.5 10*3/uL (ref 4.0–10.5)

## 2016-09-24 LAB — BASIC METABOLIC PANEL
Anion gap: 21 — ABNORMAL HIGH (ref 5–15)
BUN: 58 mg/dL — ABNORMAL HIGH (ref 6–20)
BUN: 57 mg/dL — AB (ref 6–20)
BUN: 55 mg/dL — ABNORMAL HIGH (ref 6–20)
CALCIUM: 7.5 mg/dL — AB (ref 8.9–10.3)
CHLORIDE: 102 mmol/L (ref 101–111)
CO2: 7 mmol/L — ABNORMAL LOW (ref 22–32)
CREATININE: 2.19 mg/dL — AB (ref 0.44–1.00)
Calcium: 8.7 mg/dL — ABNORMAL LOW (ref 8.9–10.3)
Calcium: 8.4 mg/dL — ABNORMAL LOW (ref 8.9–10.3)
Chloride: 97 mmol/L — ABNORMAL LOW (ref 101–111)
Chloride: 113 mmol/L — ABNORMAL HIGH (ref 101–111)
Creatinine, Ser: 2.51 mg/dL — ABNORMAL HIGH (ref 0.44–1.00)
Creatinine, Ser: 2.35 mg/dL — ABNORMAL HIGH (ref 0.44–1.00)
GFR calc Af Amer: 24 mL/min — ABNORMAL LOW (ref >60)
GFR calc non Af Amer: 21 mL/min — ABNORMAL LOW (ref >60)
GFR calc non Af Amer: 23 mL/min — ABNORMAL LOW (ref >60)
GFR, EST AFRICAN AMERICAN: 22 mL/min — AB (ref >60)
GFR, EST AFRICAN AMERICAN: 26 mL/min — AB (ref >60)
GFR, EST NON AFRICAN AMERICAN: 19 mL/min — AB (ref >60)
GLUCOSE: 1016 mg/dL — AB (ref 65–99)
GLUCOSE: 936 mg/dL — AB (ref 65–99)
Glucose, Bld: 539 mg/dL (ref 65–99)
POTASSIUM: 7.2 mmol/L — AB (ref 3.5–5.1)
POTASSIUM: 7.3 mmol/L — AB (ref 3.5–5.1)
Potassium: 3.9 mmol/L (ref 3.5–5.1)
SODIUM: 129 mmol/L — AB (ref 135–145)
SODIUM: 141 mmol/L (ref 135–145)
Sodium: 131 mmol/L — ABNORMAL LOW (ref 135–145)

## 2016-09-24 LAB — CBG MONITORING, ED
GLUCOSE-CAPILLARY: 563 mg/dL — AB (ref 65–99)
GLUCOSE-CAPILLARY: 417 mg/dL — AB (ref 65–99)
Glucose-Capillary: >600 mg/dL (ref 65–99)
Glucose-Capillary: 485 mg/dL — ABNORMAL HIGH (ref 65–99)

## 2016-09-24 LAB — URINALYSIS, ROUTINE W REFLEX MICROSCOPIC
BILIRUBIN URINE: NEGATIVE
Glucose, UA: >1000 mg/dL — AB
Ketones, ur: 40 mg/dL — AB
Leukocytes, UA: NEGATIVE
NITRITE: POSITIVE — AB
PH: 5.5 (ref 5.0–8.0)
Protein, ur: 100 mg/dL — AB
SPECIFIC GRAVITY, URINE: 1.025 (ref 1.005–1.030)

## 2016-09-24 LAB — BLOOD GAS, ARTERIAL
DRAWN BY: 23534
Drawn by: 21310
O2 CONTENT: 2 L/min
O2 Content: 2 L/min
O2 Content: 4 L/min
O2 Saturation: 96.3 %
O2 Saturation: 98 %
PH ART: 6.817 — AB (ref 7.350–7.450)
PH ART: 7.198 — AB (ref 7.350–7.450)
Patient temperature: 36.7
pO2, Arterial: 160 mmHg — ABNORMAL HIGH (ref 83.0–108.0)
pO2, Arterial: 164 mmHg — ABNORMAL HIGH (ref 83.0–108.0)
pO2, Arterial: 128 mmHg — ABNORMAL HIGH (ref 83.0–108.0)

## 2016-09-24 LAB — CREATININE, URINE, RANDOM: Creatinine, Urine: 25.72 mg/dL

## 2016-09-24 LAB — I-STAT CG4 LACTIC ACID, ED: LACTIC ACID, VENOUS: 2.85 mmol/L — AB (ref 0.5–1.9)

## 2016-09-24 LAB — URINE MICROSCOPIC-ADD ON

## 2016-09-24 LAB — PROTIME-INR
INR: 1.24
PROTHROMBIN TIME: 15.7 s — AB (ref 11.4–15.2)

## 2016-09-24 LAB — SODIUM, URINE, RANDOM: Sodium, Ur: 23 mmol/L

## 2016-09-24 LAB — MAGNESIUM: MAGNESIUM: 2.3 mg/dL (ref 1.7–2.4)

## 2016-09-24 LAB — GLUCOSE, CAPILLARY
Glucose-Capillary: 298 mg/dL — ABNORMAL HIGH (ref 65–99)
Glucose-Capillary: 384 mg/dL — ABNORMAL HIGH (ref 65–99)

## 2016-09-24 LAB — LACTIC ACID, PLASMA: Lactic Acid, Venous: 2.5 mmol/L (ref 0.5–1.9)

## 2016-09-24 LAB — RAPID URINE DRUG SCREEN, HOSP PERFORMED
AMPHETAMINES: NOT DETECTED
BARBITURATES: NOT DETECTED
BENZODIAZEPINES: NOT DETECTED
COCAINE: NOT DETECTED
Opiates: NOT DETECTED
TETRAHYDROCANNABINOL: NOT DETECTED

## 2016-09-24 MED ORDER — BUPROPION HCL 75 MG PO TABS
75.0000 mg | ORAL_TABLET | Freq: Two times a day (BID) | ORAL | Status: DC
  Administered 2016-09-25 – 2016-09-27 (×5): 75 mg via ORAL
  Filled 2016-09-24 (×7): qty 1

## 2016-09-24 MED ORDER — STERILE WATER FOR INJECTION IV SOLN
Freq: Once | INTRAVENOUS | Status: AC
  Administered 2016-09-24: 16:00:00 via INTRAVENOUS
  Filled 2016-09-24: qty 850

## 2016-09-24 MED ORDER — DEXTROSE-NACL 5-0.45 % IV SOLN: INTRAVENOUS | Status: DC

## 2016-09-24 MED ORDER — INSULIN REGULAR HUMAN 100 UNIT/ML IJ SOLN
INTRAMUSCULAR | Status: DC
  Administered 2016-09-24: 5.4 [IU]/h via INTRAVENOUS
  Filled 2016-09-24: qty 2.5

## 2016-09-24 MED ORDER — HEPARIN SODIUM (PORCINE) 5000 UNIT/ML IJ SOLN
5000.0000 [IU] | Freq: Three times a day (TID) | INTRAMUSCULAR | Status: DC
  Administered 2016-09-24 – 2016-09-25 (×4): 5000 [IU] via SUBCUTANEOUS
  Filled 2016-09-24 (×4): qty 1

## 2016-09-24 MED ORDER — SODIUM CHLORIDE 0.9 % IV BOLUS (SEPSIS)
1000.0000 mL | Freq: Once | INTRAVENOUS | Status: AC
  Administered 2016-09-24: 1000 mL via INTRAVENOUS

## 2016-09-24 MED ORDER — ARIPIPRAZOLE 5 MG PO TABS
15.0000 mg | ORAL_TABLET | Freq: Every day | ORAL | Status: DC
  Administered 2016-09-25 – 2016-09-27 (×3): 15 mg via ORAL
  Filled 2016-09-24: qty 3
  Filled 2016-09-24: qty 1
  Filled 2016-09-24: qty 3
  Filled 2016-09-24: qty 1

## 2016-09-24 MED ORDER — BENZTROPINE MESYLATE 1 MG PO TABS
1.0000 mg | ORAL_TABLET | Freq: Every day | ORAL | Status: DC
  Administered 2016-09-25 – 2016-09-26 (×2): 1 mg via ORAL
  Filled 2016-09-24 (×2): qty 1

## 2016-09-24 MED ORDER — ALBUTEROL SULFATE (2.5 MG/3ML) 0.083% IN NEBU
10.0000 mg | INHALATION_SOLUTION | Freq: Once | RESPIRATORY_TRACT | Status: DC
  Filled 2016-09-24: qty 12

## 2016-09-24 MED ORDER — SODIUM BICARBONATE 8.4 % IV SOLN
Freq: Once | INTRAVENOUS | Status: DC
  Filled 2016-09-24: qty 100

## 2016-09-24 MED ORDER — MIRTAZAPINE 15 MG PO TABS
15.0000 mg | ORAL_TABLET | Freq: Every day | ORAL | Status: DC
  Administered 2016-09-25 – 2016-09-26 (×2): 15 mg via ORAL
  Filled 2016-09-24 (×3): qty 1

## 2016-09-24 MED ORDER — INSULIN REGULAR NICU BOLUS VIA INFUSION: 10.0000 [IU] | Freq: Once | INTRAVENOUS | Status: DC

## 2016-09-24 MED ORDER — DEXTROSE-NACL 5-0.45 % IV SOLN
INTRAVENOUS | Status: DC
  Administered 2016-09-25: 01:00:00 via INTRAVENOUS

## 2016-09-24 MED ORDER — ATORVASTATIN CALCIUM 10 MG PO TABS
10.0000 mg | ORAL_TABLET | Freq: Every day | ORAL | Status: DC
  Administered 2016-09-25 – 2016-09-27 (×3): 10 mg via ORAL
  Filled 2016-09-24 (×3): qty 1

## 2016-09-24 MED ORDER — CALCIUM GLUCONATE 10 % IV SOLN
1.0000 g | Freq: Once | INTRAVENOUS | Status: AC
  Administered 2016-09-24: 1 g via INTRAVENOUS
  Filled 2016-09-24: qty 10

## 2016-09-24 MED ORDER — SODIUM CHLORIDE 0.9 % IV SOLN
1.0000 g | Freq: Once | INTRAVENOUS | Status: DC
  Filled 2016-09-24: qty 10

## 2016-09-24 MED ORDER — MIRABEGRON ER 25 MG PO TB24
50.0000 mg | ORAL_TABLET | Freq: Every day | ORAL | Status: DC
  Administered 2016-09-26 – 2016-09-27 (×2): 50 mg via ORAL
  Filled 2016-09-24: qty 2
  Filled 2016-09-24 (×2): qty 1
  Filled 2016-09-24 (×2): qty 2

## 2016-09-24 MED ORDER — SODIUM CHLORIDE 0.9 % IV SOLN
INTRAVENOUS | Status: DC
  Administered 2016-09-24: 23:00:00 via INTRAVENOUS

## 2016-09-24 MED ORDER — SODIUM CHLORIDE 0.9 % IV SOLN
INTRAVENOUS | Status: DC
  Filled 2016-09-24: qty 2.5

## 2016-09-24 MED ORDER — FERROUS SULFATE 325 (65 FE) MG PO TABS
325.0000 mg | ORAL_TABLET | Freq: Every day | ORAL | Status: DC
  Administered 2016-09-25 – 2016-09-27 (×3): 325 mg via ORAL
  Filled 2016-09-24 (×3): qty 1

## 2016-09-24 MED ORDER — SODIUM BICARBONATE 8.4 % IV SOLN
100.0000 meq | Freq: Once | INTRAVENOUS | Status: AC
  Administered 2016-09-24: 100 meq via INTRAVENOUS
  Filled 2016-09-24: qty 100

## 2016-09-24 MED ORDER — SODIUM CHLORIDE 0.9 % IV SOLN
INTRAVENOUS | Status: DC
  Administered 2016-09-24 – 2016-09-25 (×2): via INTRAVENOUS
  Filled 2016-09-24: qty 2.5

## 2016-09-24 MED ORDER — SODIUM CHLORIDE 0.9 % IV BOLUS (SEPSIS)
2000.0000 mL | Freq: Once | INTRAVENOUS | Status: AC
  Administered 2016-09-24: 2000 mL via INTRAVENOUS

## 2016-09-24 MED ORDER — INSULIN ASPART 100 UNIT/ML ~~LOC~~ SOLN
10.0000 [IU] | Freq: Once | SUBCUTANEOUS | Status: AC
Start: 2016-09-24 — End: 2016-09-24
  Administered 2016-09-24: 10 [IU] via INTRAVENOUS
  Filled 2016-09-24: qty 1

## 2016-09-24 MED ORDER — INSULIN ASPART 100 UNIT/ML ~~LOC~~ SOLN: 0.0000 [IU] | SUBCUTANEOUS | Status: DC

## 2016-09-24 MED ORDER — INSULIN DETEMIR 100 UNIT/ML ~~LOC~~ SOLN
4.0000 [IU] | Freq: Once | SUBCUTANEOUS | Status: DC
  Filled 2016-09-24: qty 0.04

## 2016-09-24 NOTE — H&P (Signed)
History and Physical    [name] TZG:[id number] DOB: [birth year] DOA: 09/24/2016  PCP: Tereasa Coop, PA-C   Patient coming from: Home  Chief Complaint: High CBG, poorly responsive   HPI: [name] is a 65 y.o. female with medical history significant for schizophrenia, poor compliance with her medications, and type 2 diabetes mellitus who presents the emergency department after being found poorly responsive and with "high" CBG reading by her husband. Patient had reportedly been in her usual state of health until developing a generalized weakness and lethargy over the past couple days. She reportedly fell in the bathroom last night and then laid on the floor there until this morning. Her husband noted CBG reading to be "high," so he reportedly administered an unknown amount of insulin and later called EMS. By time of EMS arrival, CBG was reportedly 600 and patient was very poorly responsive. She was brought into the ED for further evaluation. Per review of the EMR, the patient hasn't seen her PCP yesterday complaining of dysuria but there was no indication of infection and she was given Pyridium for symptomatic care. A UA was obtained during that visit yesterday and is notable for undetectable high urine ketones and marked glucosuria. She was tachycardic to 113 when she arrived for that visit.  ED Course: Upon arrival to the ED, patient is found to be poorly responsive, hypothermic to 32.3 C, saturating adequately on 2 L/m supplemental oxygen, hypotensive to 66/38, and with normal heart rate and respirations. EKG feature to sinus rhythm with nonspecific interventricular conduction delay and peaked T waves. Chest x-ray was notable for mild interstitial prominence suggestive of possible low-grade interstitial edema. Chemistry panel was obtained and features widespread derangements including sodium 129, potassium 7.2, undetectable a low serum bicarbonate, creatinine of 2.51, up from a  baseline of 0.6, and serum glucose of 1016. CBC is largely unremarkable, lactic acid is elevated to 2.85, urinalysis is not suggestive of infection, and pH on ABG is uncomfortable. Patient was treated emergently with nebulized albuterol, calcium, insulin infusion, serum bicarbonate, and normal saline boluses. Blood and urine cultures were obtained. 1-1/2 hours later the pH was 6.82, patient was continued on a bicarbonate infusion and insulin infusions and pH eventually improved to 7.20 after another 2-1/2 hours. Intensivist was consulted by the ED physician and advised that given the rapid improvement in the patient's condition and she can be admitted here at [facility] for ongoing evaluation and management. Head CT was obtained given the report of a fall last night and patient's poorly responsive state. This was negative for acute intracranial abnormality. Patient will be admitted to the ICU for ongoing evaluation and management of severe DKA in the setting of type 2 diabetes mellitus with history of poor adherence to her medications. No infectious or other etiology has been identified as a precipitant for the DKA.  Review of Systems:  Unable to obtain ROS secondary to the patient's clinical condition.  Past Medical History:  Diagnosis Date  . Allergy   . Diabetes mellitus without complication ([place])   . Mixed stress and urge urinary incontinence   . Schizophrenia ([place])     History reviewed. No pertinent surgical history.   reports that she has quit smoking. Her smoking use included Cigarettes. She smoked 3.00 packs per day. She has never used smokeless tobacco. She reports that she drinks about 3.6 oz of alcohol per week . Her drug history is not on file.  Allergies  Allergen Reactions  .  Penicillins     Rash     Family History  Problem Relation Age of Onset  . Heart disease Mother   . Hypertension Father   . Stroke Father   . Heart disease Father   . Diabetes Father   .  Emphysema Paternal Grandfather      Prior to Admission medications   Medication Sig Start Date End Date Taking? Authorizing Provider  ARIPiprazole (ABILIFY) 15 MG tablet Take 15 mg by mouth daily.   Yes Historical Provider, MD  atorvastatin (LIPITOR) 10 MG tablet Take 1 tablet (10 mg total) by mouth daily. 10/13/14  Yes Donita Brooks, NP  benztropine (COGENTIN) 1 MG tablet Take 1 mg by mouth at bedtime.    Yes Historical Provider, MD  buPROPion (WELLBUTRIN) 75 MG tablet Take 75 mg by mouth 2 (two) times daily.   Yes Historical Provider, MD  clotrimazole (LOTRIMIN) 1 % cream Apply 1 application topically 2 (two) times daily.   Yes Historical Provider, MD  ferrous sulfate 325 (65 FE) MG tablet Take 325 mg by mouth daily with breakfast.   Yes Historical Provider, MD  insulin detemir (LEVEMIR) 100 UNIT/ML injection Inject 40 Units into the skin at bedtime.   Yes Historical Provider, MD  Insulin Lispro (HUMALOG KWIKPEN) 200 UNIT/ML SOPN Inject 20 Units into the skin 2 (two) times daily with a meal.   Yes Historical Provider, MD  lisinopril (PRINIVIL,ZESTRIL) 5 MG tablet Take 5 mg by mouth daily.   Yes Historical Provider, MD  metFORMIN (GLUCOPHAGE) 1000 MG tablet Take 1,000 mg by mouth 2 (two) times daily with a meal.   Yes Historical Provider, MD  mirabegron ER (MYRBETRIQ) 50 MG TB24 tablet Take 50 mg by mouth daily.   Yes Historical Provider, MD  mirtazapine (REMERON) 15 MG tablet Take 15 mg by mouth at bedtime.   Yes Historical Provider, MD  phenazopyridine (PYRIDIUM) 200 MG tablet Take 200 mg by mouth 3 (three) times daily as needed for pain.   Yes Historical Provider, MD    Physical Exam: Vitals:   09/24/16 2015 09/24/16 2020 09/24/16 2025 09/24/16 2030  BP: 90/55 96/57 95/59 95/56  Pulse: 118 114 114 114  Resp: (!) 29 (!) 28 (!) 30 (!) 32  Temp: 98.1 F (36.7 C) 98.2 F (36.8 C) 98.2 F (36.8 C) 98.4 F (36.9 C)  TempSrc:      SpO2: 93% 93% 90% 100%  Weight:      Height:           Constitutional: Obtunded, tachypneic, no pallor or cyanosis  Eyes: PERTLA, lids and conjunctivae normal ENMT: Mucous membranes are dry. Posterior pharynx clear of any exudate or lesions.   Neck: normal, supple, no masses, no thyromegaly Respiratory: clear to auscultation bilaterally, no wheezing, no crackles. Tachypnea.  Cardiovascular: Rate ~110 and regular. No extremity edema. No significant JVD. Abdomen: No distension, no tenderness, no masses palpated. Bowel sounds normal.  Musculoskeletal: no clubbing / cyanosis. No joint deformity upper and lower extremities. Normal muscle tone.  Skin: no significant rashes, lesions, ulcers. Poor turgor. Neurologic: No gross facial asymmetry, moving all extremities spontaneously, patellar DTR normal. Makes eye contact. No verbal response.   Psychiatric: Unable to assess on admission d/t the clinical scenario.     Labs on Admission: I have personally reviewed following labs and imaging studies  CBC:  Recent Labs Lab 09/24/16 1411  WBC 9.5  HGB 14.0  HCT 47.8*  MCV 97.8  PLT 290   Basic  Metabolic Panel:  Recent Labs Lab 09/24/16 1411 09/24/16 1545  NA 129* 131*  K 7.2* 7.3*  CL 97* 102  CO2 <7* <7*  GLUCOSE 1,016* 936*  BUN 58* 57*  CREATININE 2.51* 2.35*  CALCIUM 8.7* 8.4*   GFR: Estimated Creatinine Clearance: 19.3 mL/min (by C-G formula based on SCr of 2.35 mg/dL (H)). Liver Function Tests: No results for input(s): AST, ALT, ALKPHOS, BILITOT, PROT, ALBUMIN in the last 168 hours. No results for input(s): LIPASE, AMYLASE in the last 168 hours. No results for input(s): AMMONIA in the last 168 hours. Coagulation Profile:  Recent Labs Lab 09/24/16 1420  INR 1.24   Cardiac Enzymes: No results for input(s): CKTOTAL, CKMB, CKMBINDEX, TROPONINI in the last 168 hours. BNP (last 3 results) No results for input(s): PROBNP in the last 8760 hours. HbA1C: No results for input(s): HGBA1C in the last 72  hours. CBG:  Recent Labs Lab 09/24/16 1417 09/24/16 1724 09/24/16 1832 09/24/16 1935 09/24/16 2040  GLUCAP >600* >600* >600* 563* 485*   Lipid Profile: No results for input(s): CHOL, HDL, LDLCALC, TRIG, CHOLHDL, LDLDIRECT in the last 72 hours. Thyroid Function Tests: No results for input(s): TSH, T4TOTAL, FREET4, T3FREE, THYROIDAB in the last 72 hours. Anemia Panel: No results for input(s): VITAMINB12, FOLATE, FERRITIN, TIBC, IRON, RETICCTPCT in the last 72 hours. Urine analysis:    Component Value Date/Time   COLORURINE ORANGE (A) 09/24/2016 1411   APPEARANCEUR CLEAR 09/24/2016 1411   LABSPEC 1.025 09/24/2016 1411   PHURINE 5.5 09/24/2016 1411   GLUCOSEU >1000 (A) 09/24/2016 1411   HGBUR SMALL (A) 09/24/2016 1411   BILIRUBINUR NEGATIVE 09/24/2016 1411   BILIRUBINUR neg 05/22/2014 0935   KETONESUR 40 (A) 09/24/2016 1411   PROTEINUR 100 (A) 09/24/2016 1411   UROBILINOGEN 0.2 10/14/2014 0545   NITRITE POSITIVE (A) 09/24/2016 1411   LEUKOCYTESUR NEGATIVE 09/24/2016 1411   Sepsis Labs: _0 (procalcitonin:4,lacticidven:4) ) Recent Results (from the past 240 hour(s))  Culture, blood (Routine x 2)     Status: None (Preliminary result)   Collection Time: 09/24/16  3:41 PM  Result Value Ref Range Status   Specimen Description BLOOD BLOOD RIGHT ARM  Final   Special Requests BOTTLES DRAWN AEROBIC AND ANAEROBIC 6 CC EACH  Final   Culture PENDING  Incomplete   Report Status PENDING  Incomplete  Culture, blood (Routine x 2)     Status: None (Preliminary result)   Collection Time: 09/24/16  3:46 PM  Result Value Ref Range Status   Specimen Description BLOOD BLOOD RIGHT HAND  Final   Special Requests BOTTLES DRAWN AEROBIC AND ANAEROBIC 6 CC EACH  Final   Culture PENDING  Incomplete   Report Status PENDING  Incomplete     Radiological Exams on Admission: Ct Head Wo Contrast  Result Date: 09/24/2016 CLINICAL DATA:  Altered mental status and hyperglycemia. EXAM: CT HEAD  WITHOUT CONTRAST TECHNIQUE: Contiguous axial images were obtained from the base of the skull through the vertex without intravenous contrast. 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 No evidence for acute hemorrhage, mass lesion, midline shift, hydrocephalus or large new infarct. Vascular: No hyperdense vessel or unexpected calcification. Skull: Normal. Negative for fracture or focal lesion. Sinuses/Orbits: Small amount of fluid or mucosal thickening in the maxillary sinuses. Evidence for a previous right maxillary antrostomy. Diffuse mucosal disease in the ethmoid air cells. Other: None. IMPRESSION: No acute intracranial abnormality. 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 Paranasal sinus disease.  Electronically Signed   By: Markus Daft M.D.   On: 09/24/2016 19:54   Dg Chest Portable 1 View  Result Date: 09/24/2016 CLINICAL DATA:  Altered mental status, hyperglycemia. History of diabetes, schizophrenia. EXAM: PORTABLE CHEST 1 VIEW COMPARISON:  Portable chest x-ray of November 01, 2014 FINDINGS: The lungs are well-expanded. The interstitial markings are coarse. There is no alveolar infiltrate. There is no pleural effusion. The heart is top-normal in size. The pulmonary vascularity is prominent centrally. There is calcification in the wall of the aortic arch. The bony thorax exhibits no acute abnormality. IMPRESSION: There is mild interstitial prominence of both lungs more conspicuous than on previous studies. This may reflect low-grade interstitial edema. An area of confluent density in the right mid lung seen previously is no longer evident. Aortic atherosclerosis. Electronically Signed   By: David  Martinique M.D.   On: 09/24/2016 15:12    EKG: Independently reviewed. Sinus rhythm, non-specific IVCD, peaked T-waves  Assessment/Plan  1. DKA in type 2 DM, severe  - Presents with serum glucose 1000, pH undetectably low, and urine ketones  -  Suspect this is secondary to poor adherence with her medications; no infection identified, will follow cultures; no ischemic changes noted on telemetry; head CT neg  - She was fluid-resuscitated with 5 L NS in ED, given IV bicarb and started on bicarbonate infusion, and treated with insulin infusion  - She has made steady improvement in the ED and intensivist advised that she could be admitted here to [facility]  - Continue insulin infusion until parameters met for transition to sq  - Continue bicarbonate infusion until pH 7.2 or better  - Continue serial chem panels until DKA resolved  - Monitor on continuous telemetry in ICU tonight   2. Hyperkalemia  - Potassium elevated to 7.2 in the setting of severe DKA with AKI; peaked T-waves noted on EKG - She was treated in the ED with albuterol, calcium, bicarbonate, IVF, and insulin  - Following serial chem panels and monitoring on telemetry   3. Metabolic acidosis  - Severe acidosis secondary to DKA - Initially pH was undetectably low, improved to 7.198 with fluid-resucitation, insulin and bicarbonate infusions - Repeat ABG and discontinue bicarb gtt if 7.2 or better   4. AKI  - SCr 2.51 on admission, up from 0.62 in September 2017  - Secondary to severe DKA with profound intravascular volume depletion  - She has been fluid-resuscitated in ED - Following serial chem panels as above  - Hold lisinopril until renal fxn stabilizes    5. Hyponatremia  - Serum sodium 129 in setting of DKA with dehydration and serum glucose 1000  - Anticipate this will normalize with fluid-resuscitation and correction of hyperglycemia with insulin  - Following serial chem panels as above    6. Schizophrenia  - Unable to assess on admission given the clinical scenario  - Plan to resume Abilify, Wellbutrin, Cogentin once she is appropriate for a diet   7. Chronic systolic CHF  - Hx is unclear; she is not on diuretic  - She had 2 TTE's on October 02, 2014; one  features EF 50-55%, and the other 35-40%  - She is profoundly dehydrated on admission and was given a total of 5 liters NS in ED in addition to bicarbonate gtt - There was interstitial prominence on admission CXR suggestive of possible low-grade edema and she may require diuresis after her current condition has been stabilized     DVT prophylaxis: sq heparin  Code Status: Full  Family Communication: None available at time of admission Disposition Plan: Admit to ICU Consults called: None Admission status: Inpatient    Vianne Bulls, MD [facility] Pager [phone]  If 7PM-7AM, please contact night-coverage [email or web address] Password [facility]  09/24/2016, 9:43 PM

## 2016-09-24 NOTE — ED Notes (Signed)
Rectal temp 89.6. MD made aware. Warming blanket applied to patient.

## 2016-09-24 NOTE — ED Triage Notes (Signed)
[facility] and hyperglycemia. Per EMS CBG was 600. Husband gave patient unknown amount of insulin approx. 1.5 hours since given. Patient will not respond to questions or commands. Will make eye contact with speech.

## 2016-09-24 NOTE — ED Notes (Signed)
Dr. Glendell Dock[place]oke made aware of patient.

## 2016-09-24 NOTE — ED Notes (Signed)
Insulin drip increased to 10.8 units/hr.

## 2016-09-24 NOTE — ED Provider Notes (Signed)
[facility] DEPT Provider Note   CSN: [id number] Arrival date & time: 09/24/16  1404     History   Chief Complaint Chief Complaint  Patient presents with  . Hyperglycemia    HPI [name] is a 65 y.o. female.  She presents for evaluation of altered mental status. According to her husband. She fell going to the bathroom last night late on the floor all night. He states that normally she can walk. For the last couple of days. She's been weaker and less responsive than usual. She apparently saw her PCP, yesterday, to be evaluated for a rash on her perineum. Patient's husband states that she appears to be in a "diabetic coma". No other recent illnesses.  Level V caveat- altered mental status  HPI  Past Medical History:  Diagnosis Date  . Allergy   . Diabetes mellitus without complication (HCC)   . Mixed stress and urge urinary incontinence   . Schizophrenia [facility])     Patient Active Problem List   Diagnosis Date Noted  . Pneumonia due to Staphylococcus (HCC) 11/08/2014  . 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 CVA (cerebral infarction) 11/08/2014  . Chronic systolic congestive heart failure (HCC) 11/08/2014  . Anemia of chronic disease 11/08/2014  . Dysphagia 11/08/2014  . Schizophrenia, unspecified type (HCC) 11/08/2014  . Bacteremia due to Staphylococcus aureus   . Acute respiratory failure (HCC)   . Dehydration   . Community acquired pneumonia   . Anoxic brain injury (HCC)   . Acute respiratory failure with hypoxia (HCC)   . AKI (acute kidney injury) (HCC)   . Septic shock (HCC) 10/02/2014  . Encephalopathy acute 10/02/2014  . Altered mental status 10/01/2014  . Foreign body alimentary tract 10/01/2014  . Shock (HCC)   . Cardiac arrest (HCC)   . Foreign body ingestion   . Diabetes (HCC) 05/22/2014  . DKA (diabetic ketoacidoses) (HCC) 05/22/2014  . Constipation 05/22/2014  . Mood disorder (HCC) 05/22/2014    History reviewed. No  pertinent surgical history.  OB History    Gravida Para Term Preterm AB Living   0             SAB TAB Ectopic Multiple Live Births                   Home Medications    Prior to Admission medications   Medication Sig Start Date End Date Taking? Authorizing Provider  ARIPiprazole (ABILIFY) 15 MG tablet Take 15 mg by mouth daily.   Yes Historical Provider, MD  atorvastatin (LIPITOR) 10 MG tablet Take 1 tablet (10 mg total) by mouth daily. 10/13/14  Yes Jeanella Craze, NP  benztropine (COGENTIN) 1 MG tablet Take 1 mg by mouth at bedtime.    Yes Historical Provider, MD  buPROPion (WELLBUTRIN) 75 MG tablet Take 75 mg by mouth 2 (two) times daily.   Yes Historical Provider, MD  clotrimazole (LOTRIMIN) 1 % cream Apply 1 application topically 2 (two) times daily.   Yes Historical Provider, MD  ferrous sulfate 325 (65 FE) MG tablet Take 325 mg by mouth daily with breakfast.   Yes Historical Provider, MD  insulin detemir (LEVEMIR) 100 UNIT/ML injection Inject 40 Units into the skin at bedtime.   Yes Historical Provider, MD  Insulin Lispro (HUMALOG KWIKPEN) 200 UNIT/ML SOPN Inject 20 Units into the skin 2 (two) times daily with a meal.   Yes Historical Provider, MD  lisinopril (PRINIVIL,ZESTRIL) 5 MG tablet Take 5 mg  by mouth daily.   Yes Historical Provider, MD  metFORMIN (GLUCOPHAGE) 1000 MG tablet Take 1,000 mg by mouth 2 (two) times daily with a meal.   Yes Historical Provider, MD  mirabegron ER (MYRBETRIQ) 50 MG TB24 tablet Take 50 mg by mouth daily.   Yes Historical Provider, MD  mirtazapine (REMERON) 15 MG tablet Take 15 mg by mouth at bedtime.   Yes Historical Provider, MD  phenazopyridine (PYRIDIUM) 200 MG tablet Take 200 mg by mouth 3 (three) times daily as needed for pain.   Yes Historical Provider, MD    Family History Family History  Problem Relation Age of Onset  . Heart disease Mother   . Hypertension Father   . Stroke Father   . Heart disease Father   . Diabetes Father   .  Emphysema Paternal Grandfather     Social History Social History  Substance Use Topics  . Smoking status: Former Smoker    Packs/day: 3.00    Types: Cigarettes  . Smokeless tobacco: Never Used  . Alcohol use 3.6 oz/week    6 Cans of beer per week     Allergies   Penicillins   Review of Systems Review of Systems  Unable to perform ROS: Mental status change     Physical Exam Updated Vital Signs BP 95/56   Pulse 114   Temp 98.4 F (36.9 C)   Resp (!) 32   Ht 5\' 4"  (1.626 m)   Wt 110 lb (49.9 kg)   SpO2 100%   BMI 18.88 kg/m   Physical Exam  Constitutional: She appears well-developed.  She appears older than stated age.  HENT:  Head: Normocephalic.  Dry mucous membranes.  Eyes: Conjunctivae and EOM are normal. Pupils are equal, round, and reactive to light.  Neck: Normal range of motion and phonation normal. Neck supple.  Cardiovascular: Normal rate and regular rhythm.   Pulmonary/Chest: Effort normal and breath sounds normal. She exhibits no tenderness.  Abdominal: Soft. She exhibits no distension. There is no tenderness. There is no guarding.  Musculoskeletal: She exhibits no edema, tenderness or deformity.  Neurological: She is alert. She exhibits normal muscle tone.  Responsive to pain, scans room with eyes, does not move on command, or interact with the examiner. Moves arms and legs.  Skin: Skin is warm and dry.  Mild perineal, both anterior and posterior, erythema, nonspecific appearance. No external bleeding around the vagina or anus.  Psychiatric:  Confused  Nursing note and vitals reviewed.    ED Treatments / Results  Labs (all labs ordered are listed, but only abnormal results are displayed) Labs Reviewed  BASIC METABOLIC PANEL - Abnormal; Notable for the following:       Result Value   Sodium 129 (*)    Potassium 7.2 (*)    Chloride 97 (*)    CO2 <7 (*)    Glucose, Bld 1,016 (*)    BUN 58 (*)    Creatinine, Ser 2.51 (*)    Calcium 8.7  (*)    GFR calc non Af Amer 19 (*)    GFR calc Af Amer 22 (*)    All other components within normal limits  CBC - Abnormal; Notable for the following:    HCT 47.8 (*)    MCHC 29.3 (*)    All other components within normal limits  URINALYSIS, ROUTINE W REFLEX MICROSCOPIC (NOT AT [facility]) - Abnormal; Notable for the following:    Color, Urine ORANGE (*)  Glucose, UA >1000 (*)    Hgb urine dipstick SMALL (*)    Ketones, ur 40 (*)    Protein, ur 100 (*)    Nitrite POSITIVE (*)    All other components within normal limits  PROTIME-INR - Abnormal; Notable for the following:    Prothrombin Time 15.7 (*)    All other components within normal limits  BLOOD GAS, ARTERIAL - Abnormal; Notable for the following:    pO2, Arterial 160 (*)    All other components within normal limits  URINE MICROSCOPIC-ADD ON - Abnormal; Notable for the following:    Squamous Epithelial / LPF 0-5 (*)    Bacteria, UA FEW (*)    Casts GRANULAR CAST (*)    All other components within normal limits  BASIC METABOLIC PANEL - Abnormal; Notable for the following:    Sodium 131 (*)    Potassium 7.3 (*)    CO2 <7 (*)    Glucose, Bld 936 (*)    BUN 57 (*)    Creatinine, Ser 2.35 (*)    Calcium 8.4 (*)    GFR calc non Af Amer 21 (*)    GFR calc Af Amer 24 (*)    All other components within normal limits  BLOOD GAS, ARTERIAL - Abnormal; Notable for the following:    pH, Arterial 6.817 (*)    pO2, Arterial 164 (*)    Allens test (pass/fail) NOT INDICATED (*)    All other components within normal limits  BLOOD GAS, ARTERIAL - Abnormal; Notable for the following:    pH, Arterial 7.198 (*)    pO2, Arterial 128.0 (*)    All other components within normal limits  CBG MONITORING, ED - Abnormal; Notable for the following:    Glucose-Capillary >600 (*)    All other components within normal limits  I-STAT CG4 LACTIC ACID, ED - Abnormal; Notable for the following:    Lactic Acid, Venous 2.85 (*)    All other components  within normal limits  CBG MONITORING, ED - Abnormal; Notable for the following:    Glucose-Capillary >600 (*)    All other components within normal limits  CBG MONITORING, ED - Abnormal; Notable for the following:    Glucose-Capillary >600 (*)    All other components within normal limits  CBG MONITORING, ED - Abnormal; Notable for the following:    Glucose-Capillary 563 (*)    All other components within normal limits  CBG MONITORING, ED - Abnormal; Notable for the following:    Glucose-Capillary 485 (*)    All other components within normal limits  CULTURE, BLOOD (ROUTINE X 2)  CULTURE, BLOOD (ROUTINE X 2)  URINE CULTURE  CREATININE, URINE, RANDOM  SODIUM, URINE, RANDOM  POTASSIUM  POTASSIUM  POTASSIUM  OSMOLALITY  OSMOLALITY, URINE  POTASSIUM  I-STAT CHEM 8, ED  I-STAT CG4 LACTIC ACID, ED    EKG  EKG Interpretation  Date/Time:  Monday September 24 2016 14:08:50 EST Ventricular Rate:  69 PR Interval:    QRS Duration: 127 QT Interval:  472 QTC Calculation: 502 R Axis:   31 Text Interpretation:  Sinus rhythm Nonspecific intraventricular conduction delay Inferior infarct, age indeterminate Borderline ST elevation, anterolateral leads peaked T-waves Artifact Confirmed by Effie ShyWENTZ  MD, Delano Scardino [phone]([id number]) on 09/24/2016 4:13:55 PM       Radiology Ct Head Wo Contrast  Result Date: 09/24/2016 CLINICAL DATA:  Altered mental status and hyperglycemia. EXAM: CT HEAD WITHOUT CONTRAST TECHNIQUE: Contiguous axial images were obtained from the  base of the skull through the vertex without intravenous contrast. 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 No evidence for acute hemorrhage, mass lesion, midline shift, hydrocephalus or large new infarct. Vascular: No hyperdense vessel or unexpected calcification. Skull: Normal. Negative for fracture or focal lesion. Sinuses/Orbits: Small amount of fluid or mucosal thickening in the  maxillary sinuses. Evidence for a previous right maxillary antrostomy. Diffuse mucosal disease in the ethmoid air cells. Other: None. IMPRESSION: No acute intracranial abnormality. 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 Paranasal sinus disease. Electronically Signed   By: Richarda Overlie M.D.   On: 09/24/2016 19:54   Dg Chest Portable 1 View  Result Date: 09/24/2016 CLINICAL DATA:  Altered mental status, hyperglycemia. History of diabetes, schizophrenia. EXAM: PORTABLE CHEST 1 VIEW COMPARISON:  Portable chest x-ray of November 01, 2014 FINDINGS: The lungs are well-expanded. The interstitial markings are coarse. There is no alveolar infiltrate. There is no pleural effusion. The heart is top-normal in size. The pulmonary vascularity is prominent centrally. There is calcification in the wall of the aortic arch. The bony thorax exhibits no acute abnormality. IMPRESSION: There is mild interstitial prominence of both lungs more conspicuous than on previous studies. This may reflect low-grade interstitial edema. An area of confluent density in the right mid lung seen previously is no longer evident. Aortic atherosclerosis. Electronically Signed   By: David  [place] M.D.   On: 09/24/2016 15:12    Procedures Procedures (including critical care time)  Medications Ordered in ED Medications  insulin regular (NOVOLIN R,HUMULIN R) 250 Units in sodium chloride 0.9 % 250 mL (1 Units/mL) infusion (17 Units/hr Intravenous Rate/Dose Change 09/24/16 2041)  dextrose 5 %-0.45 % sodium chloride infusion (not administered)  albuterol (PROVENTIL) (2.5 MG/3ML) 0.083% nebulizer solution 10 mg (0 mg Nebulization Hold 09/24/16 1749)  sodium chloride 0.9 % bolus 1,000 mL (0 mLs Intravenous Stopped 09/24/16 1538)  sodium chloride 0.9 % bolus 1,000 mL (0 mLs Intravenous Stopped 09/24/16 1538)  sodium chloride 0.9 % bolus 1,000 mL (0 mLs Intravenous Stopped 09/24/16 1857)  sodium bicarbonate 150 mEq in sterile water 1,000 mL infusion (  Intravenous New Bag/Given 09/24/16 1611)  insulin aspart (novoLOG) injection 10 Units (10 Units Intravenous Given 09/24/16 1546)  sodium chloride 0.9 % bolus 2,000 mL (0 mLs Intravenous Stopped 09/24/16 1857)  calcium gluconate 1 g in sodium chloride 0.9 % 100 mL IVPB (0 g Intravenous Stopped 09/24/16 1850)  sodium bicarbonate injection 100 mEq (100 mEq Intravenous Given 09/24/16 1850)     Initial Impression / Assessment and Plan / ED Course  I have reviewed the triage vital signs and the nursing notes.  Pertinent labs & imaging results that were available during my care of the patient were reviewed by me and considered in my medical decision making (see chart for details).  Clinical Course as of Sep 24 2112  [facility] Sep 24, 2016  1528 High  Lactic Acid, Venous: (!!) 2.85 [EW]  1529 high Glucose-Capillary: (!!) >600 [EW]  1529 normal WBC: 9.5 [EW]  1529 low Sodium: (!) 129 [EW]  1529 high Potassium: (!!) 7.2 [EW]  1529 chloride Chloride: (!) 97 [EW]  1530 low CO2: (!) <7 [EW]  1530 high Glucose: (!!) 1,016 [EW]  1530 high BUN: (!) 58 [EW]  1530 high Creatinine: (!) 2.51 [EW]  1640 She is somewhat more alert at this time, now growing in response to questions. Continues to have eyes open and scans around  the room. Her husband now states that he thinks she has taken her insulin, less than half a times, which she is supposed to, in the last week.  [EW]  1644 Fluids have been ordered to complete 5 L. Will give 2 L heated saline.  [EW]  1645 Will CT head, when patient stable to go for scan.  [EW]  1822 Case discussed in depth with intensivist, Dr. Bennetta Laos. He recommends treating with 2 amps, bicarbonate IV push, then recheck ABG in one hour. He thinks that the patient can be managed at this facility[facility] hospital.  [EW]  [phone] She is resting more comfortably now with this time. Blood pressure now over 100 systolic. Patient's sister is here and states the patient has responded to her somewhat, but  continues to mumble.  [EW]    Clinical Course User Index [EW] Mancel Bale, MD    Medications  insulin regular (NOVOLIN R,HUMULIN R) 250 Units in sodium chloride 0.9 % 250 mL (1 Units/mL) infusion (17 Units/hr Intravenous Rate/Dose Change 09/24/16 2041)  dextrose 5 %-0.45 % sodium chloride infusion (not administered)  albuterol (PROVENTIL) (2.5 MG/3ML) 0.083% nebulizer solution 10 mg (0 mg Nebulization Hold 09/24/16 1749)  sodium chloride 0.9 % bolus 1,000 mL (0 mLs Intravenous Stopped 09/24/16 1538)  sodium chloride 0.9 % bolus 1,000 mL (0 mLs Intravenous Stopped 09/24/16 1538)  sodium chloride 0.9 % bolus 1,000 mL (0 mLs Intravenous Stopped 09/24/16 1857)  sodium bicarbonate 150 mEq in sterile water 1,000 mL infusion ( Intravenous New Bag/Given 09/24/16 1611)  insulin aspart (novoLOG) injection 10 Units (10 Units Intravenous Given 09/24/16 1546)  sodium chloride 0.9 % bolus 2,000 mL (0 mLs Intravenous Stopped 09/24/16 1857)  calcium gluconate 1 g in sodium chloride 0.9 % 100 mL IVPB (0 g Intravenous Stopped 09/24/16 1850)  sodium bicarbonate injection 100 mEq (100 mEq Intravenous Given 09/24/16 1850)    Patient Vitals for the past 24 hrs:  BP Temp Temp src Pulse Resp SpO2 Height Weight  09/24/16 2030 95/56 98.4 F (36.9 C) - 114 (!) 32 100 % - -  09/24/16 2025 95/59 98.2 F (36.8 C) - 114 (!) 30 90 % - -  09/24/16 2020 96/57 98.2 F (36.8 C) - 114 (!) 28 93 % - -  09/24/16 2015 90/55 98.1 F (36.7 C) - 118 (!) 29 93 % - -  09/24/16 1945 90/61 97.7 F (36.5 C) - 111 (!) 40 91 % - -  09/24/16 1915 103/62 97 F (36.1 C) - 111 - 100 % - -  09/24/16 1845 (!) 79/49 (!) 95.9 F (35.5 C) - 88 22 100 % - -  09/24/16 1815 (!) 101/52 (!) 95.2 F (35.1 C) - 86 25 100 % - -  09/24/16 1745 (!) 84/57 (!) 94.3 F (34.6 C) - 81 24 100 % - -  09/24/16 1730 (!) 83/49 (!) 93.7 F (34.3 C) - 79 20 100 % - -  09/24/16 1630 (!) 85/54 (!) 91.8 F (33.2 C) - 73 21 99 % - -  09/24/16 1600 (!) 81/42 (!)  91.4 F (33 C) - (!) 138 20 91 % - -  09/24/16 1545 (!) 72/40 (!) 91.2 F (32.9 C) - (!) 34 23 (!) 82 % - -  09/24/16 1530 (!) 60/51 (!) 90.9 F (32.7 C) - 69 20 100 % - -  09/24/16 1500 (!) 80/37 (!) 90.5 F (32.5 C) - 69 22 100 % - -  09/24/16 1445 (!) 77/49 Marland Kitchen)  90.3 F (32.4 C) - 70 20 94 % - -  09/24/16 1430 (!) 72/46 (!) 90.1 F (32.3 C) - 69 19 100 % - -  09/24/16 1428 (!) 66/38 - - - - 96 % - -  09/24/16 1424 - - - 68 24 - - -  09/24/16 1407 - - - - - - 5\' 4"  (1.626 m) 110 lb (49.9 kg)  09/24/16 1404 - - Rectal - - - - -    9:05 PM Reevaluation with update and discussion. After initial assessment and treatment, an updated evaluation reveals patient remains calmer, as compared to when she arrived. She continues to have some improvement in her mental status, and is now responding quickly when her sister speaks to her, and is articulating better. Findings and plan discussed with patient's sister who is in agreement. Shakhia Gramajo L    9:10 PM-Consult complete with Hospitalist. Patient case explained and discussed. He agrees to admit patient for further evaluation and treatment. Call ended at 21:20  CRITICAL CARE Performed by: Flint MelterWENTZ,Ananiah Maciolek L Total critical care time: 75 minutes Critical care time was exclusive of separately billable procedures and treating other patients. Critical care was necessary to treat or prevent imminent or life-threatening deterioration. Critical care was time spent personally by me on the following activities: development of treatment plan with patient and/or surrogate as well as nursing, discussions with consultants, evaluation of patient's response to treatment, examination of patient, obtaining history from patient or surrogate, ordering and performing treatments and interventions, ordering and review of laboratory studies, ordering and review of radiographic studies, pulse oximetry and re-evaluation of patient's condition.  Final Clinical Impressions(s)  / ED Diagnoses   Final diagnoses:  Diabetic ketoacidosis with coma associated with type 1 diabetes mellitus (HCC)  AKI (acute kidney injury) (HCC)  Noncompliance with medication regimen   Marked hyperglycemia, with hyponatremia, and elevated lactate. No clear focus for infection. On clinical examination. White blood cell count is normal. Blood cultures have been ordered and are pending. Review of  history and notes from yesterday when she was seen in the office for dysuria; indicate that at that time she was alert, and "well-appearing". She was prescribed Pyridium for dysuria. A urine culture was sent. During treatment course in ED, patient received 5 L of saline. She had improvement of pH from undetectable to 7.2. Her mental status improved. Head CT negative for acute intracranial bleeding or evident stroke. Suspect multifocal renal altered mental status, and worsening diabetes and psychiatric illness from medication noncompliance. She will require admission with aggressive treatment, in the intensive care unit.  Nursing Notes Reviewed/ Care Coordinated, and agree without changes. Applicable Imaging Reviewed.  Interpretation of Laboratory Data incorporated into ED treatment  Plan: Admit  New Prescriptions New Prescriptions   No medications on file     Mancel BaleElliott Kaelan Emami, MD 09/24/16 2123

## 2016-09-24 NOTE — ED Notes (Signed)
Nurse in ICU will call writer back for report.

## 2016-09-24 NOTE — ED Notes (Signed)
CRITICAL VALUE ALERT  Critical value received:  Glucose 936  Date of notification:  09-24-16  Time of notification:  1736  Critical value read back:YES  Nurse who received alert:  Sharia ReeveLeigh Wendelyn Kiesling, RN  MD notified (1st page):  [phone] Dr. Effie ShyWentz

## 2016-09-24 NOTE — ED Notes (Signed)
CRITICAL VALUE ALERT  Critical value received:  Glucose 1,016  Date of notification:  09-24-2016  Time of notification:  1508  Critical value read back:YES  Nurse who received alert:  Sharia ReeveLeigh Jamare Vanatta, RN  MD notified (1st page):  Dr. Effie ShyWentz  Time of first page:  [phone]

## 2016-09-24 NOTE — ED Notes (Signed)
ED Provider at bedside. 

## 2016-09-24 NOTE — ED Notes (Signed)
Ph < below reportable range  c02 < below reportable range  P02: 160 Bicarb: no reading due to other readings being below reportable range,  Spo2: 96.3   Results given to Dr. Effie ShyWentz.

## 2016-09-24 NOTE — ED Notes (Addendum)
CRITICAL VALUE ALERT  Critical value received:  Potassium 7.3  Date of notification:  09/24/16  Time of notification:  1743  Critical value read back: YES  Nurse who received alert:  Sharia ReeveLeigh Ronne Savoia, RN  MD notified (1st page):  Dr. Effie ShyWentz at [phone]

## 2016-09-24 NOTE — Progress Notes (Signed)
Lactic acid called by lab critical 2.5 Result is lower than 2.85 of previous draw

## 2016-09-24 NOTE — ED Notes (Signed)
[name]-sister call for contact. [phone]

## 2016-09-24 NOTE — ED Notes (Signed)
CRITICAL VALUE ALERT  Critical value received:  Potassium 7.2  Date of notification:  10-04-2016  Time of notification:  1508  Critical value read back: YES  Nurse who received alert:  Sharia ReeveLeigh Demitria Hay, RN  MD notified (1st page):  Dr. Effie ShyWentz  Time of first page:  [phone]

## 2016-09-25 DIAGNOSIS — L8990 Pressure ulcer of unspecified site, unspecified stage: Secondary | ICD-10-CM | POA: Insufficient documentation

## 2016-09-25 LAB — POCT I-STAT, CHEM 8
BUN: 65 mg/dL — AB (ref 6–20)
CALCIUM ION: 1.22 mmol/L (ref 1.15–1.40)
CHLORIDE: 106 mmol/L (ref 101–111)
CREATININE: 2.1 mg/dL — AB (ref 0.44–1.00)
HCT: 47 % — ABNORMAL HIGH (ref 36.0–46.0)
Hemoglobin: 16 g/dL — ABNORMAL HIGH (ref 12.0–15.0)
POTASSIUM: 7.1 mmol/L — AB (ref 3.5–5.1)
SODIUM: 130 mmol/L — AB (ref 135–145)
TCO2: 7 mmol/L (ref 0–100)

## 2016-09-25 LAB — GLUCOSE, CAPILLARY
GLUCOSE-CAPILLARY: 115 mg/dL — AB (ref 65–99)
GLUCOSE-CAPILLARY: 126 mg/dL — AB (ref 65–99)
GLUCOSE-CAPILLARY: 134 mg/dL — AB (ref 65–99)
GLUCOSE-CAPILLARY: 153 mg/dL — AB (ref 65–99)
GLUCOSE-CAPILLARY: 235 mg/dL — AB (ref 65–99)
GLUCOSE-CAPILLARY: 249 mg/dL — AB (ref 65–99)
GLUCOSE-CAPILLARY: 271 mg/dL — AB (ref 65–99)
GLUCOSE-CAPILLARY: 93 mg/dL (ref 65–99)
GLUCOSE-CAPILLARY: 97 mg/dL (ref 65–99)
Glucose-Capillary: 176 mg/dL — ABNORMAL HIGH (ref 65–99)
Glucose-Capillary: 170 mg/dL — ABNORMAL HIGH (ref 65–99)
Glucose-Capillary: 189 mg/dL — ABNORMAL HIGH (ref 65–99)
Glucose-Capillary: 167 mg/dL — ABNORMAL HIGH (ref 65–99)
Glucose-Capillary: 100 mg/dL — ABNORMAL HIGH (ref 65–99)
Glucose-Capillary: 116 mg/dL — ABNORMAL HIGH (ref 65–99)
Glucose-Capillary: 124 mg/dL — ABNORMAL HIGH (ref 65–99)
Glucose-Capillary: 156 mg/dL — ABNORMAL HIGH (ref 65–99)

## 2016-09-25 LAB — BASIC METABOLIC PANEL
ANION GAP: 13 (ref 5–15)
ANION GAP: 8 (ref 5–15)
Anion gap: 9 (ref 5–15)
BUN: 62 mg/dL — AB (ref 6–20)
BUN: 62 mg/dL — AB (ref 6–20)
BUN: 64 mg/dL — ABNORMAL HIGH (ref 6–20)
CALCIUM: 7.9 mg/dL — AB (ref 8.9–10.3)
CHLORIDE: 119 mmol/L — AB (ref 101–111)
CHLORIDE: 120 mmol/L — AB (ref 101–111)
CO2: 16 mmol/L — ABNORMAL LOW (ref 22–32)
CO2: 18 mmol/L — ABNORMAL LOW (ref 22–32)
CO2: 21 mmol/L — ABNORMAL LOW (ref 22–32)
CREATININE: 1.8 mg/dL — AB (ref 0.44–1.00)
Calcium: 7.9 mg/dL — ABNORMAL LOW (ref 8.9–10.3)
Calcium: 7.6 mg/dL — ABNORMAL LOW (ref 8.9–10.3)
Chloride: 116 mmol/L — ABNORMAL HIGH (ref 101–111)
Creatinine, Ser: 2.02 mg/dL — ABNORMAL HIGH (ref 0.44–1.00)
Creatinine, Ser: 1.88 mg/dL — ABNORMAL HIGH (ref 0.44–1.00)
GFR calc Af Amer: 33 mL/min — ABNORMAL LOW (ref >60)
GFR, EST AFRICAN AMERICAN: 29 mL/min — AB (ref >60)
GFR, EST AFRICAN AMERICAN: 32 mL/min — AB (ref >60)
GFR, EST NON AFRICAN AMERICAN: 25 mL/min — AB (ref >60)
GFR, EST NON AFRICAN AMERICAN: 27 mL/min — AB (ref >60)
GFR, EST NON AFRICAN AMERICAN: 29 mL/min — AB (ref >60)
GLUCOSE: 111 mg/dL — AB (ref 65–99)
Glucose, Bld: 260 mg/dL — ABNORMAL HIGH (ref 65–99)
Glucose, Bld: 199 mg/dL — ABNORMAL HIGH (ref 65–99)
POTASSIUM: 3 mmol/L — AB (ref 3.5–5.1)
POTASSIUM: 3.3 mmol/L — AB (ref 3.5–5.1)
Potassium: 3.6 mmol/L (ref 3.5–5.1)
SODIUM: 148 mmol/L — AB (ref 135–145)
SODIUM: 147 mmol/L — AB (ref 135–145)
Sodium: 145 mmol/L (ref 135–145)

## 2016-09-25 LAB — BLOOD GAS, ARTERIAL
ACID-BASE DEFICIT: 10.5 mmol/L — AB (ref 0.0–2.0)
BICARBONATE: 16.5 mmol/L — AB (ref 20.0–28.0)
DRAWN BY: 270271
O2 Content: 4 L/min
O2 Saturation: 97.5 %
PATIENT TEMPERATURE: 37
PH ART: 7.307 — AB (ref 7.350–7.450)
PO2 ART: 99.6 mmHg (ref 83.0–108.0)
pCO2 arterial: 30 mmHg — ABNORMAL LOW (ref 32.0–48.0)

## 2016-09-25 LAB — MRSA PCR SCREENING: MRSA BY PCR: POSITIVE — AB

## 2016-09-25 LAB — OSMOLALITY, URINE: OSMOLALITY UR: 473 mosm/kg (ref 300–900)

## 2016-09-25 LAB — OSMOLALITY: OSMOLALITY: 383 mosm/kg — AB (ref 275–295)

## 2016-09-25 LAB — LACTIC ACID, PLASMA: LACTIC ACID, VENOUS: 1.2 mmol/L (ref 0.5–1.9)

## 2016-09-25 MED ORDER — CHLORHEXIDINE GLUCONATE CLOTH 2 % EX PADS
6.0000 | MEDICATED_PAD | Freq: Every day | CUTANEOUS | Status: DC
  Administered 2016-09-25: 6 via TOPICAL

## 2016-09-25 MED ORDER — INSULIN ASPART 100 UNIT/ML ~~LOC~~ SOLN: 0.0000 [IU] | Freq: Every day | SUBCUTANEOUS | Status: DC

## 2016-09-25 MED ORDER — INSULIN ASPART 100 UNIT/ML ~~LOC~~ SOLN: 0.0000 [IU] | Freq: Three times a day (TID) | SUBCUTANEOUS | Status: DC

## 2016-09-25 MED ORDER — POTASSIUM CHLORIDE 10 MEQ/100ML IV SOLN
10.0000 meq | INTRAVENOUS | Status: AC
  Administered 2016-09-25 (×2): 10 meq via INTRAVENOUS

## 2016-09-25 MED ORDER — SODIUM CHLORIDE 0.9 % IV SOLN
INTRAVENOUS | Status: AC
  Filled 2016-09-25: qty 2.5

## 2016-09-25 MED ORDER — MUPIROCIN 2 % EX OINT
1.0000 "application " | TOPICAL_OINTMENT | Freq: Two times a day (BID) | CUTANEOUS | Status: DC
  Administered 2016-09-25 – 2016-09-27 (×5): 1 via NASAL
  Filled 2016-09-25: qty 22

## 2016-09-25 MED ORDER — INSULIN DETEMIR 100 UNIT/ML ~~LOC~~ SOLN
40.0000 [IU] | Freq: Every day | SUBCUTANEOUS | Status: DC
  Administered 2016-09-25: 40 [IU] via SUBCUTANEOUS
  Filled 2016-09-25 (×3): qty 0.4

## 2016-09-25 MED ORDER — POTASSIUM CHLORIDE 10 MEQ/100ML IV SOLN
INTRAVENOUS | Status: AC
  Administered 2016-09-25: 10 meq
  Filled 2016-09-25: qty 200

## 2016-09-25 MED ORDER — POTASSIUM CHLORIDE IN NACL 20-0.45 MEQ/L-% IV SOLN
INTRAVENOUS | Status: DC
  Administered 2016-09-25 – 2016-09-26 (×2): via INTRAVENOUS
  Filled 2016-09-25 (×4): qty 1000

## 2016-09-25 NOTE — Progress Notes (Signed)
Inpatient Diabetes Program Recommendations  AACE/ADA: New Consensus Statement on Inpatient Glycemic Control (2015)  Target Ranges:  Prepandial:   less than 140 mg/dL      Peak postprandial:   less than 180 mg/dL (1-2 hours)      Critically ill patients:  140 - 180 mg/dL  Results for [name], [name] (MRN [id number]) as of 09/25/2016 11:20  Ref. Range 09/25/2016 02:55 09/25/2016 04:03 09/25/2016 04:52 09/25/2016 06:34 09/25/2016 07:39 09/25/2016 08:42 09/25/2016 09:29 09/25/2016 10:32  Glucose-Capillary Latest Ref Range: 65 - 99 mg/dL 409271 (H) 811176 (H) 914170 (H) 189 (H) 167 (H) 153 (H) 134 (H) 126 (H)  Results for [name], [name] (MRN [id number]) as of 09/25/2016 11:20  Ref. Range 09/24/2016 14:11  Glucose Latest Ref Range: 65 - 99 mg/dL 0,8651,016 [facility])  Results for [name], [name] (MRN [id number]) as of 09/25/2016 11:20  Ref. Range 05/22/2014 19:14  Hemoglobin A1C Latest Ref Range: <5.7 % 12.8 (H)    Review of Glycemic Control  Diabetes history: DM2 Outpatient Diabetes medications: Levemir 40 units QHS, Humalog 20 units BID with meals, Metformin 1000 mg BID Current orders for Inpatient glycemic control: Novolin R insulin drip per DKA order set  Inpatient Diabetes Program Recommendations: Insulin - Basal: Once acidosis is cleared (as determined by MD) and MD is ready to transition off IV insulin to SQ insulin, recommend starting with Levemir 22 units Q24H (based on 54 kg x 0.4 units). Correction (SSI): Once acidosis is cleared (as determined by MD) and MD is ready to transition off IV insulin to SQ insulin, recommend ordering Novolog 0- 9 units Q4H. A1C: Please add an A1C on to blood in lab to evaluate glycemic control over the past 2-3 months.  NOTE: Per H&P on 09/24/16 patient has history of poor adherence to her medications. Patient is currently on insulin drip and drip rate over the past 3 hours has ranged from 2-5.9 units per hour.   Thanks, Orlando PennerMarie Dejion Grillo, RN, MSN, CDE Diabetes  Coordinator Inpatient Diabetes Program [phone] (Team Pager from 8am to 5pm)

## 2016-09-25 NOTE — Progress Notes (Signed)
PROGRESS NOTE    [name]  ZOX:[id number] DOB: 1951-06-27 DOA: 09/24/2016 PCP: Terressa Koyanagi    Brief Narrative: 89 yof with a history of schizophrenia, poor compliance with medications, and type 2 DM, presents after being found down and unresponsive with an elevated blood sugar according to her husband. He administered her with an unknown amount of insulin and called EMS in which her CBG was found to be 600 with poor responsiveness. While in the ED, she was noted to be hypothermic on 2L on supplemental oxygen, hypotensive, hyperkalemic, and hyponatremic.She also has an elevated lactic acid and creatinine. Chest x-ray was notable for mild interstitial prominence suggestive of possible low-grade interstitial edema. She was started on insulin infusions and IV fluids and admitted for further treatment of DKA.    Assessment & Plan:   Principal Problem:   Diabetic ketoacidosis associated with type 2 diabetes mellitus (HCC) Active Problems:   Altered mental status   Encephalopathy acute   AKI (acute kidney injury) (HCC)   Chronic systolic congestive heart failure (HCC)   Schizophrenia (HCC)   Hyperkalemia   Hyponatremia   Metabolic acidosis   DKA, type 2 (HCC)   Pressure injury of skin  1. Diabetic ketoacidosis. Patient presents with a glucose of 1,000 and a low pH. Patient is noted to have poor compliance with her medications. With aggressive IV hydration and insulin infusion, blood sugars have improved and anion gap has also improved. Will transition back to subcutaneous insulin when she is awake enough to eat and blood sugars remain in goal range.  2. Hyperkalemia. Patient had a potassium of 7.2 on admission, related to DKA and AKI. Potassium has improved to 3.3.  3. Metabolic acidosis. Patient presented with a lactic acid of 2.85, now resolved. This was secondary to DKA.  4. AKI. Creatinine was 2.51 on admission, now 1.81. Secondary to DKA. Continue IV fluids for now.  Continue to hold ACE inhibitor. 5. Chronic systolic CHF. She is not on a diuretic. She had 2 echocardiograms in November of 2017 that showed an EF of 50-55% and 35-40%. CXR suggestive of possible low-grade edema. Will consider diuresing her once she is stable.  6. Type 2 DM. Patient was noted to be DKA on admission. Continue on insulin infusion and transition to subcutaneous insulin once improved  7. Hyponatremia. Sodium was 129 on admission, now 147. Will likely need to change to hypotonic fluids. Continue hydration. 8. HLD. Continue statin.  9. Schizophrenia. Continue Abilify, Wellbutrin, and cogentin when she is able to improve her diet.   DVT prophylaxis: Heparin injection  Code Status: FULL  Family Communication: No family bedside Disposition Plan: Discharge home once improved.    Consultants:   None   Procedures:   None   Antimicrobials:   None    Subjective: Patient is somnolent but easily wakes up to voice. Knows she is in the hospital. Admits to noncompliance of medications. Denies any other complaints.  Objective: Vitals:   09/25/16 0200 09/25/16 0300 09/25/16 0400 09/25/16 0500  BP: (!) 92/59 98/62 99/60    Pulse: (!) 114 (!) 113 (!) 111   Resp: (!) 21 (!) 23 (!) 21   Temp:   98.7 F (37.1 C)   TempSrc:   Oral   SpO2: 97% 97% 99%   Weight:    54.6 kg (120 lb 5.9 oz)  Height:        Intake/Output Summary (Last 24 hours) at 09/25/16 8119 Last data filed at 09/25/16 0500  Gross per 24 hour  Intake             5622 ml  Output              900 ml  Net             4722 ml   Filed Weights   09/24/16 1407 09/24/16 2301 09/25/16 0500  Weight: 49.9 kg (110 lb) 54.6 kg (120 lb 5.9 oz) 54.6 kg (120 lb 5.9 oz)    Examination:  General exam: Appears calm and comfortable  Respiratory system: Clear to auscultation. Respiratory effort normal. Cardiovascular system: S1 & S2 heard, RRR. No JVD, murmurs, rubs, gallops or clicks. No pedal edema. Gastrointestinal  system: Abdomen is nondistended, soft and nontender. No organomegaly or masses felt. Normal bowel sounds heard. Central nervous system: Somnolent. No focal neurological deficits. Extremities: Symmetric 5 x 5 power. Skin: No rashes, lesions or ulcers Psychiatry: Somnolent     Data Reviewed: I have personally reviewed following labs and imaging studies  CBC:  Recent Labs Lab 09/24/16 1411  WBC 9.5  HGB 14.0  HCT 47.8*  MCV 97.8  PLT 290   Basic Metabolic Panel:  Recent Labs Lab 09/24/16 1411 09/24/16 1545 09/24/16 2031 09/25/16 0135 09/25/16 0424  NA 129* 131* 141 148* 147*  K 7.2* 7.3* 3.9 3.0* 3.3*  CL 97* 102 113* 119* 120*  CO2 <7* <7* 7* 16* 18*  GLUCOSE 1,016* 936* 539* 260* 199*  BUN 58* 57* 55* 62* 62*  CREATININE 2.51* 2.35* 2.19* 2.02* 1.88*  CALCIUM 8.7* 8.4* 7.5* 7.9* 7.6*  MG  --   --  2.3  --   --    GFR: Estimated Creatinine Clearance: 26.4 mL/min (by C-G formula based on SCr of 1.88 mg/dL (H)). Liver Function Tests: No results for input(s): AST, ALT, ALKPHOS, BILITOT, PROT, ALBUMIN in the last 168 hours. No results for input(s): LIPASE, AMYLASE in the last 168 hours. No results for input(s): AMMONIA in the last 168 hours. Coagulation Profile:  Recent Labs Lab 09/24/16 1420  INR 1.24   Cardiac Enzymes: No results for input(s): CKTOTAL, CKMB, CKMBINDEX, TROPONINI in the last 168 hours. BNP (last 3 results) No results for input(s): PROBNP in the last 8760 hours. HbA1C: No results for input(s): HGBA1C in the last 72 hours. CBG:  Recent Labs Lab 09/25/16 0052 09/25/16 0151 09/25/16 0255 09/25/16 0403 09/25/16 0452  GLUCAP 249* 235* 271* 176* 170*   Lipid Profile: No results for input(s): CHOL, HDL, LDLCALC, TRIG, CHOLHDL, LDLDIRECT in the last 72 hours. Thyroid Function Tests: No results for input(s): TSH, T4TOTAL, FREET4, T3FREE, THYROIDAB in the last 72 hours. Anemia Panel: No results for input(s): VITAMINB12, FOLATE, FERRITIN,  TIBC, IRON, RETICCTPCT in the last 72 hours. Sepsis Labs:  Recent Labs Lab 09/24/16 1440 09/24/16 2031 09/25/16 0135  LATICACIDVEN 2.85* 2.5* 1.2    Recent Results (from the past 240 hour(s))  Culture, blood (Routine x 2)     Status: None (Preliminary result)   Collection Time: 09/24/16  3:41 PM  Result Value Ref Range Status   Specimen Description BLOOD BLOOD RIGHT ARM  Final   Special Requests BOTTLES DRAWN AEROBIC AND ANAEROBIC 6 CC EACH  Final   Culture PENDING  Incomplete   Report Status PENDING  Incomplete  Culture, blood (Routine x 2)     Status: None (Preliminary result)   Collection Time: 09/24/16  3:46 PM  Result Value Ref Range Status   Specimen Description BLOOD BLOOD  RIGHT HAND  Final   Special Requests BOTTLES DRAWN AEROBIC AND ANAEROBIC 6 CC EACH  Final   Culture PENDING  Incomplete   Report Status PENDING  Incomplete         Radiology Studies: Ct Head Wo Contrast  Result Date: 09/24/2016 CLINICAL DATA:  Altered mental status and hyperglycemia. EXAM: CT HEAD WITHOUT CONTRAST TECHNIQUE: Contiguous axial images were obtained from the base of the skull through the vertex without intravenous contrast. 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 No evidence for acute hemorrhage, mass lesion, midline shift, hydrocephalus or large new infarct. Vascular: No hyperdense vessel or unexpected calcification. Skull: Normal. Negative for fracture or focal lesion. Sinuses/Orbits: Small amount of fluid or mucosal thickening in the maxillary sinuses. Evidence for a previous right maxillary antrostomy. Diffuse mucosal disease in the ethmoid air cells. Other: None. IMPRESSION: No acute intracranial abnormality. 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 Paranasal sinus disease. Electronically Signed   By: Richarda OverlieAdam  Henn M.D.   On: 09/24/2016 19:54   Dg Chest Portable 1 View  Result Date: 09/24/2016 CLINICAL DATA:  Altered  mental status, hyperglycemia. History of diabetes, schizophrenia. EXAM: PORTABLE CHEST 1 VIEW COMPARISON:  Portable chest x-ray of November 01, 2014 FINDINGS: The lungs are well-expanded. The interstitial markings are coarse. There is no alveolar infiltrate. There is no pleural effusion. The heart is top-normal in size. The pulmonary vascularity is prominent centrally. There is calcification in the wall of the aortic arch. The bony thorax exhibits no acute abnormality. IMPRESSION: There is mild interstitial prominence of both lungs more conspicuous than on previous studies. This may reflect low-grade interstitial edema. An area of confluent density in the right mid lung seen previously is no longer evident. Aortic atherosclerosis. Electronically Signed   By: David  SwazilandJordan M.D.   On: 09/24/2016 15:12        Scheduled Meds: . albuterol  10 mg Nebulization Once  . ARIPiprazole  15 mg Oral Daily  . atorvastatin  10 mg Oral Daily  . benztropine  1 mg Oral QHS  . buPROPion  75 mg Oral BID  . ferrous sulfate  325 mg Oral Q breakfast  . heparin  5,000 Units Subcutaneous Q8H  . mirabegron ER  50 mg Oral Daily  . mirtazapine  15 mg Oral QHS   Continuous Infusions: . sodium chloride Stopped (09/25/16 0054)  . dextrose 5 % and 0.45% NaCl 75 mL/hr at 09/25/16 0057  . insulin (NOVOLIN-R) infusion       LOS: 1 day    Time spent: 25 minutes     Erick BlinksJehanzeb Jessicah Croll, MD [facility] If 7PM-7AM, please contact night-coverage [email or web address] Password [email or web address] 09/25/2016, 6:23 AM

## 2016-09-26 DIAGNOSIS — D696 Thrombocytopenia, unspecified: Secondary | ICD-10-CM | POA: Diagnosis present

## 2016-09-26 LAB — CBC
HCT: 38.4 % (ref 36.0–46.0)
HEMOGLOBIN: 13.3 g/dL (ref 12.0–15.0)
MCH: 28.6 pg (ref 26.0–34.0)
MCHC: 34.6 g/dL (ref 30.0–36.0)
MCV: 82.6 fL (ref 78.0–100.0)
PLATELETS: 87 10*3/uL — AB (ref 150–400)
RBC: 4.65 MIL/uL (ref 3.87–5.11)
RDW: 14.8 % (ref 11.5–15.5)
WBC: 6.9 10*3/uL (ref 4.0–10.5)

## 2016-09-26 LAB — GLUCOSE, CAPILLARY
GLUCOSE-CAPILLARY: 61 mg/dL — AB (ref 65–99)
GLUCOSE-CAPILLARY: 101 mg/dL — AB (ref 65–99)
Glucose-Capillary: 123 mg/dL — ABNORMAL HIGH (ref 65–99)
Glucose-Capillary: <10 mg/dL — CL (ref 65–99)
Glucose-Capillary: 79 mg/dL (ref 65–99)
Glucose-Capillary: 165 mg/dL — ABNORMAL HIGH (ref 65–99)

## 2016-09-26 LAB — URINE CULTURE

## 2016-09-26 LAB — BASIC METABOLIC PANEL
Anion gap: 6 (ref 5–15)
BUN: 64 mg/dL — AB (ref 6–20)
CO2: 21 mmol/L — ABNORMAL LOW (ref 22–32)
CREATININE: 1.76 mg/dL — AB (ref 0.44–1.00)
Calcium: 8.1 mg/dL — ABNORMAL LOW (ref 8.9–10.3)
Chloride: 117 mmol/L — ABNORMAL HIGH (ref 101–111)
GFR, EST AFRICAN AMERICAN: 34 mL/min — AB (ref >60)
GFR, EST NON AFRICAN AMERICAN: 30 mL/min — AB (ref >60)
Glucose, Bld: 92 mg/dL (ref 65–99)
POTASSIUM: 2.9 mmol/L — AB (ref 3.5–5.1)
SODIUM: 144 mmol/L (ref 135–145)

## 2016-09-26 MED ORDER — INSULIN ASPART 100 UNIT/ML ~~LOC~~ SOLN: 0.0000 [IU] | Freq: Every day | SUBCUTANEOUS | Status: DC

## 2016-09-26 MED ORDER — INSULIN DETEMIR 100 UNIT/ML ~~LOC~~ SOLN
20.0000 [IU] | Freq: Every day | SUBCUTANEOUS | Status: DC
  Filled 2016-09-26: qty 0.2

## 2016-09-26 MED ORDER — DEXTROSE 50 % IV SOLN
INTRAVENOUS | Status: AC
Start: 2016-09-26 — End: 2016-09-26
  Filled 2016-09-26: qty 50

## 2016-09-26 MED ORDER — INSULIN ASPART 100 UNIT/ML ~~LOC~~ SOLN
0.0000 [IU] | Freq: Three times a day (TID) | SUBCUTANEOUS | Status: DC
  Administered 2016-09-27: 5 [IU] via SUBCUTANEOUS
  Administered 2016-09-27: 1 [IU] via SUBCUTANEOUS

## 2016-09-26 MED ORDER — ACETAMINOPHEN 325 MG PO TABS
650.0000 mg | ORAL_TABLET | Freq: Four times a day (QID) | ORAL | Status: DC | PRN
Start: 2016-09-26 — End: 2016-09-27
  Administered 2016-09-26: 650 mg via ORAL
  Filled 2016-09-26: qty 2

## 2016-09-26 MED ORDER — INSULIN DETEMIR 100 UNIT/ML ~~LOC~~ SOLN
20.0000 [IU] | Freq: Every day | SUBCUTANEOUS | Status: DC
  Filled 2016-09-26 (×2): qty 0.2

## 2016-09-26 MED ORDER — POTASSIUM CHLORIDE CRYS ER 20 MEQ PO TBCR
40.0000 meq | EXTENDED_RELEASE_TABLET | ORAL | Status: AC
  Administered 2016-09-26 (×3): 40 meq via ORAL
  Filled 2016-09-26 (×3): qty 2

## 2016-09-26 NOTE — Progress Notes (Signed)
PROGRESS NOTE    [name]  ZOX:[id number] DOB: 10-02-1951 DOA: 09/24/2016 PCP: Terressa KoyanagiLONG,ASHLEY B, PA-C    Brief Narrative: [age] yof with a history of schizophrenia, poor compliance with medications, and type 2 DM, presents after being found down and unresponsive with an elevated blood sugar according to her husband. He administered her with an unknown amount of insulin and called EMS in which her CBG was found to be 600 with poor responsiveness. While in the ED, she was noted to be hypothermic on 2L on supplemental oxygen, hypotensive, hyperkalemic, and hyponatremic.She also has an elevated lactic acid and creatinine. Chest x-ray was notable for mild interstitial prominence suggestive of possible low-grade interstitial edema. She was started on insulin infusions and IV fluids and admitted for further treatment of DKA.    Assessment & Plan:   Principal Problem:   Diabetic ketoacidosis associated with type 2 diabetes mellitus (HCC) Active Problems:   Altered mental status   Encephalopathy acute   AKI (acute kidney injury) (HCC)   Chronic systolic congestive heart failure (HCC)   Schizophrenia (HCC)   Hyperkalemia   Hyponatremia   Metabolic acidosis   DKA, type 2 (HCC)   Pressure injury of skin   Thrombocytopenia (HCC)  1. Diabetic ketoacidosis. Patient presents with a glucose over 1,000 mg/dl and a low pH. Patient is noted to have poor compliance with her medications. With aggressive IV hydration and insulin infusion, blood sugars have improved and anion gap has also improved. She has been transitioned back to subcutaneous insulin, but did have some significant hypoglycemia overnight. Will adjust basal insulin further and continue to monitor blood sugars.  2. Hyperkalemia. Patient had a potassium of 7.2 on admission, related to DKA and AKI. Potassium had improved, but now she is hypokalemic with a potassium of 2.9. Will replace.  3. Metabolic acidosis. Patient presented with a lactic  acid of 2.85, now resolved. This was secondary to DKA. Improved with IV fluids. 4. AKI. Creatinine was 2.51 on admission, now 1.76. Secondary to DKA. Continue IV fluids for now. Continue to hold ACE inhibitor. 5. Chronic systolic CHF. She is not on a diuretic. She had 2 echocardiograms in November of 2017 that showed an EF of 50-55% and 35-40%. CXR suggestive of possible low-grade edema. She does not have any clinical evidence of volume overload at this time..  6. Type 2 DM. Patient was noted to be DKA on admission. She has since been transitioned to subcutaneous insulin. Appreciate diabetes coordinator and . 7. Hyponatremia. Sodium was 129 on admission, likely pseudohyponatremia. Improved with IV fluids and correction of hyperglycemia. 8. HLD. Continue statin.  9. Schizophrenia. Continue Abilify, Wellbutrin, and cogentin. Follow-up with psychiatrist 10. Thrombocytopenia. Etiology is unclear. She does not have any evidence of bleeding. We'll hold heparin for now. Recheck in a.m .  DVT prophylaxis: Heparin injection  Code Status: FULL  Family Communication: No family bedside Disposition Plan: Discharge home once improved.    Consultants:   None   Procedures:   None   Antimicrobials:   None    Subjective: Feeling better today. Does not have any shortness of breath or complaints of pain.  Objective: Vitals:   09/26/16 0730 09/26/16 0800 09/26/16 0900 09/26/16 1000  BP:  121/67 120/60 104/75  Pulse:      Resp:  (!) 22 (!) 21 (!) 27  Temp: 97.9 F (36.6 C)     TempSrc: Oral     SpO2:      Weight:  Height:        Intake/Output Summary (Last 24 hours) at 09/26/16 1117 Last data filed at 09/26/16 1044  Gross per 24 hour  Intake          1701.07 ml  Output              950 ml  Net           751.07 ml   Filed Weights   09/24/16 2301 09/25/16 0500 09/26/16 0500  Weight: 54.6 kg (120 lb 5.9 oz) 54.6 kg (120 lb 5.9 oz) 55.4 kg (122 lb 2.2 oz)    Examination:  General  exam: Appears calm and comfortable  Respiratory system: Clear to auscultation. Respiratory effort normal. Cardiovascular system: S1 & S2 heard, RRR. No JVD, murmurs, rubs, gallops or clicks. No pedal edema. Gastrointestinal system: Abdomen is nondistended, soft and nontender. No organomegaly or masses felt. Normal bowel sounds heard. Central nervous system: Alert and oriented. No focal neurological deficits. Extremities: Symmetric 5 x 5 power. Skin: No rashes, lesions or ulcers Psychiatry: Awake, alert, cooperative with exam, normal affect    Data Reviewed: I have personally reviewed following labs and imaging studies  CBC:  Recent Labs Lab 09/24/16 1411 09/24/16 1449 09/26/16 0733  WBC 9.5  --  6.9  HGB 14.0 16.0* 13.3  HCT 47.8* 47.0* 38.4  MCV 97.8  --  82.6  PLT 290  --  87*   Basic Metabolic Panel:  Recent Labs Lab 09/24/16 2031 09/25/16 0135 09/25/16 0424 09/25/16 1100 09/26/16 0733  NA 141 148* 147* 145 144  K 3.9 3.0* 3.3* 3.6 2.9*  CL 113* 119* 120* 116* 117*  CO2 7* 16* 18* 21* 21*  GLUCOSE 539* 260* 199* 111* 92  BUN 55* 62* 62* 64* 64*  CREATININE 2.19* 2.02* 1.88* 1.80* 1.76*  CALCIUM 7.5* 7.9* 7.6* 7.9* 8.1*  MG 2.3  --   --   --   --    GFR: Estimated Creatinine Clearance: 28.6 mL/min (by C-G formula based on SCr of 1.76 mg/dL (H)). Liver Function Tests: No results for input(s): AST, ALT, ALKPHOS, BILITOT, PROT, ALBUMIN in the last 168 hours. No results for input(s): LIPASE, AMYLASE in the last 168 hours. No results for input(s): AMMONIA in the last 168 hours. Coagulation Profile:  Recent Labs Lab 09/24/16 1420  INR 1.24   Cardiac Enzymes: No results for input(s): CKTOTAL, CKMB, CKMBINDEX, TROPONINI in the last 168 hours. BNP (last 3 results) No results for input(s): PROBNP in the last 8760 hours. HbA1C: No results for input(s): HGBA1C in the last 72 hours. CBG:  Recent Labs Lab 09/25/16 1635 09/25/16 2103 09/26/16 0532  09/26/16 0546 09/26/16 0802  GLUCAP 93 97 <10* 123* 79   Lipid Profile: No results for input(s): CHOL, HDL, LDLCALC, TRIG, CHOLHDL, LDLDIRECT in the last 72 hours. Thyroid Function Tests: No results for input(s): TSH, T4TOTAL, FREET4, T3FREE, THYROIDAB in the last 72 hours. Anemia Panel: No results for input(s): VITAMINB12, FOLATE, FERRITIN, TIBC, IRON, RETICCTPCT in the last 72 hours. Sepsis Labs:  Recent Labs Lab 09/24/16 1440 09/24/16 2031 09/25/16 0135  LATICACIDVEN 2.85* 2.5* 1.2    Recent Results (from the past 240 hour(s))  Urine culture     Status: Abnormal   Collection Time: 09/24/16  2:27 PM  Result Value Ref Range Status   Specimen Description URINE, CLEAN CATCH  Final   Special Requests NONE  Final   Culture MULTIPLE SPECIES PRESENT, SUGGEST RECOLLECTION (A)  Final  Report Status 09/26/2016 FINAL  Final  Culture, blood (Routine x 2)     Status: None (Preliminary result)   Collection Time: 09/24/16  3:41 PM  Result Value Ref Range Status   Specimen Description BLOOD BLOOD RIGHT ARM  Final   Special Requests BOTTLES DRAWN AEROBIC AND ANAEROBIC 6 CC EACH  Final   Culture NO GROWTH 2 DAYS  Final   Report Status PENDING  Incomplete  Culture, blood (Routine x 2)     Status: None (Preliminary result)   Collection Time: 09/24/16  3:46 PM  Result Value Ref Range Status   Specimen Description BLOOD BLOOD RIGHT HAND  Final   Special Requests BOTTLES DRAWN AEROBIC AND ANAEROBIC 6 CC EACH  Final   Culture NO GROWTH 2 DAYS  Final   Report Status PENDING  Incomplete  MRSA PCR Screening     Status: Abnormal   Collection Time: 09/24/16 10:33 PM  Result Value Ref Range Status   MRSA by PCR POSITIVE (A) NEGATIVE Final    Comment:        The GeneXpert MRSA Assay (FDA approved for NASAL specimens only), is one component of a comprehensive MRSA colonization surveillance program. It is not intended to diagnose MRSA infection nor to guide or monitor treatment for MRSA  infections. RESULT CALLED TO, READ BACK BY AND VERIFIED WITH: Schonewitz,L at 1115am by H Flynt 09/25/16          Radiology Studies: Ct Head Wo Contrast  Result Date: 09/24/2016 CLINICAL DATA:  Altered mental status and hyperglycemia. EXAM: CT HEAD WITHOUT CONTRAST TECHNIQUE: Contiguous axial images were obtained from the base of the skull through the vertex without intravenous contrast. 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 No evidence for acute hemorrhage, mass lesion, midline shift, hydrocephalus or large new infarct. Vascular: No hyperdense vessel or unexpected calcification. Skull: Normal. Negative for fracture or focal lesion. Sinuses/Orbits: Small amount of fluid or mucosal thickening in the maxillary sinuses. Evidence for a previous right maxillary antrostomy. Diffuse mucosal disease in the ethmoid air cells. Other: None. IMPRESSION: No acute intracranial abnormality. 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 Paranasal sinus disease. Electronically Signed   By: Richarda Overlie M.D.   On: 09/24/2016 19:54   Dg Chest Portable 1 View  Result Date: 09/24/2016 CLINICAL DATA:  Altered mental status, hyperglycemia. History of diabetes, schizophrenia. EXAM: PORTABLE CHEST 1 VIEW COMPARISON:  Portable chest x-ray of November 01, 2014 FINDINGS: The lungs are well-expanded. The interstitial markings are coarse. There is no alveolar infiltrate. There is no pleural effusion. The heart is top-normal in size. The pulmonary vascularity is prominent centrally. There is calcification in the wall of the aortic arch. The bony thorax exhibits no acute abnormality. IMPRESSION: There is mild interstitial prominence of both lungs more conspicuous than on previous studies. This may reflect low-grade interstitial edema. An area of confluent density in the right mid lung seen previously is no longer evident. Aortic atherosclerosis. Electronically Signed    By: David  [place] M.D.   On: 09/24/2016 15:12        Scheduled Meds: . albuterol  10 mg Nebulization Once  . ARIPiprazole  15 mg Oral Daily  . atorvastatin  10 mg Oral Daily  . benztropine  1 mg Oral QHS  . buPROPion  75 mg Oral BID  . Chlorhexidine Gluconate Cloth  6 each Topical Q0600  . dextrose      . ferrous sulfate  325 mg Oral Q breakfast  . insulin aspart  0-20 Units Subcutaneous TID WC  . insulin aspart  0-5 Units Subcutaneous QHS  . insulin detemir  20 Units Subcutaneous Daily  . mirabegron ER  50 mg Oral Daily  . mirtazapine  15 mg Oral QHS  . mupirocin ointment  1 application Nasal BID   Continuous Infusions: . 0.45 % NaCl with KCl 20 mEq / L 75 mL/hr at 09/26/16 1044  . insulin (NOVOLIN-R) infusion       LOS: 2 days    Time spent: 25 minutes     Erick Blinks, MD [facility] If 7PM-7AM, please contact night-coverage [email or web address] Password [email or web address] 09/26/2016, 11:17 AM

## 2016-09-26 NOTE — Care Management Note (Signed)
Case Management Note  Patient Details  Name: [name] MRN: [id number] Date of Birth: Jul 15, 1951  Subjective/Objective:                  Pt admitted with DKA. Pt is from home, lives with her husband and is ind with ADL's. She states she has PCP, drives herself to appointments and has no difficulty affording her medications. She ambulates unassisted and has no DME PTA. She has used HH in the past but [facility]. Pt's sister feels pt needs placement. CSW is aware and has seen pt. Pt plans to return home at DC. [facility] Will discuss more with pt at DC.   Action/Plan: Will cont to follow.   Expected Discharge Date:  09/28/16               Expected Discharge Plan:  Home/Self Care (Vs HH)  In-House Referral:  Clinical Social Work  Discharge planning Services  CM Consult   Status of Service:  In process, will continue to follow  Malcolm MetroChildress, Jirah Rider Demske, RN 09/26/2016, 1:18 PM

## 2016-09-26 NOTE — Clinical Social Work Note (Signed)
Clinical Social Work Assessment  Patient Details  Name: Abigail RutherfordDeborah A Carbajal MRN: [id number] Date of Birth: Dec 23, 1950  Date of referral:  09/26/16               Reason for consult:  Discharge Planning, Facility Placement                Permission sought to share information with:    Permission granted to share information::     Name::        Agency::     Relationship::     Contact Information:     Housing/Transportation Living arrangements for the past 2 months:  Skilled Nursing Facility Source of Information:  Patient Patient Interpreter Needed:  None Criminal Activity/Legal Involvement Pertinent to Current Situation/Hospitalization:  No - Comment as needed Significant Relationships:  Spouse Lives with:  Spouse Do you feel safe going back to the place where you live?  Yes Need for family participation in patient care:  Yes (Comment)  Care giving concerns: Patient did not report any care giving needs.    Social Worker assessment / plan: Patient lives with her husband, Kelby FamRichard Lowe. She states that she completes her ADLs unassisted and that she ambulates unassisted.  CSW discussed patient's sisters concerns related to the need for placement. Patient stated "I do not feel I need to go to a facility." She stated that she thinks that her husband will continue to assist her with care giving needs as has been.  Patient stated that that she takes her medications as prescribed, but her sister "probaly said I don't do that either." CSW left facility listing. Patient advised that she was comfortable with going home at discharge and had no intention of going to placement.  CSW signing off.   Employment status:  Disabled (Comment on whether or not currently receiving Disability) Insurance information:  Medicare PT Recommendations:  Not assessed at this time Information / Referral to community resources:  Skilled Nursing Facility  Patient/Family's Response to care: Patient is not agreeable to  go to placement at discharge.  Patient/Family's Understanding of and Emotional Response to Diagnosis, Current Treatment, and Prognosis:  Patient verbalized understanding her her diagnosis, treatment and prognosis.   Emotional Assessment Appearance:  Appears stated age Attitude/Demeanor/Rapport:   (Cooperative) Affect (typically observed):  Accepting Orientation:  Oriented to Self, Oriented to Place, Oriented to Situation (Patient said the year was 1972 but she did know that "Trump" was President.) Alcohol / Substance use:  Not Applicable Psych involvement (Current and /or in the community):  No (Comment)  Discharge Needs  Concerns to be addressed:  No discharge needs identified Readmission within the last 30 days:  No Current discharge risk:  None Barriers to Discharge:  No Barriers Identified   Annice NeedySettle, Betsey Sossamon D, LCSW 09/26/2016, 11:17 AM

## 2016-09-26 NOTE — Progress Notes (Signed)
Inpatient Diabetes Program Recommendations  AACE/ADA: New Consensus Statement on Inpatient Glycemic Control (2015)  Target Ranges:  Prepandial:   less than 140 mg/dL      Peak postprandial:   less than 180 mg/dL (1-2 hours)      Critically ill patients:  140 - 180 mg/dL   Results for Abigail ReiningCHAMPAGNE, Corrissa A (MRN [id number]) as of 09/26/2016 11:49  Ref. Range 09/25/2016 14:27 09/25/2016 15:54 09/25/2016 16:35 09/25/2016 21:03 09/26/2016 05:32 09/26/2016 05:46 09/26/2016 08:02 09/26/2016 11:38  Glucose-Capillary Latest Ref Range: 65 - 99 mg/dL 621156 (H) 308115 (H) 93 97 <65<10 (LL) 123 (H) 79 61 (L)   Review of Glycemic Control   Outpatient Diabetes medications: Levemir 40 units QHS, Humalog 20 units BID with meals, Metformin 1000 mg BID Current orders for Inpatient glycemic control: Levemir 20 units daily, Novolog 0-20 units TID with meals, Novolog 0-5 units QHS  Inpatient Diabetes Program Recommendations: Insulin - Basal: Patient received Levemir 40 units on 09/25/16 at 15:00. Noted hypoglcyemia this morning and current CBG 61 mg/dl. Today Levemir was decreased to 20 units daily and scheduled for 10am today but it was NOT GIVEN due to current CBG of 61 mg/dl. Recommend changing to Lantus 20 units QHS (to be given starting tonight at bedtime). Insulin-Correction: Please consider decreasing Novolog correction to sensitive scale.  Thanks, Orlando PennerMarie Giovannie Scerbo, RN, MSN, CDE Diabetes Coordinator Inpatient Diabetes Program [phone] (Team Pager from 8am to 5pm)

## 2016-09-26 NOTE — Progress Notes (Signed)
Hypoglycemic Event  CBG: 10  Treatment:  D50 IVP   Symptoms: drowsy    Follow-up CBG: Time: 0542 CBG Result: 123  Possible Reasons for Event:  Levemir dosage  Comments/MD notified: Hypoglycemic protocol followed    Faylene MillionSmith, Deola Rewis Allen

## 2016-09-27 DIAGNOSIS — G9340 Encephalopathy, unspecified: Secondary | ICD-10-CM

## 2016-09-27 DIAGNOSIS — E875 Hyperkalemia: Secondary | ICD-10-CM

## 2016-09-27 DIAGNOSIS — E1310 Other specified diabetes mellitus with ketoacidosis without coma: Secondary | ICD-10-CM

## 2016-09-27 LAB — CBC
HEMATOCRIT: 37.1 % (ref 36.0–46.0)
Hemoglobin: 12.5 g/dL (ref 12.0–15.0)
MCH: 28.5 pg (ref 26.0–34.0)
MCHC: 33.7 g/dL (ref 30.0–36.0)
MCV: 84.5 fL (ref 78.0–100.0)
PLATELETS: 75 10*3/uL — AB (ref 150–400)
RBC: 4.39 MIL/uL (ref 3.87–5.11)
RDW: 14.9 % (ref 11.5–15.5)
WBC: 5.8 10*3/uL (ref 4.0–10.5)

## 2016-09-27 LAB — MAGNESIUM: MAGNESIUM: 2 mg/dL (ref 1.7–2.4)

## 2016-09-27 LAB — GLUCOSE, CAPILLARY
GLUCOSE-CAPILLARY: 168 mg/dL — AB (ref 65–99)
Glucose-Capillary: 313 mg/dL — ABNORMAL HIGH (ref 65–99)
Glucose-Capillary: 146 mg/dL — ABNORMAL HIGH (ref 65–99)
Glucose-Capillary: 297 mg/dL — ABNORMAL HIGH (ref 65–99)

## 2016-09-27 LAB — BASIC METABOLIC PANEL
Anion gap: 5 (ref 5–15)
BUN: 49 mg/dL — AB (ref 6–20)
CALCIUM: 8.6 mg/dL — AB (ref 8.9–10.3)
CHLORIDE: 118 mmol/L — AB (ref 101–111)
CO2: 19 mmol/L — ABNORMAL LOW (ref 22–32)
CREATININE: 1.72 mg/dL — AB (ref 0.44–1.00)
GFR calc non Af Amer: 30 mL/min — ABNORMAL LOW (ref >60)
GFR, EST AFRICAN AMERICAN: 35 mL/min — AB (ref >60)
Glucose, Bld: 166 mg/dL — ABNORMAL HIGH (ref 65–99)
Potassium: 4.6 mmol/L (ref 3.5–5.1)
SODIUM: 142 mmol/L (ref 135–145)

## 2016-09-27 MED ORDER — INSULIN DETEMIR 100 UNIT/ML ~~LOC~~ SOLN: 20.0000 [IU] | Freq: Every day | SUBCUTANEOUS | 11 refills | Status: DC

## 2016-09-27 NOTE — Care Management Note (Signed)
Case Management Note  Patient Details  Name: Abigail RutherfordDeborah A Zamora MRN: [id number] Date of Birth: July 10, 1951  Expected Discharge Date:  09/28/16               Expected Discharge Plan:  Home w Home Health Services  In-House Referral:  Clinical Social Work  Discharge planning Services  CM Consult  Post Acute Care Choice:  Home Health Choice offered to:  Patient  DME Arranged:    DME Agency:     HH Arranged:  RN, PT, Social Work [facility]  [facility]  Status of Service:  Completed, signed off   Additional Comments: [facility] has chosen [facility] from list of providers. [facility]. Alroy BailiffLinda Lothian, of [facility], is aware of referral and will obtain pt info from chart.   Malcolm Metrohildress, Savannah Erbe Demske, RN 09/27/2016, 10:55 AM

## 2016-09-27 NOTE — Evaluation (Signed)
Physical Therapy Evaluation Patient Details Name: [name] MRN: [id number] DOB: 02/27/1951 Today's Date: 09/27/2016   History of Present Illness  65 y.o. female with medical history significant for schizophrenia, poor compliance with her medications, and type 2 diabetes mellitus who presents the emergency department after being found poorly responsive and with "high" CBG reading by her husband. Patient had reportedly been in her usual state of health until developing a generalized weakness and lethargy over the past couple days. She reportedly fell in the bathroom last night and then laid on the floor there until this morning. Her husband noted CBG reading to be "high," so he reportedly administered an unknown amount of insulin and later called EMS. By time of EMS arrival, CBG was reportedly 600 and patient was very poorly responsive. She was brought into the ED for further evaluation. Per review of the EMR, the patient hasn't seen her PCP yesterday complaining of dysuria but there was no indication of infection and she was given Pyridium for symptomatic care. A UA was obtained during that visit yesterday and is notable for undetectable high urine ketones and marked glucosuria. She was tachycardic to 113 when she arrived for that visit.  Dx:  DKA in type 2 DM, severe, Hyperkalemia,  Metabolic acidosis     Clinical Impression  Pt received in bed, sister present, and pt is agreeable to PT evaluation.  Pt expressed that PTA, she was independent with gait, ADL's.  Sister was assisting her with managing her medications.  During PT evaluation today, she demonstrates need for Min A for bed mobility, with poor static sitting balance noted by fwd lean where she required cues to correct her posture so she would not fall fwd on to the floor.  She required Mod A for sit<>stand, and Mod A for gait with RW due to running into objects in her path, demonstrating B LE weakness and near buckling.  She required Max  A for stair negotiation - which the sister states she would not be able to do at home.  She is an extremely high risk for recurrent falls due to poor balance and lack of righting response, need for increased assistance with sit<>stands, increased assistance for gait, and quality of gait with shuffling pattern.  She is not safe to d/c home with boyfriend, and she is not safe to d/c home with sister.  She is highly recommended to d/c to SNF due to high fall risk with poor balance, and strength.      Follow Up Recommendations SNF;Supervision/Assistance - 24 hour    Equipment Recommendations  Other (comment) (If pt ends up going home she will need a w/c, RW, BSC)    Recommendations for Other Services       Precautions / Restrictions Precautions Precautions: Fall Precaution Comments: Had a fall on Monday, and laid on the floor for hours, and one other fall.  Restrictions Weight Bearing Restrictions: No      Mobility  Bed Mobility Overal bed mobility: Needs Assistance Bed Mobility: Supine to Sit     Supine to sit: Min assist (increased time, with vc's to get feet planted on the floor.  )     General bed mobility comments: Once pt is sitting on the EOB, she demonstrates poor sitting balance and core strength.  Pt was leaning fwd and required cues for sitting upright due to beginning to loose balance fwd which would cause her to fall fwd on the floor.    Transfers Overall transfer  level: Needs assistance Equipment used: Rolling walker (2 wheeled) Transfers: Sit to/from [employer] Sit to Stand: Min assist (repeatitive vc's for hand placement) Stand pivot transfers: Mod assist (Pt requires increased time, and assistance due to not gettng herself lined up with bed/chair.  When going to sit on the EOB, she required Mod A to prevent her from sitting on the floor)          Ambulation/Gait Ambulation/Gait assistance: Mod assist Ambulation Distance (Feet): 80  Feet Assistive device: Rolling walker (2 wheeled) Gait Pattern/deviations: Shuffle;Trunk flexed     General Gait Details: Pt demonstrates decreased cadence with extreme shuffled gait pattern.  VC's for pt to pick feet up, and she was able to improve B foot clearance, but still dragging toes, and unable to maintain.  Pt demonstrated several episodes of running into objects in the path, as well as several episodes where her legs would almost give way underneath her, and she required cues to stand up.    Stairs Stairs: Yes Stairs assistance: Max assist Stair Management: One rail Right;Forwards Number of Stairs: 3 General stair comments: Steps only attempted due to possibility that pt would d/c home with sister, who has 6 steps to get into her house.  She is not safe to perform stair negotiation except under the direct assistance of a skilled physical therapist due to amount of assistance required.   Sister present for stair training, and expressed that she would not be able to provide that much assistance.   Wheelchair Mobility    Modified Rankin (Stroke Patients Only)       Balance Overall balance assessment: Needs assistance Sitting-balance support: Bilateral upper extremity supported;Feet supported Sitting balance-Leahy Scale: Poor Sitting balance - Comments: fwd lean with near LOB while sitting on the EOB.    Standing balance support: Bilateral upper extremity supported Standing balance-Leahy Scale: Poor                               Pertinent Vitals/Pain Pain Assessment: 0-10 Pain Score: 8  Pain Location: B feet Pain Descriptors / Indicators: Aching Pain Intervention(s): Limited activity within patient's tolerance;Monitored during session;Repositioned    Home Living   Living Arrangements: Spouse/significant other (boyfriend) Available Help at Discharge: Other (Comment) (goes to her sister's house to pick up her weekly medication pill box.  ) Type of Home:  Other(Comment) (travel trailor) Home Access: Stairs to enter   [employer] of Steps: 3 steps - sister states that there are repairs being done to the steps, and they are not safe currently.  Home Layout: One level Home Equipment: None      Prior Function Level of Independence: Independent      ADL's / Homemaking Assistance Needed: pt states that she was still driving, however sister states that she and the boyfriend were concerned about it.         Hand Dominance   Dominant Hand: Right    Extremity/Trunk Assessment   Upper Extremity Assessment: Generalized weakness           Lower Extremity Assessment: Generalized weakness         Communication      Cognition Arousal/Alertness: Awake/alert Behavior During Therapy: WFL for tasks assessed/performed Overall Cognitive Status: Impaired/Different from baseline Area of Impairment: Orientation Orientation Level: Disoriented to;Time (February 1973)                  General Comments  Exercises     Assessment/Plan    PT Assessment Patient needs continued PT services  PT Problem List Decreased strength;Decreased activity tolerance;Decreased balance;Decreased mobility;Decreased coordination;Decreased cognition;Decreased knowledge of use of DME;Decreased safety awareness;Decreased knowledge of precautions;Pain          PT Treatment Interventions DME instruction;Gait training;Functional mobility training;Therapeutic activities;Therapeutic exercise;Balance training;Neuromuscular re-education;Patient/family education;Wheelchair mobility training    PT Goals (Current goals can be found in the Care Plan section)  Acute Rehab PT Goals Patient Stated Goal: Pt wants to get stronger PT Goal Formulation: With patient/family Time For Goal Achievement: 10/04/16 Potential to Achieve Goals: Fair    Frequency Min 4X/week   Barriers to discharge Decreased caregiver support Pt would d/c home with sister,  however sister also cares for their elderly dad.     Co-evaluation               End of Session Equipment Utilized During Treatment: Gait belt Activity Tolerance: Patient limited by fatigue Patient left: in chair;with call bell/phone within reach Nurse Communication: Mobility status Grover Canavan(Krystal, RN notified of PT's recommendations, and need for assistance when mobilizing.  )    Functional Assessment Tool Used: [employer] AM-PAC "6-clicks"  Functional Limitation: Mobility: Walking and moving around Mobility: Walking and Moving Around Current Status [phone](G8978): At least 40 percent but less than 60 percent impaired, limited or restricted Mobility: Walking and Moving Around Goal Status [phone](G8979): At least 20 percent but less than 40 percent impaired, limited or restricted    Time: 1042-1127 PT Time Calculation (min) (ACUTE ONLY): 45 min   Charges:   PT Evaluation $PT Eval Moderate Complexity: 1 Procedure PT Treatments $Gait Training: 8-22 mins $Therapeutic Activity: 8-22 mins   PT G Codes:   PT G-Codes **NOT FOR INPATIENT CLASS** Functional Assessment Tool Used: [employer] AM-PAC "6-clicks"  Functional Limitation: Mobility: Walking and moving around Mobility: Walking and Moving Around Current Status [phone]([phone]): At least 40 percent but less than 60 percent impaired, limited or restricted Mobility: Walking and Moving Around Goal Status [phone](G8979): At least 20 percent but less than 40 percent impaired, limited or restricted    Beth Basem Yannuzzi, PT, DPT X: [phone]

## 2016-09-27 NOTE — Care Management Important Message (Signed)
Important Message  Patient Details  Name: Narda RutherfordDeborah A Cressy MRN: [id number] Date of Birth: September 08, 1951   Medicare Important Message Given:  Yes    Malcolm MetroChildress, Trevone Prestwood Demske, RN 09/27/2016, 10:32 AM

## 2016-09-27 NOTE — Progress Notes (Signed)
1140 Dr.Vega paged and made aware of PT recommendation for patient to have therapy at SNF at this time d/t patient being very weak, unsteady gait, poor core strength and poor safety awareness at this time. Order given per Dr.Vega to change discharge to home order to discharge to SNF order. Tretha SciaraHeather Settle, SW called and made aware and is looking into bed placement at this time. Patient and patient's sister made aware.

## 2016-09-27 NOTE — Clinical Social Work Note (Addendum)
CSW spoke with patient and her sister, Woody SellerCarol Johnson. CSW discussed discharge plan.  Ms. Laural BenesJohnson advised that she would be taking patient home with her. She stated that patient previously stayed with her for six weeks after her stroke. Ms. Laural BenesJohnson stated that patient's living environment was unclean and that she would not send her back to that environment. She stated that patient would be residing in her home located at [address], [place], [place] [address]. She stated that her cell number was [phone] and her home phone number was [phone].  Based on PT eval, patient is not safe to go home. CSW sent clinicals to multiple facilities. [facility]      CSW signing off.       Giavana Rooke, Juleen ChinaHeather D, LCSW

## 2016-09-27 NOTE — Care Management Note (Deleted)
Case Management Note  Patient Details  Name: [name] MRN: [id number] Date of Birth: [birth year]  Expected Discharge Date:  09/28/16               Expected Discharge Plan:  Home/Self Care  In-House Referral:  Clinical Social Work  Discharge planning Services  CM Consult  Status of Service:  Completed, signed off  Additional Comments: Pt discharging home with self care today. No HH services needed.   Malcolm Metrohildress, Kammie Scioli Demske, RN 09/27/2016, 10:34 AM

## 2016-09-27 NOTE — Discharge Summary (Addendum)
Physician Discharge Summary  Abigail RutherfordDeborah A Zamora GEX:[id number] DOB: [birth year] DOA: 09/24/2016  PCP: Loyce DysLONG,ASHLEY B, PA-C  Admit date: 09/24/2016 Discharge date: 09/27/2016  Time spent: > 35 minutes  Recommendations for Outpatient Follow-up:  1. Monitor blood sugars and adjust hypoglycemic agents accordingly 2. Discontinued metformin secondary to serum creatinine   Discharge Diagnoses:  Principal Problem:   Diabetic ketoacidosis associated with type 2 diabetes mellitus (HCC) Active Problems:   Altered mental status   Encephalopathy acute   AKI (acute kidney injury) (HCC)   Chronic systolic congestive heart failure (HCC)   Schizophrenia (HCC)   Hyperkalemia   Hyponatremia   Metabolic acidosis   DKA, type 2 (HCC)   Pressure injury of skin   Thrombocytopenia (HCC)   Discharge Condition: stable  Diet recommendation: carb modified diet  Filed Weights   09/25/16 0500 09/26/16 0500 09/27/16 0500  Weight: 54.6 kg (120 lb 5.9 oz) 55.4 kg (122 lb 2.2 oz) 56.6 kg (124 lb 12.5 oz)    History of present illness:   65 y.o. female with medical history significant for schizophrenia, poor compliance with her medications, and type 2 diabetes mellitus who presents the emergency department after being found poorly responsive and with "high" CBG reading by her husband. Was found to be in DKA  Hospital Course:  DKA - resolved. Will d/c metformin on d/c due to elevated serum creatinine - Pt to use levemir at 20 units SQ qhs. I have recommended she eat 3 times daily, she verbalizes agreement and understanding - Up recommended patient monitor her blood sugars at least 2 times a day fasting and post prandial  Fever - No new complaints reported. I offered the patient noted during the hospital to further investigate refused. Stated he should follow up with primary care physician should she develop another fever. Liposarcomas with a normal limits and her vitals are stable. Chest x-ray does not  report any findings suspicious for infection. Blood culture negative and urine culture inconclusive patient does not report any dysuria  Essential hypertension - Stable off of lisinopril as such will d/c off that medication.   Procedures:  None  Consultations:  None  Discharge Exam: Vitals:   09/27/16 0500 09/27/16 0600  BP: 108/69 111/70  Pulse: 90 91  Resp: (!) 27 (!) 24  Temp:      General: Pt in nad, alert and awake Cardiovascular: rrr, no rubs Respiratory: no increased wob, no wheezes  Discharge Instructions   Discharge Instructions    Call MD for:  difficulty breathing, headache or visual disturbances    Complete by:  As directed    Call MD for:  extreme fatigue    Complete by:  As directed    Call MD for:  temperature >100.4    Complete by:  As directed    Diet - low sodium heart healthy    Complete by:  As directed    Increase activity slowly    Complete by:  As directed      Current Discharge Medication List    CONTINUE these medications which have CHANGED   Details  insulin detemir (LEVEMIR) 100 UNIT/ML injection Inject 0.2 mLs (20 Units total) into the skin at bedtime. Qty: 10 mL, Refills: 11      CONTINUE these medications which have NOT CHANGED   Details  ARIPiprazole (ABILIFY) 15 MG tablet Take 15 mg by mouth daily.    atorvastatin (LIPITOR) 10 MG tablet Take 1 tablet (10 mg total) by mouth daily.  benztropine (COGENTIN) 1 MG tablet Take 1 mg by mouth at bedtime.     buPROPion (WELLBUTRIN) 75 MG tablet Take 75 mg by mouth 2 (two) times daily.    clotrimazole (LOTRIMIN) 1 % cream Apply 1 application topically 2 (two) times daily.    ferrous sulfate 325 (65 FE) MG tablet Take 325 mg by mouth daily with breakfast.    mirabegron ER (MYRBETRIQ) 50 MG TB24 tablet Take 50 mg by mouth daily.    mirtazapine (REMERON) 15 MG tablet Take 15 mg by mouth at bedtime.      STOP taking these medications     Insulin Lispro (HUMALOG KWIKPEN) 200  UNIT/ML SOPN      lisinopril (PRINIVIL,ZESTRIL) 5 MG tablet      metFORMIN (GLUCOPHAGE) 1000 MG tablet      phenazopyridine (PYRIDIUM) 200 MG tablet        Allergies  Allergen Reactions  . Penicillins     Rash       The results of significant diagnostics from this hospitalization (including imaging, microbiology, ancillary and laboratory) are listed below for reference.    Significant Diagnostic Studies: Ct Head Wo Contrast  Result Date: 09/24/2016 CLINICAL DATA:  Altered mental status and hyperglycemia. EXAM: CT HEAD WITHOUT CONTRAST TECHNIQUE: Contiguous axial images were obtained from the base of the skull through the vertex without intravenous contrast. 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 No evidence for acute hemorrhage, mass lesion, midline shift, hydrocephalus or large new infarct. Vascular: No hyperdense vessel or unexpected calcification. Skull: Normal. Negative for fracture or focal lesion. Sinuses/Orbits: Small amount of fluid or mucosal thickening in the maxillary sinuses. Evidence for a previous right maxillary antrostomy. Diffuse mucosal disease in the ethmoid air cells. Other: None. IMPRESSION: No acute intracranial abnormality. 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 Paranasal sinus disease. Electronically Signed   By: Richarda Overlie M.D.   On: 09/24/2016 19:54   Dg Chest Portable 1 View  Result Date: 09/24/2016 CLINICAL DATA:  Altered mental status, hyperglycemia. History of diabetes, schizophrenia. EXAM: PORTABLE CHEST 1 VIEW COMPARISON:  Portable chest x-ray of November 01, 2014 FINDINGS: The lungs are well-expanded. The interstitial markings are coarse. There is no alveolar infiltrate. There is no pleural effusion. The heart is top-normal in size. The pulmonary vascularity is prominent centrally. There is calcification in the wall of the aortic arch. The bony thorax exhibits no acute abnormality.  IMPRESSION: There is mild interstitial prominence of both lungs more conspicuous than on previous studies. This may reflect low-grade interstitial edema. An area of confluent density in the right mid lung seen previously is no longer evident. Aortic atherosclerosis. Electronically Signed   By: David  [place] M.D.   On: 09/24/2016 15:12    Microbiology: Recent Results (from the past 240 hour(s))  Urine culture     Status: Abnormal   Collection Time: 09/24/16  2:27 PM  Result Value Ref Range Status   Specimen Description URINE, CLEAN CATCH  Final   Special Requests NONE  Final   Culture MULTIPLE SPECIES PRESENT, SUGGEST RECOLLECTION (A)  Final   Report Status 09/26/2016 FINAL  Final  Culture, blood (Routine x 2)     Status: None (Preliminary result)   Collection Time: 09/24/16  3:41 PM  Result Value Ref Range Status   Specimen Description BLOOD BLOOD RIGHT ARM  Final   Special Requests BOTTLES DRAWN AEROBIC AND ANAEROBIC 6 CC EACH  Final  Culture NO GROWTH 3 DAYS  Final   Report Status PENDING  Incomplete  Culture, blood (Routine x 2)     Status: None (Preliminary result)   Collection Time: 09/24/16  3:46 PM  Result Value Ref Range Status   Specimen Description BLOOD BLOOD RIGHT HAND  Final   Special Requests BOTTLES DRAWN AEROBIC AND ANAEROBIC 6 CC EACH  Final   Culture NO GROWTH 3 DAYS  Final   Report Status PENDING  Incomplete  MRSA PCR Screening     Status: Abnormal   Collection Time: 09/24/16 10:33 PM  Result Value Ref Range Status   MRSA by PCR POSITIVE (A) NEGATIVE Final    Comment:        The GeneXpert MRSA Assay (FDA approved for NASAL specimens only), is one component of a comprehensive MRSA colonization surveillance program. It is not intended to diagnose MRSA infection nor to guide or monitor treatment for MRSA infections. RESULT CALLED TO, READ BACK BY AND VERIFIED WITH: Schonewitz,L at 1115am by H Flynt 09/25/16      Labs: Basic Metabolic Panel:  Recent  Labs Lab 09/24/16 2031 09/25/16 0135 09/25/16 0424 09/25/16 1100 09/26/16 0733 09/27/16 0518  NA 141 148* 147* 145 144 142  K 3.9 3.0* 3.3* 3.6 2.9* 4.6  CL 113* 119* 120* 116* 117* 118*  CO2 7* 16* 18* 21* 21* 19*  GLUCOSE 539* 260* 199* 111* 92 166*  BUN 55* 62* 62* 64* 64* 49*  CREATININE 2.19* 2.02* 1.88* 1.80* 1.76* 1.72*  CALCIUM 7.5* 7.9* 7.6* 7.9* 8.1* 8.6*  MG 2.3  --   --   --   --  2.0   Liver Function Tests: No results for input(s): AST, ALT, ALKPHOS, BILITOT, PROT, ALBUMIN in the last 168 hours. No results for input(s): LIPASE, AMYLASE in the last 168 hours. No results for input(s): AMMONIA in the last 168 hours. CBC:  Recent Labs Lab 09/24/16 1411 09/24/16 1449 09/26/16 0733 09/27/16 0518  WBC 9.5  --  6.9 5.8  HGB 14.0 16.0* 13.3 12.5  HCT 47.8* 47.0* 38.4 37.1  MCV 97.8  --  82.6 84.5  PLT 290  --  87* 75*   Cardiac Enzymes: No results for input(s): CKTOTAL, CKMB, CKMBINDEX, TROPONINI in the last 168 hours. BNP: BNP (last 3 results) No results for input(s): BNP in the last 8760 hours.  ProBNP (last 3 results) No results for input(s): PROBNP in the last 8760 hours.  CBG:  Recent Labs Lab 09/26/16 1649 09/26/16 2025 09/27/16 0109 09/27/16 0447 09/27/16 0753  GLUCAP 101* 165* 313* 168* 146*    Signed:  Penny PiaVEGA, Natanya Holecek MD.  [facility] 09/27/2016, 10:26 AM   Addendum: Encephalopathy most likely related to DKA and has since resolved.

## 2016-09-27 NOTE — [place] FL2 (Signed)
[place] MEDICAID FL2 LEVEL OF CARE SCREENING TOOL     IDENTIFICATION  Patient Name: [name] Birthdate: Apr 04, 1951 Sex: female Admission Date (Current Location): 09/24/2016  [facility] [place]  [place]   Facility and Address:  [facility],  [address], [place] [id number]      Provider Number: [id number]  Attending Physician Name and Address:  Penny Piarlando Vega, MD  Relative Name and Phone Number:       Current Level of Care: Hospital Recommended Level of Care: Skilled Nursing Facility Prior Approval Number:    Date Approved/Denied:   PASRR Number: [id number] ([id number])  Discharge Plan: SNF    Current Diagnoses: Patient Active Problem List   Diagnosis Date Noted  . Thrombocytopenia (HCC) 09/26/2016  . Pressure injury of skin 09/25/2016  . Hyperkalemia 09/24/2016  . Hyponatremia 09/24/2016  . Metabolic acidosis 09/24/2016  . DKA, type 2 (HCC) 09/24/2016  . Pneumonia due to Staphylococcus (HCC) 11/08/2014  . 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 CVA (cerebral infarction) 11/08/2014  . Chronic systolic congestive heart failure (HCC) 11/08/2014  . Anemia of chronic disease 11/08/2014  . Dysphagia 11/08/2014  . Schizophrenia (HCC) 11/08/2014  . Bacteremia due to Staphylococcus aureus   . Acute respiratory failure (HCC)   . Dehydration   . Community acquired pneumonia   . Anoxic brain injury (HCC)   . Acute respiratory failure with hypoxia (HCC)   . AKI (acute kidney injury) (HCC)   . Septic shock (HCC) 10/02/2014  . Encephalopathy acute 10/02/2014  . Altered mental status 10/01/2014  . Foreign body alimentary tract 10/01/2014  . Shock (HCC)   . Cardiac arrest (HCC)   . Foreign body ingestion   . Diabetes (HCC) 05/22/2014  . Diabetic ketoacidosis associated with type 2 diabetes mellitus (HCC) 05/22/2014  . Constipation 05/22/2014  . Mood disorder (HCC) 05/22/2014    Orientation RESPIRATION BLADDER Height  & Weight     Self, Situation, Place  Normal Continent Weight: 124 lb 12.5 oz (56.6 kg) Height:  5\' 6"  (167.6 cm)  BEHAVIORAL SYMPTOMS/MOOD NEUROLOGICAL BOWEL NUTRITION STATUS      Continent Diet (Heart healthy/carb modified, low sodium heart )  AMBULATORY STATUS COMMUNICATION OF NEEDS Skin   Extensive Assist Verbally PU Stage and Appropriate Care (mid coccyx) PU Stage 1 Dressing:  (foam dressing)                     Personal Care Assistance Level of Assistance  Bathing, Dressing, Feeding Bathing Assistance: Limited assistance Feeding assistance: Independent Dressing Assistance: Limited assistance     Functional Limitations Info  Sight, Hearing, Speech Sight Info: Adequate Hearing Info: Adequate Speech Info: Adequate    SPECIAL CARE FACTORS FREQUENCY  PT (By licensed PT)     PT Frequency: 5x/week              Contractures Contractures Info: Not present    Additional Factors Info  Psychotropic, Isolation Precautions     Psychotropic Info: Abilify, Cogentin, Wellbutrin, Remeron   Isolation Precautions Info: 09/24/2016 MRSA PCR Screen = Positive      Current Medications (09/27/2016):  This is the current hospital active medication list Current Facility-Administered Medications  Medication Dose Route Frequency Provider Last Rate Last Dose  . acetaminophen (TYLENOL) tablet 650 mg  650 mg Oral Q6H PRN Erick BlinksJehanzeb Memon, MD   650 mg at 09/26/16 1519  . albuterol (PROVENTIL) (2.5 MG/3ML) 0.083% nebulizer solution 10 mg  10 mg Nebulization Once  Mancel BaleElliott Wentz, MD   Stopped at 09/24/16 1749  . ARIPiprazole (ABILIFY) tablet 15 mg  15 mg Oral Daily Briscoe Deutscherimothy S Opyd, MD   15 mg at 09/27/16 0918  . atorvastatin (LIPITOR) tablet 10 mg  10 mg Oral Daily Briscoe Deutscherimothy S Opyd, MD   10 mg at 09/27/16 0918  . benztropine (COGENTIN) tablet 1 mg  1 mg Oral QHS Briscoe Deutscherimothy S Opyd, MD   1 mg at 09/26/16 2230  . buPROPion [facility](WELLBUTRIN) tablet 75 mg  75 mg Oral BID Briscoe Deutscherimothy S Opyd, MD   75 mg at 09/27/16  0919  . Chlorhexidine Gluconate Cloth 2 % PADS 6 each  6 each Topical Q0600 Erick BlinksJehanzeb Memon, MD   6 each at 09/25/16 1130  . ferrous sulfate tablet 325 mg  325 mg Oral Q breakfast Briscoe Deutscherimothy S Opyd, MD   325 mg at 09/27/16 0826  . insulin aspart (novoLOG) injection 0-5 Units  0-5 Units Subcutaneous QHS Erick BlinksJehanzeb Memon, MD      . insulin aspart (novoLOG) injection 0-9 Units  0-9 Units Subcutaneous TID WC Erick BlinksJehanzeb Memon, MD   1 Units at 09/27/16 0826  . insulin detemir (LEVEMIR) injection 20 Units  20 Units Subcutaneous QHS Erick BlinksJehanzeb Memon, MD      . mirabegron ER (MYRBETRIQ) tablet 50 mg  50 mg Oral Daily Briscoe Deutscherimothy S Opyd, MD   50 mg at 09/27/16 0919  . mirtazapine (REMERON) tablet 15 mg  15 mg Oral QHS Briscoe Deutscherimothy S Opyd, MD   15 mg at 09/26/16 2230  . mupirocin ointment (BACTROBAN) 2 % 1 application  1 application Nasal BID Erick BlinksJehanzeb Memon, MD   1 application at 09/27/16 16100826     Discharge Medications: Please see discharge summary for a list of discharge medications.  Relevant Imaging Results:  Relevant Lab Results:   Additional Information SSN [id number][id number]  Gabbriella Presswood, Juleen ChinaHeather D, LCSW

## 2016-09-27 NOTE — Progress Notes (Signed)
1311 [facility] [place]. All IV catheters removed from patient, intact with no s/s of infection/infiltration noted. Foley catheter removed from patient and patient voided 100cc of straw yellow urine. [facility] called and report given to nurse Ander Slade(Joy) regarding patient's history and current status. Patient and patient's sister aware.

## 2016-09-27 NOTE — Clinical Social Work Placement (Signed)
   CLINICAL SOCIAL WORK PLACEMENT  NOTE  Date:  09/27/2016  Patient Details  Name: Abigail RutherfordDeborah A Houde MRN: [id number] Date of Birth: [birth year]  Clinical Social Work is seeking post-discharge placement for this patient at the Skilled  Nursing Facility level of care (*CSW will initial, date and re-position this form in  chart as items are completed):  Yes   Patient/family provided with [facility] Clinical Social Work Department's list of facilities offering this level of care within the geographic area requested by the patient (or if unable, by the patient's family).  Yes   Patient/family informed of their freedom to choose among providers that offer the needed level of care, that participate in Medicare, Medicaid or managed care program needed by the patient, have an available bed and are willing to accept the patient.  Yes   Patient/family informed of [facility]'s ownership interest in [facility] and [facility], as well as of the fact that they are under no obligation to receive care at these facilities.  PASRR submitted to EDS on       PASRR number received on       Existing PASRR number confirmed on 09/27/16     FL2 transmitted to all facilities in geographic area requested by pt/family on 09/27/16     FL2 transmitted to all facilities within larger geographic area on       Patient informed that his/her managed care company has contracts with or will negotiate with certain facilities, including the following:        Yes   Patient/family informed of bed offers received.  [facility]     Physician recommends and patient chooses bed at      [facility].  Patient to be transferred to facility by [facility]     Patient family notified on 09/27/16 of transfer.  Name of family member notified:  Woody SellerCarol Johnson, sister     PHYSICIAN       Additional Comment:  CSW signing off.    _______________________________________________ Annice NeedySettle, Reegan Mctighe D, LCSW 09/27/2016, 12:34 PM

## 2016-09-29 LAB — CULTURE, BLOOD (ROUTINE X 2)
CULTURE: NO GROWTH
Culture: NO GROWTH

## 2017-04-15 ENCOUNTER — Emergency Department (HOSPITAL_COMMUNITY): Payer: Medicare Other

## 2017-04-15 ENCOUNTER — Inpatient Hospital Stay (HOSPITAL_COMMUNITY): Payer: Medicare Other

## 2017-04-15 ENCOUNTER — Encounter (HOSPITAL_COMMUNITY): Payer: Self-pay | Admitting: Emergency Medicine

## 2017-04-15 ENCOUNTER — Inpatient Hospital Stay (HOSPITAL_COMMUNITY)
Admission: EM | Admit: 2017-04-15 | Discharge: 2017-04-25 | DRG: 871 | Disposition: A | Discharge status: Skilled Nursing Facility | Payer: Medicare Other | Attending: Family Medicine | Admitting: Family Medicine

## 2017-04-15 DIAGNOSIS — N390 Urinary tract infection, site not specified: Secondary | ICD-10-CM | POA: Diagnosis present

## 2017-04-15 DIAGNOSIS — G9341 Metabolic encephalopathy: Secondary | ICD-10-CM | POA: Diagnosis present

## 2017-04-15 DIAGNOSIS — E1011 Type 1 diabetes mellitus with ketoacidosis with coma: Secondary | ICD-10-CM

## 2017-04-15 DIAGNOSIS — N170 Acute kidney failure with tubular necrosis: Secondary | ICD-10-CM | POA: Diagnosis not present

## 2017-04-15 DIAGNOSIS — R571 Hypovolemic shock: Secondary | ICD-10-CM | POA: Diagnosis not present

## 2017-04-15 DIAGNOSIS — J154 Pneumonia due to other streptococci: Secondary | ICD-10-CM | POA: Diagnosis present

## 2017-04-15 DIAGNOSIS — R402113 Coma scale, eyes open, never, at hospital admission: Secondary | ICD-10-CM | POA: Diagnosis present

## 2017-04-15 DIAGNOSIS — Z833 Family history of diabetes mellitus: Secondary | ICD-10-CM

## 2017-04-15 DIAGNOSIS — I5022 Chronic systolic (congestive) heart failure: Secondary | ICD-10-CM | POA: Diagnosis present

## 2017-04-15 DIAGNOSIS — Z8673 Personal history of transient ischemic attack (TIA), and cerebral infarction without residual deficits: Secondary | ICD-10-CM

## 2017-04-15 DIAGNOSIS — Z978 Presence of other specified devices: Secondary | ICD-10-CM

## 2017-04-15 DIAGNOSIS — I130 Hypertensive heart and chronic kidney disease with heart failure and stage 1 through stage 4 chronic kidney disease, or unspecified chronic kidney disease: Secondary | ICD-10-CM | POA: Diagnosis present

## 2017-04-15 DIAGNOSIS — R079 Chest pain, unspecified: Secondary | ICD-10-CM

## 2017-04-15 DIAGNOSIS — R402213 Coma scale, best verbal response, none, at hospital admission: Secondary | ICD-10-CM | POA: Diagnosis present

## 2017-04-15 DIAGNOSIS — I469 Cardiac arrest, cause unspecified: Secondary | ICD-10-CM | POA: Diagnosis present

## 2017-04-15 DIAGNOSIS — E1111 Type 2 diabetes mellitus with ketoacidosis with coma: Secondary | ICD-10-CM | POA: Diagnosis not present

## 2017-04-15 DIAGNOSIS — J9601 Acute respiratory failure with hypoxia: Secondary | ICD-10-CM | POA: Diagnosis present

## 2017-04-15 DIAGNOSIS — D638 Anemia in other chronic diseases classified elsewhere: Secondary | ICD-10-CM | POA: Diagnosis present

## 2017-04-15 DIAGNOSIS — E1122 Type 2 diabetes mellitus with diabetic chronic kidney disease: Secondary | ICD-10-CM | POA: Diagnosis present

## 2017-04-15 DIAGNOSIS — A419 Sepsis, unspecified organism: Principal | ICD-10-CM | POA: Diagnosis present

## 2017-04-15 DIAGNOSIS — G931 Anoxic brain damage, not elsewhere classified: Secondary | ICD-10-CM | POA: Diagnosis present

## 2017-04-15 DIAGNOSIS — R4182 Altered mental status, unspecified: Secondary | ICD-10-CM | POA: Diagnosis present

## 2017-04-15 DIAGNOSIS — J9602 Acute respiratory failure with hypercapnia: Secondary | ICD-10-CM | POA: Diagnosis present

## 2017-04-15 DIAGNOSIS — I5023 Acute on chronic systolic (congestive) heart failure: Secondary | ICD-10-CM | POA: Diagnosis not present

## 2017-04-15 DIAGNOSIS — N3946 Mixed incontinence: Secondary | ICD-10-CM | POA: Diagnosis present

## 2017-04-15 DIAGNOSIS — R402313 Coma scale, best motor response, none, at hospital admission: Secondary | ICD-10-CM | POA: Diagnosis present

## 2017-04-15 DIAGNOSIS — F259 Schizoaffective disorder, unspecified: Secondary | ICD-10-CM | POA: Diagnosis present

## 2017-04-15 DIAGNOSIS — F209 Schizophrenia, unspecified: Secondary | ICD-10-CM | POA: Diagnosis present

## 2017-04-15 DIAGNOSIS — E872 Acidosis: Secondary | ICD-10-CM | POA: Diagnosis not present

## 2017-04-15 DIAGNOSIS — Z823 Family history of stroke: Secondary | ICD-10-CM | POA: Diagnosis not present

## 2017-04-15 DIAGNOSIS — E875 Hyperkalemia: Secondary | ICD-10-CM | POA: Diagnosis present

## 2017-04-15 DIAGNOSIS — J9690 Respiratory failure, unspecified, unspecified whether with hypoxia or hypercapnia: Secondary | ICD-10-CM

## 2017-04-15 DIAGNOSIS — Z8249 Family history of ischemic heart disease and other diseases of the circulatory system: Secondary | ICD-10-CM

## 2017-04-15 DIAGNOSIS — N183 Chronic kidney disease, stage 3 (moderate): Secondary | ICD-10-CM | POA: Diagnosis present

## 2017-04-15 DIAGNOSIS — R6521 Severe sepsis with septic shock: Secondary | ICD-10-CM | POA: Diagnosis present

## 2017-04-15 DIAGNOSIS — Z4659 Encounter for fitting and adjustment of other gastrointestinal appliance and device: Secondary | ICD-10-CM

## 2017-04-15 DIAGNOSIS — E1110 Type 2 diabetes mellitus with ketoacidosis without coma: Secondary | ICD-10-CM | POA: Diagnosis present

## 2017-04-15 DIAGNOSIS — N179 Acute kidney failure, unspecified: Secondary | ICD-10-CM | POA: Diagnosis not present

## 2017-04-15 DIAGNOSIS — F319 Bipolar disorder, unspecified: Secondary | ICD-10-CM | POA: Diagnosis present

## 2017-04-15 DIAGNOSIS — Z87891 Personal history of nicotine dependence: Secondary | ICD-10-CM

## 2017-04-15 DIAGNOSIS — I350 Nonrheumatic aortic (valve) stenosis: Secondary | ICD-10-CM | POA: Diagnosis not present

## 2017-04-15 DIAGNOSIS — E876 Hypokalemia: Secondary | ICD-10-CM | POA: Diagnosis not present

## 2017-04-15 DIAGNOSIS — I959 Hypotension, unspecified: Secondary | ICD-10-CM | POA: Diagnosis present

## 2017-04-15 DIAGNOSIS — Z794 Long term (current) use of insulin: Secondary | ICD-10-CM

## 2017-04-15 DIAGNOSIS — J9600 Acute respiratory failure, unspecified whether with hypoxia or hypercapnia: Secondary | ICD-10-CM | POA: Diagnosis not present

## 2017-04-15 DIAGNOSIS — D696 Thrombocytopenia, unspecified: Secondary | ICD-10-CM | POA: Diagnosis not present

## 2017-04-15 DIAGNOSIS — R0902 Hypoxemia: Secondary | ICD-10-CM

## 2017-04-15 DIAGNOSIS — I9589 Other hypotension: Secondary | ICD-10-CM | POA: Diagnosis not present

## 2017-04-15 DIAGNOSIS — Z880 Allergy status to penicillin: Secondary | ICD-10-CM

## 2017-04-15 DIAGNOSIS — E0810 Diabetes mellitus due to underlying condition with ketoacidosis without coma: Secondary | ICD-10-CM | POA: Diagnosis not present

## 2017-04-15 DIAGNOSIS — J811 Chronic pulmonary edema: Secondary | ICD-10-CM

## 2017-04-15 DIAGNOSIS — Z825 Family history of asthma and other chronic lower respiratory diseases: Secondary | ICD-10-CM

## 2017-04-15 HISTORY — DX: Pneumonia, unspecified organism: J18.9

## 2017-04-15 LAB — URINALYSIS, ROUTINE W REFLEX MICROSCOPIC
Bilirubin Urine: NEGATIVE
Ketones, ur: 80 mg/dL — AB
LEUKOCYTES UA: NEGATIVE
NITRITE: NEGATIVE
PH: 5 (ref 5.0–8.0)
Protein, ur: 100 mg/dL — AB
SPECIFIC GRAVITY, URINE: 1.016 (ref 1.005–1.030)
Squamous Epithelial / LPF: NONE SEEN

## 2017-04-15 LAB — CBC WITH DIFFERENTIAL/PLATELET
BASOS ABS: 0.1 10*3/uL (ref 0.0–0.1)
Basophils Relative: 1 %
EOS ABS: 0 10*3/uL (ref 0.0–0.7)
Eosinophils Relative: 0 %
HEMATOCRIT: 44.7 % (ref 36.0–46.0)
Hemoglobin: 13.7 g/dL (ref 12.0–15.0)
LYMPHS ABS: 0.7 10*3/uL (ref 0.7–4.0)
LYMPHS PCT: 5 %
MCH: 26.9 pg (ref 26.0–34.0)
MCHC: 30.6 g/dL (ref 30.0–36.0)
MCV: 87.8 fL (ref 78.0–100.0)
MONOS PCT: 12 %
Monocytes Absolute: 1.7 10*3/uL — ABNORMAL HIGH (ref 0.1–1.0)
NEUTROS ABS: 11.9 10*3/uL — AB (ref 1.7–7.7)
Neutrophils Relative %: 82 %
Platelets: 323 10*3/uL (ref 150–400)
RBC: 5.09 MIL/uL (ref 3.87–5.11)
RDW: 17.3 % — AB (ref 11.5–15.5)
WBC Morphology: INCREASED
WBC: 14.4 10*3/uL — AB (ref 4.0–10.5)

## 2017-04-15 LAB — I-STAT CHEM 8, ED
BUN: 26 mg/dL — AB (ref 6–20)
CHLORIDE: 114 mmol/L — AB (ref 101–111)
CREATININE: 1.5 mg/dL — AB (ref 0.44–1.00)
Calcium, Ion: 1.35 mmol/L (ref 1.15–1.40)
Glucose, Bld: 589 mg/dL (ref 65–99)
HEMATOCRIT: 44 % (ref 36.0–46.0)
Hemoglobin: 15 g/dL (ref 12.0–15.0)
POTASSIUM: 5.5 mmol/L — AB (ref 3.5–5.1)
Sodium: 136 mmol/L (ref 135–145)
TCO2: 6 mmol/L (ref 0–100)

## 2017-04-15 LAB — COMPREHENSIVE METABOLIC PANEL
ALK PHOS: 141 U/L — AB (ref 38–126)
ALT: 15 U/L (ref 14–54)
AST: 16 U/L (ref 15–41)
Albumin: 3 g/dL — ABNORMAL LOW (ref 3.5–5.0)
BUN: 23 mg/dL — AB (ref 6–20)
CALCIUM: 9.1 mg/dL (ref 8.9–10.3)
CO2: <7 mmol/L — ABNORMAL LOW (ref 22–32)
Chloride: 107 mmol/L (ref 101–111)
Creatinine, Ser: 2.34 mg/dL — ABNORMAL HIGH (ref 0.44–1.00)
GFR calc Af Amer: 24 mL/min — ABNORMAL LOW (ref >60)
GFR, EST NON AFRICAN AMERICAN: 21 mL/min — AB (ref >60)
GLUCOSE: 587 mg/dL — AB (ref 65–99)
POTASSIUM: 5.5 mmol/L — AB (ref 3.5–5.1)
Sodium: 135 mmol/L (ref 135–145)
Total Bilirubin: 1.6 mg/dL — ABNORMAL HIGH (ref 0.3–1.2)
Total Protein: 6.9 g/dL (ref 6.5–8.1)

## 2017-04-15 LAB — MRSA PCR SCREENING: MRSA by PCR: NEGATIVE

## 2017-04-15 LAB — BASIC METABOLIC PANEL
BUN: 19 mg/dL (ref 6–20)
CO2: <7 mmol/L — ABNORMAL LOW (ref 22–32)
CREATININE: 1.4 mg/dL — AB (ref 0.44–1.00)
Calcium: 5.3 mg/dL — CL (ref 8.9–10.3)
Chloride: 123 mmol/L — ABNORMAL HIGH (ref 101–111)
GFR calc Af Amer: 45 mL/min — ABNORMAL LOW (ref >60)
GFR, EST NON AFRICAN AMERICAN: 39 mL/min — AB (ref >60)
Glucose, Bld: 418 mg/dL — ABNORMAL HIGH (ref 65–99)
POTASSIUM: 2.8 mmol/L — AB (ref 3.5–5.1)
SODIUM: 143 mmol/L (ref 135–145)

## 2017-04-15 LAB — POCT I-STAT 3, ART BLOOD GAS (G3+)
ACID-BASE DEFICIT: 26 mmol/L — AB (ref 0.0–2.0)
Bicarbonate: 5.5 mmol/L — ABNORMAL LOW (ref 20.0–28.0)
O2 SAT: 78 %
PH ART: 6.947 — AB (ref 7.350–7.450)
PO2 ART: 64 mmHg — AB (ref 83.0–108.0)
TCO2: 6 mmol/L (ref 0–100)
pCO2 arterial: 25.2 mmHg — ABNORMAL LOW (ref 32.0–48.0)

## 2017-04-15 LAB — CBG MONITORING, ED: GLUCOSE-CAPILLARY: 525 mg/dL — AB (ref 65–99)

## 2017-04-15 LAB — GLUCOSE, CAPILLARY
GLUCOSE-CAPILLARY: 530 mg/dL — AB (ref 65–99)
GLUCOSE-CAPILLARY: 465 mg/dL — AB (ref 65–99)
Glucose-Capillary: 514 mg/dL (ref 65–99)
Glucose-Capillary: 497 mg/dL — ABNORMAL HIGH (ref 65–99)
Glucose-Capillary: 494 mg/dL — ABNORMAL HIGH (ref 65–99)
Glucose-Capillary: 424 mg/dL — ABNORMAL HIGH (ref 65–99)
Glucose-Capillary: 388 mg/dL — ABNORMAL HIGH (ref 65–99)

## 2017-04-15 LAB — LACTIC ACID, PLASMA
Lactic Acid, Venous: 1 mmol/L (ref 0.5–1.9)
Lactic Acid, Venous: 2.7 mmol/L (ref 0.5–1.9)

## 2017-04-15 LAB — BETA-HYDROXYBUTYRIC ACID: Beta-Hydroxybutyric Acid: >8 mmol/L — ABNORMAL HIGH (ref 0.05–0.27)

## 2017-04-15 LAB — I-STAT CG4 LACTIC ACID, ED: Lactic Acid, Venous: 2.23 mmol/L (ref 0.5–1.9)

## 2017-04-15 LAB — PHOSPHORUS: Phosphorus: 4.2 mg/dL (ref 2.5–4.6)

## 2017-04-15 LAB — CORTISOL: CORTISOL PLASMA: 53.8 ug/dL

## 2017-04-15 LAB — MAGNESIUM: MAGNESIUM: 1.3 mg/dL — AB (ref 1.7–2.4)

## 2017-04-15 LAB — CG4 I-STAT (LACTIC ACID): LACTIC ACID, VENOUS: 0.81 mmol/L (ref 0.5–1.9)

## 2017-04-15 LAB — TSH: TSH: 1.341 u[IU]/mL (ref 0.350–4.500)

## 2017-04-15 MED ORDER — SODIUM BICARBONATE 8.4 % IV SOLN
100.0000 meq | Freq: Once | INTRAVENOUS | Status: AC
  Administered 2017-04-15: 100 meq via INTRAVENOUS
  Filled 2017-04-15: qty 100

## 2017-04-15 MED ORDER — VANCOMYCIN HCL IN DEXTROSE 1-5 GM/200ML-% IV SOLN: 1000.0000 mg | INTRAVENOUS | Status: DC

## 2017-04-15 MED ORDER — ORAL CARE MOUTH RINSE
15.0000 mL | OROMUCOSAL | Status: DC
  Administered 2017-04-15 – 2017-04-18 (×24): 15 mL via OROMUCOSAL

## 2017-04-15 MED ORDER — DEXTROSE 5 % IV SOLN
0.0000 ug/min | INTRAVENOUS | Status: DC
  Administered 2017-04-15: 30 ug/min via INTRAVENOUS
  Filled 2017-04-15 (×2): qty 16

## 2017-04-15 MED ORDER — SODIUM CHLORIDE 0.9 % IV BOLUS (SEPSIS)
1000.0000 mL | Freq: Once | INTRAVENOUS | Status: AC
  Administered 2017-04-15: 1000 mL via INTRAVENOUS

## 2017-04-15 MED ORDER — SODIUM CHLORIDE 0.9 % IV SOLN
INTRAVENOUS | Status: AC
  Administered 2017-04-15: 13:00:00 via INTRAVENOUS

## 2017-04-15 MED ORDER — SODIUM CHLORIDE 0.9 % IV BOLUS (SEPSIS): 1000.0000 mL | Freq: Once | INTRAVENOUS | Status: DC

## 2017-04-15 MED ORDER — EPINEPHRINE PF 1 MG/10ML IJ SOSY
PREFILLED_SYRINGE | INTRAMUSCULAR | Status: DC | PRN
  Administered 2017-04-15: 1 mg via INTRAVENOUS

## 2017-04-15 MED ORDER — FENTANYL CITRATE (PF) 100 MCG/2ML IJ SOLN
INTRAMUSCULAR | Status: AC
  Filled 2017-04-15: qty 2

## 2017-04-15 MED ORDER — ATROPINE SULFATE 1 MG/ML IJ SOLN
INTRAMUSCULAR | Status: DC | PRN
  Administered 2017-04-15: 1 mg via INTRAVENOUS

## 2017-04-15 MED ORDER — HEPARIN SODIUM (PORCINE) 5000 UNIT/ML IJ SOLN
5000.0000 [IU] | Freq: Three times a day (TID) | INTRAMUSCULAR | Status: DC
  Administered 2017-04-15 – 2017-04-25 (×30): 5000 [IU] via SUBCUTANEOUS
  Filled 2017-04-15 (×29): qty 1

## 2017-04-15 MED ORDER — SODIUM CHLORIDE 0.9 % IV SOLN
INTRAVENOUS | Status: DC
  Administered 2017-04-15: 4.5 [IU]/h via INTRAVENOUS
  Administered 2017-04-16: 12 [IU]/h via INTRAVENOUS
  Filled 2017-04-15 (×2): qty 1

## 2017-04-15 MED ORDER — SODIUM BICARBONATE 8.4 % IV SOLN
INTRAVENOUS | Status: DC | PRN
  Administered 2017-04-15: 50 meq via INTRAVENOUS

## 2017-04-15 MED ORDER — SODIUM CHLORIDE 0.9 % IV SOLN
INTRAVENOUS | Status: DC
  Administered 2017-04-16: 02:00:00 via INTRAVENOUS

## 2017-04-15 MED ORDER — SODIUM CHLORIDE 0.9 % IV SOLN
INTRAVENOUS | Status: DC
  Filled 2017-04-15: qty 1

## 2017-04-15 MED ORDER — SODIUM BICARBONATE 8.4 % IV SOLN
50.0000 meq | Freq: Once | INTRAVENOUS | Status: AC
  Administered 2017-04-15: 50 meq via INTRAVENOUS
  Filled 2017-04-15: qty 50

## 2017-04-15 MED ORDER — DEXTROSE-NACL 5-0.45 % IV SOLN: INTRAVENOUS | Status: DC

## 2017-04-15 MED ORDER — DEXTROSE 5 % IV SOLN
2.0000 g | Freq: Once | INTRAVENOUS | Status: AC
  Administered 2017-04-15: 2 g via INTRAVENOUS
  Filled 2017-04-15: qty 2

## 2017-04-15 MED ORDER — NOREPINEPHRINE BITARTRATE 1 MG/ML IV SOLN
0.0000 ug/min | INTRAVENOUS | Status: DC
  Administered 2017-04-15 (×3): 30 ug/min via INTRAVENOUS
  Filled 2017-04-15 (×3): qty 4

## 2017-04-15 MED ORDER — VASOPRESSIN 20 UNIT/ML IV SOLN
0.0300 [IU]/min | INTRAVENOUS | Status: DC
  Administered 2017-04-15 – 2017-04-17 (×2): 0.03 [IU]/min via INTRAVENOUS
  Filled 2017-04-15 (×4): qty 2

## 2017-04-15 MED ORDER — FENTANYL 2500MCG IN NS 250ML (10MCG/ML) PREMIX INFUSION
25.0000 ug/h | INTRAVENOUS | Status: DC
Start: 2017-04-15 — End: 2017-04-18
  Administered 2017-04-15: 50 ug/h via INTRAVENOUS
  Administered 2017-04-17: 75 ug/h via INTRAVENOUS
  Filled 2017-04-15 (×2): qty 250

## 2017-04-15 MED ORDER — FENTANYL CITRATE (PF) 100 MCG/2ML IJ SOLN
100.0000 ug | INTRAMUSCULAR | Status: DC | PRN
  Administered 2017-04-15: 100 ug via INTRAVENOUS
  Filled 2017-04-15: qty 2

## 2017-04-15 MED ORDER — MIDAZOLAM HCL 2 MG/2ML IJ SOLN
2.0000 mg | INTRAMUSCULAR | Status: DC | PRN
  Administered 2017-04-15: 2 mg via INTRAVENOUS
  Filled 2017-04-15: qty 2

## 2017-04-15 MED ORDER — VANCOMYCIN HCL IN DEXTROSE 1-5 GM/200ML-% IV SOLN: 1000.0000 mg | Freq: Once | INTRAVENOUS | Status: DC

## 2017-04-15 MED ORDER — CALCIUM CHLORIDE 10 % IV SOLN
INTRAVENOUS | Status: DC | PRN
  Administered 2017-04-15: 1 g via INTRAVENOUS

## 2017-04-15 MED ORDER — PANTOPRAZOLE SODIUM 40 MG IV SOLR
40.0000 mg | INTRAVENOUS | Status: DC
  Administered 2017-04-15 – 2017-04-18 (×4): 40 mg via INTRAVENOUS
  Filled 2017-04-15 (×4): qty 40

## 2017-04-15 MED ORDER — SODIUM CHLORIDE 0.9 % IV BOLUS (SEPSIS): 250.0000 mL | Freq: Once | INTRAVENOUS | Status: DC

## 2017-04-15 MED ORDER — ROCURONIUM BROMIDE 50 MG/5ML IV SOLN
INTRAVENOUS | Status: DC | PRN
  Administered 2017-04-15 (×2): 50 mg via INTRAVENOUS

## 2017-04-15 MED ORDER — SODIUM CHLORIDE 0.9 % IV BOLUS (SEPSIS): 500.0000 mL | Freq: Once | INTRAVENOUS | Status: DC

## 2017-04-15 MED ORDER — LEVOFLOXACIN IN D5W 750 MG/150ML IV SOLN: 750.0000 mg | INTRAVENOUS | Status: DC

## 2017-04-15 MED ORDER — VANCOMYCIN HCL IN DEXTROSE 1-5 GM/200ML-% IV SOLN
1000.0000 mg | INTRAVENOUS | Status: DC
  Administered 2017-04-15: 1000 mg via INTRAVENOUS
  Filled 2017-04-15 (×2): qty 200

## 2017-04-15 MED ORDER — FENTANYL CITRATE (PF) 100 MCG/2ML IJ SOLN
INTRAMUSCULAR | Status: DC | PRN
  Administered 2017-04-15: 50 ug via INTRAVENOUS

## 2017-04-15 MED ORDER — DEXTROSE 5 % IV SOLN: 1.0000 g | Freq: Three times a day (TID) | INTRAVENOUS | Status: DC

## 2017-04-15 MED ORDER — PHENYLEPHRINE 40 MCG/ML (10ML) SYRINGE FOR IV PUSH (FOR BLOOD PRESSURE SUPPORT)
PREFILLED_SYRINGE | INTRAVENOUS | Status: AC
  Filled 2017-04-15: qty 10

## 2017-04-15 MED ORDER — SODIUM CHLORIDE 0.9 % IV BOLUS (SEPSIS)
30.0000 mL/kg | Freq: Once | INTRAVENOUS | Status: AC
  Administered 2017-04-15: 1800 mL via INTRAVENOUS

## 2017-04-15 MED ORDER — STERILE WATER FOR INJECTION IV SOLN
INTRAVENOUS | Status: DC
  Administered 2017-04-15 – 2017-04-17 (×6): via INTRAVENOUS
  Filled 2017-04-15 (×10): qty 850

## 2017-04-15 MED ORDER — CHLORHEXIDINE GLUCONATE 0.12% ORAL RINSE (MEDLINE KIT)
15.0000 mL | Freq: Two times a day (BID) | OROMUCOSAL | Status: DC
  Administered 2017-04-16 – 2017-04-17 (×4): 15 mL via OROMUCOSAL

## 2017-04-15 MED ORDER — CHLORHEXIDINE GLUCONATE 0.12% ORAL RINSE (MEDLINE KIT)
15.0000 mL | Freq: Two times a day (BID) | OROMUCOSAL | Status: DC
  Administered 2017-04-15 (×2): 15 mL via OROMUCOSAL

## 2017-04-15 MED ORDER — POTASSIUM CHLORIDE 10 MEQ/100ML IV SOLN
10.0000 meq | INTRAVENOUS | Status: AC
  Administered 2017-04-15 – 2017-04-16 (×6): 10 meq via INTRAVENOUS
  Filled 2017-04-15 (×6): qty 100

## 2017-04-15 MED ORDER — ORAL CARE MOUTH RINSE
15.0000 mL | Freq: Four times a day (QID) | OROMUCOSAL | Status: DC
  Administered 2017-04-15: 15 mL via OROMUCOSAL

## 2017-04-15 MED ORDER — LEVOFLOXACIN IN D5W 750 MG/150ML IV SOLN: 750.0000 mg | Freq: Once | INTRAVENOUS | Status: DC

## 2017-04-15 MED ORDER — DEXTROSE 5 % IV SOLN
2.0000 g | INTRAVENOUS | Status: DC
  Administered 2017-04-15: 2 g via INTRAVENOUS
  Filled 2017-04-15: qty 2

## 2017-04-15 NOTE — Progress Notes (Signed)
eLink Physician-Brief Progress Note Patient Name: Abigail RutherfordDeborah A Dodgen DOB: 28-Nov-1950 MRN: [id number]   Date of Service  04/15/2017  HPI/Events of Note  Still tachycardic, cvp 11 with some of autopeep corrected but tending to air stack when tries to breath over vent   Intake/Output Summary (Last 24 hours) at 04/15/17 2058 Last data filed at 04/15/17 2000  Gross per 24 hour  Intake         12128.51 ml  Output              235 ml  Net         11893.51 ml     eICU Interventions  Fentanyl drip to control RR/ fluid bolus x 1 liter     Intervention Category Major Interventions: Shock - evaluation and management  Sandrea HughsMichael Abe Schools 04/15/2017, 8:57 PM

## 2017-04-15 NOTE — Code Documentation (Signed)
Color change. Breathe sounds equal bilateral.

## 2017-04-15 NOTE — Code Documentation (Signed)
Central line being placed

## 2017-04-15 NOTE — ED Notes (Signed)
I stat lactic acid and I stat VBG results given to Dr. Jacqulyn BathLong by B. Bing PlumeHaynes, EMT

## 2017-04-15 NOTE — Code Documentation (Addendum)
Pulse check. PEA. CPR restarted  

## 2017-04-15 NOTE — Progress Notes (Signed)
Pt. K+ 2.8, HR is 160's, and pH 6.7 at 1500.  Reported to Dr. Sherene SiresWert.  K+ is being replaced. Vasopressin added to try and titrate Levo down, thinking Levo is cause for ST. Vent changed made for acidosis. Will continue to monitor closely.

## 2017-04-15 NOTE — ED Notes (Signed)
REport given 2H

## 2017-04-15 NOTE — ED Triage Notes (Signed)
Patient presents today from home with complaints of Alerted mental status since Midnight per EMS, Patient has HX of stroke. Per EMS patient baseline Normal walking and taking. Patient Patient does not responds to pain MD at bedside. Patient initial BP 60. Patient Sats 78 placed on NRB.

## 2017-04-15 NOTE — ED Notes (Signed)
Warm blanket placed.

## 2017-04-15 NOTE — Code Documentation (Signed)
Pulse back

## 2017-04-15 NOTE — Code Documentation (Signed)
Critical Care at bedside,  

## 2017-04-15 NOTE — Progress Notes (Signed)
PCCM INTERVAL PROGRESS NOTE   Called with ABG results  PH 6.78, CO2 35, HCO3 5, O2 246  Plan: increase rate on vent to 35, increase bicarb infusion to 14350ml/Hr, 2 amps Bicarb now.   Repeat ABG 2 hours.    Joneen RoachPaul Hoffman, AGACNP-BC [facility]/Critical [facility] Pager [phone] or [phone]  04/15/2017 3:53 PM

## 2017-04-15 NOTE — Progress Notes (Signed)
eLink Physician-Brief Progress Note Patient Name: Abigail RutherfordDeborah A Zamora DOB: [birth year] MRN: [id number]   Date of Service  04/15/2017  HPI/Events of Note  K+ 2.8  On naco3 and insulin drips  Lab Results  Component Value Date   CREATININE 1.40 (H) 04/15/2017   CREATININE 1.50 (H) 04/15/2017   CREATININE 2.34 (H) 04/15/2017   CREATININE 1.02 05/22/2014     Intake/Output Summary (Last 24 hours) at 04/15/17 1659 Last data filed at 04/15/17 1600  Gross per 24 hour  Intake          3654.66 ml  Output               50 ml  Net          3604.66 ml     eICU Interventions  Kcl x 6 runs      Intervention Category Major Interventions: Electrolyte abnormality - evaluation and management  Sandrea HughsMichael Logan Vegh 04/15/2017, 4:58 PM

## 2017-04-15 NOTE — Progress Notes (Signed)
eLink Physician-Brief Progress Note Patient Name: Narda RutherfordDeborah A Sivertsen DOB: 17-Mar-1951 MRN: [id number]   Date of Service  04/15/2017  HPI/Events of Note  Worse tachycardia / hypotension with cvp 13  eICU Interventions  NS 1 liter Add vasopressin Adjust vent to prevent air trapping       Intervention Category Major Interventions: Shock - evaluation and management  Sandrea HughsMichael Chisum Habenicht 04/15/2017, 4:42 PM

## 2017-04-15 NOTE — Progress Notes (Signed)
Met w/ family (husband) outside [facility].  Introduced chaplaincy services.  Will follow, as needed. 

## 2017-04-15 NOTE — ED Notes (Signed)
Levo increased 25mcg.

## 2017-04-15 NOTE — Code Documentation (Signed)
Levo started at 20mcg

## 2017-04-15 NOTE — Progress Notes (Signed)
CRITICAL VALUE ALERT  Critical Value:  Lactic 2.7  Date & Time Notied:  04/15/2017  2045  Provider Notified: Sherene SiresWert MD  Orders Received/Actions taken: Notified MD of current vital signs and other issues such as ventilator dyssynchrony, low urine output, and ongoing tachycardia. Orders received. Will continue to monitor closely. Modena JanskyKevin Snyder Colavito RN 2 heart

## 2017-04-15 NOTE — Progress Notes (Signed)
eLink Physician-Brief Progress Note Patient Name: Abigail RutherfordDeborah A Zamora DOB: 12-21-1950 MRN: [id number]   Date of Service  04/15/2017  HPI/Events of Note  Sinus tach and soft bp on levophed drip No intake or output data in the 24 hours ending 04/15/17 1529    eICU Interventions  Ns bolus/ check cvp      Intervention Category Major Interventions: Shock - evaluation and management  Sandrea HughsMichael Wert 04/15/2017, 3:28 PM

## 2017-04-15 NOTE — Progress Notes (Addendum)
Pharmacy Antibiotic Note  Narda RutherfordDeborah A Dory is a 66 y.o. female admitted on 04/15/2017 with sepsis.  Pharmacy has been consulted for vancomycin, aztreonam, levofloxacin dosing.  One time doses ordered in the ED. Weight was 63 kg a few weeks ago at office visit. LA 2.23. SCr 1.5, CrCl ~1435ml/min  Plan: Continue Levaquin 750mg  IV Q48h Continue aztreonam 1g IV Q8h Continue vancomycin 1g IV Q24h Monitor clinical picture, renal function, VT prn F/U C&S, abx deescalation / LOT    Temp (24hrs), Avg:96 F (35.6 C), Min:96 F (35.6 C), Max:96 F (35.6 C)   Recent Labs Lab 04/15/17 1147  LATICACIDVEN 2.23*    CrCl cannot be calculated (Patient's most recent lab result is older than the maximum 21 days allowed.).    Allergies  Allergen Reactions  . Penicillins     Rash     Antimicrobials this admission: Levaquin 5/28 >>  Vancomycin 5/28 >>  Aztreonam 5/28 >>  Dose adjustments this admission: n/a  Microbiology results: 5/28 BCx: sent  Thank you for allowing pharmacy to be a part of this patient's care.  Enzo BiNathan Celia Gibbons, PharmD, BCPS Clinical Pharmacist Pager [phone] 04/15/2017 11:55 AM   ADDENDUM:  CCM stopping aztreonam and Levaquin and switching to cefepime  Plan: Start cefepime 2g IV Q24h Continue vancomycin 1g IV Q24h Monitor clinical picture, renal function, VT prn F/U C&S, abx deescalation / LOT  Enzo BiNathan Adrien Shankar, PharmD, BCPS Clinical Pharmacist Pager [phone] 04/15/2017 1:38 PM

## 2017-04-15 NOTE — ED Notes (Signed)
Intabation in process.

## 2017-04-15 NOTE — Progress Notes (Signed)
ABG isn't crossing over into pt.'s chart. Results are as follows; pH: 6.71, PCO2:40.6, PO2: 277, HCO3: 5.3. Joneen RoachPaul Hoffman & RN were made aware of ABG results & new orders were given to increase RR to 35 as well as to increase the Bicarb drip.

## 2017-04-15 NOTE — ED Provider Notes (Signed)
[facility] DEPT Provider Note   CSN: [id number] Arrival date & time: 04/15/17  1120     History   Chief Complaint No chief complaint on file.   HPI [name] is a 66 y.o. female.  The history is provided by the EMS personnel and a relative. The history is limited by the condition of the patient.  Altered Mental Status   This is a new problem. Episode onset: started yesterday. The problem has been rapidly worsening. Associated symptoms include confusion, somnolence and unresponsiveness. Risk factors: DM. Her past medical history is significant for CVA.    Past Medical History:  Diagnosis Date  . Allergy   . Diabetes mellitus without complication ([place])   . Mixed stress and urge urinary incontinence   . Schizophrenia [facility])     Patient Active Problem List   Diagnosis Date Noted  . Thrombocytopenia ([place]) 09/26/2016  . Pressure injury of skin 09/25/2016  . Hyperkalemia 09/24/2016  . Hyponatremia 09/24/2016  . Metabolic acidosis 40/98/1191  . DKA, type 2 ([place]) 09/24/2016  . Pneumonia due to Staphylococcus ([place]) 11/08/2014  . Superior vena caval thrombosis ([place]) 11/08/2014  . CVA (cerebral infarction) 11/08/2014  . Chronic systolic congestive heart failure ([place]) 11/08/2014  . Anemia of chronic disease 11/08/2014  . Dysphagia 11/08/2014  . Schizophrenia ([place]) 11/08/2014  . Bacteremia due to Staphylococcus aureus   . Acute respiratory failure ([place])   . Dehydration   . Community acquired pneumonia   . Anoxic brain injury ([place])   . Acute respiratory failure with hypoxia ([place])   . AKI (acute kidney injury) ([place])   . Septic shock ([place]) 10/02/2014  . Encephalopathy acute 10/02/2014  . Altered mental status 10/01/2014  . Foreign body alimentary tract 10/01/2014  . Shock ([place])   . Cardiac arrest ([place])   . Foreign body ingestion   . Diabetes ([place]) 05/22/2014  . Diabetic ketoacidosis associated with type 2 diabetes mellitus ([place]) 05/22/2014  . Constipation  05/22/2014  . Mood disorder ([place]) 05/22/2014    No past surgical history on file.  OB History    Gravida Para Term Preterm AB Living   0             SAB TAB Ectopic Multiple Live Births                   Home Medications    Prior to Admission medications   Medication Sig Start Date End Date Taking? Authorizing Provider  ARIPiprazole (ABILIFY) 15 MG tablet Take 15 mg by mouth daily.    [provider]  atorvastatin (LIPITOR) 10 MG tablet Take 1 tablet (10 mg total) by mouth daily. 10/13/14   Donita Brooks, NP  benztropine (COGENTIN) 1 MG tablet Take 1 mg by mouth at bedtime.     [provider]  buPROPion (WELLBUTRIN) 75 MG tablet Take 75 mg by mouth 2 (two) times daily.    [provider]  clotrimazole (LOTRIMIN) 1 % cream Apply 1 application topically 2 (two) times daily.    [provider]  ferrous sulfate 325 (65 FE) MG tablet Take 325 mg by mouth daily with breakfast.    [provider]  insulin detemir (LEVEMIR) 100 UNIT/ML injection Inject 0.2 mLs (20 Units total) into the skin at bedtime. 09/27/16   Velvet Bathe, MD  mirabegron ER (MYRBETRIQ) 50 MG TB24 tablet Take 50 mg by mouth daily.    [provider]  mirtazapine (REMERON) 15 MG tablet Take 15  mg by mouth at bedtime.    [provider]    Family History Family History  Problem Relation Age of Onset  . Heart disease Mother   . Hypertension Father   . Stroke Father   . Heart disease Father   . Diabetes Father   . Emphysema Paternal Grandfather     Social History Social History  Substance Use Topics  . Smoking status: Former Smoker    Packs/day: 3.00    Types: Cigarettes  . Smokeless tobacco: Never Used  . Alcohol use 3.6 oz/week    6 Cans of beer per week     Allergies   Penicillins   Review of Systems Review of Systems  Unable to perform ROS: Patient unresponsive  Psychiatric/Behavioral: Positive for confusion.     Physical  Exam Updated Vital Signs There were no vitals taken for this visit. ED Triage Vitals  Enc Vitals Group     BP 04/15/17 1130 (!) 71/37     Pulse Rate 04/15/17 1134 92     Resp 04/15/17 1130 18     Temp 04/15/17 1145 (!) 96 F (35.6 C)     Temp Source 04/15/17 1145 Axillary     SpO2 04/15/17 1134 100 %     Weight --      Height --      Head Circumference --      Peak Flow --      Pain Score --      Pain Loc --      Pain Edu? --      Excl. in [place]? --     Physical Exam  Constitutional: She appears lethargic. She appears toxic. Face mask in place.  HENT:  Head: Normocephalic and atraumatic.  Dry mucous membranes  Eyes: Pupils are equal, round, and reactive to light.  No corneal reflexes. Will not follow with gaze.   Cardiovascular: Regular rhythm, normal heart sounds and intact distal pulses.   tachy  Pulmonary/Chest:  Patient with deep respirations. Rhonchi throughout  Abdominal: Soft. She exhibits no distension.  Musculoskeletal: She exhibits no edema or deformity.  Neurological: She appears lethargic. GCS eye subscore is 4. GCS verbal subscore is 2. GCS motor subscore is 1.  Skin: No erythema.  Mottling with prolonged cap refill  Nursing note and vitals reviewed.    ED Treatments / Results  Labs (all labs ordered are listed, but only abnormal results are displayed) Labs Reviewed - No data to display  EKG  EKG Interpretation None       Radiology No results found.  Procedures Procedure Name: Intubation Date/Time: 04/15/2017 1:21 PM Performed by: Heriberto Antigua Oxygen Delivery Method: Ambu bag Intubation Type: Rapid sequence Ventilation: Mask ventilation without difficulty Laryngoscope Size: Mac and 4 (video) Grade View: Grade II Tube type: Subglottic suction tube Tube size: 7.5 mm Number of attempts: 1 Airway Equipment and Method: Video-laryngoscopy Placement Confirmation: ETT inserted through vocal cords under direct vision,  Positive ETCO2 and Breath  sounds checked- equal and bilateral Secured at: 25 cm Tube secured with: ETT holder Difficulty Due To: Difficulty was anticipated Future Recommendations: Recommend- induction with short-acting agent, and alternative techniques readily available Comments: Intubated immediately post CPR with Roc only as pt gcs 4 at the time.     Linus Salmons Line Date/Time: 04/15/2017 1:24 PM Performed by: Heriberto Antigua Authorized by: Margette Fast   Consent:    Consent obtained:  Emergent situation   Consent given by:  Spouse Pre-procedure details:  Hand hygiene: Hand hygiene performed prior to insertion     Skin preparation:  ChloraPrep   Skin preparation agent: Skin preparation agent completely dried prior to procedure   Sedation:    Sedation type: pt intubated post CPR. Fentanyl will be planned for use. Procedure details:    Location:  R internal jugular   Site selection rationale:  Ease of access   Patient position:  Flat   Procedural supplies:  Triple lumen   Ultrasound guidance: yes     Sterile ultrasound techniques: Sterile gel and sterile probe covers were used     Number of attempts:  1   Successful placement: yes   Post-procedure details:    Post-procedure:  Dressing applied and line sutured   Assessment:  Blood return through all ports, free fluid flow, no pneumothorax on x-ray and placement verified by x-ray   Patient tolerance of procedure:  Tolerated well, no immediate complications   (including critical care time)  EMERGENCY DEPARTMENT  US GUIDANCE EXAM Emergency Ultrasound:  US Guidance for Needle Guidance  INDICATIONS: Difficult vascular access Linear probe used in real-time to visualize location of needle entry through skin.   PERFORMED BY: Myself IMAGES ARCHIVED?: No LIMITATIONS: None VIEWS USED: Transverse INTERPRETATION: Left arm and Needle gauge 18   Medications Ordered in ED Medications - No data to display   Initial Impression / Assessment and Plan / ED  Course  I have reviewed the triage vital signs and the nursing notes.  Pertinent labs & imaging results that were available during my care of the patient were reviewed by me and considered in my medical decision making (see chart for details).     Patient is a 66 year old female with a complex past medical history presents with EMS for concern of worsening altered mental status. Per husband she started becoming altered yesterday reminding him of her DKA in the past. They did not obtain medical care at that time but he states that she took "one of her shots". He then went outside to sleep overnight and did not check on her toe this morning where he found her unresponsive. Upon arrival patient is unresponsive with a GCS of 6-8 as well as hypotensive and tachycardic. At this time concern about severe DKA as well as possible septic shock. Patient given IV fluids, blood cultures and urine cultures sent, broad-spectrum antibiotics started. Labs reveal severe metabolic acidosis as well as signs of dka. Insulin drip and aggressive IVF resuc given.    During patient's days she suddenly became bradycardic and pulseless, thus CPR and ACLS was initiated. Please refer to code sheet for exact details. She was given 2 rounds of epinephrine and did achieve return of spontaneous circulation. Rhythm did appear to be PEA arrest. She was subsequently intubated and had a central line placed as above. She was started on a levophed drip after blood pressures remained hypotensive.   Patient will be admitted to the ICU for further management and evaluation.  Final Clinical Impressions(s) / ED Diagnoses   Final diagnoses:  None    New Prescriptions New Prescriptions   No medications on file     Heriberto Antigua, MD 04/16/17 1455    Margette Fast, MD 04/16/17 [phone]

## 2017-04-15 NOTE — Code Documentation (Signed)
X ray in room.

## 2017-04-15 NOTE — H&P (Signed)
PCCM History and Physical Note  Admission date: 04/15/2017 Referring provider: Dr. Armond Hang, ER  CC: Altered mental status  HPI: Hx from family, ER staff, and medical record.  Pt hasn't been feeling well for past several weeks.  Family reports her blood sugar normal runs 300 to 500.  She was told she had a urine infection and an infection throughout her body.  Family isn't sure if she picked up antibiotic or whether she has been compliant with medications.  She is followed by Dr. Shawnee Knapp with endocrinology.  She was changed from levemir to tresiba on 03/26/17.  She has been using the bathroom more frequently.  She was noted to be more confused and breathing heavy night prior to admission.  She took extra insulin and went to sleep.  Family couldn't wake her up and brought her to the ER.  She developed PEA arrest with ROSC after about 5 minutes.  She was acidotic, hyperglycemia, hypotension.    She  has a past medical history of Allergy; Diabetes mellitus without complication (HCC); Mixed stress and urge urinary incontinence; and Schizophrenia (HCC).  She  has no past surgical history on file.  She family history includes Diabetes in her father; Emphysema in her paternal grandfather; Heart disease in her father and mother; Hypertension in her father; Stroke in her father.  She  reports that she has quit smoking. Her smoking use included Cigarettes. She smoked 3.00 packs per day. She has never used smokeless tobacco. She reports that she drinks about 3.6 oz of alcohol per week .  Allergies  Allergen Reactions  . Penicillins     Rash     No current facility-administered medications on file prior to encounter.    Current Outpatient Prescriptions on File Prior to Encounter  Medication Sig  . ARIPiprazole (ABILIFY) 15 MG tablet Take 15 mg by mouth daily.  Marland Kitchen atorvastatin (LIPITOR) 10 MG tablet Take 1 tablet (10 mg total) by mouth daily.  . benztropine (COGENTIN) 1 MG tablet Take 1 mg by mouth at  bedtime.   Marland Kitchen buPROPion (WELLBUTRIN) 75 MG tablet Take 75 mg by mouth 2 (two) times daily.  . clotrimazole (LOTRIMIN) 1 % cream Apply 1 application topically 2 (two) times daily.  . ferrous sulfate 325 (65 FE) MG tablet Take 325 mg by mouth daily with breakfast.  . insulin detemir (LEVEMIR) 100 UNIT/ML injection Inject 0.2 mLs (20 Units total) into the skin at bedtime.  . mirabegron ER (MYRBETRIQ) 50 MG TB24 tablet Take 50 mg by mouth daily.  . mirtazapine (REMERON) 15 MG tablet Take 15 mg by mouth at bedtime.   ROS: Unable to obtain  Vital signs: BP 91/64   Pulse (!) 146   Temp (!) 96 F (35.6 C) (Axillary)   Resp (!) 24   Ht 5\' 7"  (1.702 m)   SpO2 100%   Intake/output: No intake/output data recorded.  General: ill appearing Neuro: chemically paralyzed, sedated HEENT: ETT in place Cardiac: HR regular, tachycardic Chest: no wheeze Abd: soft, non tender, decreased BS Ext: no edema  Skin: no rashes   CMP Latest Ref Rng & Units 04/15/2017 04/15/2017 09/27/2016  Glucose 65 - 99 mg/dL 782(NF) 621(HY) 865(H)  BUN 6 - 20 mg/dL 84(O) 96(E) 95(M)  Creatinine 0.44 - 1.00 mg/dL 8.41(L) 2.44(W) 1.02(V)  Sodium 135 - 145 mmol/L 136 135 142  Potassium 3.5 - 5.1 mmol/L 5.5(H) 5.5(H) 4.6  Chloride 101 - 111 mmol/L 114(H) 107 118(H)  CO2 22 - 32 mmol/L - <  7(L) 19(L)  Calcium 8.9 - 10.3 mg/dL - 9.1 1.6(X)  Total Protein 6.5 - 8.1 g/dL - 6.9 -  Total Bilirubin 0.3 - 1.2 mg/dL - 1.6(H) -  Alkaline Phos 38 - 126 U/L - 141(H) -  AST 15 - 41 U/L - 16 -  ALT 14 - 54 U/L - 15 -     CBC Latest Ref Rng & Units 04/15/2017 04/15/2017 09/27/2016  WBC 4.0 - 10.5 K/uL - 14.4(H) 5.8  Hemoglobin 12.0 - 15.0 g/dL 09.6 04.5 40.9  Hematocrit 36.0 - 46.0 % 44.0 44.7 37.1  Platelets 150 - 400 K/uL - 323 75(L)     ABG    Component Value Date/Time   PHART 7.307 (L) 09/25/2016 0130   PCO2ART 30.0 (L) 09/25/2016 0130   PO2ART 99.6 09/25/2016 0130   HCO3 16.5 (L) 09/25/2016 0130   TCO2 6 04/15/2017  1156   ACIDBASEDEF 10.5 (H) 09/25/2016 0130   O2SAT 97.5 09/25/2016 0130     CBG (last 3)   Recent Labs  04/15/17 1136  GLUCAP 525*     Imaging: Dg Chest Portable 1 View  Result Date: 04/15/2017 CLINICAL DATA:  Check endotracheal tube placement EXAM: PORTABLE CHEST 1 VIEW COMPARISON:  04/15/2017 FINDINGS: Cardiac shadow is stable. Endotracheal tube is now seen 2 cm above the carina. Right jugular central line is noted in the mid superior vena cava. No pneumothorax is seen. Stable vascular congestion is noted. No focal infiltrate is seen. IMPRESSION: Mild vascular congestion is again seen and stable. Tubes and lines as described. Electronically Signed   By: Alcide Clever M.D.   On: 04/15/2017 13:20   Dg Chest Port 1 View  Result Date: 04/15/2017 CLINICAL DATA:  Altered mental status. History of stroke. Hypotension and hypoxia. EXAM: PORTABLE CHEST 1 VIEW COMPARISON:  09/24/2016 and 11/01/2014. FINDINGS: 1142 hours. The heart size and mediastinal contours are stable. There is mild aortic atherosclerosis and vascular congestion. No focal airspace disease, pneumothorax or significant pleural effusion identified. The bones appear intact. Telemetry leads overlie the chest. IMPRESSION: Vascular congestion without overt edema or focal airspace disease. Electronically Signed   By: Carey Bullocks M.D.   On: 04/15/2017 12:05     Studies:  Antibiotics: Vancomycin 5/28 >> Cefepime 5/28 >>  Cultures: Blood 5/28 >> Urine 5/28 >> Sputum 5/28 >>  Lines/tubes: ETT 5/28 >> Rt IJ CVL 5/28 >>  Events: 5/28 Admit, DKA, cardiac arrest, VDRF  Summary: 66 yo female with DKA, PEA cardiac arrest, acute renal failure, sepsis, VDRF.    Assessment/plan:  Acute respiratory failure with hypoxia in setting of DKA, cardiac arrest, and possible aspiration. - full vent support - f/u CXR, ABG  DKA. - continue IV fluid - insulin gtt until anion gap closed - transition to D5 1/2 NS when CBG <  250  PEA cardiac arrest. Shock - hypovolemic, septic. - continue IV fluids - pressors to keep MAP > 65 - Echo  Septic shock. - recently found to have UTI - day 1 vancomycin, cefepime  Metabolic acidosis. Lactic acidosis. Acute renal failure. Hyperkalemia. - continue HCO3 in IV fluid for now - f/u BMET, ABG - f/u electrolytes  Acute metabolic encephalopathy. Hx of cognitive impairment, bipolar, CVA 2015. - RASS goal 0 to -1 - hold outpt abilify, cogentin, wellbutrin, remeron  DVT prophylaxis - SQ heparin SUP - protonix Nutrition - NPO Goals of care - full code  Updated family at bedside  CC time 42 minutes  Coralyn Helling, MD [facility]  Pulmonary/Critical Care 04/15/2017, 1:44 PM Pager:  [phone] After 3pm call: [phone]

## 2017-04-16 ENCOUNTER — Inpatient Hospital Stay (HOSPITAL_COMMUNITY): Payer: Medicare Other

## 2017-04-16 DIAGNOSIS — I9589 Other hypotension: Secondary | ICD-10-CM

## 2017-04-16 DIAGNOSIS — I959 Hypotension, unspecified: Secondary | ICD-10-CM | POA: Diagnosis present

## 2017-04-16 DIAGNOSIS — J9601 Acute respiratory failure with hypoxia: Secondary | ICD-10-CM

## 2017-04-16 DIAGNOSIS — E1111 Type 2 diabetes mellitus with ketoacidosis with coma: Secondary | ICD-10-CM

## 2017-04-16 DIAGNOSIS — J9600 Acute respiratory failure, unspecified whether with hypoxia or hypercapnia: Secondary | ICD-10-CM

## 2017-04-16 DIAGNOSIS — I350 Nonrheumatic aortic (valve) stenosis: Secondary | ICD-10-CM

## 2017-04-16 LAB — BASIC METABOLIC PANEL
ANION GAP: 12 (ref 5–15)
ANION GAP: 12 (ref 5–15)
ANION GAP: 9 (ref 5–15)
BUN: 32 mg/dL — ABNORMAL HIGH (ref 6–20)
BUN: 33 mg/dL — AB (ref 6–20)
BUN: 39 mg/dL — ABNORMAL HIGH (ref 6–20)
CALCIUM: 6.8 mg/dL — AB (ref 8.9–10.3)
CHLORIDE: 111 mmol/L (ref 101–111)
CO2: 14 mmol/L — AB (ref 22–32)
CO2: 18 mmol/L — ABNORMAL LOW (ref 22–32)
CO2: 18 mmol/L — AB (ref 22–32)
CREATININE: 2.11 mg/dL — AB (ref 0.44–1.00)
Calcium: 7 mg/dL — ABNORMAL LOW (ref 8.9–10.3)
Calcium: 7.1 mg/dL — ABNORMAL LOW (ref 8.9–10.3)
Chloride: 108 mmol/L (ref 101–111)
Chloride: 108 mmol/L (ref 101–111)
Creatinine, Ser: 2.29 mg/dL — ABNORMAL HIGH (ref 0.44–1.00)
Creatinine, Ser: 2.55 mg/dL — ABNORMAL HIGH (ref 0.44–1.00)
GFR calc non Af Amer: 24 mL/min — ABNORMAL LOW (ref >60)
GFR calc non Af Amer: 19 mL/min — ABNORMAL LOW (ref >60)
GFR, EST AFRICAN AMERICAN: 27 mL/min — AB (ref >60)
GFR, EST AFRICAN AMERICAN: 25 mL/min — AB (ref >60)
GFR, EST AFRICAN AMERICAN: 22 mL/min — AB (ref >60)
GFR, EST NON AFRICAN AMERICAN: 21 mL/min — AB (ref >60)
Glucose, Bld: 364 mg/dL — ABNORMAL HIGH (ref 65–99)
Glucose, Bld: 215 mg/dL — ABNORMAL HIGH (ref 65–99)
Glucose, Bld: 276 mg/dL — ABNORMAL HIGH (ref 65–99)
Potassium: 3.2 mmol/L — ABNORMAL LOW (ref 3.5–5.1)
Potassium: 2.7 mmol/L — CL (ref 3.5–5.1)
Potassium: 3.6 mmol/L (ref 3.5–5.1)
SODIUM: 137 mmol/L (ref 135–145)
SODIUM: 138 mmol/L (ref 135–145)
SODIUM: 135 mmol/L (ref 135–145)

## 2017-04-16 LAB — GLUCOSE, CAPILLARY
GLUCOSE-CAPILLARY: 129 mg/dL — AB (ref 65–99)
GLUCOSE-CAPILLARY: 214 mg/dL — AB (ref 65–99)
GLUCOSE-CAPILLARY: 218 mg/dL — AB (ref 65–99)
GLUCOSE-CAPILLARY: 226 mg/dL — AB (ref 65–99)
GLUCOSE-CAPILLARY: 244 mg/dL — AB (ref 65–99)
GLUCOSE-CAPILLARY: 249 mg/dL — AB (ref 65–99)
GLUCOSE-CAPILLARY: 268 mg/dL — AB (ref 65–99)
GLUCOSE-CAPILLARY: 366 mg/dL — AB (ref 65–99)
GLUCOSE-CAPILLARY: 415 mg/dL — AB (ref 65–99)
GLUCOSE-CAPILLARY: 438 mg/dL — AB (ref 65–99)
Glucose-Capillary: 160 mg/dL — ABNORMAL HIGH (ref 65–99)
Glucose-Capillary: 163 mg/dL — ABNORMAL HIGH (ref 65–99)
Glucose-Capillary: 186 mg/dL — ABNORMAL HIGH (ref 65–99)
Glucose-Capillary: 192 mg/dL — ABNORMAL HIGH (ref 65–99)
Glucose-Capillary: 199 mg/dL — ABNORMAL HIGH (ref 65–99)
Glucose-Capillary: 228 mg/dL — ABNORMAL HIGH (ref 65–99)
Glucose-Capillary: 243 mg/dL — ABNORMAL HIGH (ref 65–99)
Glucose-Capillary: 258 mg/dL — ABNORMAL HIGH (ref 65–99)
Glucose-Capillary: 263 mg/dL — ABNORMAL HIGH (ref 65–99)
Glucose-Capillary: 268 mg/dL — ABNORMAL HIGH (ref 65–99)
Glucose-Capillary: 307 mg/dL — ABNORMAL HIGH (ref 65–99)
Glucose-Capillary: 311 mg/dL — ABNORMAL HIGH (ref 65–99)
Glucose-Capillary: 362 mg/dL — ABNORMAL HIGH (ref 65–99)
Glucose-Capillary: 365 mg/dL — ABNORMAL HIGH (ref 65–99)
Glucose-Capillary: 90 mg/dL (ref 65–99)

## 2017-04-16 LAB — CBC WITH DIFFERENTIAL/PLATELET
Basophils Absolute: 0 10*3/uL (ref 0.0–0.1)
Basophils Relative: 0 %
EOS PCT: 0 %
Eosinophils Absolute: 0 10*3/uL (ref 0.0–0.7)
HEMATOCRIT: 35 % — AB (ref 36.0–46.0)
Hemoglobin: 11.7 g/dL — ABNORMAL LOW (ref 12.0–15.0)
Lymphocytes Relative: 5 %
Lymphs Abs: 0.3 10*3/uL — ABNORMAL LOW (ref 0.7–4.0)
MCH: 26.5 pg (ref 26.0–34.0)
MCHC: 33.4 g/dL (ref 30.0–36.0)
MCV: 79.2 fL (ref 78.0–100.0)
MONO ABS: 1 10*3/uL (ref 0.1–1.0)
Monocytes Relative: 15 %
NEUTROS ABS: 5.5 10*3/uL (ref 1.7–7.7)
Neutrophils Relative %: 80 %
PLATELETS: 153 10*3/uL (ref 150–400)
RBC: 4.42 MIL/uL (ref 3.87–5.11)
RDW: 17.3 % — AB (ref 11.5–15.5)
WBC Morphology: INCREASED
WBC: 6.8 10*3/uL (ref 4.0–10.5)

## 2017-04-16 LAB — POCT I-STAT 3, ART BLOOD GAS (G3+)
ACID-BASE DEFICIT: 17 mmol/L — AB (ref 0.0–2.0)
ACID-BASE DEFICIT: 9 mmol/L — AB (ref 0.0–2.0)
BICARBONATE: 16.3 mmol/L — AB (ref 20.0–28.0)
Bicarbonate: 10 mmol/L — ABNORMAL LOW (ref 20.0–28.0)
O2 SAT: 94 %
O2 Saturation: 81 %
PCO2 ART: 31 mmHg — AB (ref 32.0–48.0)
PH ART: 7.175 — AB (ref 7.350–7.450)
PH ART: 7.327 — AB (ref 7.350–7.450)
PO2 ART: 55 mmHg — AB (ref 83.0–108.0)
Patient temperature: 98
Patient temperature: 97.5
TCO2: 11 mmol/L (ref 0–100)
TCO2: 17 mmol/L (ref 0–100)
pCO2 arterial: 27.1 mmHg — ABNORMAL LOW (ref 32.0–48.0)
pO2, Arterial: 71 mmHg — ABNORMAL LOW (ref 83.0–108.0)

## 2017-04-16 LAB — ECHOCARDIOGRAM COMPLETE
Height: 67 in
Weight: 2105.83 oz

## 2017-04-16 LAB — TROPONIN I: Troponin I: 0.18 ng/mL (ref <0.03)

## 2017-04-16 LAB — BLOOD GAS, ARTERIAL
Acid-base deficit: 11 mmol/L — ABNORMAL HIGH (ref 0.0–2.0)
BICARBONATE: 13.6 mmol/L — AB (ref 20.0–28.0)
Drawn by: 36277
FIO2: 50
LHR: 22 {breaths}/min
O2 Saturation: 94.6 %
PEEP: 8 cmH2O
PO2 ART: 64 mmHg — AB (ref 83.0–108.0)
Patient temperature: 98.6
VT: 650 mL
pCO2 arterial: 25.7 mmHg — ABNORMAL LOW (ref 32.0–48.0)
pH, Arterial: 7.344 — ABNORMAL LOW (ref 7.350–7.450)

## 2017-04-16 LAB — LACTIC ACID, PLASMA: LACTIC ACID, VENOUS: 1.8 mmol/L (ref 0.5–1.9)

## 2017-04-16 LAB — URINE CULTURE: CULTURE: NO GROWTH

## 2017-04-16 LAB — CG4 I-STAT (LACTIC ACID): Lactic Acid, Venous: 1.55 mmol/L (ref 0.5–1.9)

## 2017-04-16 LAB — MAGNESIUM
MAGNESIUM: 2.6 mg/dL — AB (ref 1.7–2.4)
Magnesium: 1.3 mg/dL — ABNORMAL LOW (ref 1.7–2.4)

## 2017-04-16 LAB — PROCALCITONIN: PROCALCITONIN: 29.82 ng/mL

## 2017-04-16 LAB — PHOSPHORUS

## 2017-04-16 MED ORDER — DEXTROSE 5 % IV SOLN
1.0000 g | INTRAVENOUS | Status: DC
  Administered 2017-04-16 – 2017-04-17 (×2): 1 g via INTRAVENOUS
  Filled 2017-04-16 (×2): qty 1

## 2017-04-16 MED ORDER — VITAL AF 1.2 CAL PO LIQD
1000.0000 mL | ORAL | Status: DC
  Administered 2017-04-16: 1000 mL

## 2017-04-16 MED ORDER — SODIUM CHLORIDE 0.9 % IV BOLUS (SEPSIS)
1000.0000 mL | Freq: Once | INTRAVENOUS | Status: AC
  Administered 2017-04-16: 1000 mL via INTRAVENOUS

## 2017-04-16 MED ORDER — POTASSIUM CHLORIDE 10 MEQ/100ML IV SOLN
10.0000 meq | INTRAVENOUS | Status: AC
  Administered 2017-04-16 (×4): 10 meq via INTRAVENOUS
  Filled 2017-04-16 (×4): qty 100

## 2017-04-16 MED ORDER — MAGNESIUM SULFATE 2 GM/50ML IV SOLN
2.0000 g | Freq: Once | INTRAVENOUS | Status: AC
  Administered 2017-04-16: 2 g via INTRAVENOUS
  Filled 2017-04-16: qty 50

## 2017-04-16 MED ORDER — VITAL HIGH PROTEIN PO LIQD: 1000.0000 mL | ORAL | Status: DC

## 2017-04-16 MED ORDER — VANCOMYCIN HCL 500 MG IV SOLR
500.0000 mg | INTRAVENOUS | Status: DC
  Administered 2017-04-16: 500 mg via INTRAVENOUS
  Filled 2017-04-16 (×3): qty 500

## 2017-04-16 MED ORDER — POTASSIUM PHOSPHATES 15 MMOLE/5ML IV SOLN
30.0000 mmol | Freq: Once | INTRAVENOUS | Status: AC
  Administered 2017-04-16: 30 mmol via INTRAVENOUS
  Filled 2017-04-16: qty 10

## 2017-04-16 MED ORDER — POTASSIUM CHLORIDE 20 MEQ/15ML (10%) PO SOLN
40.0000 meq | Freq: Once | ORAL | Status: AC
  Administered 2017-04-16: 40 meq via ORAL
  Filled 2017-04-16: qty 30

## 2017-04-16 MED ORDER — PRO-STAT SUGAR FREE PO LIQD
30.0000 mL | Freq: Two times a day (BID) | ORAL | Status: DC
  Administered 2017-04-16: 30 mL
  Filled 2017-04-16: qty 30

## 2017-04-16 MED ORDER — POTASSIUM CHLORIDE 10 MEQ/50ML IV SOLN
10.0000 meq | INTRAVENOUS | Status: AC
  Administered 2017-04-16 – 2017-04-17 (×4): 10 meq via INTRAVENOUS
  Filled 2017-04-16 (×4): qty 50

## 2017-04-16 MED ORDER — PHENYLEPHRINE HCL 10 MG/ML IJ SOLN
0.0000 ug/min | INTRAMUSCULAR | Status: DC
  Administered 2017-04-16: 5 ug/min via INTRAVENOUS
  Filled 2017-04-16: qty 1

## 2017-04-16 MED ORDER — ADULT MULTIVITAMIN LIQUID CH
15.0000 mL | Freq: Every day | ORAL | Status: DC
  Administered 2017-04-16 – 2017-04-25 (×8): 15 mL
  Filled 2017-04-16 (×9): qty 15

## 2017-04-16 MED ORDER — MAGNESIUM SULFATE 4 GM/100ML IV SOLN
4.0000 g | Freq: Once | INTRAVENOUS | Status: AC
  Administered 2017-04-16: 4 g via INTRAVENOUS
  Filled 2017-04-16: qty 100

## 2017-04-16 MED ORDER — SODIUM CHLORIDE 0.9 % IV SOLN
INTRAVENOUS | Status: DC
  Administered 2017-04-17: 06:00:00 via INTRAVENOUS
  Filled 2017-04-16 (×2): qty 1

## 2017-04-16 MED ORDER — SODIUM CHLORIDE 0.9 % IV SOLN
0.0000 ug/min | INTRAVENOUS | Status: DC
  Administered 2017-04-16: 190 ug/min via INTRAVENOUS
  Administered 2017-04-17: 40 ug/min via INTRAVENOUS
  Administered 2017-04-17: 200 ug/min via INTRAVENOUS
  Administered 2017-04-18: 20 ug/min via INTRAVENOUS
  Filled 2017-04-16 (×3): qty 8

## 2017-04-16 MED ORDER — SODIUM CHLORIDE 0.9 % IV SOLN
0.0000 ug/min | INTRAVENOUS | Status: DC
  Administered 2017-04-16: 250 ug/min via INTRAVENOUS
  Administered 2017-04-16: 100 ug/min via INTRAVENOUS
  Administered 2017-04-16 (×2): 200 ug/min via INTRAVENOUS
  Filled 2017-04-16 (×5): qty 4

## 2017-04-16 MED FILL — Medication: Qty: 1 | Status: AC

## 2017-04-16 NOTE — Progress Notes (Signed)
PCCM History and Physical Note  Admission date: 04/15/2017 Referring provider: Dr. Armond Hang, ER  CC: Altered mental status  HPI: Hx from family, ER staff, and medical record.  Pt hasn't been feeling well for past several weeks.  Family reports her blood sugar normal runs 300 to 500.  She was told she had a urine infection and an infection throughout her body.  Family isn't sure if she picked up antibiotic or whether she has been compliant with medications.  She is followed by Dr. Shawnee Knapp with endocrinology.  She was changed from levemir to tresiba on 03/26/17.  She has been using the bathroom more frequently.  She was noted to be more confused and breathing heavy night prior to admission.  She took extra insulin and went to sleep.  Family couldn't wake her up and brought her to the ER.  She developed PEA arrest with ROSC after about 5 minutes.  She was acidotic, hyperglycemia, hypotension.    Subjective: Opens eyes and follows commands. On pressors.    Vital signs: BP (!) 75/63   Pulse (!) 130   Temp 98 F (36.7 C) (Oral)   Resp (!) 23   Ht 5\' 7"  (1.702 m)   Wt 131 lb 9.8 oz (59.7 kg)   SpO2 97%   BMI 20.61 kg/m   Intake/output: I/O last 3 completed shifts: In: 16756.8 [I.V.:8363.5; IV Piggyback:8393.3] Out: 350 [Urine:185; Emesis/NG output:165]  General: ill appearing Neuro: opens eyes and moves toes on command HEENT: ETT in place Cardiac: HR regular, tachycardic Chest: diffuse rhonchi on right side Abd: soft, non tender Ext: no edema  Skin: no rashes   CMP Latest Ref Rng & Units 04/16/2017 04/15/2017 04/15/2017  Glucose 65 - 99 mg/dL 161(W) 960(A) 540(JW)  BUN 6 - 20 mg/dL 11(B) 19 14(N)  Creatinine 0.44 - 1.00 mg/dL 8.29(F) 6.21(H) 0.86(V)  Sodium 135 - 145 mmol/L 137 143 136  Potassium 3.5 - 5.1 mmol/L 3.2(L) 2.8(L) 5.5(H)  Chloride 101 - 111 mmol/L 111 123(H) 114(H)  CO2 22 - 32 mmol/L 14(L) <7(L) -  Calcium 8.9 - 10.3 mg/dL 7.8(I) 5.3(LL) -  Total Protein 6.5 - 8.1  g/dL - - -  Total Bilirubin 0.3 - 1.2 mg/dL - - -  Alkaline Phos 38 - 126 U/L - - -  AST 15 - 41 U/L - - -  ALT 14 - 54 U/L - - -     CBC Latest Ref Rng & Units 04/16/2017 04/15/2017 04/15/2017  WBC 4.0 - 10.5 K/uL 6.8 - 14.4(H)  Hemoglobin 12.0 - 15.0 g/dL 11.7(L) 15.0 13.7  Hematocrit 36.0 - 46.0 % 35.0(L) 44.0 44.7  Platelets 150 - 400 K/uL 153 - 323     ABG    Component Value Date/Time   PHART 7.344 (L) 04/16/2017 0354   PCO2ART 25.7 (L) 04/16/2017 0354   PO2ART 64.0 (L) 04/16/2017 0354   HCO3 13.6 (L) 04/16/2017 0354   TCO2 11 04/16/2017 0113   ACIDBASEDEF 11.0 (H) 04/16/2017 0354   O2SAT 94.6 04/16/2017 0354     CBG (last 3)   Recent Labs  04/16/17 0416 04/16/17 0513 04/16/17 0617  GLUCAP 307* 268* 249*     Imaging: Dg Chest Port 1 View  Result Date: 04/16/2017 CLINICAL DATA:  Intubation.  Respiratory failure . EXAM: PORTABLE CHEST 1 VIEW COMPARISON:  04/15/2017. FINDINGS: Endotracheal tube, NG tube, right IJ line stable position. Heart size stable. Increased interstitial markings suggesting CHF. Pneumonitis cannot be excluded. Low lung volumes basilar atelectasis. IMPRESSION:  1.  Lines and tubes in stable position. 2. Increased interstitial markings bilaterally suggesting CHF. Pneumonitis cannot be excluded . 3. Low lung volumes with basilar atelectasis. Electronically Signed   By: Maisie Fushomas  Register   On: 04/16/2017 07:28   Dg Chest Portable 1 View  Result Date: 04/15/2017 CLINICAL DATA:  Check endotracheal tube placement EXAM: PORTABLE CHEST 1 VIEW COMPARISON:  04/15/2017 FINDINGS: Cardiac shadow is stable. Endotracheal tube is now seen 2 cm above the carina. Right jugular central line is noted in the mid superior vena cava. No pneumothorax is seen. Stable vascular congestion is noted. No focal infiltrate is seen. IMPRESSION: Mild vascular congestion is again seen and stable. Tubes and lines as described. Electronically Signed   By: Alcide CleverMark  Lukens M.D.   On:  04/15/2017 13:20   Dg Chest Port 1 View  Result Date: 04/15/2017 CLINICAL DATA:  Altered mental status. History of stroke. Hypotension and hypoxia. EXAM: PORTABLE CHEST 1 VIEW COMPARISON:  09/24/2016 and 11/01/2014. FINDINGS: 1142 hours. The heart size and mediastinal contours are stable. There is mild aortic atherosclerosis and vascular congestion. No focal airspace disease, pneumothorax or significant pleural effusion identified. The bones appear intact. Telemetry leads overlie the chest. IMPRESSION: Vascular congestion without overt edema or focal airspace disease. Electronically Signed   By: Carey BullocksWilliam  Veazey M.D.   On: 04/15/2017 12:05   Dg Abd Portable 1v  Result Date: 04/15/2017 CLINICAL DATA:  Orogastric tube placement, on ventilator, history diabetes mellitus EXAM: PORTABLE ABDOMEN - 1 VIEW COMPARISON:  Portable exam 1631 hours compared to 10/14/2014 FINDINGS: Tip of orogastric tube projects over stomach. Visualized bowel gas pattern unremarkable. Mild bibasilar opacities question atelectasis versus infiltrate. Bones demineralized. External pacing leads project over chest. IMPRESSION: Tip of orogastric tube projects over stomach. Electronically Signed   By: Ulyses SouthwardMark  Boles M.D.   On: 04/15/2017 17:15     Studies:  Antibiotics: Vancomycin 5/28 >> Cefepime 5/28 >>  Cultures: Blood 5/28 >> Urine 5/28 >> Sputum 5/28 >>  Lines/tubes: ETT 5/28 >> Rt IJ CVL 5/28 >>  Events: 5/28 Admit, DKA, cardiac arrest, VDRF  Summary: 66 yo female with DKA, PEA cardiac arrest, acute renal failure, sepsis, VDRF.    Assessment/plan:  Respiratory: Acute respiratory failure with hypoxia in setting of DKA, cardiac arrest, and possible aspiration. Chest x-ray 5/29 with increased interstitial markings - suggestive of CHF - full vent support -NO SBT  Cardiac: PEA cardiac arrest. Shock - hypovolemic, septic. - continue IV fluids but reduce - pressors to keep MAP > 65 - Echo  Renal: Metabolic  acidosis. Lactic acidosis - resolved Acute renal failure - slightly worsened  Hyperkalemia - resolved Hypokalemia  - continue HCO3 in IV fluid for now - repleat electrolytes   Endocrine: DKA. - continue IV fluid - insulin gtt until anion gap closed x 2, repeat BMET in afternoon - D5 1/2 NS when CBG < 250  Infectious: Septic shock. - recently found to have UTI - although unclear based on UA - day 1 vancomycin, cefepime  Neuro: Acute metabolic encephalopathy. Hx of cognitive impairment, bipolar, CVA 2015. - RASS goal 0 to -1 - hold outpt abilify, cogentin, wellbutrin, remeron  DVT prophylaxis - SQ heparin SUP - protonix Nutrition - NPO Goals of care - full code  -Abigail CorwinJessica Zamora PGY-1  STAFF NOTE: I, Rory Percyaniel Jiya Kissinger, MD FACP have personally reviewed patient's available data, including medical history, events of note, physical examination and test results as part of my evaluation. I have discussed with resident/NP and  other care providers such as pharmacist, RN and RRT. In addition, I personally evaluated patient and elicited key findings of: rass -1, follwed commands for RN,. Moves all etx, lungs ronchi rt greater left, abdo soft, bs low, no r/g, no erythema on ext, pcxr which I reviewed shows ett malpaced will pull back  ett and repeat pcxr, , 16 liters pos, she presents with DKA, MODS, arrest concnerns sepsis as cause although UA unimpressive, pulm wise will change ETT placement, her TV is high for body wt, will reduce TV by 1 cc/kg and maybe further, rate increase needed, pcxr noted some edema?, lower volume in, she remains on levophed but tachy from this at times, will increase neo to max 200 , keep vaso, goal to dc levophed, cvp 13, even balance goals remain, cortisol adequate, no stress roids needed, Ci on noninvasive monitor is about adequate, echo pending, maintain empiric abx, lactic repeat NOT needed, NONAG  Noted, NO AG, bicarb to remain, avoid saline, keep insulin drip  for critical illness hyperglycinemia, not dka further, replacing phos, mag, k, re assess lytes this afternoon, PCT may help in de escalation, feeding to start today, no family at bedside The patient is critically ill with multiple organ systems failure and requires high complexity decision making for assessment and support, frequent evaluation and titration of therapies, application of advanced monitoring technologies and extensive interpretation of multiple databases.   Critical Care Time devoted to patient care services described in this note is 35 Minutes. This time reflects time of care of this signee: Rory Percy, MD FACP. This critical care time does not reflect procedure time, or teaching time or supervisory time of PA/NP/Med student/Med Resident etc but could involve care discussion time. Rest per NP/medical resident whose note is outlined above and that I agree with   Mcarthur Rossetti. Tyson Alias, MD, FACP Pgr: [phone] [facility] 04/16/2017 8:58 AM

## 2017-04-16 NOTE — Progress Notes (Signed)
eLink Physician-Brief Progress Note Patient Name: Narda RutherfordDeborah A Bedel DOB: Apr 11, 1951 MRN: [id number]   Date of Service  04/16/2017  HPI/Events of Note  Supra vent trach on EKG Is fluid responsive after challenge by Flotrac  eICU Interventions  Will give 500cc more NS. Continue fluid resuscitation Wean levo off. Change to neo if unable to get off.      Intervention Category Major Interventions: Arrhythmia - evaluation and management  Madelaine Whipple 04/16/2017, 2:44 AM

## 2017-04-16 NOTE — Procedures (Signed)
Arterial Catheter Insertion Procedure Note Abigail RutherfordDeborah A Zamora [id number] 07/04/1951  Procedure: Insertion of Arterial Catheter  Indications: Blood pressure monitoring and Frequent blood sampling  Procedure Details Consent: Unable to obtain consent because of altered level of consciousness. Time Out: Verified patient identification, verified procedure, site/side was marked, verified correct patient position, special equipment/implants available, medications/allergies/relevent history reviewed, required imaging and test results available.  Performed  Maximum sterile technique was used including antiseptics, cap, gloves, gown, hand hygiene, mask and sheet. Skin prep: Chlorhexidine; local anesthetic administered 20 gauge catheter was inserted into left radial artery using the Seldinger technique.  Evaluation Blood flow good; BP tracing good. Complications: No apparent complications.   Abigail HawthorneBrooker, [name] 04/16/2017

## 2017-04-16 NOTE — Progress Notes (Signed)
eLink Physician-Brief Progress Note Patient Name: Narda RutherfordDeborah A Lant DOB: Jun 09, 1951 MRN: [id number]   Date of Service  04/16/2017  HPI/Events of Note  Notified by bedside nurse to labs had result. Serum potassium 3.6. Has central venous access.   eICU Interventions  KCl 10 mEq IV 4 runs     Intervention Category Major Interventions: Electrolyte abnormality - evaluation and management  Lawanda CousinsJennings Vaughn Frieze 04/16/2017, 10:35 PM

## 2017-04-16 NOTE — Progress Notes (Signed)
CRITICAL VALUE ALERT  Critical Value:  Potassium 2.7 Date & Time Notied:  04/16/17 1320 Provider Notified: NP Mikey BussingHoffman

## 2017-04-16 NOTE — Progress Notes (Addendum)
eLink Physician-Brief Progress Note Patient Name: Abigail RutherfordDeborah A Zamora DOB: Feb 21, 1951 MRN: [id number]   Date of Service  04/16/2017  HPI/Events of Note  K 3.2, Phos < 1, Mg 1.3  eICU Interventions  Replete K, Phos, Mg     Intervention Category Intermediate Interventions: Electrolyte abnormality - evaluation and management  Dayja Loveridge 04/16/2017, 3:54 AM

## 2017-04-16 NOTE — Progress Notes (Signed)
Initial Nutrition Assessment  INTERVENTION:   Start Vital AF 1.2 @ 20 ml/hr and increase by 10 ml every 12 hours to goal rate of 50 ml/hr.  Provides: 1440 kcal, 90 grams protein, and 973 ml free water.   MVI daily  Monitor magnesium, potassium, and phosphorus daily for at least 3 days, MD to replete as needed, as potassium and phosphorus are very low prior to start of enteral nutrition.   NUTRITION DIAGNOSIS:   Inadequate oral intake related to inability to eat as evidenced by NPO status.  GOAL:   Patient will meet greater than or equal to 90% of their needs  MONITOR:   Vent status, I & O's, TF tolerance, Labs  REASON FOR ASSESSMENT:   Consult Enteral/tube feeding initiation and management  ASSESSMENT:   Pt with hx of DM and schizophrenia admitted with DKA, PEA cardiac arrest, ARF, and sepsis.    Spoke with RN at bedside. Adjusting pressors to get pt off one of the three. No family at bedside. Pt opens eyes during exam.   Patient is currently intubated on ventilator support MV: 14.2 L/min Temp (24hrs), Avg:98 F (36.7 C), Min:97.5 F (36.4 C), Max:99.1 F (37.3 C)  Labs reviewed: K+ 2.7, PO4: <1.0, Magnesium 1.3 CBG's: [phone] - remsin on insulin drip MAP: 68-70-80-73 Medications reviewed and include: KCl, magnesium sulfate, potassium phosphate  Vasopressin, phenylephrine, and norepinephrine drips stable  Nutrition-Focused physical exam completed. Findings are no fat depletion, mild/moderate muscle depletion, and no edema.    Diet Order:  Diet NPO time specified  Skin:  Reviewed, no issues  Last BM:  5/28  Height:   Ht Readings from Last 1 Encounters:  04/15/17 5\' 7"  (1.702 m)    Weight:   Wt Readings from Last 1 Encounters:  04/15/17 131 lb 9.8 oz (59.7 kg)    Ideal Body Weight:  61.3 kg  BMI:  Body mass index is 20.61 kg/m.  Estimated Nutritional Needs:   Kcal:  1494  Protein:  80-100 grams  Fluid:  > 1.5 L/day  EDUCATION  NEEDS:   No education needs identified at this time  Kendell BaneHeather Kinley Ferrentino RD, LDN, CNSC [phone] Pager [phone] After Hours Pager

## 2017-04-16 NOTE — Progress Notes (Signed)
  Echocardiogram 2D Echocardiogram has been performed.  [name] T [name] 04/16/2017, 1:43 PM

## 2017-04-16 NOTE — Progress Notes (Signed)
eLink Physician-Brief Progress Note Patient Name: Abigail RutherfordDeborah A Zamora DOB: [birth year] MRN: [id number]   Date of Service  04/16/2017  HPI/Events of Note  Nurse reports increased ectopy. A.m. labs showed hypokalemia and hypomagnesemia.   eICU Interventions  1. Continuing telemetry monitoring 2. Checking stat BMP & magnesium      Intervention Category Major Interventions: Electrolyte abnormality - evaluation and management;Arrhythmia - evaluation and management  Lawanda CousinsJennings Nestor 04/16/2017, 9:32 PM

## 2017-04-16 NOTE — Progress Notes (Signed)
Pt was dyssynchronous with the vent during first rounds. The I-time was increased from 0.8-0.9 with improvement. Dr. Sherene SiresWert requested the I-time to be decreased back to 0.8. This was attempted but the pt began breath stacking immediately. The current I-time is 0.9.

## 2017-04-16 NOTE — Progress Notes (Signed)
eLink Physician-Brief Progress Note Patient Name: Abigail RutherfordDeborah A Zamora DOB: Feb 28, 1951 MRN: [id number]   Date of Service  04/16/2017  HPI/Events of Note  Continue to have tachycardia. HR in 150s Appears regular on tele On vaso and high dose levo CVP 16  eICU Interventions  Get EKG, troponins A line placement Recheck labs, lactic acid, ABG     Intervention Category Major Interventions: Arrhythmia - evaluation and management  [name] 04/16/2017, 12:21 AM

## 2017-04-16 NOTE — Progress Notes (Signed)
Pharmacy Antibiotic Note  Abigail RutherfordDeborah A Zamora is a 66 y.o. female admitted on 04/15/2017 with sepsis.  Pharmacy has been consulted for vancomycin, aztreonam, levofloxacin dosing.  Later changed to vancomycin and cefepime.  Scr rising today, cultures negative so far.  Plan: Reduce cefepime to 1g q 24 hrs. Reduce vancomycin to 500 mg q 24 hrs. Monitor renal function, cultures and clinical course. Vancomycin trough at steady state.   Height: 5\' 7"  (170.2 cm) Weight: 131 lb 9.8 oz (59.7 kg) IBW/kg (Calculated) : 61.6  Temp (24hrs), Avg:98 F (36.7 C), Min:97.5 F (36.4 C), Max:99.1 F (37.3 C)   Recent Labs Lab 04/15/17 1137  04/15/17 1156 04/15/17 1459 04/15/17 1525 04/15/17 1530 04/15/17 1924 04/16/17 0105 04/16/17 0115 04/16/17 0233  WBC 14.4*  --   --   --   --   --   --   --   --  6.8  CREATININE 2.34*  --  1.50* 1.40*  --   --   --   --   --  2.11*  LATICACIDVEN  --   < >  --   --  0.81 1.0 2.7* 1.55 1.8  --   < > = values in this interval not displayed.  Estimated Creatinine Clearance: 25.4 mL/min (A) (by C-G formula based on SCr of 2.11 mg/dL (H)).    Allergies  Allergen Reactions  . Penicillins     Rash     Antimicrobials this admission: Levaquin 5/28 >> 5/28 Vancomycin 5/28 >>  Aztreonam 5/28 >>5/28 Cefepime 5/28 >>   Dose adjustments this admission: n/a   Microbiology results: 5/28 BCx x 2: pending 5/28 UCx: pending 5/28 MRSA PCR: neg  Thank you for allowing pharmacy to be a part of this patient's care.  Tad MooreJessica Avanthika Dehnert, Pharm D, BCPS  Clinical Pharmacist Pager [phone]  04/16/2017 12:22 PM

## 2017-04-16 NOTE — Progress Notes (Signed)
FloTrac set-up complete.

## 2017-04-17 ENCOUNTER — Inpatient Hospital Stay (HOSPITAL_COMMUNITY): Payer: Medicare Other

## 2017-04-17 LAB — GLUCOSE, CAPILLARY
GLUCOSE-CAPILLARY: 119 mg/dL — AB (ref 65–99)
GLUCOSE-CAPILLARY: 125 mg/dL — AB (ref 65–99)
GLUCOSE-CAPILLARY: 141 mg/dL — AB (ref 65–99)
GLUCOSE-CAPILLARY: 148 mg/dL — AB (ref 65–99)
GLUCOSE-CAPILLARY: 165 mg/dL — AB (ref 65–99)
GLUCOSE-CAPILLARY: 173 mg/dL — AB (ref 65–99)
GLUCOSE-CAPILLARY: 73 mg/dL (ref 65–99)
Glucose-Capillary: 122 mg/dL — ABNORMAL HIGH (ref 65–99)
Glucose-Capillary: 127 mg/dL — ABNORMAL HIGH (ref 65–99)
Glucose-Capillary: 132 mg/dL — ABNORMAL HIGH (ref 65–99)
Glucose-Capillary: 143 mg/dL — ABNORMAL HIGH (ref 65–99)
Glucose-Capillary: 147 mg/dL — ABNORMAL HIGH (ref 65–99)
Glucose-Capillary: 147 mg/dL — ABNORMAL HIGH (ref 65–99)
Glucose-Capillary: 159 mg/dL — ABNORMAL HIGH (ref 65–99)
Glucose-Capillary: 168 mg/dL — ABNORMAL HIGH (ref 65–99)
Glucose-Capillary: 197 mg/dL — ABNORMAL HIGH (ref 65–99)
Glucose-Capillary: 222 mg/dL — ABNORMAL HIGH (ref 65–99)

## 2017-04-17 LAB — CBC WITH DIFFERENTIAL/PLATELET
BASOS PCT: 0 %
Basophils Absolute: 0 10*3/uL (ref 0.0–0.1)
EOS ABS: 0 10*3/uL (ref 0.0–0.7)
Eosinophils Relative: 0 %
HCT: 32.5 % — ABNORMAL LOW (ref 36.0–46.0)
HEMOGLOBIN: 11.2 g/dL — AB (ref 12.0–15.0)
LYMPHS PCT: 7 %
Lymphs Abs: 0.8 10*3/uL (ref 0.7–4.0)
MCH: 26.6 pg (ref 26.0–34.0)
MCHC: 34.5 g/dL (ref 30.0–36.0)
MCV: 77.2 fL — ABNORMAL LOW (ref 78.0–100.0)
MONO ABS: 0.4 10*3/uL (ref 0.1–1.0)
Monocytes Relative: 4 %
NEUTROS ABS: 9.7 10*3/uL — AB (ref 1.7–7.7)
Neutrophils Relative %: 89 %
Platelets: 162 10*3/uL (ref 150–400)
RBC: 4.21 MIL/uL (ref 3.87–5.11)
RDW: 18 % — AB (ref 11.5–15.5)
WBC: 10.9 10*3/uL — ABNORMAL HIGH (ref 4.0–10.5)

## 2017-04-17 LAB — POCT I-STAT 3, ART BLOOD GAS (G3+)
Acid-base deficit: <30 mmol/L — ABNORMAL HIGH (ref 0.0–2.0)
BICARBONATE: 2.8 mmol/L — AB (ref 20.0–24.0)
Bicarbonate: 5.3 mmol/L — ABNORMAL LOW (ref 20.0–24.0)
O2 SAT: 27 % — AB (ref 90.0–100.0)
O2 Saturation: 99 % (ref 90.0–100.0)
TCO2: <5 mmol/L (ref 0–100)
TCO2: 7 mmol/L (ref 0–100)
pCO2 arterial: 26.8 mmHg — ABNORMAL LOW (ref 35.0–45.0)
pCO2 arterial: 40.6 mmHg (ref 35.0–45.0)
pH, Arterial: 6.62 — ABNORMAL LOW (ref 7.350–7.400)
pH, Arterial: 6.717 — ABNORMAL LOW (ref 7.350–7.400)
pO2, Arterial: 40 mmHg — CL (ref 83.0–108.0)
pO2, Arterial: 277 mmHg — ABNORMAL HIGH (ref 83.0–108.0)

## 2017-04-17 LAB — COMPREHENSIVE METABOLIC PANEL
ALBUMIN: 1.6 g/dL — AB (ref 3.5–5.0)
ALK PHOS: 223 U/L — AB (ref 38–126)
ALT: 230 U/L — AB (ref 14–54)
AST: 156 U/L — AB (ref 15–41)
Anion gap: 9 (ref 5–15)
BUN: 43 mg/dL — AB (ref 6–20)
CALCIUM: 7.3 mg/dL — AB (ref 8.9–10.3)
CO2: 20 mmol/L — AB (ref 22–32)
CREATININE: 2.81 mg/dL — AB (ref 0.44–1.00)
Chloride: 110 mmol/L (ref 101–111)
GFR calc Af Amer: 19 mL/min — ABNORMAL LOW (ref >60)
GFR calc non Af Amer: 17 mL/min — ABNORMAL LOW (ref >60)
Glucose, Bld: 166 mg/dL — ABNORMAL HIGH (ref 65–99)
Potassium: 4.4 mmol/L (ref 3.5–5.1)
SODIUM: 139 mmol/L (ref 135–145)
Total Bilirubin: 0.7 mg/dL (ref 0.3–1.2)
Total Protein: 4.6 g/dL — ABNORMAL LOW (ref 6.5–8.1)

## 2017-04-17 LAB — PROCALCITONIN: Procalcitonin: 20.09 ng/mL

## 2017-04-17 LAB — PHOSPHORUS: Phosphorus: 1.1 mg/dL — ABNORMAL LOW (ref 2.5–4.6)

## 2017-04-17 MED ORDER — INSULIN GLARGINE 100 UNIT/ML ~~LOC~~ SOLN
20.0000 [IU] | SUBCUTANEOUS | Status: DC
  Administered 2017-04-17 – 2017-04-18 (×2): 20 [IU] via SUBCUTANEOUS
  Filled 2017-04-17 (×3): qty 0.2

## 2017-04-17 MED ORDER — CHLORHEXIDINE GLUCONATE CLOTH 2 % EX PADS
6.0000 | MEDICATED_PAD | Freq: Every day | CUTANEOUS | Status: DC
  Administered 2017-04-17 – 2017-04-18 (×2): 6 via TOPICAL

## 2017-04-17 MED ORDER — INSULIN ASPART 100 UNIT/ML ~~LOC~~ SOLN
1.0000 [IU] | SUBCUTANEOUS | Status: DC
  Administered 2017-04-17: 2 [IU] via SUBCUTANEOUS
  Administered 2017-04-17 – 2017-04-18 (×4): 1 [IU] via SUBCUTANEOUS
  Administered 2017-04-19: 2 [IU] via SUBCUTANEOUS
  Administered 2017-04-19: 1 [IU] via SUBCUTANEOUS
  Administered 2017-04-19 (×2): 2 [IU] via SUBCUTANEOUS

## 2017-04-17 MED ORDER — SODIUM CHLORIDE 0.9% FLUSH: 10.0000 mL | INTRAVENOUS | Status: DC | PRN

## 2017-04-17 MED ORDER — SODIUM CHLORIDE 0.9% FLUSH
10.0000 mL | Freq: Two times a day (BID) | INTRAVENOUS | Status: DC
  Administered 2017-04-17 – 2017-04-18 (×3): 10 mL

## 2017-04-17 MED ORDER — FUROSEMIDE 10 MG/ML IJ SOLN
80.0000 mg | Freq: Two times a day (BID) | INTRAMUSCULAR | Status: DC
  Administered 2017-04-17 (×2): 80 mg via INTRAVENOUS
  Filled 2017-04-17 (×2): qty 8

## 2017-04-17 NOTE — Progress Notes (Addendum)
PCCM History and Physical Note  Admission date: 04/15/2017 Referring provider: Dr. Armond Hang, ER  CC: Altered mental status  HPI: Hx from family, ER staff, and medical record.  Pt hasn't been feeling well for past several weeks.  Family reports her blood sugar normal runs 300 to 500.  She was told she had a urine infection and an infection throughout her body.  Family isn't sure if she picked up antibiotic or whether she has been compliant with medications.  She is followed by Dr. Shawnee Knapp with endocrinology.  She was changed from levemir to tresiba on 03/26/17.  She has been using the bathroom more frequently.  She was noted to be more confused and breathing heavy night prior to admission.  She took extra insulin and went to sleep.  Family couldn't wake her up and brought her to the ER.  She developed PEA arrest with ROSC after about 5 minutes.  She was acidotic, hyperglycemia, hypotension.    Subjective: Doing a wake up assessment this morning.  On levo and neo. Replaced electrolytes overnight.  Had minimal urine output overnight.  Vital signs: BP 110/72   Pulse 84   Temp 98.6 F (37 C) (Oral)   Resp (!) 28   Ht 5\' 7"  (1.702 m)   Wt 165 lb 12.6 oz (75.2 kg)   SpO2 100%   BMI 25.97 kg/m   Intake/output: I/O last 3 completed shifts: In: 11028.3 [I.V.:8168.3; NG/GT:750; IV Piggyback:2110] Out: 300 [Urine:210; Emesis/NG output:90]  General: critically ill appearing Neuro: sedated HEENT: ETT in place Cardiac: HR regular rate and rhythm  Chest: coarse breath sounds throughout  Abd: soft, non tender Ext: no edema  Skin: no rashes   CMP Latest Ref Rng & Units 04/17/2017 04/16/2017 04/16/2017  Glucose 65 - 99 mg/dL 161(W) 960(A) 540(J)  BUN 6 - 20 mg/dL 81(X) 91(Y) 78(G)  Creatinine 0.44 - 1.00 mg/dL 9.56(O) 1.30(Q) 6.57(Q)  Sodium 135 - 145 mmol/L 139 135 138  Potassium 3.5 - 5.1 mmol/L 4.4 3.6 2.7(LL)  Chloride 101 - 111 mmol/L 110 108 108  CO2 22 - 32 mmol/L 20(L) 18(L) 18(L)   Calcium 8.9 - 10.3 mg/dL 7.3(L) 7.1(L) 7.0(L)  Total Protein 6.5 - 8.1 g/dL 4.6(L) - -  Total Bilirubin 0.3 - 1.2 mg/dL 0.7 - -  Alkaline Phos 38 - 126 U/L 223(H) - -  AST 15 - 41 U/L 156(H) - -  ALT 14 - 54 U/L 230(H) - -     CBC Latest Ref Rng & Units 04/17/2017 04/16/2017 04/15/2017  WBC 4.0 - 10.5 K/uL 10.9(H) 6.8 -  Hemoglobin 12.0 - 15.0 g/dL 11.2(L) 11.7(L) 15.0  Hematocrit 36.0 - 46.0 % 32.5(L) 35.0(L) 44.0  Platelets 150 - 400 K/uL 162 153 -     ABG    Component Value Date/Time   PHART 7.327 (L) 04/16/2017 1006   PCO2ART 31.0 (L) 04/16/2017 1006   PO2ART 71.0 (L) 04/16/2017 1006   HCO3 16.3 (L) 04/16/2017 1006   TCO2 17 04/16/2017 1006   ACIDBASEDEF 9.0 (H) 04/16/2017 1006   O2SAT 94.0 04/16/2017 1006     CBG (last 3)   Recent Labs  04/17/17 0428 04/17/17 0534 04/17/17 0636  GLUCAP 165* 143* 119*     Imaging: Dg Chest Port 1 View  Result Date: 04/16/2017 CLINICAL DATA:  Repositioning of endotracheal tube. EXAM: PORTABLE CHEST 1 VIEW COMPARISON:  Portable chest x-ray of earlier today. FINDINGS: The endotracheal tube tip now lies approximately 24.cm above the carina. The lungs are  reasonably well inflated. The interstitial markings remain coarse especially in the lower lobes. The heart is normal in size. There is calcification in the wall of the mitral valvular annulus. The esophagogastric tube tip lies in the gastric cardia. External pacemaker defibrillator pads are present. IMPRESSION: Interval successful repositioning of the endotracheal tube since at the tip now lies 2.4 cm above the carina. Electronically Signed   By: David  [place] M.D.   On: 04/16/2017 09:55   Dg Chest Port 1 View  Result Date: 04/16/2017 CLINICAL DATA:  Intubation.  Respiratory failure . EXAM: PORTABLE CHEST 1 VIEW COMPARISON:  04/15/2017. FINDINGS: Endotracheal tube, NG tube, right IJ line stable position. Heart size stable. Increased interstitial markings suggesting CHF. Pneumonitis  cannot be excluded. Low lung volumes basilar atelectasis. IMPRESSION: 1.  Lines and tubes in stable position. 2. Increased interstitial markings bilaterally suggesting CHF. Pneumonitis cannot be excluded . 3. Low lung volumes with basilar atelectasis. Electronically Signed   By: Maisie Fus  Register   On: 04/16/2017 07:28   Dg Chest Portable 1 View  Result Date: 04/15/2017 CLINICAL DATA:  Check endotracheal tube placement EXAM: PORTABLE CHEST 1 VIEW COMPARISON:  04/15/2017 FINDINGS: Cardiac shadow is stable. Endotracheal tube is now seen 2 cm above the carina. Right jugular central line is noted in the mid superior vena cava. No pneumothorax is seen. Stable vascular congestion is noted. No focal infiltrate is seen. IMPRESSION: Mild vascular congestion is again seen and stable. Tubes and lines as described. Electronically Signed   By: Alcide Clever M.D.   On: 04/15/2017 13:20   Dg Chest Port 1 View  Result Date: 04/15/2017 CLINICAL DATA:  Altered mental status. History of stroke. Hypotension and hypoxia. EXAM: PORTABLE CHEST 1 VIEW COMPARISON:  09/24/2016 and 11/01/2014. FINDINGS: 1142 hours. The heart size and mediastinal contours are stable. There is mild aortic atherosclerosis and vascular congestion. No focal airspace disease, pneumothorax or significant pleural effusion identified. The bones appear intact. Telemetry leads overlie the chest. IMPRESSION: Vascular congestion without overt edema or focal airspace disease. Electronically Signed   By: Carey Bullocks M.D.   On: 04/15/2017 12:05   Dg Abd Portable 1v  Result Date: 04/15/2017 CLINICAL DATA:  Orogastric tube placement, on ventilator, history diabetes mellitus EXAM: PORTABLE ABDOMEN - 1 VIEW COMPARISON:  Portable exam 1631 hours compared to 10/14/2014 FINDINGS: Tip of orogastric tube projects over stomach. Visualized bowel gas pattern unremarkable. Mild bibasilar opacities question atelectasis versus infiltrate. Bones demineralized. External pacing  leads project over chest. IMPRESSION: Tip of orogastric tube projects over stomach. Electronically Signed   By: Ulyses Southward M.D.   On: 04/15/2017 17:15     Studies:  Antibiotics: Vancomycin 5/28 >> Cefepime 5/28 >>  Cultures: Blood 5/28 >> Urine 5/28 >> no growth  Sputum 5/28 >>  Lines/tubes: ETT 5/28 >> Rt IJ CVL 5/28 >>  Events: 5/28 Admit, DKA, cardiac arrest, VDRF  Summary: 66 yo female with DKA, PEA cardiac arrest, acute renal failure, sepsis, VDRF.    Assessment/plan:  Respiratory: Acute respiratory failure with hypoxia in setting of DKA, cardiac arrest, and possible aspiration. - full vent support -NO SBT  Cardiac: PEA cardiac arrest. Shock - hypovolemic, septic. Echo 5/29: LVEF: 35-40%, diffuse hypokinesis, g1dd, increased PA peak pressures CVP 16 - continue IV fluids  - pressors to keep MAP > 65   Renal: Metabolic acidosis - resolved Lactic acidosis - resolved Acute renal failure - slightly worsened  Hyperkalemia - resolved Hypokalemia - resolved - continue HCO3 in IV  fluid for now - repleat electrolytes as needed  GI Elevated LFTs Tube feedings -CMP in am -protonix - consider increasing fluids  Endocrine: DKA. - continue IV fluid - discontinue insulin drip   Infectious: Septic shock. procalcitonin 20  - recently found to have UTI - although unclear based on UA - day 2 vancomycin, cefepime  Neuro: Acute metabolic encephalopathy. Hx of cognitive impairment, bipolar, CVA 2015. - RASS goal 0 to -1 - hold outpt abilify, cogentin, wellbutrin, remeron  DVT prophylaxis - SQ heparin SUP - protonix Nutrition - tube feedings Goals of care - full code  -Geralyn CorwinJessica Hoffman PGY-1  STAFF NOTE: I, Rory Percyaniel Feinstein, MD FACP have personally reviewed patient's available data, including medical history, events of note, physical examination and test results as part of my evaluation. I have discussed with resident/NP and other care providers such as  pharmacist, RN and RRT. In addition, I personally evaluated patient and elicited key findings of: awake, fc, lungs coarse rt greater left,a bdo soft, cough present, no rash, sputum present, now with increaxsed infiltrate on pcxr that I reviewed, PCt was sig and now down, she is off levo, maintain neo / vaso to goals, was 6 liters pos, CI noted and echo confirmed low ef, would kvo, limit further bolus, weaning aggressive now with improved neuro status and BP, keep bicarb for small NONA at 50 cc/hr, chem in pm , remains culture neg, dc vanc, maintain cefepime,assess last hospital stay, goal is extubatio jif able as BP is improving, output is down, EF low, lasix challenge The patient is critically ill with multiple organ systems failure and requires high complexity decision making for assessment and support, frequent evaluation and titration of therapies, application of advanced monitoring technologies and extensive interpretation of multiple databases.   Critical Care Time devoted to patient care services described in this note is 30 Minutes. This time reflects time of care of this signee: Rory Percyaniel Feinstein, MD FACP. This critical care time does not reflect procedure time, or teaching time or supervisory time of PA/NP/Med student/Med Resident etc but could involve care discussion time. Rest per NP/medical resident whose note is outlined above and that I agree with   Mcarthur Rossettianiel J. [name], MD, FACP Pgr: [phone] [facility] 04/17/2017 10:09 AM

## 2017-04-17 NOTE — Progress Notes (Signed)
eLink Physician-Brief Progress Note Patient Name: [name] DOB: 09-15-1951 MRN: [id number]   Date of Service  04/17/2017  HPI/Events of Note  Camera check on patient postextubation. Sleeping with eyes closed. Currently on nasal cannula oxygen. Normal work of breathing.   eICU Interventions  Continuing close monitoring postextubation.      Intervention Category Major Interventions: Respiratory failure - evaluation and management  Lawanda CousinsJennings Asyia Hornung 04/17/2017, 9:57 PM

## 2017-04-17 NOTE — Procedures (Signed)
Extubation Procedure Note  Patient Details:   Name: [name] DOB: June 29, 1951 MRN: [id number]   Airway Documentation:     Evaluation  O2 sats: stable throughout Complications: No apparent complications Patient did tolerate procedure well. Bilateral Breath Sounds: Clear, Diminished, Other (Comment) (slight exp wheeze)   Yes   Positive cuff leak noted. Pt placed on [place] 4Lpm with humidity, no stridor noted, tolerating well at this time.  Forest BeckerJean S Joene Gelder 04/17/2017, 10:52 AM

## 2017-04-18 ENCOUNTER — Inpatient Hospital Stay (HOSPITAL_COMMUNITY): Payer: Medicare Other

## 2017-04-18 ENCOUNTER — Encounter (HOSPITAL_COMMUNITY): Payer: Self-pay | Admitting: *Deleted

## 2017-04-18 DIAGNOSIS — E872 Acidosis: Secondary | ICD-10-CM

## 2017-04-18 DIAGNOSIS — J9602 Acute respiratory failure with hypercapnia: Secondary | ICD-10-CM | POA: Diagnosis present

## 2017-04-18 LAB — COMPREHENSIVE METABOLIC PANEL
ALK PHOS: 189 U/L — AB (ref 38–126)
ALT: 157 U/L — AB (ref 14–54)
ANION GAP: 12 (ref 5–15)
AST: 88 U/L — ABNORMAL HIGH (ref 15–41)
Albumin: 1.7 g/dL — ABNORMAL LOW (ref 3.5–5.0)
BILIRUBIN TOTAL: 0.9 mg/dL (ref 0.3–1.2)
BUN: 50 mg/dL — ABNORMAL HIGH (ref 6–20)
CALCIUM: 7.5 mg/dL — AB (ref 8.9–10.3)
CO2: 22 mmol/L (ref 22–32)
CREATININE: 3.73 mg/dL — AB (ref 0.44–1.00)
Chloride: 106 mmol/L (ref 101–111)
GFR calc Af Amer: 14 mL/min — ABNORMAL LOW (ref >60)
GFR calc non Af Amer: 12 mL/min — ABNORMAL LOW (ref >60)
Glucose, Bld: 145 mg/dL — ABNORMAL HIGH (ref 65–99)
Potassium: 3.8 mmol/L (ref 3.5–5.1)
Sodium: 140 mmol/L (ref 135–145)
TOTAL PROTEIN: 4.7 g/dL — AB (ref 6.5–8.1)

## 2017-04-18 LAB — CULTURE, RESPIRATORY

## 2017-04-18 LAB — GLUCOSE, CAPILLARY
GLUCOSE-CAPILLARY: 130 mg/dL — AB (ref 65–99)
GLUCOSE-CAPILLARY: 120 mg/dL — AB (ref 65–99)
Glucose-Capillary: 105 mg/dL — ABNORMAL HIGH (ref 65–99)
Glucose-Capillary: 131 mg/dL — ABNORMAL HIGH (ref 65–99)
Glucose-Capillary: 143 mg/dL — ABNORMAL HIGH (ref 65–99)
Glucose-Capillary: 98 mg/dL (ref 65–99)

## 2017-04-18 LAB — CBC
HCT: 28.5 % — ABNORMAL LOW (ref 36.0–46.0)
Hemoglobin: 9.5 g/dL — ABNORMAL LOW (ref 12.0–15.0)
MCH: 26.2 pg (ref 26.0–34.0)
MCHC: 33.3 g/dL (ref 30.0–36.0)
MCV: 78.5 fL (ref 78.0–100.0)
Platelets: 88 10*3/uL — ABNORMAL LOW (ref 150–400)
RBC: 3.63 MIL/uL — AB (ref 3.87–5.11)
RDW: 18.3 % — ABNORMAL HIGH (ref 11.5–15.5)
WBC: 6.6 10*3/uL (ref 4.0–10.5)

## 2017-04-18 LAB — TROPONIN I: Troponin I: 0.18 ng/mL (ref <0.03)

## 2017-04-18 LAB — PROCALCITONIN: Procalcitonin: 11.18 ng/mL

## 2017-04-18 LAB — CULTURE, RESPIRATORY W GRAM STAIN

## 2017-04-18 LAB — PHOSPHORUS: Phosphorus: 1.8 mg/dL — ABNORMAL LOW (ref 2.5–4.6)

## 2017-04-18 LAB — MAGNESIUM: Magnesium: 2.3 mg/dL (ref 1.7–2.4)

## 2017-04-18 MED ORDER — ALBUTEROL SULFATE (2.5 MG/3ML) 0.083% IN NEBU
2.5000 mg | INHALATION_SOLUTION | RESPIRATORY_TRACT | Status: DC | PRN
  Administered 2017-04-18 (×3): 2.5 mg via RESPIRATORY_TRACT
  Filled 2017-04-18 (×3): qty 3

## 2017-04-18 MED ORDER — CHLORHEXIDINE GLUCONATE 0.12 % MT SOLN
15.0000 mL | Freq: Two times a day (BID) | OROMUCOSAL | Status: DC
  Administered 2017-04-18 – 2017-04-25 (×14): 15 mL via OROMUCOSAL
  Filled 2017-04-18 (×14): qty 15

## 2017-04-18 MED ORDER — ORAL CARE MOUTH RINSE
15.0000 mL | Freq: Two times a day (BID) | OROMUCOSAL | Status: DC
Start: 2017-04-18 — End: 2017-04-18
  Administered 2017-04-18: 15 mL via OROMUCOSAL

## 2017-04-18 MED ORDER — GUAIFENESIN-DM 100-10 MG/5ML PO SYRP
5.0000 mL | ORAL_SOLUTION | ORAL | Status: DC | PRN
  Administered 2017-04-21 – 2017-04-25 (×5): 5 mL via ORAL
  Filled 2017-04-18 (×5): qty 5

## 2017-04-18 MED ORDER — ORAL CARE MOUTH RINSE
15.0000 mL | Freq: Two times a day (BID) | OROMUCOSAL | Status: DC
  Administered 2017-04-18 – 2017-04-25 (×11): 15 mL via OROMUCOSAL

## 2017-04-18 MED ORDER — ACETAMINOPHEN 325 MG PO TABS
650.0000 mg | ORAL_TABLET | Freq: Four times a day (QID) | ORAL | Status: DC | PRN
  Administered 2017-04-18 – 2017-04-25 (×6): 650 mg via ORAL
  Filled 2017-04-18 (×6): qty 2

## 2017-04-18 MED ORDER — DEXTROSE 5 % IV SOLN
INTRAVENOUS | Status: DC
  Administered 2017-04-18 – 2017-04-19 (×2): via INTRAVENOUS

## 2017-04-18 MED ORDER — DEXTROSE 5 % IV SOLN
1.0000 g | INTRAVENOUS | Status: DC
  Administered 2017-04-18: 1 g via INTRAVENOUS
  Filled 2017-04-18 (×2): qty 10

## 2017-04-18 NOTE — Progress Notes (Signed)
CRITICAL VALUE ALERT  Critical Value: Tropin-0.18  Date & Time Notied: 22.15 04/18/2017  Provider Notified: Staci AcostaJ. Nestor  Orders Received/Actions taken: No new orders

## 2017-04-18 NOTE — Progress Notes (Signed)
Dr. [name] notified of no void since 8 am when foley discontinued, and bladder scan volume 210 cc.  Will recheck bladder scan in 4 hours.

## 2017-04-18 NOTE — Progress Notes (Signed)
230mL of Fentanyl wasted down the sink with Hessie DibbleKristen Lanier, RN

## 2017-04-18 NOTE — Evaluation (Signed)
Clinical/Bedside Swallow Evaluation Patient Details  Name: Abigail RutherfordDeborah A Dendinger MRN: [id number] Date of Birth: August 29, 1951  Today's Date: 04/18/2017 Time: SLP Start Time (ACUTE ONLY): 1552 SLP Stop Time (ACUTE ONLY): 1602 SLP Time Calculation (min) (ACUTE ONLY): 10 min  Past Medical History:  Past Medical History:  Diagnosis Date  . Allergy   . Diabetes mellitus without complication (HCC)   . Mixed stress and urge urinary incontinence   . Pneumonia   . Schizophrenia [facility](HCC)    Past Surgical History: History reviewed. No pertinent surgical history. HPI:  66 yo female with DKA, PEA cardiac arrest, acute renal failure, sepsis, VDRF 5/28-5/30. Hx of cognitive impairment, bipolar, CVA in 2015. Note MBS complete in 2015 during admission for similar dx, noted severe oropharyngeal dysphagia.   Assessment / Plan / Recommendation Clinical Impression  Patient with baseline coughing making bedside diagnostics difficult however notable increase in coughing with po intake. Patient hoarse indicating decreased glottal closure s/p 2 day intubation. Dysphagia, if present, is likely acute and reversible in nature as per patient, she has been consuming a regular diet with thin liquids prior to admission without difficulty. Recommend NPO with FEES in am 6/1 to determine least restrictive diet.   SLP Visit Diagnosis: Dysphagia, unspecified (R13.10)    Aspiration Risk       Diet Recommendation NPO   Medication Administration: Via alternative means    Other  Recommendations Oral Care Recommendations: Oral care QID   Follow up Recommendations Other (comment) (TBD)             Prognosis Prognosis for Safe Diet Advancement: Good      Swallow Study   General HPI: 66 yo female with DKA, PEA cardiac arrest, acute renal failure, sepsis, VDRF 5/28-5/30. Hx of cognitive impairment, bipolar, CVA in 2015. Note MBS complete in 2015 during admission for similar dx, noted severe oropharyngeal dysphagia. Type of  Study: Bedside Swallow Evaluation Previous Swallow Assessment: see HPI Diet Prior to this Study: NPO Temperature Spikes Noted: No Respiratory Status: Nasal cannula History of Recent Intubation: Yes Length of Intubations (days): 2 days Date extubated: 04/17/17 Behavior/Cognition: Alert;Cooperative;Pleasant mood Oral Cavity Assessment: Within Functional Limits Oral Care Completed by SLP: Recent completion by staff Vision: Functional for self-feeding Self-Feeding Abilities: Able to feed self Patient Positioning: Upright in bed Baseline Vocal Quality: Hoarse Volitional Cough: Strong Volitional Swallow: Able to elicit    Oral/Motor/Sensory Function Overall Oral Motor/Sensory Function: Within functional limits   Ice Chips Ice chips: Impaired Presentation: Spoon Pharyngeal Phase Impairments: Cough - Immediate   Thin Liquid Thin Liquid: Impaired Presentation: Cup;Self Fed Pharyngeal  Phase Impairments: Cough - Immediate    Nectar Thick Nectar Thick Liquid: Not tested   Honey Thick Honey Thick Liquid: Not tested   Puree Puree: Impaired Presentation: Spoon Pharyngeal Phase Impairments: Cough - Immediate   Solid   Cedra Villalon MA, CCC-SLP [phone]    Solid: Not tested        Jhoel Stieg Meryl 04/18/2017,4:06 PM

## 2017-04-18 NOTE — Progress Notes (Signed)
eLink Physician-Brief Progress Note Patient Name: Narda RutherfordDeborah A Dec DOB: 19-May-1951 MRN: [id number]   Date of Service  04/18/2017  HPI/Events of Note  Notified by nurse that troponin I 0.18. Pain persistent and currently rated at 5/10. No change in vitals or oxygen requirement. Previous troponin I 0.182 days ago. Patient has notably worsening renal function. Doubt cardiac in etiology given previous description.   eICU Interventions  1. Continuing to trend troponin I every 6 hours 2. Continuous telemetry monitoring 3. Holding off on systemic anticoagulation      Intervention Category Intermediate Interventions: Diagnostic test evaluation  Lawanda CousinsJennings Elissia Spiewak 04/18/2017, 10:28 PM

## 2017-04-18 NOTE — Progress Notes (Signed)
Nutrition Follow-up  INTERVENTION:   Plan to supplement diet once advanced  If enteral nutrition required recommend: Jevity 1.2 @ 50 ml/hr (1200 ml/day) 30 ml Prostat BID Provides: 1640 kcal, 96 grams protein, and 972 ml free water.   NUTRITION DIAGNOSIS:   Inadequate oral intake related to inability to eat as evidenced by NPO status. Ongoing.   GOAL:   Patient will meet greater than or equal to 90% of their needs Progressing.   MONITOR:   Vent status, I & O's, TF tolerance, Labs  ASSESSMENT:   Pt with hx of DM and schizophrenia admitted with DKA, PEA cardiac arrest, ARF, and sepsis.   5/30 extubated Now waiting on swallow eval Labs reviewed: PO4 1.8  Weaning pressors  Diet Order:  Diet NPO time specified  Skin:  Reviewed, no issues  Last BM:  5/31  Height:   Ht Readings from Last 1 Encounters:  04/15/17 5\' 7"  (1.702 m)    Weight:   Wt Readings from Last 1 Encounters:  04/18/17 167 lb 1.7 oz (75.8 kg)    Ideal Body Weight:  61.3 kg  BMI:  Body mass index is 26.17 kg/m.  Estimated Nutritional Needs:   Kcal:  1500-1700  Protein:  80-100 grams  Fluid:  > 1.5 L/day  EDUCATION NEEDS:   No education needs identified at this time  Kendell BaneHeather Reika Callanan RD, LDN, CNSC [phone] Pager [phone] After Hours Pager

## 2017-04-18 NOTE — Progress Notes (Addendum)
PCCM History and Physical Note  Admission date: 04/15/2017 Referring provider: Dr. Armond HangWoodrum, ER  CC: Altered mental status  HPI: Hx from family, ER staff, and medical record.  Pt hasn't been feeling well for past several weeks.  Family reports her blood sugar normal runs 300 to 500.  She was told she had a urine infection and an infection throughout her body.  Family isn't sure if she picked up antibiotic or whether she has been compliant with medications.  She is followed by Dr. Shawnee KnappLevy with endocrinology.  She was changed from levemir to tresiba on 03/26/17.  She has been using the bathroom more frequently.  She was noted to be more confused and breathing heavy night prior to admission.  She took extra insulin and went to sleep.  Family couldn't wake her up and brought her to the ER.  She developed PEA arrest with ROSC after about 5 minutes.  She was acidotic, hyperglycemia, hypotension.    Subjective: Extubated yesterday, coughing overnight with albuterol nebulizer treatments given.  Off insulin drip.  On pressors.  Vital signs: BP 96/70   Pulse 98   Temp 97.7 F (36.5 C) (Oral)   Resp (!) 22   Ht 5\' 7"  (1.702 m)   Wt 167 lb 1.7 oz (75.8 kg)   SpO2 91%   BMI 26.17 kg/m   Intake/output: I/O last 3 completed shifts: In: 5612.9 [I.V.:4512.9; NG/GT:800; IV Piggyback:300] Out: 335 [Urine:335]  General: awake, coughing  Neuro: alert, moving all extremities  HEENT: on [place], neck supple Cardiac: HR regular rate and rhythm  Chest: coarse breath sounds throughout, R>L  Abd: soft, non tender Ext: trace edema  Skin: no rashes   CMP Latest Ref Rng & Units 04/18/2017 04/17/2017 04/16/2017  Glucose 65 - 99 mg/dL 213(Y145(H) 865(H166(H) 846(N276(H)  BUN 6 - 20 mg/dL 62(X50(H) 52(W43(H) 41(L39(H)  Creatinine 0.44 - 1.00 mg/dL 2.44(W3.73(H) 1.02(V2.81(H) 2.53(G2.55(H)  Sodium 135 - 145 mmol/L 140 139 135  Potassium 3.5 - 5.1 mmol/L 3.8 4.4 3.6  Chloride 101 - 111 mmol/L 106 110 108  CO2 22 - 32 mmol/L 22 20(L) 18(L)  Calcium 8.9 - 10.3  mg/dL 7.5(L) 7.3(L) 7.1(L)  Total Protein 6.5 - 8.1 g/dL 4.7(L) 4.6(L) -  Total Bilirubin 0.3 - 1.2 mg/dL 0.9 0.7 -  Alkaline Phos 38 - 126 U/L 189(H) 223(H) -  AST 15 - 41 U/L 88(H) 156(H) -  ALT 14 - 54 U/L 157(H) 230(H) -     CBC Latest Ref Rng & Units 04/18/2017 04/17/2017 04/16/2017  WBC 4.0 - 10.5 K/uL 6.6 10.9(H) 6.8  Hemoglobin 12.0 - 15.0 g/dL 6.4(Q9.5(L) 11.2(L) 11.7(L)  Hematocrit 36.0 - 46.0 % 28.5(L) 32.5(L) 35.0(L)  Platelets 150 - 400 K/uL PENDING 162 153     ABG    Component Value Date/Time   PHART 7.327 (L) 04/16/2017 1006   PCO2ART 31.0 (L) 04/16/2017 1006   PO2ART 71.0 (L) 04/16/2017 1006   HCO3 16.3 (L) 04/16/2017 1006   TCO2 17 04/16/2017 1006   ACIDBASEDEF 9.0 (H) 04/16/2017 1006   O2SAT 94.0 04/16/2017 1006     CBG (last 3)   Recent Labs  04/17/17 1927 04/17/17 2350 04/18/17 0344  GLUCAP 148* 73 130*     Imaging: Dg Chest Port 1 View  Result Date: 04/18/2017 CLINICAL DATA:  Hypoxia. EXAM: PORTABLE CHEST 1 VIEW COMPARISON:  04/17/2017 . FINDINGS: Interim extubation and removal of NG tube. Right IJ line stable position. Cardiomegaly with diffuse bilateral from interstitial prominence noted on today's  exam. Small left pleural effusion . Findings consistent with mild CHF. IMPRESSION: 1. Interim extubation removal of NG tube. Right IJ line stable position. 2. Cardiomegaly with diffuse bilateral from interstitial prominence noted on today's exam. Small left pleural effusion. Findings consistent CHF. Electronically Signed   By: Maisie Fus  Register   On: 04/18/2017 06:36   Dg Chest Port 1 View  Result Date: 04/17/2017 CLINICAL DATA:  Hypoxia EXAM: PORTABLE CHEST 1 VIEW COMPARISON:  Apr 16, 2017 FINDINGS: Endotracheal tube tip is 1.2 cm above the carina. Nasogastric tube tip and side port are below the diaphragm. Central catheter tip is in the superior vena cava near the cavoatrial junction. No pneumothorax. There is patchy airspace opacity in the right base with  small right pleural effusion. The left lung is clear. Heart is upper normal in size with pulmonary vascularity within normal limits. No adenopathy evident. No bone lesions. IMPRESSION: Tube and catheter positions as described without pneumothorax. Note that the endotracheal tube tip is fairly close to the carina ; it may be prudent to consider withdrawing endotracheal tube approximately 2 cm. There is patchy opacity in the right base which may represent early pneumonia or possibly aspiration. Small right pleural effusion evident. Left lung clear. Stable cardiac silhouette. Electronically Signed   By: Bretta Bang III M.D.   On: 04/17/2017 07:53   Dg Chest Port 1 View  Result Date: 04/16/2017 CLINICAL DATA:  Repositioning of endotracheal tube. EXAM: PORTABLE CHEST 1 VIEW COMPARISON:  Portable chest x-ray of earlier today. FINDINGS: The endotracheal tube tip now lies approximately 24.cm above the carina. The lungs are reasonably well inflated. The interstitial markings remain coarse especially in the lower lobes. The heart is normal in size. There is calcification in the wall of the mitral valvular annulus. The esophagogastric tube tip lies in the gastric cardia. External pacemaker defibrillator pads are present. IMPRESSION: Interval successful repositioning of the endotracheal tube since at the tip now lies 2.4 cm above the carina. Electronically Signed   By: David  [place] M.D.   On: 04/16/2017 09:55     Studies:  Antibiotics: Vancomycin 5/28 >> 5/29 Cefepime 5/28 >>  Cultures: Blood 5/28 >> Urine 5/28 >> no growth  Sputum 5/28 >> streptococcus agalactiae  Lines/tubes: ETT 5/28 >> 5/30 Rt IJ CVL 5/28 >>  Events: 5/28 Admit, DKA, cardiac arrest, VDRF  Summary: 66 yo female with DKA, PEA cardiac arrest, acute renal failure, sepsis, VDRF.    Assessment/plan:  Respiratory: Acute respiratory failure with hypoxia in setting of DKA, cardiac arrest, and possible aspiration. - on [place] -  albuterol nebulizer  Cardiac: PEA cardiac arrest. Shock - hypovolemic, septic. Echo 5/29: LVEF: 35-40%, diffuse hypokinesis, g1dd, increased PA peak pressures CVP 16 - continue IV fluids  - pressors to keep MAP > 65   Renal: Metabolic acidosis - resolved Lactic acidosis - resolved Acute renal failure - continues to worsen  Hyperkalemia - resolved Hypokalemia - resolved -  HCO3 in IV fluid  - repleat electrolytes as needed  GI Elevated LFTs -CMP in am -protonix  Endocrine: DKA - resolved - continue IV fluid  Infectious: Septic shock. procalcitonin 20 Streptococcus agalactiae sputum culture  - recently found to have UTI - although unclear based on UA - day 3 cefepime  Neuro: Acute metabolic encephalopathy. Hx of cognitive impairment, bipolar, CVA 2015. - hold outpt abilify, cogentin, wellbutrin, remeron  DVT prophylaxis - SQ heparin SUP - protonix Nutrition - NPO post extubation, on D5W Goals of care - full  code  -Geralyn Corwin PGY-1   STAFF NOTE: I, Rory Percy, MD FACP have personally reviewed patient's available data, including medical history, events of note, physical examination and test results as part of my evaluation. I have discussed with resident/NP and other care providers such as pharmacist, RN and RRT. In addition, I personally evaluated patient and elicited key findings of: awake, smiling, no distress, less coarse BS, pos 2.5 liters last 24 hours, extubation was successful, she remains on pressors,MAp goal 55 with good MS, wean  Neo off NOW as able, then vaso if able, she is perfusing well, wold like to dc line asap once off pressors, a line is out, diet add with SLP as some coughing, lantus, SSI, ATN remains, hope will reduce in am , lasix to hold today, kvo all fluids, assess renal US, cvp was 16  So lasix was provided, plat down dilution likely, sirs response, cortisol  Was adequate, no role stress roids, dc bicarb as NONAG treated,group BN  strep PNA likely cause of declines, narrow abx, will need 7 days ABX The patient is critically ill with multiple organ systems failure and requires high complexity decision making for assessment and support, frequent evaluation and titration of therapies, application of advanced monitoring technologies and extensive interpretation of multiple databases.   Critical Care Time devoted to patient care services described in this note is 30 Minutes. This time reflects time of care of this signee: Rory Percy, MD FACP. This critical care time does not reflect procedure time, or teaching time or supervisory time of PA/NP/Med student/Med Resident etc but could involve care discussion time. Rest per NP/medical resident whose note is outlined above and that I agree with   Mcarthur Rossetti. Tyson Alias, MD, FACP Pgr: [phone] [facility] 04/18/2017 9:11 AM

## 2017-04-18 NOTE — Progress Notes (Signed)
Paged MD [name] to inform of chest pain. Pt rates pain 4/10 and states it "just started 15 minutes ago".  No PRN medications for pain.   Passed on information to night RN   Leonia ReevesNatalie N Talon Regala, RN

## 2017-04-18 NOTE — Progress Notes (Signed)
eLink Physician-Brief Progress Note Patient Name: Abigail RutherfordDeborah A Zamora DOB: [birth year] MRN: [id number]   Date of Service  04/18/2017  HPI/Events of Note  Hypoglycemia - Blood glucose = 73. No enteral nutrition. Patient is NPO.   eICU Interventions  Will order: 1. D5W to run IV at 40 mL/hour.      Intervention Category Major Interventions: Other:  Lenell AntuSommer,Steven Eugene 04/18/2017, 12:55 AM

## 2017-04-18 NOTE — Progress Notes (Signed)
[name] notified that bladder scan volume is less that 300cc and no void since 8 am foley discontinued. Will continue to monitor pt closely.

## 2017-04-18 NOTE — Progress Notes (Signed)
eLink Physician-Brief Progress Note Patient Name: Abigail RutherfordDeborah A Zamora DOB: 1950-12-19 MRN: [id number]   Date of Service  04/18/2017  HPI/Events of Note  Contacted by bedside nurse regarding ongoing chest pain. Pain is described as pleuritic in nature and worse with deep inspiration. Mildly tachycardic at 105 bpm & systolic blood pressure 110. Saturating normally on nasal cannula oxygen. Pain has been escalating in intensity over approximately 20 minutes and is now rated as 8 out of 10.   eICU Interventions  1. Checking stat portable chest x-ray 2. Checking stat EKG 3. Checking troponin I every 6 hours 4. Tylenol when necessary pain/holding on narcotics      Intervention Category Major Interventions: Other:  [name] 04/18/2017, 7:47 PM

## 2017-04-18 NOTE — Progress Notes (Signed)
eLink Physician-Brief Progress Note Patient Name: Abigail RutherfordDeborah A Zamora DOB: 1951-04-04 MRN: [id number]   Date of Service  04/18/2017  HPI/Events of Note  Cough - Request for Albuterol Nebs.   eICU Interventions  Will order Albuterol Nebs Q 2 hours PRN.      Intervention Category Intermediate Interventions: Respiratory distress - evaluation and management  Jinnie Onley Eugene 04/18/2017, 2:44 AM

## 2017-04-18 NOTE — Progress Notes (Signed)
Patient coughing and lung sounds are Rhonci with expiratory wheezes. Oxygen saturation monitor not picking up well. Spot checked oxygen and it read 90 on 2L. RN increased Oxygen to 4L. Patient says, "I am breathing okay." However, this RN noted that she seemed to be in distress. Arsenio LoaderSommer, MD called and notified. Order to give Albuterol Nebulizer. Will ask RT to give nebulizer. Will continue to monitor patient for further distress.

## 2017-04-19 ENCOUNTER — Inpatient Hospital Stay (HOSPITAL_COMMUNITY): Payer: Medicare Other

## 2017-04-19 DIAGNOSIS — I469 Cardiac arrest, cause unspecified: Secondary | ICD-10-CM

## 2017-04-19 DIAGNOSIS — E0810 Diabetes mellitus due to underlying condition with ketoacidosis without coma: Secondary | ICD-10-CM

## 2017-04-19 DIAGNOSIS — N170 Acute kidney failure with tubular necrosis: Secondary | ICD-10-CM

## 2017-04-19 DIAGNOSIS — G931 Anoxic brain damage, not elsewhere classified: Secondary | ICD-10-CM

## 2017-04-19 DIAGNOSIS — N179 Acute kidney failure, unspecified: Secondary | ICD-10-CM | POA: Diagnosis not present

## 2017-04-19 DIAGNOSIS — I5023 Acute on chronic systolic (congestive) heart failure: Secondary | ICD-10-CM | POA: Diagnosis not present

## 2017-04-19 DIAGNOSIS — D696 Thrombocytopenia, unspecified: Secondary | ICD-10-CM

## 2017-04-19 LAB — BASIC METABOLIC PANEL
Anion gap: 11 (ref 5–15)
BUN: 58 mg/dL — AB (ref 6–20)
CALCIUM: 7.6 mg/dL — AB (ref 8.9–10.3)
CHLORIDE: 105 mmol/L (ref 101–111)
CO2: 22 mmol/L (ref 22–32)
CREATININE: 4.5 mg/dL — AB (ref 0.44–1.00)
GFR, EST AFRICAN AMERICAN: 11 mL/min — AB (ref >60)
GFR, EST NON AFRICAN AMERICAN: 9 mL/min — AB (ref >60)
Glucose, Bld: 92 mg/dL (ref 65–99)
Potassium: 4.1 mmol/L (ref 3.5–5.1)
SODIUM: 138 mmol/L (ref 135–145)

## 2017-04-19 LAB — GLUCOSE, CAPILLARY
GLUCOSE-CAPILLARY: 86 mg/dL (ref 65–99)
GLUCOSE-CAPILLARY: 126 mg/dL — AB (ref 65–99)
GLUCOSE-CAPILLARY: 197 mg/dL — AB (ref 65–99)
Glucose-Capillary: 101 mg/dL — ABNORMAL HIGH (ref 65–99)
Glucose-Capillary: 156 mg/dL — ABNORMAL HIGH (ref 65–99)
Glucose-Capillary: 193 mg/dL — ABNORMAL HIGH (ref 65–99)
Glucose-Capillary: 67 mg/dL (ref 65–99)

## 2017-04-19 LAB — CBC
HCT: 27.6 % — ABNORMAL LOW (ref 36.0–46.0)
Hemoglobin: 9.4 g/dL — ABNORMAL LOW (ref 12.0–15.0)
MCH: 27 pg (ref 26.0–34.0)
MCHC: 34.1 g/dL (ref 30.0–36.0)
MCV: 79.3 fL (ref 78.0–100.0)
PLATELETS: 93 10*3/uL — AB (ref 150–400)
RBC: 3.48 MIL/uL — ABNORMAL LOW (ref 3.87–5.11)
RDW: 18.2 % — AB (ref 11.5–15.5)
WBC: 6.3 10*3/uL (ref 4.0–10.5)

## 2017-04-19 LAB — TROPONIN I
Troponin I: 0.2 ng/mL (ref <0.03)
Troponin I: 0.17 ng/mL (ref <0.03)

## 2017-04-19 MED ORDER — ARIPIPRAZOLE 5 MG PO TABS
15.0000 mg | ORAL_TABLET | Freq: Every day | ORAL | Status: DC
  Administered 2017-04-19 – 2017-04-25 (×7): 15 mg via ORAL
  Filled 2017-04-19 (×7): qty 3

## 2017-04-19 MED ORDER — MIRTAZAPINE 15 MG PO TABS
15.0000 mg | ORAL_TABLET | Freq: Every day | ORAL | Status: DC
  Administered 2017-04-19 – 2017-04-24 (×6): 15 mg via ORAL
  Filled 2017-04-19 (×6): qty 1

## 2017-04-19 MED ORDER — GLUCAGON HCL RDNA (DIAGNOSTIC) 1 MG IJ SOLR
INTRAMUSCULAR | Status: AC
  Administered 2017-04-19: 08:00:00
  Filled 2017-04-19: qty 1

## 2017-04-19 MED ORDER — INSULIN GLARGINE 100 UNIT/ML ~~LOC~~ SOLN
10.0000 [IU] | SUBCUTANEOUS | Status: DC
  Administered 2017-04-19 – 2017-04-20 (×2): 10 [IU] via SUBCUTANEOUS
  Filled 2017-04-19 (×3): qty 0.1

## 2017-04-19 MED ORDER — BUPROPION HCL 75 MG PO TABS
75.0000 mg | ORAL_TABLET | Freq: Two times a day (BID) | ORAL | Status: DC
  Administered 2017-04-19 – 2017-04-25 (×12): 75 mg via ORAL
  Filled 2017-04-19 (×13): qty 1

## 2017-04-19 MED ORDER — CEFPODOXIME PROXETIL 200 MG PO TABS
200.0000 mg | ORAL_TABLET | ORAL | Status: DC
  Administered 2017-04-19 – 2017-04-21 (×3): 200 mg via ORAL
  Filled 2017-04-19 (×3): qty 1

## 2017-04-19 MED ORDER — FUROSEMIDE 40 MG PO TABS
40.0000 mg | ORAL_TABLET | Freq: Every day | ORAL | Status: DC
  Administered 2017-04-19 – 2017-04-20 (×2): 40 mg via ORAL
  Filled 2017-04-19 (×2): qty 1

## 2017-04-19 MED ORDER — BENZTROPINE MESYLATE 1 MG PO TABS
1.0000 mg | ORAL_TABLET | Freq: Every day | ORAL | Status: DC
  Administered 2017-04-19 – 2017-04-24 (×6): 1 mg via ORAL
  Filled 2017-04-19 (×7): qty 1

## 2017-04-19 MED ORDER — GLUCOSE 40 % PO GEL
ORAL | Status: AC
Start: 2017-04-19 — End: 2017-04-19
  Filled 2017-04-19: qty 1

## 2017-04-19 MED ORDER — PANTOPRAZOLE SODIUM 40 MG PO TBEC
40.0000 mg | DELAYED_RELEASE_TABLET | Freq: Every day | ORAL | Status: DC
  Administered 2017-04-19 – 2017-04-24 (×6): 40 mg via ORAL
  Filled 2017-04-19 (×7): qty 1

## 2017-04-19 MED ORDER — RESOURCE THICKENUP CLEAR PO POWD
ORAL | Status: DC | PRN
  Filled 2017-04-19 (×2): qty 125

## 2017-04-19 NOTE — Progress Notes (Signed)
Pt refused IV. MD notified.  

## 2017-04-19 NOTE — Procedures (Signed)
Objective Swallowing Evaluation: Type of Study: FEES-Fiberoptic Endoscopic Evaluation of Swallow  Patient Details  Name: Abigail RutherfordDeborah A Charland MRN: [id number] Date of Birth: July 10, 1951  Today's Date: 04/19/2017 Time: SLP Start Time (ACUTE ONLY): 1036-SLP Stop Time (ACUTE ONLY): 1105 SLP Time Calculation (min) (ACUTE ONLY): 29 min  Past Medical History:  Past Medical History:  Diagnosis Date  . Allergy   . Diabetes mellitus without complication (HCC)   . Mixed stress and urge urinary incontinence   . Pneumonia   . Schizophrenia [facility](HCC)    Past Surgical History: History reviewed. No pertinent surgical history. HPI: 66 yo female with DKA, PEA cardiac arrest, acute renal failure, sepsis, VDRF 5/28-5/30. Hx of cognitive impairment, bipolar, CVA in 2015. Note MBS complete in 2015 during admission for similar dx, noted severe oropharyngeal dysphagia. FEES recommended after BSE.  No Data Recorded   Assessment / Plan / Recommendation  CHL IP CLINICAL IMPRESSIONS 04/19/2017  Clinical Impression Pt demonstrated a mild-moderate sensory based pharyngeal dysphagia marked by swallow initiation at the pyriform sinuses and penetration (before the swallow) to the level of the vocal cords not sensed or ejected. Cued volitional coughs ineffective. Mild vallecular and pyriform sinus residue. Visible increased dyspnea as study progressed. Nectar thick did not enter vestibule as SLP could assess during evaluation. Recommend Dys 3 texture and nectar thick liquids, NO straws, pills whole in applesauce and rest breaks if needed. ST will continue to work with pt.   SLP Visit Diagnosis Dysphagia, pharyngeal phase (R13.13)  Attention and concentration deficit following --  Frontal lobe and executive function deficit following --  Impact on safety and function Moderate aspiration risk      CHL IP TREATMENT RECOMMENDATION 04/19/2017  Treatment Recommendations Therapy as outlined in treatment plan below     Prognosis  04/19/2017  Prognosis for Safe Diet Advancement Good  Barriers to Reach Goals --  Barriers/Prognosis Comment --    CHL IP DIET RECOMMENDATION 04/19/2017  SLP Diet Recommendations Dysphagia 3 (Mech soft) solids;Nectar thick liquid  Liquid Administration via No straw;Cup  Medication Administration Whole meds with puree  Compensations Slow rate;Small sips/bites  Postural Changes Seated upright at 90 degrees;Remain semi-upright after after feeds/meals (Comment)      CHL IP OTHER RECOMMENDATIONS 04/19/2017  Recommended Consults --  Oral Care Recommendations Oral care BID  Other Recommendations --      CHL IP FOLLOW UP RECOMMENDATIONS 04/19/2017  Follow up Recommendations (No Data)      CHL IP FREQUENCY AND DURATION 04/19/2017  Speech Therapy Frequency (ACUTE ONLY) min 2x/week  Treatment Duration 2 weeks           CHL IP ORAL PHASE 04/19/2017  Oral Phase WFL  Oral - Pudding Teaspoon --  Oral - Pudding Cup --  Oral - Honey Teaspoon --  Oral - Honey Cup --  Oral - Nectar Teaspoon --  Oral - Nectar Cup --  Oral - Nectar Straw --  Oral - Thin Teaspoon --  Oral - Thin Cup --  Oral - Thin Straw --  Oral - Puree --  Oral - Mech Soft --  Oral - Regular --  Oral - Multi-Consistency --  Oral - Pill --  Oral Phase - Comment --    CHL IP PHARYNGEAL PHASE 04/19/2017  Pharyngeal Phase Impaired  Pharyngeal- Pudding Teaspoon --  Pharyngeal --  Pharyngeal- Pudding Cup --  Pharyngeal --  Pharyngeal- Honey Teaspoon --  Pharyngeal --  Pharyngeal- Honey Cup --  Pharyngeal --  Pharyngeal- Nectar  Teaspoon --  Pharyngeal --  Pharyngeal- Nectar Cup Pharyngeal residue - valleculae;Pharyngeal residue - pyriform;Delayed swallow initiation-vallecula  Pharyngeal --  Pharyngeal- Nectar Straw --  Pharyngeal --  Pharyngeal- Thin Teaspoon --  Pharyngeal --  Pharyngeal- Thin Cup Penetration/Aspiration during swallow;Delayed swallow initiation-pyriform sinuses;Pharyngeal residue - valleculae;Pharyngeal  residue - pyriform  Pharyngeal Material enters airway, CONTACTS cords and not ejected out  Pharyngeal- Thin Straw Penetration/Aspiration before swallow;Delayed swallow initiation-pyriform sinuses;Pharyngeal residue - pyriform;Pharyngeal residue - valleculae  Pharyngeal Material enters airway, CONTACTS cords and not ejected out  Pharyngeal- Puree --  Pharyngeal --  Pharyngeal- Mechanical Soft --  Pharyngeal --  Pharyngeal- Regular WFL  Pharyngeal --  Pharyngeal- Multi-consistency --  Pharyngeal --  Pharyngeal- Pill --  Pharyngeal --  Pharyngeal Comment --     CHL IP CERVICAL ESOPHAGEAL PHASE 04/19/2017  Cervical Esophageal Phase WFL  Pudding Teaspoon --  Pudding Cup --  Honey Teaspoon --  Honey Cup --  Nectar Teaspoon --  Nectar Cup --  Nectar Straw --  Thin Teaspoon --  Thin Cup --  Thin Straw --  Puree --  Mechanical Soft --  Regular --  Multi-consistency --  Pill --  Cervical Esophageal Comment --    No flowsheet data found.  Royce Macadamia 04/19/2017, 11:35 AM   Breck Coons Lonell Face.Ed [employer] [phone]

## 2017-04-19 NOTE — Progress Notes (Signed)
Pt refused IV . Notified MD Sommers.

## 2017-04-19 NOTE — Progress Notes (Signed)
Patient continues to refuse IV, explained patient has IV medication and cannot receive IV fluids without it. Patient states she will be leaving tomorrow, regardless of doctors orders, and does not want an IV.

## 2017-04-19 NOTE — Progress Notes (Signed)
Hypoglycemic Event  CBG: 67  Treatment: Glucagon IM 1 mg  Symptoms: None  Follow-up CBG: Time:8:35 CBG Result:101  Possible Reasons for Event: Inadequate meal intake  Comments/MD notified: Donnella Shamgrunz    Kreed Kauffman C Holdson

## 2017-04-19 NOTE — Evaluation (Signed)
Physical Therapy Evaluation Patient Details Name: Abigail RutherfordDeborah A Zamora MRN: [id number] DOB: October 28, 1951 Today's Date: 04/19/2017   History of Present Illness  pt is a 66 yo female admitted through the ED on 04/15/17 who presents after feeling bad for several weeks with increased urine urgency. Pt has a history of non-compliance with medications and was diagnosed with DKA. PMH significant for DM2 and schizophrenia.   Clinical Impression  Pt presents with the above diagnosis and below deficits for therapy evaluation. Prior to admission, pt was living alone in a single level home and receiving occasional assistance from sister to assist with medication management. Pt requires Mod A to perform transfers and bed mobs this session due to increased fatigue. Pt will will benefit from SNF at discharge in order to maximize her outcomes before returning home. Pt continues to benefit from acute rehab services in order to address the below deficits prior to discharge.     Follow Up Recommendations SNF;Supervision/Assistance - 24 hour    Equipment Recommendations  None recommended by PT    Recommendations for Other Services       Precautions / Restrictions Precautions Precautions: Fall Restrictions Weight Bearing Restrictions: No      Mobility  Bed Mobility Overal bed mobility: Needs Assistance Bed Mobility: Sit to Supine       Sit to supine: Min assist   General bed mobility comments: Min A to position in bed  Transfers Overall transfer level: Needs assistance Equipment used: Rolling walker (2 wheeled) Transfers: Sit to/from [employer] CorporationStand;Stand Pivot Transfers Sit to Stand: Mod assist Stand pivot transfers: Min assist       General transfer comment: Mod A to stand from recliner and Min A to maintain balance while performing hyginene. Min A to pivot transfer to bed.   Ambulation/Gait                Stairs            Wheelchair Mobility    Modified Rankin (Stroke Patients  Only)       Balance Overall balance assessment: Needs assistance Sitting-balance support: No upper extremity supported;Feet supported Sitting balance-Leahy Scale: Fair     Standing balance support: Bilateral upper extremity supported;During functional activity Standing balance-Leahy Scale: Poor Standing balance comment: reliant on RW for stability in standing                             Pertinent Vitals/Pain Pain Assessment: Faces Faces Pain Scale: Hurts little more Pain Location: bottom when performing hyginene after BM Pain Descriptors / Indicators: Grimacing Pain Intervention(s): Monitored during session;Repositioned    Home Living Family/patient expects to be discharged to:: Private residence Living Arrangements: Alone Available Help at Discharge: Family;Other (Comment) Type of Home: House Home Access: Level entry     Home Layout: One level Home Equipment: None      Prior Function Level of Independence: Independent               Hand Dominance   Dominant Hand: Right    Extremity/Trunk Assessment   Upper Extremity Assessment Upper Extremity Assessment: Generalized weakness    Lower Extremity Assessment Lower Extremity Assessment: Generalized weakness       Communication   Communication: No difficulties  Cognition Arousal/Alertness: Awake/alert Behavior During Therapy: WFL for tasks assessed/performed Overall Cognitive Status: Within Functional Limits for tasks assessed  General Comments      Exercises     Assessment/Plan    PT Assessment Patient needs continued PT services  PT Problem List Decreased strength;Decreased activity tolerance;Decreased balance;Decreased mobility;Decreased coordination;Decreased knowledge of use of DME       PT Treatment Interventions DME instruction;Gait training;Functional mobility training;Therapeutic activities;Therapeutic exercise;Balance  training;Patient/family education    PT Goals (Current goals can be found in the Care Plan section)  Acute Rehab PT Goals Patient Stated Goal: 1029 PT Goal Formulation: With patient Time For Goal Achievement: 05/03/17 Potential to Achieve Goals: Fair    Frequency Min 2X/week   Barriers to discharge Decreased caregiver support lives alone    Co-evaluation               AM-PAC PT "6 Clicks" Daily Activity  Outcome Measure Difficulty turning over in bed (including adjusting bedclothes, sheets and blankets)?: Total Difficulty moving from lying on back to sitting on the side of the bed? : Total Difficulty sitting down on and standing up from a chair with arms (e.g., wheelchair, bedside commode, etc,.)?: A Lot Help needed moving to and from a bed to chair (including a wheelchair)?: A Lot Help needed walking in hospital room?: A Lot Help needed climbing 3-5 steps with a railing? : A Lot 6 Click Score: 10    End of Session Equipment Utilized During Treatment: Gait belt Activity Tolerance: Patient limited by fatigue Patient left: in bed;with call bell/phone within reach;Other (comment) (with ST present performing FEES) Nurse Communication: Mobility status PT Visit Diagnosis: Unsteadiness on feet (R26.81);Difficulty in walking, not elsewhere classified (R26.2)    Time: 1610-9604 PT Time Calculation (min) (ACUTE ONLY): 19 min   Charges:   PT Evaluation $PT Eval Moderate Complexity: 1 Procedure     PT G Codes:        Colin Broach PT, DPT  [phone]   Roxy Manns 04/19/2017, 1:11 PM

## 2017-04-19 NOTE — Care Management Important Message (Signed)
Important Message  Patient Details  Name: Abigail RutherfordDeborah A Zamora MRN: [id number] Date of Birth: 04/29/1951   Medicare Important Message Given:  Yes    [name] [name] 04/19/2017, 11:20 AM

## 2017-04-19 NOTE — Progress Notes (Signed)
PROGRESS NOTE  [name]  ZOX:[id number] DOB: 1951/04/24 DOA: 04/15/2017 PCP: Lindley MagnusLong, Ashley, PA-C   Brief Narrative: [name] is a 66 y.o. female with a history of schizophrenia, poorly-controlled diabetes and DKA who presented to the ED after her family was unable to wake her from sleep. On arrival she was hyperglycemic, acidotic, and hypotensive, developing into PEA arrest with ROSC after about 5 minutes. She was intubated, put on pressors and insulin infusion, with eventual improvement. Sputum culture grew S. agalactiae consistent with streptococcal pneumonia, started antibiotics 5/29. She was extubated 5/30, off pressors since 5/31, and started back on basal/bolus insulin. Hospitalists assumed care 6/1.   Assessment & Plan: Principal Problem:   DKA (diabetic ketoacidoses) (HCC) Active Problems:   Cardiac arrest (HCC)   Septic shock (HCC)   Acute respiratory failure with hypoxia (HCC)   Anoxic brain injury (HCC)   Schizophrenia (HCC)   Thrombocytopenia (HCC)   Arterial hypotension   Acute respiratory acidosis   Acute renal failure (ARF) (HCC)   Acute on chronic systolic CHF (congestive heart failure) (HCC)  Acute hypoxemic respiratory failure: In setting of DKA, septic shock due to streptococcal pneumonia (S. agalactiae on sputum culture). Also suspect element of volume overload. - Continue oxygen prn - Treat pneumonia (strep. agalactiae) with 7 days abx (ceftriaxone > cefpodoxime 6/1) - Treat suspected volume overload with lasix 40mg  po daily. Monitor I/O, daily weights.  - Extubated 5/30.  Acute renal failure: Suspect ATN due to PEA arrest, septic shock.  - Monitor daily. Renal U/S shows 13.0 / 12.9cm kidneys with normal echogenicity and cortical thickness, no hydro ?cystitis - Check urine lytes, Pr/Cr - No indications for HD at this time. If worsening, consider nephrology consult. - Abx will cover cystitis  Acute HFrEF s/p PEA cardiac arrest: Echo 5/29:  LVEF: 35-40%, diffuse hypokinesis, g1dd, increased PA peak pressures. Received IVF's for septic shock initially, now requiring diuresis. Weaned off pressors 5/31.  - Give lasix 40mg  po daily  DKA and uncontrolled IDDM: Last HbA1c as 12.5.  - Titrating insulin, decrease lantus and monitor with SSI - Recheck HbA1c - Diabetes coordinator  Troponin elevation: Flat trend in setting of acute renal failure and following PEA arrest, denying chest pain today. ECG without ischemic changes.  - Monitor.  Metabolic acidosis - resolved, bicarbonate stopped Hyperkalemia - resolved Hypokalemia - resolved  Elevated LFTs: Suspect ischemic insult during PEA arrest, mild, downtrending.  - Monitor  Suspected anoxic brain injury and history of cognitive impairment, schizophrenia, bipolar disorder: Sister suspects she's been off medications for a while because she forgets to take them.  - Will restart outpatient medications (abilify, cogentin, wellbutrin, remeron)  DVT prophylaxis: Subcutaneous heparin Code Status: Full Family Communication: Sister at bedside Disposition Plan: Anticipate DC to SNF once work up and treatment of renal failure and septic shock completed.  Consultants:   PCCM primary until 6/1  Procedures:  ETT 5/28 >> 5/30 Rt IJ CVL 5/28 >> 5/31  Antimicrobials:  Ceftriaxone  Subjective: Pt without complaints, unable to correctly answer orientation questions, but denies pain. Denies trouble breathing, but sister reports she's had cough. Not eating much.  Objective: Vitals:   04/18/17 1905 04/18/17 2014 04/19/17 0520 04/19/17 1000  BP: 116/68 109/66 109/64 126/69  Pulse: 82 (!) 103 96 (!) 106  Resp: 16 18 15 16   Temp: 98.7 F (37.1 C) 99.1 F (37.3 C) 98.8 F (37.1 C) 98.2 F (36.8 C)  TempSrc: Oral   Oral  SpO2: 93% 100%  96% 96%  Weight: 77.5 kg (170 lb 12.8 oz)  77.4 kg (170 lb 10.2 oz)   Height: 5\' 5"  (1.651 m)       Intake/Output Summary (Last 24 hours) at  04/19/17 1712 Last data filed at 04/19/17 1315  Gross per 24 hour  Intake           594.67 ml  Output             1000 ml  Net          -405.33 ml   Filed Weights   04/18/17 0300 04/18/17 1905 04/19/17 0520  Weight: 75.8 kg (167 lb 1.7 oz) 77.5 kg (170 lb 12.8 oz) 77.4 kg (170 lb 10.2 oz)    Examination: General exam: Ill-appearing female in no acute distress Respiratory system: Non-labored breathing with supplemental oxygen. Bibasilar crackles. Cardiovascular system: Regular rate and rhythm. No murmur, rub, or gallop. No JVD, and 2+ pedal edema. Gastrointestinal system: Abdomen soft, non-tender, non-distended, with normoactive bowel sounds. No organomegaly or masses felt. Central nervous system: Alert. Unaware of year, thought she was in assisted living, not sure why she's in the hospital. No focal neurological deficits. Extremities: Warm, no deformities Skin: No rashes, lesions no ulcers Psychiatry: Judgement and insight appear impaired. Cognitive impairment, short term recall impaired. Mood & affect appropriate.   Data Reviewed: I have personally reviewed following labs and imaging studies  CBC:  Recent Labs Lab 04/15/17 1137 04/15/17 1156 04/16/17 0233 04/17/17 0500 04/18/17 0425 04/19/17 0218  WBC 14.4*  --  6.8 10.9* 6.6 6.3  NEUTROABS 11.9*  --  5.5 9.7*  --   --   HGB 13.7 15.0 11.7* 11.2* 9.5* 9.4*  HCT 44.7 44.0 35.0* 32.5* 28.5* 27.6*  MCV 87.8  --  79.2 77.2* 78.5 79.3  PLT 323  --  153 162 88* 93*   Basic Metabolic Panel:  Recent Labs Lab 04/15/17 1459 04/16/17 0233 04/16/17 1200 04/16/17 2141 04/17/17 0500 04/18/17 0340 04/19/17 0218  NA 143 137 138 135 139 140 138  K 2.8* 3.2* 2.7* 3.6 4.4 3.8 4.1  CL 123* 111 108 108 110 106 105  CO2 <7* 14* 18* 18* 20* 22 22  GLUCOSE 418* 364* 215* 276* 166* 145* 92  BUN 19 32* 33* 39* 43* 50* 58*  CREATININE 1.40* 2.11* 2.29* 2.55* 2.81* 3.73* 4.50*  CALCIUM 5.3* 6.8* 7.0* 7.1* 7.3* 7.5* 7.6*  MG 1.3* 1.3*   --  2.6*  --  2.3  --   PHOS 4.2 <1.0*  --   --  1.1* 1.8*  --    GFR: Estimated Creatinine Clearance: 13 mL/min (A) (by C-G formula based on SCr of 4.5 mg/dL (H)). Liver Function Tests:  Recent Labs Lab 04/15/17 1137 04/17/17 0500 04/18/17 0340  AST 16 156* 88*  ALT 15 230* 157*  ALKPHOS 141* 223* 189*  BILITOT 1.6* 0.7 0.9  PROT 6.9 4.6* 4.7*  ALBUMIN 3.0* 1.6* 1.7*   No results for input(s): LIPASE, AMYLASE in the last 168 hours. No results for input(s): AMMONIA in the last 168 hours. Coagulation Profile: No results for input(s): INR, PROTIME in the last 168 hours. Cardiac Enzymes:  Recent Labs Lab 04/16/17 0233 04/18/17 2040 04/19/17 0218 04/19/17 1116  TROPONINI 0.18* 0.18* 0.20* 0.17*   BNP (last 3 results) No results for input(s): PROBNP in the last 8760 hours. HbA1C: No results for input(s): HGBA1C in the last 72 hours. CBG:  Recent Labs Lab 04/18/17 2359 04/19/17 0518 04/19/17  0740 04/19/17 0835 04/19/17 1142  GLUCAP 98 86 67 101* 126*   Lipid Profile: No results for input(s): CHOL, HDL, LDLCALC, TRIG, CHOLHDL, LDLDIRECT in the last 72 hours. Thyroid Function Tests: No results for input(s): TSH, T4TOTAL, FREET4, T3FREE, THYROIDAB in the last 72 hours. Anemia Panel: No results for input(s): VITAMINB12, FOLATE, FERRITIN, TIBC, IRON, RETICCTPCT in the last 72 hours. Urine analysis:    Component Value Date/Time   COLORURINE AMBER (A) 04/15/2017 1137   APPEARANCEUR CLOUDY (A) 04/15/2017 1137   LABSPEC 1.016 04/15/2017 1137   PHURINE 5.0 04/15/2017 1137   GLUCOSEU >=500 (A) 04/15/2017 1137   HGBUR SMALL (A) 04/15/2017 1137   BILIRUBINUR NEGATIVE 04/15/2017 1137   BILIRUBINUR neg 05/22/2014 0935   KETONESUR 80 (A) 04/15/2017 1137   PROTEINUR 100 (A) 04/15/2017 1137   UROBILINOGEN 0.2 10/14/2014 0545   NITRITE NEGATIVE 04/15/2017 1137   LEUKOCYTESUR NEGATIVE 04/15/2017 1137   Recent Results (from the past 240 hour(s))  Blood Culture (routine x  2)     Status: None (Preliminary result)   Collection Time: 04/15/17 12:24 PM  Result Value Ref Range Status   Specimen Description BLOOD LEFT HAND  Final   Special Requests IN PEDIATRIC BOTTLE Blood Culture adequate volume  Final   Culture NO GROWTH 4 DAYS  Final   Report Status PENDING  Incomplete  Urine culture     Status: None   Collection Time: 04/15/17  2:53 PM  Result Value Ref Range Status   Specimen Description URINE, CATHETERIZED  Final   Special Requests NONE  Final   Culture NO GROWTH  Final   Report Status 04/16/2017 FINAL  Final  MRSA PCR Screening     Status: None   Collection Time: 04/15/17  2:59 PM  Result Value Ref Range Status   MRSA by PCR NEGATIVE NEGATIVE Final    Comment:        The GeneXpert MRSA Assay (FDA approved for NASAL specimens only), is one component of a comprehensive MRSA colonization surveillance program. It is not intended to diagnose MRSA infection nor to guide or monitor treatment for MRSA infections.   Culture, blood (Routine X 2) w Reflex to ID Panel     Status: None (Preliminary result)   Collection Time: 04/15/17  7:15 PM  Result Value Ref Range Status   Specimen Description BLOOD LEFT HAND  Final   Special Requests IN PEDIATRIC BOTTLE Blood Culture adequate volume  Final   Culture NO GROWTH 4 DAYS  Final   Report Status PENDING  Incomplete  Culture, respiratory (NON-Expectorated)     Status: None   Collection Time: 04/16/17 10:15 AM  Result Value Ref Range Status   Specimen Description TRACHEAL ASPIRATE  Final   Special Requests NONE  Final   Gram Stain   Final    DEGENERATED CELLULAR MATERIAL PRESENT ABUNDANT GRAM POSITIVE COCCI IN PAIRS    Culture   Final    ABUNDANT STREPTOCOCCUS AGALACTIAE TESTING AGAINST S. AGALACTIAE NOT ROUTINELY PERFORMED DUE TO PREDICTABILITY OF AMP/PEN/VAN SUSCEPTIBILITY.    Report Status 04/18/2017 FINAL  Final      Radiology Studies: US Renal  Result Date: 04/18/2017 CLINICAL DATA:  Acute  renal failure EXAM: RENAL / URINARY TRACT ULTRASOUND COMPLETE COMPARISON:  October 04, 2014 FINDINGS: Right Kidney: Length: 13.0 cm. Echogenicity and renal cortical thickness are within normal limits. No mass, perinephric fluid, or hydronephrosis visualized. No sonographically demonstrable calculus or ureterectasis. Left Kidney: Length: 13.9 cm. Echogenicity and renal cortical thickness  are within normal limits. No mass, perinephric fluid, or hydronephrosis visualized. No sonographically demonstrable calculus or ureterectasis. Bladder: There is apparent urinary bladder wall thickening. No demonstrable mass appreciable by ultrasound. There is mild ascites. IMPRESSION: Question a degree of cystitis. No renal lesions evident by ultrasound. Mild ascites. Electronically Signed   By: Bretta Bang III M.D.   On: 04/18/2017 16:37   Dg Chest Port 1 View  Result Date: 04/19/2017 CLINICAL DATA:  Hypoxia EXAM: PORTABLE CHEST 1 VIEW COMPARISON:  Apr 18, 2017 FINDINGS: There is persistent interstitial edema bilaterally. There are small pleural effusions bilaterally, slightly larger on the right than on the left. There is no frank airspace consolidation. Heart is mildly enlarged. There is mild pulmonary venous hypertension. There is aortic atherosclerosis. No evident adenopathy. No bone lesions. IMPRESSION: Persistent changes of congestive heart failure. Degree of edema is similar to 1 day prior. Small pleural effusions are noted. Stable cardiac silhouette. There is aortic atherosclerosis. Electronically Signed   By: Bretta Bang III M.D.   On: 04/19/2017 07:59   Dg Chest Port 1 View  Result Date: 04/18/2017 CLINICAL DATA:  66 year old female with chest pain. EXAM: PORTABLE CHEST 1 VIEW COMPARISON:  0443 hours today and earlier. FINDINGS: Portable AP semi upright view at 2029 hours. Mildly lower lung volumes. Increasing perihilar interstitial opacity and indistinctness of pulmonary vasculature. Stable cardiac  size and mediastinal contours. Visualized tracheal air column is within normal limits. No pneumothorax. No definite pleural effusion or consolidation. IMPRESSION: Increasing perihilar and indistinct interstitial opacity most suggestive of acute pulmonary edema. Electronically Signed   By: Odessa Fleming M.D.   On: 04/18/2017 20:43   Dg Chest Port 1 View  Result Date: 04/18/2017 CLINICAL DATA:  Hypoxia. EXAM: PORTABLE CHEST 1 VIEW COMPARISON:  04/17/2017 . FINDINGS: Interim extubation and removal of NG tube. Right IJ line stable position. Cardiomegaly with diffuse bilateral from interstitial prominence noted on today's exam. Small left pleural effusion . Findings consistent with mild CHF. IMPRESSION: 1. Interim extubation removal of NG tube. Right IJ line stable position. 2. Cardiomegaly with diffuse bilateral from interstitial prominence noted on today's exam. Small left pleural effusion. Findings consistent CHF. Electronically Signed   By: Maisie Fus  Register   On: 04/18/2017 06:36    Scheduled Meds: . ARIPiprazole  15 mg Oral Daily  . benztropine  1 mg Oral QHS  . buPROPion  75 mg Oral BID  . cefpodoxime  200 mg Oral Q24H  . chlorhexidine  15 mL Mouth Rinse BID  . dextrose      . furosemide  40 mg Oral Daily  . heparin subcutaneous  5,000 Units Subcutaneous Q8H  . insulin aspart  1-3 Units Subcutaneous Q4H  . insulin glargine  10 Units Subcutaneous Q24H  . mouth rinse  15 mL Mouth Rinse q12n4p  . mirtazapine  15 mg Oral QHS  . multivitamin  15 mL Per Tube Daily  . pantoprazole  40 mg Oral QHS   Continuous Infusions:    LOS: 4 days   Time spent: 25 minutes.  Hazeline Junker, MD [facility] Pager [phone]  If 7PM-7AM, please contact night-coverage [email or web address] Password [email or web address] 04/19/2017, 5:12 PM

## 2017-04-19 NOTE — Progress Notes (Signed)
PCCM INTERVAL PROGRESS NOTE  Patient refusing IV as her current one had become infiltrated. I discussed with her and she is willing to have another placed  Also ok for RN to perform bedside swallow eval and if passes, ok to drink water/juice.    Joneen RoachPaul Zaylen Susman, AGACNP-BC [facility] Pager [phone] or [phone]  04/19/2017 4:13 AM

## 2017-04-20 LAB — URINALYSIS, ROUTINE W REFLEX MICROSCOPIC
BILIRUBIN URINE: NEGATIVE
Glucose, UA: NEGATIVE mg/dL
Ketones, ur: NEGATIVE mg/dL
Leukocytes, UA: NEGATIVE
Nitrite: NEGATIVE
PROTEIN: NEGATIVE mg/dL
SPECIFIC GRAVITY, URINE: 1.005 (ref 1.005–1.030)
Squamous Epithelial / LPF: NONE SEEN
pH: 5 (ref 5.0–8.0)

## 2017-04-20 LAB — RENAL FUNCTION PANEL
Albumin: 1.5 g/dL — ABNORMAL LOW (ref 3.5–5.0)
Anion gap: 16 — ABNORMAL HIGH (ref 5–15)
BUN: 70 mg/dL — ABNORMAL HIGH (ref 6–20)
CHLORIDE: 105 mmol/L (ref 101–111)
CO2: 19 mmol/L — AB (ref 22–32)
Calcium: 8.1 mg/dL — ABNORMAL LOW (ref 8.9–10.3)
Creatinine, Ser: 5.48 mg/dL — ABNORMAL HIGH (ref 0.44–1.00)
GFR calc non Af Amer: 7 mL/min — ABNORMAL LOW (ref >60)
GFR, EST AFRICAN AMERICAN: 9 mL/min — AB (ref >60)
Glucose, Bld: 68 mg/dL (ref 65–99)
Phosphorus: 2.4 mg/dL — ABNORMAL LOW (ref 2.5–4.6)
Potassium: 3.6 mmol/L (ref 3.5–5.1)
Sodium: 140 mmol/L (ref 135–145)

## 2017-04-20 LAB — CULTURE, BLOOD (ROUTINE X 2)
Culture: NO GROWTH
Culture: NO GROWTH
SPECIAL REQUESTS: ADEQUATE
Special Requests: ADEQUATE

## 2017-04-20 LAB — GLUCOSE, CAPILLARY
Glucose-Capillary: 94 mg/dL (ref 65–99)
Glucose-Capillary: 74 mg/dL (ref 65–99)
Glucose-Capillary: 115 mg/dL — ABNORMAL HIGH (ref 65–99)

## 2017-04-20 LAB — CREATININE, URINE, RANDOM: CREATININE, URINE: 20.09 mg/dL

## 2017-04-20 LAB — SODIUM, URINE, RANDOM: SODIUM UR: 80 mmol/L

## 2017-04-20 MED ORDER — FUROSEMIDE 10 MG/ML IJ SOLN
80.0000 mg | Freq: Once | INTRAMUSCULAR | Status: AC
  Administered 2017-04-21: 80 mg via INTRAVENOUS
  Filled 2017-04-20: qty 8

## 2017-04-20 MED ORDER — FLUCONAZOLE 100 MG PO TABS
150.0000 mg | ORAL_TABLET | ORAL | Status: AC
  Administered 2017-04-20 – 2017-04-23 (×2): 150 mg via ORAL
  Filled 2017-04-20 (×2): qty 2

## 2017-04-20 MED ORDER — ALBUMIN HUMAN 25 % IV SOLN
12.5000 g | Freq: Once | INTRAVENOUS | Status: AC
  Administered 2017-04-20: 12.5 g via INTRAVENOUS
  Filled 2017-04-20: qty 50

## 2017-04-20 MED ORDER — FUROSEMIDE 10 MG/ML IJ SOLN
80.0000 mg | Freq: Once | INTRAMUSCULAR | Status: AC
  Administered 2017-04-20: 80 mg via INTRAVENOUS
  Filled 2017-04-20: qty 8

## 2017-04-20 MED ORDER — ALBUMIN HUMAN 25 % IV SOLN
12.5000 g | Freq: Once | INTRAVENOUS | Status: AC
  Administered 2017-04-21: 12.5 g via INTRAVENOUS
  Filled 2017-04-20: qty 50

## 2017-04-20 MED ORDER — ALBUMIN HUMAN 25 % IV SOLN
25.0000 g | Freq: Once | INTRAVENOUS | Status: AC
  Administered 2017-04-20: 25 g via INTRAVENOUS
  Filled 2017-04-20: qty 100

## 2017-04-20 MED ORDER — INSULIN ASPART 100 UNIT/ML ~~LOC~~ SOLN
0.0000 [IU] | Freq: Three times a day (TID) | SUBCUTANEOUS | Status: DC
  Administered 2017-04-20: 2 [IU] via SUBCUTANEOUS
  Administered 2017-04-21: 7 [IU] via SUBCUTANEOUS
  Administered 2017-04-21 (×2): 3 [IU] via SUBCUTANEOUS
  Administered 2017-04-22: 5 [IU] via SUBCUTANEOUS

## 2017-04-20 NOTE — Progress Notes (Signed)
PROGRESS NOTE  [name]  MWN:[id number] DOB: Mar 31, 1951 DOA: 04/15/2017 PCP: Lindley Magnus, PA-C   Brief Narrative: [name] is a 66 y.o. female with a history of schizophrenia, poorly-controlled diabetes and DKA who presented to the ED after her family was unable to wake her from sleep. On arrival she was hyperglycemic, acidotic, and hypotensive, developing into PEA arrest with ROSC after about 5 minutes. She was intubated, put on pressors and insulin infusion, with eventual improvement. Sputum culture grew S. agalactiae consistent with streptococcal pneumonia, started antibiotics 5/29. She was extubated 5/30, off pressors since 5/31, and started back on basal/bolus insulin. Hospitalists assumed care 6/1. Renal failure has persisted/worsened and nephrology is consulted.   Assessment & Plan: Principal Problem:   DKA (diabetic ketoacidoses) (HCC) Active Problems:   Cardiac arrest (HCC)   Septic shock (HCC)   Acute respiratory failure with hypoxia (HCC)   Anoxic brain injury (HCC)   Schizophrenia (HCC)   Thrombocytopenia (HCC)   Arterial hypotension   Acute respiratory acidosis   Acute renal failure (ARF) (HCC)   Acute on chronic systolic CHF (congestive heart failure) (HCC)  Acute hypoxemic respiratory failure: In setting of DKA, septic shock due to streptococcal pneumonia (S. agalactiae on sputum culture). Also suspect element of volume overload. Extubated 5/30. - Continue oxygen prn - Treat pneumonia (strep. agalactiae) with 7 days abx (ceftriaxone > cefpodoxime 6/1) - Treat volume overload with albumin/lasix. Monitor I/O, daily weights.   Acute renal failure: Suspect ATN due to PEA arrest, septic shock. Renal U/S shows 13.0 / 13.9cm kidneys with normal echogenicity and cortical thickness, no hydro ?cystitis. FENa elevated at 15%. - Monitor daily.  - Appreciate nephrology assistance. - No indications for HD at this time. - Abx will cover cystitis  Acute HFrEF  s/p PEA cardiac arrest: Echo 5/29: LVEF: 35-40%, diffuse hypokinesis, g1dd, increased PA peak pressures. Received IVF's for septic shock initially, now requiring diuresis. Weaned off pressors 5/31.  - Giving albumin + lasix x3 today and tomorrow.   DKA and uncontrolled IDDM: Last HbA1c was 12.8%.  - Titrating insulin, decrease lantus and monitor with SSI TID - Recheck HbA1c  - Diabetes coordinator consulted  Troponin elevation: Flat trend in setting of acute renal failure and following PEA arrest, denying chest pain today. ECG without ischemic changes.  - Monitor.  Metabolic acidosis - resolved, bicarbonate stopped Hyperkalemia - resolved Hypokalemia - resolved  Elevated LFTs: Suspect ischemic insult during PEA arrest, mild, downtrending.  - Monitor  Suspected anoxic brain injury and history of cognitive impairment, schizophrenia, bipolar disorder: Sister suspects she's been off medications for a while because she forgets to take them.  - Restarted outpatient medications (abilify, cogentin, wellbutrin, remeron) 6/1  DVT prophylaxis: Subcutaneous heparin Code Status: Full Family Communication: None at bedside Disposition Plan: Anticipate DC to SNF once work up and treatment of renal failure and septic shock completed.  Consultants:   PCCM primary until 6/1  Procedures:  ETT 5/28 >> 5/30 Rt IJ CVL 5/28 >> 5/31  Antimicrobials:  Ceftriaxone  Subjective: Pt without complaints this morning, no one at bedside. Not urinating very much, but having incontinence without dysuria.    Objective: Vitals:   04/19/17 2003 04/19/17 2004 04/20/17 0400 04/20/17 0926  BP: (!) 136/98  120/72 125/72  Pulse: (!) 110  96 97  Resp: 16  13 16   Temp: 98.1 F (36.7 C)  98 F (36.7 C) 98.6 F (37 C)  TempSrc:    Oral  SpO2: 93%  100% 92%  Weight:  76.7 kg (169 lb)    Height:        Intake/Output Summary (Last 24 hours) at 04/20/17 1543 Last data filed at 04/20/17 1354  Gross per 24  hour  Intake              480 ml  Output              500 ml  Net              -20 ml   Filed Weights   04/18/17 1905 04/19/17 0520 04/19/17 2004  Weight: 77.5 kg (170 lb 12.8 oz) 77.4 kg (170 lb 10.2 oz) 76.7 kg (169 lb)    Examination: General exam: Ill-appearing female in no acute distress Respiratory system: Non-labored breathing with supplemental oxygen. Coarse, with bibasilar crackles. Cardiovascular system: Regular rate and rhythm. +systolic murmur. 3+ UE edema, 2+ pedal edema. Gastrointestinal system: Abdomen soft, non-tender, non-distended, with normoactive bowel sounds. No organomegaly or masses felt. Central nervous system: Alert, not oriented. No focal neurological deficits. Extremities: Warm, no deformities  Skin: No rashes, lesions no ulcers Psychiatry: Judgement and insight appear impaired. Cognitive impairment, short term recall impaired. Mood & affect appropriate.   Data Reviewed: I have personally reviewed following labs and imaging studies  CBC:  Recent Labs Lab 04/15/17 1137 04/15/17 1156 04/16/17 0233 04/17/17 0500 04/18/17 0425 04/19/17 0218  WBC 14.4*  --  6.8 10.9* 6.6 6.3  NEUTROABS 11.9*  --  5.5 9.7*  --   --   HGB 13.7 15.0 11.7* 11.2* 9.5* 9.4*  HCT 44.7 44.0 35.0* 32.5* 28.5* 27.6*  MCV 87.8  --  79.2 77.2* 78.5 79.3  PLT 323  --  153 162 88* 93*   Basic Metabolic Panel:  Recent Labs Lab 04/15/17 1459 04/16/17 0233  04/16/17 2141 04/17/17 0500 04/18/17 0340 04/19/17 0218 04/20/17 0653  NA 143 137  < > 135 139 140 138 140  K 2.8* 3.2*  < > 3.6 4.4 3.8 4.1 3.6  CL 123* 111  < > 108 110 106 105 105  CO2 <7* 14*  < > 18* 20* 22 22 19*  GLUCOSE 418* 364*  < > 276* 166* 145* 92 68  BUN 19 32*  < > 39* 43* 50* 58* 70*  CREATININE 1.40* 2.11*  < > 2.55* 2.81* 3.73* 4.50* 5.48*  CALCIUM 5.3* 6.8*  < > 7.1* 7.3* 7.5* 7.6* 8.1*  MG 1.3* 1.3*  --  2.6*  --  2.3  --   --   PHOS 4.2 <1.0*  --   --  1.1* 1.8*  --  2.4*  < > = values in this  interval not displayed. GFR: Estimated Creatinine Clearance: 10.6 mL/min (A) (by C-G formula based on SCr of 5.48 mg/dL (H)). Liver Function Tests:  Recent Labs Lab 04/15/17 1137 04/17/17 0500 04/18/17 0340 04/20/17 0653  AST 16 156* 88*  --   ALT 15 230* 157*  --   ALKPHOS 141* 223* 189*  --   BILITOT 1.6* 0.7 0.9  --   PROT 6.9 4.6* 4.7*  --   ALBUMIN 3.0* 1.6* 1.7* 1.5*   No results for input(s): LIPASE, AMYLASE in the last 168 hours. No results for input(s): AMMONIA in the last 168 hours. Coagulation Profile: No results for input(s): INR, PROTIME in the last 168 hours. Cardiac Enzymes:  Recent Labs Lab 04/16/17 0233 04/18/17 2040 04/19/17 0218 04/19/17 1116  TROPONINI 0.18* 0.18* 0.20* 0.17*  BNP (last 3 results) No results for input(s): PROBNP in the last 8760 hours. HbA1C: No results for input(s): HGBA1C in the last 72 hours. CBG:  Recent Labs Lab 04/19/17 2003 04/19/17 2336 04/20/17 0359 04/20/17 0748 04/20/17 1201  GLUCAP 197* 156* 94 74 115*   Lipid Profile: No results for input(s): CHOL, HDL, LDLCALC, TRIG, CHOLHDL, LDLDIRECT in the last 72 hours. Thyroid Function Tests: No results for input(s): TSH, T4TOTAL, FREET4, T3FREE, THYROIDAB in the last 72 hours. Anemia Panel: No results for input(s): VITAMINB12, FOLATE, FERRITIN, TIBC, IRON, RETICCTPCT in the last 72 hours. Urine analysis:    Component Value Date/Time   COLORURINE STRAW (A) 04/20/2017 1358   APPEARANCEUR CLEAR 04/20/2017 1358   LABSPEC 1.005 04/20/2017 1358   PHURINE 5.0 04/20/2017 1358   GLUCOSEU NEGATIVE 04/20/2017 1358   HGBUR SMALL (A) 04/20/2017 1358   BILIRUBINUR NEGATIVE 04/20/2017 1358   BILIRUBINUR neg 05/22/2014 0935   KETONESUR NEGATIVE 04/20/2017 1358   PROTEINUR NEGATIVE 04/20/2017 1358   UROBILINOGEN 0.2 10/14/2014 0545   NITRITE NEGATIVE 04/20/2017 1358   LEUKOCYTESUR NEGATIVE 04/20/2017 1358   Recent Results (from the past 240 hour(s))  Blood Culture (routine  x 2)     Status: None (Preliminary result)   Collection Time: 04/15/17 12:24 PM  Result Value Ref Range Status   Specimen Description BLOOD LEFT HAND  Final   Special Requests IN PEDIATRIC BOTTLE Blood Culture adequate volume  Final   Culture NO GROWTH 4 DAYS  Final   Report Status PENDING  Incomplete  Urine culture     Status: None   Collection Time: 04/15/17  2:53 PM  Result Value Ref Range Status   Specimen Description URINE, CATHETERIZED  Final   Special Requests NONE  Final   Culture NO GROWTH  Final   Report Status 04/16/2017 FINAL  Final  MRSA PCR Screening     Status: None   Collection Time: 04/15/17  2:59 PM  Result Value Ref Range Status   MRSA by PCR NEGATIVE NEGATIVE Final    Comment:        The GeneXpert MRSA Assay (FDA approved for NASAL specimens only), is one component of a comprehensive MRSA colonization surveillance program. It is not intended to diagnose MRSA infection nor to guide or monitor treatment for MRSA infections.   Culture, blood (Routine X 2) w Reflex to ID Panel     Status: None (Preliminary result)   Collection Time: 04/15/17  7:15 PM  Result Value Ref Range Status   Specimen Description BLOOD LEFT HAND  Final   Special Requests IN PEDIATRIC BOTTLE Blood Culture adequate volume  Final   Culture NO GROWTH 4 DAYS  Final   Report Status PENDING  Incomplete  Culture, respiratory (NON-Expectorated)     Status: None   Collection Time: 04/16/17 10:15 AM  Result Value Ref Range Status   Specimen Description TRACHEAL ASPIRATE  Final   Special Requests NONE  Final   Gram Stain   Final    DEGENERATED CELLULAR MATERIAL PRESENT ABUNDANT GRAM POSITIVE COCCI IN PAIRS    Culture   Final    ABUNDANT STREPTOCOCCUS AGALACTIAE TESTING AGAINST S. AGALACTIAE NOT ROUTINELY PERFORMED DUE TO PREDICTABILITY OF AMP/PEN/VAN SUSCEPTIBILITY.    Report Status 04/18/2017 FINAL  Final      Radiology Studies: Dg Chest Port 1 View  Result Date: 04/19/2017 CLINICAL  DATA:  Hypoxia EXAM: PORTABLE CHEST 1 VIEW COMPARISON:  Apr 18, 2017 FINDINGS: There is persistent interstitial edema bilaterally. There  are small pleural effusions bilaterally, slightly larger on the right than on the left. There is no frank airspace consolidation. Heart is mildly enlarged. There is mild pulmonary venous hypertension. There is aortic atherosclerosis. No evident adenopathy. No bone lesions. IMPRESSION: Persistent changes of congestive heart failure. Degree of edema is similar to 1 day prior. Small pleural effusions are noted. Stable cardiac silhouette. There is aortic atherosclerosis. Electronically Signed   By: Bretta BangWilliam  Woodruff III M.D.   On: 04/19/2017 07:59   Dg Chest Port 1 View  Result Date: 04/18/2017 CLINICAL DATA:  66 year old female with chest pain. EXAM: PORTABLE CHEST 1 VIEW COMPARISON:  0443 hours today and earlier. FINDINGS: Portable AP semi upright view at 2029 hours. Mildly lower lung volumes. Increasing perihilar interstitial opacity and indistinctness of pulmonary vasculature. Stable cardiac size and mediastinal contours. Visualized tracheal air column is within normal limits. No pneumothorax. No definite pleural effusion or consolidation. IMPRESSION: Increasing perihilar and indistinct interstitial opacity most suggestive of acute pulmonary edema. Electronically Signed   By: Odessa FlemingH  Hall M.D.   On: 04/18/2017 20:43    Scheduled Meds: . ARIPiprazole  15 mg Oral Daily  . benztropine  1 mg Oral QHS  . buPROPion  75 mg Oral BID  . cefpodoxime  200 mg Oral Q24H  . chlorhexidine  15 mL Mouth Rinse BID  . fluconazole  150 mg Oral Q72H  . furosemide  80 mg Intravenous Once  . [START ON 04/21/2017] furosemide  80 mg Intravenous Once  . heparin subcutaneous  5,000 Units Subcutaneous Q8H  . insulin aspart  1-3 Units Subcutaneous Q4H  . insulin glargine  10 Units Subcutaneous Q24H  . mouth rinse  15 mL Mouth Rinse q12n4p  . mirtazapine  15 mg Oral QHS  . multivitamin  15 mL Per  Tube Daily  . pantoprazole  40 mg Oral QHS   Continuous Infusions: . albumin human    . [START ON 04/21/2017] albumin human       LOS: 5 days   Time spent: 25 minutes.  Hazeline Junkeryan Ariana Juul, MD [facility] Pager [phone]  If 7PM-7AM, please contact night-coverage [email or web address] Password [email or web address] 04/20/2017, 3:43 PM

## 2017-04-20 NOTE — Clinical Social Work Note (Signed)
Clinical Social Work Assessment  Patient Details  Name: Narda RutherfordDeborah A Waugh MRN: [id number] Date of Birth: 1951/02/08  Date of referral:  04/20/17               Reason for consult:  Facility Placement                Permission sought to share information with:  Facility Medical sales representativeContact Representative, Family Supports Permission granted to share information::  No  Name::     Insurance underwriterCarol  Agency::  SNFs  Relationship::  Sister  Contact Information:  [phone]  Housing/Transportation Living arrangements for the past 2 months:  Single Family Home Source of Information:  Other (Comment Required) (Sister) Patient Interpreter Needed:  None Criminal Activity/Legal Involvement Pertinent to Current Situation/Hospitalization:  No - Comment as needed Significant Relationships:  Siblings, Significant Other Lives with:  Significant Other Do you feel safe going back to the place where you live?  No Need for family participation in patient care:  Yes (Comment)  Care giving concerns:  CSW received consult for possible SNF placement at time of discharge. Patient is disoriented. CSW spoke with patient's sister regarding PT recommendation of SNF placement at time of discharge. Patient's sister reported that patient lives with her significant other who is currently unable to care for patient at their home given patient's current physical needs and fall risk. Patient's sister expressed understanding of PT recommendation and is agreeable to SNF placement at time of discharge. CSW to continue to follow and assist with discharge planning needs.   Social Worker assessment / plan:  CSW spoke with patient's sister [facility].  Employment status:  Retired Health and safety inspectornsurance information:  Medicare PT Recommendations:  Skilled Nursing Facility Information / Referral to community resources:  Skilled Nursing Facility  Patient/Family's Response to care:  Patient's sister recognizes  need for rehab before returning home and [place]. [facility].  Patient/Family's Understanding of and Emotional Response to Diagnosis, Current Treatment, and Prognosis:  Patient/family is realistic regarding therapy needs and expressed being hopeful for SNF placement. Patient's sister expressed understanding of CSW role and discharge process as well medical condition. No questions/concerns about plan or treatment.    Emotional Assessment Appearance:  Appears stated age Attitude/Demeanor/Rapport:  Unable to Assess Affect (typically observed):  Unable to Assess Orientation:  Oriented to Self, Oriented to Place, Oriented to  Time Alcohol / Substance use:  Not Applicable Psych involvement (Current and /or in the community):  No (Comment)  Discharge Needs  Concerns to be addressed:  Care Coordination Readmission within the last 30 days:  No Current discharge risk:  None Barriers to Discharge:  Continued Medical Work up   [employer] Fidel Caggiano, LCSWA 04/20/2017, 2:27 PM

## 2017-04-20 NOTE — Consult Note (Signed)
Reason for Consult: Acute kidney injury on chronic kidney disease stage III Referring Physician: Hazeline Junker M.D. [facility])  HPI:  66 year old woman with past medical history significant for schizophrenia, poorly controlled diabetes mellitus, hypertension and what appears to be baseline chronic kidney disease stage III (creatinine suspected to be about 1.8 at baseline based on records from November 2017). She was admitted 5 days ago with altered LOC and developed PEA arrest with ROSC after resuscitation in about 5 minutes. She was found to be in diabetic ketoacidosis and there was some preceding history of possible urinary tract infection which was suspected to be the source of her sepsis/septic shock.  Concern is raised with her rising creatinine and what appears to be marginal urine output. She is not on any RAS blocking agents, was started on furosemide yesterday and has not received any NSAIDs or iodinated intravenous contrast. She reports some shortness of breath with intermittent cough along with swelling in her arms and legs. She has had some problems with urinary incontinence and is having difficulty voiding urine at this time. She denies any dysuria or hematuria. Denies any recent skin rash.   09/27/2016  04/15/2017  04/16/2017  04/17/2017  04/18/2017  04/19/2017  04/20/2017   BUN 49 (H) 23 (H) 32 (H) 43 (H) 50 (H) 58 (H) 70 (H)  Creatinine 1.72 (H) 2.34 (H) 2.11 (H) 2.81 (H) 3.73 (H) 4.50 (H) 5.48 (H)   From a review of her records, she suffered a similar event in November 2015 when she had dialysis dependent acute renal failure.   Past Medical History:  Diagnosis Date  . Allergy   . Diabetes mellitus without complication (HCC)   . Mixed stress and urge urinary incontinence   . Pneumonia   . Schizophrenia (HCC)     History reviewed. No pertinent surgical history.  Family History  Problem Relation Age of Onset  . Heart disease Mother   . Hypertension Father   . Stroke Father   . Heart  disease Father   . Diabetes Father   . Emphysema Paternal Grandfather     Social History:  reports that she has quit smoking. Her smoking use included Cigarettes. She smoked 3.00 packs per day. She has never used smokeless tobacco. She reports that she drinks about 3.6 oz of alcohol per week . She reports that she does not use drugs.  Allergies:  Allergies  Allergen Reactions  . Penicillins     Rash     Medications:  Scheduled: . ARIPiprazole  15 mg Oral Daily  . benztropine  1 mg Oral QHS  . buPROPion  75 mg Oral BID  . cefpodoxime  200 mg Oral Q24H  . chlorhexidine  15 mL Mouth Rinse BID  . furosemide  40 mg Oral Daily  . heparin subcutaneous  5,000 Units Subcutaneous Q8H  . insulin aspart  1-3 Units Subcutaneous Q4H  . insulin glargine  10 Units Subcutaneous Q24H  . mouth rinse  15 mL Mouth Rinse q12n4p  . mirtazapine  15 mg Oral QHS  . multivitamin  15 mL Per Tube Daily  . pantoprazole  40 mg Oral QHS    BMP Latest Ref Rng & Units 04/20/2017 04/19/2017 04/18/2017  Glucose 65 - 99 mg/dL 68 92 161(W)  BUN 6 - 20 mg/dL 96(E) 45(W) 09(W)  Creatinine 0.44 - 1.00 mg/dL 1.19(J) 4.78(G) 9.56(O)  Sodium 135 - 145 mmol/L 140 138 140  Potassium 3.5 - 5.1 mmol/L 3.6 4.1 3.8  Chloride 101 -  111 mmol/L 105 105 106  CO2 22 - 32 mmol/L 19(L) 22 22  Calcium 8.9 - 10.3 mg/dL 8.1(L) 7.6(L) 7.5(L)   CBC Latest Ref Rng & Units 04/19/2017 04/18/2017 04/17/2017  WBC 4.0 - 10.5 K/uL 6.3 6.6 10.9(H)  Hemoglobin 12.0 - 15.0 g/dL 1.6(X9.4(L) 0.9(U9.5(L) 11.2(L)  Hematocrit 36.0 - 46.0 % 27.6(L) 28.5(L) 32.5(L)  Platelets 150 - 400 K/uL 93(L) 88(L) 162     [place] Renal  Result Date: 04/18/2017 CLINICAL DATA:  Acute renal failure EXAM: RENAL / URINARY TRACT ULTRASOUND COMPLETE COMPARISON:  October 04, 2014 FINDINGS: Right Kidney: Length: 13.0 cm. Echogenicity and renal cortical thickness are within normal limits. No mass, perinephric fluid, or hydronephrosis visualized. No sonographically demonstrable calculus  or ureterectasis. Left Kidney: Length: 13.9 cm. Echogenicity and renal cortical thickness are within normal limits. No mass, perinephric fluid, or hydronephrosis visualized. No sonographically demonstrable calculus or ureterectasis. Bladder: There is apparent urinary bladder wall thickening. No demonstrable mass appreciable by ultrasound. There is mild ascites. IMPRESSION: Question a degree of cystitis. No renal lesions evident by ultrasound. Mild ascites. Electronically Signed   By: Bretta BangWilliam  Woodruff III M.D.   On: 04/18/2017 16:37   Dg Chest Port 1 View  Result Date: 04/19/2017 CLINICAL DATA:  Hypoxia EXAM: PORTABLE CHEST 1 VIEW COMPARISON:  Apr 18, 2017 FINDINGS: There is persistent interstitial edema bilaterally. There are small pleural effusions bilaterally, slightly larger on the right than on the left. There is no frank airspace consolidation. Heart is mildly enlarged. There is mild pulmonary venous hypertension. There is aortic atherosclerosis. No evident adenopathy. No bone lesions. IMPRESSION: Persistent changes of congestive heart failure. Degree of edema is similar to 1 day prior. Small pleural effusions are noted. Stable cardiac silhouette. There is aortic atherosclerosis. Electronically Signed   By: Bretta BangWilliam  Woodruff III M.D.   On: 04/19/2017 07:59   Dg Chest Port 1 View  Result Date: 04/18/2017 CLINICAL DATA:  66 year old female with chest pain. EXAM: PORTABLE CHEST 1 VIEW COMPARISON:  0443 hours today and earlier. FINDINGS: Portable AP semi upright view at 2029 hours. Mildly lower lung volumes. Increasing perihilar interstitial opacity and indistinctness of pulmonary vasculature. Stable cardiac size and mediastinal contours. Visualized tracheal air column is within normal limits. No pneumothorax. No definite pleural effusion or consolidation. IMPRESSION: Increasing perihilar and indistinct interstitial opacity most suggestive of acute pulmonary edema. Electronically Signed   By: Odessa FlemingH  Hall M.D.    On: 04/18/2017 20:43    Review of Systems  Constitutional: Negative for chills, fever and malaise/fatigue.  HENT: Negative.   Eyes: Negative.   Respiratory: Positive for cough, sputum production and shortness of breath. Negative for wheezing.   Cardiovascular: Positive for leg swelling. Negative for chest pain, palpitations and orthopnea.  Gastrointestinal: Positive for nausea. Negative for abdominal pain, diarrhea and vomiting.  Genitourinary: Positive for urgency. Negative for dysuria, flank pain and hematuria.  Musculoskeletal: Positive for back pain. Negative for myalgias.  Skin: Negative.   Neurological: Positive for weakness.   Blood pressure 125/72, pulse 97, temperature 98.6 F (37 C), temperature source Oral, resp. rate 16, height 5\' 5"  (1.651 m), weight 76.7 kg (169 lb), SpO2 92 %. Physical Exam  Nursing note and vitals reviewed. Constitutional: She is oriented to person, place, and time. She appears well-developed and well-nourished. No distress.  HENT:  Head: Normocephalic and atraumatic.  Mouth/Throat: Oropharynx is clear and moist.  Eyes: Conjunctivae and EOM are normal. Pupils are equal, round, and reactive to light. No scleral icterus.  Neck: Normal range of motion. Neck supple. JVD present.  9-10 cm JVP. Gauze dressing over right neck.  Cardiovascular: Normal rate and regular rhythm.   Murmur heard. 2/6 holosystolic murmur  Respiratory: Effort normal. She has rales.  Coarse breath sounds with coarse rales  GI: Soft. Bowel sounds are normal. There is no tenderness. There is no rebound.  Musculoskeletal: She exhibits edema.  2-3+ upper extremity edema, 2+ lower extremity edema  Neurological: She is alert and oriented to person, place, and time.  Skin: Skin is warm and dry. No rash noted. No erythema.    Assessment/Plan: 1. Acute kidney injury on chronic kidney disease stage III: This appears to be most consistent with hemodynamically mediated acute kidney injury  following her presentation with diabetic ketoacidosis and PEA arrest. I will have a Foley catheter placed in order to better quantify urine output and to alleviate any problems that she might be having with bladder outlet obstruction. Will augment diuresis given her volume overloaded status. Repeat urine electrolytes and urinalysis today. No indications at this time to repeat renal ultrasound that was done 5 days ago and negative for hydronephrosis or other structural lesions of the kidney. We'll continue to closely monitor for any emerging needs for intervention. At this time, she does not have any compelling indications for dialysis. 2. Anion gap metabolic acidosis: Secondary to acute kidney injury-begin oral sodium bicarbonate for acid load buffering/potassium shift. 3. Diabetic ketoacidosis: In a state of resolution-now off insulin drip and transitioned over to basal/mealtime coverage of insulin. Acidemia persists likely on account of her acute kidney injury. 4. Status post PEA arrest with acute exacerbation of congestive heart failure: We'll increase dose of diuretics to overcome resistance from current acute kidney injury and to augment urine output. She remains volume overloaded. Because of her significant hypoalbuminemia, I will attempt a trial of intravenous albumin with Lasix to improve its pharmacokinetics/drug delivery. If she does not respond promptly to this, will try torsemide tomorrow. 5. Anemia: Appears to be consistent with anemia of chronic illness including chronic kidney disease compounded by her recent admission/critical illness. I will check iron studies and decide on need for supplementation versus initiation of ESA.  Whitfield Dulay K. 04/20/2017, 10:52 AM

## 2017-04-20 NOTE — Progress Notes (Signed)
Inpatient Diabetes Program Recommendations  AACE/ADA: New Consensus Statement on Inpatient Glycemic Control (2015)  Target Ranges:  Prepandial:   less than 140 mg/dL      Peak postprandial:   less than 180 mg/dL (1-2 hours)      Critically ill patients:  140 - 180 mg/dL   Lab Results  Component Value Date   GLUCAP 74 04/20/2017   HGBA1C 12.8 (H) 05/22/2014   Consult for DKA: Patient is not A&O currently. Also has a history of Schizophrenia. Also suspect anoxic brain injury. Will follow trends.   Glucose 70's this am. Lantus 10 ordered reduced from 20 units, in addition to Novolog 1-3 units Q4hours off of the ICU protocol.  Consider d/cing current correction scale and Order Novolog Sensitive Correction tid.  Thnaks,  Christena DeemShannon Schae Cando RN, MSN, [facility] Inpatient Diabetes Coordinator Team Pager [phone] (8a-5p)

## 2017-04-20 NOTE — Progress Notes (Signed)
  Speech Language Pathology Treatment: Dysphagia  Patient Details Name: Abigail RutherfordDeborah A Davis MRN: [id number] DOB: 04/25/1951 Today's Date: 04/20/2017 Time: [id number] SLP Time Calculation (min) (ACUTE ONLY): 20 min  Assessment / Plan / Recommendation Clinical Impression  Patient to assess toleration of diet following FEES assessment that recommended Dysphagia 3, nectar thick liquids diet. Patient exhibited multiple swallows with nectar thick liquids and delays in mastication and swallow initiation with saltine cracker (small pieces), but did not exhibit any overt s/s of aspiration or penetration. Patient was able to feed self and hold cup, however she accidentally knocked full cup of juice over on bedside table, secondary to her having difficulty with fine motor control of hands due to swelling. Patient said "they don't know what is causing it" (referring to the swelling of both her hands (left worse than right)   HPI HPI: 66 yo female with DKA, PEA cardiac arrest, acute renal failure, sepsis, VDRF 5/28-5/30. Hx of cognitive impairment, bipolar, CVA in 2015. Note MBS complete in 2015 during admission for similar dx, noted severe oropharyngeal dysphagia. FEES recommended after BSE.      SLP Plan  Continue with current plan of care       Recommendations  Diet recommendations: Dysphagia 3 (mechanical soft);Nectar-thick liquid Liquids provided via: Cup Medication Administration: Whole meds with puree Supervision: Intermittent supervision to cue for compensatory strategies Compensations: Slow rate;Small sips/bites Postural Changes and/or Swallow Maneuvers: Seated upright 90 degrees                SLP Visit Diagnosis: Dysphagia, pharyngeal phase (R13.13) Plan: Continue with current plan of care       GO               Angela NevinJohn T. Yani Lal, MA, CCC-SLP 04/20/17 4:48 PM

## 2017-04-20 NOTE — NC FL2 (Signed)
[place] MEDICAID FL2 LEVEL OF CARE SCREENING TOOL     IDENTIFICATION  Patient Name: [name] Birthdate: September 18, 1951 Sex: female Admission Date (Current Location): 04/15/2017  [facility] [place]   Facility and Address:  [facility], [address], [place], [place] [address]      Provider Number: [id number]  Attending Physician Name and Address:  Tyrone NineGrunz, Ryan B, MD  Relative Name and Phone Number:  Okey RegalCarol, sister, [phone]    Current Level of Care: Hospital Recommended Level of Care: Skilled Nursing Facility Prior Approval Number:    Date Approved/Denied:   PASRR Number: [id number]  Discharge Plan: SNF    Current Diagnoses: Patient Active Problem List   Diagnosis Date Noted  . Acute renal failure (ARF) (HCC) 04/19/2017  . Acute on chronic systolic CHF (congestive heart failure) (HCC) 04/19/2017  . Acute respiratory acidosis   . Arterial hypotension   . DKA (diabetic ketoacidoses) (HCC) 04/15/2017  . Thrombocytopenia (HCC) 09/26/2016  . Pressure injury of skin 09/25/2016  . Hyperkalemia 09/24/2016  . Hyponatremia 09/24/2016  . Metabolic acidosis 09/24/2016  . DKA, type 2 (HCC) 09/24/2016  . Pneumonia due to Staphylococcus (HCC) 11/08/2014  . 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 CVA (cerebral infarction) 11/08/2014  . Chronic systolic congestive heart failure (HCC) 11/08/2014  . Anemia of chronic disease 11/08/2014  . Dysphagia 11/08/2014  . Schizophrenia (HCC) 11/08/2014  . Bacteremia due to Staphylococcus aureus   . Acute respiratory failure (HCC)   . Dehydration   . Community acquired pneumonia   . Anoxic brain injury (HCC)   . Acute respiratory failure with hypoxia (HCC)   . AKI (acute kidney injury) (HCC)   . Septic shock (HCC) 10/02/2014  . Encephalopathy acute 10/02/2014  . Altered mental status 10/01/2014  . Foreign body alimentary tract 10/01/2014  . Shock (HCC)   . Cardiac  arrest (HCC)   . Foreign body ingestion   . Diabetes (HCC) 05/22/2014  . Diabetic ketoacidosis associated with type 2 diabetes mellitus (HCC) 05/22/2014  . Constipation 05/22/2014  . Mood disorder (HCC) 05/22/2014    Orientation RESPIRATION BLADDER Height & Weight     Self, Place, Time  O2 (Nasal cannula 3L) Continent Weight: 76.7 kg (169 lb) (pt refused) Height:  5\' 5"  (165.1 cm)  BEHAVIORAL SYMPTOMS/MOOD NEUROLOGICAL BOWEL NUTRITION STATUS      Incontinent Diet (Please see DC Summary)  AMBULATORY STATUS COMMUNICATION OF NEEDS Skin   Limited Assist Verbally Normal                       Personal Care Assistance Level of Assistance  Bathing, Feeding, Dressing Bathing Assistance: Limited assistance Feeding assistance: Limited assistance Dressing Assistance: Limited assistance     Functional Limitations Info             SPECIAL CARE FACTORS FREQUENCY  PT (By licensed PT)     PT Frequency: 5x/week              Contractures Contractures Info: Not present    Additional Factors Info  Code Status, Allergies, Isolation Precautions, Psychotropic, Insulin Sliding Scale Code Status Info: Full Code Allergies Info: Penicillins Psychotropic Info: Wellbutrin Insulin Sliding Scale Info: Every 4 hours Isolation Precautions Info: MRSA     Current Medications (04/20/2017):  This is the current hospital active medication list Current Facility-Administered Medications  Medication Dose Route Frequency Provider Last Rate Last Dose  . acetaminophen (TYLENOL) tablet  650 mg  650 mg Oral Q6H PRN Roslynn Amble, MD   650 mg at 04/18/17 1954  . albumin human 25 % solution 12.5 g  12.5 g Intravenous Once Zetta Bills, MD       Followed by  . furosemide (LASIX) injection 80 mg  80 mg Intravenous Once Zetta Bills, MD      . Melene Muller ON 04/21/2017] albumin human 25 % solution 12.5 g  12.5 g Intravenous Once Zetta Bills, MD       Followed by  . [START ON 04/21/2017] furosemide (LASIX)  injection 80 mg  80 mg Intravenous Once Zetta Bills, MD      . albumin human 25 % solution 25 g  25 g Intravenous Once Zetta Bills, MD       Followed by  . furosemide (LASIX) injection 80 mg  80 mg Intravenous Once Zetta Bills, MD      . albuterol (PROVENTIL) (2.5 MG/3ML) 0.083% nebulizer solution 2.5 mg  2.5 mg Nebulization Q2H PRN Karl Ito, MD   2.5 mg at 04/18/17 1241  . ARIPiprazole (ABILIFY) tablet 15 mg  15 mg Oral Daily Tyrone Nine, MD   15 mg at 04/20/17 1017  . benztropine (COGENTIN) tablet 1 mg  1 mg Oral QHS Tyrone Nine, MD   1 mg at 04/19/17 2101  . buPROPion [facility]) tablet 75 mg  75 mg Oral BID Tyrone Nine, MD   75 mg at 04/20/17 1017  . cefpodoxime (VANTIN) tablet 200 mg  200 mg Oral Q24H Hazeline Junker B, MD   200 mg at 04/19/17 1753  . chlorhexidine (PERIDEX) 0.12 % solution 15 mL  15 mL Mouth Rinse BID Nelda Bucks, MD   15 mL at 04/20/17 1019  . fluconazole (DIFLUCAN) tablet 150 mg  150 mg Oral Q72H Tyrone Nine, MD      . guaiFENesin-dextromethorphan (ROBITUSSIN DM) 100-10 MG/5ML syrup 5 mL  5 mL Oral Q4H PRN Tobey Grim, NP      . heparin injection 5,000 Units  5,000 Units Subcutaneous Q8H Coralyn Helling, MD   5,000 Units at 04/20/17 0519  . insulin aspart (novoLOG) injection 1-3 Units  1-3 Units Subcutaneous Q4H Nelda Bucks, MD   2 Units at 04/19/17 2352  . insulin glargine (LANTUS) injection 10 Units  10 Units Subcutaneous Q24H Tyrone Nine, MD   10 Units at 04/19/17 1152  . MEDLINE mouth rinse  15 mL Mouth Rinse q12n4p Nelda Bucks, MD   15 mL at 04/18/17 1700  . mirtazapine (REMERON) tablet 15 mg  15 mg Oral QHS Tyrone Nine, MD   15 mg at 04/19/17 2101  . multivitamin liquid 15 mL  15 mL Per Tube Daily Coralyn Helling, MD   15 mL at 04/20/17 1016  . pantoprazole (PROTONIX) EC tablet 40 mg  40 mg Oral QHS Silvana Newness, RPH   40 mg at 04/19/17 2101  . RESOURCE THICKENUP CLEAR   Oral PRN Tyrone Nine, MD         Discharge  Medications: Please see discharge summary for a list of discharge medications.  Relevant Imaging Results:  Relevant Lab Results:   Additional Information SSN [id number] [place], [place]

## 2017-04-21 LAB — URINALYSIS, ROUTINE W REFLEX MICROSCOPIC
Bilirubin Urine: NEGATIVE
Glucose, UA: 50 mg/dL — AB
Ketones, ur: NEGATIVE mg/dL
NITRITE: NEGATIVE
PROTEIN: NEGATIVE mg/dL
SPECIFIC GRAVITY, URINE: 1.006 (ref 1.005–1.030)
pH: 5 (ref 5.0–8.0)

## 2017-04-21 LAB — IRON AND TIBC: Iron: 11 ug/dL — ABNORMAL LOW (ref 28–170)

## 2017-04-21 LAB — CBC
HEMATOCRIT: 31.2 % — AB (ref 36.0–46.0)
HEMOGLOBIN: 10 g/dL — AB (ref 12.0–15.0)
MCH: 26.2 pg (ref 26.0–34.0)
MCHC: 32.1 g/dL (ref 30.0–36.0)
MCV: 81.7 fL (ref 78.0–100.0)
Platelets: 111 10*3/uL — ABNORMAL LOW (ref 150–400)
RBC: 3.82 MIL/uL — ABNORMAL LOW (ref 3.87–5.11)
RDW: 17.7 % — AB (ref 11.5–15.5)
WBC: 5.1 10*3/uL (ref 4.0–10.5)

## 2017-04-21 LAB — SODIUM, URINE, RANDOM: SODIUM UR: 104 mmol/L

## 2017-04-21 LAB — RENAL FUNCTION PANEL
ALBUMIN: 1.9 g/dL — AB (ref 3.5–5.0)
ANION GAP: 14 (ref 5–15)
BUN: 79 mg/dL — AB (ref 6–20)
CALCIUM: 8 mg/dL — AB (ref 8.9–10.3)
CO2: 20 mmol/L — ABNORMAL LOW (ref 22–32)
Chloride: 107 mmol/L (ref 101–111)
Creatinine, Ser: 6.5 mg/dL — ABNORMAL HIGH (ref 0.44–1.00)
GFR calc Af Amer: 7 mL/min — ABNORMAL LOW (ref >60)
GFR, EST NON AFRICAN AMERICAN: 6 mL/min — AB (ref >60)
GLUCOSE: 289 mg/dL — AB (ref 65–99)
PHOSPHORUS: 3 mg/dL (ref 2.5–4.6)
POTASSIUM: 3.4 mmol/L — AB (ref 3.5–5.1)
SODIUM: 141 mmol/L (ref 135–145)

## 2017-04-21 LAB — HEMOGLOBIN A1C
Hgb A1c MFr Bld: 14.5 % — ABNORMAL HIGH (ref 4.8–5.6)
Mean Plasma Glucose: 369 mg/dL

## 2017-04-21 LAB — GLUCOSE, CAPILLARY
GLUCOSE-CAPILLARY: 203 mg/dL — AB (ref 65–99)
GLUCOSE-CAPILLARY: 240 mg/dL — AB (ref 65–99)
Glucose-Capillary: 306 mg/dL — ABNORMAL HIGH (ref 65–99)
Glucose-Capillary: 259 mg/dL — ABNORMAL HIGH (ref 65–99)

## 2017-04-21 LAB — FOLATE: FOLATE: 12.2 ng/mL (ref >5.9)

## 2017-04-21 LAB — UREA NITROGEN, URINE: UREA NITROGEN UR: 107 mg/dL

## 2017-04-21 LAB — VITAMIN B12: Vitamin B-12: 2158 pg/mL — ABNORMAL HIGH (ref 180–914)

## 2017-04-21 LAB — RETICULOCYTES
RBC.: 3.82 MIL/uL — ABNORMAL LOW (ref 3.87–5.11)
RETIC COUNT ABSOLUTE: 45.8 10*3/uL (ref 19.0–186.0)
Retic Ct Pct: 1.2 % (ref 0.4–3.1)

## 2017-04-21 LAB — FERRITIN: Ferritin: 918 ng/mL — ABNORMAL HIGH (ref 11–307)

## 2017-04-21 MED ORDER — FUROSEMIDE 10 MG/ML IJ SOLN
80.0000 mg | Freq: Once | INTRAMUSCULAR | Status: AC
  Administered 2017-04-22: 80 mg via INTRAVENOUS
  Filled 2017-04-21: qty 8

## 2017-04-21 MED ORDER — ALBUMIN HUMAN 25 % IV SOLN
12.5000 g | Freq: Once | INTRAVENOUS | Status: AC
  Administered 2017-04-22: 12.5 g via INTRAVENOUS
  Filled 2017-04-21: qty 50

## 2017-04-21 MED ORDER — FUROSEMIDE 10 MG/ML IJ SOLN
80.0000 mg | Freq: Once | INTRAMUSCULAR | Status: AC
  Administered 2017-04-21: 80 mg via INTRAVENOUS
  Filled 2017-04-21: qty 8

## 2017-04-21 MED ORDER — ALBUMIN HUMAN 25 % IV SOLN
12.5000 g | Freq: Once | INTRAVENOUS | Status: AC
  Administered 2017-04-21: 12.5 g via INTRAVENOUS
  Filled 2017-04-21: qty 50

## 2017-04-21 NOTE — Progress Notes (Signed)
Patient ID: [name], female   DOB: [birth year], 66 y.o.   MRN: [id number]  [facility] Progress Note   Assessment/ Plan:   1. Acute kidney injury on chronic kidney disease stage III: Likely hemodynamically mediated acute kidney injury following her presentation with diabetic ketoacidosis and PEA arrest. Urine electrolytes pointing to ATN. Good urine output in response to furosemide overnight and she remains with volume excess and physical exam. Labs from this morning are pending 2. Anion gap metabolic acidosis: Secondary to acute kidney injury-started on oral sodium bicarbonate and labs pending at this time. 3. Diabetic ketoacidosis:  resolved-current acidemia likely from acute kidney injury 4. Status post PEA arrest with acute exacerbation of congestive heart failure:  good urine output noted overnight-will continue the combination of albumin with furosemide as it appears to be efficacious in inducing a net negative fluid balance. 5. Anemia: Appears to be consistent with anemia of chronic illness including chronic kidney disease compounded by her recent admission/critical illness. Iron studies pending  Subjective:   Reports to be feeling somewhat better-still having shortness of breath    Objective:   BP 110/62   Pulse 90   Temp 97.9 F (36.6 C) (Oral)   Resp 16   Ht 5\' 5"  (1.651 m)   Wt 76.8 kg (169 lb 4.8 oz)   SpO2 97%   BMI 28.17 kg/m   Intake/Output Summary (Last 24 hours) at 04/21/17 1121 Last data filed at 04/21/17 1037  Gross per 24 hour  Intake              280 ml  Output             2346 ml  Net            -2066 ml   Weight change: 0.136 kg (4.8 oz)  Physical Exam: [id number]:Appears to be relatively comfortable sitting up in recliner CVS: Pulse regular rhythm, normal rate, 2/6 holosystolic murmur Resp: Coarse breath sounds bilaterally with coarse rales over both lung fields Abd: Soft, nontender, bowel sounds normal Ext: 2+ upper and lower  extremity edema  Imaging: No results found.  Labs: BMET  Recent Labs Lab 04/15/17 1459 04/16/17 0233 04/16/17 1200 04/16/17 2141 04/17/17 0500 04/18/17 0340 04/19/17 0218 04/20/17 0653  NA 143 137 138 135 139 140 138 140  K 2.8* 3.2* 2.7* 3.6 4.4 3.8 4.1 3.6  CL 123* 111 108 108 110 106 105 105  CO2 <7* 14* 18* 18* 20* 22 22 19*  GLUCOSE 418* 364* 215* 276* 166* 145* 92 68  BUN 19 32* 33* 39* 43* 50* 58* 70*  CREATININE 1.40* 2.11* 2.29* 2.55* 2.81* 3.73* 4.50* 5.48*  CALCIUM 5.3* 6.8* 7.0* 7.1* 7.3* 7.5* 7.6* 8.1*  PHOS 4.2 <1.0*  --   --  1.1* 1.8*  --  2.4*   CBC  Recent Labs Lab 04/15/17 1137  04/16/17 0233 04/17/17 0500 04/18/17 0425 04/19/17 0218  WBC 14.4*  --  6.8 10.9* 6.6 6.3  NEUTROABS 11.9*  --  5.5 9.7*  --   --   HGB 13.7  < > 11.7* 11.2* 9.5* 9.4*  HCT 44.7  < > 35.0* 32.5* 28.5* 27.6*  MCV 87.8  --  79.2 77.2* 78.5 79.3  PLT 323  --  153 162 88* 93*  < > = values in this interval not displayed.  Medications:    . ARIPiprazole  15 mg Oral Daily  . benztropine  1 mg Oral QHS  . buPROPion  75 mg Oral BID  . cefpodoxime  200 mg Oral Q24H  . chlorhexidine  15 mL Mouth Rinse BID  . fluconazole  150 mg Oral Q72H  . heparin subcutaneous  5,000 Units Subcutaneous Q8H  . insulin aspart  0-9 Units Subcutaneous TID WC  . mouth rinse  15 mL Mouth Rinse q12n4p  . mirtazapine  15 mg Oral QHS  . multivitamin  15 mL Per Tube Daily  . pantoprazole  40 mg Oral QHS   Zetta Bills, MD 04/21/2017, 11:21 AM

## 2017-04-21 NOTE — Progress Notes (Signed)
Inpatient Diabetes Program Recommendations  AACE/ADA: New Consensus Statement on Inpatient Glycemic Control (2015)  Target Ranges:  Prepandial:   less than 140 mg/dL      Peak postprandial:   less than 180 mg/dL (1-2 hours)      Critically ill patients:  140 - 180 mg/dL   Lab Results  Component Value Date   GLUCAP 203 (H) 04/21/2017   HGBA1C 14.5 (H) 04/20/2017   Review of Glycemic Control  Diabetes history: DM 2 Outpatient Diabetes medications: Levemir 20 units QHS, Humalog 10 units BID Current orders for Inpatient glycemic control: Novolog Sensitive tid  A1c 14.5% on 04/20/17 Hgb was 9.4 at that time  Inpatient Diabetes Program Recommendations:    Glucose this am 203 mg/dl after Lantus 10 units given last night. Patient's glucose yesterday morning was 74 mg/dl by CBG. Please consider adding back Lantus 7-10 units (7 units is 0.1 units/kg)  Thanks,  Christena DeemShannon Kameka Whan RN, MSN, [facility] Inpatient Diabetes Coordinator Team Pager [phone] (8a-5p)

## 2017-04-21 NOTE — Progress Notes (Signed)
PROGRESS NOTE  [name]  ZOX:[id number] DOB: [birth year] DOA: 04/15/2017 PCP: Lindley Magnus, PA-C   Brief Narrative: [name] is a 66 y.o. female with a history of schizophrenia, poorly-controlled diabetes and DKA who presented to the ED after her family was unable to wake her from sleep. On arrival she was hyperglycemic, acidotic, and hypotensive, developing into PEA arrest with ROSC after about 5 minutes. She was intubated, put on pressors and insulin infusion, with eventual improvement. Sputum culture grew S. agalactiae consistent with streptococcal pneumonia, started antibiotics 5/29. She was extubated 5/30, off pressors since 5/31, and started back on basal/bolus insulin. Hospitalists assumed care 6/1. Renal failure has persisted/worsened and nephrology is consulted.   Assessment & Plan: Principal Problem:   DKA (diabetic ketoacidoses) (HCC) Active Problems:   Cardiac arrest (HCC)   Septic shock (HCC)   Acute respiratory failure with hypoxia (HCC)   Anoxic brain injury (HCC)   Schizophrenia (HCC)   Thrombocytopenia (HCC)   Arterial hypotension   Acute respiratory acidosis   Acute renal failure (ARF) (HCC)   Acute on chronic systolic CHF (congestive heart failure) (HCC)  Acute hypoxemic respiratory failure: In setting of DKA, septic shock due to streptococcal pneumonia (S. agalactiae on sputum culture). Also suspect element of volume overload. Extubated 5/30. - Continue oxygen prn - Treat pneumonia (strep. agalactiae) with 7 days abx (ceftriaxone > cefpodoxime 6/1) - Treat volume overload with albumin/lasix. Monitor I/O, daily weights.   Acute renal failure: Suspect ATN due to PEA arrest, septic shock. Renal U/S shows 13.0 / 13.9cm kidneys with normal echogenicity and cortical thickness, no hydro ?cystitis. FENa elevated at 15%. - UOP improved (vs. more accurate documentation after foley) with albumin/lasix. Metabolic panel not yet drawn. Will reorder.  -  Appreciate nephrology assistance. No indications for HD at this time. - Abx will cover cystitis - With metabolic acidosis, on bicarbonate during AKI, not related to now resolved DKA.  Anemia: Normocytic. - Anemia panel ordered, AM labs not yet drawn.  - Follow up CBC and panel as above. May need iron vs. ESA  Acute HFrEF s/p PEA cardiac arrest: Echo 5/29: LVEF: 35-40%, diffuse hypokinesis, g1dd, increased PA peak pressures. Received IVF's for septic shock initially, now requiring diuresis. Weaned off pressors 5/31.  - Giving albumin + lasix, volume status improved - Daily weight, I/O  DKA and uncontrolled IDDM: Last HbA1c was 12.8%.  - Titrating insulin, decrease lantus and monitor with SSI TID - Recheck HbA1c  - Diabetes coordinator consulted  Troponin elevation: Flat trend in setting of acute renal failure and following PEA arrest, denying chest pain today. ECG without ischemic changes.  - Monitor.  Hyperkalemia - resolved Hypokalemia - resolved  Elevated LFTs: Suspect ischemic insult during PEA arrest, mild, downtrending.  - Monitor 6/4  Suspected anoxic brain injury and history of cognitive impairment, schizophrenia, bipolar disorder: Sister suspects she's been off medications for a while because she forgets to take them.  - Restarted outpatient medications (abilify, cogentin, wellbutrin, remeron) 6/1, mental status improving.  DVT prophylaxis: Subcutaneous heparin Code Status: Full Family Communication: None at bedside this AM Disposition Plan: Anticipate DC to SNF once work up and treatment of renal failure and septic shock completed.  Consultants:   PCCM primary until 6/1  Nephrology 6/2  Procedures:  ETT 5/28 >> 5/30 Rt IJ CVL 5/28 >> 5/31  Antimicrobials:  Ceftriaxone > cefpodoxime  Subjective: Pt more alert, interactive, sitting in bedside chair. Urine output has been good, reporting improved arm  swelling. + cough without dyspnea or chest  pain.  Objective: Vitals:   04/20/17 2153 04/21/17 0528 04/21/17 0746 04/21/17 0808  BP: 117/70 113/70 117/67 110/62  Pulse: 97 88 72 90  Resp: 16 16 16 16   Temp: 98.4 F (36.9 C) 98.5 F (36.9 C) 98.1 F (36.7 C) 97.9 F (36.6 C)  TempSrc: Oral Oral Oral Oral  SpO2: 99% 93% 96% 97%  Weight: 76.8 kg (169 lb 4.8 oz)     Height:        Intake/Output Summary (Last 24 hours) at 04/21/17 0957 Last data filed at 04/21/17 0530  Gross per 24 hour  Intake              280 ml  Output             1851 ml  Net            -1571 ml   Filed Weights   04/19/17 0520 04/19/17 2004 04/20/17 2153  Weight: 77.4 kg (170 lb 10.2 oz) 76.7 kg (169 lb) 76.8 kg (169 lb 4.8 oz)    Examination: General exam: Ill-appearing female in no acute distress, more alert today. Respiratory system: Non-labored breathing with supplemental oxygen. Coarse, with bibasilar crackles. Coughs with deep inspiration. Cardiovascular system: Regular rate and rhythm. II/VI holosystolic murmur. 1+ UE edema, trace pedal edema - Much improved. Gastrointestinal system: Abdomen soft, non-tender, non-distended, with normoactive bowel sounds. No organomegaly or masses felt. Central nervous system: Alert, oriented to hospital, keeps saying she's surprised I came to visit her. No focal neurological deficits. Extremities: Warm, no deformities  Skin: No rashes, lesions no ulcers Psychiatry: Judgement and insight appear impaired. Cognitive impairment, short term recall impaired. Mood & affect appropriate.   Data Reviewed: I have personally reviewed following labs and imaging studies  CBC:  Recent Labs Lab 04/15/17 1137 04/15/17 1156 04/16/17 0233 04/17/17 0500 04/18/17 0425 04/19/17 0218  WBC 14.4*  --  6.8 10.9* 6.6 6.3  NEUTROABS 11.9*  --  5.5 9.7*  --   --   HGB 13.7 15.0 11.7* 11.2* 9.5* 9.4*  HCT 44.7 44.0 35.0* 32.5* 28.5* 27.6*  MCV 87.8  --  79.2 77.2* 78.5 79.3  PLT 323  --  153 162 88* 93*   Basic Metabolic  Panel:  Recent Labs Lab 04/15/17 1459 04/16/17 0233  04/16/17 2141 04/17/17 0500 04/18/17 0340 04/19/17 0218 04/20/17 0653  NA 143 137  < > 135 139 140 138 140  K 2.8* 3.2*  < > 3.6 4.4 3.8 4.1 3.6  CL 123* 111  < > 108 110 106 105 105  CO2 <7* 14*  < > 18* 20* 22 22 19*  GLUCOSE 418* 364*  < > 276* 166* 145* 92 68  BUN 19 32*  < > 39* 43* 50* 58* 70*  CREATININE 1.40* 2.11*  < > 2.55* 2.81* 3.73* 4.50* 5.48*  CALCIUM 5.3* 6.8*  < > 7.1* 7.3* 7.5* 7.6* 8.1*  MG 1.3* 1.3*  --  2.6*  --  2.3  --   --   PHOS 4.2 <1.0*  --   --  1.1* 1.8*  --  2.4*  < > = values in this interval not displayed. GFR: Estimated Creatinine Clearance: 10.6 mL/min (A) (by C-G formula based on SCr of 5.48 mg/dL (H)). Liver Function Tests:  Recent Labs Lab 04/15/17 1137 04/17/17 0500 04/18/17 0340 04/20/17 0653  AST 16 156* 88*  --   ALT 15 230* 157*  --  ALKPHOS 141* 223* 189*  --   BILITOT 1.6* 0.7 0.9  --   PROT 6.9 4.6* 4.7*  --   ALBUMIN 3.0* 1.6* 1.7* 1.5*   No results for input(s): LIPASE, AMYLASE in the last 168 hours. No results for input(s): AMMONIA in the last 168 hours. Coagulation Profile: No results for input(s): INR, PROTIME in the last 168 hours. Cardiac Enzymes:  Recent Labs Lab 04/16/17 0233 04/18/17 2040 04/19/17 0218 04/19/17 1116  TROPONINI 0.18* 0.18* 0.20* 0.17*   BNP (last 3 results) No results for input(s): PROBNP in the last 8760 hours. HbA1C:  Recent Labs  04/20/17 0653  HGBA1C 14.5*   CBG:  Recent Labs Lab 04/19/17 2336 04/20/17 0359 04/20/17 0748 04/20/17 1201 04/21/17 0757  GLUCAP 156* 94 74 115* 203*   Lipid Profile: No results for input(s): CHOL, HDL, LDLCALC, TRIG, CHOLHDL, LDLDIRECT in the last 72 hours. Thyroid Function Tests: No results for input(s): TSH, T4TOTAL, FREET4, T3FREE, THYROIDAB in the last 72 hours. Anemia Panel: No results for input(s): VITAMINB12, FOLATE, FERRITIN, TIBC, IRON, RETICCTPCT in the last 72 hours. Urine  analysis:    Component Value Date/Time   COLORURINE STRAW (A) 04/20/2017 1358   APPEARANCEUR CLEAR 04/20/2017 1358   LABSPEC 1.005 04/20/2017 1358   PHURINE 5.0 04/20/2017 1358   GLUCOSEU NEGATIVE 04/20/2017 1358   HGBUR SMALL (A) 04/20/2017 1358   BILIRUBINUR NEGATIVE 04/20/2017 1358   BILIRUBINUR neg 05/22/2014 0935   KETONESUR NEGATIVE 04/20/2017 1358   PROTEINUR NEGATIVE 04/20/2017 1358   UROBILINOGEN 0.2 10/14/2014 0545   NITRITE NEGATIVE 04/20/2017 1358   LEUKOCYTESUR NEGATIVE 04/20/2017 1358   Recent Results (from the past 240 hour(s))  Blood Culture (routine x 2)     Status: None   Collection Time: 04/15/17 12:24 PM  Result Value Ref Range Status   Specimen Description BLOOD LEFT HAND  Final   Special Requests IN PEDIATRIC BOTTLE Blood Culture adequate volume  Final   Culture NO GROWTH 5 DAYS  Final   Report Status 04/20/2017 FINAL  Final  Urine culture     Status: None   Collection Time: 04/15/17  2:53 PM  Result Value Ref Range Status   Specimen Description URINE, CATHETERIZED  Final   Special Requests NONE  Final   Culture NO GROWTH  Final   Report Status 04/16/2017 FINAL  Final  MRSA PCR Screening     Status: None   Collection Time: 04/15/17  2:59 PM  Result Value Ref Range Status   MRSA by PCR NEGATIVE NEGATIVE Final    Comment:        The GeneXpert MRSA Assay (FDA approved for NASAL specimens only), is one component of a comprehensive MRSA colonization surveillance program. It is not intended to diagnose MRSA infection nor to guide or monitor treatment for MRSA infections.   Culture, blood (Routine X 2) w Reflex to ID Panel     Status: None   Collection Time: 04/15/17  7:15 PM  Result Value Ref Range Status   Specimen Description BLOOD LEFT HAND  Final   Special Requests IN PEDIATRIC BOTTLE Blood Culture adequate volume  Final   Culture NO GROWTH 5 DAYS  Final   Report Status 04/20/2017 FINAL  Final  Culture, respiratory (NON-Expectorated)      Status: None   Collection Time: 04/16/17 10:15 AM  Result Value Ref Range Status   Specimen Description TRACHEAL ASPIRATE  Final   Special Requests NONE  Final   Gram Stain   Final  DEGENERATED CELLULAR MATERIAL PRESENT ABUNDANT GRAM POSITIVE COCCI IN PAIRS    Culture   Final    ABUNDANT STREPTOCOCCUS AGALACTIAE TESTING AGAINST S. AGALACTIAE NOT ROUTINELY PERFORMED DUE TO PREDICTABILITY OF AMP/PEN/VAN SUSCEPTIBILITY.    Report Status 04/18/2017 FINAL  Final      Radiology Studies: No results found.  Scheduled Meds: . ARIPiprazole  15 mg Oral Daily  . benztropine  1 mg Oral QHS  . buPROPion  75 mg Oral BID  . cefpodoxime  200 mg Oral Q24H  . chlorhexidine  15 mL Mouth Rinse BID  . fluconazole  150 mg Oral Q72H  . heparin subcutaneous  5,000 Units Subcutaneous Q8H  . insulin aspart  0-9 Units Subcutaneous TID WC  . mouth rinse  15 mL Mouth Rinse q12n4p  . mirtazapine  15 mg Oral QHS  . multivitamin  15 mL Per Tube Daily  . pantoprazole  40 mg Oral QHS   Continuous Infusions:    LOS: 6 days   Time spent: 25 minutes.  Hazeline Junker, MD [facility] Pager [phone]  If 7PM-7AM, please contact night-coverage [email or web address] Password [email or web address] 04/21/2017, 9:57 AM

## 2017-04-22 LAB — GLUCOSE, CAPILLARY
GLUCOSE-CAPILLARY: 262 mg/dL — AB (ref 65–99)
GLUCOSE-CAPILLARY: 241 mg/dL — AB (ref 65–99)
GLUCOSE-CAPILLARY: 229 mg/dL — AB (ref 65–99)
Glucose-Capillary: 192 mg/dL — ABNORMAL HIGH (ref 65–99)
Glucose-Capillary: 254 mg/dL — ABNORMAL HIGH (ref 65–99)

## 2017-04-22 LAB — BASIC METABOLIC PANEL
Anion gap: 15 (ref 5–15)
BUN: 86 mg/dL — AB (ref 6–20)
CO2: 21 mmol/L — ABNORMAL LOW (ref 22–32)
Calcium: 7.9 mg/dL — ABNORMAL LOW (ref 8.9–10.3)
Chloride: 105 mmol/L (ref 101–111)
Creatinine, Ser: 7.07 mg/dL — ABNORMAL HIGH (ref 0.44–1.00)
GFR, EST AFRICAN AMERICAN: 6 mL/min — AB (ref >60)
GFR, EST NON AFRICAN AMERICAN: 5 mL/min — AB (ref >60)
Glucose, Bld: 260 mg/dL — ABNORMAL HIGH (ref 65–99)
POTASSIUM: 4 mmol/L (ref 3.5–5.1)
SODIUM: 141 mmol/L (ref 135–145)

## 2017-04-22 LAB — HEPATIC FUNCTION PANEL
ALK PHOS: 134 U/L — AB (ref 38–126)
ALT: 35 U/L (ref 14–54)
AST: 15 U/L (ref 15–41)
Albumin: 1.9 g/dL — ABNORMAL LOW (ref 3.5–5.0)
BILIRUBIN INDIRECT: 0.7 mg/dL (ref 0.3–0.9)
Bilirubin, Direct: 0.2 mg/dL (ref 0.1–0.5)
TOTAL PROTEIN: 4.8 g/dL — AB (ref 6.5–8.1)
Total Bilirubin: 0.9 mg/dL (ref 0.3–1.2)

## 2017-04-22 MED ORDER — SODIUM CHLORIDE 0.9 % IV SOLN
510.0000 mg | INTRAVENOUS | Status: DC
  Administered 2017-04-22: 510 mg via INTRAVENOUS
  Filled 2017-04-22: qty 17

## 2017-04-22 MED ORDER — FUROSEMIDE 10 MG/ML IJ SOLN
80.0000 mg | Freq: Two times a day (BID) | INTRAMUSCULAR | Status: AC
  Administered 2017-04-22 – 2017-04-23 (×2): 80 mg via INTRAVENOUS
  Filled 2017-04-22 (×2): qty 8

## 2017-04-22 MED ORDER — CEFPODOXIME PROXETIL 200 MG PO TABS
200.0000 mg | ORAL_TABLET | ORAL | Status: AC
  Administered 2017-04-22: 200 mg via ORAL
  Filled 2017-04-22: qty 1

## 2017-04-22 MED ORDER — INSULIN ASPART 100 UNIT/ML ~~LOC~~ SOLN
0.0000 [IU] | Freq: Three times a day (TID) | SUBCUTANEOUS | Status: DC
  Administered 2017-04-22: 5 [IU] via SUBCUTANEOUS
  Administered 2017-04-22 – 2017-04-23 (×3): 8 [IU] via SUBCUTANEOUS
  Administered 2017-04-23: 5 [IU] via SUBCUTANEOUS
  Administered 2017-04-24: 3 [IU] via SUBCUTANEOUS
  Administered 2017-04-24: 5 [IU] via SUBCUTANEOUS
  Administered 2017-04-24: 8 [IU] via SUBCUTANEOUS
  Administered 2017-04-25: 3 [IU] via SUBCUTANEOUS
  Administered 2017-04-25: 11 [IU] via SUBCUTANEOUS
  Administered 2017-04-25: 15 [IU] via SUBCUTANEOUS

## 2017-04-22 MED ORDER — ALBUMIN HUMAN 25 % IV SOLN
12.5000 g | Freq: Two times a day (BID) | INTRAVENOUS | Status: AC
  Administered 2017-04-22 – 2017-04-23 (×2): 12.5 g via INTRAVENOUS
  Filled 2017-04-22 (×2): qty 50

## 2017-04-22 MED ORDER — INSULIN ASPART 100 UNIT/ML ~~LOC~~ SOLN
0.0000 [IU] | Freq: Every day | SUBCUTANEOUS | Status: DC
  Administered 2017-04-22 – 2017-04-23 (×2): 2 [IU] via SUBCUTANEOUS
  Administered 2017-04-24: 3 [IU] via SUBCUTANEOUS

## 2017-04-22 NOTE — Progress Notes (Signed)
Patient ID: [name], female   DOB: 07-13-1951, 66 y.o.   MRN: [id number]  [facility] Progress Note   Subjective:   Reports to be feeling somewhat better-still having shortness of breath    Objective:   BP (!) 116/57 (BP Location: Right Arm)   Pulse 84   Temp 98.3 F (36.8 C) (Oral)   Resp 18   Ht 5\' 5"  (1.651 m)   Wt 76.8 kg (169 lb 4.8 oz)   SpO2 95%   BMI 28.17 kg/m   Intake/Output Summary (Last 24 hours) at 04/22/17 1503 Last data filed at 04/22/17 1410  Gross per 24 hour  Intake              530 ml  Output             2725 ml  Net            -2195 ml   Weight change:   Physical Exam: WUJ:[id number]:Appears to be relatively comfortable sitting up in recliner CVS: Pulse regular rhythm, normal rate, 2/6 holosystolic murmur Resp: Coarse breath sounds bilaterally with coarse rales over both lung fields Abd: Soft, nontender, bowel sounds normal Ext: 2+ upper and lower extremity edema  Imaging: No results found.  Labs: BMET  Recent Labs Lab 04/16/17 0233  04/16/17 2141 04/17/17 0500 04/18/17 0340 04/19/17 0218 04/20/17 0653 04/21/17 1130 04/22/17 0541  NA 137  < > 135 139 140 138 140 141 141  K 3.2*  < > 3.6 4.4 3.8 4.1 3.6 3.4* 4.0  CL 111  < > 108 110 106 105 105 107 105  CO2 14*  < > 18* 20* 22 22 19* 20* 21*  GLUCOSE 364*  < > 276* 166* 145* 92 68 289* 260*  BUN 32*  < > 39* 43* 50* 58* 70* 79* 86*  CREATININE 2.11*  < > 2.55* 2.81* 3.73* 4.50* 5.48* 6.50* 7.07*  CALCIUM 6.8*  < > 7.1* 7.3* 7.5* 7.6* 8.1* 8.0* 7.9*  PHOS <1.0*  --   --  1.1* 1.8*  --  2.4* 3.0  --   < > = values in this interval not displayed. Iron/TIBC/Ferritin/ %Sat    Component Value Date/Time   IRON 11 (L) 04/21/2017 1130   TIBC NOT CALCULATED 04/21/2017 1130   FERRITIN 918 (H) 04/21/2017 1130   IRONPCTSAT NOT CALCULATED 04/21/2017 1130    Recent Labs Lab 04/16/17 0233 04/17/17 0500 04/18/17 0425 04/19/17 0218 04/21/17 1130  WBC 6.8 10.9* 6.6 6.3 5.1   NEUTROABS 5.5 9.7*  --   --   --   HGB 11.7* 11.2* 9.5* 9.4* 10.0*  HCT 35.0* 32.5* 28.5* 27.6* 31.2*  MCV 79.2 77.2* 78.5 79.3 81.7  PLT 153 162 88* 93* 111*    Medications:    . ARIPiprazole  15 mg Oral Daily  . benztropine  1 mg Oral QHS  . buPROPion  75 mg Oral BID  . cefpodoxime  200 mg Oral Q24H  . chlorhexidine  15 mL Mouth Rinse BID  . fluconazole  150 mg Oral Q72H  . heparin subcutaneous  5,000 Units Subcutaneous Q8H  . insulin aspart  0-15 Units Subcutaneous TID WC  . insulin aspart  0-5 Units Subcutaneous QHS  . mouth rinse  15 mL Mouth Rinse q12n4p  . mirtazapine  15 mg Oral QHS  . multivitamin  15 mL Per Tube Daily  . pantoprazole  40 mg Oral QHS     Background: 66 yo female.  PMH DM, HTN, CKD 3 (BL creatinine 1.7-2). Admitted w/DKA, had PEA arrest, UTI w/poss septic shock. Creatinine on admission 2.34 (5/28) and continued to rise. Renal consulted 6/2 when creatinine 5.48, marginal UOP. Of note had similar episode 09/2014 and required CRRT that admission.   Assessment/ Plan:     1. AKI on CKD3 - hemodynamically mediated AKI 2/2 DKA/PEA arrest in pt with no renal reserve (AKI 2015 requiring CRRT) Urine electrolytes pointing to ATN. Good UOP response to lasix 80 mg Q12H (last dosed 80 mg with albumin this AM). She remains with volume excess on exam.Rate of rise of creatinine may be slowing. Evaluate UOP and exam again 6/5 Continue lasix/albumin cocktail another 24 hours. No indication for HD yet 2. Anion gap metabolic acidosis: 2/2 AKI. Required bicarb drip earlier this admit. It appears never started on oral bicarb. Trend labs, start po if CO2 <20.  3. Diabetic ketoacidosis - resolved 4. Status post PEA arrest  5. Acute sCHF. LV EF 35% - 40% on ECHO 04/16/17. Good UOP with lasix and albumin. Continue same for another 24 hours.  6. Anemia: Chronic ds + Fe def (Fe 11, unable to calc tsat. Dose Feraheme 510 IV x 2 doses, 1 week apart.

## 2017-04-22 NOTE — Progress Notes (Signed)
Physical Therapy Treatment Patient Details Name: [name] MRN: [id number] DOB: 1951/04/04 Today's Date: 04/22/2017    History of Present Illness pt is a 66 yo female admitted through the ED on 04/15/17 who presents after feeling bad for several weeks with increased urine urgency. PEA, VDRF (extubated 5/30), pneumonia as well; Pt has a history of non-compliance with medications and was diagnosed with DKA. PMH significant for DM2 and schizophrenia.     PT Comments    Continuing work on functional mobility and activity tolerance;  Able to incr amb distance; pushed chair behind to boost her confidence to be able to incr amb distance; pt seemed pleased with her progress; ended session with SLP getting ready to her session;    Follow Up Recommendations  SNF;Supervision/Assistance - 24 hour     Equipment Recommendations  None recommended by PT    Recommendations for Other Services       Precautions / Restrictions Precautions Precautions: Fall    Mobility  Bed Mobility Overal bed mobility: Needs Assistance Bed Mobility: Supine to Sit     Supine to sit: Min assist     General bed mobility comments: Used rail mostly to push up; min handheld assist to scoot to EOB  Transfers Overall transfer level: Needs assistance Equipment used: Rolling walker (2 wheeled) Transfers: Sit to/from Stand Sit to Stand: Mod assist         General transfer comment: Light mod assist to power up; cues for hand placement and safety  Ambulation/Gait Ambulation/Gait assistance: Min assist;+2 safety/equipment Ambulation Distance (Feet): 60 Feet Assistive device: Rolling walker (2 wheeled) Gait Pattern/deviations: Step-through pattern;Decreased step length - right;Decreased step length - left;Decreased stride length     General Gait Details: Cues to self-monitor for activity tolerance; encouragement to push to acheive further distance   Stairs            Wheelchair Mobility     Modified Rankin (Stroke Patients Only)       Balance     Sitting balance-Leahy Scale: Fair       Standing balance-Leahy Scale: Poor                              Cognition Arousal/Alertness: Awake/alert Behavior During Therapy: WFL for tasks assessed/performed Overall Cognitive Status: Within Functional Limits for tasks assessed                                 General Comments: Needing encouragement to participate and push self to walk more      Exercises      General Comments        Pertinent Vitals/Pain Pain Assessment: No/denies pain    Home Living                      Prior Function            PT Goals (current goals can now be found in the care plan section) Acute Rehab PT Goals Patient Stated Goal: did not state, but agreeable to amb PT Goal Formulation: With patient Time For Goal Achievement: 05/03/17 Potential to Achieve Goals: Fair Progress towards PT goals: Progressing toward goals    Frequency    Min 2X/week      PT Plan Current plan remains appropriate    Co-evaluation  AM-PAC PT "6 Clicks" Daily Activity  Outcome Measure  Difficulty turning over in bed (including adjusting bedclothes, sheets and blankets)?: Total Difficulty moving from lying on back to sitting on the side of the bed? : Total Difficulty sitting down on and standing up from a chair with arms (e.g., wheelchair, bedside commode, etc,.)?: A Lot Help needed moving to and from a bed to chair (including a wheelchair)?: A Lot Help needed walking in hospital room?: A Lot Help needed climbing 3-5 steps with a railing? : A Lot 6 Click Score: 10    End of Session Equipment Utilized During Treatment: Gait belt Activity Tolerance: Patient tolerated treatment well Patient left: in chair;with call bell/phone within reach;with chair alarm set Nurse Communication: Mobility status PT Visit Diagnosis: Unsteadiness on feet  (R26.81);Difficulty in walking, not elsewhere classified (R26.2)     Time: [id number] PT Time Calculation (min) (ACUTE ONLY): 23 min  Charges:  $Gait Training: 23-37 mins                    G Codes:       Van ClinesHolly Ger Nicks, [place] CarolinaPT  Acute Rehabilitation Services Pager [phone] Office [phone]    Levi AlandHolly H Kaikoa Magro 04/22/2017, 11:19 AM

## 2017-04-22 NOTE — Progress Notes (Signed)
  Speech Language Pathology Treatment: Dysphagia  Patient Details Name: Abigail RutherfordDeborah A Zamora MRN: [id number] DOB: [birth year] Today's Date: 04/22/2017 Time: [id number] SLP Time Calculation (min) (ACUTE ONLY): 10 min  Assessment / Plan / Recommendation Clinical Impression  Pt reported coughing over the weekend but thought not related to food/liquid. No s/s aspiration with sips nectar thick diet ginger ale. Mastication with graham cracker was within functional limits. Reitertated no straws and treatment plan for return to thin liquids when safe whether that be before she is discharged or can be addressed once at SNF. She will need instrumental exam due to silent nature of laryngeal penetration. Will continue to intervene while at hospital.    HPI HPI: 66 yo female with DKA, PEA cardiac arrest, acute renal failure, sepsis, VDRF 5/28-5/30. Hx of cognitive impairment, bipolar, CVA in 2015. Note MBS complete in 2015 during admission for similar dx, noted severe oropharyngeal dysphagia. FEES recommended after BSE.      SLP Plan  Continue with current plan of care       Recommendations  Diet recommendations: Dysphagia 3 (mechanical soft);Nectar-thick liquid Liquids provided via: Cup;No straw Medication Administration: Whole meds with puree Supervision: Intermittent supervision to cue for compensatory strategies Compensations: Slow rate;Small sips/bites Postural Changes and/or Swallow Maneuvers: Seated upright 90 degrees                Oral Care Recommendations: Oral care BID Follow up Recommendations: Skilled Nursing facility SLP Visit Diagnosis: Dysphagia, pharyngeal phase (R13.13) Plan: Continue with current plan of care       GO                Royce MacadamiaLitaker, Chanz Cahall Willis 04/22/2017, 10:46 AM  Breck CoonsLisa Willis Lonell FaceLitaker M.Ed [employer]-SLP Pager [phone]

## 2017-04-22 NOTE — Progress Notes (Signed)
PROGRESS NOTE  [name]  RUE:[id number] DOB: 1950-12-16 DOA: 04/15/2017 PCP: Lindley Magnus, PA-C   Brief Narrative: [name] is a 66 y.o. female with a history of schizophrenia, poorly-controlled diabetes and DKA who presented to the ED after her family was unable to wake her from sleep. On arrival she was hyperglycemic, acidotic, and hypotensive, developing into PEA arrest with ROSC after about 5 minutes. She was intubated, put on pressors and insulin infusion, with eventual improvement. Sputum culture grew S. agalactiae consistent with streptococcal pneumonia, started antibiotics 5/29. She was extubated 5/30, off pressors since 5/31, and started back on basal/bolus insulin. Hospitalists assumed care 6/1. Renal failure has persisted/worsened and nephrology was consulted. Urine output has improved with albumin/lasix, though renal clearance seems to be lagging. BUN/Cr is 86/7.07.   Assessment & Plan: Principal Problem:   DKA (diabetic ketoacidoses) (HCC) Active Problems:   Cardiac arrest (HCC)   Septic shock (HCC)   Acute respiratory failure with hypoxia (HCC)   Anoxic brain injury (HCC)   Schizophrenia (HCC)   Thrombocytopenia (HCC)   Arterial hypotension   Acute respiratory acidosis   Acute renal failure (ARF) (HCC)   Acute on chronic systolic CHF (congestive heart failure) (HCC)  Acute hypoxemic respiratory failure: In setting of DKA, septic shock due to streptococcal pneumonia (S. agalactiae on sputum culture). Also suspect element of volume overload. Extubated 5/30. - Continue oxygen prn - Treat pneumonia (strep. agalactiae) with 7 days abx (ceftriaxone > cefpodoxime 6/1) - Treat volume overload with albumin/lasix. Monitor I/O, daily weights.   Acute renal failure: Suspect ATN due to PEA arrest, septic shock. Renal U/S shows 13.0 / 13.9cm kidneys with normal echogenicity and cortical thickness, no hydro ?cystitis. FENa elevated at 15%. - UOP improved (vs. more  accurate documentation after foley) with albumin/lasix. Metabolic panel not yet drawn. Will reorder.  - Appreciate nephrology assistance. No indications for HD at this time. - Abx will cover cystitis - With metabolic acidosis, on bicarbonate during AKI, not related to now resolved DKA.  Anemia: Normocytic, ferritin 918.  - Follow up CBC, consider ESA.   Acute HFrEF s/p PEA cardiac arrest: Echo 5/29: LVEF: 35-40%, diffuse hypokinesis, g1dd, increased PA peak pressures. Received IVF's for septic shock initially, now requiring diuresis. Weaned off pressors 5/31.  - Giving albumin + lasix, volume status improved - Daily weight, I/O  DKA and uncontrolled IDDM: HbA1c 14.5% (average CBG 370mg /dl).  - Titrating insulin: Decreased lantus ultimately to off and will increase SSI to moderate + HS coverage as not at inpatient goal. Monitor closely as renal failure proceeds.  - Diabetes coordinator consulted  Troponin elevation: Flat trend in setting of acute renal failure and following PEA arrest, denying chest pain today. ECG without ischemic changes.  - Monitor.  Hyperkalemia - resolved Hypokalemia - resolved  Elevated LFTs: Suspect ischemic insult during PEA arrest, mild, downtrending. Resolved 6/4  Suspected anoxic brain injury and history of cognitive impairment, schizophrenia, bipolar disorder: Sister suspects she's been off medications for a while because she forgets to take them.  - Restarted outpatient medications (abilify, cogentin, wellbutrin, remeron) 6/1, mental status improving.  DVT prophylaxis: Subcutaneous heparin Code Status: Full Family Communication: None at bedside this AM Disposition Plan: Anticipate DC to SNF once work up and treatment of renal failure and septic shock completed.  Consultants:   PCCM primary until 6/1  Nephrology 6/2  Procedures:  ETT 5/28 >> 5/30 Rt IJ CVL 5/28 >> 5/31  Antimicrobials:  Aztreonam 5/28  Vancomycin  5/28 - 5/29  Cefepime  5/28 - 5/30  Ceftriaxone 5/31   Cefpodoxime - 6/4  Subjective: Pt complaining of stable dyspnea, swelling improved. + Cough which hurts chest, otherwise no chest pain.    Objective: Vitals:   04/21/17 1653 04/21/17 2054 04/22/17 0339 04/22/17 0942  BP: (!) 107/56 (!) 109/59 118/71 (!) 116/57  Pulse: 84 79 80 84  Resp: 16 16 16 18   Temp: 98.1 F (36.7 C) 98.4 F (36.9 C) 98 F (36.7 C) 98.3 F (36.8 C)  TempSrc: Oral Oral Oral Oral  SpO2: 92% 94% 94% 95%  Weight:      Height:        Intake/Output Summary (Last 24 hours) at 04/22/17 1009 Last data filed at 04/22/17 0900  Gross per 24 hour  Intake              530 ml  Output             2720 ml  Net            -2190 ml   Filed Weights   04/19/17 0520 04/19/17 2004 04/20/17 2153  Weight: 77.4 kg (170 lb 10.2 oz) 76.7 kg (169 lb) 76.8 kg (169 lb 4.8 oz)    Examination: General exam: Unkempt female in no acute distress, ambulated in hallway with PT. Respiratory system: Non-labored breathing with supplemental oxygen. Coarse, with bibasilar crackles. Coughs with deep inspiration. Cardiovascular system: Regular rate and rhythm. II/VI holosystolic murmur. 1+ UE edema, trace pedal edema. Gastrointestinal system: Abdomen soft, non-tender, non-distended, with normoactive bowel sounds. No organomegaly or masses felt. Central nervous system: Alert, oriented to hospital, knows I'm her doctor, less confused and follows conversation more easily. No focal neurological deficits. Extremities: Warm, no deformities  Skin: No rashes, lesions no ulcers Psychiatry: Judgement and insight appear fair. Cognitive impairment, short term recall impaired. Mood & affect appropriate.   Data Reviewed: I have personally reviewed following labs and imaging studies  CBC:  Recent Labs Lab 04/15/17 1137  04/16/17 0233 04/17/17 0500 04/18/17 0425 04/19/17 0218 04/21/17 1130  WBC 14.4*  --  6.8 10.9* 6.6 6.3 5.1  NEUTROABS 11.9*  --  5.5 9.7*  --    --   --   HGB 13.7  < > 11.7* 11.2* 9.5* 9.4* 10.0*  HCT 44.7  < > 35.0* 32.5* 28.5* 27.6* 31.2*  MCV 87.8  --  79.2 77.2* 78.5 79.3 81.7  PLT 323  --  153 162 88* 93* 111*  < > = values in this interval not displayed. Basic Metabolic Panel:  Recent Labs Lab 04/15/17 1459 04/16/17 0233  04/16/17 2141 04/17/17 0500 04/18/17 0340 04/19/17 0218 04/20/17 0653 04/21/17 1130 04/22/17 0541  NA 143 137  < > 135 139 140 138 140 141 141  K 2.8* 3.2*  < > 3.6 4.4 3.8 4.1 3.6 3.4* 4.0  CL 123* 111  < > 108 110 106 105 105 107 105  CO2 <7* 14*  < > 18* 20* 22 22 19* 20* 21*  GLUCOSE 418* 364*  < > 276* 166* 145* 92 68 289* 260*  BUN 19 32*  < > 39* 43* 50* 58* 70* 79* 86*  CREATININE 1.40* 2.11*  < > 2.55* 2.81* 3.73* 4.50* 5.48* 6.50* 7.07*  CALCIUM 5.3* 6.8*  < > 7.1* 7.3* 7.5* 7.6* 8.1* 8.0* 7.9*  MG 1.3* 1.3*  --  2.6*  --  2.3  --   --   --   --  PHOS 4.2 <1.0*  --   --  1.1* 1.8*  --  2.4* 3.0  --   < > = values in this interval not displayed. GFR: Estimated Creatinine Clearance: 8.2 mL/min (A) (by C-G formula based on SCr of 7.07 mg/dL (H)). Liver Function Tests:  Recent Labs Lab 04/15/17 1137 04/17/17 0500 04/18/17 0340 04/20/17 0653 04/21/17 1130 04/22/17 0541  AST 16 156* 88*  --   --  15  ALT 15 230* 157*  --   --  35  ALKPHOS 141* 223* 189*  --   --  134*  BILITOT 1.6* 0.7 0.9  --   --  0.9  PROT 6.9 4.6* 4.7*  --   --  4.8*  ALBUMIN 3.0* 1.6* 1.7* 1.5* 1.9* 1.9*   No results for input(s): LIPASE, AMYLASE in the last 168 hours. No results for input(s): AMMONIA in the last 168 hours. Coagulation Profile: No results for input(s): INR, PROTIME in the last 168 hours. Cardiac Enzymes:  Recent Labs Lab 04/16/17 0233 04/18/17 2040 04/19/17 0218 04/19/17 1116  TROPONINI 0.18* 0.18* 0.20* 0.17*   BNP (last 3 results) No results for input(s): PROBNP in the last 8760 hours. HbA1C:  Recent Labs  04/20/17 0653  HGBA1C 14.5*   CBG:  Recent Labs Lab  04/21/17 0757 04/21/17 1149 04/21/17 1654 04/21/17 2102 04/22/17 0747  GLUCAP 203* 240* 306* 259* 262*   Lipid Profile: No results for input(s): CHOL, HDL, LDLCALC, TRIG, CHOLHDL, LDLDIRECT in the last 72 hours. Thyroid Function Tests: No results for input(s): TSH, T4TOTAL, FREET4, T3FREE, THYROIDAB in the last 72 hours. Anemia Panel:  Recent Labs  04/21/17 1130  VITAMINB12 2,158*  FOLATE 12.2  FERRITIN 918*  TIBC NOT CALCULATED  IRON 11*  RETICCTPCT 1.2   Urine analysis:    Component Value Date/Time   COLORURINE YELLOW 04/21/2017 1046   APPEARANCEUR HAZY (A) 04/21/2017 1046   LABSPEC 1.006 04/21/2017 1046   PHURINE 5.0 04/21/2017 1046   GLUCOSEU 50 (A) 04/21/2017 1046   HGBUR SMALL (A) 04/21/2017 1046   BILIRUBINUR NEGATIVE 04/21/2017 1046   BILIRUBINUR neg 05/22/2014 0935   KETONESUR NEGATIVE 04/21/2017 1046   PROTEINUR NEGATIVE 04/21/2017 1046   UROBILINOGEN 0.2 10/14/2014 0545   NITRITE NEGATIVE 04/21/2017 1046   LEUKOCYTESUR LARGE (A) 04/21/2017 1046   Recent Results (from the past 240 hour(s))  Blood Culture (routine x 2)     Status: None   Collection Time: 04/15/17 12:24 PM  Result Value Ref Range Status   Specimen Description BLOOD LEFT HAND  Final   Special Requests IN PEDIATRIC BOTTLE Blood Culture adequate volume  Final   Culture NO GROWTH 5 DAYS  Final   Report Status 04/20/2017 FINAL  Final  Urine culture     Status: None   Collection Time: 04/15/17  2:53 PM  Result Value Ref Range Status   Specimen Description URINE, CATHETERIZED  Final   Special Requests NONE  Final   Culture NO GROWTH  Final   Report Status 04/16/2017 FINAL  Final  MRSA PCR Screening     Status: None   Collection Time: 04/15/17  2:59 PM  Result Value Ref Range Status   MRSA by PCR NEGATIVE NEGATIVE Final    Comment:        The GeneXpert MRSA Assay (FDA approved for NASAL specimens only), is one component of a comprehensive MRSA colonization surveillance program. It is  not intended to diagnose MRSA infection nor to guide or monitor treatment for  MRSA infections.   Culture, blood (Routine X 2) w Reflex to ID Panel     Status: None   Collection Time: 04/15/17  7:15 PM  Result Value Ref Range Status   Specimen Description BLOOD LEFT HAND  Final   Special Requests IN PEDIATRIC BOTTLE Blood Culture adequate volume  Final   Culture NO GROWTH 5 DAYS  Final   Report Status 04/20/2017 FINAL  Final  Culture, respiratory (NON-Expectorated)     Status: None   Collection Time: 04/16/17 10:15 AM  Result Value Ref Range Status   Specimen Description TRACHEAL ASPIRATE  Final   Special Requests NONE  Final   Gram Stain   Final    DEGENERATED CELLULAR MATERIAL PRESENT ABUNDANT GRAM POSITIVE COCCI IN PAIRS    Culture   Final    ABUNDANT STREPTOCOCCUS AGALACTIAE TESTING AGAINST S. AGALACTIAE NOT ROUTINELY PERFORMED DUE TO PREDICTABILITY OF AMP/PEN/VAN SUSCEPTIBILITY.    Report Status 04/18/2017 FINAL  Final      Radiology Studies: No results found.  Scheduled Meds: . ARIPiprazole  15 mg Oral Daily  . benztropine  1 mg Oral QHS  . buPROPion  75 mg Oral BID  . cefpodoxime  200 mg Oral Q24H  . chlorhexidine  15 mL Mouth Rinse BID  . fluconazole  150 mg Oral Q72H  . furosemide  80 mg Intravenous Once  . heparin subcutaneous  5,000 Units Subcutaneous Q8H  . insulin aspart  0-9 Units Subcutaneous TID WC  . mouth rinse  15 mL Mouth Rinse q12n4p  . mirtazapine  15 mg Oral QHS  . multivitamin  15 mL Per Tube Daily  . pantoprazole  40 mg Oral QHS   Continuous Infusions:    LOS: 7 days   Time spent: 25 minutes.  Hazeline Junkeryan Rashad Obeid, MD [facility] Pager [phone]  If 7PM-7AM, please contact night-coverage [email or web address] Password [email or web address] 04/22/2017, 10:09 AM

## 2017-04-23 DIAGNOSIS — F209 Schizophrenia, unspecified: Secondary | ICD-10-CM

## 2017-04-23 LAB — RENAL FUNCTION PANEL
ANION GAP: 14 (ref 5–15)
Albumin: 2.3 g/dL — ABNORMAL LOW (ref 3.5–5.0)
BUN: 91 mg/dL — ABNORMAL HIGH (ref 6–20)
CHLORIDE: 104 mmol/L (ref 101–111)
CO2: 26 mmol/L (ref 22–32)
CREATININE: 6.73 mg/dL — AB (ref 0.44–1.00)
Calcium: 8.1 mg/dL — ABNORMAL LOW (ref 8.9–10.3)
GFR, EST AFRICAN AMERICAN: 7 mL/min — AB (ref >60)
GFR, EST NON AFRICAN AMERICAN: 6 mL/min — AB (ref >60)
Glucose, Bld: 291 mg/dL — ABNORMAL HIGH (ref 65–99)
Phosphorus: 3.9 mg/dL (ref 2.5–4.6)
Potassium: 4.1 mmol/L (ref 3.5–5.1)
Sodium: 144 mmol/L (ref 135–145)

## 2017-04-23 LAB — PROTEIN / CREATININE RATIO, URINE
CREATININE, URINE: 17.95 mg/dL
Protein Creatinine Ratio: 1.56 mg/mg{Cre} — ABNORMAL HIGH (ref 0.00–0.15)
TOTAL PROTEIN, URINE: 28 mg/dL

## 2017-04-23 LAB — GLUCOSE, CAPILLARY
GLUCOSE-CAPILLARY: 270 mg/dL — AB (ref 65–99)
GLUCOSE-CAPILLARY: 237 mg/dL — AB (ref 65–99)
Glucose-Capillary: 277 mg/dL — ABNORMAL HIGH (ref 65–99)
Glucose-Capillary: 218 mg/dL — ABNORMAL HIGH (ref 65–99)

## 2017-04-23 MED ORDER — INSULIN GLARGINE 100 UNIT/ML ~~LOC~~ SOLN
10.0000 [IU] | Freq: Every day | SUBCUTANEOUS | Status: DC
  Administered 2017-04-23 – 2017-04-24 (×2): 10 [IU] via SUBCUTANEOUS
  Filled 2017-04-23 (×2): qty 0.1

## 2017-04-23 MED ORDER — FUROSEMIDE 80 MG PO TABS
80.0000 mg | ORAL_TABLET | Freq: Two times a day (BID) | ORAL | Status: DC
  Administered 2017-04-23 – 2017-04-24 (×3): 80 mg via ORAL
  Filled 2017-04-23 (×3): qty 1

## 2017-04-23 NOTE — Care Management Note (Signed)
Case Management Note  Patient Details  Name: Abigail RutherfordDeborah A Zamora MRN: [id number] Date of Birth: Jul 05, 1951  Subjective/Objective:      CM following for progression and d/c planning.               Action/Plan: 04/23/2017 Noted plan for pt to d/c to SNF for short term rehab. No CM needs at this time.  Expected Discharge Date:    04/24/2017              Expected Discharge Plan:  Skilled Nursing Facility  In-House Referral:  Clinical Social Work  Discharge planning Services  NA  Post Acute Care Choice:  NA Choice offered to:  NA  DME Arranged:   NA DME Agency:   NA  HH Arranged:  NA HH Agency:   NA  Status of Service:  Completed, signed off  If discussed at [employer] Length of Stay Meetings, dates discussed:    Additional Comments:  Starlyn SkeansRoyal, Oluwatimileyin Vivier U, RN 04/23/2017, 3:08 PM

## 2017-04-23 NOTE — Progress Notes (Addendum)
Patient ID: [name], female   DOB: 04/14/1951, 66 y.o.   MRN: [id number]  [facility] Progress Note   Subjective:    Coughing a fair amount O2 Sats OK Great diuresis past 24 hours with marked improvement in edema  Odd comment "Nobody is helping me get better - I'm just laying here" Advised her she is getting numerous medications and interventions to help her recover - then she agreed...   Objective:   BP 128/68 (BP Location: Right Arm)   Pulse 83   Temp 98.3 F (36.8 C) (Oral)   Resp 18   Ht 5\' 5"  (1.651 m)   Wt 76.8 kg (169 lb 4.8 oz)   SpO2 93%   BMI 28.17 kg/m   Intake/Output Summary (Last 24 hours) at 04/23/17 0821 Last data filed at 04/23/17 0648  Gross per 24 hour  Intake              457 ml  Output             4350 ml  Net            -3893 ml   Weight change:   Physical Exam: Lying in bed, NAD but coughing some No JVD Lungs with diminished BS bases, no def rales Tachy S1S2 No S3 2/6 holosystolic murmur USB Abdomen soft and not tender with + BS 1+ edema LE's MUCH improved. Minimal UE edema now   Recent Labs Lab 04/16/17 2141 04/17/17 0500 04/18/17 0340 04/19/17 0218 04/20/17 0653 04/21/17 1130 04/22/17 0541  NA 135 139 140 138 140 141 141  K 3.6 4.4 3.8 4.1 3.6 3.4* 4.0  CL 108 110 106 105 105 107 105  CO2 18* 20* 22 22 19* 20* 21*  GLUCOSE 276* 166* 145* 92 68 289* 260*  BUN 39* 43* 50* 58* 70* 79* 86*  CREATININE 2.55* 2.81* 3.73* 4.50* 5.48* 6.50* 7.07*  CALCIUM 7.1* 7.3* 7.5* 7.6* 8.1* 8.0* 7.9*  PHOS  --  1.1* 1.8*  --  2.4* 3.0  --    Iron/TIBC/Ferritin/ %Sat    Component Value Date/Time   IRON 11 (L) 04/21/2017 1130   TIBC NOT CALCULATED 04/21/2017 1130   FERRITIN 918 (H) 04/21/2017 1130   IRONPCTSAT NOT CALCULATED 04/21/2017 1130    Recent Labs Lab 04/17/17 0500 04/18/17 0425 04/19/17 0218 04/21/17 1130  WBC 10.9* 6.6 6.3 5.1  NEUTROABS 9.7*  --   --   --   HGB 11.2* 9.5* 9.4* 10.0*  HCT 32.5*  28.5* 27.6* 31.2*  MCV 77.2* 78.5 79.3 81.7  PLT 162 88* 93* 111*    Medications:    . ARIPiprazole  15 mg Oral Daily  . benztropine  1 mg Oral QHS  . buPROPion  75 mg Oral BID  . chlorhexidine  15 mL Mouth Rinse BID  . fluconazole  150 mg Oral Q72H  . heparin subcutaneous  5,000 Units Subcutaneous Q8H  . insulin aspart  0-15 Units Subcutaneous TID WC  . insulin aspart  0-5 Units Subcutaneous QHS  . mouth rinse  15 mL Mouth Rinse q12n4p  . mirtazapine  15 mg Oral QHS  . multivitamin  15 mL Per Tube Daily  . pantoprazole  40 mg Oral QHS   . ferumoxytol Stopped (04/22/17 1747)   Background: 66 yo female. PMH DM, HTN, CKD 3 (BL creatinine 1.7-2). Admitted w/DKA, had PEA arrest, UTI w/poss septic shock. Creatinine on admission 2.34 (5/28) and continued to rise. Renal consulted 6/2  when creatinine 5.48, marginal UOP. (Of note had similar episode 09/2014 and required CRRT that admission). Has not required RRT this admission  Assessment/ Plan:     1. AKI on CKD3 - hemodynamically mediated AKI 2/2 DKA/PEA arrest in pt with no renal reserve (AKI 2015 requiring CRRT) Urine electrolytes pointed to ATN. EXCELLENT UOP response to lasix 80 mg Q12H + albumin and vol excess markedly better today. No labs back this AM. No weight since 6/2. Ordered.  I think could change to po diuretics. No overt HD indications but again, labs not back yet. 2. Anion gap metabolic acidosis: 2/2 AKI. Required bicarb drip earlier this admit. It appears never started on oral bicarb. Trending labs, start po if CO2 <20.  3. Diabetic ketoacidosis - resolved 4. Status post PEA arrest  5. Acute sCHF. LV EF 35% - 40% on ECHO 04/16/17. Good UOP with lasix and albumin. I think can change to po lasix 80 BID   6. Anemia: Chronic ds + Fe def (Fe 11, unable to calc tsat).  Dosed Feraheme 510 IV x 2 doses, 1 week apart.   Camille Balynthia Shyhiem Beeney, MD [facility] [phone] Pager 04/23/2017, 8:27 AM   Addendum:   Recent  Labs  04/21/17 1130 04/22/17 0541 04/23/17 0753  NA 141 141 144  K 3.4* 4.0 4.1  CL 107 105 104  CO2 20* 21* 26  GLUCOSE 289* 260* 291*  BUN 79* 86* 91*  CREATININE 6.50* 7.07* 6.73*  CALCIUM 8.0* 7.9* 8.1*  PHOS 3.0  --  3.9   Creatinine has peaked and is starting to trend down K and acid base fine No change in plans as outlined  Camille Balynthia Saamiya Jeppsen, MD [facility] [phone] Pager 04/23/2017, 10:18 AM

## 2017-04-23 NOTE — Progress Notes (Signed)
Nutrition Follow-up  DOCUMENTATION CODES:   Not applicable  INTERVENTION:   -Recommend MightyShake II BID, Magic Cup at [employer]  NUTRITION DIAGNOSIS:   Inadequate oral intake related to inability to eat as evidenced by NPO status.  Improving as diet advanced post extubation, eating 50% of meals, adding supplements  GOAL:   Patient will meet greater than or equal to 90% of their needs  Progressing  MONITOR:   Vent status, I & O's, TF tolerance, Labs  REASON FOR ASSESSMENT:   Consult Enteral/tube feeding initiation and management  ASSESSMENT:   Pt with hx of DM and schizophrenia admitted with DKA, PEA cardiac arrest, ARF, and sepsis.   Extubated 5/30, diet advanced to Dysphagia III with Nectar Liquids on 6/01.   Pt reports appetite improving, pt eating mostly 50% of meal trays per I/O flow sheet.  Renal function improving, UOP >4 L in 24 hours  Labs: Creatinine 6.73 (improving), CBGs 229-270 Meds: lasix, remeron, MVI  Diet Order:  DIET DYS 3 Room service appropriate? Yes; Fluid consistency: Nectar Thick  Skin:  Reviewed, no issues  Last BM:  6/5  Height:   Ht Readings from Last 1 Encounters:  04/18/17 5\' 5"  (1.651 m)    Weight:   Wt Readings from Last 1 Encounters:  04/20/17 169 lb 4.8 oz (76.8 kg)    Ideal Body Weight:  61.3 kg  BMI:  Body mass index is 28.17 kg/m.  Estimated Nutritional Needs:   Kcal:  1500-1700  Protein:  80-100 grams  Fluid:  > 1.5 L/day  EDUCATION NEEDS:   No education needs identified at this time  Romelle StarcherCate Kavya Haag MS, RD, LDN [phone] Pager  [phone] Weekend/On-Call Pager

## 2017-04-23 NOTE — Progress Notes (Signed)
PROGRESS NOTE  [name]  XLK:[id number] DOB: 1950/12/20 DOA: 04/15/2017 PCP: Lindley Magnus, PA-C   Brief Narrative: [name] is a 66 y.o. female with a history of schizophrenia, poorly-controlled diabetes and DKA who presented to the ED after her family was unable to wake her from sleep. On arrival she was hyperglycemic, acidotic, and hypotensive, developing into PEA arrest with ROSC after about 5 minutes. She was intubated, put on pressors and insulin infusion, with eventual improvement. Sputum culture grew S. agalactiae consistent with streptococcal pneumonia, started antibiotics 5/29. She was extubated 5/30, off pressors since 5/31, and started back on basal/bolus insulin. Hospitalists assumed care 6/1. Renal failure has persisted/worsened and nephrology was consulted. Urine output has improved with albumin/lasix, and creatinine stopped rising 6/5.    Assessment & Plan: Principal Problem:   DKA (diabetic ketoacidoses) (HCC) Active Problems:   Cardiac arrest (HCC)   Septic shock (HCC)   Acute respiratory failure with hypoxia (HCC)   Anoxic brain injury (HCC)   Schizophrenia (HCC)   Thrombocytopenia (HCC)   Arterial hypotension   Acute respiratory acidosis   Acute renal failure (ARF) (HCC)   Acute on chronic systolic CHF (congestive heart failure) (HCC)  Acute hypoxemic respiratory failure: In setting of DKA, septic shock due to streptococcal pneumonia (S. agalactiae on sputum culture). Also suspect element of volume overload. Extubated 5/30. - Weaned from oxygen. Resolved.  - Treated pneumonia (strep. agalactiae) with 7 days abx (ceftriaxone > cefpodoxime) - Treat volume overload with albumin/lasix. Monitor I/O, daily weights.   Acute renal failure: Suspect ATN due to PEA arrest, septic shock, in pt with diminished reserve (required CRRT in similar setting in the past). Renal U/S shows 13.0 / 13.9cm kidneys with normal echogenicity and cortical thickness, no hydro.  FENa elevated at 15%. - UOP significantly improve with albumin/lasix. Creatinine showing signs of improvement today. Hopeful for continued improvement. BUN still rising, without signs/symptoms of uremia. Transition lasix to po today per nephrology, still with bibasilar crackles.  - With metabolic acidosis, mild, no bicarb unless < 20 per nephrology.   Anemia: Normocytic, ferritin 918.  - Follow up CBC, consider ESA.   Acute HFrEF s/p PEA cardiac arrest: Echo 5/29: LVEF: 35-40%, diffuse hypokinesis, g1dd, increased PA peak pressures. Received IVF's for septic shock initially, now requiring diuresis. Weaned off pressors 5/31.  - Volume status improved. - Daily weight, I/O  DKA and uncontrolled IDDM: HbA1c 14.5% (average CBG 370mg /dl).  - Titrating insulin: Restart lantus today. Continue SSI. Titrate lantus tomorrow based on short-acting needs. - Diabetes coordinator consulted  Troponin elevation: Flat trend in setting of acute renal failure and following PEA arrest, denying chest pain today. ECG without ischemic changes.  - Monitor.  Hyperkalemia - resolved Hypokalemia - resolved  Elevated LFTs: Suspect ischemic insult during PEA arrest, mild, downtrending. Resolved 6/4  Suspected anoxic brain injury and history of cognitive impairment, schizophrenia, bipolar disorder: Sister suspects she's been off medications for a while because she forgets to take them.  - Restarted outpatient medications (abilify, cogentin, wellbutrin, remeron) 6/1, mental status improving.  DVT prophylaxis: Subcutaneous heparin Code Status: Full Family Communication: None at bedside this AM Disposition Plan: Anticipate DC to SNF once work up and treatment of renal failure and septic shock completed. Maybe 6/7.  Consultants:   PCCM primary until 6/1  Nephrology 6/2  Procedures:  ETT 5/28 >> 5/30 Rt IJ CVL 5/28 >> 5/31  Antimicrobials:  Aztreonam 5/28  Vancomycin 5/28 - 5/29  Cefepime 5/28 -  5/30  Ceftriaxone 5/31   Cefpodoxime - 6/4  Subjective: Pt feels and appears much better today.Swelling better, mentating well, still with dyspnea on exertion but improving. Strength improving with PT.  Objective: Vitals:   04/22/17 1752 04/22/17 2100 04/23/17 0648 04/23/17 1032  BP: 116/67 118/70 128/68 (!) 106/58  Pulse: 90 80 83 78  Resp: 17 17 18 18   Temp: 98 F (36.7 C) 98.2 F (36.8 C) 98.3 F (36.8 C) 98.3 F (36.8 C)  TempSrc: Oral Oral Oral Oral  SpO2: 94% 95% 93% 94%  Weight:      Height:        Intake/Output Summary (Last 24 hours) at 04/23/17 1107 Last data filed at 04/23/17 1015  Gross per 24 hour  Intake              697 ml  Output             3950 ml  Net            -3253 ml   Filed Weights   04/19/17 0520 04/19/17 2004 04/20/17 2153  Weight: 77.4 kg (170 lb 10.2 oz) 76.7 kg (169 lb) 76.8 kg (169 lb 4.8 oz)    Examination: General exam: female in no acute distress Respiratory system: Non-labored breathing with supplemental oxygen. Coarse, with bibasilar crackles. Coughs with deep inspiration. Cardiovascular system: Regular rate and rhythm. II/VI holosystolic murmur. 1+ UE edema, trace pedal edema. Gastrointestinal system: Abdomen soft, non-tender, non-distended, with normoactive bowel sounds. No organomegaly or masses felt. Central nervous system: Alert, oriented x3, recognizes me now. Conversant with no obvious cognitive deficit. No focal neurological deficits. Extremities: Warm, no deformities  Skin: No rashes, lesions no ulcers Psychiatry: Judgement and insight appear improved, intact. Mood & affect appropriate.   Data Reviewed: I have personally reviewed following labs and imaging studies  CBC:  Recent Labs Lab 04/17/17 0500 04/18/17 0425 04/19/17 0218 04/21/17 1130  WBC 10.9* 6.6 6.3 5.1  NEUTROABS 9.7*  --   --   --   HGB 11.2* 9.5* 9.4* 10.0*  HCT 32.5* 28.5* 27.6* 31.2*  MCV 77.2* 78.5 79.3 81.7  PLT 162 88* 93* 111*   Basic  Metabolic Panel:  Recent Labs Lab 04/16/17 2141 04/17/17 0500 04/18/17 0340 04/19/17 0218 04/20/17 0653 04/21/17 1130 04/22/17 0541 04/23/17 0753  NA 135 139 140 138 140 141 141 144  K 3.6 4.4 3.8 4.1 3.6 3.4* 4.0 4.1  CL 108 110 106 105 105 107 105 104  CO2 18* 20* 22 22 19* 20* 21* 26  GLUCOSE 276* 166* 145* 92 68 289* 260* 291*  BUN 39* 43* 50* 58* 70* 79* 86* 91*  CREATININE 2.55* 2.81* 3.73* 4.50* 5.48* 6.50* 7.07* 6.73*  CALCIUM 7.1* 7.3* 7.5* 7.6* 8.1* 8.0* 7.9* 8.1*  MG 2.6*  --  2.3  --   --   --   --   --   PHOS  --  1.1* 1.8*  --  2.4* 3.0  --  3.9   GFR: Estimated Creatinine Clearance: 8.7 mL/min (A) (by C-G formula based on SCr of 6.73 mg/dL (H)). Liver Function Tests:  Recent Labs Lab 04/17/17 0500 04/18/17 0340 04/20/17 0653 04/21/17 1130 04/22/17 0541 04/23/17 0753  AST 156* 88*  --   --  15  --   ALT 230* 157*  --   --  35  --   ALKPHOS 223* 189*  --   --  134*  --   BILITOT 0.7 0.9  --   --  0.9  --   PROT 4.6* 4.7*  --   --  4.8*  --   ALBUMIN 1.6* 1.7* 1.5* 1.9* 1.9* 2.3*   No results for input(s): LIPASE, AMYLASE in the last 168 hours. No results for input(s): AMMONIA in the last 168 hours. Coagulation Profile: No results for input(s): INR, PROTIME in the last 168 hours. Cardiac Enzymes:  Recent Labs Lab 04/18/17 2040 04/19/17 0218 04/19/17 1116  TROPONINI 0.18* 0.20* 0.17*   BNP (last 3 results) No results for input(s): PROBNP in the last 8760 hours. HbA1C: No results for input(s): HGBA1C in the last 72 hours. CBG:  Recent Labs Lab 04/22/17 0747 04/22/17 1204 04/22/17 1655 04/22/17 2124 04/23/17 0748  GLUCAP 262* 254* 241* 229* 270*   Lipid Profile: No results for input(s): CHOL, HDL, LDLCALC, TRIG, CHOLHDL, LDLDIRECT in the last 72 hours. Thyroid Function Tests: No results for input(s): TSH, T4TOTAL, FREET4, T3FREE, THYROIDAB in the last 72 hours. Anemia Panel:  Recent Labs  04/21/17 1130  VITAMINB12 2,158*  FOLATE  12.2  FERRITIN 918*  TIBC NOT CALCULATED  IRON 11*  RETICCTPCT 1.2   Urine analysis:    Component Value Date/Time   COLORURINE YELLOW 04/21/2017 1046   APPEARANCEUR HAZY (A) 04/21/2017 1046   LABSPEC 1.006 04/21/2017 1046   PHURINE 5.0 04/21/2017 1046   GLUCOSEU 50 (A) 04/21/2017 1046   HGBUR SMALL (A) 04/21/2017 1046   BILIRUBINUR NEGATIVE 04/21/2017 1046   BILIRUBINUR neg 05/22/2014 0935   KETONESUR NEGATIVE 04/21/2017 1046   PROTEINUR NEGATIVE 04/21/2017 1046   UROBILINOGEN 0.2 10/14/2014 0545   NITRITE NEGATIVE 04/21/2017 1046   LEUKOCYTESUR LARGE (A) 04/21/2017 1046   Recent Results (from the past 240 hour(s))  Blood Culture (routine x 2)     Status: None   Collection Time: 04/15/17 12:24 PM  Result Value Ref Range Status   Specimen Description BLOOD LEFT HAND  Final   Special Requests IN PEDIATRIC BOTTLE Blood Culture adequate volume  Final   Culture NO GROWTH 5 DAYS  Final   Report Status 04/20/2017 FINAL  Final  Urine culture     Status: None   Collection Time: 04/15/17  2:53 PM  Result Value Ref Range Status   Specimen Description URINE, CATHETERIZED  Final   Special Requests NONE  Final   Culture NO GROWTH  Final   Report Status 04/16/2017 FINAL  Final  MRSA PCR Screening     Status: None   Collection Time: 04/15/17  2:59 PM  Result Value Ref Range Status   MRSA by PCR NEGATIVE NEGATIVE Final    Comment:        The GeneXpert MRSA Assay (FDA approved for NASAL specimens only), is one component of a comprehensive MRSA colonization surveillance program. It is not intended to diagnose MRSA infection nor to guide or monitor treatment for MRSA infections.   Culture, blood (Routine X 2) w Reflex to ID Panel     Status: None   Collection Time: 04/15/17  7:15 PM  Result Value Ref Range Status   Specimen Description BLOOD LEFT HAND  Final   Special Requests IN PEDIATRIC BOTTLE Blood Culture adequate volume  Final   Culture NO GROWTH 5 DAYS  Final   Report  Status 04/20/2017 FINAL  Final  Culture, respiratory (NON-Expectorated)     Status: None   Collection Time: 04/16/17 10:15 AM  Result Value Ref Range Status   Specimen Description TRACHEAL ASPIRATE  Final   Special Requests NONE  Final  Gram Stain   Final    DEGENERATED CELLULAR MATERIAL PRESENT ABUNDANT GRAM POSITIVE COCCI IN PAIRS    Culture   Final    ABUNDANT STREPTOCOCCUS AGALACTIAE TESTING AGAINST S. AGALACTIAE NOT ROUTINELY PERFORMED DUE TO PREDICTABILITY OF AMP/PEN/VAN SUSCEPTIBILITY.    Report Status 04/18/2017 FINAL  Final      Radiology Studies: No results found.  Scheduled Meds: . ARIPiprazole  15 mg Oral Daily  . benztropine  1 mg Oral QHS  . buPROPion  75 mg Oral BID  . chlorhexidine  15 mL Mouth Rinse BID  . fluconazole  150 mg Oral Q72H  . furosemide  80 mg Oral BID  . heparin subcutaneous  5,000 Units Subcutaneous Q8H  . insulin aspart  0-15 Units Subcutaneous TID WC  . insulin aspart  0-5 Units Subcutaneous QHS  . mouth rinse  15 mL Mouth Rinse q12n4p  . mirtazapine  15 mg Oral QHS  . multivitamin  15 mL Per Tube Daily  . pantoprazole  40 mg Oral QHS   Continuous Infusions: . ferumoxytol Stopped (04/22/17 1747)     LOS: 8 days   Time spent: 25 minutes.  Hazeline Junker, MD [facility] Pager [phone]  If 7PM-7AM, please contact night-coverage [email or web address] Password [email or web address] 04/23/2017, 11:07 AM

## 2017-04-24 DIAGNOSIS — E1110 Type 2 diabetes mellitus with ketoacidosis without coma: Secondary | ICD-10-CM

## 2017-04-24 LAB — GLUCOSE, CAPILLARY
GLUCOSE-CAPILLARY: 189 mg/dL — AB (ref 65–99)
GLUCOSE-CAPILLARY: 283 mg/dL — AB (ref 65–99)
Glucose-Capillary: 260 mg/dL — ABNORMAL HIGH (ref 65–99)
Glucose-Capillary: 242 mg/dL — ABNORMAL HIGH (ref 65–99)

## 2017-04-24 LAB — CBC
HEMATOCRIT: 31 % — AB (ref 36.0–46.0)
HEMOGLOBIN: 9.6 g/dL — AB (ref 12.0–15.0)
MCH: 25.9 pg — AB (ref 26.0–34.0)
MCHC: 31 g/dL (ref 30.0–36.0)
MCV: 83.8 fL (ref 78.0–100.0)
Platelets: 173 10*3/uL (ref 150–400)
RBC: 3.7 MIL/uL — ABNORMAL LOW (ref 3.87–5.11)
RDW: 17.1 % — AB (ref 11.5–15.5)
WBC: 8.9 10*3/uL (ref 4.0–10.5)

## 2017-04-24 LAB — RENAL FUNCTION PANEL
ALBUMIN: 2.3 g/dL — AB (ref 3.5–5.0)
ANION GAP: 16 — AB (ref 5–15)
BUN: 88 mg/dL — AB (ref 6–20)
CHLORIDE: 102 mmol/L (ref 101–111)
CO2: 27 mmol/L (ref 22–32)
Calcium: 8.3 mg/dL — ABNORMAL LOW (ref 8.9–10.3)
Creatinine, Ser: 5.8 mg/dL — ABNORMAL HIGH (ref 0.44–1.00)
GFR calc Af Amer: 8 mL/min — ABNORMAL LOW (ref >60)
GFR calc non Af Amer: 7 mL/min — ABNORMAL LOW (ref >60)
GLUCOSE: 178 mg/dL — AB (ref 65–99)
PHOSPHORUS: 4.5 mg/dL (ref 2.5–4.6)
POTASSIUM: 3.7 mmol/L (ref 3.5–5.1)
Sodium: 145 mmol/L (ref 135–145)

## 2017-04-24 MED ORDER — INSULIN GLARGINE 100 UNIT/ML ~~LOC~~ SOLN
15.0000 [IU] | Freq: Every day | SUBCUTANEOUS | Status: DC
  Administered 2017-04-25: 15 [IU] via SUBCUTANEOUS
  Filled 2017-04-24: qty 0.15

## 2017-04-24 MED ORDER — FUROSEMIDE 40 MG PO TABS
40.0000 mg | ORAL_TABLET | Freq: Two times a day (BID) | ORAL | Status: DC
  Administered 2017-04-24 – 2017-04-25 (×2): 40 mg via ORAL
  Filled 2017-04-24 (×2): qty 1

## 2017-04-24 MED ORDER — DARBEPOETIN ALFA 60 MCG/0.3ML IJ SOSY
60.0000 ug | PREFILLED_SYRINGE | INTRAMUSCULAR | Status: DC
  Administered 2017-04-24: 60 ug via SUBCUTANEOUS
  Filled 2017-04-24 (×2): qty 0.3

## 2017-04-24 NOTE — Clinical Social Work Note (Signed)
CSW talked with patient's sister, Woody SellerCarol Johnson ([phone]) regarding discharge disposition and facility preference. Preference is [facility] and Ms. Johnson informed that facility declined. [facility]. [facility]'s request [facility] spoke with [facility] staff and was advised that they do not have any rehab beds available at this time. [facility] and [facility] can [facility]. Sister contacted and updated.  Talked with attending MD at 3:04 regarding discharge and was advised that he has consulted with nephrology and patient will discharge in 1-2 days. Ms. Laural BenesJohnson contacted and updated. CSW will continue to monitor patient's progress and assist with discharge to a skilled facility when medically stable.  Genelle BalVanessa Parisha Beaulac, MSW, LCSW Licensed Clinical Social Worker Clinical Social Work Department [facility] [phone]

## 2017-04-24 NOTE — Progress Notes (Signed)
Patient ID: [name], female   DOB: Jan 18, 1951, 66 y.o.   MRN: [id number]  [facility] Progress Note   Subjective:    Continues with excellent diuresis and creatinine continuing to trend down No complaint this AM Weight down 8 kg/5 days    Objective:    BP (!) 106/46 (BP Location: Right Arm)   Pulse 96   Temp 98.4 F (36.9 C) (Oral)   Resp 16   Ht 5\' 5"  (1.651 m)   Wt 69.4 kg (153 lb)   SpO2 94%   BMI 25.46 kg/m   Intake/Output Summary (Last 24 hours) at 04/24/17 1143 Last data filed at 04/24/17 1013  Gross per 24 hour  Intake              900 ml  Output             4950 ml  Net            -4050 ml   Weight change:   Physical Exam: Lying in bed, NAD No JVD Lungs with diminished BS bases,no crackles Tachy S1S2 No S3 2/6 holosystolic murmur USB Abdomen soft and not tender with + BS 1+ edema LE's MUCH improved.  Minimal UE edema    Recent Labs Lab 04/18/17 0340 04/19/17 0218 04/20/17 0653 04/21/17 1130 04/22/17 0541 04/23/17 0753 04/24/17 0618  NA 140 138 140 141 141 144 145  K 3.8 4.1 3.6 3.4* 4.0 4.1 3.7  CL 106 105 105 107 105 104 102  CO2 22 22 19* 20* 21* 26 27  GLUCOSE 145* 92 68 289* 260* 291* 178*  BUN 50* 58* 70* 79* 86* 91* 88*  CREATININE 3.73* 4.50* 5.48* 6.50* 7.07* 6.73* 5.80*  CALCIUM 7.5* 7.6* 8.1* 8.0* 7.9* 8.1* 8.3*  PHOS 1.8*  --  2.4* 3.0  --  3.9 4.5      Component Value Date/Time   IRON 11 (L) 04/21/2017 1130   TIBC NOT CALCULATED 04/21/2017 1130   FERRITIN 918 (H) 04/21/2017 1130   IRONPCTSAT NOT CALCULATED 04/21/2017 1130       Recent Labs Lab 04/18/17 0425 04/19/17 0218 04/21/17 1130 04/24/17 0618  WBC 6.6 6.3 5.1 8.9  HGB 9.5* 9.4* 10.0* 9.6*  HCT 28.5* 27.6* 31.2* 31.0*  MCV 78.5 79.3 81.7 83.8  PLT 88* 93* 111* 173      Medications:     . ARIPiprazole  15 mg Oral Daily  . benztropine  1 mg Oral QHS  . buPROPion  75 mg Oral BID  . chlorhexidine  15 mL  Mouth Rinse BID  . furosemide  80 mg Oral BID  . heparin subcutaneous  5,000 Units Subcutaneous Q8H  . insulin aspart  0-15 Units Subcutaneous TID WC  . insulin aspart  0-5 Units Subcutaneous QHS  . [START ON 04/25/2017] insulin glargine  15 Units Subcutaneous Daily  . mouth rinse  15 mL Mouth Rinse q12n4p  . mirtazapine  15 mg Oral QHS  . multivitamin  15 mL Per Tube Daily  . pantoprazole  40 mg Oral QHS   . ferumoxytol Stopped (04/22/17 1747)    Background: 66 yo female. PMH DM, HTN, CKD 3 (BL creatinine 1.7-2). Admitted w/DKA, had PEA arrest, UTI w/poss septic shock. Creatinine on admission 2.34 (5/28) and continued to rise. Renal consulted 6/2 when creatinine 5.48, marginal UOP. (Of note had similar episode 09/2014 and required CRRT that admission). Has not required RRT this admission  Assessment/ Plan:  1. AKI on CKD3 - hemodynamically mediated AKI 2/2 DKA/PEA arrest in pt with no renal reserve (AKI 2015 requiring CRRT) Urine electrolytes pointed to ATN. Now in recovery phase. Excellent UOP on 80 BID lasix po (4-5 liters/day). Creatinine continuing to trend down. Lytes OK. Probably reasonable to cut lasix back to 40 BID and continue to trend labs and UOP. Will see how much recovery she gets - baseline prior to current events 1.7-2.  2. Anion gap metabolic acidosis:2/2 AKI. Required bicarb drip earlier this admit. Normal bicarb at this time without replacement. 3. DKA - resolved. Initial A1C >14. Primary team titrating Lantus 4. Status post PEA arrest. Flat troponings.  5. Acute sCHF. LV EF 35% - 40% on ECHO 04/16/17. Excellent UOP on po lasix 6. Anemia:Chronic ds + Fe def (Fe 11, unable to calc tsat).  Dosing Feraheme 510 IV x 2 doses. Dosed 6/4. Aranesp 60 mcg today for Hb just under 10.  7. Acute hypoxemic resp failure 2/2 strep pneumonia and vol xs - resolved. ATB's done  Camille Balynthia Yalda Herd, MD [facility] [phone] Pager 04/24/2017, 11:43 AM

## 2017-04-24 NOTE — Progress Notes (Signed)
Inpatient Diabetes Program Recommendations  AACE/ADA: New Consensus Statement on Inpatient Glycemic Control (2015)  Target Ranges:  Prepandial:   less than 140 mg/dL      Peak postprandial:   less than 180 mg/dL (1-2 hours)      Critically ill patients:  140 - 180 mg/dL   Review of Glycemic Control  Diabetes history: DM 2 Current orders for Inpatient glycemic control: Lantus 10 units, Novolog Moderate 0-15 units + Novolog HS scale  Inpatient Diabetes Program Recommendations:    Glucose trend in the 200's. Patient received a total of 31 units of Novolog in the past 24 hours. Consider increasing Lantus to 15 units (On 20 units patient had very mild hypo in the high 60's)  Thanks,  Christena DeemShannon Kallin Henk RN, MSN, [facility] Inpatient Diabetes Coordinator Team Pager [phone] (8a-5p)

## 2017-04-24 NOTE — Progress Notes (Signed)
Physical Therapy Treatment Patient Details Name: [name] MRN: [id number] DOB: [birth year] Today's Date: 04/24/2017    History of Present Illness pt is a 66 yo female admitted through the ED on 04/15/17 who presents after feeling bad for several weeks with increased urine urgency. PEA, VDRF (extubated 5/30), pneumonia as well; Pt has a history of non-compliance with medications and was diagnosed with DKA. PMH significant for DM2 and schizophrenia.     PT Comments    Pt very fatigued today with O2 sats dropping to 87% on RA and inability to ambulate more than 10'. Os sats rose to 91% on 1 L O2. Continue to recommend SNF for d/c. PT will continue to follow.    Follow Up Recommendations  SNF;Supervision/Assistance - 24 hour     Equipment Recommendations  None recommended by PT    Recommendations for Other Services       Precautions / Restrictions Precautions Precautions: Fall Restrictions Weight Bearing Restrictions: No    Mobility  Bed Mobility Overal bed mobility: Needs Assistance Bed Mobility: Supine to Sit     Supine to sit: Min assist     General bed mobility comments: pt tried to sit straight up in bed but chest soreness prevented her from achieving upright posture. Had her lie back down and roll first and then sit up. Heavy use of rail and min A for last 10%.   Transfers Overall transfer level: Needs assistance Equipment used: Rolling walker (2 wheeled) Transfers: Sit to/from Stand Sit to Stand: Min assist         General transfer comment: vc's for hand placement, min A for support  Ambulation/Gait Ambulation/Gait assistance: Min assist Ambulation Distance (Feet): 10 Feet Assistive device: Rolling walker (2 wheeled) Gait Pattern/deviations: Step-through pattern;Decreased stride length;Trunk flexed Gait velocity: decreased Gait velocity interpretation: <1.8 ft/sec, indicative of risk for recurrent falls General Gait Details: pt did not feel she  could keep walking after 10' and had to sit. Gave her 3 mins rest and encouraged her to walk again but she did not feel that she could. O2 sats 87% on RA. 1L O2 reapplied, sats 91%.    Stairs            Wheelchair Mobility    Modified Rankin (Stroke Patients Only)       Balance Overall balance assessment: Needs assistance Sitting-balance support: No upper extremity supported;Feet supported Sitting balance-Leahy Scale: Fair     Standing balance support: Bilateral upper extremity supported;During functional activity Standing balance-Leahy Scale: Poor Standing balance comment: reliant on RW for stability in standing                            Cognition Arousal/Alertness: Awake/alert Behavior During Therapy: WFL for tasks assessed/performed Overall Cognitive Status: Within Functional Limits for tasks assessed                                 General Comments: pt generally prefers to not get out of bed      Exercises      General Comments        Pertinent Vitals/Pain Pain Assessment: Faces Faces Pain Scale: Hurts even more Pain Location: chest Pain Descriptors / Indicators: Aching Pain Intervention(s): Limited activity within patient's tolerance;Monitored during session    Home Living  Prior Function            PT Goals (current goals can now be found in the care plan section) Acute Rehab PT Goals Patient Stated Goal: did not state PT Goal Formulation: With patient Time For Goal Achievement: 05/03/17 Potential to Achieve Goals: Fair Progress towards PT goals: Progressing toward goals    Frequency    Min 2X/week      PT Plan Current plan remains appropriate    Zamora-evaluation              AM-PAC PT "6 Clicks" Daily Activity  Outcome Measure  Difficulty turning over in bed (including adjusting bedclothes, sheets and blankets)?: Total Difficulty moving from lying on back to sitting on the  side of the bed? : Total Difficulty sitting down on and standing up from a chair with arms (e.g., wheelchair, bedside commode, etc,.)?: Total Help needed moving to and from a bed to chair (including a wheelchair)?: A Lot Help needed walking in hospital room?: A Lot Help needed climbing 3-5 steps with a railing? : A Lot 6 Click Score: 9    End of Session Equipment Utilized During Treatment: Gait belt;Oxygen Activity Tolerance: Patient limited by fatigue;Patient limited by pain Patient left: in chair;with call bell/phone within reach;with chair alarm set Nurse Communication: Mobility status;Other (comment) (O2 requirement) PT Visit Diagnosis: Unsteadiness on feet (R26.81);Difficulty in walking, not elsewhere classified (R26.2);Pain;Muscle weakness (generalized) (M62.81) Pain - part of body:  (chest)     Time: [id number] PT Time Calculation (min) (ACUTE ONLY): 16 min  Charges:  $Gait Training: 8-22 mins                    G Codes:       Abigail CoVictoria Jamile Zamora, PT  Acute Rehab Services  [phone]    Abigail ChambersVictoria L Donterrius Zamora 04/24/2017, 2:17 PM

## 2017-04-24 NOTE — Progress Notes (Signed)
PROGRESS NOTE  [name]  ZOX:[id number] DOB: Feb 13, 1951 DOA: 04/15/2017 PCP: Lindley Magnus, PA-C   Brief Narrative: [name] is a 66 y.o. female with a history of schizophrenia, poorly-controlled diabetes and DKA who presented to the ED after her family was unable to wake her from sleep. On arrival she was hyperglycemic, acidotic, and hypotensive, developing into PEA arrest with ROSC after about 5 minutes. She was intubated, put on pressors and insulin infusion, with eventual improvement. Sputum culture grew S. agalactiae consistent with streptococcal pneumonia, started antibiotics 5/29. She was extubated 5/30, off pressors since 5/31, and started back on basal/bolus insulin. Hospitalists assumed care 6/1. With progressive rise in creatinine, nephrology was consulted. Creatinine leaked on 6/4 and has been showing signs of renal recovery with diuretics which are being weaned. She remains hypoxemic when ambulating.   Assessment & Plan: Principal Problem:   DKA (diabetic ketoacidoses) (HCC) Active Problems:   Cardiac arrest (HCC)   Septic shock (HCC)   Acute respiratory failure with hypoxia (HCC)   Anoxic brain injury (HCC)   Schizophrenia (HCC)   Thrombocytopenia (HCC)   Arterial hypotension   Acute respiratory acidosis   Acute renal failure (ARF) (HCC)   Acute on chronic systolic CHF (congestive heart failure) (HCC)  Acute hypoxemic respiratory failure: In setting of DKA, septic shock due to streptococcal pneumonia (S. agalactiae on sputum culture). Also suspect element of volume overload. Extubated 5/30. - Weaned from oxygen at rest, still needing 1L on exertion. - Treated pneumonia (strep. agalactiae) with 7 days abx (ceftriaxone > cefpodoxime) - Treat volume overload with albumin/lasix. Monitor I/O, daily weights.   Acute renal failure and stage III CKD: Suspect ATN due to PEA arrest, septic shock, in pt with diminished reserve (required CRRT in similar setting in  the past). Renal U/S shows 13.0 / 13.9cm kidneys with normal echogenicity and cortical thickness, no hydro. FENa elevated at 15%. Creatinine peak 7.07 on 6/4. Presumptive baseline 1.7-2.  - Deescalating po lasix per nephrology: 40mg  po BID.  - With metabolic acidosis, mild, no bicarb unless < 20 per nephrology.  - Would benefit from outpatient nephrology follow up.   Acute HFrEF s/p PEA cardiac arrest: Echo 5/29: LVEF: 35-40%, diffuse hypokinesis, g1dd, increased PA peak pressures. Received IVF's for septic shock initially, now requiring diuresis. Weaned off pressors 5/31.  - Volume status improving, weights trending downward as above. - Daily weight, I/O: Weight currently 153lbs from peak on 5/31 of 170lbs  Anemia: Normocytic, iron low - Feraheme - Aranesp given - Follow up CBC  DKA and uncontrolled IDDM: HbA1c 14.5% (average CBG 370mg /dl).  - Titrating insulin: Increase insulin to 15u. Continue SSI.  - Diabetes coordinator consulted  Troponin elevation: Flat trend in setting of acute renal failure and following PEA arrest, denying chest pain today. ECG without ischemic changes.  - Monitor.  Hyperkalemia - resolved Hypokalemia - resolved  Elevated LFTs: Suspect ischemic insult during PEA arrest, mild, downtrending. Resolved 6/4  Suspected anoxic brain injury and history of cognitive impairment, schizophrenia, bipolar disorder: Sister suspects she's been off medications for a while because she forgets to take them.  - Restarted outpatient medications (abilify, cogentin, wellbutrin, remeron) 6/1, mental status improved  DVT prophylaxis: Subcutaneous heparin Code Status: Full Family Communication: None at bedside this AM Disposition Plan: Anticipate DC to SNF (has been offered bed at [facility]) once work up and treatment of renal failure and septic shock completed. Probably by end of week.  Consultants:   PCCM primary  until 6/1  Nephrology 6/2  Procedures:  ETT 5/28 >>  5/30 Rt IJ CVL 5/28 >> 5/31  Antimicrobials:  Aztreonam 5/28  Vancomycin 5/28 - 5/29  Cefepime 5/28 - 5/30  Ceftriaxone 5/31   Cefpodoxime - 6/4  Subjective: Urine output significant, reports cough and dyspnea improving, still significant symptoms with exertion.  Objective: Vitals:   04/23/17 2043 04/24/17 0500 04/24/17 0534 04/24/17 0935  BP: (!) 108/53  (!) 114/53 (!) 106/46  Pulse: 71  (!) 57 96  Resp: 16  17 16   Temp: 97.9 F (36.6 C)  98.6 F (37 C) 98.4 F (36.9 C)  TempSrc:    Oral  SpO2: 90%  99% 94%  Weight: 69.4 kg (153 lb) 69.4 kg (153 lb)    Height:        Intake/Output Summary (Last 24 hours) at 04/24/17 1451 Last data filed at 04/24/17 1412  Gross per 24 hour  Intake              840 ml  Output             5375 ml  Net            -4535 ml   Filed Weights   04/20/17 2153 04/23/17 2043 04/24/17 0500  Weight: 76.8 kg (169 lb 4.8 oz) 69.4 kg (153 lb) 69.4 kg (153 lb)    Examination: General exam: female in no acute distress Respiratory system: Non-labored breathing on room air at rest. Diminished aeration of bases Cardiovascular system: Regular rate and rhythm. II/VI holosystolic murmur. UE edema trace, if at all, 1+ LE edema. Gastrointestinal system: Abdomen soft, non-tender, non-distended, with normoactive bowel sounds. No organomegaly or masses felt. Central nervous system: Alert, oriented x3. Conversant with no obvious cognitive deficit. No focal neurological deficits. Extremities: Warm, no deformities  Skin: No rashes, lesions no ulcers Psychiatry: Judgement and insight appear improved, intact. Mood & affect appropriate.   Data Reviewed: I have personally reviewed following labs and imaging studies  CBC:  Recent Labs Lab 04/18/17 0425 04/19/17 0218 04/21/17 1130 04/24/17 0618  WBC 6.6 6.3 5.1 8.9  HGB 9.5* 9.4* 10.0* 9.6*  HCT 28.5* 27.6* 31.2* 31.0*  MCV 78.5 79.3 81.7 83.8  PLT 88* 93* 111* 173   Basic Metabolic Panel:  Recent  Labs Lab 04/18/17 0340  04/20/17 0653 04/21/17 1130 04/22/17 0541 04/23/17 0753 04/24/17 0618  NA 140  < > 140 141 141 144 145  K 3.8  < > 3.6 3.4* 4.0 4.1 3.7  CL 106  < > 105 107 105 104 102  CO2 22  < > 19* 20* 21* 26 27  GLUCOSE 145*  < > 68 289* 260* 291* 178*  BUN 50*  < > 70* 79* 86* 91* 88*  CREATININE 3.73*  < > 5.48* 6.50* 7.07* 6.73* 5.80*  CALCIUM 7.5*  < > 8.1* 8.0* 7.9* 8.1* 8.3*  MG 2.3  --   --   --   --   --   --   PHOS 1.8*  --  2.4* 3.0  --  3.9 4.5  < > = values in this interval not displayed. GFR: Estimated Creatinine Clearance: 9.6 mL/min (A) (by C-G formula based on SCr of 5.8 mg/dL (H)). Liver Function Tests:  Recent Labs Lab 04/18/17 0340 04/20/17 0653 04/21/17 1130 04/22/17 0541 04/23/17 0753 04/24/17 0618  AST 88*  --   --  15  --   --   ALT 157*  --   --  35  --   --   ALKPHOS 189*  --   --  134*  --   --   BILITOT 0.9  --   --  0.9  --   --   PROT 4.7*  --   --  4.8*  --   --   ALBUMIN 1.7* 1.5* 1.9* 1.9* 2.3* 2.3*   No results for input(s): LIPASE, AMYLASE in the last 168 hours. No results for input(s): AMMONIA in the last 168 hours. Coagulation Profile: No results for input(s): INR, PROTIME in the last 168 hours. Cardiac Enzymes:  Recent Labs Lab 04/18/17 2040 04/19/17 0218 04/19/17 1116  TROPONINI 0.18* 0.20* 0.17*   BNP (last 3 results) No results for input(s): PROBNP in the last 8760 hours. HbA1C: No results for input(s): HGBA1C in the last 72 hours. CBG:  Recent Labs Lab 04/23/17 1200 04/23/17 1703 04/23/17 2205 04/24/17 0757 04/24/17 1206  GLUCAP 237* 277* 218* 260* 189*   Lipid Profile: No results for input(s): CHOL, HDL, LDLCALC, TRIG, CHOLHDL, LDLDIRECT in the last 72 hours. Thyroid Function Tests: No results for input(s): TSH, T4TOTAL, FREET4, T3FREE, THYROIDAB in the last 72 hours. Anemia Panel: No results for input(s): VITAMINB12, FOLATE, FERRITIN, TIBC, IRON, RETICCTPCT in the last 72 hours. Urine  analysis:    Component Value Date/Time   COLORURINE YELLOW 04/21/2017 1046   APPEARANCEUR HAZY (A) 04/21/2017 1046   LABSPEC 1.006 04/21/2017 1046   PHURINE 5.0 04/21/2017 1046   GLUCOSEU 50 (A) 04/21/2017 1046   HGBUR SMALL (A) 04/21/2017 1046   BILIRUBINUR NEGATIVE 04/21/2017 1046   BILIRUBINUR neg 05/22/2014 0935   KETONESUR NEGATIVE 04/21/2017 1046   PROTEINUR NEGATIVE 04/21/2017 1046   UROBILINOGEN 0.2 10/14/2014 0545   NITRITE NEGATIVE 04/21/2017 1046   LEUKOCYTESUR LARGE (A) 04/21/2017 1046   Recent Results (from the past 240 hour(s))  Blood Culture (routine x 2)     Status: None   Collection Time: 04/15/17 12:24 PM  Result Value Ref Range Status   Specimen Description BLOOD LEFT HAND  Final   Special Requests IN PEDIATRIC BOTTLE Blood Culture adequate volume  Final   Culture NO GROWTH 5 DAYS  Final   Report Status 04/20/2017 FINAL  Final  Urine culture     Status: None   Collection Time: 04/15/17  2:53 PM  Result Value Ref Range Status   Specimen Description URINE, CATHETERIZED  Final   Special Requests NONE  Final   Culture NO GROWTH  Final   Report Status 04/16/2017 FINAL  Final  MRSA PCR Screening     Status: None   Collection Time: 04/15/17  2:59 PM  Result Value Ref Range Status   MRSA by PCR NEGATIVE NEGATIVE Final    Comment:        The GeneXpert MRSA Assay (FDA approved for NASAL specimens only), is one component of a comprehensive MRSA colonization surveillance program. It is not intended to diagnose MRSA infection nor to guide or monitor treatment for MRSA infections.   Culture, blood (Routine X 2) w Reflex to ID Panel     Status: None   Collection Time: 04/15/17  7:15 PM  Result Value Ref Range Status   Specimen Description BLOOD LEFT HAND  Final   Special Requests IN PEDIATRIC BOTTLE Blood Culture adequate volume  Final   Culture NO GROWTH 5 DAYS  Final   Report Status 04/20/2017 FINAL  Final  Culture, respiratory (NON-Expectorated)      Status: None   Collection  Time: 04/16/17 10:15 AM  Result Value Ref Range Status   Specimen Description TRACHEAL ASPIRATE  Final   Special Requests NONE  Final   Gram Stain   Final    DEGENERATED CELLULAR MATERIAL PRESENT ABUNDANT GRAM POSITIVE COCCI IN PAIRS    Culture   Final    ABUNDANT STREPTOCOCCUS AGALACTIAE TESTING AGAINST S. AGALACTIAE NOT ROUTINELY PERFORMED DUE TO PREDICTABILITY OF AMP/PEN/VAN SUSCEPTIBILITY.    Report Status 04/18/2017 FINAL  Final      Radiology Studies: No results found.  Scheduled Meds: . ARIPiprazole  15 mg Oral Daily  . benztropine  1 mg Oral QHS  . buPROPion  75 mg Oral BID  . chlorhexidine  15 mL Mouth Rinse BID  . darbepoetin (ARANESP) injection - NON-DIALYSIS  60 mcg Subcutaneous Q Wed-1800  . furosemide  40 mg Oral BID  . heparin subcutaneous  5,000 Units Subcutaneous Q8H  . insulin aspart  0-15 Units Subcutaneous TID WC  . insulin aspart  0-5 Units Subcutaneous QHS  . [START ON 04/25/2017] insulin glargine  15 Units Subcutaneous Daily  . mouth rinse  15 mL Mouth Rinse q12n4p  . mirtazapine  15 mg Oral QHS  . multivitamin  15 mL Per Tube Daily  . pantoprazole  40 mg Oral QHS   Continuous Infusions: . ferumoxytol Stopped (04/22/17 1747)     LOS: 9 days   Time spent: 25 minutes.  Hazeline Junker, MD [facility] Pager [phone]  If 7PM-7AM, please contact night-coverage [email or web address] Password [email or web address] 04/24/2017, 2:51 PM

## 2017-04-24 NOTE — Progress Notes (Signed)
  Speech Language Pathology Treatment: Dysphagia  Patient Details Name: Narda RutherfordDeborah A Mariscal MRN: [id number] DOB: [birth year] Today's Date: 04/24/2017 Time: [id number] SLP Time Calculation (min) (ACUTE ONLY): 8 min  Assessment / Plan / Recommendation Clinical Impression  Pt completed most of breakfast but agreed to several more bites/sips. Delayed coughing after 4-5 consecutive sips nectar thick juice. Pt has baseline cough however she may have had encroachment into laryngeal vestibule. Respiratory rate normal compared to dyspnea in previous sessions. Plans for possible discharge today per case management note yesterday. Educated pt she will need to remain on nectar thick at SNF and ST can follow to recommend repeat (outpt) MBS in the next week or so. Continue Dys 3 (mech soft), nectar thick, no straws and small sips.    HPI HPI: 66 yo female with DKA, PEA cardiac arrest, acute renal failure, sepsis, VDRF 5/28-5/30. Hx of cognitive impairment, bipolar, CVA in 2015. Note MBS complete in 2015 during admission for similar dx, noted severe oropharyngeal dysphagia. FEES recommended after BSE.      SLP Plan  Continue with current plan of care       Recommendations  Diet recommendations: Dysphagia 3 (mechanical soft);Nectar-thick liquid Liquids provided via: Cup;No straw Medication Administration: Whole meds with puree Supervision: Intermittent supervision to cue for compensatory strategies Compensations: Slow rate;Small sips/bites Postural Changes and/or Swallow Maneuvers: Seated upright 90 degrees                Oral Care Recommendations: Oral care BID Follow up Recommendations: Skilled Nursing facility SLP Visit Diagnosis: Dysphagia, pharyngeal phase (R13.13) Plan: Continue with current plan of care       GO                Royce MacadamiaLitaker, Shaquinta Peruski Willis 04/24/2017, 8:49 AM  Breck CoonsLisa Willis Lonell FaceLitaker M.Ed [employer]-SLP Pager [phone]

## 2017-04-24 NOTE — Care Management Important Message (Signed)
Important Message  Patient Details  Name: Narda RutherfordDeborah A Vankuren MRN: [id number] Date of Birth: 01-30-1951   Medicare Important Message Given:  Yes    Atom Solivan Stefan ChurchBratton 04/24/2017, 12:06 PM

## 2017-04-25 LAB — RENAL FUNCTION PANEL
ALBUMIN: 2.2 g/dL — AB (ref 3.5–5.0)
ANION GAP: 15 (ref 5–15)
BUN: 79 mg/dL — AB (ref 6–20)
CALCIUM: 7.9 mg/dL — AB (ref 8.9–10.3)
CO2: 31 mmol/L (ref 22–32)
Chloride: 97 mmol/L — ABNORMAL LOW (ref 101–111)
Creatinine, Ser: 4.5 mg/dL — ABNORMAL HIGH (ref 0.44–1.00)
GFR calc Af Amer: 11 mL/min — ABNORMAL LOW (ref >60)
GFR calc non Af Amer: 9 mL/min — ABNORMAL LOW (ref >60)
GLUCOSE: 320 mg/dL — AB (ref 65–99)
PHOSPHORUS: 4.7 mg/dL — AB (ref 2.5–4.6)
Potassium: 3.1 mmol/L — ABNORMAL LOW (ref 3.5–5.1)
SODIUM: 143 mmol/L (ref 135–145)

## 2017-04-25 LAB — GLUCOSE, CAPILLARY
Glucose-Capillary: 304 mg/dL — ABNORMAL HIGH (ref 65–99)
Glucose-Capillary: 368 mg/dL — ABNORMAL HIGH (ref 65–99)
Glucose-Capillary: 181 mg/dL — ABNORMAL HIGH (ref 65–99)

## 2017-04-25 MED ORDER — INSULIN ASPART 100 UNIT/ML ~~LOC~~ SOLN: SUBCUTANEOUS | Status: DC

## 2017-04-25 MED ORDER — FUROSEMIDE 40 MG PO TABS: 40.0000 mg | ORAL_TABLET | Freq: Every day | ORAL | Status: DC

## 2017-04-25 NOTE — Progress Notes (Signed)
SATURATION QUALIFICATIONS: (This note is used to comply with regulatory documentation for home oxygen)  Patient Saturations on Room Air at Rest = 95%  Patient Saturations on Room Air while Ambulating = 88% while ambulating- but not a good waveform on monitor.  But once patient stopped walking her saturation was 91%.  Patient Saturations on 0 Liters of oxygen while Ambulating = n/a%  Please briefly explain why patient needs home oxygen:

## 2017-04-25 NOTE — Progress Notes (Signed)
[facility] report called and received by Selena BattenKim, RN. ; left unit via stretcher and EMS personnel. Verbalized understanding of written and verbal discharge instructions.

## 2017-04-25 NOTE — Clinical Social Work Placement (Signed)
   CLINICAL SOCIAL WORK PLACEMENT  NOTE 04/25/17 - DISCHARGE TO [facility] VIA AMBULANCE  Date:  04/25/2017  Patient Details  Name: [name] MRN: [id number] Date of Birth: Apr 07, 1951  Clinical Social Work is seeking post-discharge placement for this patient at the Skilled  Nursing Facility level of care (*CSW will initial, date and re-position this form in  chart as items are completed):  Yes   Patient/family provided with [facility] Clinical Social Work Department's list of facilities offering this level of care within the geographic area requested by the patient (or if unable, by the patient's family).  Yes   Patient/family informed of their freedom to choose among providers that offer the needed level of care, that participate in Medicare, Medicaid or managed care program needed by the patient, have an available bed and are willing to accept the patient.  Yes   Patient/family informed of [facility] ownership interest in [facility] and [facility], as well as of the fact that they are under no obligation to receive care at these facilities.  PASRR submitted to EDS on       PASRR number received on       Existing PASRR number confirmed on 04/20/17     FL2 transmitted to all facilities in geographic area requested by pt/family on 04/20/17     FL2 transmitted to all facilities within larger geographic area on       Patient informed that his/her managed care company has contracts with or will negotiate with certain facilities, including the following:         YES - Patient/family informed of bed offers received.  [facility]     Physician recommends and patient chooses bed at      [facility]  04[facility].  Patient to be transferred to facility by  ambulance     Patient family notified on  04/25/17 of transfer.  Name of family member notified:   Roxy HorsemanSister, [name] by phone - [phone].      PHYSICIAN       Additional Comment:    _______________________________________________ Cristobal Goldmannrawford, Martin Belling Bradley, LCSW 04/25/2017, 5:15 PM

## 2017-04-25 NOTE — Progress Notes (Signed)
Patient ID: [name], female   DOB: 07-13-1951, 66 y.o.   MRN: [id number]  [facility] Progress Note   Subjective:    Continues with excellent diuresis and creatinine continuing to trend down No complaint this AM Weight down 13 kg/7 days Seems confused to me - talking about her "change purse" and pointing to the bedcovers    Objective:    BP (!) 106/46 (BP Location: Right Arm)   Pulse 96   Temp 98.4 F (36.9 C) (Oral)   Resp 16   Ht 5\' 5"  (1.651 m)   Wt 69.4 kg (153 lb)   SpO2 94%   BMI 25.46 kg/m   Intake/Output Summary (Last 24 hours) at 04/24/17 1143 Last data filed at 04/24/17 1013  Gross per 24 hour  Intake              900 ml  Output             4950 ml  Net            -4050 ml   Weight change:   Physical Exam: BP 120/60 (BP Location: Right Arm)   Pulse 70   Temp 98.2 F (36.8 C) (Oral)   Resp 18   Ht 5\' 5"  (1.651 m)   Wt 64.2 kg (141 lb 8 oz)   SpO2 96%   BMI 23.55 kg/m  I/O last 3 completed shifts: In: 840 [P.O.:840] Out: 6800 [Urine:6800] Total I/O In: 243 [P.O.:243] Out: 750 [Urine:750]  Lying in bed, NAD No JVD Lungs with diminished BS bases,no crackles Tachy S1S2 No S3 2/6 holosystolic murmur USB Abdomen soft and not tender with + BS No edema now at all.    Recent Labs  04/24/17 0618 04/25/17 0449  NA 145 143  K 3.7 3.1*  CL 102 97*  CO2 27 31  GLUCOSE 178* 320*  BUN 88* 79*  CREATININE 5.80* 4.50*  CALCIUM 8.3* 7.9*  PHOS 4.5 4.7*    Recent Labs  04/24/17 0618 04/25/17 0449  ALBUMIN 2.3* 2.2*    Recent Labs  04/24/17 0618  WBC 8.9  HGB 9.6*  HCT 31.0*  MCV 83.8  PLT 173     Medications:     . ARIPiprazole  15 mg Oral Daily  . benztropine  1 mg Oral QHS  . buPROPion  75 mg Oral BID  . chlorhexidine  15 mL Mouth Rinse BID  . darbepoetin (ARANESP) injection - NON-DIALYSIS  60 mcg Subcutaneous Q Wed-1800  . furosemide  40 mg Oral BID  . heparin subcutaneous  5,000 Units  Subcutaneous Q8H  . insulin aspart  0-15 Units Subcutaneous TID WC  . insulin aspart  0-5 Units Subcutaneous QHS  . insulin glargine  15 Units Subcutaneous Daily  . mouth rinse  15 mL Mouth Rinse q12n4p  . mirtazapine  15 mg Oral QHS  . multivitamin  15 mL Per Tube Daily  . pantoprazole  40 mg Oral QHS   . ferumoxytol Stopped (04/22/17 1747)    Background: 66 yo female. PMH DM, HTN, CKD 3 (BL creatinine 1.7-2). Admitted w/DKA, had PEA arrest, UTI w/poss septic shock. Creatinine on admission 2.34 (5/28) and continued to rise. Renal consulted 6/2 when creatinine 5.48, marginal UOP. (Of note had similar episode 09/2014 and required CRRT that admission). Has not required RRT this admission and renal function is improving.  Assessment/ Plan:     1. AKI on CKD3 - hemodynamically mediated AKI 2/2 DKA/PEA arrest in  pt with no renal reserve (AKI 2015 requiring CRRT) Urine electrolytes pointed to ATN. Now in recovery phase. Excellent UOP on lasix (cut back to 40 BID yesterday) and weight is down 13 kg in 1 week (I actually think most of this has been spontaneous diuresis) 1. Continue to trend labs and UOP. Will see how much recovery she gets - baseline prior to current events 1.7-2. 2.  Reduce lasix to once a day 3. She could probably be discharged to SNF from a renal standpoint if labs are done at least twice a week for 2-3 weeks (or until creatinine reaches plateau) 4. I will see her in the office in several weeks to establish CKD followup - [facility]: July 2 arrive at 9:30 AM for a 10AM appt 2. Anion gap metabolic acidosis:2/2 AKI. Required bicarb drip earlier this admit. Normal bicarb no w no replacement needed 3. DKA - resolved. Initial A1C >14. Primary team titrating Lantus 4. Status post PEA arrest. Flat troponins.  5. Acute sCHF. LV EF 35% - 40% on ECHO 04/16/17. Excellent UOP on po lasix 6. Anemia:Chronic ds + Fe def (Fe 11, unable to calc tsat).  Dosing  Feraheme 510 IV x 2 doses. Dosed 6/4. Aranesp 60 mcg given 6/5 for Hb just under 10.  1. Can re-evaluate Fe/Anemia status when I see as hospital followup  7. Acute hypoxemic resp failure 2/2 strep pneumonia and vol xs - resolved. ATB's done  Renal disposition as above. Will sign off at this time. F/U appt with me as noted above with medication and lab recommendations.    Camille Balynthia Umar Patmon, MD [facility] [phone] Pager 04/24/2017, 11:43 AM

## 2017-04-25 NOTE — Clinical Social Work Note (Signed)
[facility]. See Placement Note for d/c details. Nurse will call report to nurse at facility - [phone].  Genelle BalVanessa Merdith Boyd, MSW, LCSW Licensed Clinical Social Worker Clinical Social Work Department [facility] [phone]

## 2017-04-25 NOTE — Discharge Summary (Signed)
Physician Discharge Summary  [name]:[id number] DOB: May 05, 1951 DOA: 04/15/2017  PCP: Abigail Magnus, PA-C  Admit date: 04/15/2017 Discharge date: 04/25/2017  Admitted From: Home Disposition: SNF   Recommendations for Outpatient Follow-up:  1. Follow up with PCP in 1-2 weeks. 2. Will need BMP twice weekly.  3. Follow up with nephrology scheduled 05/20/2017 as below.  Home Health: N/A Equipment/Devices: 1L O2 Discharge Condition: Stable CODE STATUS: Full Diet recommendation: Carb-modified  Brief/Interim Summary: [name] is a 66 y.o. female with a history of schizophrenia, poorly-controlled diabetes and DKA who presented to the ED after her family was unable to wake her from sleep. On arrival she was hyperglycemic, acidotic, and hypotensive, developing into PEA arrest with ROSC after about 5 minutes. She was intubated, put on pressors and insulin infusion, with eventual improvement. Sputum culture grew S. agalactiae consistent with streptococcal pneumonia, started antibiotics 5/29. She was extubated 5/30, off pressors since 5/31, and started back on basal/bolus insulin. Hospitalists assumed care 6/1. With progressive rise in creatinine, nephrology was consulted. Creatinine peaked on 6/4 and has been showing signs of renal recovery, significant urine output, with diuretics which are being weaned.  Discharge Diagnoses:  Principal Problem:   DKA (diabetic ketoacidoses) (HCC) Active Problems:   Cardiac arrest (HCC)   Septic shock (HCC)   Acute respiratory failure with hypoxia (HCC)   Anoxic brain injury (HCC)   Schizophrenia (HCC)   Thrombocytopenia (HCC)   Arterial hypotension   Acute respiratory acidosis   Acute renal failure (ARF) (HCC)   Acute on chronic systolic CHF (congestive heart failure) (HCC)  Acute hypoxemic respiratory failure: In setting of DKA, septic shock due to streptococcal pneumonia (S. agalactiae on sputum culture). Also suspect element of  volume overload. Extubated 5/30. - Weaned from oxygen at rest, still needing 1L on exertion. Anticipate this will be weaned.  - Treated pneumonia (strep. agalactiae) with 7 days abx (ceftriaxone > cefpodoxime; completed) - Treat volume overload with lasix as below.  Acute renal failure and stage III CKD: Suspect ATN due to PEA arrest, septic shock, in pt with diminished reserve (required CRRT in similar setting in the past). Renal U/S shows 13.0 / 13.9cm kidneys with normal echogenicity and cortical thickness, no hydro. FENa elevated at 15%. Creatinine peak 7.07 on 6/4, down to 4.5 on 6/7, day of discharge. Presumptive baseline 1.7-2.  - Deescalating po lasix per nephrology: 40mg  po daily.  - Will need labs twice weekly for 2 - 3 weeks or until creatinine reaches plateau.  - Follow up scheduled with nephrology, Dr. Eliott Zamora July 2 9:30am for 10:00am appt.   Acute HFrEF s/p PEA cardiac arrest: Echo 5/29: LVEF: 35-40%, diffuse hypokinesis, g1dd, increased PA peak pressures. Received IVF's for septic shock initially, now requiring diuresis. Weaned off pressors 5/31.  - Volume status improving, weights trending downward as above. - Daily weight, I/O: Weight currently 141lbs from peak on 5/31 of 170lbs  Anemia: Normocytic, iron low - Feraheme given, continue po iron. - Aranesp given - Follow up CBC  DKA and uncontrolled IDDM: HbA1c 14.5% (average CBG 370mg /dl).  - Titrating insulin: Home medications include degludec, levemir, and lispro.  - Briefly decreased, but is eating better now, so will restart levemir 20 units daily qAM + moderate sliding scale coverage at discharge as below. Will likely need increase in insulin based on short-acting needs.  Troponin elevation: Flat trend in setting of acute renal failure and following PEA arrest, denying chest pain today. ECG without ischemic changes.  -  Monitor for chest pain.  Hyperkalemia - resolved Hypokalemia - resolved  Elevated LFTs:  Suspect ischemic insult during PEA arrest, mild, downtrending. Resolved 6/4. - Ok to restart statin  Suspected anoxic brain injury and history of cognitive impairment, schizophrenia, bipolar disorder: Sister suspects she's been off medications for a while because she forgets to take them.  - Restarted outpatient medications (abilify, cogentin, wellbutrin, remeron) 6/1, mental status improved  Discharge Instructions  Allergies as of 04/25/2017      Reactions   Penicillins    Rash      Medication List    STOP taking these medications   Insulin Degludec 200 UNIT/ML Sopn   Insulin Lispro 200 UNIT/ML Sopn     TAKE these medications   ARIPiprazole 15 MG tablet Commonly known as:  ABILIFY Take 15 mg by mouth daily.   atorvastatin 10 MG tablet Commonly known as:  LIPITOR Take 1 tablet (10 mg total) by mouth daily. What changed:  how much to take  when to take this   benztropine 1 MG tablet Commonly known as:  COGENTIN Take 1 mg by mouth at bedtime.   buPROPion 75 MG tablet Commonly known as:  WELLBUTRIN Take 75 mg by mouth 2 (two) times daily.   ferrous sulfate 325 (65 FE) MG tablet Take 325 mg by mouth daily with breakfast.   furosemide 40 MG tablet Commonly known as:  LASIX Take 1 tablet (40 mg total) by mouth daily. Start taking on:  04/26/2017   insulin aspart 100 UNIT/ML injection Commonly known as:  novoLOG Inject three times daily with meals.  Correction coverage: Moderate (average weight, post-op) CBG < 70: implement hypoglycemia protocol CBG 70 - 120: 0 units CBG 121 - 150: 2 units CBG 151 - 200: 3 units CBG 201 - 250: 5 units CBG 251 - 300: 8 units CBG 301 - 350: 11 units CBG 351 - 400: 15 units CBG > 400: call MD and obtain STAT lab verification   insulin detemir 100 UNIT/ML injection Commonly known as:  LEVEMIR Inject 0.2 mLs (20 Units total) into the skin at bedtime.   mirtazapine 15 MG tablet Commonly known as:  REMERON Take 15 mg by mouth at  bedtime.   MYRBETRIQ 50 MG Tb24 tablet Generic drug:  mirabegron ER Take 50 mg by mouth daily.      Follow-up Information    Long, Morrie Sheldon, PA-C Follow up.   Specialty:  Physician Assistant Contact information: [address] [place] [address] [phone]        Camille Bal, MD Follow up on 05/20/2017.   Specialty:  Nephrology Why:  arrive at 9:30am for 10:00am appointment Contact information: [address] [place] [address] [phone]          Allergies  Allergen Reactions  . Penicillins     Rash     Consultations:  PCCM primary until 6/1  Nephrology 6/2  Procedures/Studies: US Renal  Result Date: 04/18/2017 CLINICAL DATA:  Acute renal failure EXAM: RENAL / URINARY TRACT ULTRASOUND COMPLETE COMPARISON:  October 04, 2014 FINDINGS: Right Kidney: Length: 13.0 cm. Echogenicity and renal cortical thickness are within normal limits. No mass, perinephric fluid, or hydronephrosis visualized. No sonographically demonstrable calculus or ureterectasis. Left Kidney: Length: 13.9 cm. Echogenicity and renal cortical thickness are within normal limits. No mass, perinephric fluid, or hydronephrosis visualized. No sonographically demonstrable calculus or ureterectasis. Bladder: There is apparent urinary bladder wall thickening. No demonstrable mass appreciable by ultrasound. There is mild ascites.  IMPRESSION: Question a degree of cystitis. No renal lesions evident by ultrasound. Mild ascites. Electronically Signed   By: Bretta Bang III M.D.   On: 04/18/2017 16:37   Dg Chest Port 1 View  Result Date: 04/19/2017 CLINICAL DATA:  Hypoxia EXAM: PORTABLE CHEST 1 VIEW COMPARISON:  Apr 18, 2017 FINDINGS: There is persistent interstitial edema bilaterally. There are small pleural effusions bilaterally, slightly larger on the right than on the left. There is no frank airspace consolidation. Heart is mildly enlarged. There is mild pulmonary venous hypertension. There  is aortic atherosclerosis. No evident adenopathy. No bone lesions. IMPRESSION: Persistent changes of congestive heart failure. Degree of edema is similar to 1 day prior. Small pleural effusions are noted. Stable cardiac silhouette. There is aortic atherosclerosis. Electronically Signed   By: Bretta Bang III M.D.   On: 04/19/2017 07:59   Dg Chest Port 1 View  Result Date: 04/18/2017 CLINICAL DATA:  66 year old female with chest pain. EXAM: PORTABLE CHEST 1 VIEW COMPARISON:  0443 hours today and earlier. FINDINGS: Portable AP semi upright view at 2029 hours. Mildly lower lung volumes. Increasing perihilar interstitial opacity and indistinctness of pulmonary vasculature. Stable cardiac size and mediastinal contours. Visualized tracheal air column is within normal limits. No pneumothorax. No definite pleural effusion or consolidation. IMPRESSION: Increasing perihilar and indistinct interstitial opacity most suggestive of acute pulmonary edema. Electronically Signed   By: Odessa Fleming M.D.   On: 04/18/2017 20:43   Dg Chest Port 1 View  Result Date: 04/18/2017 CLINICAL DATA:  Hypoxia. EXAM: PORTABLE CHEST 1 VIEW COMPARISON:  04/17/2017 . FINDINGS: Interim extubation and removal of NG tube. Right IJ line stable position. Cardiomegaly with diffuse bilateral from interstitial prominence noted on today's exam. Small left pleural effusion . Findings consistent with mild CHF. IMPRESSION: 1. Interim extubation removal of NG tube. Right IJ line stable position. 2. Cardiomegaly with diffuse bilateral from interstitial prominence noted on today's exam. Small left pleural effusion. Findings consistent CHF. Electronically Signed   By: Maisie Fus  Register   On: 04/18/2017 06:36   Dg Chest Port 1 View  Result Date: 04/17/2017 CLINICAL DATA:  Hypoxia EXAM: PORTABLE CHEST 1 VIEW COMPARISON:  Apr 16, 2017 FINDINGS: Endotracheal tube tip is 1.2 cm above the carina. Nasogastric tube tip and side port are below the diaphragm.  Central catheter tip is in the superior vena cava near the cavoatrial junction. No pneumothorax. There is patchy airspace opacity in the right base with small right pleural effusion. The left lung is clear. Heart is upper normal in size with pulmonary vascularity within normal limits. No adenopathy evident. No bone lesions. IMPRESSION: Tube and catheter positions as described without pneumothorax. Note that the endotracheal tube tip is fairly close to the carina ; it may be prudent to consider withdrawing endotracheal tube approximately 2 cm. There is patchy opacity in the right base which may represent early pneumonia or possibly aspiration. Small right pleural effusion evident. Left lung clear. Stable cardiac silhouette. Electronically Signed   By: Bretta Bang III M.D.   On: 04/17/2017 07:53   Dg Chest Port 1 View  Result Date: 04/16/2017 CLINICAL DATA:  Repositioning of endotracheal tube. EXAM: PORTABLE CHEST 1 VIEW COMPARISON:  Portable chest x-ray of earlier today. FINDINGS: The endotracheal tube tip now lies approximately 24.cm above the carina. The lungs are reasonably well inflated. The interstitial markings remain coarse especially in the lower lobes. The heart is normal in size. There is calcification in the wall of the mitral valvular  annulus. The esophagogastric tube tip lies in the gastric cardia. External pacemaker defibrillator pads are present. IMPRESSION: Interval successful repositioning of the endotracheal tube since at the tip now lies 2.4 cm above the carina. Electronically Signed   By: David  SwazilandJordan M.D.   On: 04/16/2017 09:55   Dg Chest Port 1 View  Result Date: 04/16/2017 CLINICAL DATA:  Intubation.  Respiratory failure . EXAM: PORTABLE CHEST 1 VIEW COMPARISON:  04/15/2017. FINDINGS: Endotracheal tube, NG tube, right IJ line stable position. Heart size stable. Increased interstitial markings suggesting CHF. Pneumonitis cannot be excluded. Low lung volumes basilar atelectasis.  IMPRESSION: 1.  Lines and tubes in stable position. 2. Increased interstitial markings bilaterally suggesting CHF. Pneumonitis cannot be excluded . 3. Low lung volumes with basilar atelectasis. Electronically Signed   By: Maisie Fushomas  Register   On: 04/16/2017 07:28   Dg Chest Portable 1 View  Result Date: 04/15/2017 CLINICAL DATA:  Check endotracheal tube placement EXAM: PORTABLE CHEST 1 VIEW COMPARISON:  04/15/2017 FINDINGS: Cardiac shadow is stable. Endotracheal tube is now seen 2 cm above the carina. Right jugular central line is noted in the mid superior vena cava. No pneumothorax is seen. Stable vascular congestion is noted. No focal infiltrate is seen. IMPRESSION: Mild vascular congestion is again seen and stable. Tubes and lines as described. Electronically Signed   By: Alcide CleverMark  Lukens M.D.   On: 04/15/2017 13:20   Dg Chest Port 1 View  Result Date: 04/15/2017 CLINICAL DATA:  Altered mental status. History of stroke. Hypotension and hypoxia. EXAM: PORTABLE CHEST 1 VIEW COMPARISON:  09/24/2016 and 11/01/2014. FINDINGS: 1142 hours. The heart size and mediastinal contours are stable. There is mild aortic atherosclerosis and vascular congestion. No focal airspace disease, pneumothorax or significant pleural effusion identified. The bones appear intact. Telemetry leads overlie the chest. IMPRESSION: Vascular congestion without overt edema or focal airspace disease. Electronically Signed   By: Carey BullocksWilliam  Veazey M.D.   On: 04/15/2017 12:05   Dg Abd Portable 1v  Result Date: 04/15/2017 CLINICAL DATA:  Orogastric tube placement, on ventilator, history diabetes mellitus EXAM: PORTABLE ABDOMEN - 1 VIEW COMPARISON:  Portable exam 1631 hours compared to 10/14/2014 FINDINGS: Tip of orogastric tube projects over stomach. Visualized bowel gas pattern unremarkable. Mild bibasilar opacities question atelectasis versus infiltrate. Bones demineralized. External pacing leads project over chest. IMPRESSION: Tip of orogastric  tube projects over stomach. Electronically Signed   By: Ulyses SouthwardMark  Boles M.D.   On: 04/15/2017 17:15   ETT 5/28 >>5/30 Rt IJ CVL 5/28 >> 5/31  Subjective: Urine output continues to be significant with improvement in leg swelling. No dyspnea at rest. Cough is continuing with less production. No chest pain or fevers.   Discharge Exam: BP 120/60 (BP Location: Right Arm)   Pulse 70   Temp 98.2 F (36.8 C) (Oral)   Resp 18   Ht 5\' 5"  (1.651 m)   Wt 64.2 kg (141 lb 8 oz)   SpO2 96%   BMI 23.55 kg/m   General: No distress Cardiovascular: Regular rate and rhythm. II/VI holosystolic murmur. No longer UE edema. 1+ LE edema. Respiratory: Nonlabored on room air, diminished mildly at bases. No crackles or wheezes. Neuro/psych: Alert, oriented x3. Conversant without focal deficits. Insight fair. Mood and affect appropriate.  Labs: Basic Metabolic Panel:  Recent Labs Lab 04/20/17 0653 04/21/17 1130 04/22/17 0541 04/23/17 0753 04/24/17 0618 04/25/17 0449  NA 140 141 141 144 145 143  K 3.6 3.4* 4.0 4.1 3.7 3.1*  CL 105 107  105 104 102 97*  CO2 19* 20* 21* 26 27 31   GLUCOSE 68 289* 260* 291* 178* 320*  BUN 70* 79* 86* 91* 88* 79*  CREATININE 5.48* 6.50* 7.07* 6.73* 5.80* 4.50*  CALCIUM 8.1* 8.0* 7.9* 8.1* 8.3* 7.9*  PHOS 2.4* 3.0  --  3.9 4.5 4.7*   Liver Function Tests:  Recent Labs Lab 04/21/17 1130 04/22/17 0541 04/23/17 0753 04/24/17 0618 04/25/17 0449  AST  --  15  --   --   --   ALT  --  35  --   --   --   ALKPHOS  --  134*  --   --   --   BILITOT  --  0.9  --   --   --   PROT  --  4.8*  --   --   --   ALBUMIN 1.9* 1.9* 2.3* 2.3* 2.2*   CBC:  Recent Labs Lab 04/19/17 0218 04/21/17 1130 04/24/17 0618  WBC 6.3 5.1 8.9  HGB 9.4* 10.0* 9.6*  HCT 27.6* 31.2* 31.0*  MCV 79.3 81.7 83.8  PLT 93* 111* 173   Cardiac Enzymes:  Recent Labs Lab 04/18/17 2040 04/19/17 0218 04/19/17 1116  TROPONINI 0.18* 0.20* 0.17*   Urinalysis    Component Value Date/Time    COLORURINE YELLOW 04/21/2017 1046   APPEARANCEUR HAZY (A) 04/21/2017 1046   LABSPEC 1.006 04/21/2017 1046   PHURINE 5.0 04/21/2017 1046   GLUCOSEU 50 (A) 04/21/2017 1046   HGBUR SMALL (A) 04/21/2017 1046   BILIRUBINUR NEGATIVE 04/21/2017 1046   BILIRUBINUR neg 05/22/2014 0935   KETONESUR NEGATIVE 04/21/2017 1046   PROTEINUR NEGATIVE 04/21/2017 1046   UROBILINOGEN 0.2 10/14/2014 0545   NITRITE NEGATIVE 04/21/2017 1046   LEUKOCYTESUR LARGE (A) 04/21/2017 1046    Microbiology Recent Results (from the past 240 hour(s))  Urine culture     Status: None   Collection Time: 04/15/17  2:53 PM  Result Value Ref Range Status   Specimen Description URINE, CATHETERIZED  Final   Special Requests NONE  Final   Culture NO GROWTH  Final   Report Status 04/16/2017 FINAL  Final  MRSA PCR Screening     Status: None   Collection Time: 04/15/17  2:59 PM  Result Value Ref Range Status   MRSA by PCR NEGATIVE NEGATIVE Final    Comment:        The GeneXpert MRSA Assay (FDA approved for NASAL specimens only), is one component of a comprehensive MRSA colonization surveillance program. It is not intended to diagnose MRSA infection nor to guide or monitor treatment for MRSA infections.   Culture, blood (Routine X 2) w Reflex to ID Panel     Status: None   Collection Time: 04/15/17  7:15 PM  Result Value Ref Range Status   Specimen Description BLOOD LEFT HAND  Final   Special Requests IN PEDIATRIC BOTTLE Blood Culture adequate volume  Final   Culture NO GROWTH 5 DAYS  Final   Report Status 04/20/2017 FINAL  Final  Culture, respiratory (NON-Expectorated)     Status: None   Collection Time: 04/16/17 10:15 AM  Result Value Ref Range Status   Specimen Description TRACHEAL ASPIRATE  Final   Special Requests NONE  Final   Gram Stain   Final    DEGENERATED CELLULAR MATERIAL PRESENT ABUNDANT GRAM POSITIVE COCCI IN PAIRS    Culture   Final    ABUNDANT STREPTOCOCCUS AGALACTIAE TESTING AGAINST S.  AGALACTIAE NOT ROUTINELY PERFORMED DUE TO  PREDICTABILITY OF AMP/PEN/VAN SUSCEPTIBILITY.    Report Status 04/18/2017 FINAL  Final    Time coordinating discharge: Approximately 40 minutes  Hazeline Junker, MD  [facility] 04/25/2017, 1:09 PM Pager [phone]

## 2019-12-04 ENCOUNTER — Inpatient Hospital Stay (HOSPITAL_COMMUNITY)
Admission: EM | Admit: 2019-12-04 | Discharge: 2019-12-06 | DRG: 637 | Disposition: A | Discharge status: Home Health | Payer: Medicare HMO | Attending: Internal Medicine | Admitting: Internal Medicine

## 2019-12-04 ENCOUNTER — Emergency Department (HOSPITAL_COMMUNITY): Payer: Medicare HMO

## 2019-12-04 ENCOUNTER — Encounter (HOSPITAL_COMMUNITY): Payer: Self-pay | Admitting: Emergency Medicine

## 2019-12-04 ENCOUNTER — Other Ambulatory Visit: Payer: Self-pay

## 2019-12-04 DIAGNOSIS — F39 Unspecified mood [affective] disorder: Secondary | ICD-10-CM | POA: Diagnosis present

## 2019-12-04 DIAGNOSIS — Z794 Long term (current) use of insulin: Secondary | ICD-10-CM | POA: Diagnosis not present

## 2019-12-04 DIAGNOSIS — Z20822 Contact with and (suspected) exposure to covid-19: Secondary | ICD-10-CM | POA: Diagnosis present

## 2019-12-04 DIAGNOSIS — Z833 Family history of diabetes mellitus: Secondary | ICD-10-CM

## 2019-12-04 DIAGNOSIS — Z8673 Personal history of transient ischemic attack (TIA), and cerebral infarction without residual deficits: Secondary | ICD-10-CM | POA: Diagnosis not present

## 2019-12-04 DIAGNOSIS — R400 Somnolence: Secondary | ICD-10-CM | POA: Diagnosis not present

## 2019-12-04 DIAGNOSIS — E1110 Type 2 diabetes mellitus with ketoacidosis without coma: Secondary | ICD-10-CM

## 2019-12-04 DIAGNOSIS — N179 Acute kidney failure, unspecified: Secondary | ICD-10-CM

## 2019-12-04 DIAGNOSIS — Z87891 Personal history of nicotine dependence: Secondary | ICD-10-CM | POA: Diagnosis not present

## 2019-12-04 DIAGNOSIS — G9341 Metabolic encephalopathy: Secondary | ICD-10-CM | POA: Diagnosis present

## 2019-12-04 DIAGNOSIS — E861 Hypovolemia: Secondary | ICD-10-CM | POA: Diagnosis not present

## 2019-12-04 DIAGNOSIS — I5022 Chronic systolic (congestive) heart failure: Secondary | ICD-10-CM

## 2019-12-04 DIAGNOSIS — Z9641 Presence of insulin pump (external) (internal): Secondary | ICD-10-CM | POA: Diagnosis present

## 2019-12-04 DIAGNOSIS — F209 Schizophrenia, unspecified: Secondary | ICD-10-CM | POA: Diagnosis present

## 2019-12-04 DIAGNOSIS — N3946 Mixed incontinence: Secondary | ICD-10-CM | POA: Diagnosis present

## 2019-12-04 DIAGNOSIS — Z825 Family history of asthma and other chronic lower respiratory diseases: Secondary | ICD-10-CM | POA: Diagnosis not present

## 2019-12-04 DIAGNOSIS — Z86718 Personal history of other venous thrombosis and embolism: Secondary | ICD-10-CM | POA: Diagnosis not present

## 2019-12-04 DIAGNOSIS — Z8674 Personal history of sudden cardiac arrest: Secondary | ICD-10-CM | POA: Diagnosis not present

## 2019-12-04 DIAGNOSIS — E1010 Type 1 diabetes mellitus with ketoacidosis without coma: Principal | ICD-10-CM | POA: Diagnosis present

## 2019-12-04 DIAGNOSIS — F259 Schizoaffective disorder, unspecified: Secondary | ICD-10-CM | POA: Diagnosis present

## 2019-12-04 DIAGNOSIS — F203 Undifferentiated schizophrenia: Secondary | ICD-10-CM | POA: Diagnosis not present

## 2019-12-04 DIAGNOSIS — Z823 Family history of stroke: Secondary | ICD-10-CM | POA: Diagnosis not present

## 2019-12-04 DIAGNOSIS — Z8249 Family history of ischemic heart disease and other diseases of the circulatory system: Secondary | ICD-10-CM | POA: Diagnosis not present

## 2019-12-04 DIAGNOSIS — E1111 Type 2 diabetes mellitus with ketoacidosis with coma: Secondary | ICD-10-CM | POA: Diagnosis not present

## 2019-12-04 DIAGNOSIS — Z880 Allergy status to penicillin: Secondary | ICD-10-CM

## 2019-12-04 DIAGNOSIS — E785 Hyperlipidemia, unspecified: Secondary | ICD-10-CM | POA: Diagnosis present

## 2019-12-04 DIAGNOSIS — R4182 Altered mental status, unspecified: Secondary | ICD-10-CM | POA: Diagnosis present

## 2019-12-04 LAB — BLOOD GAS, VENOUS
Acid-base deficit: 14.3 mmol/L — ABNORMAL HIGH (ref 0.0–2.0)
Bicarbonate: 13.4 mmol/L — ABNORMAL LOW (ref 20.0–28.0)
FIO2: 21
O2 Saturation: 65.8 %
Patient temperature: 36.6
pCO2, Ven: 26.8 mmHg — ABNORMAL LOW (ref 44.0–60.0)
pH, Ven: 7.254 (ref 7.250–7.430)
pO2, Ven: 35.2 mmHg (ref 32.0–45.0)

## 2019-12-04 LAB — BASIC METABOLIC PANEL
Anion gap: 17 — ABNORMAL HIGH (ref 5–15)
Anion gap: 10 (ref 5–15)
BUN: 54 mg/dL — ABNORMAL HIGH (ref 8–23)
BUN: 48 mg/dL — ABNORMAL HIGH (ref 8–23)
CO2: 10 mmol/L — ABNORMAL LOW (ref 22–32)
CO2: 15 mmol/L — ABNORMAL LOW (ref 22–32)
Calcium: 8.8 mg/dL — ABNORMAL LOW (ref 8.9–10.3)
Calcium: 8.5 mg/dL — ABNORMAL LOW (ref 8.9–10.3)
Chloride: 116 mmol/L — ABNORMAL HIGH (ref 98–111)
Chloride: 118 mmol/L — ABNORMAL HIGH (ref 98–111)
Creatinine, Ser: 1.4 mg/dL — ABNORMAL HIGH (ref 0.44–1.00)
Creatinine, Ser: 1.07 mg/dL — ABNORMAL HIGH (ref 0.44–1.00)
GFR calc Af Amer: 45 mL/min — ABNORMAL LOW (ref >60)
GFR calc Af Amer: >60 mL/min (ref >60)
GFR calc non Af Amer: 39 mL/min — ABNORMAL LOW (ref >60)
GFR calc non Af Amer: 54 mL/min — ABNORMAL LOW (ref >60)
Glucose, Bld: 325 mg/dL — ABNORMAL HIGH (ref 70–99)
Glucose, Bld: 195 mg/dL — ABNORMAL HIGH (ref 70–99)
Potassium: 4.5 mmol/L (ref 3.5–5.1)
Potassium: 3.5 mmol/L (ref 3.5–5.1)
Sodium: 143 mmol/L (ref 135–145)
Sodium: 143 mmol/L (ref 135–145)

## 2019-12-04 LAB — CBC WITH DIFFERENTIAL/PLATELET
Abs Immature Granulocytes: 0.1 10*3/uL — ABNORMAL HIGH (ref 0.00–0.07)
Basophils Absolute: 0.1 10*3/uL (ref 0.0–0.1)
Basophils Relative: 1 %
Eosinophils Absolute: 0.5 10*3/uL (ref 0.0–0.5)
Eosinophils Relative: 6 %
HCT: 46.6 % — ABNORMAL HIGH (ref 36.0–46.0)
Hemoglobin: 14.9 g/dL (ref 12.0–15.0)
Immature Granulocytes: 1 %
Lymphocytes Relative: 12 %
Lymphs Abs: 1 10*3/uL (ref 0.7–4.0)
MCH: 25.9 pg — ABNORMAL LOW (ref 26.0–34.0)
MCHC: 32 g/dL (ref 30.0–36.0)
MCV: 81 fL (ref 80.0–100.0)
Monocytes Absolute: 0.4 10*3/uL (ref 0.1–1.0)
Monocytes Relative: 5 %
Neutro Abs: 6.2 10*3/uL (ref 1.7–7.7)
Neutrophils Relative %: 75 %
Platelets: 201 10*3/uL (ref 150–400)
RBC: 5.75 MIL/uL — ABNORMAL HIGH (ref 3.87–5.11)
RDW: 15.7 % — ABNORMAL HIGH (ref 11.5–15.5)
WBC: 8.2 10*3/uL (ref 4.0–10.5)
nRBC: 0 % (ref 0.0–0.2)

## 2019-12-04 LAB — URINALYSIS, ROUTINE W REFLEX MICROSCOPIC
Bacteria, UA: NONE SEEN
Bilirubin Urine: NEGATIVE
Glucose, UA: >=500 mg/dL — AB
Ketones, ur: 80 mg/dL — AB
Leukocytes,Ua: NEGATIVE
Nitrite: NEGATIVE
Protein, ur: 100 mg/dL — AB
Specific Gravity, Urine: 1.021 (ref 1.005–1.030)
pH: 5 (ref 5.0–8.0)

## 2019-12-04 LAB — CBG MONITORING, ED
Glucose-Capillary: >600 mg/dL (ref 70–99)
Glucose-Capillary: 290 mg/dL — ABNORMAL HIGH (ref 70–99)
Glucose-Capillary: 251 mg/dL — ABNORMAL HIGH (ref 70–99)
Glucose-Capillary: 193 mg/dL — ABNORMAL HIGH (ref 70–99)
Glucose-Capillary: 189 mg/dL — ABNORMAL HIGH (ref 70–99)
Glucose-Capillary: 199 mg/dL — ABNORMAL HIGH (ref 70–99)

## 2019-12-04 LAB — RAPID URINE DRUG SCREEN, HOSP PERFORMED
Amphetamines: NOT DETECTED
Barbiturates: NOT DETECTED
Benzodiazepines: NOT DETECTED
Cocaine: NOT DETECTED
Opiates: NOT DETECTED
Tetrahydrocannabinol: NOT DETECTED

## 2019-12-04 LAB — COMPREHENSIVE METABOLIC PANEL
ALT: 17 U/L (ref 0–44)
AST: 14 U/L — ABNORMAL LOW (ref 15–41)
Albumin: 4.5 g/dL (ref 3.5–5.0)
Alkaline Phosphatase: 141 U/L — ABNORMAL HIGH (ref 38–126)
Anion gap: >20 — ABNORMAL HIGH (ref 5–15)
BUN: 59 mg/dL — ABNORMAL HIGH (ref 8–23)
CO2: 7 mmol/L — ABNORMAL LOW (ref 22–32)
Calcium: 9 mg/dL (ref 8.9–10.3)
Chloride: 113 mmol/L — ABNORMAL HIGH (ref 98–111)
Creatinine, Ser: 1.67 mg/dL — ABNORMAL HIGH (ref 0.44–1.00)
GFR calc Af Amer: 37 mL/min — ABNORMAL LOW (ref >60)
GFR calc non Af Amer: 32 mL/min — ABNORMAL LOW (ref >60)
Glucose, Bld: 449 mg/dL — ABNORMAL HIGH (ref 70–99)
Potassium: 4.6 mmol/L (ref 3.5–5.1)
Sodium: 142 mmol/L (ref 135–145)
Total Bilirubin: 1.8 mg/dL — ABNORMAL HIGH (ref 0.3–1.2)
Total Protein: 8.1 g/dL (ref 6.5–8.1)

## 2019-12-04 LAB — ETHANOL: Alcohol, Ethyl (B): <10 mg/dL (ref <10)

## 2019-12-04 LAB — BETA-HYDROXYBUTYRIC ACID: Beta-Hydroxybutyric Acid: >8 mmol/L — ABNORMAL HIGH (ref 0.05–0.27)

## 2019-12-04 MED ORDER — DEXTROSE 50 % IV SOLN: 0.0000 mL | INTRAVENOUS | Status: DC | PRN

## 2019-12-04 MED ORDER — SODIUM CHLORIDE 0.9 % IV SOLN: INTRAVENOUS | Status: DC

## 2019-12-04 MED ORDER — DEXTROSE-NACL 5-0.45 % IV SOLN: INTRAVENOUS | Status: DC

## 2019-12-04 MED ORDER — MIRABEGRON ER 25 MG PO TB24
50.0000 mg | ORAL_TABLET | Freq: Every day | ORAL | Status: DC
  Administered 2019-12-05 – 2019-12-06 (×2): 50 mg via ORAL
  Filled 2019-12-04 (×3): qty 1

## 2019-12-04 MED ORDER — POTASSIUM CHLORIDE 10 MEQ/100ML IV SOLN
10.0000 meq | INTRAVENOUS | Status: DC
  Administered 2019-12-04: 10 meq via INTRAVENOUS
  Filled 2019-12-04: qty 100

## 2019-12-04 MED ORDER — MIRTAZAPINE 15 MG PO TABS
15.0000 mg | ORAL_TABLET | Freq: Every day | ORAL | Status: DC
  Administered 2019-12-04 – 2019-12-05 (×2): 15 mg via ORAL
  Filled 2019-12-04 (×2): qty 1

## 2019-12-04 MED ORDER — ATORVASTATIN CALCIUM 40 MG PO TABS
40.0000 mg | ORAL_TABLET | Freq: Every day | ORAL | Status: DC
  Administered 2019-12-05: 19:00:00 40 mg via ORAL
  Filled 2019-12-04: qty 1

## 2019-12-04 MED ORDER — POTASSIUM CHLORIDE 10 MEQ/100ML IV SOLN
10.0000 meq | INTRAVENOUS | Status: AC
  Administered 2019-12-05 (×2): 10 meq via INTRAVENOUS
  Filled 2019-12-04: qty 100

## 2019-12-04 MED ORDER — BUPROPION HCL 75 MG PO TABS
75.0000 mg | ORAL_TABLET | Freq: Two times a day (BID) | ORAL | Status: DC
  Administered 2019-12-05 – 2019-12-06 (×3): 75 mg via ORAL
  Filled 2019-12-04 (×5): qty 1

## 2019-12-04 MED ORDER — SODIUM CHLORIDE 0.9 % IV BOLUS
2000.0000 mL | Freq: Once | INTRAVENOUS | Status: AC
  Administered 2019-12-04: 13:00:00 2000 mL via INTRAVENOUS

## 2019-12-04 MED ORDER — BENZTROPINE MESYLATE 1 MG PO TABS
1.0000 mg | ORAL_TABLET | Freq: Every day | ORAL | Status: DC
  Administered 2019-12-04 – 2019-12-05 (×2): 1 mg via ORAL
  Filled 2019-12-04 (×2): qty 1

## 2019-12-04 MED ORDER — ENOXAPARIN SODIUM 40 MG/0.4ML ~~LOC~~ SOLN
40.0000 mg | SUBCUTANEOUS | Status: DC
  Administered 2019-12-05 (×2): 40 mg via SUBCUTANEOUS
  Filled 2019-12-04 (×2): qty 0.4

## 2019-12-04 MED ORDER — INSULIN REGULAR(HUMAN) IN NACL 100-0.9 UT/100ML-% IV SOLN: INTRAVENOUS | Status: DC

## 2019-12-04 MED ORDER — ARIPIPRAZOLE 5 MG PO TABS
15.0000 mg | ORAL_TABLET | Freq: Every day | ORAL | Status: DC
  Administered 2019-12-05 – 2019-12-06 (×2): 15 mg via ORAL
  Filled 2019-12-04 (×2): qty 3

## 2019-12-04 MED ORDER — INSULIN REGULAR(HUMAN) IN NACL 100-0.9 UT/100ML-% IV SOLN
INTRAVENOUS | Status: DC
  Administered 2019-12-04: 19:00:00 9 [IU]/h via INTRAVENOUS
  Filled 2019-12-04: qty 100

## 2019-12-04 NOTE — ED Triage Notes (Signed)
Brought in by EMS for glucose of 600.  Unresponsive and lisulin pump fell off 2 days ago.  Need SW d/t housing conditions.

## 2019-12-04 NOTE — ED Provider Notes (Signed)
[facility]Y DEPARTMENT Provider Note   CSN: [id number] Arrival date & time: 12/04/19  1236     History Chief Complaint  Patient presents with  . Hyperglycemia    Narda RutherfordDeborah A Pesci is a 69 y.o. female with a history of insulin dependent T2DM, prior SVC thrombosis, CHF, & prior cardiac arrest who presents to the ED via EMS for AMS & hyperglycemia.   History obtained from family & EMS.  I called & spoke with patient's sister Woody SellerCarol Johnson ([phone]) who states patient was last spoken to/seen normal > 48 hours ago. She states a few days ago when she spoke with her on the phone she seemed a bit confused consistent with prior confusion with hyperglycemia. She encouraged patient to get evaluated but states the patient and her boyfriend are often hesitant to come to the ED. Today the patient's boyfriend woke up and noted the patient to have waxing/waning levels of consciousness & noted her insulin pump to have fallen off therefore 911 was called. Per EMS CBG > 600, unsanitary housing conditions were noted. Patient is able to tell me her name, that it is 2019, and that she is not currently having any pain.   HPI     Past Medical History:  Diagnosis Date  . Allergy   . Diabetes mellitus without complication (HCC)   . Mixed stress and urge urinary incontinence   . Pneumonia   . Schizophrenia [facility](HCC)     Patient Active Problem List   Diagnosis Date Noted  . Acute renal failure (ARF) (HCC) 04/19/2017  . Acute on chronic systolic CHF (congestive heart failure) (HCC) 04/19/2017  . Acute respiratory acidosis   . Arterial hypotension   . DKA (diabetic ketoacidoses) (HCC) 04/15/2017  . Thrombocytopenia (HCC) 09/26/2016  . Pressure injury of skin 09/25/2016  . Hyperkalemia 09/24/2016  . Hyponatremia 09/24/2016  . Metabolic acidosis 09/24/2016  . DKA, type 2 (HCC) 09/24/2016  . Pneumonia due to Staphylococcus (HCC) 11/08/2014  . 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 CVA (cerebral infarction) 11/08/2014  . Chronic systolic congestive heart failure (HCC) 11/08/2014  . Anemia of chronic disease 11/08/2014  . Dysphagia 11/08/2014  . Schizophrenia (HCC) 11/08/2014  . Bacteremia due to Staphylococcus aureus   . Acute respiratory failure (HCC)   . Dehydration   . Community acquired pneumonia   . Anoxic brain injury (HCC)   . Acute respiratory failure with hypoxia (HCC)   . AKI (acute kidney injury) (HCC)   . Septic shock (HCC) 10/02/2014  . Encephalopathy acute 10/02/2014  . Altered mental status 10/01/2014  . Foreign body alimentary tract 10/01/2014  . Shock (HCC)   . Cardiac arrest (HCC)   . Foreign body ingestion   . Diabetes (HCC) 05/22/2014  . Diabetic ketoacidosis associated with type 2 diabetes mellitus (HCC) 05/22/2014  . Constipation 05/22/2014  . Mood disorder (HCC) 05/22/2014    History reviewed. No pertinent surgical history.   OB History    Gravida  0   Para      Term      Preterm      AB      Living        SAB      TAB      Ectopic      Multiple      Live Births              Family History  Problem Relation Age of Onset  . Heart disease  Mother   . Hypertension Father   . Stroke Father   . Heart disease Father   . Diabetes Father   . Emphysema Paternal Grandfather     Social History   Tobacco Use  . Smoking status: Former Smoker    Packs/day: 3.00    Types: Cigarettes  . Smokeless tobacco: Never Used  Substance Use Topics  . Alcohol use: Yes    Alcohol/week: 6.0 standard drinks    Types: 6 Cans of beer per week  . Drug use: No    Home Medications Prior to Admission medications   Medication Sig Start Date End Date Taking? Authorizing Provider  ARIPiprazole (ABILIFY) 15 MG tablet Take 15 mg by mouth daily.    [provider]  atorvastatin (LIPITOR) 10 MG tablet Take 1 tablet (10 mg total) by mouth daily. Patient taking differently: Take 40 mg by mouth daily at 6 PM.  10/13/14    Donita Brooks, NP  benztropine (COGENTIN) 1 MG tablet Take 1 mg by mouth at bedtime.     [provider]  buPROPion (WELLBUTRIN) 75 MG tablet Take 75 mg by mouth 2 (two) times daily.    [provider]  ferrous sulfate 325 (65 FE) MG tablet Take 325 mg by mouth daily with breakfast.    [provider]  furosemide (LASIX) 40 MG tablet Take 1 tablet (40 mg total) by mouth daily. 04/26/17   Patrecia Pour, MD  insulin aspart (NOVOLOG) 100 UNIT/ML injection Inject three times daily with meals.  Correction coverage: Moderate (average weight, post-op) CBG < 70: implement hypoglycemia protocol CBG 70 - 120: 0 units CBG 121 - 150: 2 units CBG 151 - 200: 3 units CBG 201 - 250: 5 units CBG 251 - 300: 8 units CBG 301 - 350: 11 units CBG 351 - 400: 15 units CBG > 400: call MD and obtain STAT lab verification 04/25/17   Patrecia Pour, MD  insulin detemir (LEVEMIR) 100 UNIT/ML injection Inject 0.2 mLs (20 Units total) into the skin at bedtime. 09/27/16   Velvet Bathe, MD  mirabegron ER (MYRBETRIQ) 50 MG TB24 tablet Take 50 mg by mouth daily.    [provider]  mirtazapine (REMERON) 15 MG tablet Take 15 mg by mouth at bedtime.    [provider]    Allergies    Penicillins  Review of Systems   Review of Systems  Unable to perform ROS: Mental status change    Physical Exam Updated Vital Signs BP (!) 115/93 (BP Location: Left Arm)   Pulse 91   Temp (!) 97.4 F (36.3 C)   Resp (!) 22   Ht 5\' 6"  (1.676 m)   Wt 63.5 kg   SpO2 100% Comment: NRB removed to test Sats per PA.  BMI 22.60 kg/m   Physical Exam Vitals and nursing note reviewed.  Constitutional:      Appearance: She is ill-appearing.  HENT:     Head: Normocephalic and atraumatic. No raccoon eyes or Battle's sign.     Mouth/Throat:     Mouth: Mucous membranes are dry.  Eyes:     Pupils: Pupils are equal, round, and reactive to light.  Cardiovascular:     Rate and Rhythm: Normal rate  and regular rhythm.     Pulses:          Radial pulses are 2+ on the right side and 2+ on the left side.  Pulmonary:     Effort: Tachypnea (mild)  present.     Breath sounds: No wheezing, rhonchi or rales.     Comments: Initially on 12L NRB, removed- SpO2 maintained @ 100% on RA with good wave form.  Chest:     Chest wall: No tenderness or crepitus.  Abdominal:     General: There is no distension.     Palpations: Abdomen is soft.     Tenderness: There is no abdominal tenderness. There is no guarding or rebound.  Musculoskeletal:     Cervical back: Neck supple. No rigidity.     Right lower leg: No edema.     Left lower leg: No edema.  Skin:    General: Skin is warm and dry.  Neurological:     Mental Status: She is alert.     Comments: Alert, speech somewhat slurred, oriented to person, year, & location of hospital- did not ask further questions based on acuity of condition on initial assessment. Following some commands. Symmetric grip strength. Moving all extremities.      ED Results / Procedures / Treatments   Labs (all labs ordered are listed, but only abnormal results are displayed) Labs Reviewed  CBG MONITORING, ED - Abnormal; Notable for the following components:      Result Value   Glucose-Capillary >600 (*)    All other components within normal limits  BETA-HYDROXYBUTYRIC ACID  BETA-HYDROXYBUTYRIC ACID  CBC WITH DIFFERENTIAL/PLATELET  URINALYSIS, ROUTINE W REFLEX MICROSCOPIC  BLOOD GAS, VENOUS  COMPREHENSIVE METABOLIC PANEL  ETHANOL  RAPID URINE DRUG SCREEN, HOSP PERFORMED    EKG EKG Interpretation  Date/Time:  Friday December 04 2019 12:53:23 EST Ventricular Rate:  94 PR Interval:    QRS Duration: 97 QT Interval:  375 QTC Calculation: 469 R Axis:   2 Text Interpretation: Sinus rhythm Probable left atrial enlargement Anterior infarct, old Confirmed by Bethann Berkshire [phone]) on 12/04/2019 5:24:05 PM   Radiology CT Head Wo Contrast  Result Date:  12/04/2019 CLINICAL DATA:  Encephalopathy, elevated blood glucose EXAM: CT HEAD WITHOUT CONTRAST TECHNIQUE: Contiguous axial images were obtained from the base of the skull through the vertex without intravenous contrast. COMPARISON:  CT head 09/24/2016 FINDINGS: Brain: Stable gliosis involving the right occipital lobe compatible with prior infarct. No evidence of acute infarction, hemorrhage, hydrocephalus, extra-axial collection or mass lesion/mass effect. Symmetric prominence of the ventricles, cisterns and sulci compatible with parenchymal volume loss. Patchy areas of white matter hypoattenuation are most compatible with chronic microvascular angiopathy. Vascular: Atherosclerotic calcification of the carotid siphons and intradural vertebral arteries. No hyperdense vessel. Skull: Benign hyperostosis frontalis interna, similar to prior. No calvarial fracture or suspicious osseous lesion. No scalp swelling or hematoma. Sinuses/Orbits: Paranasal sinuses and mastoid air cells are predominantly clear. Orbital structures are unremarkable aside from prior lens extractions. Other: Mild TMJ arthrosis. Extensive debris in both external auditory canals. IMPRESSION: 1. No acute intracranial abnormality. 2. Stable remote right occipital lobe infarct. 3. Stable parenchymal volume loss and chronic microvascular angiopathy. 4. Debris in the external auditory canals. Correlate with visual inspection to exclude a cerumen impaction. Electronically Signed   By: Kreg Shropshire M.D.   On: 12/04/2019 16:45   DG Chest Port 1 View  Result Date: 12/04/2019 CLINICAL DATA:  Patient unresponsive. Insulin pump fell off 2 days ago. Glucose measured at 600. EXAM: PORTABLE CHEST 1 VIEW COMPARISON:  04/19/2017. FINDINGS: Cardiac silhouette normal in size. No mediastinal or hilar masses. No evidence of adenopathy. Small focus of opacity in the left mid lung is consistent with scarring. Lungs  otherwise clear. No pleural effusion or pneumothorax.  Skeletal structures are grossly intact. IMPRESSION: No acute cardiopulmonary disease. Electronically Signed   By: Amie Portland M.D.   On: 12/04/2019 13:24    Procedures .Critical Care Performed by: Cherly Anderson, PA-C Authorized by: Cherly Anderson, PA-C    CRITICAL CARE Performed by: Harvie Heck   Total critical care time: 45 minutes  Critical care time was exclusive of separately billable procedures and treating other patients.  Critical care was necessary to treat or prevent imminent or life-threatening deterioration.  Critical care was time spent personally by me on the following activities: development of treatment plan with patient and/or surrogate as well as nursing, discussions with consultants, evaluation of patient's response to treatment, examination of patient, obtaining history from patient or surrogate, ordering and performing treatments and interventions, ordering and review of laboratory studies, ordering and review of radiographic studies, pulse oximetry and re-evaluation of patient's condition.   (including critical care time)  Medications Ordered in ED Medications  sodium chloride 0.9 % bolus 2,000 mL (2,000 mLs Intravenous New Bag/Given 12/04/19 1311)    ED Course  I have reviewed the triage vital signs and the nursing notes.  Pertinent labs & imaging results that were available during my care of the patient were reviewed by me and considered in my medical decision making (see chart for details).    SYLVANNA BURGGRAF was evaluated in Emergency Department on 12/04/2019 for the symptoms described in the history of present illness. He/she was evaluated in the context of the global COVID-19 pandemic, which necessitated consideration that the patient might be at risk for infection with the SARS-CoV-2 virus that causes COVID-19. Institutional protocols and algorithms that pertain to the evaluation of patients at risk for COVID-19 are in a state  of rapid change based on information released by regulatory bodies including the CDC and federal and state organizations. These policies and algorithms were followed during the patient's care in the ED.  MDM Rules/Calculators/A&P                      Patient presents to the ED via EMS for hyperglycemia & AMS.  On arrival patient taken off supplemental oxygen with maintenance of SpO2 on RA, vitals without significant abnormality, mild tachypnea present. Alert, able to answer simple questions, following some commands, speech seems mildly slurred. > 48 hours from onset- not code stroke candidate. No signs of trauma. MM are very dry- primary concern is for metabolic encephalopathy related to DKA, additional DDX: Infectious process, ischemic/hemorrhagic CVA, intoxication (drugs/EtOH).  Given patient acuity of condition immediately staffed with supervising physician Dr. Judd Lien who was at bedside shortly after my assessment.  14:45: Fluids have been started w/ 2L NS ordered. Discussed with nursing regarding blood draw- plan to go in patient's room next.   CBC: No leukocytosis.  No significant anemia. CMP: Findings consistent with DKA, glucose 449, bicarb 7, anion gap greater than 20.  Renal function improved from prior labs on record.  No significant electrolyte derangement. CT head without acute process.  Patient in DKA, insulin drip started using Endo tool.  Additional labs pending. She is more awake and talking, continues to protect her airway, made statements about wanting to leave- educated on need for admission and was redirectable, does not seem to have capacity to make own decision currently.  Will consult hospitalist service for admission.  Findings and plan of care discussed with supervising physician Dr. Judd Lien who has  evaluated the patient & is in agreement.   17:34: CONSULT: Discussed with hospitalist Dr. Adrian Blackwater- accepts admission.   Final Clinical Impression(s) / ED Diagnoses Final  diagnoses:  Diabetic ketoacidosis without coma associated with type 2 diabetes mellitus (HCC)  Metabolic encephalopathy    Rx / DC Orders ED Discharge Orders    None       Cherly Anderson, PA-C 12/04/19 1735    Geoffery Lyons, MD 12/05/19 [phone]

## 2019-12-04 NOTE — ED Notes (Signed)
Patient is altered. Patient keeps trying to get up out of bed and crawl on the floor. Patient put back into bed by this nurse.

## 2019-12-04 NOTE — H&P (Signed)
History and Physical  [name]:[id number] DOB: Oct 18, 1951 DOA: 12/04/2019  Referring physician: Harvie Heck, PA-C, ED physician PCP: Lindley Magnus, PA-C  Outpatient Specialists: none  Patient Coming From: home  Chief Complaint: AMS  HPI: [name] is a 69 y.o. female with a history of schizophrenia, type 2 diabetes on insulin pump, hyperlipidemia.  Patient has been having elevated blood sugars with increasing confusion over the past 48 hours.  It appears that the patient's insulin pump has fallen off.  She is unable to provide any history.  Her symptoms have been worsening.  Emergency Department Course: Initial CBG greater than 600.  Labs consistent with DKA with elevated beta hydroxybutyric acid.  Insulin drip started  Review of Systems:  Unable to provide  Past Medical History:  Diagnosis Date  . Allergy   . Diabetes mellitus without complication (HCC)   . Mixed stress and urge urinary incontinence   . Pneumonia   . Schizophrenia (HCC)    History reviewed. No pertinent surgical history. Social History:  reports that she has quit smoking. Her smoking use included cigarettes. She smoked 3.00 packs per day. She has never used smokeless tobacco. She reports current alcohol use of about 6.0 standard drinks of alcohol per week. She reports that she does not use drugs. Patient lives at home  Allergies  Allergen Reactions  . Penicillins     Rash     Family History  Problem Relation Age of Onset  . Heart disease Mother   . Hypertension Father   . Stroke Father   . Heart disease Father   . Diabetes Father   . Emphysema Paternal Grandfather       Prior to Admission medications   Medication Sig Start Date End Date Taking? Authorizing Provider  ARIPiprazole (ABILIFY) 15 MG tablet Take 15 mg by mouth daily.    [provider]  atorvastatin (LIPITOR) 10 MG tablet Take 1 tablet (10 mg total) by mouth daily. Patient taking differently:  Take 40 mg by mouth daily at 6 PM.  10/13/14   Jeanella Craze, NP  benztropine (COGENTIN) 1 MG tablet Take 1 mg by mouth at bedtime.     [provider]  buPROPion (WELLBUTRIN) 75 MG tablet Take 75 mg by mouth 2 (two) times daily.    [provider]  ferrous sulfate 325 (65 FE) MG tablet Take 325 mg by mouth daily with breakfast.    [provider]  furosemide (LASIX) 40 MG tablet Take 1 tablet (40 mg total) by mouth daily. 04/26/17   Tyrone Nine, MD  insulin aspart (NOVOLOG) 100 UNIT/ML injection Inject three times daily with meals.  Correction coverage: Moderate (average weight, post-op) CBG < 70: implement hypoglycemia protocol CBG 70 - 120: 0 units CBG 121 - 150: 2 units CBG 151 - 200: 3 units CBG 201 - 250: 5 units CBG 251 - 300: 8 units CBG 301 - 350: 11 units CBG 351 - 400: 15 units CBG > 400: call MD and obtain STAT lab verification 04/25/17   Tyrone Nine, MD  insulin detemir (LEVEMIR) 100 UNIT/ML injection Inject 0.2 mLs (20 Units total) into the skin at bedtime. 09/27/16   Penny Pia, MD  mirabegron ER (MYRBETRIQ) 50 MG TB24 tablet Take 50 mg by mouth daily.    [provider]  mirtazapine (REMERON) 15 MG tablet Take 15 mg by mouth at bedtime.    [provider]    Physical Exam: BP Marland Kitchen)  115/93 (BP Location: Left Arm)   Pulse 91   Temp (!) 97.4 F (36.3 C)   Resp (!) 22   Ht 5\' 6"  (1.676 m)   Wt 63.5 kg   SpO2 100% Comment: NRB removed to test Sats per PA.  BMI 22.60 kg/m   . General: Elderly female. Awake and alert, but significantly confused. No acute cardiopulmonary distress.  Marland Kitchen HEENT: Normocephalic atraumatic.  Right and left ears normal in appearance.  Pupils equal, round, reactive to light. Extraocular muscles are intact. Sclerae anicteric and noninjected.  Moist mucosal membranes. No mucosal lesions.  . Neck: Neck supple without lymphadenopathy. No carotid bruits. No masses palpated.  . Cardiovascular: Regular rate  with normal S1-S2 sounds. No murmurs, rubs, gallops auscultated. No JVD.  Marland Kitchen Respiratory: Good respiratory effort with no wheezes, rales, rhonchi. Lungs clear to auscultation bilaterally.  No accessory muscle use. . Abdomen: Soft, nontender, nondistended. Active bowel sounds. No masses or hepatosplenomegaly  . Skin: No rashes, lesions, or ulcerations.  Dry, warm to touch. 2+ dorsalis pedis and radial pulses. . Musculoskeletal: No calf or leg pain. All major joints not erythematous nontender.  No upper or lower joint deformation.  Good ROM.  No contractures  . Psychiatric: Intact judgment and insight. Pleasant and cooperative. . Neurologic: No focal neurological deficits. Strength is 5/5 and symmetric in upper and lower extremities.  Cranial nerves II through XII are grossly intact.           Labs on Admission: I have personally reviewed following labs and imaging studies  CBC: Recent Labs  Lab 12/04/19 1616  WBC 8.2  NEUTROABS 6.2  HGB 14.9  HCT 46.6*  MCV 81.0  PLT 106   Basic Metabolic Panel: Recent Labs  Lab 12/04/19 1616  NA 142  K 4.6  CL 113*  CO2 7*  GLUCOSE 449*  BUN 59*  CREATININE 1.67*  CALCIUM 9.0   GFR: Estimated Creatinine Clearance: 31 mL/min (A) (by C-G formula based on SCr of 1.67 mg/dL (H)). Liver Function Tests: Recent Labs  Lab 12/04/19 1616  AST 14*  ALT 17  ALKPHOS 141*  BILITOT 1.8*  PROT 8.1  ALBUMIN 4.5   No results for input(s): LIPASE, AMYLASE in the last 168 hours. No results for input(s): AMMONIA in the last 168 hours. Coagulation Profile: No results for input(s): INR, PROTIME in the last 168 hours. Cardiac Enzymes: No results for input(s): CKTOTAL, CKMB, CKMBINDEX, TROPONINI in the last 168 hours. BNP (last 3 results) No results for input(s): PROBNP in the last 8760 hours. HbA1C: No results for input(s): HGBA1C in the last 72 hours. CBG: Recent Labs  Lab 12/04/19 1254 12/04/19 1822  GLUCAP >600* 290*   Lipid Profile: No  results for input(s): CHOL, HDL, LDLCALC, TRIG, CHOLHDL, LDLDIRECT in the last 72 hours. Thyroid Function Tests: No results for input(s): TSH, T4TOTAL, FREET4, T3FREE, THYROIDAB in the last 72 hours. Anemia Panel: No results for input(s): VITAMINB12, FOLATE, FERRITIN, TIBC, IRON, RETICCTPCT in the last 72 hours. Urine analysis:    Component Value Date/Time   COLORURINE YELLOW 04/21/2017 1046   APPEARANCEUR HAZY (A) 04/21/2017 1046   LABSPEC 1.006 04/21/2017 1046   PHURINE 5.0 04/21/2017 1046   GLUCOSEU 50 (A) 04/21/2017 1046   HGBUR SMALL (A) 04/21/2017 1046   BILIRUBINUR NEGATIVE 04/21/2017 1046   BILIRUBINUR neg 05/22/2014 0935   KETONESUR NEGATIVE 04/21/2017 1046   PROTEINUR NEGATIVE 04/21/2017 1046   UROBILINOGEN 0.2 10/14/2014 0545   NITRITE NEGATIVE 04/21/2017 1046  LEUKOCYTESUR LARGE (A) 04/21/2017 1046   Sepsis Labs: @LABRCNTIP (procalcitonin:4,lacticidven:4) )No results found for this or any previous visit (from the past 240 hour(s)).   Radiological Exams on Admission: CT Head Wo Contrast  Result Date: 12/04/2019 CLINICAL DATA:  Encephalopathy, elevated blood glucose EXAM: CT HEAD WITHOUT CONTRAST TECHNIQUE: Contiguous axial images were obtained from the base of the skull through the vertex without intravenous contrast. COMPARISON:  CT head 09/24/2016 FINDINGS: Brain: Stable gliosis involving the right occipital lobe compatible with prior infarct. No evidence of acute infarction, hemorrhage, hydrocephalus, extra-axial collection or mass lesion/mass effect. Symmetric prominence of the ventricles, cisterns and sulci compatible with parenchymal volume loss. Patchy areas of white matter hypoattenuation are most compatible with chronic microvascular angiopathy. Vascular: Atherosclerotic calcification of the carotid siphons and intradural vertebral arteries. No hyperdense vessel. Skull: Benign hyperostosis frontalis interna, similar to prior. No calvarial fracture or suspicious  osseous lesion. No scalp swelling or hematoma. Sinuses/Orbits: Paranasal sinuses and mastoid air cells are predominantly clear. Orbital structures are unremarkable aside from prior lens extractions. Other: Mild TMJ arthrosis. Extensive debris in both external auditory canals. IMPRESSION: 1. No acute intracranial abnormality. 2. Stable remote right occipital lobe infarct. 3. Stable parenchymal volume loss and chronic microvascular angiopathy. 4. Debris in the external auditory canals. Correlate with visual inspection to exclude a cerumen impaction. Electronically Signed   By: 13/04/2016 M.D.   On: 12/04/2019 16:45   DG Chest Port 1 View  Result Date: 12/04/2019 CLINICAL DATA:  Patient unresponsive. Insulin pump fell off 2 days ago. Glucose measured at 600. EXAM: PORTABLE CHEST 1 VIEW COMPARISON:  04/19/2017. FINDINGS: Cardiac silhouette normal in size. No mediastinal or hilar masses. No evidence of adenopathy. Small focus of opacity in the left mid lung is consistent with scarring. Lungs otherwise clear. No pleural effusion or pneumothorax. Skeletal structures are grossly intact. IMPRESSION: No acute cardiopulmonary disease. Electronically Signed   By: 06/19/2017 M.D.   On: 12/04/2019 13:24    EKG: Independently reviewed.  There with enlarged left atrium.  No acute ST changes.  Assessment/Plan: Active Problems:   Diabetic ketoacidosis associated with type 2 diabetes mellitus (HCC)   Altered mental status   History of stroke   Schizophrenia (HCC)   DKA (diabetic ketoacidoses) (HCC)    This patient was discussed with the ED physician, including pertinent vitals, physical exam findings, labs, and imaging.  We also discussed care given by the ED provider.  DKA with type 2 diabetes  admit to stepdown  Telemetry monitoring  Continue aggressive IV hydration  CBGs every hour  Basic metabolic panel every 4 hours  Potassium replacement: 10 mEq IV runs 2  Noncaloric liquids  Transition  to D5 normal saline at 150 mL's per hour with CBG at 250  Transition to home insulin regimen when gap is closed and acidosis resolved.  Altered mental status  Secondary to DKA  History of stroke  No apparent deficits, although patient cannot follow command  Schizophrenia  Continue home regimen  DVT prophylaxis: Lovenox Consultants: None Code Status: Full code Family Communication: None Disposition Plan: Patient should be able to return home following stabilization and solution of DKA   12/06/2019, DO

## 2019-12-05 ENCOUNTER — Inpatient Hospital Stay: Payer: Self-pay

## 2019-12-05 DIAGNOSIS — N179 Acute kidney failure, unspecified: Secondary | ICD-10-CM

## 2019-12-05 DIAGNOSIS — G9341 Metabolic encephalopathy: Secondary | ICD-10-CM

## 2019-12-05 DIAGNOSIS — I5022 Chronic systolic (congestive) heart failure: Secondary | ICD-10-CM

## 2019-12-05 DIAGNOSIS — E1111 Type 2 diabetes mellitus with ketoacidosis with coma: Secondary | ICD-10-CM

## 2019-12-05 LAB — HIV ANTIBODY (ROUTINE TESTING W REFLEX): HIV Screen 4th Generation wRfx: NONREACTIVE

## 2019-12-05 LAB — CBG MONITORING, ED
Glucose-Capillary: 107 mg/dL — ABNORMAL HIGH (ref 70–99)
Glucose-Capillary: 124 mg/dL — ABNORMAL HIGH (ref 70–99)
Glucose-Capillary: 129 mg/dL — ABNORMAL HIGH (ref 70–99)
Glucose-Capillary: 165 mg/dL — ABNORMAL HIGH (ref 70–99)
Glucose-Capillary: 168 mg/dL — ABNORMAL HIGH (ref 70–99)
Glucose-Capillary: 169 mg/dL — ABNORMAL HIGH (ref 70–99)
Glucose-Capillary: 209 mg/dL — ABNORMAL HIGH (ref 70–99)
Glucose-Capillary: 230 mg/dL — ABNORMAL HIGH (ref 70–99)
Glucose-Capillary: 72 mg/dL (ref 70–99)
Glucose-Capillary: 78 mg/dL (ref 70–99)
Glucose-Capillary: 79 mg/dL (ref 70–99)
Glucose-Capillary: 80 mg/dL (ref 70–99)
Glucose-Capillary: 90 mg/dL (ref 70–99)

## 2019-12-05 LAB — BASIC METABOLIC PANEL
Anion gap: 8 (ref 5–15)
Anion gap: 5 (ref 5–15)
BUN: 43 mg/dL — ABNORMAL HIGH (ref 8–23)
BUN: 33 mg/dL — ABNORMAL HIGH (ref 8–23)
CO2: 16 mmol/L — ABNORMAL LOW (ref 22–32)
CO2: 17 mmol/L — ABNORMAL LOW (ref 22–32)
Calcium: 8.4 mg/dL — ABNORMAL LOW (ref 8.9–10.3)
Calcium: 8.3 mg/dL — ABNORMAL LOW (ref 8.9–10.3)
Chloride: 120 mmol/L — ABNORMAL HIGH (ref 98–111)
Chloride: 116 mmol/L — ABNORMAL HIGH (ref 98–111)
Creatinine, Ser: 0.96 mg/dL (ref 0.44–1.00)
Creatinine, Ser: 0.7 mg/dL (ref 0.44–1.00)
GFR calc Af Amer: >60 mL/min (ref >60)
GFR calc Af Amer: >60 mL/min (ref >60)
GFR calc non Af Amer: >60 mL/min (ref >60)
GFR calc non Af Amer: >60 mL/min (ref >60)
Glucose, Bld: 145 mg/dL — ABNORMAL HIGH (ref 70–99)
Glucose, Bld: 277 mg/dL — ABNORMAL HIGH (ref 70–99)
Potassium: 3.9 mmol/L (ref 3.5–5.1)
Potassium: 3.2 mmol/L — ABNORMAL LOW (ref 3.5–5.1)
Sodium: 144 mmol/L (ref 135–145)
Sodium: 138 mmol/L (ref 135–145)

## 2019-12-05 LAB — FOLATE: Folate: 11.6 ng/mL (ref >5.9)

## 2019-12-05 LAB — T4, FREE: Free T4: 1 ng/dL (ref 0.61–1.12)

## 2019-12-05 LAB — SARS CORONAVIRUS 2 (TAT 6-24 HRS): SARS Coronavirus 2: NEGATIVE

## 2019-12-05 LAB — HEMOGLOBIN A1C
Hgb A1c MFr Bld: 11.8 % — ABNORMAL HIGH (ref 4.8–5.6)
Mean Plasma Glucose: 291.96 mg/dL

## 2019-12-05 LAB — VITAMIN B12: Vitamin B-12: 479 pg/mL (ref 180–914)

## 2019-12-05 LAB — AMMONIA: Ammonia: 24 umol/L (ref 9–35)

## 2019-12-05 LAB — MAGNESIUM: Magnesium: 2 mg/dL (ref 1.7–2.4)

## 2019-12-05 LAB — TSH: TSH: 0.546 u[IU]/mL (ref 0.350–4.500)

## 2019-12-05 MED ORDER — INSULIN ASPART 100 UNIT/ML ~~LOC~~ SOLN
0.0000 [IU] | Freq: Every day | SUBCUTANEOUS | Status: DC
  Administered 2019-12-05: 22:00:00 2 [IU] via SUBCUTANEOUS
  Filled 2019-12-05: qty 1

## 2019-12-05 MED ORDER — POTASSIUM CHLORIDE 10 MEQ/100ML IV SOLN
INTRAVENOUS | Status: AC
  Administered 2019-12-05: 10 meq via INTRAVENOUS
  Filled 2019-12-05: qty 100

## 2019-12-05 MED ORDER — INSULIN ASPART 100 UNIT/ML ~~LOC~~ SOLN
0.0000 [IU] | Freq: Three times a day (TID) | SUBCUTANEOUS | Status: DC
  Administered 2019-12-05: 1 [IU] via SUBCUTANEOUS
  Administered 2019-12-05: 18:00:00 3 [IU] via SUBCUTANEOUS
  Administered 2019-12-06: 08:00:00 5 [IU] via SUBCUTANEOUS
  Filled 2019-12-05 (×3): qty 1

## 2019-12-05 MED ORDER — POTASSIUM CHLORIDE CRYS ER 20 MEQ PO TBCR
40.0000 meq | EXTENDED_RELEASE_TABLET | Freq: Once | ORAL | Status: AC
  Administered 2019-12-05: 40 meq via ORAL
  Filled 2019-12-05: qty 2

## 2019-12-05 MED ORDER — POTASSIUM CHLORIDE IN NACL 20-0.9 MEQ/L-% IV SOLN
INTRAVENOUS | Status: AC
  Filled 2019-12-05 (×2): qty 1000

## 2019-12-05 MED ORDER — HYDROXYZINE HCL 25 MG PO TABS: 25.0000 mg | ORAL_TABLET | Freq: Every evening | ORAL | Status: DC | PRN

## 2019-12-05 MED ORDER — INSULIN GLARGINE 100 UNIT/ML ~~LOC~~ SOLN
5.0000 [IU] | Freq: Every day | SUBCUTANEOUS | Status: DC
  Administered 2019-12-05 – 2019-12-06 (×2): 5 [IU] via SUBCUTANEOUS
  Filled 2019-12-05 (×3): qty 0.05

## 2019-12-05 NOTE — ED Notes (Signed)
Pt was given lunch tray.  

## 2019-12-05 NOTE — ED Notes (Signed)
Lab draw from [facility]   Flushed with NS flush 10 cc

## 2019-12-05 NOTE — ED Notes (Signed)
Meal provided 

## 2019-12-05 NOTE — ED Notes (Signed)
avasyst aware needing services.

## 2019-12-05 NOTE — Progress Notes (Signed)
Spoke with primary nurse concerning PICC order. She was informed that we do not provide PICC services on weekends. She was instructed to call [facility] for PICC placement.

## 2019-12-05 NOTE — ED Notes (Signed)
Pt talked with sister on phone. Pt upset stating she was mad at me for calling the police on her. Attempted to reorient. Sister helped calm pt down. avasyst states will take a minute to get machine set up to sit with pt

## 2019-12-05 NOTE — ED Notes (Signed)
Pt eating breakfast per Dr Tat vo . Read back and verified. Pt more relaxed and not upset anymore. avasys in progress

## 2019-12-05 NOTE — Progress Notes (Signed)
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  Apparently, the patient's sister spoke with her approximately 2 days prior to this admission.  The patient seemed to be a little confused at that time.  However on 12/04/2019, the patient's boyfriend with whom she lives woke up and noted the patient to have waxing waning episodes of altered consciousness.  Apparently, he had noted that the patient's V-GO insulin pump had fallen off and put it back on.  EMS was activated.  At that time CBG was noted to be >600.  Otherwise, there was no report of fevers, chills, shortness of breath, vomiting, diarrhea.   In the emergency department, the patient was afebrile hemodynamically stable with oxygen saturation 100% room air.  She was confused and following only simple commands.  Initial serum glucose was 449 with anion gap > 20.  Serum creatinine was 1.67.  Urinalysis showed ketonuria.  CBC was unremarkable.  Chest x-ray was negative.  CT of the brain was negative for acute findings.  The patient was started on insulin drip and IV fluids.  Assessment/Plan: DKA type II -patient started on IV insulin with q 1 hour CBG check and q 4 hour BMPs -pt started on aggressive fluid resuscitation -Electrolytes were monitored and repleted -transitioned to [place] insulin once anion gap closed -diet was advanced once anion gap closed -HbA1C--pending  Uncontrolled diabetes mellitus type 2 with hyperglycemia -Apparently, the patient stated she does not give herself mealtime boluses of insulin as directed -Hemoglobin A1c--pending -Start reduced dose Lantus -NovoLog sliding scale -Continue judicious IV fluids  Chronic systolic CHF -Patient  appears hypovolemic -04/16/2017 echo EF 35-40%, G1DD, diffuse HK  Acute kidney injury -Baseline creatinine 0.4-0.8 -Presented with serum creatinine 1.67 -Secondary to volume depletion -Continue judicious IV fluids  Acute metabolic encephalopathy -12/05/2019--the patient was much more alert and following commands and answering questions appropriately -Secondary to the patient's metabolic abnormalities/DKA -Check TSH -Serum B12 -Ammonia -Folic acid  Schizophrenia -Continue home dose of Abilify, Cogentin, Wellbutrin, Remeron  Hyperlipidemia -Continue statin       Disposition Plan:   Home in 1-2 days  Family Communication:   Attempted to call sister and significant other--could not leave VM  Consultants:  none  Code Status:  FULL   DVT Prophylaxis:  [place] Lovenox   Procedures: As Listed in Progress Note Above  Antibiotics: None      Subjective: Patient denies fevers, chills, headache, chest pain, dyspnea, nausea, vomiting, diarrhea, abdominal pain, dysuria, hematuria, hematochezia, and melena.   Objective: Vitals:   12/05/19 0230 12/05/19 0242 12/05/19 0400 12/05/19 0600  BP: (!) 108/96  (!) 122/57 (!) 114/47  Pulse:  85  84  Resp: 12 14 12 16   Temp:      SpO2:  99%  100%  Weight:      Height:        Intake/Output Summary (Last 24 hours) at 12/05/2019 1059 Last data filed at 12/05/2019 0533 Gross per 24 hour  Intake 2318.25 ml  Output 700 ml  Net 1618.25 ml   Weight change:  Exam:   General:  Pt is alert, follows commands appropriately, not in acute distress  HEENT: No icterus, No thrush, No neck mass, [place]/AT  Cardiovascular: RRR, S1/S2,  no rubs, no gallops  Respiratory: CTA bilaterally, no wheezing, no crackles, no rhonchi  Abdomen: Soft/+BS, non tender, non distended, no guarding  Extremities: No edema, No lymphangitis, No petechiae, No rashes, no synovitis   Data Reviewed: I have personally reviewed following labs and imaging  studies Basic Metabolic Panel: Recent Labs  Lab 12/04/19 1616 12/04/19 1849 12/04/19 2156 12/05/19 0241  NA 142 143 143 144  K 4.6 4.5 3.5 3.9  CL 113* 116* 118* 120*  CO2 7* 10* 15* 16*  GLUCOSE 449* 325* 195* 145*  BUN 59* 54* 48* 43*  CREATININE 1.67* 1.40* 1.07* 0.96  CALCIUM 9.0 8.8* 8.5* 8.4*   Liver Function Tests: Recent Labs  Lab 12/04/19 1616  AST 14*  ALT 17  ALKPHOS 141*  BILITOT 1.8*  PROT 8.1  ALBUMIN 4.5   No results for input(s): LIPASE, AMYLASE in the last 168 hours. No results for input(s): AMMONIA in the last 168 hours. Coagulation Profile: No results for input(s): INR, PROTIME in the last 168 hours. CBC: Recent Labs  Lab 12/04/19 1616  WBC 8.2  NEUTROABS 6.2  HGB 14.9  HCT 46.6*  MCV 81.0  PLT 201   Cardiac Enzymes: No results for input(s): CKTOTAL, CKMB, CKMBINDEX, TROPONINI in the last 168 hours. BNP: Invalid input(s): POCBNP CBG: Recent Labs  Lab 12/05/19 0637 12/05/19 0735 12/05/19 0856 12/05/19 0958 12/05/19 1055  GLUCAP 80 79 72 78 124*   HbA1C: No results for input(s): HGBA1C in the last 72 hours. Urine analysis:    Component Value Date/Time   COLORURINE YELLOW 12/04/2019 1942   APPEARANCEUR CLEAR 12/04/2019 1942   LABSPEC 1.021 12/04/2019 1942   PHURINE 5.0 12/04/2019 1942   GLUCOSEU >=500 (A) 12/04/2019 1942   HGBUR SMALL (A) 12/04/2019 1942   BILIRUBINUR NEGATIVE 12/04/2019 1942   BILIRUBINUR neg 05/22/2014 0935   KETONESUR 80 (A) 12/04/2019 1942   PROTEINUR 100 (A) 12/04/2019 1942   UROBILINOGEN 0.2 10/14/2014 0545   NITRITE NEGATIVE 12/04/2019 1942   LEUKOCYTESUR NEGATIVE 12/04/2019 1942   Sepsis Labs: @LABRCNTIP (procalcitonin:4,lacticidven:4) )No results found for this or any previous visit (from the past 240 hour(s)).   Scheduled Meds:  ARIPiprazole  15 mg Oral Daily   atorvastatin  40 mg Oral q1800   benztropine  1 mg Oral QHS   buPROPion  75 mg Oral BID   enoxaparin (LOVENOX) injection  40 mg  Subcutaneous Q24H   insulin aspart  0-5 Units Subcutaneous QHS   insulin aspart  0-9 Units Subcutaneous TID WC   insulin glargine  5 Units Subcutaneous Daily   mirabegron ER  50 mg Oral Daily   mirtazapine  15 mg Oral QHS   Continuous Infusions:  sodium chloride Stopped (12/04/19 2047)   dextrose 5 % and 0.45% NaCl 75 mL/hr at 12/04/19 2053   insulin Stopped (12/05/19 0639)    Procedures/Studies: CT Head Wo Contrast  Result Date: 12/04/2019 CLINICAL DATA:  Encephalopathy, elevated blood glucose EXAM: CT HEAD WITHOUT CONTRAST TECHNIQUE: Contiguous axial images were obtained from the base of the skull through the vertex without intravenous contrast. COMPARISON:  CT head 09/24/2016 FINDINGS: Brain: Stable gliosis involving the right occipital lobe compatible with prior infarct. No evidence of acute infarction, hemorrhage, hydrocephalus, extra-axial collection or mass lesion/mass effect. Symmetric prominence of the ventricles, cisterns and sulci compatible with parenchymal volume loss. Patchy areas of white matter hypoattenuation are most compatible with chronic microvascular angiopathy. Vascular: Atherosclerotic calcification of the carotid siphons and intradural vertebral arteries. No hyperdense vessel.  Skull: Benign hyperostosis frontalis interna, similar to prior. No calvarial fracture or suspicious osseous lesion. No scalp swelling or hematoma. Sinuses/Orbits: Paranasal sinuses and mastoid air cells are predominantly clear. Orbital structures are unremarkable aside from prior lens extractions. Other: Mild TMJ arthrosis. Extensive debris in both external auditory canals. IMPRESSION: 1. No acute intracranial abnormality. 2. Stable remote right occipital lobe infarct. 3. Stable parenchymal volume loss and chronic microvascular angiopathy. 4. Debris in the external auditory canals. Correlate with visual inspection to exclude a cerumen impaction. Electronically Signed   By: Lovena Le M.D.    On: 12/04/2019 16:45   DG Chest Port 1 View  Result Date: 12/04/2019 CLINICAL DATA:  Patient unresponsive. Insulin pump fell off 2 days ago. Glucose measured at 600. EXAM: PORTABLE CHEST 1 VIEW COMPARISON:  04/19/2017. FINDINGS: Cardiac silhouette normal in size. No mediastinal or hilar masses. No evidence of adenopathy. Small focus of opacity in the left mid lung is consistent with scarring. Lungs otherwise clear. No pleural effusion or pneumothorax. Skeletal structures are grossly intact. IMPRESSION: No acute cardiopulmonary disease. Electronically Signed   By: Lajean Manes M.D.   On: 12/04/2019 13:24    Orson Eva, DO  [facility] Pager [phone]  If 7PM-7AM, please contact night-coverage [email or web address] Password [email or web address] 12/05/2019, 10:59 AM   LOS: 1 day

## 2019-12-05 NOTE — ED Notes (Signed)
RN called TeleSitter to give report.

## 2019-12-05 NOTE — ED Notes (Signed)
Call to Darleen Crocker SO [phone]

## 2019-12-05 NOTE — ED Notes (Signed)
Vascular access here to place [facility]

## 2019-12-05 NOTE — Progress Notes (Signed)
Inpatient Diabetes Program Recommendations  AACE/ADA: New Consensus Statement on Inpatient Glycemic Control (2015)  Target Ranges:  Prepandial:   less than 140 mg/dL      Peak postprandial:   less than 180 mg/dL (1-2 hours)      Critically ill patients:  140 - 180 mg/dL   Lab Results  Component Value Date   GLUCAP 124 (H) 12/05/2019   HGBA1C 14.5 (H) 04/20/2017    Review of Glycemic Control  Diabetes history: DM1 Outpatient Diabetes medications: Levemir 20 units QHS, Novolog 0-15 units tidwc Current orders for Inpatient glycemic control: Transitioned from IV insulin to Lantus 5 units QD, Novolog 0-9 units tidwc and 0-5 units QHS  Previously had insulin pump - V-Go 20.  Inpatient Diabetes Program Recommendations:     Need updated BMET. Last one at 0241. HgbA1C - need update. Last one 14.5% on 04/20/17 Increase Lantus to 12 units QD Add meal coverage - Novolog 3 units tidwc if pt eats > 50% meal Will need to f/u with PCP for pump restart if appropriate.  Continue to follow.  Thank you. Ailene Ards, RD, LDN, CDE Inpatient Diabetes Coordinator [phone]

## 2019-12-05 NOTE — ED Notes (Signed)
Dr Tat aware needing basil insulin order due to bs 79

## 2019-12-05 NOTE — ED Notes (Signed)
Called [facility] at [phone] for a [facility] Line as requested.

## 2019-12-06 DIAGNOSIS — F203 Undifferentiated schizophrenia: Secondary | ICD-10-CM

## 2019-12-06 DIAGNOSIS — E1010 Type 1 diabetes mellitus with ketoacidosis without coma: Principal | ICD-10-CM

## 2019-12-06 LAB — COMPREHENSIVE METABOLIC PANEL
ALT: 15 U/L (ref 0–44)
AST: 16 U/L (ref 15–41)
Albumin: 3.1 g/dL — ABNORMAL LOW (ref 3.5–5.0)
Alkaline Phosphatase: 95 U/L (ref 38–126)
Anion gap: 4 — ABNORMAL LOW (ref 5–15)
BUN: 24 mg/dL — ABNORMAL HIGH (ref 8–23)
CO2: 17 mmol/L — ABNORMAL LOW (ref 22–32)
Calcium: 8.2 mg/dL — ABNORMAL LOW (ref 8.9–10.3)
Chloride: 116 mmol/L — ABNORMAL HIGH (ref 98–111)
Creatinine, Ser: 0.65 mg/dL (ref 0.44–1.00)
GFR calc Af Amer: >60 mL/min (ref >60)
GFR calc non Af Amer: >60 mL/min (ref >60)
Glucose, Bld: 318 mg/dL — ABNORMAL HIGH (ref 70–99)
Potassium: 3.6 mmol/L (ref 3.5–5.1)
Sodium: 137 mmol/L (ref 135–145)
Total Bilirubin: 0.8 mg/dL (ref 0.3–1.2)
Total Protein: 5.8 g/dL — ABNORMAL LOW (ref 6.5–8.1)

## 2019-12-06 LAB — CBG MONITORING, ED
Glucose-Capillary: 253 mg/dL — ABNORMAL HIGH (ref 70–99)
Glucose-Capillary: 249 mg/dL — ABNORMAL HIGH (ref 70–99)

## 2019-12-06 MED ORDER — INSULIN ASPART 100 UNIT/ML ~~LOC~~ SOLN: 3.0000 [IU] | Freq: Three times a day (TID) | SUBCUTANEOUS | Status: DC

## 2019-12-06 MED ORDER — INSULIN GLARGINE 100 UNIT/ML ~~LOC~~ SOLN
5.0000 [IU] | Freq: Once | SUBCUTANEOUS | Status: AC
  Administered 2019-12-06: 10:00:00 5 [IU] via SUBCUTANEOUS
  Filled 2019-12-06: qty 0.05

## 2019-12-06 MED ORDER — INSULIN GLARGINE 100 UNIT/ML ~~LOC~~ SOLN
10.0000 [IU] | Freq: Every day | SUBCUTANEOUS | Status: DC
  Filled 2019-12-06: qty 0.1

## 2019-12-06 NOTE — ED Notes (Signed)
Pt states she is hungry, advised pt will give the breakfast tray when she arrives. Nad. External cath in place

## 2019-12-06 NOTE — ED Notes (Signed)
Pt was given her breakfast tray

## 2019-12-06 NOTE — ED Notes (Signed)
avassist cancelled

## 2019-12-06 NOTE — Care Management (Addendum)
  LoginAboutGlossaryEspaol Back  Start a new Corporate treasurer Home health agency [facility] Quality rating: 4.5 stars Quality rating Patient survey rating: 5 stars Patient survey rating Office Location [address] [place], [place] [address] Phone Number [phone] Add to [employer] offered Ratings Details Services Offered Nursing care Yes Physical therapy Yes Occupational therapy Yes Speech therapy Yes Medical social services Yes Home health aide Yes Ratings Quality rating 4.5 stars Quality rating The quality rating shows how a home health agency compares to others on measurements of their performance, like how often the agency began their patient's care in a timely manner, or how often a patient got better at walking around. A rating of 3 to 3 stars means the agency performed about the same as most agencies. Learn how Medicare calculates this rating View Rating Details Patient survey rating 5 stars Patient survey rating The patient survey asks patients (or their family or friends) about their home health care, and if they would recommend that agency to someone else. The survey ratings can be used to compare agencies using a 5-star scale, with more stars indicating better quality care. Learn how the patient survey rating is measured View Survey Details Details Ownership type VOLUNTARY NON-PROFIT - PRIVATE Medicare certification date 08/21/1983 Data last updated: September 16, 2019 To explore and download home health agency data,visit the data catalog on CMS.gov Consider this when choosing a home health agency 11 questions to ask when choosing a home health agency How Medicare covers home health care Conditions treatedServices offeredRatingsQualityDetailsLocationClinicians & locationsGeneral informationGeneral info About MedicareMedicare Glossary Nondiscrimination/AccessibilityPrivacy PolicyPrivacy SettingLinking PolicyUsing this sitePlain  Writing Department of Health and Human Services A federal government website managed and paid for by the [place] Centers for [employer] and [employer]. [address] [place], [place], [place] [address] Medicare.gov

## 2019-12-06 NOTE — Discharge Summary (Addendum)
Physician Discharge Summary  [name] VOZ:[id number] DOB: 06-19-1951 DOA: 12/04/2019  PCP: Lindley Magnus, PA-C  Admit date: 12/04/2019 Discharge date: 12/06/2019  Admitted From: Home Disposition:  Home   Recommendations for Outpatient Follow-up:  1. Follow up with PCP in 1-2 weeks 2. 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  Apparently, the patient's sister spoke with her approximately 2 days prior to this admission.  The patient seemed to be a little confused at that time.  However on 12/04/2019, the patient's boyfriend with whom she lives woke up and noted the patient to have waxing waning episodes of altered consciousness.  Apparently, he had noted that the patient's V-GO (20 unit) insulin pump had fallen off and put it back on.  EMS was activated.  At that time CBG was noted to be >600.  Otherwise, there was no report of fevers, chills, shortness of breath, vomiting, diarrhea.   In the emergency department, the patient was afebrile hemodynamically stable with oxygen saturation 100% room air.  She was confused and following only simple commands.  Initial serum glucose was 449 with anion gap > 20.  Serum creatinine was 1.67.  Urinalysis showed ketonuria.  CBC was unremarkable.  Chest x-ray was negative.  CT of the brain was negative for acute findings.  The patient was started on insulin drip and IV fluids.  She improved clinically and was transitioned to subQ insulin.  Her diet was advanced which she tolerated  Discharge Diagnoses:  DKA type I -patient started on IV insulin with q 1 hour CBG check and q 4 hour BMPs -pt started  on aggressive fluid resuscitation -Electrolytes were monitored and repleted -transitioned to Speed insulin once anion gap closed -diet was advanced once anion gap closed -HbA1C--11.8 -pt has not been administering the appropriate insulin with her meals and snacks -she states she forgets to give her V-GO device the appropriate clicks when she eats -She and her significant other have been educated about giving 4 clicks (8 units) with meals and 2 clicks (4 units) with snacks -They were also educated about checking her CBGs more frequently.  -They assure me that she has a glucometer and appropriate supples -HHRN was set up to check up on her and provide education after discharge -She remains very high risk for poor outcome until she is able to show more accountability   Uncontrolled diabetes mellitus type 1 with hyperglycemia -Apparently, the patient stated she does not give herself mealtime boluses of insulin as directed -Hemoglobin A1c--11.8 -Started reduced dose Lantus -she and her significant other assure me she has the humalog insulin for her V-GO device -NovoLog sliding scale -Continue judicious IV fluids  Chronic systolic CHF -Patient appears hypovolemic -04/16/2017 echo EF 35-40%, G1DD, diffuse HK  Acute kidney injury -Baseline creatinine 0.4-0.8 -Presented with serum creatinine 1.67 -Secondary to volume depletion -Continue judicious IV fluids  Acute metabolic encephalopathy -12/05/2019--the patient was much more alert and following commands and answering questions appropriately -Secondary to the patient's metabolic abnormalities/DKA -Check TSH--0.546 -Serum B12--479 -Ammonia--24 -Folic acid-11.6 -12/06/19--mental status improved  Schizophrenia -Continue home dose of Abilify, Cogentin, Wellbutrin, Remeron  Hyperlipidemia -Continue statin    Discharge Instructions   Allergies as  of 12/06/2019      Reactions   Penicillins Rash   Did it involve swelling of the  face/tongue/throat, SOB, or low BP? Unknown Did it involve sudden or severe rash/hives, skin peeling, or any reaction on the inside of your mouth or nose? Unknown Did you need to seek medical attention at a hospital or doctor's office? Unknown When did it last happen? If all above answers are "NO", may proceed with cephalosporin use.      Medication List    TAKE these medications   ARIPiprazole 20 MG tablet Commonly known as: ABILIFY Take 20 mg by mouth daily.   atorvastatin 20 MG tablet Commonly known as: LIPITOR Take 20 mg by mouth daily.   benztropine 1 MG tablet Commonly known as: COGENTIN Take 1 mg by mouth at bedtime.   buPROPion 75 MG tablet Commonly known as: WELLBUTRIN Take 75 mg by mouth 2 (two) times daily.   hydrOXYzine 25 MG tablet Commonly known as: ATARAX/VISTARIL Take 25-50 mg by mouth at bedtime as needed for anxiety.   insulin aspart 100 UNIT/ML injection Commonly known as: novoLOG Inject three times daily with meals.  Correction coverage: Moderate (average weight, post-op) CBG < 70: implement hypoglycemia protocol CBG 70 - 120: 0 units CBG 121 - 150: 2 units CBG 151 - 200: 3 units CBG 201 - 250: 5 units CBG 251 - 300: 8 units CBG 301 - 350: 11 units CBG 351 - 400: 15 units CBG > 400: call MD and obtain STAT lab verification What changed:   how to take this  when to take this  additional instructions   metoprolol succinate 25 MG 24 hr tablet Commonly known as: TOPROL-XL Take 12.5 mg by mouth daily.   mirtazapine 30 MG tablet Commonly known as: REMERON Take 30 mg by mouth at bedtime.   Oysco 500 500 MG Tabs Generic drug: Oyster Shell Calcium Take 1 tablet by mouth daily.      Follow-up Information    [facility], [facility]. Call.   Specialty: Home Health Services Contact information: [address] [place] [address] [phone]          Allergies  Allergen Reactions  . Penicillins Rash    Did it  involve swelling of the face/tongue/throat, SOB, or low BP? Unknown Did it involve sudden or severe rash/hives, skin peeling, or any reaction on the inside of your mouth or nose? Unknown Did you need to seek medical attention at a hospital or doctor's office? Unknown When did it last happen? If all above answers are "NO", may proceed with cephalosporin use.     Consultations:  none   Procedures/Studies: CT Head Wo Contrast  Result Date: 12/04/2019 CLINICAL DATA:  Encephalopathy, elevated blood glucose EXAM: CT HEAD WITHOUT CONTRAST TECHNIQUE: Contiguous axial images were obtained from the base of the skull through the vertex without intravenous contrast. COMPARISON:  CT head 09/24/2016 FINDINGS: Brain: Stable gliosis involving the right occipital lobe compatible with prior infarct. No evidence of acute infarction, hemorrhage, hydrocephalus, extra-axial collection or mass lesion/mass effect. Symmetric prominence of the ventricles, cisterns and sulci compatible with parenchymal volume loss. Patchy areas of white matter hypoattenuation are most compatible with chronic microvascular angiopathy. Vascular: Atherosclerotic calcification of the carotid siphons and intradural vertebral arteries. No hyperdense vessel. Skull: Benign hyperostosis frontalis interna, similar to prior. No calvarial fracture or suspicious osseous lesion. No scalp swelling or hematoma. Sinuses/Orbits: Paranasal sinuses and mastoid air cells are predominantly clear. Orbital structures are  unremarkable aside from prior lens extractions. Other: Mild TMJ arthrosis. Extensive debris in both external auditory canals. IMPRESSION: 1. No acute intracranial abnormality. 2. Stable remote right occipital lobe infarct. 3. Stable parenchymal volume loss and chronic microvascular angiopathy. 4. Debris in the external auditory canals. Correlate with visual inspection to exclude a cerumen impaction. Electronically Signed   By: Kreg ShropshirePrice  DeHay M.D.    On: 12/04/2019 16:45   DG Chest Port 1 View  Result Date: 12/04/2019 CLINICAL DATA:  Patient unresponsive. Insulin pump fell off 2 days ago. Glucose measured at 600. EXAM: PORTABLE CHEST 1 VIEW COMPARISON:  04/19/2017. FINDINGS: Cardiac silhouette normal in size. No mediastinal or hilar masses. No evidence of adenopathy. Small focus of opacity in the left mid lung is consistent with scarring. Lungs otherwise clear. No pleural effusion or pneumothorax. Skeletal structures are grossly intact. IMPRESSION: No acute cardiopulmonary disease. Electronically Signed   By: Amie Portlandavid  Ormond M.D.   On: 12/04/2019 13:24   US EKG SITE RITE  Result Date: 12/05/2019 If Site Rite image not attached, placement could not be confirmed due to current cardiac rhythm.       Discharge Exam: Vitals:   12/06/19 1004 12/06/19 1030  BP:  (!) 115/100  Pulse: 86   Resp:    Temp:    SpO2: 99%    Vitals:   12/06/19 0930 12/06/19 1000 12/06/19 1004 12/06/19 1030  BP: (!) 113/49 (!) 120/50  (!) 115/100  Pulse:   86   Resp:      Temp:      TempSrc:      SpO2:   99%   Weight:      Height:        General: Pt is alert, awake, not in acute distress Cardiovascular: RRR, S1/S2 +, no rubs, no gallops Respiratory: CTA bilaterally, no wheezing, no rhonchi Abdominal: Soft, NT, ND, bowel sounds + Extremities: no edema, no cyanosis   The results of significant diagnostics from this hospitalization (including imaging, microbiology, ancillary and laboratory) are listed below for reference.    Significant Diagnostic Studies: CT Head Wo Contrast  Result Date: 12/04/2019 CLINICAL DATA:  Encephalopathy, elevated blood glucose EXAM: CT HEAD WITHOUT CONTRAST TECHNIQUE: Contiguous axial images were obtained from the base of the skull through the vertex without intravenous contrast. COMPARISON:  CT head 09/24/2016 FINDINGS: Brain: Stable gliosis involving the right occipital lobe compatible with prior infarct. No evidence of  acute infarction, hemorrhage, hydrocephalus, extra-axial collection or mass lesion/mass effect. Symmetric prominence of the ventricles, cisterns and sulci compatible with parenchymal volume loss. Patchy areas of white matter hypoattenuation are most compatible with chronic microvascular angiopathy. Vascular: Atherosclerotic calcification of the carotid siphons and intradural vertebral arteries. No hyperdense vessel. Skull: Benign hyperostosis frontalis interna, similar to prior. No calvarial fracture or suspicious osseous lesion. No scalp swelling or hematoma. Sinuses/Orbits: Paranasal sinuses and mastoid air cells are predominantly clear. Orbital structures are unremarkable aside from prior lens extractions. Other: Mild TMJ arthrosis. Extensive debris in both external auditory canals. IMPRESSION: 1. No acute intracranial abnormality. 2. Stable remote right occipital lobe infarct. 3. Stable parenchymal volume loss and chronic microvascular angiopathy. 4. Debris in the external auditory canals. Correlate with visual inspection to exclude a cerumen impaction. Electronically Signed   By: Kreg ShropshirePrice  DeHay M.D.   On: 12/04/2019 16:45   DG Chest Port 1 View  Result Date: 12/04/2019 CLINICAL DATA:  Patient unresponsive. Insulin pump fell off 2 days ago. Glucose measured at 600. EXAM: PORTABLE CHEST 1  VIEW COMPARISON:  04/19/2017. FINDINGS: Cardiac silhouette normal in size. No mediastinal or hilar masses. No evidence of adenopathy. Small focus of opacity in the left mid lung is consistent with scarring. Lungs otherwise clear. No pleural effusion or pneumothorax. Skeletal structures are grossly intact. IMPRESSION: No acute cardiopulmonary disease. Electronically Signed   By: Amie Portland M.D.   On: 12/04/2019 13:24   [place] EKG SITE RITE  Result Date: 12/05/2019 If Site Rite image not attached, placement could not be confirmed due to current cardiac rhythm.    Microbiology: Recent Results (from the past 240 hour(s))    SARS CORONAVIRUS 2 (Sparkle Aube 6-24 HRS) Nasopharyngeal Nasopharyngeal Swab     Status: None   Collection Time: 12/04/19  5:43 PM   Specimen: Nasopharyngeal Swab  Result Value Ref Range Status   SARS Coronavirus 2 NEGATIVE NEGATIVE Final    Comment: (NOTE) SARS-CoV-2 target nucleic acids are NOT DETECTED. The SARS-CoV-2 RNA is generally detectable in upper and lower respiratory specimens during the acute phase of infection. Negative results do not preclude SARS-CoV-2 infection, do not rule out co-infections with other pathogens, and should not be used as the sole basis for treatment or other patient management decisions. Negative results must be combined with clinical observations, patient history, and epidemiological information. The expected result is Negative. Fact Sheet for Patients: HairSlick.no Fact Sheet for Healthcare Providers: quierodirigir.com This test is not yet approved or cleared by the [place] FDA and  has been authorized for detection and/or diagnosis of SARS-CoV-2 by FDA under an Emergency Use Authorization (EUA). This EUA will remain  in effect (meaning this test can be used) for the duration of the COVID-19 declaration under Section 56 4(b)(1) of the Act, 21 U.S.C. section 360bbb-3(b)(1), unless the authorization is terminated or revoked sooner. Performed at [facility], [address], [place], [place] [address]      Labs: Basic Metabolic Panel: Recent Labs  Lab 12/04/19 1849 12/04/19 1849 12/04/19 2156 12/04/19 2156 12/05/19 0241 12/05/19 0241 12/05/19 1553 12/06/19 0559  NA 143  --  143  --  144  --  138 137  K 4.5   < > 3.5   < > 3.9   < > 3.2* 3.6  CL 116*  --  118*  --  120*  --  116* 116*  CO2 10*  --  15*  --  16*  --  17* 17*  GLUCOSE 325*  --  195*  --  145*  --  277* 318*  BUN 54*  --  48*  --  43*  --  33* 24*  CREATININE 1.40*  --  1.07*  --  0.96  --  0.70 0.65  CALCIUM  8.8*  --  8.5*  --  8.4*  --  8.3* 8.2*  MG  --   --   --   --   --   --  2.0  --    < > = values in this interval not displayed.   Liver Function Tests: Recent Labs  Lab 12/04/19 1616 12/06/19 0559  AST 14* 16  ALT 17 15  ALKPHOS 141* 95  BILITOT 1.8* 0.8  PROT 8.1 5.8*  ALBUMIN 4.5 3.1*   No results for input(s): LIPASE, AMYLASE in the last 168 hours. Recent Labs  Lab 12/05/19 1553  AMMONIA 24   CBC: Recent Labs  Lab 12/04/19 1616  WBC 8.2  NEUTROABS 6.2  HGB 14.9  HCT 46.6*  MCV 81.0  PLT 201   Cardiac Enzymes: No results for input(s): CKTOTAL, CKMB, CKMBINDEX, TROPONINI in the last 168 hours. BNP: Invalid input(s): POCBNP CBG: Recent Labs  Lab 12/05/19 1055 12/05/19 1208 12/05/19 1744 12/05/19 2055 12/06/19 0747  GLUCAP 124* 129* 230* 209* 253*    Time coordinating discharge:  36 minutes  Signed:  Orson Eva, DO [facility] Pager: [phone] 12/06/2019, 11:31 AM

## 2019-12-06 NOTE — TOC Transition Note (Signed)
Transition of Care [facility]) - CM/SW Discharge Note   Patient Details  Name: [name] MRN: [id number] Date of Birth: 12/13/1950  Transition of Care [facility] CM/SW Contact:  Malcolm Metro, RN Phone Number: 12/06/2019, 11:24 AM   Clinical Narrative:   DC home today with referral for [facility] nursing and PT. Pt has used multiple HH providers in the past and has no preference of HH provider. Delila Spence rep, has accepted referral and will pull info from Epic.     Final next level of care: Home w Home Health Services Barriers to Discharge: No Barriers Identified   Patient Goals and CMS Choice Patient states their goals for this hospitalization and ongoing recovery are:: get better CMS Medicare.gov Compare Post Acute Care list provided to:: Patient Choice offered to / list presented to : Patient   Discharge Plan and Services    HH Arranged: RN, PT [facility] Agency: [facility] Date [facility] Agency Contacted: 12/06/19 Time HH Agency Contacted: [phone] Representative spoke with at [facility] Agency: Kandee Keen

## 2019-12-06 NOTE — ED Notes (Signed)
Pt's sig other demonstrated set up and start of pt's VGO. Verbalize understanding of discharge instructions, need for call to [facility] and will set up diabetes education meeting.

## 2020-11-24 ENCOUNTER — Other Ambulatory Visit: Payer: Self-pay

## 2020-11-24 ENCOUNTER — Encounter (HOSPITAL_COMMUNITY): Payer: Self-pay | Admitting: Emergency Medicine

## 2020-11-24 ENCOUNTER — Emergency Department (HOSPITAL_COMMUNITY): Payer: Medicare Other

## 2020-11-24 ENCOUNTER — Inpatient Hospital Stay (HOSPITAL_COMMUNITY)
Admission: EM | Admit: 2020-11-24 | Discharge: 2020-11-30 | DRG: 871 | Disposition: A | Discharge status: Home Health | Payer: Medicare Other | Attending: Internal Medicine | Admitting: Internal Medicine

## 2020-11-24 DIAGNOSIS — Z823 Family history of stroke: Secondary | ICD-10-CM | POA: Diagnosis not present

## 2020-11-24 DIAGNOSIS — J9601 Acute respiratory failure with hypoxia: Secondary | ICD-10-CM | POA: Diagnosis present

## 2020-11-24 DIAGNOSIS — Z87820 Personal history of traumatic brain injury: Secondary | ICD-10-CM

## 2020-11-24 DIAGNOSIS — N179 Acute kidney failure, unspecified: Secondary | ICD-10-CM | POA: Diagnosis not present

## 2020-11-24 DIAGNOSIS — J690 Pneumonitis due to inhalation of food and vomit: Secondary | ICD-10-CM | POA: Diagnosis present

## 2020-11-24 DIAGNOSIS — Z87891 Personal history of nicotine dependence: Secondary | ICD-10-CM

## 2020-11-24 DIAGNOSIS — E1011 Type 1 diabetes mellitus with ketoacidosis with coma: Secondary | ICD-10-CM | POA: Insufficient documentation

## 2020-11-24 DIAGNOSIS — F259 Schizoaffective disorder, unspecified: Secondary | ICD-10-CM | POA: Diagnosis present

## 2020-11-24 DIAGNOSIS — J189 Pneumonia, unspecified organism: Secondary | ICD-10-CM | POA: Diagnosis present

## 2020-11-24 DIAGNOSIS — R7989 Other specified abnormal findings of blood chemistry: Secondary | ICD-10-CM | POA: Diagnosis present

## 2020-11-24 DIAGNOSIS — Z79899 Other long term (current) drug therapy: Secondary | ICD-10-CM | POA: Diagnosis not present

## 2020-11-24 DIAGNOSIS — I471 Supraventricular tachycardia: Secondary | ICD-10-CM | POA: Diagnosis present

## 2020-11-24 DIAGNOSIS — Z794 Long term (current) use of insulin: Secondary | ICD-10-CM

## 2020-11-24 DIAGNOSIS — E876 Hypokalemia: Secondary | ICD-10-CM | POA: Diagnosis present

## 2020-11-24 DIAGNOSIS — Z9641 Presence of insulin pump (external) (internal): Secondary | ICD-10-CM | POA: Diagnosis present

## 2020-11-24 DIAGNOSIS — Z20822 Contact with and (suspected) exposure to covid-19: Secondary | ICD-10-CM | POA: Diagnosis present

## 2020-11-24 DIAGNOSIS — Z880 Allergy status to penicillin: Secondary | ICD-10-CM | POA: Diagnosis not present

## 2020-11-24 DIAGNOSIS — I5022 Chronic systolic (congestive) heart failure: Secondary | ICD-10-CM | POA: Diagnosis present

## 2020-11-24 DIAGNOSIS — E785 Hyperlipidemia, unspecified: Secondary | ICD-10-CM | POA: Diagnosis present

## 2020-11-24 DIAGNOSIS — R6521 Severe sepsis with septic shock: Secondary | ICD-10-CM | POA: Diagnosis present

## 2020-11-24 DIAGNOSIS — E1110 Type 2 diabetes mellitus with ketoacidosis without coma: Secondary | ICD-10-CM | POA: Insufficient documentation

## 2020-11-24 DIAGNOSIS — I110 Hypertensive heart disease with heart failure: Secondary | ICD-10-CM | POA: Diagnosis present

## 2020-11-24 DIAGNOSIS — I509 Heart failure, unspecified: Secondary | ICD-10-CM

## 2020-11-24 DIAGNOSIS — Z825 Family history of asthma and other chronic lower respiratory diseases: Secondary | ICD-10-CM | POA: Diagnosis not present

## 2020-11-24 DIAGNOSIS — G9341 Metabolic encephalopathy: Secondary | ICD-10-CM | POA: Diagnosis present

## 2020-11-24 DIAGNOSIS — A419 Sepsis, unspecified organism: Principal | ICD-10-CM | POA: Diagnosis present

## 2020-11-24 DIAGNOSIS — E860 Dehydration: Secondary | ICD-10-CM | POA: Diagnosis present

## 2020-11-24 DIAGNOSIS — F39 Unspecified mood [affective] disorder: Secondary | ICD-10-CM | POA: Diagnosis present

## 2020-11-24 DIAGNOSIS — I351 Nonrheumatic aortic (valve) insufficiency: Secondary | ICD-10-CM | POA: Diagnosis not present

## 2020-11-24 DIAGNOSIS — Z789 Other specified health status: Secondary | ICD-10-CM

## 2020-11-24 DIAGNOSIS — F209 Schizophrenia, unspecified: Secondary | ICD-10-CM | POA: Clinically undetermined

## 2020-11-24 DIAGNOSIS — E875 Hyperkalemia: Secondary | ICD-10-CM | POA: Diagnosis present

## 2020-11-24 DIAGNOSIS — Z8249 Family history of ischemic heart disease and other diseases of the circulatory system: Secondary | ICD-10-CM | POA: Diagnosis not present

## 2020-11-24 DIAGNOSIS — Z833 Family history of diabetes mellitus: Secondary | ICD-10-CM

## 2020-11-24 DIAGNOSIS — D751 Secondary polycythemia: Secondary | ICD-10-CM | POA: Diagnosis present

## 2020-11-24 DIAGNOSIS — E1111 Type 2 diabetes mellitus with ketoacidosis with coma: Secondary | ICD-10-CM | POA: Diagnosis not present

## 2020-11-24 DIAGNOSIS — E861 Hypovolemia: Secondary | ICD-10-CM | POA: Diagnosis present

## 2020-11-24 DIAGNOSIS — Z8674 Personal history of sudden cardiac arrest: Secondary | ICD-10-CM

## 2020-11-24 DIAGNOSIS — I5021 Acute systolic (congestive) heart failure: Secondary | ICD-10-CM | POA: Diagnosis not present

## 2020-11-24 DIAGNOSIS — Z86718 Personal history of other venous thrombosis and embolism: Secondary | ICD-10-CM

## 2020-11-24 DIAGNOSIS — Z452 Encounter for adjustment and management of vascular access device: Secondary | ICD-10-CM

## 2020-11-24 LAB — BLOOD GAS, VENOUS
Acid-base deficit: 8.3 mmol/L — ABNORMAL HIGH (ref 0.0–2.0)
Acid-base deficit: 25.6 mmol/L — ABNORMAL HIGH (ref 0.0–2.0)
Acid-base deficit: 14.1 mmol/L — ABNORMAL HIGH (ref 0.0–2.0)
Bicarbonate: 17.1 mmol/L — ABNORMAL LOW (ref 20.0–28.0)
Bicarbonate: 6.2 mmol/L — ABNORMAL LOW (ref 20.0–28.0)
Bicarbonate: 14 mmol/L — ABNORMAL LOW (ref 20.0–28.0)
Drawn by: 5863
FIO2: 21
FIO2: 28
FIO2: 21
FIO2: 21
O2 Saturation: 51.7 %
O2 Saturation: 92.5 %
O2 Saturation: 83.6 %
O2 Saturation: 83.8 %
Patient temperature: 37
Patient temperature: 37
Patient temperature: 36.9
Patient temperature: 35.7
pCO2, Ven: 34.1 mmHg — ABNORMAL LOW (ref 44.0–60.0)
pCO2, Ven: 20.5 mmHg — ABNORMAL LOW (ref 44.0–60.0)
pCO2, Ven: 21.1 mmHg — ABNORMAL LOW (ref 44.0–60.0)
pCO2, Ven: 21.3 mmHg — ABNORMAL LOW (ref 44.0–60.0)
pH, Ven: 7.312 (ref 7.250–7.430)
pH, Ven: <6.8 — CL (ref 7.250–7.430)
pH, Ven: 6.928 — CL (ref 7.250–7.430)
pH, Ven: 7.327 (ref 7.250–7.430)
pO2, Ven: <31 mmHg — CL (ref 32.0–45.0)
pO2, Ven: 93.4 mmHg — ABNORMAL HIGH (ref 32.0–45.0)
pO2, Ven: 57.2 mmHg — ABNORMAL HIGH (ref 32.0–45.0)
pO2, Ven: 40.5 mmHg (ref 32.0–45.0)

## 2020-11-24 LAB — BASIC METABOLIC PANEL
Anion gap: 20 — ABNORMAL HIGH (ref 5–15)
BUN: 40 mg/dL — ABNORMAL HIGH (ref 8–23)
BUN: 40 mg/dL — ABNORMAL HIGH (ref 8–23)
BUN: 40 mg/dL — ABNORMAL HIGH (ref 8–23)
CO2: <7 mmol/L — ABNORMAL LOW (ref 22–32)
CO2: <7 mmol/L — ABNORMAL LOW (ref 22–32)
CO2: 11 mmol/L — ABNORMAL LOW (ref 22–32)
Calcium: 8.4 mg/dL — ABNORMAL LOW (ref 8.9–10.3)
Calcium: 8.5 mg/dL — ABNORMAL LOW (ref 8.9–10.3)
Calcium: 8.2 mg/dL — ABNORMAL LOW (ref 8.9–10.3)
Chloride: 106 mmol/L (ref 98–111)
Chloride: 107 mmol/L (ref 98–111)
Chloride: 111 mmol/L (ref 98–111)
Creatinine, Ser: 2.01 mg/dL — ABNORMAL HIGH (ref 0.44–1.00)
Creatinine, Ser: 1.95 mg/dL — ABNORMAL HIGH (ref 0.44–1.00)
Creatinine, Ser: 1.76 mg/dL — ABNORMAL HIGH (ref 0.44–1.00)
GFR, Estimated: 27 mL/min — ABNORMAL LOW (ref >60)
GFR, Estimated: 28 mL/min — ABNORMAL LOW (ref >60)
GFR, Estimated: 31 mL/min — ABNORMAL LOW (ref >60)
Glucose, Bld: 653 mg/dL (ref 70–99)
Glucose, Bld: 615 mg/dL (ref 70–99)
Glucose, Bld: 382 mg/dL — ABNORMAL HIGH (ref 70–99)
Potassium: 3.9 mmol/L (ref 3.5–5.1)
Potassium: 4.5 mmol/L (ref 3.5–5.1)
Potassium: 3.7 mmol/L (ref 3.5–5.1)
Sodium: 139 mmol/L (ref 135–145)
Sodium: 141 mmol/L (ref 135–145)
Sodium: 142 mmol/L (ref 135–145)

## 2020-11-24 LAB — CBC WITH DIFFERENTIAL/PLATELET
Abs Immature Granulocytes: 0.22 10*3/uL — ABNORMAL HIGH (ref 0.00–0.07)
Basophils Absolute: 0.2 10*3/uL — ABNORMAL HIGH (ref 0.0–0.1)
Basophils Relative: 2 %
Eosinophils Absolute: 0 10*3/uL (ref 0.0–0.5)
Eosinophils Relative: 0 %
HCT: 54.9 % — ABNORMAL HIGH (ref 36.0–46.0)
Hemoglobin: 16.4 g/dL — ABNORMAL HIGH (ref 12.0–15.0)
Immature Granulocytes: 2 %
Lymphocytes Relative: 12 %
Lymphs Abs: 1.5 10*3/uL (ref 0.7–4.0)
MCH: 27.7 pg (ref 26.0–34.0)
MCHC: 29.9 g/dL — ABNORMAL LOW (ref 30.0–36.0)
MCV: 92.7 fL (ref 80.0–100.0)
Monocytes Absolute: 1.1 10*3/uL — ABNORMAL HIGH (ref 0.1–1.0)
Monocytes Relative: 9 %
Neutro Abs: 9.4 10*3/uL — ABNORMAL HIGH (ref 1.7–7.7)
Neutrophils Relative %: 75 %
Platelets: 299 10*3/uL (ref 150–400)
RBC: 5.92 MIL/uL — ABNORMAL HIGH (ref 3.87–5.11)
RDW: 14.6 % (ref 11.5–15.5)
WBC: 12.4 10*3/uL — ABNORMAL HIGH (ref 4.0–10.5)
nRBC: 0 % (ref 0.0–0.2)

## 2020-11-24 LAB — URINALYSIS, ROUTINE W REFLEX MICROSCOPIC
Bacteria, UA: NONE SEEN
Bilirubin Urine: NEGATIVE
Glucose, UA: >=500 mg/dL — AB
Ketones, ur: 80 mg/dL — AB
Leukocytes,Ua: NEGATIVE
Nitrite: NEGATIVE
Protein, ur: 100 mg/dL — AB
Specific Gravity, Urine: 1.02 (ref 1.005–1.030)
pH: 5 (ref 5.0–8.0)

## 2020-11-24 LAB — GLUCOSE, CAPILLARY
Glucose-Capillary: 229 mg/dL — ABNORMAL HIGH (ref 70–99)
Glucose-Capillary: 289 mg/dL — ABNORMAL HIGH (ref 70–99)

## 2020-11-24 LAB — I-STAT CHEM 8, ED
BUN: 41 mg/dL — ABNORMAL HIGH (ref 8–23)
Calcium, Ion: 1.19 mmol/L (ref 1.15–1.40)
Chloride: 109 mmol/L (ref 98–111)
Creatinine, Ser: 1.6 mg/dL — ABNORMAL HIGH (ref 0.44–1.00)
Glucose, Bld: >700 mg/dL (ref 70–99)
HCT: 55 % — ABNORMAL HIGH (ref 36.0–46.0)
Hemoglobin: 18.7 g/dL — ABNORMAL HIGH (ref 12.0–15.0)
Potassium: 6.4 mmol/L (ref 3.5–5.1)
Sodium: 131 mmol/L — ABNORMAL LOW (ref 135–145)
TCO2: 5 mmol/L — ABNORMAL LOW (ref 22–32)

## 2020-11-24 LAB — PHOSPHORUS: Phosphorus: 5.2 mg/dL — ABNORMAL HIGH (ref 2.5–4.6)

## 2020-11-24 LAB — RESP PANEL BY RT-PCR (FLU A&B, COVID) ARPGX2
Influenza A by PCR: NEGATIVE
Influenza B by PCR: NEGATIVE
SARS Coronavirus 2 by RT PCR: NEGATIVE

## 2020-11-24 LAB — POC SARS CORONAVIRUS 2 AG -  ED
SARS Coronavirus 2 Ag: NEGATIVE
SARS Coronavirus 2 Ag: NEGATIVE

## 2020-11-24 LAB — CBG MONITORING, ED
Glucose-Capillary: >600 mg/dL (ref 70–99)
Glucose-Capillary: 558 mg/dL (ref 70–99)
Glucose-Capillary: 499 mg/dL — ABNORMAL HIGH (ref 70–99)
Glucose-Capillary: 371 mg/dL — ABNORMAL HIGH (ref 70–99)
Glucose-Capillary: 428 mg/dL — ABNORMAL HIGH (ref 70–99)
Glucose-Capillary: 251 mg/dL — ABNORMAL HIGH (ref 70–99)

## 2020-11-24 LAB — MAGNESIUM: Magnesium: 2.4 mg/dL (ref 1.7–2.4)

## 2020-11-24 LAB — BETA-HYDROXYBUTYRIC ACID
Beta-Hydroxybutyric Acid: >8 mmol/L — ABNORMAL HIGH (ref 0.05–0.27)
Beta-Hydroxybutyric Acid: >8 mmol/L — ABNORMAL HIGH (ref 0.05–0.27)

## 2020-11-24 LAB — OSMOLALITY: Osmolality: 364 mosm/kg (ref 275–295)

## 2020-11-24 LAB — HEMOGLOBIN A1C
Hgb A1c MFr Bld: 13.3 % — ABNORMAL HIGH (ref 4.8–5.6)
Mean Plasma Glucose: 335.01 mg/dL

## 2020-11-24 LAB — TROPONIN I (HIGH SENSITIVITY): Troponin I (High Sensitivity): 8 ng/L

## 2020-11-24 MED ORDER — SODIUM BICARBONATE 8.4 % IV SOLN
100.0000 meq | Freq: Once | INTRAVENOUS | Status: AC
  Administered 2020-11-24: 100 meq via INTRAVENOUS

## 2020-11-24 MED ORDER — POTASSIUM CHLORIDE 10 MEQ/100ML IV SOLN
10.0000 meq | INTRAVENOUS | Status: AC
  Administered 2020-11-24 (×3): 10 meq via INTRAVENOUS
  Filled 2020-11-24 (×3): qty 100

## 2020-11-24 MED ORDER — ONDANSETRON HCL 4 MG/2ML IJ SOLN
INTRAMUSCULAR | Status: AC
  Filled 2020-11-24: qty 2

## 2020-11-24 MED ORDER — NOREPINEPHRINE 4 MG/250ML-% IV SOLN: 8.0000 ug/min | INTRAVENOUS | Status: DC

## 2020-11-24 MED ORDER — SODIUM BICARBONATE 8.4 % IV SOLN
INTRAVENOUS | Status: AC
  Filled 2020-11-24: qty 100

## 2020-11-24 MED ORDER — LACTATED RINGERS IV BOLUS
20.0000 mL/kg | Freq: Once | INTRAVENOUS | Status: AC
  Administered 2020-11-24: 1000 mL via INTRAVENOUS

## 2020-11-24 MED ORDER — LACTATED RINGERS IV SOLN: INTRAVENOUS | Status: DC

## 2020-11-24 MED ORDER — INSULIN REGULAR(HUMAN) IN NACL 100-0.9 UT/100ML-% IV SOLN: INTRAVENOUS | Status: DC

## 2020-11-24 MED ORDER — LACTATED RINGERS IV BOLUS
1000.0000 mL | Freq: Once | INTRAVENOUS | Status: AC
  Administered 2020-11-25: 1000 mL via INTRAVENOUS

## 2020-11-24 MED ORDER — HYDROCORTISONE NA SUCCINATE PF 100 MG IJ SOLR
100.0000 mg | Freq: Once | INTRAMUSCULAR | Status: AC
  Administered 2020-11-24: 100 mg via INTRAVENOUS
  Filled 2020-11-24: qty 2

## 2020-11-24 MED ORDER — DEXTROSE IN LACTATED RINGERS 5 % IV SOLN: INTRAVENOUS | Status: DC

## 2020-11-24 MED ORDER — LACTATED RINGERS IV BOLUS
1000.0000 mL | Freq: Once | INTRAVENOUS | Status: AC
  Administered 2020-11-24: 1000 mL via INTRAVENOUS

## 2020-11-24 MED ORDER — HEPARIN SODIUM (PORCINE) 5000 UNIT/ML IJ SOLN
5000.0000 [IU] | Freq: Three times a day (TID) | INTRAMUSCULAR | Status: DC
  Administered 2020-11-24 – 2020-11-26 (×6): 5000 [IU] via SUBCUTANEOUS
  Filled 2020-11-24 (×6): qty 1

## 2020-11-24 MED ORDER — LACTATED RINGERS IV BOLUS: 1000.0000 mL | Freq: Once | INTRAVENOUS | Status: DC

## 2020-11-24 MED ORDER — ADENOSINE 6 MG/2ML IV SOLN
INTRAVENOUS | Status: AC
  Administered 2020-11-24: 6 mg
  Filled 2020-11-24: qty 2

## 2020-11-24 MED ORDER — POTASSIUM CHLORIDE 10 MEQ/100ML IV SOLN
10.0000 meq | INTRAVENOUS | Status: AC
Start: 2020-11-24 — End: 2020-11-25
  Administered 2020-11-25 (×3): 10 meq via INTRAVENOUS
  Filled 2020-11-24: qty 100

## 2020-11-24 MED ORDER — POTASSIUM CHLORIDE 10 MEQ/100ML IV SOLN: 10.0000 meq | INTRAVENOUS | Status: DC

## 2020-11-24 MED ORDER — DEXTROSE 50 % IV SOLN: 0.0000 mL | INTRAVENOUS | Status: DC | PRN

## 2020-11-24 MED ORDER — LEVOFLOXACIN IN D5W 750 MG/150ML IV SOLN
750.0000 mg | Freq: Once | INTRAVENOUS | Status: AC
  Administered 2020-11-24: 750 mg via INTRAVENOUS
  Filled 2020-11-24: qty 150

## 2020-11-24 MED ORDER — SODIUM CHLORIDE 0.9 % IV BOLUS
2000.0000 mL | Freq: Once | INTRAVENOUS | Status: AC
  Administered 2020-11-24: 2000 mL via INTRAVENOUS

## 2020-11-24 MED ORDER — NOREPINEPHRINE 4 MG/250ML-% IV SOLN
INTRAVENOUS | Status: AC
  Administered 2020-11-24: 4 mg via INTRAVENOUS
  Filled 2020-11-24: qty 250

## 2020-11-24 MED ORDER — STERILE WATER FOR INJECTION IV SOLN
INTRAVENOUS | Status: DC
  Filled 2020-11-24 (×3): qty 850

## 2020-11-24 MED ORDER — ONDANSETRON HCL 4 MG/2ML IJ SOLN
4.0000 mg | Freq: Four times a day (QID) | INTRAMUSCULAR | Status: DC | PRN
  Administered 2020-11-24: 4 mg via INTRAVENOUS

## 2020-11-24 MED ORDER — INSULIN REGULAR(HUMAN) IN NACL 100-0.9 UT/100ML-% IV SOLN
INTRAVENOUS | Status: DC
  Administered 2020-11-24: 8 [IU]/h via INTRAVENOUS
  Administered 2020-11-24: 9.5 [IU]/h via INTRAVENOUS
  Filled 2020-11-24 (×3): qty 100

## 2020-11-24 NOTE — ED Notes (Signed)
Warming device (bear hugger) applied

## 2020-11-24 NOTE — Progress Notes (Signed)
Pt went into SVT rate of 202 with BP 85/45.  Pt given 6 of adenosine with successful conversion back to S.Tach at ~120 with BP back to 110s systolic (both of which is where she had been prior to the SVT).

## 2020-11-24 NOTE — ED Notes (Signed)
Dr Ronny Flurry made aware of low blood pressure

## 2020-11-24 NOTE — H&P (Signed)
TRH H&P   Patient Demographics:    [name], is a 70 y.o. female  MRN: [id number]   DOB - [birth year]  Admit Date - 11/24/2020  Outpatient Primary MD for the patient is Lindley Magnus, PA-C  Referring MD/NP/PA: Dr Renaye Rakers  Outpatient Specialists : Endocrinology Dr. Shawnee Knapp in [place]  Patient coming from: home  Chief Complaint  Patient presents with  . Respiratory Distress      HPI:    [name]  is a 70 y.o. 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 Jolyne Loa in [place], patient presents to ED secondary to altered mental status, lives with significant other for last 20 years, who was available at bedside and provided history, report patient with known history of diabetes, she is using insulin pump, report he went to fill it today, and found it empty, but report there was some insulin in yesterday, report overall patient blood glucose usually elevated in the 300s, reports she has been more drowsy, lethargic and less responsive this morning, he does report she had a fall yesterday evening as well, she was unresponsive on the floor, somnolent not verbalized to them, EMS and her hypopneic as well, with respiratory rate around 10, they provide some supplemental bagging oxygen and route to the hospital, glucose was high.  The read, no additional IV medications were given, patient is obtunded and unable to provide any history currently. - in ED via DKA, with pH<6.8, potassium 4, peaked T waves on EKG, glucose> 700, bicarb is pending, negative Covid 19 test, beta hydroxybutyric acid of>8, she was started on IV fluids, insulin drip, seen by PCCM, and [facility] hospitalist consulted to admit.    Review  of systems:    Patient unable to provide any review of system secondary to altered mental status   With Past History of the following :    Past Medical History:  Diagnosis Date  . Allergy   . Diabetes mellitus without complication (HCC)   . Mixed stress and urge urinary incontinence   . Pneumonia   . Schizophrenia (HCC)       No past surgical history on file.    Social History:     Social History   Tobacco Use  . Smoking status: Former Smoker    Packs/day: 3.00    Types: Cigarettes  . Smokeless tobacco: Never Used  Substance Use Topics  . Alcohol use: Yes    Alcohol/week: 6.0 standard drinks    Types: 6 Cans of beer per week       Family History :     Family History  Problem Relation Age of Onset  . Heart disease Mother   . Hypertension Father   . Stroke Father   . Heart disease Father   .  Diabetes Father   . Emphysema Paternal Grandfather       Home Medications:   Prior to Admission medications   Medication Sig Start Date End Date Taking? Authorizing Provider  ARIPiprazole (ABILIFY) 20 MG tablet Take 20 mg by mouth daily.     [provider]  atorvastatin (LIPITOR) 20 MG tablet Take 20 mg by mouth daily. 11/09/19   [provider]  benztropine (COGENTIN) 1 MG tablet Take 1 mg by mouth at bedtime.     [provider]  buPROPion (WELLBUTRIN) 75 MG tablet Take 75 mg by mouth 2 (two) times daily.    [provider]  hydrOXYzine (ATARAX/VISTARIL) 25 MG tablet Take 25-50 mg by mouth at bedtime as needed for anxiety.  11/07/19   [provider]  insulin aspart (NOVOLOG) 100 UNIT/ML injection Inject three times daily with meals.  Correction coverage: Moderate (average weight, post-op) CBG < 70: implement hypoglycemia protocol CBG 70 - 120: 0 units CBG 121 - 150: 2 units CBG 151 - 200: 3 units CBG 201 - 250: 5 units CBG 251 - 300: 8 units CBG 301 - 350: 11 units CBG 351 - 400: 15 units CBG > 400: call MD and  obtain STAT lab verification Patient taking differently: Inject into the skin continuous. VIa insulin pum   p Inject three times daily with meals.  Correction coverage: Moderate (average weight, post-op) CBG < 70: implement hypoglycemia protocol CBG 70 - 120: 0 units CBG 121 - 150: 2 units CBG 151 - 200: 3 units CBG 201 - 250: 5 units CBG 251 - 300: 8 units CBG 301 - 350: 11 units CBG 351 - 400: 15 units CBG > 400: call MD 04/25/17   Patrecia Pour, MD  metoprolol succinate (TOPROL-XL) 25 MG 24 hr tablet Take 12.5 mg by mouth daily.  11/30/19   [provider]  mirtazapine (REMERON) 30 MG tablet Take 30 mg by mouth at bedtime.     [provider]  OYSCO 500 500 MG TABS Take 1 tablet by mouth daily.  09/23/19   [provider]     Allergies:     Allergies  Allergen Reactions  . Penicillins Rash    Did it involve swelling of the face/tongue/throat, SOB, or low BP? Unknown Did it involve sudden or severe rash/hives, skin peeling, or any reaction on the inside of your mouth or nose? Unknown Did you need to seek medical attention at a hospital or doctor's office? Unknown When did it last happen? If all above answers are "NO", may proceed with cephalosporin use.      Physical Exam:   Vitals  Blood pressure (!) 93/48, pulse 81, temperature (!) 93.4 F (34.1 C), temperature source Rectal, resp. rate 17, SpO2 100 %.   1. General appearing, female, appears older than stated age, laying in bed, in mild discomfort  2.  Is lethargic, opens eyes to verbal stimuli, confused, follows simple commands .  3.  Deficits could be appreciated, she is withdrawing to pain in 4 extremities .  4. Ears and Eyes appear Normal, Conjunctivae clear, PERRLA.  Dry oral Mucosa.  5. Supple Neck, No JVD, No cervical lymphadenopathy appriciated, No Carotid Bruits.  6. Symmetrical Chest wall movement, Good air movement bilaterally, CTAB.  7. RRR, No Gallops, Rubs or  Murmurs, No Parasternal Heave.  8. Positive Bowel Sounds, Abdomen Soft, No tenderness, No organomegaly appriciated,No rebound -guarding or rigidity.  9.  No Cyanosis, Normal Skin Turgor, No  Skin Rash or Bruise.  10.  Late skin turgor, mottling in lower extremities, mild laceration in the right knee area, please review pictures below .  11. No Palpable Lymph Nodes in Neck or Axillae        Data Review:    CBC Recent Labs  Lab 11/24/20 1236 11/24/20 1242  WBC 12.4*  --   HGB 16.4* 18.7*  HCT 54.9* 55.0*  PLT 299  --   MCV 92.7  --   MCH 27.7  --   MCHC 29.9*  --   RDW 14.6  --   LYMPHSABS 1.5  --   MONOABS 1.1*  --   EOSABS 0.0  --   BASOSABS 0.2*  --    ------------------------------------------------------------------------------------------------------------------  Chemistries  Recent Labs  Lab 11/24/20 1242  NA 131*  K 6.4*  CL 109  GLUCOSE >700*  BUN 41*  CREATININE 1.60*   ------------------------------------------------------------------------------------------------------------------ CrCl cannot be calculated (Unknown ideal weight.). ------------------------------------------------------------------------------------------------------------------ No results for input(s): TSH, T4TOTAL, T3FREE, THYROIDAB in the last 72 hours.  Invalid input(s): FREET3  Coagulation profile No results for input(s): INR, PROTIME in the last 168 hours. ------------------------------------------------------------------------------------------------------------------- No results for input(s): DDIMER in the last 72 hours. -------------------------------------------------------------------------------------------------------------------  Cardiac Enzymes No results for input(s): CKMB, TROPONINI, MYOGLOBIN in the last 168 hours.  Invalid input(s): CK ------------------------------------------------------------------------------------------------------------------ No results  found for: BNP   ---------------------------------------------------------------------------------------------------------------  Urinalysis    Component Value Date/Time   COLORURINE YELLOW 11/24/2020 1236   APPEARANCEUR CLEAR 11/24/2020 1236   LABSPEC 1.020 11/24/2020 1236   PHURINE 5.0 11/24/2020 1236   GLUCOSEU >=500 (A) 11/24/2020 1236   HGBUR SMALL (A) 11/24/2020 1236   BILIRUBINUR NEGATIVE 11/24/2020 1236   BILIRUBINUR neg 05/22/2014 0935   KETONESUR 80 (A) 11/24/2020 1236   PROTEINUR 100 (A) 11/24/2020 1236   UROBILINOGEN 0.2 10/14/2014 0545   NITRITE NEGATIVE 11/24/2020 1236   LEUKOCYTESUR NEGATIVE 11/24/2020 1236    ----------------------------------------------------------------------------------------------------------------   Imaging Results:    CT Head Wo Contrast  Result Date: 11/24/2020 CLINICAL DATA:  Altered mental status. EXAM: CT HEAD WITHOUT CONTRAST TECHNIQUE: Contiguous axial images were obtained from the base of the skull through the vertex without intravenous contrast. COMPARISON:  December 04, 2019. 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 No mass effect or midline shift is noted. Ventricular size is within normal limits. There is no evidence of mass lesion, hemorrhage or acute infarction. Vascular: No hyperdense vessel or unexpected calcification. Skull: Normal. Negative for fracture or focal lesion. Sinuses/Orbits: No acute finding. Other: None. 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 No acute intracranial abnormality seen. Electronically Signed   By: Lupita Raider M.D.   On: 11/24/2020 14:04   DG Chest Portable 1 View  Result Date: 11/24/2020 CLINICAL DATA:  Hypoxia and altered mental status. EXAM: PORTABLE CHEST 1 VIEW COMPARISON:  12/04/2019 and prior radiographs FINDINGS: The cardiomediastinal silhouette is unremarkable. Equivocal airspace opacity overlying the mid RIGHT lung noted. Mild chronic interstitial  opacities are again identified. There is no evidence of pleural effusion or pneumothorax IMPRESSION: Equivocal airspace opacity overlying the mid RIGHT lung, which may represent infection. Electronically Signed   By: Harmon Pier M.D.   On: 11/24/2020 13:34    My personal review of EKG: Rhythm NSR, Rate  81/min, QTc 497, with peaked T wave   Assessment & Plan:    Active Problems:   Diabetic ketoacidosis associated with type 2 diabetes mellitus (HCC)   Mood disorder (HCC)   AKI (acute  kidney injury) (HCC)   Dehydration   Schizophrenia (HCC)   Acute metabolic encephalopathy   Chronic systolic CHF (congestive heart failure) (HCC)   DKA (diabetic ketoacidosis) (HCC)   Severe diabetic ketoacidosis with coma -She is with severe DKA, pH <6.8, glucose >700, bicarb is pending. -Continue with aggressive IV fluid resuscitation, so far she received 2 L of LR bolus, will give her another 2, given pH less than 7, will add on bicarb drip as well, will keep on LR 150 cc/h at maintenance. -She will be admitted to ICU. -We will check hemoglobin A1c. -Tinea to monitor BMP closely, will check magnesium and phosphorus level as well, will replete as needed, continue with insulin drip till anion gap closed, continue to keep n.p.o. until anion gap closes as well and mentation improves. -She is on insulin pump, no clear etiology, boyfriend reports blood pressure usually high in the 300s, but reports overall she has been compliant, report this morning her insulin pump was empty, but she had insulin in the pump yesterday, will consult coordinator to assess for insulin pump.  Hyperkalemia -Potassium of 6.4, but this is on i-STAT, BMP is pending, she had some peaked T waves, she received bicarb, I will start on bicarb drip, I do anticipate potassium will significantly trend down with insulin drip and IV hydration, will monitor closely, monitoring telemetry.  Chronic systolic CHF-  -He is certainly hypovolemic,  continue with IV fluids, resume metoprolol when more awake and alert. -04/16/2017 echo EF 35-40%,G1DD,diffuse HK  Acute kidney injury -Baseline creatinine 0.4-0.8, it is 1.6 on admission, continue with IV fluids.  Acute metabolic encephalopathy -Due to severe DKA, CT head with no acute findings.  Schizophrenia - home occasions include Abilify, Cogentin, Wellbutrin, Remeron, resume when she is more awake and stable  Hyperlipidemia -Resume statin when she is awake and stable  [place] polycythemia -Patient had volume depletion, hemoglobin 16.4, should improve with IV fluids     DVT Prophylaxis Heparin   AM Labs Ordered, also please review Full Orders  Family Communication: Admission, patients condition and plan of care including tests being ordered have been discussed with significant other Richard at bedside (been living together for last 20 years, he provided his phone #[phone] )who indicate understanding and agree with the plan and Code Status.  Code Status Full  Likely DC to  home  Condition GUARDED    Consults called: PCCM    Admission status: inpatient    Time spent in minutes : 65 minutes   Huey Bienenstock M.D on 11/24/2020 at 3:29 PM   [facility] - Office  [phone]

## 2020-11-24 NOTE — ED Triage Notes (Signed)
Pt found at home by husband to be unresponsive, 80's on room air. EMS started to bag pts. Pt still unresponsive

## 2020-11-24 NOTE — ED Provider Notes (Signed)
[facility] EMERGENCY DEPARTMENT Provider Note   CSN: [id number] Arrival date & time: 11/24/20  1225     History Chief Complaint  Patient presents with  . Respiratory Distress    [name] is a 70 y.o. female with a history of type 1 diabetes, recurrent hospitalizations for DKA, schizophrenia, presenting from home after being found unresponsive.  The patient's husband or her domestic partner called EMS this morning when he found the patient unresponsive on the kitchen floor.  EMS reports patient was initially hypoxic on their arrival with a sat in the 80s.  They noted she was somnolent would not verbalize for them.  They noted she was hypopneic with a respiratory rate around 10, provided supplemental bagging oxygen in route to the hospital.  Glucose was high per their read.  No additional IV medications were given.  On arrival the patient is obtunded and unable to provide further information.  No immediate family answers after call to emergency contacts.  Per medical record review, the patient was hospitalized approxi-1 year ago for diabetic ketoacidosis.  She was treated and stabilized medically with IV fluids and potassium prior to discharge.  HPI     Past Medical History:  Diagnosis Date  . Allergy   . Diabetes mellitus without complication ([place])   . Mixed stress and urge urinary incontinence   . Pneumonia   . Schizophrenia [facility])     Patient Active Problem List   Diagnosis Date Noted  . Diabetic ketoacidosis with coma associated with type 1 diabetes mellitus ([place])   . Acute metabolic encephalopathy 27/04/2375  . Chronic systolic CHF (congestive heart failure) ([place]) 12/05/2019  . Acute on chronic systolic CHF (congestive heart failure) ([place]) 04/19/2017  . Arterial hypotension   . DKA (diabetic ketoacidoses) 04/15/2017  . Thrombocytopenia ([place]) 09/26/2016  . Pressure injury of skin 09/25/2016  . Metabolic acidosis 28/31/5176  . DKA, type 2 ([place]) 09/24/2016  .  Pneumonia due to Staphylococcus ([place]) 11/08/2014  . 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 History of stroke 11/08/2014  . Chronic systolic congestive heart failure ([place]) 11/08/2014  . Anemia of chronic disease 11/08/2014  . Dysphagia 11/08/2014  . Schizophrenia ([place]) 11/08/2014  . Bacteremia due to Staphylococcus aureus   . Dehydration   . Anoxic brain injury ([place])   . AKI (acute kidney injury) ([place])   . Encephalopathy acute 10/02/2014  . Altered mental status 10/01/2014  . Foreign body alimentary tract 10/01/2014  . Cardiac arrest ([place])   . Foreign body ingestion   . Diabetes ([place]) 05/22/2014  . Diabetic ketoacidosis associated with type 2 diabetes mellitus ([place]) 05/22/2014  . Mood disorder ([place]) 05/22/2014    No past surgical history on file.   OB History    Gravida  0   Para      Term      Preterm      AB      Living        SAB      IAB      Ectopic      Multiple      Live Births              Family History  Problem Relation Age of Onset  . Heart disease Mother   . Hypertension Father   . Stroke Father   . Heart disease Father   . Diabetes Father   . Emphysema Paternal Grandfather     Social History   Tobacco Use  .  Smoking status: Former Smoker    Packs/day: 3.00    Types: Cigarettes  . Smokeless tobacco: Never Used  Substance Use Topics  . Alcohol use: Yes    Alcohol/week: 6.0 standard drinks    Types: 6 Cans of beer per week  . Drug use: No    Home Medications Prior to Admission medications   Medication Sig Start Date End Date Taking? Authorizing Provider  ARIPiprazole (ABILIFY) 20 MG tablet Take 20 mg by mouth daily.     [provider]  atorvastatin (LIPITOR) 20 MG tablet Take 20 mg by mouth daily. 11/09/19   [provider]  benztropine (COGENTIN) 1 MG tablet Take 1 mg by mouth at bedtime.     [provider]  buPROPion (WELLBUTRIN) 75 MG tablet Take 75 mg by mouth 2 (two) times daily.     [provider]  hydrOXYzine (ATARAX/VISTARIL) 25 MG tablet Take 25-50 mg by mouth at bedtime as needed for anxiety.  11/07/19   [provider]  insulin aspart (NOVOLOG) 100 UNIT/ML injection Inject three times daily with meals.  Correction coverage: Moderate (average weight, post-op) CBG < 70: implement hypoglycemia protocol CBG 70 - 120: 0 units CBG 121 - 150: 2 units CBG 151 - 200: 3 units CBG 201 - 250: 5 units CBG 251 - 300: 8 units CBG 301 - 350: 11 units CBG 351 - 400: 15 units CBG > 400: call MD and obtain STAT lab verification Patient taking differently: Inject into the skin continuous. VIa insulin pum   p Inject three times daily with meals.  Correction coverage: Moderate (average weight, post-op) CBG < 70: implement hypoglycemia protocol CBG 70 - 120: 0 units CBG 121 - 150: 2 units CBG 151 - 200: 3 units CBG 201 - 250: 5 units CBG 251 - 300: 8 units CBG 301 - 350: 11 units CBG 351 - 400: 15 units CBG > 400: call MD 04/25/17   Tyrone Nine, MD  metoprolol succinate (TOPROL-XL) 25 MG 24 hr tablet Take 12.5 mg by mouth daily.  11/30/19   [provider]  mirtazapine (REMERON) 30 MG tablet Take 30 mg by mouth at bedtime.     [provider]  OYSCO 500 500 MG TABS Take 1 tablet by mouth daily.  09/23/19   [provider]    Allergies    Penicillins  Review of Systems   Review of Systems  Unable to perform ROS: Mental status change (level 5 caveat)    Physical Exam Updated Vital Signs BP (!) 93/48   Pulse 81   Temp (!) 93.4 F (34.1 C) (Rectal) Comment: MD aware  Resp 17   SpO2 100%   Physical Exam Vitals and nursing note reviewed.  Constitutional:      Appearance: She is well-developed and well-nourished.     Comments: Thin, chronically ill appearing, obtunded  HENT:     Head: Normocephalic and atraumatic.  Eyes:     Conjunctiva/sclera: Conjunctivae normal.     Pupils: Pupils are equal, round, and reactive to  light.  Cardiovascular:     Rate and Rhythm: Normal rate.     Pulses: Normal pulses.  Pulmonary:     Effort: Pulmonary effort is normal. No respiratory distress.     Comments: 100% on NRB RR 12-14 bpm Abdominal:     Palpations: Abdomen is soft.     Tenderness: There is no abdominal tenderness.  Musculoskeletal:        General: No  edema.     Cervical back: Neck supple.  Skin:    General: Skin is warm and dry.  Neurological:     GCS: GCS eye subscore is 2. GCS verbal subscore is 1. GCS motor subscore is 4.  Psychiatric:        Mood and Affect: Mood and affect normal.     Comments: * Entered in error - unable to [facility] - psych evaluation could not be performed *     ED Results / Procedures / Treatments   Labs (all labs ordered are listed, but only abnormal results are displayed) Labs Reviewed  BETA-HYDROXYBUTYRIC ACID - Abnormal; Notable for the following components:      Result Value   Beta-Hydroxybutyric Acid >8.00 (*)    All other components within normal limits  CBC WITH DIFFERENTIAL/PLATELET - Abnormal; Notable for the following components:   WBC 12.4 (*)    RBC 5.92 (*)    Hemoglobin 16.4 (*)    HCT 54.9 (*)    MCHC 29.9 (*)    Neutro Abs 9.4 (*)    Monocytes Absolute 1.1 (*)    Basophils Absolute 0.2 (*)    Abs Immature Granulocytes 0.22 (*)    All other components within normal limits  URINALYSIS, ROUTINE W REFLEX MICROSCOPIC - Abnormal; Notable for the following components:   Glucose, UA >=500 (*)    Hgb urine dipstick SMALL (*)    Ketones, ur 80 (*)    Protein, ur 100 (*)    All other components within normal limits  BLOOD GAS, VENOUS - Abnormal; Notable for the following components:   pH, Ven <6.8 (*)    pCO2, Ven 20.5 (*)    pO2, Ven 93.4 (*)    All other components within normal limits  CBG MONITORING, ED - Abnormal; Notable for the following components:   Glucose-Capillary >600 (*)    All other components within normal limits  I-STAT CHEM 8, ED -  Abnormal; Notable for the following components:   Sodium 131 (*)    Potassium 6.4 (*)    BUN 41 (*)    Creatinine, Ser 1.60 (*)    Glucose, Bld >700 (*)    TCO2 5 (*)    Hemoglobin 18.7 (*)    HCT 55.0 (*)    All other components within normal limits  RESP PANEL BY RT-PCR (FLU A&B, COVID) ARPGX2  BASIC METABOLIC PANEL  BASIC METABOLIC PANEL  BASIC METABOLIC PANEL  BETA-HYDROXYBUTYRIC ACID  OSMOLALITY  BLOOD GAS, VENOUS  BLOOD GAS, VENOUS  BLOOD GAS, VENOUS  BLOOD GAS, VENOUS  BLOOD GAS, VENOUS  BLOOD GAS, VENOUS  BLOOD GAS, VENOUS  BLOOD GAS, VENOUS  BLOOD GAS, VENOUS  BLOOD GAS, VENOUS  BLOOD GAS, VENOUS  BLOOD GAS, VENOUS  BASIC METABOLIC PANEL  BLOOD GAS, VENOUS  BLOOD GAS, VENOUS  CBG MONITORING, ED  POC SARS CORONAVIRUS 2 AG -  ED  POC SARS CORONAVIRUS 2 AG -  ED  TROPONIN I (HIGH SENSITIVITY)    EKG EKG Interpretation  Date/Time:  Thursday November 24 2020 12:31:30 EST Ventricular Rate:  81 PR Interval:    QRS Duration: 113 QT Interval:  428 QTC Calculation: 497 R Axis:   25 Text Interpretation: Sinus rhythm Probable left atrial enlargement Inferior infarct, acute (RCA) Anterior infarct, old No STEMI Confirmed by Alvester Chou [phone]) on 11/24/2020 1:21:29 PM   Radiology CT Head Wo Contrast  Result Date: 11/24/2020 CLINICAL DATA:  Altered mental status. EXAM: CT HEAD WITHOUT  CONTRAST TECHNIQUE: Contiguous axial images were obtained from the base of the skull through the vertex without intravenous contrast. COMPARISON:  December 04, 2019. 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 No mass effect or midline shift is noted. Ventricular size is within normal limits. There is no evidence of mass lesion, hemorrhage or acute infarction. Vascular: No hyperdense vessel or unexpected calcification. Skull: Normal. Negative for fracture or focal lesion. Sinuses/Orbits: No acute finding. Other: None. 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 No acute intracranial abnormality seen. Electronically Signed   By: Lupita Raider M.D.   On: 11/24/2020 14:04   DG Chest Portable 1 View  Result Date: 11/24/2020 CLINICAL DATA:  Hypoxia and altered mental status. EXAM: PORTABLE CHEST 1 VIEW COMPARISON:  12/04/2019 and prior radiographs FINDINGS: The cardiomediastinal silhouette is unremarkable. Equivocal airspace opacity overlying the mid RIGHT lung noted. Mild chronic interstitial opacities are again identified. There is no evidence of pleural effusion or pneumothorax IMPRESSION: Equivocal airspace opacity overlying the mid RIGHT lung, which may represent infection. Electronically Signed   By: Harmon Pier M.D.   On: 11/24/2020 13:34    Procedures .Critical Care Performed by: Terald Sleeper, MD Authorized by: Terald Sleeper, MD   Critical care provider statement:    Critical care time (minutes):  65   Critical care was necessary to treat or prevent imminent or life-threatening deterioration of the following conditions:  Metabolic crisis   Critical care was time spent personally by me on the following activities:  Discussions with consultants, evaluation of patient's response to treatment, examination of patient, ordering and performing treatments and interventions, ordering and review of laboratory studies, ordering and review of radiographic studies, pulse oximetry, re-evaluation of patient's condition, obtaining history from patient or surrogate and review of old charts Comments:     DKA   (including critical care time)  Medications Ordered in ED Medications  insulin regular, human (MYXREDLIN) 100 units/ 100 mL infusion (9.5 Units/hr Intravenous New Bag/Given 11/24/20 1323)  lactated ringers infusion (has no administration in time range)  dextrose 5 % in lactated ringers infusion (has no administration in time range)  dextrose 50 % solution 0-50 mL (has no administration in time range)  levofloxacin (LEVAQUIN) IVPB 750  mg (has no administration in time range)  sodium bicarbonate 150 mEq in sterile water 1,000 mL infusion (has no administration in time range)  lactated ringers bolus 1,000 mL (has no administration in time range)  sodium chloride 0.9 % bolus 2,000 mL (0 mLs Intravenous Stopped 11/24/20 1454)  sodium bicarbonate injection 100 mEq (100 mEq Intravenous Given 11/24/20 1308)    ED Course  I have reviewed the triage vital signs and the nursing notes.  Pertinent labs & imaging results that were available during my care of the patient were reviewed by me and considered in my medical decision making (see chart for details).  This is a type I diabetic who presents with suspected DKA, found unresponsive and obtunded in her kitchen.  Blood sugar unreadable on fingerstick glucose.  Differential diagnosis includes metabolic encephalopathy versus symptomatic anemia versus intracranial injury versus other.  Vital stable on arrival.  No acute hypoxia.  She appears to be protecting her airway.  I would avoid intubation if at all possible due to the risk of triggering hypoventilation or apnea with her severe acidosis, which may lead to cardiac arrest.    I ordered, reviewed, and interpreted labs, which included acidosis with PH < 6.8, PCO2 of  20, Covid/Flu PCR negative, UA with ketones, protein, Trop 8, Hgb 16.4, WBC 12.4,  I ordered medication IV fluids, IV insulin, IV Levaquin I ordered imaging studies which included Xray chest and CT head I independently visualized and interpreted imaging which showed right middle lobe opacification and the monitor tracing which showed sinus rhythm Previous records obtained and reviewed showing hospitalizations for recurrent DKA  After the interventions stated above, I reevaluated the patient and found some improvement in mentation, will open eyes and mumble yes/no appropriately now.  At time of admission remains in critical state, with significant acidosis, pending repeat  labs.   Clinical Course as of 11/24/20 1516  Thu Nov 24, 2020  1244 No answer from Kelby Fam significant other or Okey Regal sister listed as emergency contacts. [MT]  1304 pH, Ven(!!): <6.8 [MT]  1308 Sodium bicarb 2 amps ordered - crit care paged.  Endotool insulin ordered, along with IV fluids.   [MT]  1312 I spoke to Dr Craige Cotta from crit care who advises admission to hospitalist and they will be happy to consult as needed.  We discussed pts significant DKA, he advised continued IV fluids, insulin, bicarb is okay.  No recommendation for intubation if pt is protecting her airway.  I agree that intubation presents a high risk for apnea triggering cardiac arrest with her level of acidosis, and I would avoid this if possible at this time. [MT]  1341 IV levaquin ordered for Right sided opacity, possible aspiration PNA, with hypothermia and WBC 12.4.  Patient with PCN allergies. [MT]  1357 Pt at CT now. 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 No acute intracranial abnormality seen. [MT]  1511 Signed out to hospitalist - made aware of patient's critical illness.  Initial BMP according to our lab there was not enough blood in specimen.  Nursing drawing repeat BMP and VBG at this time and will send down.  I anticipate significant anion gap.    [MT]    Clinical Course User Index [MT] Earlyn Sylvan, Kermit Balo, MD    Final Clinical Impression(s) / ED Diagnoses Final diagnoses:  Diabetic ketoacidosis with coma associated with type 1 diabetes mellitus [facility])    Rx / DC Orders ED Discharge Orders    None       Terald Sleeper, MD 11/24/20 1517

## 2020-11-24 NOTE — ED Notes (Signed)
Date and time results received: 11/24/20 1541 (use smartphrase ".now" to insert current time)  Test: Ph Critical Value: 6.928  Name of Provider Notified: Dr. Randol Kern  Orders Received? Or Actions Taken?: Orders Received - See Orders for details

## 2020-11-24 NOTE — Consult Note (Signed)
NAME:  [name], MRN:  [id number], DOB:  [birth year], LOS: 0 ADMISSION DATE:  11/24/2020, CONSULTATION DATE:  11/24/2020 REFERRING MD:  Dr. Langston Masker, ER, CHIEF COMPLAINT:  AMS   Brief History:  70 yo female former smoker with hx of DM type 1 and recurrent DKA brought to ER with altered mental status and hypoxia with SpO2 80% on room air.  Found to have DKA, hypovolemia, and hyperkalemia.  Hx from chart and medical team.  Pt not able to provide history.  Past Medical History:  DM type 1, Pneumonia, Allergies, Schizophrenia  Significant Hospital Events:  1/06 admit  Consults:    Procedures:    Significant Diagnostic Tests:    Micro Data:  COVID Ag 1/06 >> negative COVID/Flu PCR 1/06 >> negative  Antimicrobials:     Interim History / Subjective:    Objective   Blood pressure (!) 116/47, pulse 88, temperature (!) 93.4 F (34.1 C), temperature source Rectal, resp. rate 10, SpO2 100 %.       No intake or output data in the 24 hours ending 11/24/20 1324 There were no vitals filed for this visit.  Examination:  General - seen in ER Eyes - pupils reactive ENT - edentulous, dry mucosa, positive gag reflex Cardiac - regular rate/rhythm, no murmur Chest - equal breath sounds b/l, no wheezing or rales Abdomen - soft, non tender, + bowel sounds Extremities - no cyanosis, clubbing, or edema Skin - no rashes Neuro - somnolent, withdraws and moans with stimulation, moves all extremities   Resolved Hospital Problem list     Assessment & Plan:   Acute metabolic encephalopathy from DKA with poorly controlled DM type 1. - asked ER team to contact hospitalist to arrange for admission to ICU - protecting airway and maintaining oxygenation on room at this time; don't think she requires intubation at this time - continue IV fluids and IV insulin gtt until anion gap closed  AKI from hypovolemia. - baseline creatinine 0.70 from 12/05/19 - f/u BMET, monitor urine outpt -  continue IV fluids  Hyperkalemia. 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 - warming blanket  Hx of schizophrenia. - hold outpt abilify, cogentin, wellbutrin, atarax, remeron  Hx of HTN, HLD. - hold outpt torpol xl, lipitor   Best practice (evaluated daily)  Diet: NPO DVT prophylaxis: Per primary team GI prophylaxis: N/A Mobility: Bed rest Disposition: ICU with [facility] as primary service Code Status: full code  Labs    CMP Latest Ref Rng & Units 11/24/2020 12/06/2019 12/05/2019  Glucose 70 - 99 mg/dL >700(HH) 318(H) 277(H)  BUN 8 - 23 mg/dL 41(H) 24(H) 33(H)  Creatinine 0.44 - 1.00 mg/dL 1.60(H) 0.65 0.70  Sodium 135 - 145 mmol/L 131(L) 137 138  Potassium 3.5 - 5.1 mmol/L 6.4(HH) 3.6 3.2(L)  Chloride 98 - 111 mmol/L 109 116(H) 116(H)  CO2 22 - 32 mmol/L - 17(L) 17(L)  Calcium 8.9 - 10.3 mg/dL - 8.2(L) 8.3(L)  Total Protein 6.5 - 8.1 g/dL - 5.8(L) -  Total Bilirubin 0.3 - 1.2 mg/dL - 0.8 -  Alkaline Phos 38 - 126 U/L - 95 -  AST 15 - 41 U/L - 16 -  ALT 0 - 44 U/L - 15 -    CBC Latest Ref Rng & Units 11/24/2020 11/24/2020 12/04/2019  WBC 4.0 - 10.5 K/uL - 12.4(H) 8.2  Hemoglobin 12.0 - 15.0 g/dL 18.7(H) 16.4(H) 14.9  Hematocrit 36.0 - 46.0 % 55.0(H) 54.9(H) 46.6(H)  Platelets 150 - 400 K/uL - 299 201    CBG (last 3)  Recent Labs    11/24/20 1228  GLUCAP >600*     Review of Systems:   Unable to obtain.  Past Medical History:  She,  has a past medical history of Allergy, Diabetes mellitus without complication (HCC), Mixed stress and urge urinary incontinence, Pneumonia, and Schizophrenia (HCC).   Surgical History:  No past surgical history on file.   Social History:   reports that she has quit smoking. Her smoking use included cigarettes. She smoked 3.00 packs per day. She has never used smokeless tobacco. She reports current alcohol use of  about 6.0 standard drinks of alcohol per week. She reports that she does not use drugs.   Family History:  Her family history includes Diabetes in her father; Emphysema in her paternal grandfather; Heart disease in her father and mother; Hypertension in her father; Stroke in her father.   Allergies Allergies  Allergen Reactions  . Penicillins Rash    Did it involve swelling of the face/tongue/throat, SOB, or low BP? Unknown Did it involve sudden or severe rash/hives, skin peeling, or any reaction on the inside of your mouth or nose? Unknown Did you need to seek medical attention at a hospital or doctor's office? Unknown When did it last happen? If all above answers are "NO", may proceed with cephalosporin use.      Home Medications  Prior to Admission medications   Medication Sig Start Date End Date Taking? Authorizing Provider  ARIPiprazole (ABILIFY) 20 MG tablet Take 20 mg by mouth daily.     [provider]  atorvastatin (LIPITOR) 20 MG tablet Take 20 mg by mouth daily. 11/09/19   [provider]  benztropine (COGENTIN) 1 MG tablet Take 1 mg by mouth at bedtime.     [provider]  buPROPion (WELLBUTRIN) 75 MG tablet Take 75 mg by mouth 2 (two) times daily.    [provider]  hydrOXYzine (ATARAX/VISTARIL) 25 MG tablet Take 25-50 mg by mouth at bedtime as needed for anxiety.  11/07/19   [provider]  insulin aspart (NOVOLOG) 100 UNIT/ML injection Inject three times daily with meals.  Correction coverage: Moderate (average weight, post-op) CBG < 70: implement hypoglycemia protocol CBG 70 - 120: 0 units CBG 121 - 150: 2 units CBG 151 - 200: 3 units CBG 201 - 250: 5 units CBG 251 - 300: 8 units CBG 301 - 350: 11 units CBG 351 - 400: 15 units CBG > 400: call MD and obtain STAT lab verification Patient taking differently: Inject into the skin continuous. VIa insulin pum   p Inject three times daily with meals.  Correction  coverage: Moderate (average weight, post-op) CBG < 70: implement hypoglycemia protocol CBG 70 - 120: 0 units CBG 121 - 150: 2 units CBG 151 - 200: 3 units CBG 201 - 250: 5 units CBG 251 - 300: 8 units CBG 301 - 350: 11 units CBG 351 - 400: 15 units CBG > 400: call MD 04/25/17   Tyrone Nine, MD  metoprolol succinate (TOPROL-XL) 25 MG 24 hr tablet Take 12.5 mg by mouth daily.  11/30/19   [provider]  mirtazapine (REMERON) 30 MG tablet Take 30 mg by mouth at bedtime.     [provider]  OYSCO 500 500 MG TABS Take 1 tablet by  mouth daily.  09/23/19   [provider]     Critical care time: 34 minutes  Coralyn Helling, MD [facility] Pulmonary/Critical Care Pager - [phone] 11/24/2020, 1:36 PM

## 2020-11-24 NOTE — Progress Notes (Addendum)
HR still s.tach at 120 but BP now down to 87/39.  DDx = dehydration from the metabolic abnormalities (DKA + hyperosmolarity) vs sepsis from aspiration PNA.  So far pt 3L+ since admission.  Giving another 1L LR bolus now.  K 3.0, already has 3 runs of IV K ordered, will order another 2 runs.

## 2020-11-24 NOTE — ED Notes (Signed)
CRITICAL VALUE ALERT  Critical Value:  Glucose 653  Date & Time Notied:  11/24/2020 & 1600  Provider Notified: Attending   Orders Received/Actions taken: Notified

## 2020-11-24 NOTE — Progress Notes (Signed)
Pt in SVT with HR 200-207. Hypotensive BP 77/58. Dr. Julian Reil paged. Return call received, adenosine 6mg  ordered and pushed, HR down to 110-120s sustaining. BP improved to 100/49.

## 2020-11-24 NOTE — ED Notes (Signed)
ED Provider at bedside. 

## 2020-11-25 ENCOUNTER — Inpatient Hospital Stay (HOSPITAL_COMMUNITY): Payer: Medicare Other

## 2020-11-25 DIAGNOSIS — R6521 Severe sepsis with septic shock: Secondary | ICD-10-CM

## 2020-11-25 DIAGNOSIS — E1111 Type 2 diabetes mellitus with ketoacidosis with coma: Secondary | ICD-10-CM | POA: Diagnosis not present

## 2020-11-25 DIAGNOSIS — I471 Supraventricular tachycardia: Secondary | ICD-10-CM | POA: Diagnosis not present

## 2020-11-25 DIAGNOSIS — A419 Sepsis, unspecified organism: Secondary | ICD-10-CM | POA: Diagnosis not present

## 2020-11-25 DIAGNOSIS — J189 Pneumonia, unspecified organism: Secondary | ICD-10-CM | POA: Diagnosis not present

## 2020-11-25 LAB — BLOOD GAS, VENOUS
Acid-base deficit: 3.8 mmol/L — ABNORMAL HIGH (ref 0.0–2.0)
Acid-base deficit: 1.6 mmol/L (ref 0.0–2.0)
Acid-base deficit: 0.8 mmol/L (ref 0.0–2.0)
Bicarbonate: 19.8 mmol/L — ABNORMAL LOW (ref 20.0–28.0)
Bicarbonate: 22.4 mmol/L (ref 20.0–28.0)
Bicarbonate: 23.6 mmol/L (ref 20.0–28.0)
Drawn by: 5863
Drawn by: 1528
FIO2: 36
FIO2: 36
FIO2: 32
O2 Saturation: 39.9 %
O2 Saturation: 75.3 %
O2 Saturation: 78.8 %
Patient temperature: 37
Patient temperature: 37
Patient temperature: 37
pCO2, Ven: 42.7 mmHg — ABNORMAL LOW (ref 44.0–60.0)
pCO2, Ven: 42.7 mmHg — ABNORMAL LOW (ref 44.0–60.0)
pCO2, Ven: 35.9 mmHg — ABNORMAL LOW (ref 44.0–60.0)
pH, Ven: 7.319 (ref 7.250–7.430)
pH, Ven: 7.353 (ref 7.250–7.430)
pH, Ven: 7.423 (ref 7.250–7.430)
pO2, Ven: <31 mmHg — CL (ref 32.0–45.0)
pO2, Ven: 37.5 mmHg (ref 32.0–45.0)
pO2, Ven: 41 mmHg (ref 32.0–45.0)

## 2020-11-25 LAB — CBC
HCT: 35.3 % — ABNORMAL LOW (ref 36.0–46.0)
Hemoglobin: 12.2 g/dL (ref 12.0–15.0)
MCH: 27.7 pg (ref 26.0–34.0)
MCHC: 34.6 g/dL (ref 30.0–36.0)
MCV: 80 fL (ref 80.0–100.0)
Platelets: 132 10*3/uL — ABNORMAL LOW (ref 150–400)
RBC: 4.41 MIL/uL (ref 3.87–5.11)
RDW: 14.6 % (ref 11.5–15.5)
WBC: 3.6 10*3/uL — ABNORMAL LOW (ref 4.0–10.5)
nRBC: 0 % (ref 0.0–0.2)

## 2020-11-25 LAB — BASIC METABOLIC PANEL
Anion gap: 18 — ABNORMAL HIGH (ref 5–15)
Anion gap: 11 (ref 5–15)
Anion gap: 10 (ref 5–15)
BUN: 41 mg/dL — ABNORMAL HIGH (ref 8–23)
BUN: 41 mg/dL — ABNORMAL HIGH (ref 8–23)
BUN: 40 mg/dL — ABNORMAL HIGH (ref 8–23)
CO2: 17 mmol/L — ABNORMAL LOW (ref 22–32)
CO2: 22 mmol/L (ref 22–32)
CO2: 22 mmol/L (ref 22–32)
Calcium: 8.4 mg/dL — ABNORMAL LOW (ref 8.9–10.3)
Calcium: 8 mg/dL — ABNORMAL LOW (ref 8.9–10.3)
Calcium: 7.9 mg/dL — ABNORMAL LOW (ref 8.9–10.3)
Chloride: 105 mmol/L (ref 98–111)
Chloride: 109 mmol/L (ref 98–111)
Chloride: 109 mmol/L (ref 98–111)
Creatinine, Ser: 1.61 mg/dL — ABNORMAL HIGH (ref 0.44–1.00)
Creatinine, Ser: 1.35 mg/dL — ABNORMAL HIGH (ref 0.44–1.00)
Creatinine, Ser: 1.38 mg/dL — ABNORMAL HIGH (ref 0.44–1.00)
GFR, Estimated: 35 mL/min — ABNORMAL LOW (ref >60)
GFR, Estimated: 43 mL/min — ABNORMAL LOW (ref >60)
GFR, Estimated: 42 mL/min — ABNORMAL LOW (ref >60)
Glucose, Bld: 311 mg/dL — ABNORMAL HIGH (ref 70–99)
Glucose, Bld: 248 mg/dL — ABNORMAL HIGH (ref 70–99)
Glucose, Bld: 214 mg/dL — ABNORMAL HIGH (ref 70–99)
Potassium: 3 mmol/L — ABNORMAL LOW (ref 3.5–5.1)
Potassium: 3.4 mmol/L — ABNORMAL LOW (ref 3.5–5.1)
Potassium: 3.5 mmol/L (ref 3.5–5.1)
Sodium: 140 mmol/L (ref 135–145)
Sodium: 142 mmol/L (ref 135–145)
Sodium: 141 mmol/L (ref 135–145)

## 2020-11-25 LAB — GLUCOSE, CAPILLARY
Glucose-Capillary: 123 mg/dL — ABNORMAL HIGH (ref 70–99)
Glucose-Capillary: 127 mg/dL — ABNORMAL HIGH (ref 70–99)
Glucose-Capillary: 173 mg/dL — ABNORMAL HIGH (ref 70–99)
Glucose-Capillary: 183 mg/dL — ABNORMAL HIGH (ref 70–99)
Glucose-Capillary: 184 mg/dL — ABNORMAL HIGH (ref 70–99)
Glucose-Capillary: 187 mg/dL — ABNORMAL HIGH (ref 70–99)
Glucose-Capillary: 193 mg/dL — ABNORMAL HIGH (ref 70–99)
Glucose-Capillary: 217 mg/dL — ABNORMAL HIGH (ref 70–99)
Glucose-Capillary: 232 mg/dL — ABNORMAL HIGH (ref 70–99)
Glucose-Capillary: 255 mg/dL — ABNORMAL HIGH (ref 70–99)
Glucose-Capillary: 97 mg/dL (ref 70–99)

## 2020-11-25 LAB — COMPREHENSIVE METABOLIC PANEL
ALT: 14 U/L (ref 0–44)
AST: 18 U/L (ref 15–41)
Albumin: 3.4 g/dL — ABNORMAL LOW (ref 3.5–5.0)
Alkaline Phosphatase: 94 U/L (ref 38–126)
Anion gap: 18 — ABNORMAL HIGH (ref 5–15)
BUN: 39 mg/dL — ABNORMAL HIGH (ref 8–23)
CO2: 18 mmol/L — ABNORMAL LOW (ref 22–32)
Calcium: 8.5 mg/dL — ABNORMAL LOW (ref 8.9–10.3)
Chloride: 106 mmol/L (ref 98–111)
Creatinine, Ser: 1.49 mg/dL — ABNORMAL HIGH (ref 0.44–1.00)
GFR, Estimated: 38 mL/min — ABNORMAL LOW (ref >60)
Glucose, Bld: 279 mg/dL — ABNORMAL HIGH (ref 70–99)
Potassium: 3.3 mmol/L — ABNORMAL LOW (ref 3.5–5.1)
Sodium: 142 mmol/L (ref 135–145)
Total Bilirubin: 1.2 mg/dL (ref 0.3–1.2)
Total Protein: 6.5 g/dL (ref 6.5–8.1)

## 2020-11-25 LAB — PROCALCITONIN: Procalcitonin: 9.4 ng/mL

## 2020-11-25 LAB — PROTIME-INR
INR: 1 (ref 0.8–1.2)
Prothrombin Time: 13 seconds (ref 11.4–15.2)

## 2020-11-25 LAB — CBC WITH DIFFERENTIAL/PLATELET
Abs Immature Granulocytes: 0.04 10*3/uL (ref 0.00–0.07)
Basophils Absolute: 0 10*3/uL (ref 0.0–0.1)
Basophils Relative: 1 %
Eosinophils Absolute: 0 10*3/uL (ref 0.0–0.5)
Eosinophils Relative: 0 %
HCT: 37.3 % (ref 36.0–46.0)
Hemoglobin: 12.9 g/dL (ref 12.0–15.0)
Immature Granulocytes: 2 %
Lymphocytes Relative: 16 %
Lymphs Abs: 0.4 10*3/uL — ABNORMAL LOW (ref 0.7–4.0)
MCH: 27.7 pg (ref 26.0–34.0)
MCHC: 34.6 g/dL (ref 30.0–36.0)
MCV: 80.2 fL (ref 80.0–100.0)
Monocytes Absolute: 0.2 10*3/uL (ref 0.1–1.0)
Monocytes Relative: 9 %
Neutro Abs: 2 10*3/uL (ref 1.7–7.7)
Neutrophils Relative %: 72 %
Platelets: 168 10*3/uL (ref 150–400)
RBC: 4.65 MIL/uL (ref 3.87–5.11)
RDW: 14.5 % (ref 11.5–15.5)
WBC: 2.8 10*3/uL — ABNORMAL LOW (ref 4.0–10.5)
nRBC: 0 % (ref 0.0–0.2)

## 2020-11-25 LAB — TROPONIN I (HIGH SENSITIVITY)
Troponin I (High Sensitivity): 18 ng/L — ABNORMAL HIGH (ref <18)
Troponin I (High Sensitivity): 28 ng/L — ABNORMAL HIGH (ref <18)

## 2020-11-25 LAB — BETA-HYDROXYBUTYRIC ACID
Beta-Hydroxybutyric Acid: 6.41 mmol/L — ABNORMAL HIGH (ref 0.05–0.27)
Beta-Hydroxybutyric Acid: 0.52 mmol/L — ABNORMAL HIGH (ref 0.05–0.27)

## 2020-11-25 LAB — APTT: aPTT: 31 seconds (ref 24–36)

## 2020-11-25 LAB — LACTIC ACID, PLASMA
Lactic Acid, Venous: 3.8 mmol/L (ref 0.5–1.9)
Lactic Acid, Venous: 2.2 mmol/L (ref 0.5–1.9)

## 2020-11-25 LAB — MRSA PCR SCREENING: MRSA by PCR: NEGATIVE

## 2020-11-25 MED ORDER — INSULIN ASPART 100 UNIT/ML ~~LOC~~ SOLN
0.0000 [IU] | SUBCUTANEOUS | Status: DC
  Administered 2020-11-25 (×2): 2 [IU] via SUBCUTANEOUS
  Administered 2020-11-26: 5 [IU] via SUBCUTANEOUS
  Administered 2020-11-26 (×2): 2 [IU] via SUBCUTANEOUS
  Administered 2020-11-26: 3 [IU] via SUBCUTANEOUS
  Administered 2020-11-26 (×2): 2 [IU] via SUBCUTANEOUS
  Administered 2020-11-26: 3 [IU] via SUBCUTANEOUS
  Administered 2020-11-27: 5 [IU] via SUBCUTANEOUS
  Administered 2020-11-27: 7 [IU] via SUBCUTANEOUS
  Administered 2020-11-27 (×2): 5 [IU] via SUBCUTANEOUS
  Administered 2020-11-27: 3 [IU] via SUBCUTANEOUS
  Administered 2020-11-27: 5 [IU] via SUBCUTANEOUS
  Administered 2020-11-28 (×2): 9 [IU] via SUBCUTANEOUS
  Administered 2020-11-28: 7 [IU] via SUBCUTANEOUS
  Administered 2020-11-28: 5 [IU] via SUBCUTANEOUS
  Administered 2020-11-28: 9 [IU] via SUBCUTANEOUS
  Administered 2020-11-28 – 2020-11-29 (×2): 3 [IU] via SUBCUTANEOUS
  Administered 2020-11-29: 5 [IU] via SUBCUTANEOUS

## 2020-11-25 MED ORDER — NOREPINEPHRINE 4 MG/250ML-% IV SOLN
0.0000 ug/min | INTRAVENOUS | Status: DC
  Administered 2020-11-25: 16 ug/min via INTRAVENOUS
  Filled 2020-11-25 (×3): qty 250

## 2020-11-25 MED ORDER — VANCOMYCIN HCL 1250 MG/250ML IV SOLN
1250.0000 mg | Freq: Once | INTRAVENOUS | Status: AC
  Administered 2020-11-25: 1250 mg via INTRAVENOUS
  Filled 2020-11-25: qty 250

## 2020-11-25 MED ORDER — CHLORHEXIDINE GLUCONATE CLOTH 2 % EX PADS
6.0000 | MEDICATED_PAD | Freq: Every day | CUTANEOUS | Status: DC
  Administered 2020-11-25 – 2020-11-29 (×5): 6 via TOPICAL

## 2020-11-25 MED ORDER — VANCOMYCIN HCL IN DEXTROSE 1-5 GM/200ML-% IV SOLN: 1000.0000 mg | Freq: Once | INTRAVENOUS | Status: DC

## 2020-11-25 MED ORDER — INSULIN GLARGINE 100 UNIT/ML ~~LOC~~ SOLN
16.0000 [IU] | Freq: Every day | SUBCUTANEOUS | Status: DC
  Administered 2020-11-25 – 2020-11-27 (×3): 16 [IU] via SUBCUTANEOUS
  Filled 2020-11-25 (×4): qty 0.16

## 2020-11-25 MED ORDER — PHENYLEPHRINE HCL-NACL 10-0.9 MG/250ML-% IV SOLN
0.0000 ug/min | INTRAVENOUS | Status: DC
  Administered 2020-11-25: 30 ug/min via INTRAVENOUS
  Administered 2020-11-25 – 2020-11-26 (×3): 20 ug/min via INTRAVENOUS
  Administered 2020-11-26: 30 ug/min via INTRAVENOUS
  Filled 2020-11-25 (×4): qty 250

## 2020-11-25 MED ORDER — POTASSIUM CHLORIDE 10 MEQ/100ML IV SOLN
10.0000 meq | INTRAVENOUS | Status: AC
Start: 2020-11-25 — End: 2020-11-25
  Administered 2020-11-25: 10 meq via INTRAVENOUS
  Filled 2020-11-25 (×3): qty 100

## 2020-11-25 MED ORDER — VANCOMYCIN HCL IN DEXTROSE 1-5 GM/200ML-% IV SOLN
1000.0000 mg | INTRAVENOUS | Status: DC
  Filled 2020-11-25 (×2): qty 200

## 2020-11-25 MED ORDER — SODIUM CHLORIDE 0.9 % IV SOLN
1.0000 g | Freq: Three times a day (TID) | INTRAVENOUS | Status: DC
  Filled 2020-11-25 (×11): qty 1

## 2020-11-25 MED ORDER — HYDROCORTISONE NA SUCCINATE PF 100 MG IJ SOLR
50.0000 mg | Freq: Four times a day (QID) | INTRAMUSCULAR | Status: DC
  Administered 2020-11-25 – 2020-11-28 (×15): 50 mg via INTRAVENOUS
  Filled 2020-11-25 (×15): qty 2

## 2020-11-25 MED ORDER — NOREPINEPHRINE 4 MG/250ML-% IV SOLN: 0.0000 ug/min | INTRAVENOUS | Status: DC

## 2020-11-25 MED ORDER — POTASSIUM CHLORIDE 10 MEQ/100ML IV SOLN
INTRAVENOUS | Status: AC
  Administered 2020-11-25: 10 meq via INTRAVENOUS
  Filled 2020-11-25: qty 100

## 2020-11-25 MED ORDER — METRONIDAZOLE IN NACL 5-0.79 MG/ML-% IV SOLN
500.0000 mg | Freq: Three times a day (TID) | INTRAVENOUS | Status: DC
  Administered 2020-11-25 – 2020-11-28 (×10): 500 mg via INTRAVENOUS
  Filled 2020-11-25 (×10): qty 100

## 2020-11-25 MED ORDER — PHENYLEPHRINE HCL (PRESSORS) 10 MG/ML IV SOLN
INTRAVENOUS | Status: AC
  Filled 2020-11-25: qty 1

## 2020-11-25 MED ORDER — LIDOCAINE HCL (PF) 1 % IJ SOLN
INTRAMUSCULAR | Status: AC
  Filled 2020-11-25: qty 5

## 2020-11-25 MED ORDER — SODIUM CHLORIDE 0.9 % IV SOLN
2.0000 g | Freq: Two times a day (BID) | INTRAVENOUS | Status: DC
  Administered 2020-11-25 – 2020-11-28 (×7): 2 g via INTRAVENOUS
  Filled 2020-11-25 (×7): qty 2

## 2020-11-25 NOTE — Progress Notes (Signed)
Inpatient Diabetes Program Recommendations  AACE/ADA: New Consensus Statement on Inpatient Glycemic Control   Target Ranges:  Prepandial:   less than 140 mg/dL      Peak postprandial:   less than 180 mg/dL (1-2 hours)      Critically ill patients:  140 - 180 mg/dL   Results for [name], [name] (MRN [id number]) as of 11/25/2020 07:54  Ref. Range 11/25/2020 00:01 11/25/2020 01:58 11/25/2020 03:13 11/25/2020 05:14 11/25/2020 07:41  Glucose-Capillary Latest Ref Range: 70 - 99 mg/dL 008 (H) 676 (H) 195 (H) 184 (H) 187 (H)  Results for [name], [name] (MRN [id number]) as of 11/25/2020 07:54  Ref. Range 11/24/2020 12:36 11/24/2020 12:42 11/24/2020 15:15 11/24/2020 16:33  Beta-Hydroxybutyric Acid Latest Ref Range: 0.05 - 0.27 mmol/L >8.00 (H)   >8.00 (H)  Glucose Latest Ref Range: 70 - 99 mg/dL  >580 (HH) 998 (HH) 338 (HH)  Hemoglobin A1C Latest Ref Range: 4.8 - 5.6 %    13.3 (H)   Review of Glycemic Control  Diabetes history: DM1 Outpatient Diabetes medications: V-Go 20 insulin pump with Novolog (provides Zamora total of 20 units of basal per 24 hours), 3 clicks with meals (each click is 2 units of insulin), Tresiba 18 units daily Current orders for Inpatient glycemic control: IV insulin  Inpatient Diabetes Program Recommendations:    Insulin: Once MD is ready to transition from IV to SQ insulin, please consider ordering Lantus 20 units Q24H, CBGs Q4H, Novolog 0-9 units Q4H. Once diet ordered, patient will also need meal coverage insulin ordered (may want to consider ordering Novolog 4 units TID with meals for meal coverage if patient eats at least 50% of meals).  NOTE: In reviewing the chart noted patient has had 5 hospital admissions with [facility] system in 2021 for DKA.  Noted office visit with Dr. Shawnee Knapp on 09/20/20 which notes due to multiple hospital admissions with DKA, not checking glucose, missing insulin dosages, and running out of insulin patient was asked not to use V-Go insulin pump but was prescribed  Tresiba 18 units daily and Novolog 6 units with meals and 2 units with snacks.  Patient was to meet with Zamora V-Go trainer to determine if resuming V-Go was appropriate for patient. Per most recent hospital admission 10/27/20-10/30/20 at [facility] it was noted that patient has cognitive decline, patient not checking glucose, and not taking boluses with pump.  A1C was 15.1% on 10/27/20 in Care Everywhere and currently 13.3% on 11/24/2020. Per H&P on 11/24/20, patient's significant other went to fill insulin pump and found it empty (but reports there was some insulin in it yesterday). The V-Go insulin pump is Zamora disposable insulin pump used for 24 hours and has to be changed out daily. The V-Go 20 delivers 20 units of basal insulin over 24 hour period and per chart patient was suppose to take 3 clicks per meal (each click is 2 units of insulin). Given multiple hospital admissions for DKA, patient's noncompliance with glucose monitoring and bolusing for meals, A1C, and noted cognitive decline, would not recommend patient resume her insulin pump (also recommendation of her Endocrinologist Dr. Shawnee Knapp per last office visit note on 09/20/2020).   Thanks, Orlando Penner, RN, MSN, CDE Diabetes Coordinator Inpatient Diabetes Program [phone] (Team Pager from 8am to 5pm)

## 2020-11-25 NOTE — Progress Notes (Signed)
[facility]  CVL in place and no ptx. Is in the atrium and could be contributing to tachycardia. Will withdrawal some later this AM.  Algis Greenhouse, MD [facility] [address] [place], [place] [address] [phone] (office)

## 2020-11-25 NOTE — Progress Notes (Addendum)
CVP = 9, so pt is adequately volume resuscitated.  At Dr. Laural Benes recommendation: will order phenylephrine, try to titrate up on phenylephrine and down / off of the levophed to see if this improves the tachycardia.  Other studies and labs from earlier are reviewed and are consistent with sepsis / septic shock quite possibly from aspiration PNA source: 1) line placement CXR shows "Progressive right mid lung zone consolidation." 2) Procalcitonin elevated at 9.4 3) trop is only 18 4) VBG with very low PvO2

## 2020-11-25 NOTE — Progress Notes (Signed)
MD Julian Reil at bedside to evaluate.

## 2020-11-25 NOTE — Consult Note (Signed)
NAME:  [name], MRN:  [id number], DOB:  05-Apr-1951, LOS: 1 ADMISSION DATE:  11/24/2020, CONSULTATION DATE:  11/24/2020 REFERRING MD:  Dr. Renaye Rakers, ER, CHIEF COMPLAINT:  AMS   Brief History:  70 yo female former smoker with hx of DM type 1 and recurrent DKA brought to ER with altered mental status and hypoxia with SpO2 80% on room air.  Found to have DKA, hypovolemia, and hyperkalemia.  Hx from chart and medical team.  Pt not able to provide history.  Past Medical History:  DM type 1, Pneumonia, Allergies, Schizophrenia  Significant Hospital Events:  1/06 admit 1/07 start pressors  Consults:    Procedures:  Rt IJ CVL 1/07 >>   Significant Diagnostic Tests:    Micro Data:  COVID Ag 1/06 >> negative COVID/Flu PCR 1/06 >> negative MRSA PCR 1/06 >> negative Blood 1/07 >>  Antimicrobials:  Cefepime 1/06 >> Flagyl 1/06 >>   Interim History / Subjective:  Remains on pressors.  More alert.  Has cough and chest congestion.  Denies chest/abdominal pain.  Objective   Blood pressure (!) 111/46, pulse 98, temperature 99.9 F (37.7 C), temperature source Axillary, resp. rate (!) 24, SpO2 100 %. CVP:  [0 mmHg-15 mmHg] 7 mmHg      Intake/Output Summary (Last 24 hours) at 11/25/2020 1437 Last data filed at 11/25/2020 0343 Gross per 24 hour  Intake 2453.6 ml  Output 325 ml  Net 2128.6 ml   There were no vitals filed for this visit.  Examination:  General - alert Eyes - pupils reactive ENT - no sinus tenderness, no stridor, dry mucosa Cardiac - regular rate/rhythm, no murmur Chest - b/l rhonchi Rt > Lt Abdomen - soft, non tender, + bowel sounds Extremities - no cyanosis, clubbing, or edema Skin - no rashes Neuro - moves extremities, follows commands  Resolved Hospital Problem list   Hyperkalemia, Lactic acidosis, Polycythemia from hemoconcentration, Hypothermia  Assessment & Plan:   Acute metabolic encephalopathy from DKA with poorly controlled DM type 1. -  per primary team  Sepsis from community acquired pneumonia in Rt lung. - hx of PCN allergy - day 2 of ABx - likely can wean off pressors later today - continue IV fluids  AKI from hypovolemia. - baseline creatinine 0.70 from 12/05/19 - f/u BMET, monitor urine outpt - continue IV fluids  Hx of schizophrenia. - hold outpt abilify, cogentin, wellbutrin, atarax, remeron  Hx of HTN, HLD. - hold outpt torpol xl, lipitor   Best practice  Diet: NPO DVT prophylaxis: SQ heparin GI prophylaxis: N/A Mobility: Bed rest Disposition: ICU Code Status: full code  Labs    CMP Latest Ref Rng & Units 11/25/2020 11/25/2020 11/25/2020  Glucose 70 - 99 mg/dL 951(O) 841(Y) 606(T)  BUN 8 - 23 mg/dL 01(S) 01(U) 93(A)  Creatinine 0.44 - 1.00 mg/dL 3.55(D) 3.22(G) 2.54(Y)  Sodium 135 - 145 mmol/L 141 142 142  Potassium 3.5 - 5.1 mmol/L 3.5 3.4(L) 3.3(L)  Chloride 98 - 111 mmol/L 109 109 106  CO2 22 - 32 mmol/L 22 22 18(L)  Calcium 8.9 - 10.3 mg/dL 7.9(L) 8.0(L) 8.5(L)  Total Protein 6.5 - 8.1 g/dL - - 6.5  Total Bilirubin 0.3 - 1.2 mg/dL - - 1.2  Alkaline Phos 38 - 126 U/L - - 94  AST 15 - 41 U/L - - 18  ALT 0 - 44 U/L - - 14    CBC Latest Ref Rng & Units 11/25/2020 11/25/2020 11/24/2020  WBC 4.0 - 10.5  K/uL 3.6(L) 2.8(L) -  Hemoglobin 12.0 - 15.0 g/dL 02.2 33.6 18.7(H)  Hematocrit 36.0 - 46.0 % 35.3(L) 37.3 55.0(H)  Platelets 150 - 400 K/uL 132(L) 168 -    CBG (last 3)  Recent Labs    11/25/20 1016 11/25/20 1148 11/25/20 1419  GLUCAP 127* 97 123*    Signature:  Coralyn Helling, MD [facility] Pulmonary/Critical Care Pager - [phone] 11/25/2020, 2:37 PM

## 2020-11-25 NOTE — Progress Notes (Addendum)
Despite 4th 1L bolus: HR 120 (s.tach) SBP was hanging around the 90s systolic for a while but now has dropped to 72/35.  No peripheral edema.  satting 100% on just 4L.  Pt does remain lethargic / obtunded but this appears to be unchanged since presentation / admission based on prior notes.  Concerned pt possibly becoming septic possibly due to aspiration PNA (seen on initial CXR).  1) Giving 4th L LR 2) starting levophed -> post levo pressure looks much better, but now on levo through a peripheral. 3) ordering sepsis pathway 4) empiric stress dose steroids (first dose was given earlier in ED) 5) Sepsis labs and BCx 6) procalcitonin, troponin 7) empiric aztreonam, flagyl, and vanc (h/o MRSA colonization, h/o staph aureus PNA in past, allergy to PCN).  Will update PCCM. -> spoke with Dr. Arsenio Loader.  Dr. Henreitta Leber coming in to put in central line.  Get CVP after central line placed -> more IVF vs switch levo to neo for tachycardia.  Tracheal aspirate obtained: looks purulent, though pt did wake up a bit and respond so seems to be guarding airway.  CRITICAL CARE Performed by: Hillary Bow.   Total critical care time: 60 minutes  Critical care time was exclusive of separately billable procedures and treating other patients.  Critical care was necessary to treat or prevent imminent or life-threatening deterioration.  Critical care was time spent personally by me on the following activities: development of treatment plan with patient and/or surrogate as well as nursing, discussions with consultants, evaluation of patient's response to treatment, examination of patient, obtaining history from patient or surrogate, ordering and performing treatments and interventions, ordering and review of laboratory studies, ordering and review of radiographic studies, pulse oximetry and re-evaluation of patient's condition.

## 2020-11-25 NOTE — Progress Notes (Signed)
[facility]  Using a sterile gloves and equipment the central line was backed up 4cm. It was resecured with 0 Silk after injecting 1% lidocaine at the sites.  It was secured in 4 places.  CXR is ordered to confirm location. Patient tolerated well. New sterile dressing placed with biopatch.  Algis Greenhouse, MD [facility] [address] [place], [place] [address] [phone] (office)

## 2020-11-25 NOTE — Procedures (Signed)
Procedure Note  11/25/20    Preoperative Diagnosis: Hypotension with severe sepsis with shock, DKA, and renal insufficiency    Postoperative Diagnosis: Same   Procedure(s) Performed: Central Line placement, right internal jugular    Surgeon: Leatrice Jewels. Henreitta Leber, MD   Assistants: None   Anesthesia: 1% lidocaine    Complications: None    Indications: [name] is a 70 y.o. with DKA, shock, renal insufficiency, and presumed to have sepsis from PNA. I discussed the risk and benefits of placement of the central line with her domestic partner, [name] who helps her make decisions and discussed the risk, including but not limited to bleeding, infection, and risk of pneumothorax. Richard has given verbal telephone consent for the procedure.    Procedure: The patient placed supine. The right chest and neck was prepped and draped in the usual sterile fashion.  Wearing full gown and gloves, I performed the procedure.  One percent lidocaine was used for local anesthesia.  The needle with syringe was advanced and attempts at accessing the subclavian vein were unsuccessful and aborted. An ultrasound was utilized to assess the jugular vein. The jugular vein was accessed with dark venous return, and a wire was placed using the Seldinger technique without difficulty.  Ectopia was noted and the wire was pulled back some. The skin was knicked and a dilator was placed, and the three lumen catheter was placed over the wire with continued control of the wire.  There was good draw back of blood from all three lumens and each flushed easily with saline.  The catheter was secured in 4 points with 2-0 silk and a biopatch and dressing was placed.     The patient tolerated the procedure well, and the CXR was ordered to confirm position of the central line.   Algis Greenhouse, MD [facility] [address] [place], [place] [address] [phone] (office)

## 2020-11-25 NOTE — Sepsis Progress Note (Signed)
Monitoring for sepsis protocol. °

## 2020-11-25 NOTE — Progress Notes (Signed)
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 In the emergency room, spouse who is with patient states that she uses an insulin pump but when he went to fill it today, found that it was empty but reports that there was some insulin in the yesterday. CBGs are usually elevated in the 300s, but patient has been more drowsy and lethargic over the past 24 hours. In the emergency room, patient noted to be somewhat obtunded and in severe DKA with a pH of less than 6.8, potassium of 4 with peaked T waves on EKG and a glucose of greater than 700. Patient started on IV fluids and insulin drip. Patient also noted to have developing infiltrate on x-ray and hypoxia. Suspected of aspiration pneumonia. Critical care consulted. Admitted to the hospitalist service.   That evening, patient went into SVT requiring adenosine. Able to be converted back to normal sinus rhythm with adenosine. At this point, felt to be going into septic shock. General surgery placed central line. Labs already been noted lactic acidosis and procalcitonin and with worsening hypotension, felt to be going into septic shock. Started on stress dose steroids as well. Following conversion to normal sinus rhythm, remains tachycardic. An x-ray revealed that patient's central line was located in the atria which may be contributing to her tachycardia and on the morning of 1/7, general surgery pulled back line slightly.   As day has progressed, patient able to be weaned off of insulin drip, but she is still minimally responsive. Discussed with diabetic educator and she has been started on Lantus plus every 4  hours sliding scale. Blood pressures have also slowly improved and working to wean off of pressor support. Patient unable to give me any kind of subjective information  Assessment/Plan: Active Problems:   Diabetic ketoacidosis associated with type 2 diabetes mellitus, uncontrolled (HCC): Secondary to sepsis. DKA now resolved with IV fluids and insulin drip. Still not awake enough to take p.o. so getting some Lantus and every 4 sliding scale. Diabetic educator will continue to follow. A1c at 13.3, which is actual improvement from a few months ago when it was at 15.    Mood disorder (HCC): History of schizophrenia: Continue oral medications when she is awake enough to take them.    Severe sepsis with septic shock (HCC) from aspiration pneumonia: Patient meets criteria given persistent hypotension despite fluids requiring pressor support and acute respiratory failure with hypoxia secondary to aspiration pneumonia. Severe sepsis likely present on admission with septic shock developing following admission.   AKI (acute kidney injury) [facility]): Slowly improving. Creatinine a year ago within normal limits.   Dehydration    Acute metabolic encephalopathy: Continues to remain obtunded. Hopefully will wake up as infection improves.    Chronic systolic CHF (congestive heart failure) [facility]): Monitor given aggressive fluid resuscitation. When she is more stable, can check BNP and start diuresis.    SVT (supraventricular tachycardia) (HCC): Resolved with adenosine. Likely secondary to electrolyte shifts and underlying cardiac disease.   Code Status: Full code   Family Communication: Left message for husband   Disposition Plan: Still critically ill.      Consultants:  Critical care   Procedures:  Central line placement   Antimicrobials:  IV Levaquin 1/6 x 1 dose  IV Vanco 1/7-only  IV Azactam 1/7 x 1 dose  IV cefepime 1/7-present  IV Flagyl 1/7-Present  DVT prophylaxis:   SCD's   Objective: Vitals:   11/25/20 1100 11/25/20 1300  BP: (!) 91/43 (!) 111/46  Pulse: (!) 110 98  Resp: 12 (!) 24  Temp: 99.9 F (37.7 C)   SpO2: 100% 100%    Intake/Output Summary (Last 24 hours) at 11/25/2020 1506 Last data filed at 11/25/2020 1436 Gross per 24 hour  Intake 1203.6 ml  Output 325 ml  Net 878.6 ml   There were no vitals filed for this visit. There is no height or weight on file to calculate BMI.  Exam:   General:  Somnolent, mumbles a few words, not waking up  HEENT: Normocephalic, atraumatic, mucous membranes are dry   Cardiovascular: Reg rate and rhythm, borderline tachycardia   Respiratory: Clear to auscultation bilaterally   Abdomen: Soft, non-tender, non-distended +BS   Musculoskeletal: No clubbing, cyanosis or edema   Skin: No skin breaks, tears or lesions  Psychiatry: Confused, somnolent.    Data Reviewed: CBC: Recent Labs  Lab 11/24/20 1236 11/24/20 1242 11/25/20 0437 11/25/20 0719  WBC 12.4*  --  2.8* 3.6*  NEUTROABS 9.4*  --  2.0  --   HGB 16.4* 18.7* 12.9 12.2  HCT 54.9* 55.0* 37.3 35.3*  MCV 92.7  --  80.2 80.0  PLT 299  --  168 132*   Basic Metabolic Panel: Recent Labs  Lab 11/24/20 1633 11/24/20 2029 11/24/20 2306 11/25/20 0152 11/25/20 0437 11/25/20 0719  NA 141 142 140 142 142 141  K 4.5 3.7 3.0* 3.3* 3.4* 3.5  CL 107 111 105 106 109 109  CO2 <7* 11* 17* 18* 22 22  GLUCOSE 615* 382* 311* 279* 248* 214*  BUN 40* 40* 41* 39* 41* 40*  CREATININE 1.95* 1.76* 1.61* 1.49* 1.35* 1.38*  CALCIUM 8.5* 8.2* 8.4* 8.5* 8.0* 7.9*  MG 2.4  --   --   --   --   --   PHOS 5.2*  --   --   --   --   --    GFR: CrCl cannot be calculated (Unknown ideal weight.). Liver Function Tests: Recent Labs  Lab 11/25/20 0152  AST 18  ALT 14  ALKPHOS 94  BILITOT 1.2  PROT 6.5  ALBUMIN 3.4*   No results for input(s): LIPASE, AMYLASE in the last 168 hours. No results for input(s): AMMONIA in the last 168 hours. Coagulation  Profile: Recent Labs  Lab 11/25/20 0152  INR 1.0   Cardiac Enzymes: No results for input(s): CKTOTAL, CKMB, CKMBINDEX, TROPONINI in the last 168 hours. BNP (last 3 results) No results for input(s): PROBNP in the last 8760 hours. HbA1C: Recent Labs    11/24/20 1633  HGBA1C 13.3*   CBG: Recent Labs  Lab 11/25/20 0514 11/25/20 0741 11/25/20 1016 11/25/20 1148 11/25/20 1419  GLUCAP 184* 187* 127* 97 123*   Lipid Profile: No results for input(s): CHOL, HDL, LDLCALC, TRIG, CHOLHDL, LDLDIRECT in the last 72 hours. Thyroid Function Tests: No results for input(s): TSH, T4TOTAL, FREET4, T3FREE, THYROIDAB in the last 72 hours. Anemia Panel: No results for input(s): VITAMINB12, FOLATE, FERRITIN, TIBC, IRON, RETICCTPCT in the last 72 hours. Urine analysis:    Component Value Date/Time   COLORURINE YELLOW 11/24/2020 1236   APPEARANCEUR CLEAR 11/24/2020 1236   LABSPEC 1.020 11/24/2020 1236   PHURINE  5.0 11/24/2020 1236   GLUCOSEU >=500 (A) 11/24/2020 1236   HGBUR SMALL (A) 11/24/2020 1236   BILIRUBINUR NEGATIVE 11/24/2020 1236   BILIRUBINUR neg 05/22/2014 0935   KETONESUR 80 (A) 11/24/2020 1236   PROTEINUR 100 (A) 11/24/2020 1236   UROBILINOGEN 0.2 10/14/2014 0545   NITRITE NEGATIVE 11/24/2020 1236   LEUKOCYTESUR NEGATIVE 11/24/2020 1236   Sepsis Labs: @LABRCNTIP (procalcitonin:4,lacticidven:4)  ) Recent Results (from the past 240 hour(s))  Resp Panel by RT-PCR (Flu A&B, Covid) Nasopharyngeal Swab     Status: None   Collection Time: 11/24/20 12:37 PM   Specimen: Nasopharyngeal Swab; Nasopharyngeal(NP) swabs in vial transport medium  Result Value Ref Range Status   SARS Coronavirus 2 by RT PCR NEGATIVE NEGATIVE Final    Comment: (NOTE) SARS-CoV-2 target nucleic acids are NOT DETECTED.  The SARS-CoV-2 RNA is generally detectable in upper respiratory specimens during the acute phase of infection. The lowest concentration of SARS-CoV-2 viral copies this assay can detect  is 138 copies/mL. A negative result does not preclude SARS-Cov-2 infection and should not be used as the sole basis for treatment or other patient management decisions. A negative result may occur with  improper specimen collection/handling, submission of specimen other than nasopharyngeal swab, presence of viral mutation(s) within the areas targeted by this assay, and inadequate number of viral copies(<138 copies/mL). A negative result must be combined with clinical observations, patient history, and epidemiological information. The expected result is Negative.  Fact Sheet for Patients:  EntrepreneurPulse.com.au  Fact Sheet for Healthcare Providers:  IncredibleEmployment.be  This test is no t yet approved or cleared by the [place] FDA and  has been authorized for detection and/or diagnosis of SARS-CoV-2 by FDA under an Emergency Use Authorization (EUA). This EUA will remain  in effect (meaning this test can be used) for the duration of the COVID-19 declaration under Section 564(b)(1) of the Act, 21 U.S.C.section 360bbb-3(b)(1), unless the authorization is terminated  or revoked sooner.       Influenza A by PCR NEGATIVE NEGATIVE Final   Influenza B by PCR NEGATIVE NEGATIVE Final    Comment: (NOTE) The Xpert Xpress SARS-CoV-2/FLU/RSV plus assay is intended as an aid in the diagnosis of influenza from Nasopharyngeal swab specimens and should not be used as a sole basis for treatment. Nasal washings and aspirates are unacceptable for Xpert Xpress SARS-CoV-2/FLU/RSV testing.  Fact Sheet for Patients: EntrepreneurPulse.com.au  Fact Sheet for Healthcare Providers: IncredibleEmployment.be  This test is not yet approved or cleared by the [place] FDA and has been authorized for detection and/or diagnosis of SARS-CoV-2 by FDA under an Emergency Use Authorization (EUA). This EUA will remain in effect  (meaning this test can be used) for the duration of the COVID-19 declaration under Section 564(b)(1) of the Act, 21 U.S.C. section 360bbb-3(b)(1), unless the authorization is terminated or revoked.  Performed at [facility], [address], [place], [place] [address]   MRSA PCR Screening     Status: None   Collection Time: 11/24/20 10:04 PM   Specimen: Nasal Mucosa; Nasopharyngeal  Result Value Ref Range Status   MRSA by PCR NEGATIVE NEGATIVE Final    Comment:        The GeneXpert MRSA Assay (FDA approved for NASAL specimens only), is one component of a comprehensive MRSA colonization surveillance program. It is not intended to diagnose MRSA infection nor to guide or monitor treatment for MRSA infections. Performed at [facility], [address], [place], [place] [address]   Culture, blood (x  2)     Status: None (Preliminary result)   Collection Time: 11/25/20  1:44 AM   Specimen: BLOOD LEFT HAND  Result Value Ref Range Status   Specimen Description BLOOD LEFT HAND  Final   Special Requests   Final    BOTTLES DRAWN AEROBIC AND ANAEROBIC Blood Culture adequate volume   Culture   Final    NO GROWTH < 12 HOURS Performed at [facility], [address] [place], [place] [address]    Report Status PENDING  Incomplete  Culture, blood (x 2)     Status: None (Preliminary result)   Collection Time: 11/25/20  1:52 AM   Specimen: BLOOD RIGHT HAND  Result Value Ref Range Status   Specimen Description BLOOD RIGHT HAND  Final   Special Requests   Final    BOTTLES DRAWN AEROBIC AND ANAEROBIC Blood Culture adequate volume   Culture   Final    NO GROWTH < 12 HOURS Performed at [facility], [address] [place], [place] [address]    Report Status PENDING  Incomplete      Studies: DG Chest Port 1 View  Result Date: 11/25/2020 CLINICAL DATA:  Central line ingested. EXAM: PORTABLE CHEST 1 VIEW COMPARISON:  Earlier today. FINDINGS: The right jugular catheter has been retracted with  its tip in the inferior aspect of the superior vena cava. Due to bullous changes at the right lung apex, a small pneumothorax is difficult to exclude. Stable right mid and upper lung zone airspace opacity. Stable mild prominence of the interstitial markings. No pleural fluid. Stable mildly enlarged cardiac silhouette. Diffuse osteopenia. IMPRESSION: 1. Right jugular catheter tip in the inferior aspect of the superior vena cava. 2. A small right apical pneumothorax cannot be excluded due to bullous changes. 3. Stable right mid and upper lung zone probable pneumonia. 4. Stable mild cardiomegaly and mild chronic interstitial lung disease. Electronically Signed   By: Beckie Salts M.D.   On: 11/25/2020 10:36   DG CHEST PORT 1 VIEW  Result Date: 11/25/2020 CLINICAL DATA:  Central venous catheter placement EXAM: PORTABLE CHEST 1 VIEW COMPARISON:  11/24/2020 FINDINGS: Right internal jugular central venous catheter tip is seen within the right atrium. No pneumothorax. Progressive consolidation within the left mid and upper lung zone peripherally. Superimposed chronic interstitial changes are again noted. No pleural effusion. Cardiac size within normal limits. IMPRESSION: Right internal jugular central venous catheter tip within the right atrium. No pneumothorax. Progressive right mid lung zone consolidation. Electronically Signed   By: Helyn Numbers MD   On: 11/25/2020 04:13    Scheduled Meds: . Chlorhexidine Gluconate Cloth  6 each Topical Daily  . heparin  5,000 Units Subcutaneous Q8H  . hydrocortisone sod succinate (SOLU-CORTEF) inj  50 mg Intravenous Q6H  . insulin aspart  0-9 Units Subcutaneous Q4H  . insulin glargine  16 Units Subcutaneous QHS    Continuous Infusions: . ceFEPime (MAXIPIME) IV 2 g (11/25/20 1436)  . lactated ringers 150 mL/hr at 11/24/20 1536  . metronidazole 500 mg (11/25/20 1020)  . norepinephrine (LEVOPHED) Adult infusion 14 mcg/min (11/25/20 0646)  . phenylephrine  (NEO-SYNEPHRINE) Adult infusion 20 mcg/min (11/25/20 1505)     LOS: 1 day     Hollice Espy, MD [facility]   11/25/2020, 3:06 PM

## 2020-11-25 NOTE — Progress Notes (Signed)
Pharmacy Antibiotic Note  [name] is a 70 y.o. female admitted on 11/24/2020 with PNA.  Pharmacy has been consulted for Aztreonam and Vancomycin dosing.  Wt 58.4kg Estimated CrCl ~52ml/min  Plan: Aztreonam 1gm IV q8h Vancomycin 1250mg  IV now then 1000mg  IV Q 36 hrs. Goal AUC 400-550. Expected AUC: 522 SCr used: 1.61 Will f/u renal function, micro data, and pt's clinical condition Vanc levels prn      Temp (24hrs), Avg:96.1 F (35.6 C), Min:93.4 F (34.1 C), Max:98.7 F (37.1 C)  Recent Labs  Lab 11/24/20 1236 11/24/20 1242 11/24/20 1515 11/24/20 1633 11/24/20 2029 11/24/20 2306  WBC 12.4*  --   --   --   --   --   CREATININE  --  1.60* 2.01* 1.95* 1.76* 1.61*    CrCl cannot be calculated (Unknown ideal weight.).    Allergies  Allergen Reactions  . Penicillins Rash    Did it involve swelling of the face/tongue/throat, SOB, or low BP? Unknown Did it involve sudden or severe rash/hives, skin peeling, or any reaction on the inside of your mouth or nose? Unknown Did you need to seek medical attention at a hospital or doctor's office? Unknown When did it last happen? If all above answers are "NO", may proceed with cephalosporin use.     Antimicrobials this admission: 1/7 Vanc >>  1/7 Aztreonam >>   Microbiology results:  BCx:  1/6 MRSA PCR:   Thank you for allowing pharmacy to be a part of this patient's care.  3/7, PharmD, BCPS Please see amion for complete clinical pharmacist phone list 11/25/2020 1:12 AM

## 2020-11-25 NOTE — Progress Notes (Signed)
[facility]  CXR with CVL in appropriate place. Looks like HR is improved to at this time. Radiology questions small apical pneumothorax but I just pulled it back so unlikely. Can also we get a repeat in 24 hrs to verify nothing changing or if changes clinically.    I think it highly unlikely that there is a pneumothorax.  Updated Team.   Algis Greenhouse, MD [facility] [address] [place], [place] [address] [phone] (office)

## 2020-11-26 ENCOUNTER — Inpatient Hospital Stay (HOSPITAL_COMMUNITY): Payer: Medicare Other

## 2020-11-26 DIAGNOSIS — I5021 Acute systolic (congestive) heart failure: Secondary | ICD-10-CM | POA: Diagnosis not present

## 2020-11-26 DIAGNOSIS — I351 Nonrheumatic aortic (valve) insufficiency: Secondary | ICD-10-CM | POA: Diagnosis not present

## 2020-11-26 DIAGNOSIS — E1111 Type 2 diabetes mellitus with ketoacidosis with coma: Secondary | ICD-10-CM | POA: Diagnosis not present

## 2020-11-26 LAB — BASIC METABOLIC PANEL
Anion gap: 11 (ref 5–15)
BUN: 39 mg/dL — ABNORMAL HIGH (ref 8–23)
CO2: 18 mmol/L — ABNORMAL LOW (ref 22–32)
Calcium: 7.7 mg/dL — ABNORMAL LOW (ref 8.9–10.3)
Chloride: 115 mmol/L — ABNORMAL HIGH (ref 98–111)
Creatinine, Ser: 1.07 mg/dL — ABNORMAL HIGH (ref 0.44–1.00)
GFR, Estimated: 57 mL/min — ABNORMAL LOW (ref >60)
Glucose, Bld: 207 mg/dL — ABNORMAL HIGH (ref 70–99)
Potassium: 3.3 mmol/L — ABNORMAL LOW (ref 3.5–5.1)
Sodium: 144 mmol/L (ref 135–145)

## 2020-11-26 LAB — GLUCOSE, CAPILLARY
Glucose-Capillary: 172 mg/dL — ABNORMAL HIGH (ref 70–99)
Glucose-Capillary: 160 mg/dL — ABNORMAL HIGH (ref 70–99)
Glucose-Capillary: 154 mg/dL — ABNORMAL HIGH (ref 70–99)
Glucose-Capillary: 243 mg/dL — ABNORMAL HIGH (ref 70–99)
Glucose-Capillary: 282 mg/dL — ABNORMAL HIGH (ref 70–99)
Glucose-Capillary: 225 mg/dL — ABNORMAL HIGH (ref 70–99)

## 2020-11-26 LAB — ECHOCARDIOGRAM COMPLETE
Area-P 1/2: 3.3 cm2
P 1/2 time: 347 msec
S' Lateral: 2.22 cm
Weight: 2253.98 oz

## 2020-11-26 LAB — CBC
HCT: 33.2 % — ABNORMAL LOW (ref 36.0–46.0)
Hemoglobin: 11.1 g/dL — ABNORMAL LOW (ref 12.0–15.0)
MCH: 27.7 pg (ref 26.0–34.0)
MCHC: 33.4 g/dL (ref 30.0–36.0)
MCV: 82.8 fL (ref 80.0–100.0)
Platelets: 93 10*3/uL — ABNORMAL LOW (ref 150–400)
RBC: 4.01 MIL/uL (ref 3.87–5.11)
RDW: 15.3 % (ref 11.5–15.5)
WBC: 7.6 10*3/uL (ref 4.0–10.5)
nRBC: 0 % (ref 0.0–0.2)

## 2020-11-26 LAB — PROCALCITONIN: Procalcitonin: 3.28 ng/mL

## 2020-11-26 LAB — LACTIC ACID, PLASMA: Lactic Acid, Venous: 1.4 mmol/L (ref 0.5–1.9)

## 2020-11-26 LAB — MAGNESIUM: Magnesium: 1.3 mg/dL — ABNORMAL LOW (ref 1.7–2.4)

## 2020-11-26 LAB — PHOSPHORUS: Phosphorus: 2.6 mg/dL (ref 2.5–4.6)

## 2020-11-26 MED ORDER — POTASSIUM CHLORIDE 10 MEQ/100ML IV SOLN
10.0000 meq | INTRAVENOUS | Status: AC
  Administered 2020-11-26 (×3): 10 meq via INTRAVENOUS
  Filled 2020-11-26 (×3): qty 100

## 2020-11-26 MED ORDER — MAGNESIUM SULFATE 4 GM/100ML IV SOLN
4.0000 g | Freq: Once | INTRAVENOUS | Status: AC
  Administered 2020-11-26: 4 g via INTRAVENOUS
  Filled 2020-11-26: qty 100

## 2020-11-26 NOTE — Progress Notes (Signed)
PROGRESS NOTE  [name]  DOB: 03-Jul-1951  PCP: Lindley Magnus, PA-C GGE:[id number]  DOA: 11/24/2020  LOS: 2 days   Chief Complaint  Patient presents with  . Respiratory Distress    Brief narrative: [name] is a 70 y.o. 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  Patient was brought to the ED on 1/6 by spouse for worsening lethargy over a period of 24 hours.  Per spouse, he noted that she might have ran out of insulin in her pump for the duration.  In the ED, patient was noted to be obtunded with labs suggesting severe DKA with pH of 6.8, potassium 4, glucose more than 700. EKG with peaked T waves.  Patient was started on IV insulin drip, IV fluid.  Chest x-ray showed developing infiltrates probably due to aspiration Patient was admitted to hospitalist service  On the evening of admission, patient went to SVT, given adenosine to convert back to normal sinus rhythm.  She was also noted to be in septic shock and hence central line placement was obtained by general surgery.   Critical care consultation was obtained.   Over the course of hospitalization, she was weaned off insulin drip, remains on phenylephrine.  Mental status still minimally responsive.  Subjective: Patient was seen and examined this morning.  Per RN, patient was able to respond to her earlier and wanted to eat.  At the time of my evaluation, patient will try to open eyes on touch stimulus but fell back to sleep quickly. Remains on phenylephrine drip.  Not on supplemental oxygen.  Assessment/Plan: Diabetic ketoacidosis -Initial pH of 6.8 and glucose more than 700 -Managed per DKA protocol with IV fluid, IV insulin drip. -Currently on subcutaneous insulin.  Poorly controlled type 2 diabetes mellitus -A1c 13.3 on 1/6.  Apparently it  is an improvement compared to an A1c of 15 few months ago. -On insulin pump at home. -Currently on subcutaneous insulin with Lantus 16 units at bedtime and sliding scale insulin. -Need to adjust once mental status and oral intake improves. Recent Labs  Lab 11/25/20 1617 11/25/20 2000 11/25/20 2356 11/26/20 0421 11/26/20 0736  GLUCAP 183* 173* 193* 172* 160*   Septic shock secondary aspiration pneumonia -On admission, patient was in severe sepsis which progressed to septic shock in few hours -Currently on IV cefepime and IV Flagyl -Remains on phenylephrine drip and IV fluid.  Reduce LR to 100 mL/h.  Continue to wean down phenylephrine drip. -Sepsis parameters as below with improving WBC count, lactic acid and procalcitonin level.  Continue to monitor. -Not requiring supplemental oxygen at this time. -Repeat chest x-ray tomorrow. Recent Labs  Lab 11/24/20 1236 11/25/20 0152 11/25/20 0437 11/25/20 0719 11/26/20 0500  WBC 12.4*  --  2.8* 3.6* 7.6  LATICACIDVEN  --  3.8* 2.2*  --  1.4  PROCALCITON  --  9.40  --   --  3.28   Acute metabolic encephalopathy History of anoxic brain injury related to cardiac arrest -Unclear baseline mental status.  Need to clarify with family -Patient remains obtunded this morning.  Continue to monitor.  AKI -Secondary to sepsis -Creatinine normal at baseline.  Trend as below, improving now. Recent Labs    12/06/19 0559 11/24/20 1242 11/24/20 1515 11/24/20 1633 11/24/20 2029 11/24/20 2306 11/25/20 0152 11/25/20 0437 11/25/20 0719 11/26/20 0500  BUN 24* 41* 40*  40* 40* 41* 39* 41* 40* 39*  CREATININE 0.65 1.60* 2.01* 1.95* 1.76* 1.61* 1.49* 1.35* 1.38* 1.07*   Chronic systolic CHF (congestive heart failure) -EF of 35 to 40% from 2018.  Currently requiring aggressive IV resuscitation.  Repeat echocardiogram this admission.   SVT (supraventricular tachycardia)  -Probably related to sepsis.  Resolved with adenosine.  Currently in sinus  rhythm.    Hypokalemia/hypomagnesemia -Labs this morning with potassium low at 3.3 and magnesium low at 1.3.  Replacement ordered. Recent Labs  Lab 11/24/20 1633 11/24/20 2029 11/24/20 2306 11/25/20 0152 11/25/20 0437 11/25/20 0719 11/26/20 0500  K 4.5   < > 3.0* 3.3* 3.4* 3.5 3.3*  MG 2.4  --   --   --   --   --  1.3*  PHOS 5.2*  --   --   --   --   --  2.6   < > = values in this interval not displayed.   Mobility: Encourage ambulation.  Unclear baseline physical status.  PT eval after mental status improves Code Status:   Code Status: Full Code  Nutritional status: Body mass index is 22.74 kg/m.     Diet Order            Diet Carb Modified Fluid consistency: Thin; Room service appropriate? Yes  Diet effective now                 DVT prophylaxis: heparin injection 5,000 Units Start: 11/24/20 1800 SCDs Start: 11/24/20 1525   Antimicrobials:  IV cefepime and IV Flagyl Fluid: LR at 100 mL/h Consultants: Critical care Family Communication:  None at bedside  Status is: Inpatient  Remains inpatient appropriate because:Hemodynamically unstable   Dispo: The patient is from: Home              Anticipated d/c is to: Home likely              Anticipated d/c date is: > 3 days              Patient currently is not medically stable to d/c.       Infusions:  . ceFEPime (MAXIPIME) IV 2 g (11/26/20 1018)  . lactated ringers 100 mL/hr at 11/26/20 0856  . magnesium sulfate bolus IVPB    . metronidazole 500 mg (11/26/20 0229)  . norepinephrine (LEVOPHED) Adult infusion Stopped (11/25/20 1009)  . phenylephrine (NEO-SYNEPHRINE) Adult infusion 10 mcg/min (11/26/20 0858)  . potassium chloride      Scheduled Meds: . Chlorhexidine Gluconate Cloth  6 each Topical Daily  . heparin  5,000 Units Subcutaneous Q8H  . hydrocortisone sod succinate (SOLU-CORTEF) inj  50 mg Intravenous Q6H  . insulin aspart  0-9 Units Subcutaneous Q4H  . insulin glargine  16 Units Subcutaneous  QHS    Antimicrobials: Anti-infectives (From admission, onward)   Start     Dose/Rate Route Frequency Ordered Stop   11/26/20 1200  vancomycin (VANCOCIN) IVPB 1000 mg/200 mL premix  Status:  Discontinued        1,000 mg 200 mL/hr over 60 Minutes Intravenous Every 36 hours 11/25/20 0128 11/25/20 1427   11/25/20 1130  ceFEPIme (MAXIPIME) 2 g in sodium chloride 0.9 % 100 mL IVPB        2 g 200 mL/hr over 30 Minutes Intravenous Every 12 hours 11/25/20 1034     11/25/20 0600  aztreonam (AZACTAM) 1 g in sodium chloride 0.9 % 100 mL IVPB  Status:  Discontinued  1 g 200 mL/hr over 30 Minutes Intravenous Every 8 hours 11/25/20 0128 11/25/20 1031   11/25/20 0300  metroNIDAZOLE (FLAGYL) IVPB 500 mg        500 mg 100 mL/hr over 60 Minutes Intravenous Every 8 hours 11/25/20 0158     11/25/20 0200  vancomycin (VANCOCIN) IVPB 1000 mg/200 mL premix  Status:  Discontinued        1,000 mg 200 mL/hr over 60 Minutes Intravenous  Once 11/25/20 0111 11/25/20 0114   11/25/20 0200  vancomycin (VANCOREADY) IVPB 1250 mg/250 mL        1,250 mg 166.7 mL/hr over 90 Minutes Intravenous  Once 11/25/20 0114 11/25/20 1630   11/24/20 1400  levofloxacin (LEVAQUIN) IVPB 750 mg        750 mg 100 mL/hr over 90 Minutes Intravenous  Once 11/24/20 1357 11/24/20 1659      PRN meds: dextrose, ondansetron (ZOFRAN) IV   Objective: Vitals:   11/26/20 0900 11/26/20 1000  BP: (!) 106/53 (!) 124/57  Pulse: 78 88  Resp: 15 (!) 21  Temp:    SpO2: 98% 93%    Intake/Output Summary (Last 24 hours) at 11/26/2020 1025 Last data filed at 11/26/2020 0646 Gross per 24 hour  Intake 3038.69 ml  Output 860 ml  Net 2178.69 ml   Filed Weights   11/26/20 0400  Weight: 63.9 kg   Weight change:  Body mass index is 22.74 kg/m.   Physical Exam: General exam: Elderly Caucasian female.  Obtunded.  Not in physical distress Skin: No rashes, lesions or ulcers. HEENT: Atraumatic, normocephalic, no obvious bleeding Lungs: Clear  to auscultation bilaterally CVS: Regular rate and rhythm, no murmur GI/Abd soft, nontender, nondistended, bowel sound present CNS: Tries to open eyes on touch, not restless or agitated Psychiatry: Unable to examine because of altered mental status Extremities: No pedal edema, no calf tenderness  Data Review: I have personally reviewed the laboratory data and studies available.  Recent Labs  Lab 11/24/20 1236 11/24/20 1242 11/25/20 0437 11/25/20 0719 11/26/20 0500  WBC 12.4*  --  2.8* 3.6* 7.6  NEUTROABS 9.4*  --  2.0  --   --   HGB 16.4* 18.7* 12.9 12.2 11.1*  HCT 54.9* 55.0* 37.3 35.3* 33.2*  MCV 92.7  --  80.2 80.0 82.8  PLT 299  --  168 132* 93*   Recent Labs  Lab 11/24/20 1633 11/24/20 2029 11/24/20 2306 11/25/20 0152 11/25/20 0437 11/25/20 0719 11/26/20 0500  NA 141   < > 140 142 142 141 144  K 4.5   < > 3.0* 3.3* 3.4* 3.5 3.3*  CL 107   < > 105 106 109 109 115*  CO2 <7*   < > 17* 18* 22 22 18*  GLUCOSE 615*   < > 311* 279* 248* 214* 207*  BUN 40*   < > 41* 39* 41* 40* 39*  CREATININE 1.95*   < > 1.61* 1.49* 1.35* 1.38* 1.07*  CALCIUM 8.5*   < > 8.4* 8.5* 8.0* 7.9* 7.7*  MG 2.4  --   --   --   --   --  1.3*  PHOS 5.2*  --   --   --   --   --  2.6   < > = values in this interval not displayed.    F/u labs ordered  Signed, Lorin Glass, MD [facility] 11/26/2020

## 2020-11-26 NOTE — Plan of Care (Signed)
  Problem: Clinical Measurements: Goal: Ability to maintain clinical measurements within normal limits will improve Outcome: Progressing   Problem: Clinical Measurements: Goal: Respiratory complications will improve Outcome: Progressing   Problem: Clinical Measurements: Goal: Cardiovascular complication will be avoided Outcome: Progressing   Problem: Safety: Goal: Ability to remain free from injury will improve Outcome: Progressing   Problem: Respiratory: Goal: Will regain and/or maintain adequate ventilation Outcome: Progressing

## 2020-11-26 NOTE — Progress Notes (Signed)
*  PRELIMINARY RESULTS* Echocardiogram 2D Echocardiogram has been performed.  [name] 11/26/2020, 10:13 AM

## 2020-11-27 ENCOUNTER — Inpatient Hospital Stay (HOSPITAL_COMMUNITY): Payer: Medicare Other

## 2020-11-27 DIAGNOSIS — N179 Acute kidney failure, unspecified: Secondary | ICD-10-CM | POA: Diagnosis not present

## 2020-11-27 DIAGNOSIS — I5022 Chronic systolic (congestive) heart failure: Secondary | ICD-10-CM | POA: Diagnosis not present

## 2020-11-27 DIAGNOSIS — G9341 Metabolic encephalopathy: Secondary | ICD-10-CM | POA: Diagnosis not present

## 2020-11-27 DIAGNOSIS — E860 Dehydration: Secondary | ICD-10-CM | POA: Diagnosis not present

## 2020-11-27 DIAGNOSIS — I471 Supraventricular tachycardia: Secondary | ICD-10-CM

## 2020-11-27 LAB — BASIC METABOLIC PANEL
Anion gap: 8 (ref 5–15)
BUN: 32 mg/dL — ABNORMAL HIGH (ref 8–23)
CO2: 20 mmol/L — ABNORMAL LOW (ref 22–32)
Calcium: 7.9 mg/dL — ABNORMAL LOW (ref 8.9–10.3)
Chloride: 110 mmol/L (ref 98–111)
Creatinine, Ser: 0.85 mg/dL (ref 0.44–1.00)
GFR, Estimated: >60 mL/min (ref >60)
Glucose, Bld: 255 mg/dL — ABNORMAL HIGH (ref 70–99)
Potassium: 2.9 mmol/L — ABNORMAL LOW (ref 3.5–5.1)
Sodium: 138 mmol/L (ref 135–145)

## 2020-11-27 LAB — CBC
HCT: 32.6 % — ABNORMAL LOW (ref 36.0–46.0)
Hemoglobin: 10.8 g/dL — ABNORMAL LOW (ref 12.0–15.0)
MCH: 27.2 pg (ref 26.0–34.0)
MCHC: 33.1 g/dL (ref 30.0–36.0)
MCV: 82.1 fL (ref 80.0–100.0)
Platelets: 90 10*3/uL — ABNORMAL LOW (ref 150–400)
RBC: 3.97 MIL/uL (ref 3.87–5.11)
RDW: 15.7 % — ABNORMAL HIGH (ref 11.5–15.5)
WBC: 8.3 10*3/uL (ref 4.0–10.5)
nRBC: 0 % (ref 0.0–0.2)

## 2020-11-27 LAB — CULTURE, RESPIRATORY W GRAM STAIN
Culture: NORMAL
Special Requests: NORMAL

## 2020-11-27 LAB — MAGNESIUM: Magnesium: 2 mg/dL (ref 1.7–2.4)

## 2020-11-27 LAB — GLUCOSE, CAPILLARY
Glucose-Capillary: 268 mg/dL — ABNORMAL HIGH (ref 70–99)
Glucose-Capillary: 227 mg/dL — ABNORMAL HIGH (ref 70–99)
Glucose-Capillary: 297 mg/dL — ABNORMAL HIGH (ref 70–99)
Glucose-Capillary: 292 mg/dL — ABNORMAL HIGH (ref 70–99)
Glucose-Capillary: 329 mg/dL — ABNORMAL HIGH (ref 70–99)
Glucose-Capillary: 286 mg/dL — ABNORMAL HIGH (ref 70–99)

## 2020-11-27 LAB — PHOSPHORUS: Phosphorus: 1.9 mg/dL — ABNORMAL LOW (ref 2.5–4.6)

## 2020-11-27 NOTE — Progress Notes (Deleted)
CRITICAL VALUE ALERT  Critical Value:  Gram negative rods anaerobic bottles 1st set of cultures    Date & Time Notied:  11/27/20 2045  Provider Notified: Dr. Wendall Stade   Orders Received/Actions taken: see notes

## 2020-11-27 NOTE — Progress Notes (Signed)
PROGRESS NOTE  [name]  DOB: Feb 04, 1951  PCP: Lindley Magnus, PA-C ZOX:[id number]  DOA: 11/24/2020  LOS: 3 days   Chief Complaint  Patient presents with  . Respiratory Distress    Brief narrative: [name] is a 70 y.o. 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  Patient was brought to the ED on 1/6 by spouse for worsening lethargy over a period of 24 hours.  Per spouse, he noted that she might have ran out of insulin in her pump for the duration.  In the ED, patient was noted to be obtunded with labs suggesting severe DKA with pH of 6.8, potassium 4, glucose more than 700. EKG with peaked T waves.  Patient was started on IV insulin drip, IV fluid.  Chest x-ray showed developing infiltrates probably due to aspiration Patient was admitted to hospitalist service  On the evening of admission, patient went to SVT, given adenosine to convert back to normal sinus rhythm.  She was also noted to be in septic shock and hence central line placement was obtained by general surgery.   Critical care consultation was obtained.   Over the course of hospitalization, she was weaned off insulin drip, remains on phenylephrine.  Mental status still minimally responsive.  Subjective: No requiring oxygen supplementation; oriented x2.  Tolerating diet without problem.  Off pressors currently.  If remains stable will transfer to telemetry at 7 a.m.  Request physical therapy evaluation  Assessment/Plan: Diabetic ketoacidosis -Initial pH of 6.8 and glucose more than 700 -Managed per DKA protocol with IV fluid, IV insulin drip. -Currently on subcutaneous insulin. -Continue to follow CBGs.  Poorly controlled type 2 diabetes mellitus -A1c 13.3 on 1/6.  Apparently it is an improvement compared to an A1c of 15 few months  ago. -On insulin pump at home. -Currently on subcutaneous insulin with Lantus 16 units at bedtime and sliding scale insulin. -Need to adjust once mental status and oral intake improves.  Recent Labs  Lab 11/26/20 2323 11/27/20 0347 11/27/20 0711 11/27/20 1132 11/27/20 1551  GLUCAP 225* 268* 227* 297* 292*   Septic shock secondary aspiration pneumonia -On admission, patient was in severe sepsis which progressed to septic shock in few hours -Currently on IV cefepime and IV Flagyl -Remains on phenylephrine drip and IV fluid.   -Continue Solu-Cortef -Sepsis parameters as below with improving WBC count, lactic acid and procalcitonin level.  Continue to monitor. -Not requiring supplemental oxygen at this time. -Repeat chest x-ray demonstrating no pneumothorax or significant abnormalities for concerns.  Recent Labs  Lab 11/24/20 1236 11/25/20 0152 11/25/20 0437 11/25/20 0719 11/26/20 0500 11/27/20 0629  WBC 12.4*  --  2.8* 3.6* 7.6 8.3  LATICACIDVEN  --  3.8* 2.2*  --  1.4  --   PROCALCITON  --  9.40  --   --  3.28  --    Acute metabolic encephalopathy -History of anoxic brain injury related to cardiac arrest -Unclear baseline mental status.  -Oriented times 2 and following commands appropriately today.  AKI -Secondary to sepsis -Creatinine normal and back to baseline currently.   Recent Labs    11/24/20 1242 11/24/20 1515 11/24/20 1633 11/24/20 2029 11/24/20 2306 11/25/20 0152 11/25/20 0437 11/25/20 0719 11/26/20 0500 11/27/20 0629  BUN 41* 40* 40* 40* 41* 39* 41* 40* 39* 32*  CREATININE 1.60* 2.01* 1.95* 1.76* 1.61* 1.49* 1.35*  1.38* 1.07* 0.85   Chronic systolic CHF (congestive heart failure) -EF of 35 to 40% from 2018.  -Final results of echo -Volume status stable overall -Follow daily weights and strict intake and output; low-sodium diet encouraged.  SVT (supraventricular tachycardia)  -Probably related to sepsis.   -Resolved with adenosine.    -Currently in sinus rhythm and stable.   -Continue telemetry monitoring and electrolyte stability.  Hypokalemia/hypomagnesemia -Labs this morning with potassium low at 3.5  -Continue to follow electrolytes trend and further replete as needed.  Mobility: Encourage ambulation.  Unclear baseline physical status.  PT eval ordered. Code Status:   Code Status: Full Code  Nutritional status: Body mass index is 26.19 kg/m.     Diet Order            Diet Carb Modified Fluid consistency: Thin; Room service appropriate? Yes  Diet effective now                 DVT prophylaxis: SCDs Start: 11/24/20 1525   Antimicrobials:  IV cefepime and IV Flagyl Fluid: LR at 100 mL/h Consultants: Critical care Family Communication:  None at bedside  Status is: Inpatient  Remains inpatient appropriate because:Hemodynamically unstable   Dispo: The patient is from: Home              Anticipated d/c is to: Home likely              Anticipated d/c date is: > 3 days              Patient currently is not medically stable to d/c.       Infusions:  . ceFEPime (MAXIPIME) IV Stopped (11/27/20 1054)  . metronidazole Stopped (11/27/20 1115)    Scheduled Meds: . Chlorhexidine Gluconate Cloth  6 each Topical Daily  . hydrocortisone sod succinate (SOLU-CORTEF) inj  50 mg Intravenous Q6H  . insulin aspart  0-9 Units Subcutaneous Q4H  . insulin glargine  16 Units Subcutaneous QHS    Antimicrobials: Anti-infectives (From admission, onward)   Start     Dose/Rate Route Frequency Ordered Stop   11/26/20 1200  vancomycin (VANCOCIN) IVPB 1000 mg/200 mL premix  Status:  Discontinued        1,000 mg 200 mL/hr over 60 Minutes Intravenous Every 36 hours 11/25/20 0128 11/25/20 1427   11/25/20 1130  ceFEPIme (MAXIPIME) 2 g in sodium chloride 0.9 % 100 mL IVPB        2 g 200 mL/hr over 30 Minutes Intravenous Every 12 hours 11/25/20 1034     11/25/20 0600  aztreonam (AZACTAM) 1 g in sodium chloride 0.9 % 100  mL IVPB  Status:  Discontinued        1 g 200 mL/hr over 30 Minutes Intravenous Every 8 hours 11/25/20 0128 11/25/20 1031   11/25/20 0300  metroNIDAZOLE (FLAGYL) IVPB 500 mg        500 mg 100 mL/hr over 60 Minutes Intravenous Every 8 hours 11/25/20 0158     11/25/20 0200  vancomycin (VANCOCIN) IVPB 1000 mg/200 mL premix  Status:  Discontinued        1,000 mg 200 mL/hr over 60 Minutes Intravenous  Once 11/25/20 0111 11/25/20 0114   11/25/20 0200  vancomycin (VANCOREADY) IVPB 1250 mg/250 mL        1,250 mg 166.7 mL/hr over 90 Minutes Intravenous  Once 11/25/20 0114 11/25/20 1630   11/24/20 1400  levofloxacin (LEVAQUIN) IVPB 750 mg        750 mg  100 mL/hr over 90 Minutes Intravenous  Once 11/24/20 1357 11/24/20 1659      PRN meds: dextrose, ondansetron (ZOFRAN) IV   Objective: Vitals:   11/27/20 1553 11/27/20 1600  BP:  (!) 117/56  Pulse:  80  Resp:  11  Temp: 98.2 F (36.8 C)   SpO2:  100%    Intake/Output Summary (Last 24 hours) at 11/27/2020 1731 Last data filed at 11/27/2020 1548 Gross per 24 hour  Intake 2427.07 ml  Output 2225 ml  Net 202.07 ml   Filed Weights   11/26/20 0400 11/27/20 0515  Weight: 63.9 kg 69.2 kg   Weight change: 5.3 kg Body mass index is 26.19 kg/m.   Physical Exam: General exam: Alert, awake, oriented x 2; following commands appropriately and currently denying any nausea vomiting.  Tolerating diet continued patient is afebrile Respiratory system: Clear to auscultation. Respiratory effort normal.  No requiring oxygen supplementation continued Cardiovascular system:RRR. No murmurs, rubs, gallops.  No JVD Gastrointestinal system: Abdomen is nondistended, soft and nontender. No organomegaly or masses felt. Normal bowel sounds heard. Central nervous system: Alert and oriented. No focal neurological deficits. Extremities: No cyanosis or clubbing.  No edema appreciated. Skin: No petechiae. Psychiatry: Mood & affect appropriate.    Data Review: I  have personally reviewed the laboratory data and studies available.  Recent Labs  Lab 11/24/20 1236 11/24/20 1242 11/25/20 0437 11/25/20 0719 11/26/20 0500 11/27/20 0629  WBC 12.4*  --  2.8* 3.6* 7.6 8.3  NEUTROABS 9.4*  --  2.0  --   --   --   HGB 16.4* 18.7* 12.9 12.2 11.1* 10.8*  HCT 54.9* 55.0* 37.3 35.3* 33.2* 32.6*  MCV 92.7  --  80.2 80.0 82.8 82.1  PLT 299  --  168 132* 93* 90*   Recent Labs  Lab 11/24/20 1633 11/24/20 2029 11/25/20 0152 11/25/20 0437 11/25/20 0719 11/26/20 0500 11/27/20 0629  NA 141   < > 142 142 141 144 138  K 4.5   < > 3.3* 3.4* 3.5 3.3* 2.9*  CL 107   < > 106 109 109 115* 110  CO2 <7*   < > 18* 22 22 18* 20*  GLUCOSE 615*   < > 279* 248* 214* 207* 255*  BUN 40*   < > 39* 41* 40* 39* 32*  CREATININE 1.95*   < > 1.49* 1.35* 1.38* 1.07* 0.85  CALCIUM 8.5*   < > 8.5* 8.0* 7.9* 7.7* 7.9*  MG 2.4  --   --   --   --  1.3* 2.0  PHOS 5.2*  --   --   --   --  2.6 1.9*   < > = values in this interval not displayed.    F/u labs ordered  Signed, Vassie Loll, MD [facility] 11/27/2020

## 2020-11-27 NOTE — Progress Notes (Signed)
11/28/19  New CXR obtained this morning.  I have personally viewed the image.  There is no pneumothorax as suspected on CXR from 11/26/19.  Please let us know if any questions or concerns.  Henrene Dodge, MD [facility]

## 2020-11-28 DIAGNOSIS — G9341 Metabolic encephalopathy: Secondary | ICD-10-CM | POA: Diagnosis not present

## 2020-11-28 DIAGNOSIS — E860 Dehydration: Secondary | ICD-10-CM | POA: Diagnosis not present

## 2020-11-28 DIAGNOSIS — N179 Acute kidney failure, unspecified: Secondary | ICD-10-CM | POA: Diagnosis not present

## 2020-11-28 DIAGNOSIS — I5022 Chronic systolic (congestive) heart failure: Secondary | ICD-10-CM | POA: Diagnosis not present

## 2020-11-28 LAB — BASIC METABOLIC PANEL
Anion gap: 9 (ref 5–15)
BUN: 28 mg/dL — ABNORMAL HIGH (ref 8–23)
CO2: 22 mmol/L (ref 22–32)
Calcium: 8.1 mg/dL — ABNORMAL LOW (ref 8.9–10.3)
Chloride: 108 mmol/L (ref 98–111)
Creatinine, Ser: 0.77 mg/dL (ref 0.44–1.00)
GFR, Estimated: >60 mL/min (ref >60)
Glucose, Bld: 298 mg/dL — ABNORMAL HIGH (ref 70–99)
Potassium: 3 mmol/L — ABNORMAL LOW (ref 3.5–5.1)
Sodium: 139 mmol/L (ref 135–145)

## 2020-11-28 LAB — MAGNESIUM: Magnesium: 2.1 mg/dL (ref 1.7–2.4)

## 2020-11-28 LAB — GLUCOSE, CAPILLARY
Glucose-Capillary: 325 mg/dL — ABNORMAL HIGH (ref 70–99)
Glucose-Capillary: 241 mg/dL — ABNORMAL HIGH (ref 70–99)
Glucose-Capillary: 292 mg/dL — ABNORMAL HIGH (ref 70–99)
Glucose-Capillary: 365 mg/dL — ABNORMAL HIGH (ref 70–99)
Glucose-Capillary: 416 mg/dL — ABNORMAL HIGH (ref 70–99)

## 2020-11-28 LAB — CBC
HCT: 36.2 % (ref 36.0–46.0)
Hemoglobin: 11.7 g/dL — ABNORMAL LOW (ref 12.0–15.0)
MCH: 27 pg (ref 26.0–34.0)
MCHC: 32.3 g/dL (ref 30.0–36.0)
MCV: 83.6 fL (ref 80.0–100.0)
Platelets: 102 10*3/uL — ABNORMAL LOW (ref 150–400)
RBC: 4.33 MIL/uL (ref 3.87–5.11)
RDW: 15.2 % (ref 11.5–15.5)
WBC: 7.5 10*3/uL (ref 4.0–10.5)
nRBC: 0 % (ref 0.0–0.2)

## 2020-11-28 MED ORDER — INSULIN GLARGINE 100 UNIT/ML ~~LOC~~ SOLN
20.0000 [IU] | Freq: Every day | SUBCUTANEOUS | Status: DC
  Administered 2020-11-28 – 2020-11-29 (×2): 20 [IU] via SUBCUTANEOUS
  Filled 2020-11-28 (×3): qty 0.2

## 2020-11-28 MED ORDER — ACETAMINOPHEN 325 MG PO TABS
650.0000 mg | ORAL_TABLET | Freq: Four times a day (QID) | ORAL | Status: DC | PRN
  Administered 2020-11-28 – 2020-11-29 (×4): 650 mg via ORAL
  Filled 2020-11-28 (×4): qty 2

## 2020-11-28 MED ORDER — POTASSIUM CHLORIDE CRYS ER 20 MEQ PO TBCR
40.0000 meq | EXTENDED_RELEASE_TABLET | Freq: Once | ORAL | Status: AC
  Administered 2020-11-28: 40 meq via ORAL
  Filled 2020-11-28: qty 2

## 2020-11-28 MED ORDER — GUAIFENESIN-DM 100-10 MG/5ML PO SYRP
5.0000 mL | ORAL_SOLUTION | ORAL | Status: DC | PRN
  Administered 2020-11-28: 5 mL via ORAL
  Filled 2020-11-28: qty 5

## 2020-11-28 MED ORDER — HYDROCORTISONE NA SUCCINATE PF 100 MG IJ SOLR
50.0000 mg | Freq: Two times a day (BID) | INTRAMUSCULAR | Status: DC
  Administered 2020-11-29: 50 mg via INTRAVENOUS
  Filled 2020-11-28: qty 2

## 2020-11-28 MED ORDER — CEFDINIR 300 MG PO CAPS
300.0000 mg | ORAL_CAPSULE | Freq: Two times a day (BID) | ORAL | Status: DC
Start: 2020-11-28 — End: 2020-11-30
  Administered 2020-11-28 – 2020-11-30 (×5): 300 mg via ORAL
  Filled 2020-11-28 (×6): qty 1

## 2020-11-28 MED ORDER — METRONIDAZOLE 500 MG PO TABS
500.0000 mg | ORAL_TABLET | Freq: Three times a day (TID) | ORAL | Status: DC
  Administered 2020-11-28 – 2020-11-30 (×6): 500 mg via ORAL
  Filled 2020-11-28 (×6): qty 1

## 2020-11-28 NOTE — Progress Notes (Signed)
Inpatient Diabetes Program Recommendations  AACE/ADA: New Consensus Statement on Inpatient Glycemic Control (2015)  Target Ranges:  Prepandial:   less than 140 mg/dL      Peak postprandial:   less than 180 mg/dL (1-2 hours)      Critically ill patients:  140 - 180 mg/dL   Results for LUCIA, HARM A (MRN [id number]) as of 11/28/2020 06:57  Ref. Range 11/26/2020 23:23 11/27/2020 03:47 11/27/2020 07:11 11/27/2020 11:32 11/27/2020 15:51 11/27/2020 19:37  Glucose-Capillary Latest Ref Range: 70 - 99 mg/dL 144 (H)  3 units NOVOLOG  268 (H)  5 units NOVOLOG  227 (H)  3 units NOVOLOG  297 (H)  5 units NOVOLOG  292 (H)  5 units NOVOLOG  329 (H)  7 units NOVOLOG  16 units LANTUS @10 :35pm   Results for SONYIA, MURO (MRN [name]) as of 11/28/2020 06:57  Ref. Range 11/27/2020 23:06 11/28/2020 03:40  Glucose-Capillary Latest Ref Range: 70 - 99 mg/dL 01/26/2021 (H)  5 units NOVOLOG  325 (H)  7 units NOVOLOG     In reviewing the chart noted patient has had 5 hospital admissions with [facility] system in 2021 for DKA.  Noted office visit with Dr. 2022 on 09/20/20 which notes due to multiple hospital admissions with DKA, not checking glucose, missing insulin dosages, and running out of insulin patient was asked not to use V-Go insulin pump but was prescribed Tresiba 18 units daily and Novolog 6 units with meals and 2 units with snacks.  Patient was to meet with a V-Go trainer to determine if resuming V-Go was appropriate for patient. Per most recent hospital admission 10/27/20-10/30/20 at [facility] it was noted that patient has cognitive decline, patient not checking glucose, and not taking boluses with pump.  A1C was 15.1% on 10/27/20 in Care Everywhere and currently 13.3% on 11/24/2020. Per H&P on 11/24/20, patient's significant other went to fill insulin pump and found it empty (but reports there was some insulin in it yesterday). The V-Go insulin pump is a disposable insulin pump used for 24 hours  and has to be changed out daily. The V-Go 20 delivers 20 units of basal insulin over 24 hour period and per chart patient was suppose to take 3 clicks per meal (each click is 2 units of insulin). Given multiple hospital admissions for DKA, patient's noncompliance with glucose monitoring and bolusing for meals, A1C, and noted cognitive decline, would not recommend patient resume her insulin pump (also recommendation of her Endocrinologist Dr. 01/22/21 per last office visit note on 09/20/2020).   Home DM Meds: V-Go 20 insulin pump with Novolog (provides a total of 20 units of basal per 24 hours), 3 clicks with meals (each click is 2 units of insulin)       Tresiba 18 units daily  Current Orders: Lantus 16 units QHS      Novolog Sensitive Correction Scale/ SSI (0-9 units) Q4 hours     MD- Note patient getting Solucortef 50 mg Q6 hours.  CBGs consistently >200.  Please consider the following:  1. Increase the Lantus to 20 units QHS  2. Change the Novolog SSi to TID ac + hs  3. Start Novolog Meal Coverage: Novolog 6 units TID with meals  (Please add the following Hold Parameters: Hold if pt eats <50% of meal, Hold if pt NPO)     --Will follow patient during hospitalization--  13/12/2019 RN, MSN, CDE Diabetes Coordinator Inpatient Glycemic Control Team Team Pager: [phone] (8a-5p)

## 2020-11-28 NOTE — Plan of Care (Signed)
  Problem: Acute Rehab PT Goals(only PT should resolve) Goal: Pt Will Go Supine/Side To Sit Outcome: Progressing Flowsheets (Taken 11/28/2020 1618) Pt will go Supine/Side to Sit:  Independently  with modified independence Goal: Patient Will Transfer Sit To/From Stand Outcome: Progressing Flowsheets (Taken 11/28/2020 1618) Patient will transfer sit to/from stand:  with modified independence  Independently Goal: Pt Will Transfer Bed To Chair/Chair To Bed Outcome: Progressing Flowsheets (Taken 11/28/2020 1618) Pt will Transfer Bed to Chair/Chair to Bed:  Independently  with modified independence Goal: Pt Will Ambulate Outcome: Progressing Flowsheets (Taken 11/28/2020 1618) Pt will Ambulate:  > 125 feet  with modified independence   4:19 PM, 11/28/20 Ocie Bob, MPT Physical Therapist with [facility] [phone] office [phone] mobile phone

## 2020-11-28 NOTE — Progress Notes (Signed)
PROGRESS NOTE  [name]  DOB: [birth year]  PCP: Lindley Magnus, PA-C PJA:[id number]  DOA: 11/24/2020  LOS: 4 days   Chief Complaint  Patient presents with  . Respiratory Distress    Brief narrative: [name] is a 70 y.o. 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  Patient was brought to the ED on 1/6 by spouse for worsening lethargy over a period of 24 hours.  Per spouse, he noted that she might have ran out of insulin in her pump for the duration.  In the ED, patient was noted to be obtunded with labs suggesting severe DKA with pH of 6.8, potassium 4, glucose more than 700. EKG with peaked T waves.  Patient was started on IV insulin drip, IV fluid.  Chest x-ray showed developing infiltrates probably due to aspiration Patient was admitted to hospitalist service  On the evening of admission, patient went to SVT, given adenosine to convert back to normal sinus rhythm.  She was also noted to be in septic shock and hence central line placement was obtained by general surgery.   Critical care consultation was obtained.   Over the course of hospitalization, she was weaned off insulin drip, remains on phenylephrine.  Mental status still minimally responsive.  Subjective: Oriented x2; in no acute distress.  Denies chest pain, no nausea, no vomiting.  Tolerating diet and currently afebrile.  Assessment/Plan: Diabetic ketoacidosis -Initial pH of 6.8 and glucose more than 700 -Managed per DKA protocol with IV fluid, IV insulin drip. -Currently on subcutaneous insulin. -Continue to follow CBGs.  Poorly controlled type 2 diabetes mellitus -A1c 13.3 on 1/6.  Apparently it is an improvement compared to an A1c of 15 few months ago. -On insulin pump at home. -Currently on subcutaneous insulin with Lantus  adjusted to 20 units at bedtime and sliding scale insulin. -Continue modified carbohydrate diet -Further adjustment to hypoglycemic regimen to be done based on CBGs fluctuation. -Patient will be weaned off Solu-Cortef  Recent Labs  Lab 11/27/20 2306 11/28/20 0340 11/28/20 0729 11/28/20 1126 11/28/20 1600  GLUCAP 286* 325* 241* 292* 365*   Septic shock secondary aspiration pneumonia -On admission, patient was in severe sepsis which progressed to septic shock in few hours -Septic shock has resolved -Will continue weaning of Solu-Cortef -Transition antibiotics to oral regimen -Anticipate discharge 11/29/2020 -Sepsis parameters as below with improving WBC count, lactic acid and procalcitonin level.  Continue to monitor. -Not requiring supplemental oxygen at this time. -Repeat chest x-ray demonstrating no pneumothorax or significant abnormalities for concerns.  Recent Labs  Lab 11/25/20 0152 11/25/20 0437 11/25/20 0719 11/26/20 0500 11/27/20 0629 11/28/20 0445  WBC  --  2.8* 3.6* 7.6 8.3 7.5  LATICACIDVEN 3.8* 2.2*  --  1.4  --   --   PROCALCITON 9.40  --   --  3.28  --   --    Acute metabolic encephalopathy -History of anoxic brain injury related to cardiac arrest. -Unclear baseline mental status.  -Oriented times 2 and following commands appropriately today. -Physical therapy evaluation has found patient to be functioning near her baseline and recommended safe to discharge home without home health services or outpatient PT.  No equipment recommended.  AKI -Secondary to sepsis -Creatinine normal and back to baseline currently.   -Continue to maintain adequate hydration. Recent Labs    11/24/20 1515 11/24/20 1633 11/24/20  2029 11/24/20 2306 11/25/20 0152 11/25/20 0437 11/25/20 0719 11/26/20 0500 11/27/20 0629 11/28/20 0445  BUN 40* 40* 40* 41* 39* 41* 40* 39* 32* 28*  CREATININE 2.01* 1.95* 1.76* 1.61* 1.49* 1.35* 1.38* 1.07* 0.85 0.77   Chronic systolic CHF  (congestive heart failure) -EF of 35 to 40% from 2018.  -2D echo demonstrating ejection fraction 65 to 70% during this admission.  No regional wall motion abnormalities and only determine left ventricle diastolic parameters.  There was no significant valvular disorder appreciated. -Volume status stable overall. -Follow daily weights and strict intake and output; low-sodium diet encouraged.  SVT (supraventricular tachycardia)  -Probably related to sepsis.   -Resolved with adenosine x1 dose.   -Currently in sinus rhythm and stable.   -Continue telemetry monitoring and electrolyte stability.  Hypokalemia/hypomagnesemia -Labs this morning with potassium low at 3.0  -Continue to follow electrolytes trend and further replete as needed.  Mobility: Encourage ambulation.  Unclear baseline physical status.  PT eval ordered. Code Status:   Code Status: Full Code  Nutritional status: Body mass index is 27.21 kg/m.     Diet Order            Diet Carb Modified Fluid consistency: Thin; Room service appropriate? Yes  Diet effective now                 DVT prophylaxis: SCDs Start: 11/24/20 1525   Antimicrobials:  IV cefepime and IV Flagyl Fluid: LR at 100 mL/h Consultants: Critical care Family Communication:  None at bedside  Status is: Inpatient  Remains inpatient appropriate because:Hemodynamically unstable   Dispo: The patient is from: Home              Anticipated d/c is to: Home               Anticipated d/c date is: 1 day              Patient currently is not medically stable to d/c.       Infusions:    Scheduled Meds: . cefdinir  300 mg Oral Q12H  . Chlorhexidine Gluconate Cloth  6 each Topical Daily  . [START ON 11/29/2020] hydrocortisone sod succinate (SOLU-CORTEF) inj  50 mg Intravenous Q12H  . insulin aspart  0-9 Units Subcutaneous Q4H  . insulin glargine  20 Units Subcutaneous QHS  . metroNIDAZOLE  500 mg Oral Q8H    Antimicrobials: Anti-infectives (From  admission, onward)   Start     Dose/Rate Route Frequency Ordered Stop   11/28/20 1000  metroNIDAZOLE (FLAGYL) tablet 500 mg        500 mg Oral Every 8 hours 11/28/20 0811     11/28/20 1000  cefdinir (OMNICEF) capsule 300 mg        300 mg Oral Every 12 hours 11/28/20 0811     11/26/20 1200  vancomycin (VANCOCIN) IVPB 1000 mg/200 mL premix  Status:  Discontinued        1,000 mg 200 mL/hr over 60 Minutes Intravenous Every 36 hours 11/25/20 0128 11/25/20 1427   11/25/20 1130  ceFEPIme (MAXIPIME) 2 g in sodium chloride 0.9 % 100 mL IVPB  Status:  Discontinued        2 g 200 mL/hr over 30 Minutes Intravenous Every 12 hours 11/25/20 1034 11/28/20 0811   11/25/20 0600  aztreonam (AZACTAM) 1 g in sodium chloride 0.9 % 100 mL IVPB  Status:  Discontinued        1 g 200 mL/hr over 30 Minutes  Intravenous Every 8 hours 11/25/20 0128 11/25/20 1031   11/25/20 0300  metroNIDAZOLE (FLAGYL) IVPB 500 mg  Status:  Discontinued        500 mg 100 mL/hr over 60 Minutes Intravenous Every 8 hours 11/25/20 0158 11/28/20 0811   11/25/20 0200  vancomycin (VANCOCIN) IVPB 1000 mg/200 mL premix  Status:  Discontinued        1,000 mg 200 mL/hr over 60 Minutes Intravenous  Once 11/25/20 0111 11/25/20 0114   11/25/20 0200  vancomycin (VANCOREADY) IVPB 1250 mg/250 mL        1,250 mg 166.7 mL/hr over 90 Minutes Intravenous  Once 11/25/20 0114 11/25/20 1630   11/24/20 1400  levofloxacin (LEVAQUIN) IVPB 750 mg        750 mg 100 mL/hr over 90 Minutes Intravenous  Once 11/24/20 1357 11/24/20 1659      PRN meds: dextrose, guaiFENesin-dextromethorphan, ondansetron (ZOFRAN) IV   Objective: Vitals:   11/28/20 1126 11/28/20 1200  BP:  (!) 143/67  Pulse:    Resp:  19  Temp: 98.1 F (36.7 C)   SpO2:      Intake/Output Summary (Last 24 hours) at 11/28/2020 1620 Last data filed at 11/28/2020 0758 Gross per 24 hour  Intake 2138.26 ml  Output 350 ml  Net 1788.26 ml   Filed Weights   11/26/20 0400 11/27/20 0515  11/28/20 0330  Weight: 63.9 kg 69.2 kg 71.9 kg   Weight change: 2.7 kg Body mass index is 27.21 kg/m.   Physical Exam: General exam: Alert, awake, oriented x 2; following commands appropriately and in no acute distress.  Patient denies nausea, vomiting or abdominal pain.  Diet has been well-tolerated and the patient is no requiring oxygen supplementation.  Stable vital signs at this point without the use of pressors. Respiratory system: Good air movement bilaterally; no using accessory muscles. Cardiovascular system:RRR. No murmurs, rubs, gallops.  No JVD. Gastrointestinal system: Abdomen is nondistended, soft and nontender. No organomegaly or masses felt. Normal bowel sounds heard. Central nervous system: Alert and oriented. No focal neurological deficits. Extremities: No cyanosis or clubbing. Skin: No petechiae. Psychiatry: Mood & affect appropriate.    Data Review: I have personally reviewed the laboratory data and studies available.  Recent Labs  Lab 11/24/20 1236 11/24/20 1242 11/25/20 0437 11/25/20 0719 11/26/20 0500 11/27/20 0629 11/28/20 0445  WBC 12.4*  --  2.8* 3.6* 7.6 8.3 7.5  NEUTROABS 9.4*  --  2.0  --   --   --   --   HGB 16.4*   < > 12.9 12.2 11.1* 10.8* 11.7*  HCT 54.9*   < > 37.3 35.3* 33.2* 32.6* 36.2  MCV 92.7  --  80.2 80.0 82.8 82.1 83.6  PLT 299  --  168 132* 93* 90* 102*   < > = values in this interval not displayed.   Recent Labs  Lab 11/24/20 1633 11/24/20 2029 11/25/20 0437 11/25/20 0719 11/26/20 0500 11/27/20 0629 11/28/20 0445  NA 141   < > 142 141 144 138 139  K 4.5   < > 3.4* 3.5 3.3* 2.9* 3.0*  CL 107   < > 109 109 115* 110 108  CO2 <7*   < > 22 22 18* 20* 22  GLUCOSE 615*   < > 248* 214* 207* 255* 298*  BUN 40*   < > 41* 40* 39* 32* 28*  CREATININE 1.95*   < > 1.35* 1.38* 1.07* 0.85 0.77  CALCIUM 8.5*   < > 8.0*  7.9* 7.7* 7.9* 8.1*  MG 2.4  --   --   --  1.3* 2.0 2.1  PHOS 5.2*  --   --   --  2.6 1.9*  --    < > = values in this  interval not displayed.    F/u labs ordered  Signed, Vassie Lollarlos Zyron Deeley, MD [facility] 11/28/2020

## 2020-11-28 NOTE — Evaluation (Signed)
Physical Therapy Evaluation Patient Details Name: [name] MRN: [id number] DOB: 1951-01-04 Today's Date: 11/28/2020   History of Present Illness  [name]  is a 70 y.o. 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 Jolyne Loa in [place], patient presents to ED secondary to altered mental status, lives with significant other for last 20 years, who was available at bedside and provided history, report patient with known history of diabetes, she is using insulin pump, report he went to fill it today, and found it empty, but report there was some insulin in yesterday, report overall patient blood glucose usually elevated in the 300s, reports she has been more drowsy, lethargic and less responsive this morning, he does report she had a fall yesterday evening as well, she was unresponsive on the floor, somnolent not verbalized to them, EMS and her hypopneic as well, with respiratory rate around 10, they provide some supplemental bagging oxygen and route to the hospital, glucose was high.  The read, no additional IV medications were given, patient is obtunded and unable to provide any history currently.  - in ED via DKA, with pH<6.8, potassium 4, peaked T waves on EKG, glucose> 700, bicarb is pending, negative Covid 19 test, beta hydroxybutyric acid of>8, she was started on IV fluids, insulin drip, seen by PCCM, and [facility] consulted to admit.    Clinical Impression  Patient functioning near baseline for functional mobility and gait, slightly unsteady cadence during ambulation in room/hallways without loss of balance without use of an AD and tolerated sitting up in chair after therapy - RN notified.  Patient will benefit from continued physical therapy in hospital and recommended venue below to increase  strength, balance, endurance for safe ADLs and gait.     Follow Up Recommendations No PT follow up    Equipment Recommendations  None recommended by PT    Recommendations for Other Services       Precautions / Restrictions Precautions Precautions: Fall Restrictions Weight Bearing Restrictions: No      Mobility  Bed Mobility Overal bed mobility: Needs Assistance Bed Mobility: Supine to Sit     Supine to sit: Supervision;Modified independent (Device/Increase time)     General bed mobility comments: increased time, slightly labored movement    Transfers Overall transfer level: Needs assistance Equipment used: None;1 person hand held assist Transfers: Sit to/from [employer] Sit to Stand: Supervision Stand pivot transfers: Supervision          Ambulation/Gait Ambulation/Gait assistance: Supervision Gait Distance (Feet): 100 Feet Assistive device: None;1 person hand held assist Gait Pattern/deviations: Decreased step length - right;Decreased step length - left;Decreased stride length Gait velocity: decreased   General Gait Details: slightly labored cadence without loss of balance, limited mostly due to fatigue  Stairs            Wheelchair Mobility    Modified Rankin (Stroke Patients Only)       Balance Overall balance assessment: Mild deficits observed, not formally tested                                           Pertinent Vitals/Pain Pain Assessment: No/denies pain    Home Living Family/patient expects to be discharged to:: Private residence Living Arrangements: Spouse/significant other (Boyfriend)  Available Help at Discharge: Family;Available 24 hours/day Type of Home: Mobile home Home Access: Stairs to enter Entrance Stairs-Rails: Right Entrance Stairs-Number of Steps: 2-3 Home Layout: One level Home Equipment: Cane - single point;Shower seat - built in      Prior Function Level of Independence:  Independent         Comments: Tourist information centre manager, drives     Higher education careers adviser   Dominant Hand: Right    Extremity/Trunk Assessment   Upper Extremity Assessment Upper Extremity Assessment: Overall WFL for tasks assessed    Lower Extremity Assessment Lower Extremity Assessment: Generalized weakness    Cervical / Trunk Assessment Cervical / Trunk Assessment: Normal  Communication   Communication: No difficulties  Cognition Arousal/Alertness: Awake/alert Behavior During Therapy: WFL for tasks assessed/performed Overall Cognitive Status: Within Functional Limits for tasks assessed                                        General Comments      Exercises     Assessment/Plan    PT Assessment Patient needs continued PT services  PT Problem List Decreased strength;Decreased activity tolerance;Decreased balance;Decreased mobility       PT Treatment Interventions DME instruction;Gait training;Stair training;Functional mobility training;Therapeutic activities;Therapeutic exercise;Balance training;Patient/family education    PT Goals (Current goals can be found in the Care Plan section)  Acute Rehab PT Goals Patient Stated Goal: return home with family to assist PT Goal Formulation: With patient Time For Goal Achievement: 12/02/20 Potential to Achieve Goals: Good    Frequency Min 2X/week   Barriers to discharge        Co-evaluation               AM-PAC PT "6 Clicks" Mobility  Outcome Measure Help needed turning from your back to your side while in a flat bed without using bedrails?: None Help needed moving from lying on your back to sitting on the side of a flat bed without using bedrails?: None Help needed moving to and from a bed to a chair (including a wheelchair)?: None Help needed standing up from a chair using your arms (e.g., wheelchair or bedside chair)?: None Help needed to walk in hospital room?: A Little Help needed climbing 3-5  steps with a railing? : A Little 6 Click Score: 22    End of Session   Activity Tolerance: Patient tolerated treatment well;Patient limited by fatigue Patient left: in chair Nurse Communication: Mobility status PT Visit Diagnosis: Unsteadiness on feet (R26.81);Other abnormalities of gait and mobility (R26.89);Muscle weakness (generalized) (M62.81)    Time: 7622-6333 PT Time Calculation (min) (ACUTE ONLY): 23 min   Charges:   PT Evaluation $PT Eval Moderate Complexity: 1 Mod PT Treatments $Therapeutic Activity: 23-37 mins        4:17 PM, 11/28/20 Ocie Bob, MPT Physical Therapist with [facility] [phone] office [phone] mobile phone

## 2020-11-29 DIAGNOSIS — G9341 Metabolic encephalopathy: Secondary | ICD-10-CM | POA: Diagnosis not present

## 2020-11-29 DIAGNOSIS — E860 Dehydration: Secondary | ICD-10-CM | POA: Diagnosis not present

## 2020-11-29 DIAGNOSIS — N179 Acute kidney failure, unspecified: Secondary | ICD-10-CM | POA: Diagnosis not present

## 2020-11-29 DIAGNOSIS — I5022 Chronic systolic (congestive) heart failure: Secondary | ICD-10-CM | POA: Diagnosis not present

## 2020-11-29 LAB — CBC
HCT: 32.5 % — ABNORMAL LOW (ref 36.0–46.0)
Hemoglobin: 10.8 g/dL — ABNORMAL LOW (ref 12.0–15.0)
MCH: 27.8 pg (ref 26.0–34.0)
MCHC: 33.2 g/dL (ref 30.0–36.0)
MCV: 83.5 fL (ref 80.0–100.0)
Platelets: 94 10*3/uL — ABNORMAL LOW (ref 150–400)
RBC: 3.89 MIL/uL (ref 3.87–5.11)
RDW: 14.9 % (ref 11.5–15.5)
WBC: 3.5 10*3/uL — ABNORMAL LOW (ref 4.0–10.5)
nRBC: 0 % (ref 0.0–0.2)

## 2020-11-29 LAB — BASIC METABOLIC PANEL
Anion gap: 7 (ref 5–15)
BUN: 27 mg/dL — ABNORMAL HIGH (ref 8–23)
CO2: 23 mmol/L (ref 22–32)
Calcium: 8 mg/dL — ABNORMAL LOW (ref 8.9–10.3)
Chloride: 107 mmol/L (ref 98–111)
Creatinine, Ser: 0.69 mg/dL (ref 0.44–1.00)
GFR, Estimated: >60 mL/min (ref >60)
Glucose, Bld: 285 mg/dL — ABNORMAL HIGH (ref 70–99)
Potassium: 2.6 mmol/L — CL (ref 3.5–5.1)
Sodium: 137 mmol/L (ref 135–145)

## 2020-11-29 LAB — GLUCOSE, CAPILLARY
Glucose-Capillary: 188 mg/dL — ABNORMAL HIGH (ref 70–99)
Glucose-Capillary: 193 mg/dL — ABNORMAL HIGH (ref 70–99)
Glucose-Capillary: 222 mg/dL — ABNORMAL HIGH (ref 70–99)
Glucose-Capillary: 260 mg/dL — ABNORMAL HIGH (ref 70–99)
Glucose-Capillary: 261 mg/dL — ABNORMAL HIGH (ref 70–99)
Glucose-Capillary: 292 mg/dL — ABNORMAL HIGH (ref 70–99)

## 2020-11-29 LAB — MAGNESIUM: Magnesium: 1.9 mg/dL (ref 1.7–2.4)

## 2020-11-29 MED ORDER — POTASSIUM CHLORIDE CRYS ER 20 MEQ PO TBCR
40.0000 meq | EXTENDED_RELEASE_TABLET | Freq: Three times a day (TID) | ORAL | Status: AC
  Administered 2020-11-29 (×3): 40 meq via ORAL
  Filled 2020-11-29 (×3): qty 2

## 2020-11-29 MED ORDER — INSULIN ASPART 100 UNIT/ML ~~LOC~~ SOLN
0.0000 [IU] | Freq: Every day | SUBCUTANEOUS | Status: DC
  Administered 2020-11-29: 3 [IU] via SUBCUTANEOUS

## 2020-11-29 MED ORDER — INSULIN ASPART 100 UNIT/ML ~~LOC~~ SOLN
0.0000 [IU] | Freq: Three times a day (TID) | SUBCUTANEOUS | Status: DC
  Administered 2020-11-29 – 2020-11-30 (×4): 3 [IU] via SUBCUTANEOUS

## 2020-11-29 MED ORDER — INSULIN ASPART 100 UNIT/ML ~~LOC~~ SOLN
3.0000 [IU] | Freq: Three times a day (TID) | SUBCUTANEOUS | Status: DC
  Administered 2020-11-29 – 2020-11-30 (×4): 3 [IU] via SUBCUTANEOUS

## 2020-11-29 NOTE — TOC Progression Note (Signed)
Transition of Care [facility]) - Progression Note    Patient Details  Name: [name] MRN: [id number] Date of Birth: [birth year]  [facility] of [facility] CM/SW Contact  Leitha Bleak, RN Phone Number: 11/29/2020, 11:05 AM  Clinical Narrative:    Sister requesting Home Health RN for medication management. No preferences. Alroy Bailiff with [facility] accepted the referral. TOC to watch for orders at discharge. MD updated  Expected Discharge Plan: Home w Home Health Services Barriers to Discharge: Continued Medical Work up  Expected Discharge Plan and Services Expected Discharge Plan: Home w Home Health Services In-house Referral: Clinical Social Work Discharge Planning Services: NA Post Acute Care Choice: Home Health Living arrangements for the past 2 months: Single Family Home                 DME Arranged: N/A DME Agency: NA       HH Arranged: RN HH Agency: [facility] Date HH Agency Contacted: 11/29/20 Time HH Agency Contacted: 1104 Representative spoke with at [facility] Agency: Alroy Bailiff  Readmission Risk Interventions Readmission Risk Prevention Plan 11/29/2020  Transportation Screening Complete  PCP or Specialist Appt within 3-5 Days Complete  HRI or Home Care Consult Complete  Social Work Consult for Recovery Care Planning/Counseling Complete  Palliative Care Screening Not Applicable  Medication Review Oceanographer) Complete  Some recent data might be hidden

## 2020-11-29 NOTE — Progress Notes (Signed)
Inpatient Diabetes Program Recommendations  AACE/ADA: New Consensus Statement on Inpatient Glycemic Control (2015)  Target Ranges:  Prepandial:   less than 140 mg/dL      Peak postprandial:   less than 180 mg/dL (1-2 hours)      Critically ill patients:  140 - 180 mg/dL   Lab Results  Component Value Date   GLUCAP 222 (H) 11/29/2020   HGBA1C 13.3 (H) 11/24/2020    Review of Glycemic Control Results for [name], [name] (MRN [id number]) as of 11/29/2020 09:02  Ref. Range 11/29/2020 00:24 11/29/2020 03:55 11/29/2020 07:27  Glucose-Capillary Latest Ref Range: 70 - 99 mg/dL 867 (H) 619 (H) 509 (H)   Per DM coordinator note from 1/10- "Given multiple hospital admissions for DKA, patient's noncompliance with glucose monitoring and bolusing for meals, A1C, and noted cognitive decline,would not recommend patient resume her insulin pump(also recommendation of her Endocrinologist Dr. Shawnee Knapp per last office visit note on 09/20/2020)."  Home DM Meds: V-Go 20 insulin pump with Novolog (provides Zamora total of 20 units of basal per 24 hours), 3 clicks with meals (each click is 2 units of insulin)                             Tresiba 18 units daily  Current Orders: Lantus 20 units QHS                            Novolog Sensitive Correction Scale/ SSI (0-9 units) Q4 hours     Consider the following:  1. Change the Novolog SSi to TID ac + hs  2. Start Novolog Meal Coverage: Novolog 6 units TID with meals  (Please add the following Hold Parameters: Hold if pt eats <50% of meal, Hold if pt NPO)  Thanks, Lujean Rave, MSN, RNC-OB Diabetes Coordinator [phone] (8a-5p)

## 2020-11-29 NOTE — TOC Initial Note (Signed)
Transition of Care [facility]) - Initial/Assessment Note   Patient Details  Name: [name] MRN: [id number] Date of Birth: 06/24/1951  Transition of Care [facility]) CM/SW Contact:    Ewing Schlein, LCSW Phone Number: 11/29/2020, 10:35 AM  Clinical Narrative: Patient is a 70 year old female who was admitted for acute metabolic encephalopathy. Patient has history of type 2 diabetes mellitus, mood disorder, AKI, schizophrenia, and chronic systolic CHF. Readmission checklist completed due to high readmission score.  CSW completed assessment with patient's sister due to patient's orientation. Per sister, patient resides with her significant other. Patient does not have an DME or HH services, but sister requested [facility] as patient has issues with medication management. Patient is otherwise able to afford her medications. Sister reported she sets up two weeks of medication for the patient at a time in a pill box, but patient still forgets which affects her diabetes management. Patient is mostly independent with ADLs at baseline with the SO assisting as needed. Patient and her SO provide her transportation to appointments. Sister provided updated PCP information, which was added to Epic.  CSW scheduled PCP appointment for 12/02/20 at 3:30pm. Appointment added to AVS. TOC to follow.  Expected Discharge Plan: Home w Home Health Services Barriers to Discharge: Continued Medical Work up  Patient Goals and CMS Choice Patient states their goals for this hospitalization and ongoing recovery are:: Return home CMS Medicare.gov Compare Post Acute Care list provided to:: Patient Represenative (must comment) Woody Seller (sister)) Choice offered to / list presented to : Sibling  Expected Discharge Plan and Services Expected Discharge Plan: Home w Home Health Services In-house Referral: Clinical Social Work Discharge Planning Services: NA Post Acute Care Choice: Home Health Living arrangements for the past 2  months: Single Family Home             DME Arranged: N/A DME Agency: NA HH Arranged: RN  Prior Living Arrangements/Services Living arrangements for the past 2 months: Single Family Home Lives with:: Significant Other Patient language and need for interpreter reviewed:: Yes Do you feel safe going back to the place where you live?: Yes      Need for Family Participation in Patient Care: Yes (Comment) Care giver support system in place?: Yes (comment) Criminal Activity/Legal Involvement Pertinent to Current Situation/Hospitalization: No - Comment as needed  Permission Sought/Granted Permission sought to share information with : Other (comment) Permission granted to share info w AGENCY: HH agencies  Emotional Assessment Appearance:: Appears stated age Orientation: : Oriented to Self,Oriented to Place,Oriented to Situation Alcohol / Substance Use: Not Applicable Psych Involvement: No (comment)  Admission diagnosis:  DKA (diabetic ketoacidosis) (HCC) [E11.10] Diabetic ketoacidosis with coma associated with type 1 diabetes mellitus (HCC) [E10.11] Patient Active Problem List   Diagnosis Date Noted  . SVT (supraventricular tachycardia) (HCC) 11/25/2020  . DKA (diabetic ketoacidosis) (HCC) 11/24/2020  . Diabetic ketoacidosis with coma associated with type 1 diabetes mellitus (HCC)   . Acute metabolic encephalopathy 12/05/2019  . Chronic systolic CHF (congestive heart failure) (HCC) 12/05/2019  . Acute on chronic systolic CHF (congestive heart failure) (HCC) 04/19/2017  . Arterial hypotension   . DKA (diabetic ketoacidoses) 04/15/2017  . Thrombocytopenia (HCC) 09/26/2016  . Pressure injury of skin 09/25/2016  . Metabolic acidosis 09/24/2016  . DKA, type 2 (HCC) 09/24/2016  . Pneumonia due to Staphylococcus (HCC) 11/08/2014  . 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 History of stroke 11/08/2014  . Chronic systolic congestive heart failure (HCC) 11/08/2014  .  Anemia of  chronic disease 11/08/2014  . Dysphagia 11/08/2014  . Schizophrenia (HCC) 11/08/2014  . Bacteremia due to Staphylococcus aureus   . Dehydration   . Anoxic brain injury (HCC)   . AKI (acute kidney injury) (HCC)   . Severe sepsis with septic shock (HCC) 10/02/2014  . Encephalopathy acute 10/02/2014  . Altered mental status 10/01/2014  . Foreign body alimentary tract 10/01/2014  . Cardiac arrest (HCC)   . Foreign body ingestion   . Diabetes (HCC) 05/22/2014  . Diabetic ketoacidosis associated with type 2 diabetes mellitus (HCC) 05/22/2014  . Mood disorder (HCC) 05/22/2014   PCP:  Jettie Pagan, NP Pharmacy:   [facility] [phone] - [place], [place] - [address] [place] [address] Phone: [phone] Fax: [phone]  Readmission Risk Interventions Readmission Risk Prevention Plan 11/29/2020  Transportation Screening Complete  PCP or Specialist Appt within 3-5 Days Complete  HRI or Home Care Consult Complete  Social Work Consult for Recovery Care Planning/Counseling Complete  Palliative Care Screening Not Applicable  Medication Review Oceanographer) Complete  Some recent data might be hidden

## 2020-11-29 NOTE — Progress Notes (Signed)
PROGRESS NOTE  [name]  DOB: 01-14-1951  PCP: Jettie Pagan, NP ZOX:[phone]  DOA: 11/24/2020  LOS: 5 days   Chief Complaint  Patient presents with  . Respiratory Distress    Brief narrative: ITZELL BENDAVID is a 70 y.o. 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  Patient was brought to the ED on 1/6 by spouse for worsening lethargy over a period of 24 hours.  Per spouse, he noted that she might have ran out of insulin in her pump for the duration.  In the ED, patient was noted to be obtunded with labs suggesting severe DKA with pH of 6.8, potassium 4, glucose more than 700. EKG with peaked T waves.  Patient was started on IV insulin drip, IV fluid.  Chest x-ray showed developing infiltrates probably due to aspiration Patient was admitted to hospitalist service  On the evening of admission, patient went to SVT, given adenosine to convert back to normal sinus rhythm.  She was also noted to be in septic shock and hence central line placement was obtained by general surgery.   Critical care consultation was obtained.   Over the course of hospitalization, she was weaned off insulin drip, remains on phenylephrine.  Mental status still minimally responsive.  Subjective: Oriented x2; in no acute distress.  Denies chest pain, no nausea, no vomiting.  Tolerating diet and currently afebrile.  Assessment/Plan: Diabetic ketoacidosis -Initial pH of 6.8 and glucose more than 700 -Managed per DKA protocol with IV fluid, IV insulin drip. -Currently on subcutaneous insulin. -Continue to follow CBGs.  Poorly controlled type 2 diabetes mellitus -A1c 13.3 on 1/6.  Apparently it is an improvement compared to an A1c of 15 few months ago. -On insulin pump at home. -Continue sliding scale insulin and Lantus;  NovoLog for meal coverage also started. -Continue modified carbohydrate diet -Further adjustment to hypoglycemic regimen to be done based on CBGs fluctuation. -Patient will be completely weaned off Solu-Cortef  Recent Labs  Lab 11/28/20 2129 11/29/20 0024 11/29/20 0355 11/29/20 0727 11/29/20 1127  GLUCAP 416* 292* 260* 222* 188*   Septic shock secondary aspiration pneumonia -On admission, patient was in severe sepsis which progressed to septic shock in few hours -Septic shock has resolved -Will completely wean Solu-Cortef off and assess vital signs. -Transition antibiotics to oral regimen -Anticipate discharge 11/29/2020 -Sepsis parameters as below with improving WBC count, lactic acid and procalcitonin level.  Continue to monitor. -Not requiring supplemental oxygen at this time. -Repeat chest x-ray demonstrating no pneumothorax or significant abnormalities for concerns.  Recent Labs  Lab 11/25/20 0152 11/25/20 0437 11/25/20 0719 11/26/20 0500 11/27/20 0629 11/28/20 0445 11/29/20 0351  WBC  --  2.8* 3.6* 7.6 8.3 7.5 3.5*  LATICACIDVEN 3.8* 2.2*  --  1.4  --   --   --   PROCALCITON 9.40  --   --  3.28  --   --   --    Acute metabolic encephalopathy -History of anoxic brain injury related to cardiac arrest. -Unclear baseline mental status.  -Oriented times 2 and following commands appropriately today. -Physical therapy evaluation has found patient to be functioning near her baseline and recommended safe to discharge home without home health services or outpatient PT.  No equipment recommended. -Will recommend outpatient follow-up with neurology service to further screen patient for underlying dementia and treat appropriately if  needed.  AKI -Secondary to sepsis -Creatinine normal and back to baseline currently.   -Continue to maintain adequate hydration.  Recent Labs    11/24/20 1633 11/24/20 2029 11/24/20 2306 11/25/20 0152 11/25/20 0437 11/25/20 0719  11/26/20 0500 11/27/20 0629 11/28/20 0445 11/29/20 0351  BUN 40* 40* 41* 39* 41* 40* 39* 32* 28* 27*  CREATININE 1.95* 1.76* 1.61* 1.49* 1.35* 1.38* 1.07* 0.85 0.77 0.69   Chronic systolic CHF (congestive heart failure) -EF of 35 to 40% from 2018.  -2D echo demonstrating ejection fraction 65 to 70% during this admission.  No regional wall motion abnormalities and only determine left ventricle diastolic parameters.  There was no significant valvular disorder appreciated. -Volume status stable overall. -Follow daily weights and strict intake and output; low-sodium diet encouraged.  SVT (supraventricular tachycardia)  -Probably related to sepsis.   -Resolved with adenosine x1 dose.   -Currently in sinus rhythm and stable.   -Continue telemetry monitoring and assure electrolyte stability.  Hypokalemia/hypomagnesemia -Labs this morning with potassium low at 2.6 -Continue to follow electrolytes trend and further replete as needed. -Repeat basic metabolic panel in AM.  Mobility: Encourage ambulation.  Unclear baseline physical status.  PT eval ordered. Code Status:   Code Status: Full Code  Nutritional status: Body mass index is 27.21 kg/m.     Diet Order            Diet Carb Modified Fluid consistency: Thin; Room service appropriate? Yes  Diet effective now                 DVT prophylaxis: SCDs Start: 11/24/20 1525   Antimicrobials:  Cefdinir and Flagyl Fluid: LR at 100 mL/h Consultants: Critical care Family Communication:  None at bedside  Status is: Inpatient  Remains inpatient appropriate because:Hemodynamically unstable   Dispo: The patient is from: Home              Anticipated d/c is to: Home               Anticipated d/c date is: 1 day              Patient currently is not medically stable to d/c.  Patient found with low potassium (K2.6); still with elevated CBGs.       Infusions:    Scheduled Meds: . cefdinir  300 mg Oral Q12H  . Chlorhexidine  Gluconate Cloth  6 each Topical Daily  . insulin aspart  0-15 Units Subcutaneous TID WC  . insulin aspart  0-5 Units Subcutaneous QHS  . insulin aspart  3 Units Subcutaneous TID WC  . insulin glargine  20 Units Subcutaneous QHS  . metroNIDAZOLE  500 mg Oral Q8H  . potassium chloride  40 mEq Oral TID    Antimicrobials: Anti-infectives (From admission, onward)   Start     Dose/Rate Route Frequency Ordered Stop   11/28/20 1000  metroNIDAZOLE (FLAGYL) tablet 500 mg        500 mg Oral Every 8 hours 11/28/20 0811     11/28/20 1000  cefdinir (OMNICEF) capsule 300 mg        300 mg Oral Every 12 hours 11/28/20 0811     11/26/20 1200  vancomycin (VANCOCIN) IVPB 1000 mg/200 mL premix  Status:  Discontinued        1,000 mg 200 mL/hr over 60 Minutes Intravenous Every 36 hours 11/25/20 0128 11/25/20 1427   11/25/20 1130  ceFEPIme (MAXIPIME) 2 g in sodium chloride 0.9 % 100 mL IVPB  Status:  Discontinued        2 g 200 mL/hr over 30 Minutes Intravenous Every 12 hours 11/25/20 1034 11/28/20 0811   11/25/20 0600  aztreonam (AZACTAM) 1 g in sodium chloride 0.9 % 100 mL IVPB  Status:  Discontinued        1 g 200 mL/hr over 30 Minutes Intravenous Every 8 hours 11/25/20 0128 11/25/20 1031   11/25/20 0300  metroNIDAZOLE (FLAGYL) IVPB 500 mg  Status:  Discontinued        500 mg 100 mL/hr over 60 Minutes Intravenous Every 8 hours 11/25/20 0158 11/28/20 0811   11/25/20 0200  vancomycin (VANCOCIN) IVPB 1000 mg/200 mL premix  Status:  Discontinued        1,000 mg 200 mL/hr over 60 Minutes Intravenous  Once 11/25/20 0111 11/25/20 0114   11/25/20 0200  vancomycin (VANCOREADY) IVPB 1250 mg/250 mL        1,250 mg 166.7 mL/hr over 90 Minutes Intravenous  Once 11/25/20 0114 11/25/20 1630   11/24/20 1400  levofloxacin (LEVAQUIN) IVPB 750 mg        750 mg 100 mL/hr over 90 Minutes Intravenous  Once 11/24/20 1357 11/24/20 1659      PRN meds: acetaminophen, dextrose, guaiFENesin-dextromethorphan, ondansetron  (ZOFRAN) IV   Objective: Vitals:   11/29/20 1100 11/29/20 1124  BP: (!) 129/53   Pulse: 80   Resp: 20   Temp:  99.2 F (37.3 C)  SpO2: 100%    No intake or output data in the 24 hours ending 11/29/20 1504 Filed Weights   11/26/20 0400 11/27/20 0515 11/28/20 0330  Weight: 63.9 kg 69.2 kg 71.9 kg   Weight change:  Body mass index is 27.21 kg/m.   Physical Exam: General exam: Alert, awake, oriented x 2; no chest pain, no nausea, no vomiting, no using accessory muscles on mucosa to lesion on room air.  Reports feeling slightly weak but otherwise no acute complaints.  No overnight events.  Patient is afebrile Respiratory system: Good air movement bilaterally; no wheezing, no crackles.  Not using accessory muscles. Cardiovascular system:RRR. No murmurs, rubs, gallops.  No JVD. Gastrointestinal system: Abdomen is nondistended, soft and nontender. No organomegaly or masses felt. Normal bowel sounds heard. Central nervous system: Alert and oriented. No focal neurological deficits. Extremities: No cyanosis or clubbing Skin: No rashes, no petechiae. Psychiatry: Mood & affect appropriate.    Data Review: I have personally reviewed the laboratory data and studies available.  Recent Labs  Lab 11/24/20 1236 11/24/20 1242 11/25/20 0437 11/25/20 0719 11/26/20 0500 11/27/20 0629 11/28/20 0445 11/29/20 0351  WBC 12.4*  --  2.8* 3.6* 7.6 8.3 7.5 3.5*  NEUTROABS 9.4*  --  2.0  --   --   --   --   --   HGB 16.4*   < > 12.9 12.2 11.1* 10.8* 11.7* 10.8*  HCT 54.9*   < > 37.3 35.3* 33.2* 32.6* 36.2 32.5*  MCV 92.7  --  80.2 80.0 82.8 82.1 83.6 83.5  PLT 299  --  168 132* 93* 90* 102* 94*   < > = values in this interval not displayed.   Recent Labs  Lab 11/24/20 1633 11/24/20 2029 11/25/20 0719 11/26/20 0500 11/27/20 0629 11/28/20 0445 11/29/20 0351  NA 141   < > 141 144 138 139 137  K 4.5   < > 3.5 3.3* 2.9* 3.0* 2.6*  CL 107   < > 109 115* 110 108 107  CO2 <7*   < >  22 18* 20*  22 23  GLUCOSE 615*   < > 214* 207* 255* 298* 285*  BUN 40*   < > 40* 39* 32* 28* 27*  CREATININE 1.95*   < > 1.38* 1.07* 0.85 0.77 0.69  CALCIUM 8.5*   < > 7.9* 7.7* 7.9* 8.1* 8.0*  MG 2.4  --   --  1.3* 2.0 2.1 1.9  PHOS 5.2*  --   --  2.6 1.9*  --   --    < > = values in this interval not displayed.    F/u labs ordered  Signed, Vassie Loll, MD [facility] 11/29/2020

## 2020-11-30 DIAGNOSIS — G9341 Metabolic encephalopathy: Secondary | ICD-10-CM | POA: Diagnosis not present

## 2020-11-30 DIAGNOSIS — E860 Dehydration: Secondary | ICD-10-CM | POA: Diagnosis not present

## 2020-11-30 DIAGNOSIS — I5022 Chronic systolic (congestive) heart failure: Secondary | ICD-10-CM | POA: Diagnosis not present

## 2020-11-30 DIAGNOSIS — F39 Unspecified mood [affective] disorder: Secondary | ICD-10-CM

## 2020-11-30 DIAGNOSIS — E876 Hypokalemia: Secondary | ICD-10-CM

## 2020-11-30 DIAGNOSIS — N179 Acute kidney failure, unspecified: Secondary | ICD-10-CM | POA: Diagnosis not present

## 2020-11-30 LAB — GLUCOSE, CAPILLARY
Glucose-Capillary: 154 mg/dL — ABNORMAL HIGH (ref 70–99)
Glucose-Capillary: 137 mg/dL — ABNORMAL HIGH (ref 70–99)

## 2020-11-30 LAB — CULTURE, BLOOD (ROUTINE X 2)
Culture: NO GROWTH
Culture: NO GROWTH
Special Requests: ADEQUATE
Special Requests: ADEQUATE

## 2020-11-30 MED ORDER — POTASSIUM CHLORIDE ER 10 MEQ PO TBCR: 20.0000 meq | EXTENDED_RELEASE_TABLET | Freq: Every day | ORAL | 0 refills | Status: DC

## 2020-11-30 MED ORDER — METRONIDAZOLE 500 MG PO TABS: 500.0000 mg | ORAL_TABLET | Freq: Three times a day (TID) | ORAL | 0 refills | Status: AC

## 2020-11-30 MED ORDER — CEFDINIR 300 MG PO CAPS: 300.0000 mg | ORAL_CAPSULE | Freq: Two times a day (BID) | ORAL | 0 refills | Status: AC

## 2020-11-30 NOTE — TOC Transition Note (Signed)
Transition of Care [facility]) - CM/SW Discharge Note   Patient Details  Name: [name] MRN: [id number] Date of Birth: 09/19/1951  [facility]) CM/SW Contact:  Leitha Bleak, RN Phone Number: 11/30/2020, 11:09 AM   Clinical Narrative:   Patient discharging home, [name] with [facility] updated to start home health. Orders have been placed. Added to AVS.     Final next level of care: Home w Home Health Services Barriers to Discharge: Barriers Resolved   Patient Goals and CMS Choice Patient states their goals for this hospitalization and ongoing recovery are:: Return home CMS Medicare.gov Compare Post Acute Care list provided to:: Patient Represenative (must comment) Woody Seller (sister)) Choice offered to / list presented to : Sibling  Discharge Placement           Patient and family notified of of transfer: 11/30/20  Discharge Plan and Services In-house Referral: Clinical Social Work Discharge Planning Services: NA Post Acute Care Choice: Home Health          DME Arranged: N/A DME Agency: NA       HH Arranged: RN HH Agency: [facility] Date HH Agency Contacted: 11/29/20 Time HH Agency Contacted: 1104 Representative spoke with at [facility] Agency: Alroy Bailiff  Readmission Risk Interventions Readmission Risk Prevention Plan 11/30/2020 11/29/2020  Transportation Screening - Complete  PCP or Specialist Appt within 5-7 Days Complete -  PCP or Specialist Appt within 3-5 Days - Complete  Home Care Screening Complete -  Medication Review (RN CM) Complete -  HRI or Home Care Consult - Complete  Social Work Consult for Recovery Care Planning/Counseling - Complete  Palliative Care Screening - Not Applicable  Medication Review Oceanographer) - Complete  Some recent data might be hidden

## 2020-11-30 NOTE — Progress Notes (Signed)
Discharge instructions reviewed and to have home health RN and follow up with primary and neurology outpatient.  Scripts sent to pharmacy.  Significant other present and giving ride home. Transported by WC to entrance.

## 2020-11-30 NOTE — Plan of Care (Signed)
  Problem: Education: Goal: Knowledge of General Education information will improve Description: Including pain rating scale, medication(s)/side effects and non-pharmacologic comfort measures Outcome: Adequate for Discharge   Problem: Health Behavior/Discharge Planning: Goal: Ability to manage health-related needs will improve Outcome: Adequate for Discharge   Problem: Clinical Measurements: Goal: Ability to maintain clinical measurements within normal limits will improve Outcome: Adequate for Discharge Goal: Will remain free from infection Outcome: Adequate for Discharge Goal: Diagnostic test results will improve Outcome: Adequate for Discharge Goal: Respiratory complications will improve Outcome: Adequate for Discharge Goal: Cardiovascular complication will be avoided Outcome: Adequate for Discharge   Problem: Activity: Goal: Risk for activity intolerance will decrease Outcome: Adequate for Discharge   Problem: Nutrition: Goal: Adequate nutrition will be maintained Outcome: Adequate for Discharge   Problem: Coping: Goal: Level of anxiety will decrease Outcome: Adequate for Discharge   Problem: Elimination: Goal: Will not experience complications related to bowel motility Outcome: Adequate for Discharge Goal: Will not experience complications related to urinary retention Outcome: Adequate for Discharge   Problem: Pain Managment: Goal: General experience of comfort will improve Outcome: Adequate for Discharge   Problem: Safety: Goal: Ability to remain free from injury will improve Outcome: Adequate for Discharge   Problem: Skin Integrity: Goal: Risk for impaired skin integrity will decrease Outcome: Adequate for Discharge   Problem: Activity: Goal: Ability to tolerate increased activity will improve Outcome: Adequate for Discharge   Problem: Clinical Measurements: Goal: Ability to maintain a body temperature in the normal range will improve Outcome: Adequate  for Discharge   Problem: Respiratory: Goal: Ability to maintain adequate ventilation will improve Outcome: Adequate for Discharge Goal: Ability to maintain a clear airway will improve Outcome: Adequate for Discharge   Problem: Education: Goal: Ability to describe self-care measures that may prevent or decrease complications (Diabetes Survival Skills Education) will improve Outcome: Adequate for Discharge Goal: Individualized Educational Video(s) Outcome: Adequate for Discharge   Problem: Cardiac: Goal: Ability to maintain an adequate cardiac output will improve Outcome: Adequate for Discharge   Problem: Health Behavior/Discharge Planning: Goal: Ability to identify and utilize available resources and services will improve Outcome: Adequate for Discharge Goal: Ability to manage health-related needs will improve Outcome: Adequate for Discharge   Problem: Fluid Volume: Goal: Ability to achieve a balanced intake and output will improve Outcome: Adequate for Discharge   Problem: Metabolic: Goal: Ability to maintain appropriate glucose levels will improve Outcome: Adequate for Discharge   Problem: Nutritional: Goal: Maintenance of adequate nutrition will improve Outcome: Adequate for Discharge Goal: Maintenance of adequate weight for body size and type will improve Outcome: Adequate for Discharge   Problem: Respiratory: Goal: Will regain and/or maintain adequate ventilation Outcome: Adequate for Discharge   Problem: Urinary Elimination: Goal: Ability to achieve and maintain adequate renal perfusion and functioning will improve Outcome: Adequate for Discharge   Problem: Acute Rehab PT Goals(only PT should resolve) Goal: Pt Will Go Supine/Side To Sit Outcome: Adequate for Discharge Goal: Patient Will Transfer Sit To/From Stand Outcome: Adequate for Discharge Goal: Pt Will Transfer Bed To Chair/Chair To Bed Outcome: Adequate for Discharge Goal: Pt Will Ambulate Outcome:  Adequate for Discharge

## 2020-11-30 NOTE — Discharge Summary (Signed)
Physician Discharge Summary  Abigail RutherfordDeborah A Zamora JWJ:[id number]:[id number] DOB: Nov 19, 1951 DOA: 11/24/2020  PCP: Jettie PaganFalstreau, Brooke A, NP  Admit date: 11/24/2020 Discharge date: 11/30/2020  Time spent: 35 minutes  Recommendations for Outpatient Follow-up:  1. Close monitoring of patient CBGs with further adjustment to hypoglycemia regimen as required. 2. Reassess blood pressure and adjust antihypertensive regimen as needed 3. Repeat basic metabolic panel to follow-up lites and renal function 4. Outpatient referral to neurology to further assess presence of dementia and to initiate treatment as needed.   Discharge Diagnoses:  Active Problems:   Diabetic ketoacidosis associated with type 2 diabetes mellitus (HCC)   Mood disorder (HCC)   Severe sepsis with septic shock (HCC)   AKI (acute kidney injury) (HCC)   Dehydration   Schizophrenia (HCC)   Acute metabolic encephalopathy   Chronic systolic CHF (congestive heart failure) (HCC)   SVT (supraventricular tachycardia) (HCC)   Hypokalemia   Discharge Condition: Stable and improved.  Discharged home with instructions to follow-up with PCP in 10 days.  CODE STATUS: Full code.  Diet recommendation: Modified carbohydrate diet and heart healthy/low-sodium.  Filed Weights   11/26/20 0400 11/27/20 0515 11/28/20 0330  Weight: 63.9 kg 69.2 kg 71.9 kg    History of present illness:  Refer to H&P written by Dr. Randol KernElgergawy for further input/details; briefly: Abigail RutherfordDeborah A Zamora is a 70 y.o. 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  Patient was brought to the ED on 1/6 by spouse for worsening lethargy over a period of 24 hours.  Per spouse, he noted that she might have ran out of insulin in her pump for the duration.  In the ED, patient was noted to be obtunded with labs suggesting severe  DKA with pH of 6.8, potassium 4, glucose more than 700. EKG with peaked T waves.  Patient was started on IV insulin drip, IV fluid.  Chest x-ray showed developing infiltrates probably due to aspiration Patient was admitted to hospitalist service  On the evening of admission, patient went to SVT, given adenosine to convert back to normal sinus rhythm.  She was also noted to be in septic shock and hence central line placement was obtained by general surgery.   Critical care consultation was obtained.   Hospital Course:  Diabetic ketoacidosis -Initial pH of 6.8 and glucose more than 700 -Managed per DKA protocol with IV fluid and IV insulin drip. -process successfully corrected -discharge back on SSI and long acting insulin as per home regimen. -Continue close monitoring of patient's CBGs with further adjustment to hypoglycemia regimen as required.  Poorly controlled type 2 diabetes mellitus -A1c 13.3 on 1/6.  Apparently it is an improvement compared to an A1c of 15 few months ago. -will resume home hypoglycemic regimen -patient advised to follow modified carb diet -reassess CBG's and further adjust hypoglycemic regimen as needed.  Septic shock secondary aspiration pneumonia -On admission, patient was in severe sepsis which progressed to septic shock and ended requiring the use of Solu-Cortef and pressors. -Septic shock has resolved -Patient off Solu-Cortef -Antibiotics transition to oral regimen to complete therapy (4 more days pending at discharge) -Sepsis parameters resolved at discharge. -Not requiring supplemental oxygen at this time.  Acute metabolic encephalopathy -History of anoxic brain injury related to cardiac arrest. -Unclear baseline mental status.  -Oriented times 2 and following commands appropriately. -Physical therapy evaluation has found patient  to be functioning near her baseline and recommended safe to discharge home without home health services or outpatient  PT.  No equipment recommended. -Will recommend outpatient follow-up with neurology service to further screen patient for underlying dementia and treat appropriately if needed.  AKI -Secondary to sepsis and prerenal azotemia -Creatinine significantly improved and back to normal range currently.   -Continue to maintain adequate hydration. -Avoid the use of nephrotoxic agents and hypotension.  Chronic systolic CHF (congestive heart failure) -EF of 35 to 40% from 2018.  -2D echo demonstrating ejection fraction 65 to 70% during this admission.  No regional wall motion abnormalities and undetermine left ventricle diastolic parameters.  There was no significant valvular disorder appreciated. -Volume status stable overall and well-controlled. -Continue to follow low-sodium diet, daily weights maintain adequate hydration -Will resume the use of beta-blocker. -Continue outpatient follow-up with cardiology service.  SVT (supraventricular tachycardia)  -Probably related to sepsis and rebound tachycardia.   -Resolved with adenosine x1 dose.   -Currently in sinus rhythm and stable. -Will resume home metoprolol dose -Continue to follow maintain electrolytes within normal limits.  Hypokalemia/hypomagnesemia -Repleted and is stable at time of discharge -Patient will continue using daily potassium for maintenance purposes and will recommend repeat basic metabolic panel at follow-up visit to reassess electrolyte stability.   Procedures: See below for x-ray reports  Consultations:  PCCM  Discharge Exam: Vitals:   11/30/20 0253 11/30/20 0531  BP: 127/73 128/74  Pulse: 93 94  Resp: 19 18  Temp: 98.1 F (36.7 C) 98.3 F (36.8 C)  SpO2: 95% 93%    General: Afebrile, no chest pain, no nausea, no vomiting.  Patient in no acute distress and hemodynamically stable. Cardiovascular: S1 and S2, no rubs, no gallops, no JVD appreciated on exam. Respiratory: Good air movement bilaterally; no  requiring oxygen supplementation.  No using accessory muscles. Abdomen: Soft, nontender, nondistended, positive bowel sounds Extremities: No cyanosis or clubbing.  Discharge Instructions   Discharge Instructions    (HEART FAILURE PATIENTS) Call MD:  Anytime you have any of the following symptoms: 1) 3 pound weight gain in 24 hours or 5 pounds in 1 week 2) shortness of breath, with or without a dry hacking cough 3) swelling in the hands, feet or stomach 4) if you have to sleep on extra pillows at night in order to breathe.   Complete by: As directed    Diet - low sodium heart healthy   Complete by: As directed    Discharge instructions   Complete by: As directed    Maintain adequate hydration Follow modified carbohydrate diet and heart healthy/low-sodium diet Arrange follow-up with PCP in 10 days.  Be compliant with your insulin therapy. Take medications as prescribed Check your weight on daily basis.     Allergies as of 11/30/2020      Reactions   Penicillins Rash   Did it involve swelling of the face/tongue/throat, SOB, or low BP? Unknown Did it involve sudden or severe rash/hives, skin peeling, or any reaction on the inside of your mouth or nose? Unknown Did you need to seek medical attention at a hospital or doctor's office? Unknown When did it last happen? If all above answers are "NO", may proceed with cephalosporin use.      Medication List    STOP taking these medications   hydrOXYzine 25 MG tablet Commonly known as: ATARAX/VISTARIL     TAKE these medications   ARIPiprazole 30 MG tablet Commonly known as: ABILIFY Take 30 mg  by mouth at bedtime.   aspirin 81 MG EC tablet Take 81 mg by mouth at bedtime.   atorvastatin 20 MG tablet Commonly known as: LIPITOR Take 20 mg by mouth daily.   benztropine 1 MG tablet Commonly known as: COGENTIN Take 1 mg by mouth at bedtime.   buPROPion 75 MG tablet Commonly known as: WELLBUTRIN Take 75 mg by mouth 2 (two)  times daily.   cefdinir 300 MG capsule Commonly known as: OMNICEF Take 1 capsule (300 mg total) by mouth every 12 (twelve) hours for 4 days.   insulin aspart 100 UNIT/ML injection Commonly known as: novoLOG Inject three times daily with meals.  Correction coverage: Moderate (average weight, post-op) CBG < 70: implement hypoglycemia protocol CBG 70 - 120: 0 units CBG 121 - 150: 2 units CBG 151 - 200: 3 units CBG 201 - 250: 5 units CBG 251 - 300: 8 units CBG 301 - 350: 11 units CBG 351 - 400: 15 units CBG > 400: call MD and obtain STAT lab verification What changed:   how to take this  when to take this  additional instructions   metoprolol succinate 25 MG 24 hr tablet Commonly known as: TOPROL-XL Take 12.5 mg by mouth daily.   metroNIDAZOLE 500 MG tablet Commonly known as: FLAGYL Take 1 tablet (500 mg total) by mouth every 8 (eight) hours for 4 days.   mirtazapine 30 MG tablet Commonly known as: REMERON Take 30 mg by mouth at bedtime.   Oysco 500 500 MG Tabs Generic drug: Oyster Shell Calcium Take 1 tablet by mouth daily.   potassium chloride 10 MEQ tablet Commonly known as: KLOR-CON Take 2 tablets (20 mEq total) by mouth daily for 15 days.   Evaristo Bury FlexTouch 100 UNIT/ML FlexTouch Pen Generic drug: insulin degludec Inject 25 Units into the skin daily.      Allergies  Allergen Reactions  . Penicillins Rash    Did it involve swelling of the face/tongue/throat, SOB, or low BP? Unknown Did it involve sudden or severe rash/hives, skin peeling, or any reaction on the inside of your mouth or nose? Unknown Did you need to seek medical attention at a hospital or doctor's office? Unknown When did it last happen? If all above answers are "NO", may proceed with cephalosporin use.     Follow-up Information    Jettie Pagan, NP Follow up on 12/02/2020.   Specialty: Nurse Practitioner Why: Appointment is for 3:30pm. Contact information: [address]  [place] [address] [phone]        [facility].   Specialty: Home Health Services Why:  Home Health RN - will call within 48 hours to set up first appointment              The results of significant diagnostics from this hospitalization (including imaging, microbiology, ancillary and laboratory) are listed below for reference.    Significant Diagnostic Studies: CT Head Wo Contrast  Result Date: 11/24/2020 CLINICAL DATA:  Altered mental status. EXAM: CT HEAD WITHOUT CONTRAST TECHNIQUE: Contiguous axial images were obtained from the base of the skull through the vertex without intravenous contrast. COMPARISON:  December 04, 2019. 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 No mass effect or midline shift is noted. Ventricular size is within normal limits. There is no evidence of mass lesion, hemorrhage or acute infarction. Vascular: No hyperdense vessel or unexpected calcification. Skull: Normal. Negative for fracture or focal lesion. Sinuses/Orbits: No acute finding.  Other: None. 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 No acute intracranial abnormality seen. Electronically Signed   By: Lupita Raider M.D.   On: 11/24/2020 14:04   DG Chest Port 1 View  Result Date: 11/27/2020 CLINICAL DATA:  Central line placement, history of CHF EXAM: PORTABLE CHEST 1 VIEW COMPARISON:  Radiograph 11/25/2020 FINDINGS: Right IJ approach catheter tip terminates at the superior cavoatrial junction. No residual right apical pneumothorax is evident. Redemonstrated apical bullous disease. Chronically coarsened interstitial and bronchitic features are similar to priors with some interval clearing of the more coalescent opacity seen in the right mid to upper lung. Stable cardiomediastinal contours with a calcified aorta. No acute osseous or soft tissue abnormality. Degenerative changes are present in the imaged spine and shoulders.  Telemetry leads and external support devices overlie the chest. IMPRESSION: Right IJ approach catheter tip terminates at the superior cavoatrial junction. No residual right apical pneumothorax is appreciable though evaluation limited by apical bullous disease. Diminished opacity in the right mid to upper lung may reflect improving pneumonia or edema. Background of chronic interstitial and bronchitic changes. Electronically Signed   By: Kreg Shropshire M.D.   On: 11/27/2020 05:59   DG Chest Port 1 View  Result Date: 11/25/2020 CLINICAL DATA:  Central line ingested. EXAM: PORTABLE CHEST 1 VIEW COMPARISON:  Earlier today. FINDINGS: The right jugular catheter has been retracted with its tip in the inferior aspect of the superior vena cava. Due to bullous changes at the right lung apex, a small pneumothorax is difficult to exclude. Stable right mid and upper lung zone airspace opacity. Stable mild prominence of the interstitial markings. No pleural fluid. Stable mildly enlarged cardiac silhouette. Diffuse osteopenia. IMPRESSION: 1. Right jugular catheter tip in the inferior aspect of the superior vena cava. 2. A small right apical pneumothorax cannot be excluded due to bullous changes. 3. Stable right mid and upper lung zone probable pneumonia. 4. Stable mild cardiomegaly and mild chronic interstitial lung disease. Electronically Signed   By: Beckie Salts M.D.   On: 11/25/2020 10:36   DG CHEST PORT 1 VIEW  Result Date: 11/25/2020 CLINICAL DATA:  Central venous catheter placement EXAM: PORTABLE CHEST 1 VIEW COMPARISON:  11/24/2020 FINDINGS: Right internal jugular central venous catheter tip is seen within the right atrium. No pneumothorax. Progressive consolidation within the left mid and upper lung zone peripherally. Superimposed chronic interstitial changes are again noted. No pleural effusion. Cardiac size within normal limits. IMPRESSION: Right internal jugular central venous catheter tip within the right atrium.  No pneumothorax. Progressive right mid lung zone consolidation. Electronically Signed   By: Helyn Numbers MD   On: 11/25/2020 04:13   DG Chest Portable 1 View  Result Date: 11/24/2020 CLINICAL DATA:  Hypoxia and altered mental status. EXAM: PORTABLE CHEST 1 VIEW COMPARISON:  12/04/2019 and prior radiographs FINDINGS: The cardiomediastinal silhouette is unremarkable. Equivocal airspace opacity overlying the mid RIGHT lung noted. Mild chronic interstitial opacities are again identified. There is no evidence of pleural effusion or pneumothorax IMPRESSION: Equivocal airspace opacity overlying the mid RIGHT lung, which may represent infection. Electronically Signed   By: Harmon Pier M.D.   On: 11/24/2020 13:34   ECHOCARDIOGRAM COMPLETE  Result Date: 11/26/2020    ECHOCARDIOGRAM REPORT   Patient Name:   [facility] A Oliphant Date of Exam: 11/26/2020 Medical Rec #:  [id number]           Height:       66.0 in Accession #:    [id number]  Weight:       140.9 lb Date of Birth:  Apr 17, 1951           BSA:          1.723 m Patient Age:    52 years            BP:           129/66 mmHg Patient Gender: F                   HR:           84 bpm. [facility] Procedure: 2D Echo Indications:    CHF-Acute Systolic I50.21  History:        Patient has prior history of Echocardiogram examinations, most                 recent 04/16/2017. CHF, Stroke, Signs/Symptoms:Bacteremia; Risk                 Factors:Diabetes and Former Smoker.                 Diabetic ketoacidosis associated with type 2 diabetes mellitus ,                 Cardiac Arrest, SVT, Schizophrenia.  Sonographer:    Jeryl Columbia RDCS (AE) Referring Phys: [id number] [facility] IMPRESSIONS  1. Left ventricular ejection fraction, by estimation, is 65 to 70%. The left ventricle has normal function. The left ventricle has no regional wall motion abnormalities. There is mild left ventricular hypertrophy. Left ventricular diastolic parameters are  indeterminate.  2. Right ventricular systolic function is normal. The right ventricular size is normal.  3. The mitral valve is normal in structure. No evidence of mitral valve regurgitation.  4. The aortic valve is abnormal. Aortic valve regurgitation is mild. Mild aortic valve sclerosis is present, with no evidence of aortic valve stenosis.  5. The inferior vena cava is normal in size with greater than 50% respiratory variability, suggesting right atrial pressure of 3 mmHg. FINDINGS  Left Ventricle: Left ventricular ejection fraction, by estimation, is 65 to 70%. The left ventricle has normal function. The left ventricle has no regional wall motion abnormalities. The left ventricular internal cavity size was normal in size. There is  mild left ventricular hypertrophy. Left ventricular diastolic parameters are indeterminate. Right Ventricle: The right ventricular size is normal. Right vetricular wall thickness was not assessed. Right ventricular systolic function is normal. Left Atrium: Left atrial size was normal in size. Right Atrium: Right atrial size was normal in size. Pericardium: There is no evidence of pericardial effusion. Mitral Valve: The mitral valve is normal in structure. No evidence of mitral valve regurgitation. Tricuspid Valve: The tricuspid valve is normal in structure. Tricuspid valve regurgitation is trivial. Aortic Valve: The aortic valve is abnormal. Aortic valve regurgitation is mild. Aortic regurgitation PHT measures 347 msec. Mild aortic valve sclerosis is present, with no evidence of aortic valve stenosis. Pulmonic Valve: The pulmonic valve was grossly normal. Pulmonic valve regurgitation is not visualized. Aorta: The aortic root is normal in size and structure. Venous: The inferior vena cava is normal in size with greater than 50% respiratory variability, suggesting right atrial pressure of 3 mmHg. IAS/Shunts: The interatrial septum was not assessed.  LEFT VENTRICLE PLAX 2D LVIDd:          3.59 cm  Diastology LVIDs:         2.22 cm  LV e' medial:  6.53 cm/s LV PW:         1.26 cm  LV E/e' medial:  18.1 LV IVS:        1.08 cm  LV e' lateral:   7.72 cm/s LVOT diam:     1.80 cm  LV E/e' lateral: 15.3 LVOT Area:     2.54 cm  RIGHT VENTRICLE RV S prime:     13.90 cm/s TAPSE (M-mode): 2.1 cm LEFT ATRIUM           Index       RIGHT ATRIUM           Index LA diam:      2.40 cm 1.39 cm/m  RA Area:     10.20 cm LA Vol (A2C): 43.6 ml 25.30 ml/m RA Volume:   17.50 ml  10.16 ml/m LA Vol (A4C): 29.6 ml 17.18 ml/m  AORTIC VALVE AI PHT:      347 msec  AORTA Ao Root diam: 3.00 cm MITRAL VALVE MV Area (PHT): 3.30 cm     SHUNTS MV Decel Time: 230 msec     Systemic Diam: 1.80 cm MV E velocity: 118.00 cm/s MV A velocity: 98.50 cm/s MV E/A ratio:  1.20 Dietrich Pates MD Electronically signed by Dietrich Pates MD Signature Date/Time: 11/26/2020/2:09:17 PM    Final     Microbiology: Recent Results (from the past 240 hour(s))  Resp Panel by RT-PCR (Flu A&B, Covid) Nasopharyngeal Swab     Status: None   Collection Time: 11/24/20 12:37 PM   Specimen: Nasopharyngeal Swab; Nasopharyngeal(NP) swabs in vial transport medium  Result Value Ref Range Status   SARS Coronavirus 2 by RT PCR NEGATIVE NEGATIVE Final    Comment: (NOTE) SARS-CoV-2 target nucleic acids are NOT DETECTED.  The SARS-CoV-2 RNA is generally detectable in upper respiratory specimens during the acute phase of infection. The lowest concentration of SARS-CoV-2 viral copies this assay can detect is 138 copies/mL. A negative result does not preclude SARS-Cov-2 infection and should not be used as the sole basis for treatment or other patient management decisions. A negative result may occur with  improper specimen collection/handling, submission of specimen other than nasopharyngeal swab, presence of viral mutation(s) within the areas targeted by this assay, and inadequate number of viral copies(<138 copies/mL). A negative result must be combined  with clinical observations, patient history, and epidemiological information. The expected result is Negative.  Fact Sheet for Patients:  BloggerCourse.com  Fact Sheet for Healthcare Providers:  SeriousBroker.it  This test is no t yet approved or cleared by the [place] FDA and  has been authorized for detection and/or diagnosis of SARS-CoV-2 by FDA under an Emergency Use Authorization (EUA). This EUA will remain  in effect (meaning this test can be used) for the duration of the COVID-19 declaration under Section 564(b)(1) of the Act, 21 U.S.C.section 360bbb-3(b)(1), unless the authorization is terminated  or revoked sooner.       Influenza A by PCR NEGATIVE NEGATIVE Final   Influenza B by PCR NEGATIVE NEGATIVE Final    Comment: (NOTE) The Xpert Xpress SARS-CoV-2/FLU/RSV plus assay is intended as an aid in the diagnosis of influenza from Nasopharyngeal swab specimens and should not be used as a sole basis for treatment. Nasal washings and aspirates are unacceptable for Xpert Xpress SARS-CoV-2/FLU/RSV testing.  Fact Sheet for Patients: BloggerCourse.com  Fact Sheet for Healthcare Providers: SeriousBroker.it  This test is not yet approved or cleared by the [place] and has been authorized for  detection and/or diagnosis of SARS-CoV-2 by FDA under an Emergency Use Authorization (EUA). This EUA will remain in effect (meaning this test can be used) for the duration of the COVID-19 declaration under Section 564(b)(1) of the Act, 21 U.S.C. section 360bbb-3(b)(1), unless the authorization is terminated or revoked.  Performed at [facility], [address], [place], [place] [address]   MRSA PCR Screening     Status: None   Collection Time: 11/24/20 10:04 PM   Specimen: Nasal Mucosa; Nasopharyngeal  Result Value Ref Range Status   MRSA by PCR NEGATIVE NEGATIVE Final     Comment:        The GeneXpert MRSA Assay (FDA approved for NASAL specimens only), is one component of a comprehensive MRSA colonization surveillance program. It is not intended to diagnose MRSA infection nor to guide or monitor treatment for MRSA infections. Performed at [facility], [address], [place], [place] [address]   Culture, blood (x 2)     Status: None   Collection Time: 11/25/20  1:44 AM   Specimen: BLOOD LEFT HAND  Result Value Ref Range Status   Specimen Description BLOOD LEFT HAND  Final   Special Requests   Final    BOTTLES DRAWN AEROBIC AND ANAEROBIC Blood Culture adequate volume   Culture   Final    NO GROWTH 5 DAYS Performed at [facility], [address] [place], [place] [address]    Report Status 11/30/2020 FINAL  Final  Culture, blood (x 2)     Status: None   Collection Time: 11/25/20  1:52 AM   Specimen: BLOOD RIGHT HAND  Result Value Ref Range Status   Specimen Description BLOOD RIGHT HAND  Final   Special Requests   Final    BOTTLES DRAWN AEROBIC AND ANAEROBIC Blood Culture adequate volume   Culture   Final    NO GROWTH 5 DAYS Performed at [facility], [address] [place], [place] [address]    Report Status 11/30/2020 FINAL  Final  Culture, respiratory (non-expectorated)     Status: None   Collection Time: 11/25/20  1:52 AM   Specimen: Tracheal Aspirate; Respiratory  Result Value Ref Range Status   Specimen Description   Final    TRACHEAL ASPIRATE Performed at [facility], [address] [place], [place] [address]    Special Requests   Final    Normal Performed at [facility], [address], [place], [place] [address]    Gram Stain   Final    RARE WBC PRESENT,BOTH PMN AND MONONUCLEAR MODERATE YEAST MODERATE GRAM NEGATIVE RODS FEW GRAM POSITIVE RODS    Culture   Final    MODERATE Normal respiratory flora-no Staph aureus or Pseudomonas seen Performed at [facility], [address], [place], [place] [address]    Report  Status 11/27/2020 FINAL  Final     Labs: Basic Metabolic Panel: Recent Labs  Lab 11/24/20 1633 11/24/20 2029 11/25/20 0719 11/26/20 0500 11/27/20 0629 11/28/20 0445 11/29/20 0351  NA 141   < > 141 144 138 139 137  K 4.5   < > 3.5 3.3* 2.9* 3.0* 2.6*  CL 107   < > 109 115* 110 108 107  CO2 <7*   < > 22 18* 20* 22 23  GLUCOSE 615*   < > 214* 207* 255* 298* 285*  BUN 40*   < > 40* 39* 32* 28* 27*  CREATININE 1.95*   < > 1.38* 1.07* 0.85 0.77 0.69  CALCIUM 8.5*   < > 7.9* 7.7* 7.9* 8.1* 8.0*  MG 2.4  --   --  1.3* 2.0 2.1 1.9  PHOS 5.2*  --   --  2.6 1.9*  --   --    < > = values in this interval not displayed.   Liver Function Tests: Recent Labs  Lab 11/25/20 0152  AST 18  ALT 14  ALKPHOS 94  BILITOT 1.2  PROT 6.5  ALBUMIN 3.4*   CBC: Recent Labs  Lab 11/24/20 1236 11/24/20 1242 11/25/20 0437 11/25/20 0719 11/26/20 0500 11/27/20 0629 11/28/20 0445 11/29/20 0351  WBC 12.4*  --  2.8* 3.6* 7.6 8.3 7.5 3.5*  NEUTROABS 9.4*  --  2.0  --   --   --   --   --   HGB 16.4*   < > 12.9 12.2 11.1* 10.8* 11.7* 10.8*  HCT 54.9*   < > 37.3 35.3* 33.2* 32.6* 36.2 32.5*  MCV 92.7  --  80.2 80.0 82.8 82.1 83.6 83.5  PLT 299  --  168 132* 93* 90* 102* 94*   < > = values in this interval not displayed.   CBG: Recent Labs  Lab 11/29/20 0727 11/29/20 1127 11/29/20 1601 11/29/20 2102 11/30/20 0723  GLUCAP 222* 188* 193* 261* 154*    Signed:  Vassie Loll MD.  [facility] 11/30/2020, 8:30 AM

## 2021-03-22 IMAGING — CT CT HEAD W/O CM
3 series · 16 of 47 positions shown, 19 images · non-contrast
Comparison: December 04, 2019.

CLINICAL DATA: Altered mental status.

EXAM:
CT HEAD WITHOUT CONTRAST
TECHNIQUE: Contiguous axial images were obtained from the base of the skull
through the vertex without intravenous contrast.

[Series 2: head w o · axial · 0.41mm/px · z∈[+30,+155]mm · 10 of 31 slices shown, 13 images]
[im 3/31  brain]
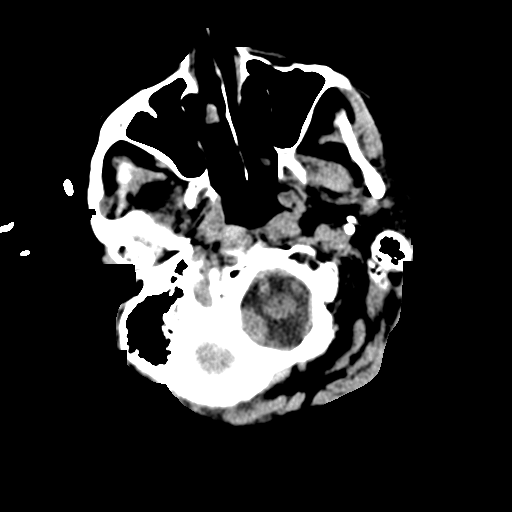
[im 3/31  bone]
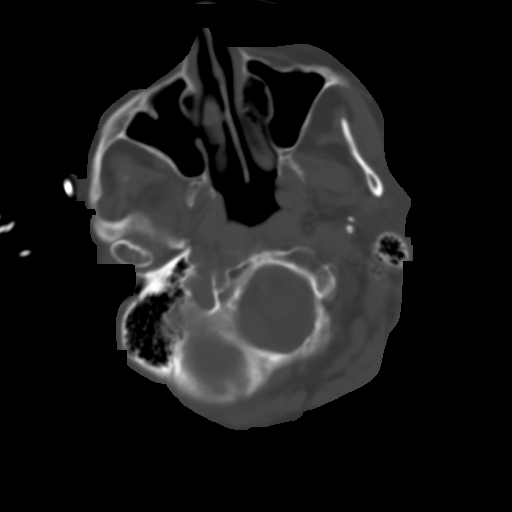
[im 6/31  brain]
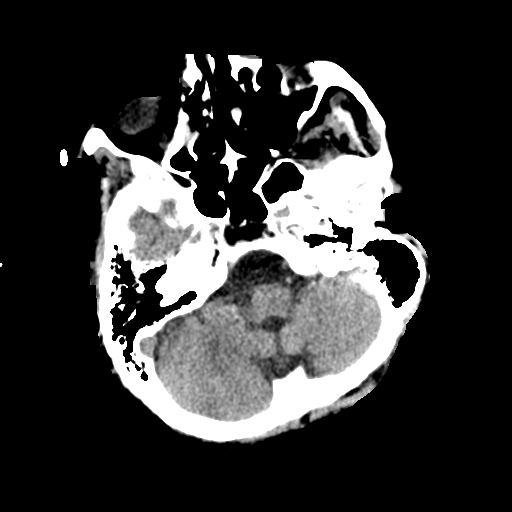
[im 9/31  brain]
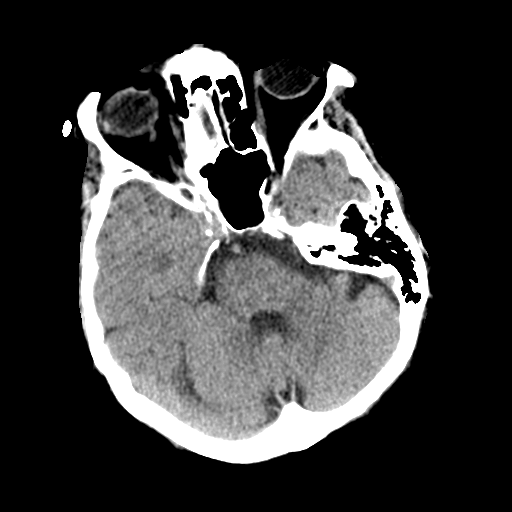
[im 11/31  brain]
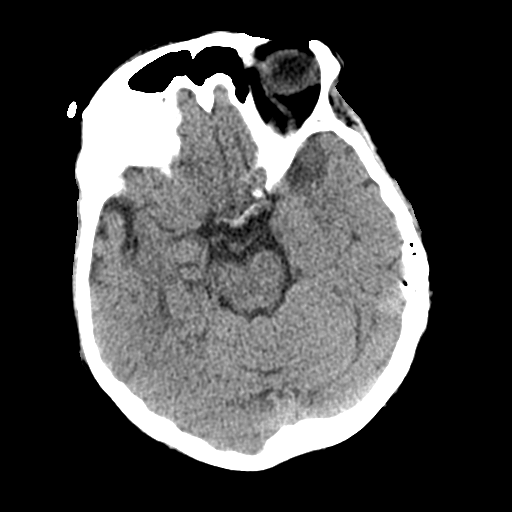
[im 14/31  brain]
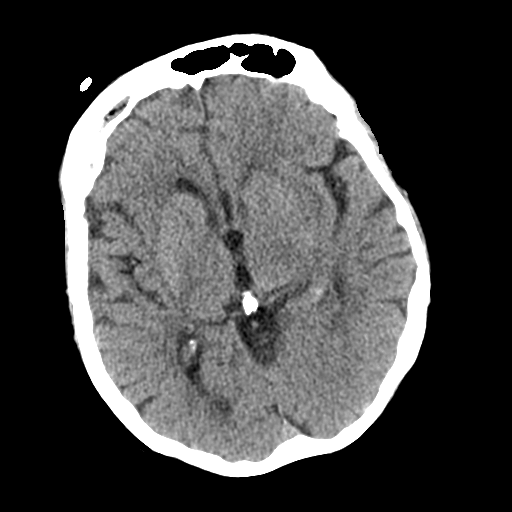
[im 14/31  bone]
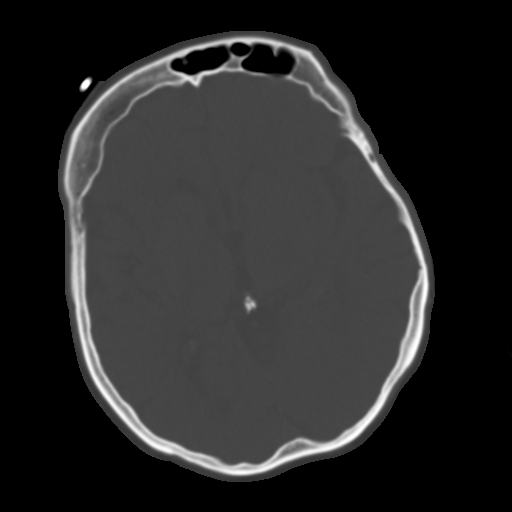
[im 17/31  brain]
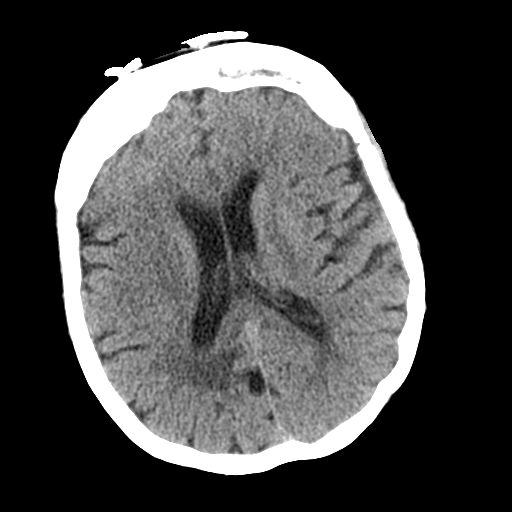
[im 20/31  brain]
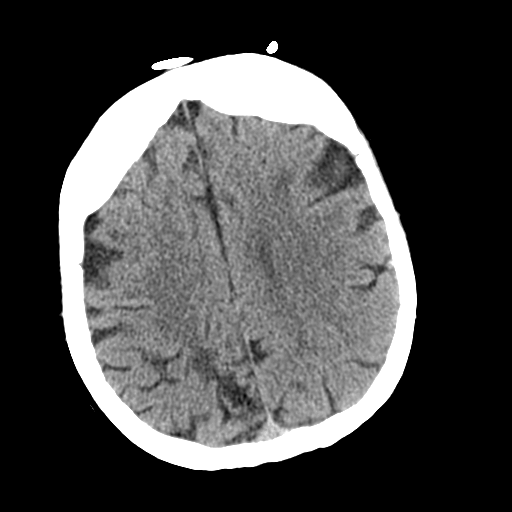
[im 23/31  brain]
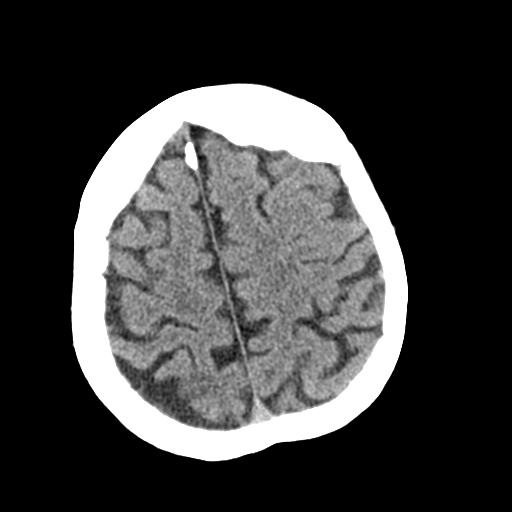
[im 25/31  brain]
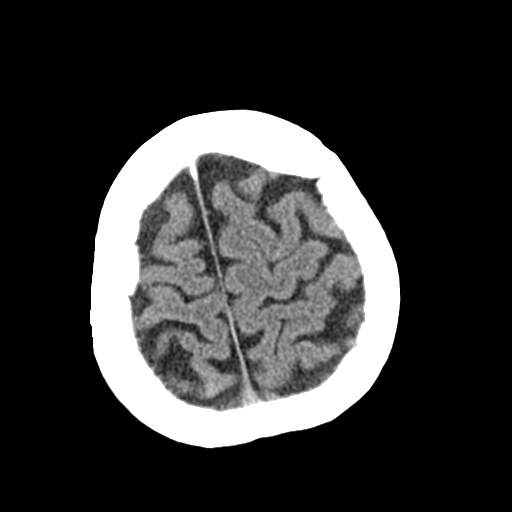
[im 25/31  bone]
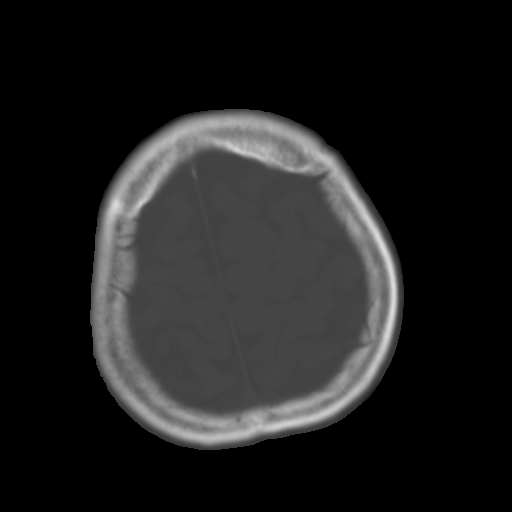
[im 28/31  brain]
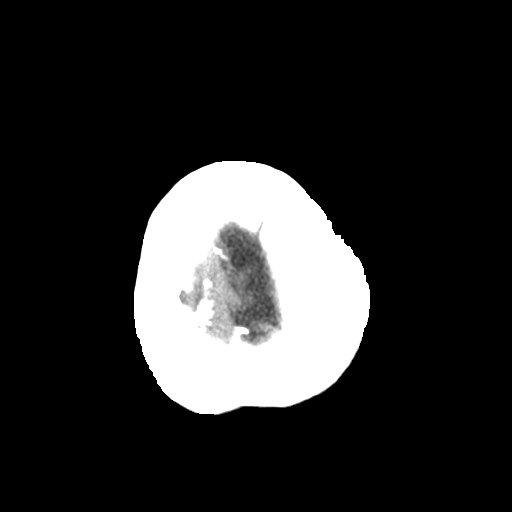

[Series 4: coronal soft · coronal · 0.31mm/px · 3 of 65 slices shown]
[im 22/65  brain]
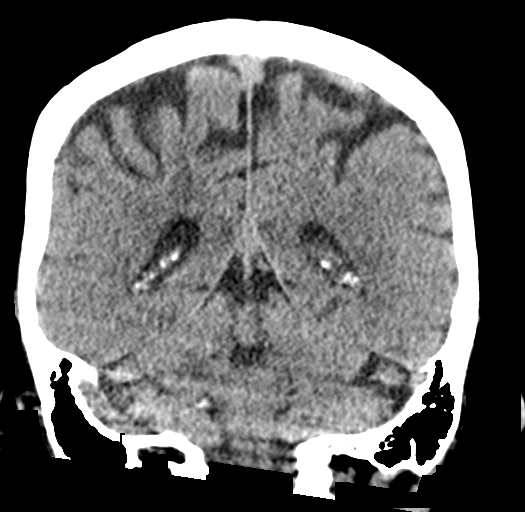
[im 29/65  brain]
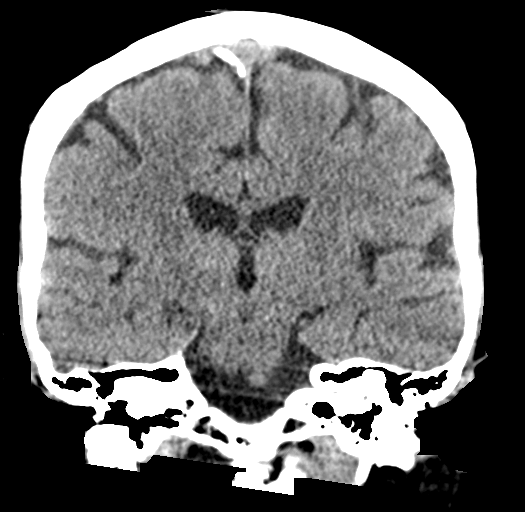
[im 36/65  brain]
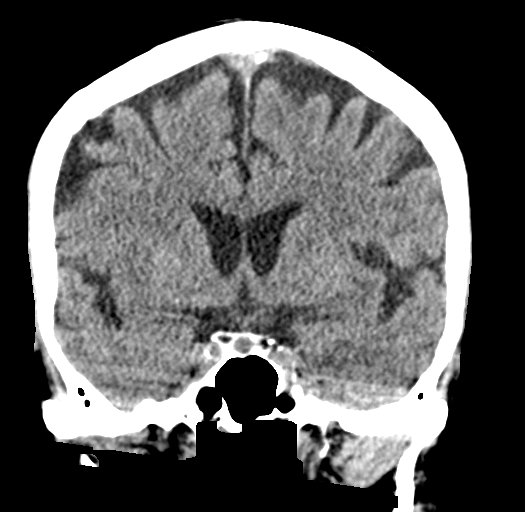

[Series 5: sagittal soft · sagittal · 0.31mm/px · 3 of 56 slices shown]
[im 22/56  brain]
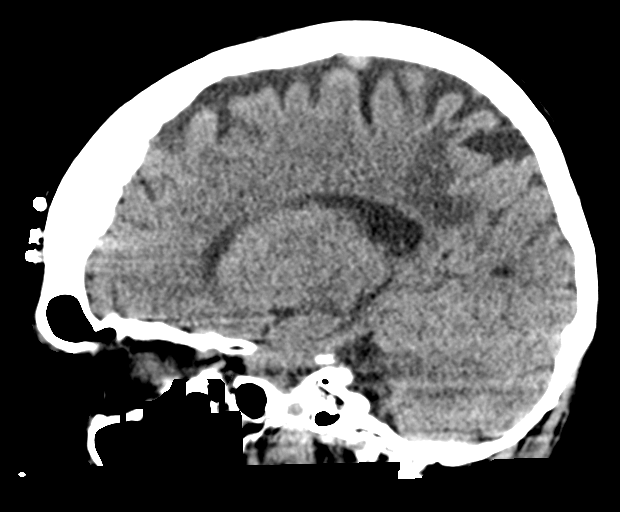
[im 28/56  brain]
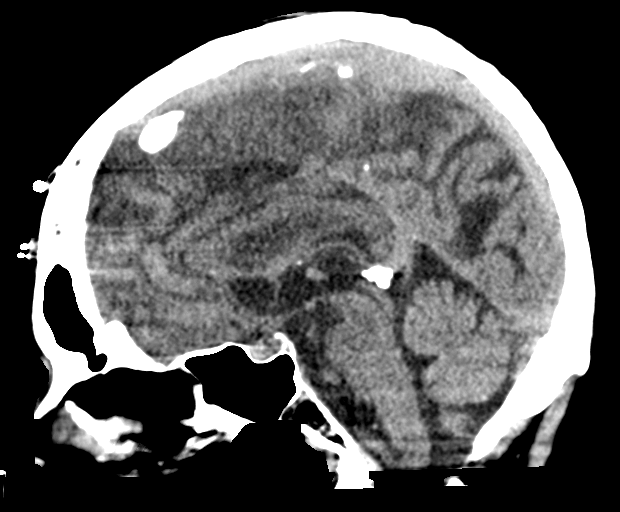
[im 34/56  brain]
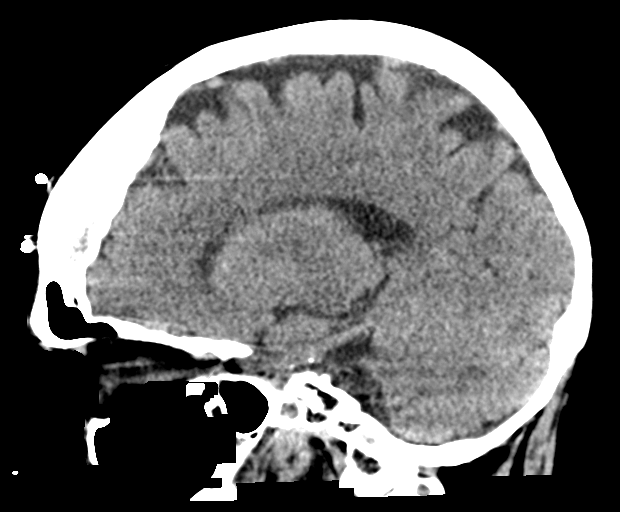

[16 of 47 positions shown; findings below may reference images not displayed]

FINDINGS: Brain: Old right posterior parietal infarction is noted. No mass
effect or midline shift is noted. Ventricular size is within normal
limits. There is no evidence of mass lesion, hemorrhage or acute
infarction.

Vascular: No hyperdense vessel or unexpected calcification.

Skull: Normal. Negative for fracture or focal lesion.

Sinuses/Orbits: No acute finding.

Other: None.
IMPRESSION: Old right posterior parietal infarction. No acute intracranial
abnormality seen.

## 2021-04-27 ENCOUNTER — Encounter: Payer: Self-pay | Admitting: Endocrinology

## 2021-08-07 ENCOUNTER — Encounter: Payer: Medicare Other | Admitting: Family Medicine

## 2021-08-07 ENCOUNTER — Encounter: Payer: Medicare Other | Admitting: Student

## 2022-01-03 ENCOUNTER — Encounter: Payer: Self-pay | Admitting: Registered Nurse

## 2022-01-03 ENCOUNTER — Ambulatory Visit (INDEPENDENT_AMBULATORY_CARE_PROVIDER_SITE_OTHER): Payer: Medicare Other | Admitting: Registered Nurse

## 2022-01-03 VITALS — BP 124/70 | HR 81 | Temp 98.0°F | Wt 143.4 lb

## 2022-01-03 DIAGNOSIS — E1122 Type 2 diabetes mellitus with diabetic chronic kidney disease: Secondary | ICD-10-CM

## 2022-01-03 DIAGNOSIS — Z794 Long term (current) use of insulin: Secondary | ICD-10-CM | POA: Diagnosis not present

## 2022-01-03 DIAGNOSIS — E2839 Other primary ovarian failure: Secondary | ICD-10-CM | POA: Diagnosis not present

## 2022-01-03 MED ORDER — OYSCO 500 500 MG PO TABS: 1.0000 | ORAL_TABLET | Freq: Every day | ORAL | 1 refills | Status: DC

## 2022-01-03 NOTE — Progress Notes (Signed)
Established Patient Office Visit  Subjective:  Patient ID: [name], female    DOB: 1951-07-24  Age: 71 y.o. MRN: [id number]  CC:  Chief Complaint  Patient presents with   New Patient (Initial Visit)    Previously with [facility] unable to hold bladder, gets sudden urges and cannot make it to the bathroom    Diabetes    Previously seen by Dr. Shawnee Knapp, wanting to know if Luan Pulling can take over diabetes management     HPI [name] presents for visit to est care.   Two chief complaints today: urinary urgency and diabetes.  Urinary urgency: Has been ongoing for a few weeks, but notes frequency since menopause.  Has not been medicated for this in the past. Interested in options She denies dysuria, hematuria, flank pain, fevers, chills, sweats, fatigue. Chart review shows instances of urinary incontinence dating back 10 years or so.   Diabetes Her chart lists both type 1 and type 2 - she is unsure what she has She states she was first diagnosed at some point in her 44s.  She had been managed through [facility] but is looking to est through [facility] Chart review shows A1c as of 5.4 on 11/28/2012 when she was apparently not on any medication for diabetes  [facility] - A1c at that time was 12.2, dx with t2dm No apparent beta cell ab testing on chart review. She has been hospitalized multiple times due to DKA with sugars 1000+. She does continue on tresiba 25 units daily. Poor compliance.  She has been on doses of novolog via insulin pump but unclear on compliance and her knowledge of the importance of sliding scale dosing.  Hx of ckd assoc with t2dm, last few GFRs seem to be above 60 but has not been checked in around 1 year.  Past Medical History:  Diagnosis Date   Allergy    Depression    Diabetes mellitus without complication (HCC)    Hyperlipidemia    Hypertension    Mixed stress and urge urinary incontinence     Osteoporosis    Pneumonia    Schizophrenia (HCC)    Stroke [facility])     History reviewed. No pertinent surgical history.  Family History  Problem Relation Age of Onset   Heart disease Mother    Hypertension Father    Stroke Father    Heart disease Father    Diabetes Father    Emphysema Paternal Grandfather     Social History   Socioeconomic History   Marital status: Single    Spouse name: Not on file   Number of children: Not on file   Years of education: Not on file   Highest education level: Not on file  Occupational History   Not on file  Tobacco Use   Smoking status: Former    Packs/day: 3.00    Types: Cigarettes   Smokeless tobacco: Never  Substance and Sexual Activity   Alcohol use: Yes    Alcohol/week: 6.0 standard drinks    Types: 6 Cans of beer per week   Drug use: No   Sexual activity: Not on file  Other Topics Concern   Not on file  Social History Narrative   Not on file   Social Determinants of Health   Financial Resource Strain: Not on file  Food Insecurity: Not on file  Transportation Needs: Not on file  Physical Activity: Not on file  Stress: Not on file  Social Connections: Not on file  Intimate Partner Violence: Not on file    Outpatient Medications Prior to Visit  Medication Sig Dispense Refill   ARIPiprazole (ABILIFY) 30 MG tablet Take 30 mg by mouth at bedtime.     aspirin 81 MG EC tablet Take 81 mg by mouth at bedtime.     atorvastatin (LIPITOR) 20 MG tablet Take 20 mg by mouth daily.     benztropine (COGENTIN) 1 MG tablet Take 1 mg by mouth at bedtime.      buPROPion (WELLBUTRIN) 75 MG tablet Take 75 mg by mouth 2 (two) times daily.     insulin aspart (NOVOLOG) 100 UNIT/ML injection Inject three times daily with meals.  Correction coverage: Moderate (average weight, post-op) CBG < 70: implement hypoglycemia protocol CBG 70 - 120: 0 units CBG 121 - 150: 2 units CBG 151 - 200: 3 units CBG 201 - 250: 5 units CBG 251 - 300: 8  units CBG 301 - 350: 11 units CBG 351 - 400: 15 units CBG > 400: call MD and obtain STAT lab verification (Patient taking differently: Inject into the skin continuous. VIa insulin pum   p Inject three times daily with meals.  Correction coverage: Moderate (average weight, post-op) CBG < 70: implement hypoglycemia protocol CBG 70 - 120: 0 units CBG 121 - 150: 2 units CBG 151 - 200: 3 units CBG 201 - 250: 5 units CBG 251 - 300: 8 units CBG 301 - 350: 11 units CBG 351 - 400: 15 units CBG > 400: call MD) 10 mL    metoprolol succinate (TOPROL-XL) 25 MG 24 hr tablet Take 12.5 mg by mouth daily.      mirtazapine (REMERON) 30 MG tablet Take 30 mg by mouth at bedtime.      potassium chloride (KLOR-CON) 10 MEQ tablet Take 2 tablets (20 mEq total) by mouth daily for 15 days. 30 tablet 0   OYSCO 500 500 MG TABS Take 1 tablet by mouth daily.      TRESIBA FLEXTOUCH 100 UNIT/ML FlexTouch Pen Inject 25 Units into the skin daily.     No facility-administered medications prior to visit.    Allergies  Allergen Reactions   Penicillins Rash    Did it involve swelling of the face/tongue/throat, SOB, or low BP? Unknown Did it involve sudden or severe rash/hives, skin peeling, or any reaction on the inside of your mouth or nose? Unknown Did you need to seek medical attention at a hospital or doctor's office? Unknown When did it last happen?       If all above answers are "NO", may proceed with cephalosporin use.     ROS Review of Systems  Constitutional: Negative.   HENT: Negative.    Eyes: Negative.   Respiratory: Negative.    Cardiovascular: Negative.   Gastrointestinal: Negative.   Genitourinary: Negative.   Musculoskeletal: Negative.   Skin: Negative.   Neurological: Negative.   Psychiatric/Behavioral: Negative.    All other systems reviewed and are negative.    Objective:    Physical Exam Vitals and nursing note reviewed.  Constitutional:      General: She is not in acute  distress.    Appearance: Normal appearance. She is normal weight. She is not ill-appearing, toxic-appearing or diaphoretic.  Cardiovascular:     Rate and Rhythm: Normal rate and regular rhythm.     Heart sounds: Normal heart sounds. No murmur heard.   No friction rub. No gallop.  Pulmonary:     Effort: Pulmonary effort is normal. No respiratory distress.     Breath sounds: Normal breath sounds. No stridor. No wheezing, rhonchi or rales.  Chest:     Chest wall: No tenderness.  Skin:    General: Skin is warm and dry.  Neurological:     General: No focal deficit present.     Mental Status: She is alert and oriented to person, place, and time. Mental status is at baseline.  Psychiatric:        Mood and Affect: Mood normal.        Behavior: Behavior normal.        Thought Content: Thought content normal.        Judgment: Judgment normal.    BP 124/70    Pulse 81    Temp 98 F (36.7 C) (Temporal)    Wt 143 lb 6.4 oz (65 kg)    SpO2 99%    BMI 24.61 kg/m  Wt Readings from Last 3 Encounters:  01/03/22 143 lb 6.4 oz (65 kg)  11/28/20 158 lb 8.2 oz (71.9 kg)  12/04/19 140 lb (63.5 kg)     Health Maintenance Due  Topic Date Due   Pneumonia Vaccine 71+ Years old (1 - PCV) Never done   FOOT EXAM  Never done   OPHTHALMOLOGY EXAM  Never done   URINE MICROALBUMIN  Never done   Hepatitis C Screening  Never done   TETANUS/TDAP  Never done   COLONOSCOPY (Pts 45-26yrs Insurance coverage will need to be confirmed)  Never done   MAMMOGRAM  07/21/2017    There are no preventive care reminders to display for this patient.  Lab Results  Component Value Date   TSH 2.38 01/03/2022   Lab Results  Component Value Date   WBC 4.6 01/03/2022   HGB 13.6 01/03/2022   HCT 41.1 01/03/2022   MCV 79.6 01/03/2022   PLT 164.0 01/03/2022   Lab Results  Component Value Date   NA 131 (L) 01/03/2022   K 4.4 01/03/2022   CO2 28 01/03/2022   GLUCOSE 590 (HH) 01/03/2022   BUN 25 (H) 01/03/2022    CREATININE 1.13 01/03/2022   BILITOT 0.5 01/03/2022   ALKPHOS 120 (H) 01/03/2022   AST 12 01/03/2022   ALT 12 01/03/2022   PROT 7.5 01/03/2022   ALBUMIN 4.5 01/03/2022   CALCIUM 9.8 01/03/2022   ANIONGAP 7 11/29/2020   GFR 49.91 (L) 01/03/2022   Lab Results  Component Value Date   CHOL 173 01/03/2022   Lab Results  Component Value Date   HDL 50.80 01/03/2022   Lab Results  Component Value Date   LDLCALC 77 10/12/2014   Lab Results  Component Value Date   TRIG 279.0 (H) 01/03/2022   Lab Results  Component Value Date   CHOLHDL 3 01/03/2022   Lab Results  Component Value Date   HGBA1C 12.1 (H) 01/03/2022      Assessment & Plan:   Problem List Items Addressed This Visit       Endocrine   Diabetes ([place]) - Primary   Relevant Medications   TRESIBA FLEXTOUCH 100 UNIT/ML FlexTouch Pen   Other Relevant Orders   CBC with Differential/Platelet (Completed)   Comprehensive metabolic panel (Completed)   Hemoglobin A1c (Completed)   Lipid panel (Completed)   TSH (Completed)   Urinalysis, Routine w reflex microscopic   Other Visit Diagnoses     Estrogen deficiency       Relevant  Medications   OYSCO 500 500 MG TABS   Other Relevant Orders   Lipid panel (Completed)       Meds ordered this encounter  Medications   OYSCO 500 500 MG TABS    Sig: Take 1 tablet (1,250 mg total) by mouth daily.    Dispense:  90 tablet    Refill:  1   TRESIBA FLEXTOUCH 100 UNIT/ML FlexTouch Pen    Sig: Inject 30 Units into the skin daily.    Dispense:  27 mL    Refill:  0    Order Specific Question:   Supervising Provider    Answer:   Carlota Raspberry, JEFFREY R [id number]    Follow-up: Return in about 6 weeks (around 02/14/2022) for 6-8 weeks for med check.   PLAN Increase tresiba to 30 units daily.  Close follow up after labs return. Consider mirabegron vs. Oxybutinin for urinary symptoms, but would prefer to monitor improvement with better control of sugars.  Extensive time spent in  chart review - over 1 hour Discussed diabetes disease process and importance of medication compliance, potential complications, and need for close follow up to manage disease. Patient encouraged to call clinic with any questions, comments, or concerns.  Maximiano Coss, NP

## 2022-01-03 NOTE — Patient Instructions (Signed)
Ms. Enis Leatherwood to meet you!  I'll let you know how labs look  I'll try to get glucose monitor covered  See you in 6-8 weeks for check on things  Call sooner if you need anything!  Thanks,  Luan Pulling

## 2022-01-04 LAB — CBC WITH DIFFERENTIAL/PLATELET
Basophils Absolute: 0 10*3/uL (ref 0.0–0.1)
Basophils Relative: 0.4 % (ref 0.0–3.0)
Eosinophils Absolute: 0.1 10*3/uL (ref 0.0–0.7)
Eosinophils Relative: 2 % (ref 0.0–5.0)
HCT: 41.1 % (ref 36.0–46.0)
Hemoglobin: 13.6 g/dL (ref 12.0–15.0)
Lymphocytes Relative: 26.3 % (ref 12.0–46.0)
Lymphs Abs: 1.2 10*3/uL (ref 0.7–4.0)
MCHC: 33.1 g/dL (ref 30.0–36.0)
MCV: 79.6 fl (ref 78.0–100.0)
Monocytes Absolute: 0.2 10*3/uL (ref 0.1–1.0)
Monocytes Relative: 4.7 % (ref 3.0–12.0)
Neutro Abs: 3.1 10*3/uL (ref 1.4–7.7)
Neutrophils Relative %: 66.6 % (ref 43.0–77.0)
Platelets: 164 10*3/uL (ref 150.0–400.0)
RBC: 5.17 Mil/uL — ABNORMAL HIGH (ref 3.87–5.11)
RDW: 15.1 % (ref 11.5–15.5)
WBC: 4.6 10*3/uL (ref 4.0–10.5)

## 2022-01-04 LAB — TSH: TSH: 2.38 u[IU]/mL (ref 0.35–5.50)

## 2022-01-04 LAB — COMPREHENSIVE METABOLIC PANEL
ALT: 12 U/L (ref 0–35)
AST: 12 U/L (ref 0–37)
Albumin: 4.5 g/dL (ref 3.5–5.2)
Alkaline Phosphatase: 120 U/L — ABNORMAL HIGH (ref 39–117)
BUN: 25 mg/dL — ABNORMAL HIGH (ref 6–23)
CO2: 28 mEq/L (ref 19–32)
Calcium: 9.8 mg/dL (ref 8.4–10.5)
Chloride: 96 mEq/L (ref 96–112)
Creatinine, Ser: 1.13 mg/dL (ref 0.40–1.20)
GFR: 49.91 mL/min — ABNORMAL LOW (ref >60.00)
Glucose, Bld: 590 mg/dL (ref 70–99)
Potassium: 4.4 mEq/L (ref 3.5–5.1)
Sodium: 131 mEq/L — ABNORMAL LOW (ref 135–145)
Total Bilirubin: 0.5 mg/dL (ref 0.2–1.2)
Total Protein: 7.5 g/dL (ref 6.0–8.3)

## 2022-01-04 LAB — LDL CHOLESTEROL, DIRECT: Direct LDL: 93 mg/dL

## 2022-01-04 LAB — HEMOGLOBIN A1C: Hgb A1c MFr Bld: 12.1 % — ABNORMAL HIGH (ref 4.6–6.5)

## 2022-01-04 LAB — LIPID PANEL
Cholesterol: 173 mg/dL (ref 0–200)
HDL: 50.8 mg/dL (ref >39.00)
NonHDL: 121.9
Total CHOL/HDL Ratio: 3
Triglycerides: 279 mg/dL — ABNORMAL HIGH (ref 0.0–149.0)
VLDL: 55.8 mg/dL — ABNORMAL HIGH (ref 0.0–40.0)

## 2022-01-06 MED ORDER — TRESIBA FLEXTOUCH 100 UNIT/ML ~~LOC~~ SOPN: 30.0000 [IU] | PEN_INJECTOR | Freq: Every day | SUBCUTANEOUS | 0 refills | Status: DC

## 2022-01-09 ENCOUNTER — Other Ambulatory Visit: Payer: Self-pay

## 2022-01-09 MED ORDER — METOPROLOL SUCCINATE ER 25 MG PO TB24: 12.5000 mg | ORAL_TABLET | Freq: Every day | ORAL | 1 refills | Status: DC

## 2022-01-09 NOTE — Telephone Encounter (Signed)
Attempted call to pt no answer and unable to LM

## 2022-01-09 NOTE — Telephone Encounter (Signed)
Encourage patient to contact the pharmacy for refills or they can request refills through [facility]  (Please schedule appointment if patient has not been seen in over a year)    WHAT PHARMACY WOULD THEY LIKE THIS SENT TO: [id number] - [place], [place] - [address]   MEDICATION NAME & DOSE:metoprolol succinate (TOPROL-XL) 25 MG 24 hr tablet   NOTES/COMMENTS FROM PATIENT:      [place] office please notify patient: It takes 48-72 hours to process rx refill requests Ask patient to call pharmacy to ensure rx is ready before heading there.

## 2022-01-09 NOTE — Telephone Encounter (Signed)
Pt is asking that you prescribe this. Not previously filled under your name.   Please advise

## 2022-02-20 ENCOUNTER — Ambulatory Visit: Payer: Medicare Other | Admitting: Registered Nurse

## 2022-02-28 ENCOUNTER — Ambulatory Visit: Payer: Medicare Other | Admitting: Registered Nurse

## 2022-03-01 ENCOUNTER — Other Ambulatory Visit: Payer: Self-pay | Admitting: Registered Nurse

## 2022-03-01 ENCOUNTER — Encounter: Payer: Self-pay | Admitting: Registered Nurse

## 2022-03-01 ENCOUNTER — Ambulatory Visit (INDEPENDENT_AMBULATORY_CARE_PROVIDER_SITE_OTHER): Payer: Medicare Other | Admitting: Registered Nurse

## 2022-03-01 VITALS — BP 103/86 | HR 96 | Temp 98.1°F | Resp 18 | Ht 64.0 in | Wt 139.6 lb

## 2022-03-01 DIAGNOSIS — R32 Unspecified urinary incontinence: Secondary | ICD-10-CM | POA: Diagnosis not present

## 2022-03-01 DIAGNOSIS — E1122 Type 2 diabetes mellitus with diabetic chronic kidney disease: Secondary | ICD-10-CM

## 2022-03-01 DIAGNOSIS — Z794 Long term (current) use of insulin: Secondary | ICD-10-CM | POA: Diagnosis not present

## 2022-03-01 LAB — URINALYSIS, ROUTINE W REFLEX MICROSCOPIC
Bilirubin Urine: NEGATIVE
Ketones, ur: 15 — AB
Nitrite: NEGATIVE
Specific Gravity, Urine: 1.015 (ref 1.000–1.030)
Total Protein, Urine: NEGATIVE
Urine Glucose: >=1000 — AB
Urobilinogen, UA: 0.2 (ref 0.0–1.0)
pH: 5.5 (ref 5.0–8.0)

## 2022-03-01 MED ORDER — DEXCOM G6 SENSOR MISC: Status: DC

## 2022-03-01 MED ORDER — FREESTYLE LIBRE 2 SENSOR MISC: 1.0000 | 1 refills | Status: DC

## 2022-03-01 MED ORDER — FREESTYLE LIBRE 2 READER DEVI: 1.0000 | Freq: Once | 0 refills | Status: AC

## 2022-03-01 MED ORDER — DEXCOM G6 RECEIVER DEVI: 1.0000 | Freq: Once | 0 refills | Status: DC

## 2022-03-01 MED ORDER — DEXCOM G6 TRANSMITTER MISC: 1.0000 | 3 refills | Status: DC

## 2022-03-01 MED ORDER — DEXCOM G6 SENSOR MISC: 3 refills | Status: DC

## 2022-03-01 NOTE — Patient Instructions (Addendum)
[name] -  ? ?Great to see you! ? ?MyChart Username: [email or web address] ?Password: [email or web address] ? ? ?Continue to watch sugar intake ?I will send dexcom ? ?I have referred to Urology - they will call you to set up an appointment ? ?Thank you, ? ?Rich  ? ? ?If you have lab work done today you will be contacted with your lab results within the next 2 weeks.  If you have not heard from [place] then please contact us. The fastest way to get your results is to register for My Chart. ? ? ?IF you received an x-ray today, you will receive an invoice from [facility]. Please contact [facility] at [phone] with questions or concerns regarding your invoice.  ? ?IF you received labwork today, you will receive an invoice from [employer]. Please contact [facility] at [phone] with questions or concerns regarding your invoice.  ? ?Our billing staff will not be able to assist you with questions regarding bills from these companies. ? ?You will be contacted with the lab results as soon as they are available. The fastest way to get your results is to activate your My Chart account. Instructions are located on the last page of this paperwork. If you have not heard from [place] regarding the results in 2 weeks, please contact this office. ?  ? ? ?

## 2022-03-01 NOTE — Progress Notes (Signed)
? ?Established Patient Office Visit ? ?Subjective:  ?Patient ID: [name], female    DOB: 08-12-1951  Age: 71 y.o. MRN: [id number] ? ?CC:  ?Chief Complaint  ?Patient presents with  ? Follow-up  ?  Patient states she is here for a 6 week follow up  ? ? ?HPI ?[name] presents for t2dm ? ?Last A1c:  ?Lab Results  ?Component Value Date  ? HGBA1C 12.1 (H) 01/03/2022  ?  ?Currently taking: tresiba 30 units daily, novolog sliding scale tid. ?No new complications ?Reports good compliance with medications ?Diet has been steady ?Exercise habits have been steady ? ?Incontinence ?Varies from leaking to full incontinence ?Happens during day and at night. ?She is aware of this and feels urge ?No fecal incontinence ?Has been going on for quite some time but worsening ?No dysuria, hematuria, or visible organ prolapse. ? ?Outpatient Medications Prior to Visit  ?Medication Sig Dispense Refill  ? ARIPiprazole (ABILIFY) 30 MG tablet Take 30 mg by mouth at bedtime.    ? aspirin 81 MG EC tablet Take 81 mg by mouth at bedtime.    ? atorvastatin (LIPITOR) 20 MG tablet Take 20 mg by mouth daily.    ? benztropine (COGENTIN) 1 MG tablet Take 1 mg by mouth at bedtime.     ? buPROPion (WELLBUTRIN) 75 MG tablet Take 75 mg by mouth 2 (two) times daily.    ? insulin aspart (NOVOLOG) 100 UNIT/ML injection Inject three times daily with meals.  ?Correction coverage: Moderate (average weight, post-op) ?CBG < 70: implement hypoglycemia protocol ?CBG 70 - 120: 0 units ?CBG 121 - 150: 2 units ?CBG 151 - 200: 3 units ?CBG 201 - 250: 5 units ?CBG 251 - 300: 8 units ?CBG 301 - 350: 11 units ?CBG 351 - 400: 15 units ?CBG > 400: call MD and obtain STAT lab verification (Patient taking differently: Inject into the skin continuous. VIa insulin pum ? ? ?p ?Inject three times daily with meals.  ?Correction coverage: Moderate (average weight, post-op) ?CBG < 70: implement hypoglycemia protocol ?CBG 70 - 120: 0 units ?CBG 121 - 150: 2  units ?CBG 151 - 200: 3 units ?CBG 201 - 250: 5 units ?CBG 251 - 300: 8 units ?CBG 301 - 350: 11 units ?CBG 351 - 400: 15 units ?CBG > 400: call MD) 10 mL   ? metoprolol succinate (TOPROL-XL) 25 MG 24 hr tablet Take 0.5 tablets (12.5 mg total) by mouth daily. 90 tablet 1  ? mirtazapine (REMERON) 30 MG tablet Take 30 mg by mouth at bedtime.     ? OYSCO 500 500 MG TABS Take 1 tablet (1,250 mg total) by mouth daily. 90 tablet 1  ? potassium chloride (KLOR-CON) 10 MEQ tablet Take 2 tablets (20 mEq total) by mouth daily for 15 days. 30 tablet 0  ? TRESIBA FLEXTOUCH 100 UNIT/ML FlexTouch Pen Inject 30 Units into the skin daily. 27 mL 0  ? ?No facility-administered medications prior to visit.  ? ? ?Review of Systems  ?Constitutional: Negative.   ?HENT: Negative.    ?Eyes: Negative.   ?Respiratory: Negative.    ?Cardiovascular: Negative.   ?Gastrointestinal: Negative.   ?Endocrine: Negative.   ?Genitourinary: Negative.   ?Musculoskeletal: Negative.   ?Skin: Negative.   ?Allergic/Immunologic: Negative.   ?Neurological: Negative.   ?Hematological: Negative.   ?Psychiatric/Behavioral: Negative.    ? ?  ?Objective:  ?  ? ?BP 103/86   Pulse 96   Temp 98.1 ?F (36.7 ?C) (Temporal)  Resp 18   Ht 5\' 4"  (1.626 m)   Wt 139 lb 9.6 oz (63.3 kg)   SpO2 97%   BMI 23.96 kg/m?  ? ?Wt Readings from Last 3 Encounters:  ?03/01/22 139 lb 9.6 oz (63.3 kg)  ?01/03/22 143 lb 6.4 oz (65 kg)  ?11/28/20 158 lb 8.2 oz (71.9 kg)  ? ?Physical Exam ?Vitals and nursing note reviewed.  ?Constitutional:   ?   General: She is not in acute distress. ?   Appearance: Normal appearance. She is normal weight. She is not ill-appearing, toxic-appearing or diaphoretic.  ?Cardiovascular:  ?   Rate and Rhythm: Normal rate and regular rhythm.  ?   Heart sounds: Normal heart sounds. No murmur heard. ?  No friction rub. No gallop.  ?Pulmonary:  ?   Effort: Pulmonary effort is normal. No respiratory distress.  ?   Breath sounds: Normal breath sounds. No stridor.  No wheezing, rhonchi or rales.  ?Chest:  ?   Chest wall: No tenderness.  ?Skin: ?   General: Skin is warm and dry.  ?Neurological:  ?   General: No focal deficit present.  ?   Mental Status: She is alert and oriented to person, place, and time. Mental status is at baseline.  ?Psychiatric:     ?   Mood and Affect: Mood normal.     ?   Behavior: Behavior normal.     ?   Thought Content: Thought content normal.     ?   Judgment: Judgment normal.  ? ? ?No results found for any visits on 03/01/22. ? ? ? ?The 10-year ASCVD risk score (Arnett DK, et al., 2019) is: 15.5% ? ?  ?Assessment & Plan:  ? ?Problem List Items Addressed This Visit   ? ?  ? Endocrine  ? Diabetes ([place])  ? Relevant Medications  ? Continuous Blood Gluc Receiver (FREESTYLE LIBRE 2 READER) DEVI  ? Continuous Blood Gluc Sensor (FREESTYLE LIBRE 2 SENSOR) MISC  ? ?Other Visit Diagnoses   ? ? Urinary incontinence, unspecified type    -  Primary  ? Relevant Orders  ? Urinalysis, Routine w reflex microscopic  ? Urine Culture  ? Ambulatory referral to Urology  ? ?  ? ? ?Meds ordered this encounter  ?Medications  ? DISCONTD: Continuous Blood Gluc Transmit (DEXCOM G6 TRANSMITTER) MISC  ?  Sig: 1 each by Does not apply route every 3 (three) months.  ?  Dispense:  1 each  ?  Refill:  3  ?  Order Specific Question:   Supervising Provider  ?  Answer:   Carlota Raspberry, JEFFREY R [[id number]]  ? DISCONTD: Continuous Blood Gluc Receiver ([place]) [place]  ?  Sig: 1 each by Does not apply route once for 1 dose.  ?  Dispense:  1 each  ?  Refill:  0  ?  Order Specific Question:   Supervising Provider  ?  Answer:   Carlota Raspberry, JEFFREY R [[id number]]  ? DISCONTD: Continuous Blood Gluc Sensor (DEXCOM G6 SENSOR) MISC  ?  Sig: Apply 1 to skin every 10 days as instructed.  ?  Dispense:  9 each  ?  Refill:  EACH  ?  Order Specific Question:   Supervising Provider  ?  Answer:   Carlota Raspberry, JEFFREY R [[id number]]  ? DISCONTD: Continuous Blood Gluc Sensor (DEXCOM G6 SENSOR) MISC  ?  Sig: Apply topically per  package instructions  ?  Dispense:  3 each  ?  Refill:  3  ?  Order Specific Question:   Supervising Provider  ?  Answer:   Carlota Raspberry, JEFFREY R [[id number]]  ? Continuous Blood Gluc Receiver (FREESTYLE LIBRE 2 READER) DEVI  ?  Sig: 1 each by Does not apply route once for 1 dose.  ?  Dispense:  1 each  ?  Refill:  0  ?  Order Specific Question:   Supervising Provider  ?  Answer:   Carlota Raspberry, JEFFREY R [[id number]]  ? Continuous Blood Gluc Sensor (FREESTYLE LIBRE 2 SENSOR) MISC  ?  Sig: 1 each by Does not apply route every 14 (fourteen) days.  ?  Dispense:  7 each  ?  Refill:  1  ?  Order Specific Question:   Supervising Provider  ?  Answer:   Carlota Raspberry, JEFFREY R [[id number]]  ? ? ?Return in about 6 weeks (around 04/12/2022) for Chronic Conditions.  ? ?PLAN ?Pt compliant with insulins. Will continue. Recheck a1c in 6 weeks. Will start constant glucose monitoring if covered, otherwise, will have her check with finger stick qid. ?Urinalysis and culture sent out. Follow up as indicated. Refer to urology for further work up. ?Return in 6 weeks ?Patient encouraged to call clinic with any questions, comments, or concerns. ? ?Maximiano Coss, NP ?

## 2022-03-03 LAB — URINE CULTURE
MICRO NUMBER:: 13263836
SPECIMEN QUALITY:: ADEQUATE

## 2022-03-08 ENCOUNTER — Other Ambulatory Visit: Payer: Self-pay | Admitting: Registered Nurse

## 2022-03-08 DIAGNOSIS — N3000 Acute cystitis without hematuria: Secondary | ICD-10-CM

## 2022-03-08 MED ORDER — SULFAMETHOXAZOLE-TRIMETHOPRIM 800-160 MG PO TABS: 1.0000 | ORAL_TABLET | Freq: Two times a day (BID) | ORAL | 0 refills | Status: DC

## 2022-03-12 ENCOUNTER — Other Ambulatory Visit: Payer: Self-pay | Admitting: Registered Nurse

## 2022-03-12 DIAGNOSIS — E1122 Type 2 diabetes mellitus with diabetic chronic kidney disease: Secondary | ICD-10-CM

## 2022-03-21 ENCOUNTER — Encounter: Payer: Self-pay | Admitting: Registered Nurse

## 2022-03-21 ENCOUNTER — Ambulatory Visit (INDEPENDENT_AMBULATORY_CARE_PROVIDER_SITE_OTHER): Payer: Medicare Other | Admitting: Registered Nurse

## 2022-03-21 VITALS — BP 106/76 | HR 79 | Temp 98.1°F | Resp 18 | Ht 64.0 in | Wt 140.0 lb

## 2022-03-21 DIAGNOSIS — E1122 Type 2 diabetes mellitus with diabetic chronic kidney disease: Secondary | ICD-10-CM | POA: Diagnosis not present

## 2022-03-21 DIAGNOSIS — Z1231 Encounter for screening mammogram for malignant neoplasm of breast: Secondary | ICD-10-CM

## 2022-03-21 DIAGNOSIS — J069 Acute upper respiratory infection, unspecified: Secondary | ICD-10-CM

## 2022-03-21 DIAGNOSIS — Z794 Long term (current) use of insulin: Secondary | ICD-10-CM

## 2022-03-21 DIAGNOSIS — Z1211 Encounter for screening for malignant neoplasm of colon: Secondary | ICD-10-CM | POA: Diagnosis not present

## 2022-03-21 DIAGNOSIS — N3000 Acute cystitis without hematuria: Secondary | ICD-10-CM

## 2022-03-21 MED ORDER — DOXYCYCLINE HYCLATE 100 MG PO TABS: 100.0000 mg | ORAL_TABLET | Freq: Two times a day (BID) | ORAL | 0 refills | Status: DC

## 2022-03-21 MED ORDER — PREDNISONE 20 MG PO TABS: 20.0000 mg | ORAL_TABLET | Freq: Every day | ORAL | 0 refills | Status: DC

## 2022-03-21 NOTE — Progress Notes (Signed)
? ?Acute Office Visit ? ?Subjective:  ? ? Patient ID: [name], female    DOB: October 25, 1951, 71 y.o.   MRN: [id number] ? ?Chief Complaint  ?Patient presents with  ? Nasal Congestion  ?  Patient states she has been having some congestion , sore throat for about 1 week  ? ? ?HPI ?Patient is in today for nasal congestion, sore throat ? ?Onset one week ago ?Worsening ?Feels like swallowing razors. ?Has taken mucinex with minimal relief. ? ?No nvd, shob, doe, chest pain, headaches.  ? ?Recent hx of UTI ?Wants to confirm eradication ?Will sent culture ? ?HM ?Due for mammo and colonoscopy, will place referrals ?Due for t2dm eye exam, referral placed. ? ?Outpatient Medications Prior to Visit  ?Medication Sig Dispense Refill  ? ARIPiprazole (ABILIFY) 30 MG tablet Take 30 mg by mouth at bedtime.    ? aspirin 81 MG EC tablet Take 81 mg by mouth at bedtime.    ? atorvastatin (LIPITOR) 20 MG tablet Take 20 mg by mouth daily.    ? benztropine (COGENTIN) 1 MG tablet Take 1 mg by mouth at bedtime.     ? buPROPion (WELLBUTRIN) 75 MG tablet Take 75 mg by mouth 2 (two) times daily.    ? Continuous Blood Gluc Sensor (FREESTYLE LIBRE 2 SENSOR) MISC 1 each by Does not apply route every 14 (fourteen) days. 7 each 1  ? insulin aspart (NOVOLOG) 100 UNIT/ML injection Inject three times daily with meals.  ?Correction coverage: Moderate (average weight, post-op) ?CBG < 70: implement hypoglycemia protocol ?CBG 70 - 120: 0 units ?CBG 121 - 150: 2 units ?CBG 151 - 200: 3 units ?CBG 201 - 250: 5 units ?CBG 251 - 300: 8 units ?CBG 301 - 350: 11 units ?CBG 351 - 400: 15 units ?CBG > 400: call MD and obtain STAT lab verification (Patient taking differently: Inject into the skin continuous. VIa insulin pum ? ? ?p ?Inject three times daily with meals.  ?Correction coverage: Moderate (average weight, post-op) ?CBG < 70: implement hypoglycemia protocol ?CBG 70 - 120: 0 units ?CBG 121 - 150: 2 units ?CBG 151 - 200: 3 units ?CBG 201 - 250: 5  units ?CBG 251 - 300: 8 units ?CBG 301 - 350: 11 units ?CBG 351 - 400: 15 units ?CBG > 400: call MD) 10 mL   ? metoprolol succinate (TOPROL-XL) 25 MG 24 hr tablet Take 0.5 tablets (12.5 mg total) by mouth daily. 90 tablet 1  ? mirtazapine (REMERON) 30 MG tablet Take 30 mg by mouth at bedtime.     ? OYSCO 500 500 MG TABS Take 1 tablet (1,250 mg total) by mouth daily. 90 tablet 1  ? sulfamethoxazole-trimethoprim (BACTRIM DS) 800-160 MG tablet Take 1 tablet by mouth 2 (two) times daily. 14 tablet 0  ? TRESIBA FLEXTOUCH 100 UNIT/ML FlexTouch Pen Inject 30 Units into the skin daily. 27 mL 0  ? potassium chloride (KLOR-CON) 10 MEQ tablet Take 2 tablets (20 mEq total) by mouth daily for 15 days. 30 tablet 0  ? ?No facility-administered medications prior to visit.  ? ? ?Review of Systems  ?Constitutional: Negative.   ?HENT:  Positive for congestion, sinus pressure and sore throat.   ?Eyes: Negative.   ?Respiratory: Negative.    ?Cardiovascular: Negative.   ?Gastrointestinal: Negative.   ?Endocrine: Negative.   ?Genitourinary: Negative.   ?Musculoskeletal: Negative.   ?Skin: Negative.   ?Allergic/Immunologic: Negative.   ?Neurological: Negative.   ?Hematological: Negative.   ?Psychiatric/Behavioral: Negative.    ?  All other systems reviewed and are negative. ? ?   ?Objective:  ?  ?BP 106/76   Pulse 79   Temp 98.1 ?F (36.7 ?C) (Temporal)   Resp 18   Ht 5\' 4"  (1.626 m)   Wt 140 lb (63.5 kg)   SpO2 100%   BMI 24.03 kg/m?  ?Physical Exam ?Vitals and nursing note reviewed.  ?Constitutional:   ?   General: She is not in acute distress. ?   Appearance: Normal appearance. She is normal weight. She is not ill-appearing, toxic-appearing or diaphoretic.  ?HENT:  ?   Nose: Congestion and rhinorrhea present.  ?   Mouth/Throat:  ?   Pharynx: Posterior oropharyngeal erythema present.  ?Cardiovascular:  ?   Rate and Rhythm: Normal rate and regular rhythm.  ?   Heart sounds: Normal heart sounds. No murmur heard. ?  No friction rub. No  gallop.  ?Pulmonary:  ?   Effort: Pulmonary effort is normal. No respiratory distress.  ?   Breath sounds: Normal breath sounds. No stridor. No wheezing, rhonchi or rales.  ?Chest:  ?   Chest wall: No tenderness.  ?Skin: ?   General: Skin is warm and dry.  ?Neurological:  ?   General: No focal deficit present.  ?   Mental Status: She is alert and oriented to person, place, and time. Mental status is at baseline.  ?Psychiatric:     ?   Mood and Affect: Mood normal.     ?   Behavior: Behavior normal.     ?   Thought Content: Thought content normal.     ?   Judgment: Judgment normal.  ? ? ?No results found for any visits on 03/21/22. ? ? ?   ?Assessment & Plan:  ?1. Upper respiratory tract infection, unspecified type ?- doxycycline (VIBRA-TABS) 100 MG tablet; Take 1 tablet (100 mg total) by mouth 2 (two) times daily.  Dispense: 20 tablet; Refill: 0 ?- predniSONE (DELTASONE) 20 MG tablet; Take 1 tablet (20 mg total) by mouth daily with breakfast.  Dispense: 5 tablet; Refill: 0 ? ?2. Type 2 diabetes mellitus with chronic kidney disease, with long-term current use of insulin, unspecified CKD stage ([place]) ?- Ambulatory referral to Ophthalmology ? ?3. Screening mammogram for breast cancer ?- MM Digital Screening; Future ? ?4. Screen for colon cancer ?- Ambulatory referral to Gastroenterology ? ?5. Acute cystitis without hematuria ?- Urine Culture ? ? ? ?Meds ordered this encounter  ?Medications  ? doxycycline (VIBRA-TABS) 100 MG tablet  ?  Sig: Take 1 tablet (100 mg total) by mouth 2 (two) times daily.  ?  Dispense:  20 tablet  ?  Refill:  0  ?  Order Specific Question:   Supervising Provider  ?  Answer:   Carlota Raspberry, JEFFREY R [[id number]]  ? predniSONE (DELTASONE) 20 MG tablet  ?  Sig: Take 1 tablet (20 mg total) by mouth daily with breakfast.  ?  Dispense:  5 tablet  ?  Refill:  0  ?  Order Specific Question:   Supervising Provider  ?  Answer:   Carlota Raspberry, JEFFREY R [[id number]]  ? ? ?Return if symptoms worsen or fail to improve, for as  scheduled. ? ?PLAN ?Doxycycline and prednisone for URI. Reviewed risks, benefits, and side effects, pt voices understanding. ?Repeat urine culture to confirm eradication. She will keep appt with Dr. Jeffie Pollock on 04/04/22 ?Refer to colonoscopy, mammo, t2dm eye exam ?Patient encouraged to call clinic with any questions, comments, or concerns. ? ?Tawan Corkern  Orland Mustard, NP ?

## 2022-03-21 NOTE — Patient Instructions (Addendum)
Ms. Wilbourn -  ? ?Great to see you!  ? ?Sorry that you're not feeling well.  ? ?Doxycycline twice daily x 10 days should help. Prednisone should dry things out quickly. ? ?I have placed orders for routine mammogram, colon cancer screening, and diabetic eye exam - get these done at your convenience ? ?Call if you're not feeling better by this time next week ? ?See you in a few weeks! ? ?Thanks, ? ?Rich  ? ? ? ?If you have lab work done today you will be contacted with your lab results within the next 2 weeks.  If you have not heard from [place] then please contact us. The fastest way to get your results is to register for My Chart. ? ? ?IF you received an x-ray today, you will receive an invoice from [facility]. Please contact [facility] at [phone] with questions or concerns regarding your invoice.  ? ?IF you received labwork today, you will receive an invoice from [facility]. Please contact [facility] at [phone] with questions or concerns regarding your invoice.  ? ?Our billing staff will not be able to assist you with questions regarding bills from these companies. ? ?You will be contacted with the lab results as soon as they are available. The fastest way to get your results is to activate your My Chart account. Instructions are located on the last page of this paperwork. If you have not heard from [place] regarding the results in 2 weeks, please contact this office. ?  ? ? ?

## 2022-03-25 ENCOUNTER — Other Ambulatory Visit: Payer: Self-pay | Admitting: Registered Nurse

## 2022-03-25 DIAGNOSIS — E1122 Type 2 diabetes mellitus with diabetic chronic kidney disease: Secondary | ICD-10-CM

## 2022-04-12 ENCOUNTER — Ambulatory Visit (INDEPENDENT_AMBULATORY_CARE_PROVIDER_SITE_OTHER): Payer: Medicare Other | Admitting: Registered Nurse

## 2022-04-12 ENCOUNTER — Encounter: Payer: Self-pay | Admitting: Registered Nurse

## 2022-04-12 VITALS — BP 108/68 | HR 67 | Temp 98.1°F | Resp 16 | Ht 64.0 in | Wt 138.4 lb

## 2022-04-12 DIAGNOSIS — Z794 Long term (current) use of insulin: Secondary | ICD-10-CM | POA: Diagnosis not present

## 2022-04-12 DIAGNOSIS — E1122 Type 2 diabetes mellitus with diabetic chronic kidney disease: Secondary | ICD-10-CM

## 2022-04-12 LAB — POCT GLYCOSYLATED HEMOGLOBIN (HGB A1C): Hemoglobin A1C: 12.6 % — AB (ref 4.0–5.6)

## 2022-04-12 MED ORDER — INSULIN ASPART 100 UNIT/ML IJ SOLN: 6.0000 [IU] | Freq: Three times a day (TID) | INTRAMUSCULAR | 0 refills | Status: DC

## 2022-04-12 MED ORDER — TRESIBA FLEXTOUCH 100 UNIT/ML ~~LOC~~ SOPN
PEN_INJECTOR | SUBCUTANEOUS | 0 refills | Status: DC
Start: 2022-04-12 — End: 2022-05-03

## 2022-04-12 NOTE — Assessment & Plan Note (Signed)
Add novolog 6 units tid ac. Increase tresiba to 34 units nightly for one mo then to 38 units nightly if fasting glucose above goal of <150.  Return in 3 mo for recheck Patient encouraged to call clinic with any questions, comments, or concerns.

## 2022-04-12 NOTE — Patient Instructions (Signed)
[name] to see you!  Increase the nightly insulin [name], degludec) to 34 units Add insulin before each meal (novolog, aspart) 6 units.   If sugars are still high after 1 mo, increase the tresiba/degludec to 38 units  See you in 3 months to recheck on things!  Call sooner if you have concerns!  Thanks,  Luan Pulling

## 2022-04-12 NOTE — Progress Notes (Signed)
Established Patient Office Visit  Subjective:  Patient ID: [name], female    DOB: [birth year]  Age: 71 y.o. MRN: [id number]  CC:  Chief Complaint  Patient presents with   Follow-up    F/U Diabetes    Diabetes    States her diabetes is not controlled     HPI [name] presents for t2dm  Last A1c:  Lab Results  Component Value Date   HGBA1C 12.6 (A) 04/12/2022    Currently taking: tresiba 30 units nightly No new complications Reports good compliance with medications Diet has been steady Exercise habits have been steady   Outpatient Medications Prior to Visit  Medication Sig Dispense Refill   ARIPiprazole (ABILIFY) 30 MG tablet Take 30 mg by mouth at bedtime.     aspirin 81 MG EC tablet Take 81 mg by mouth at bedtime.     atorvastatin (LIPITOR) 20 MG tablet Take 20 mg by mouth daily.     atorvastatin (LIPITOR) 20 MG tablet Take 1 tablet by mouth daily.     benztropine (COGENTIN) 1 MG tablet Take 1 mg by mouth at bedtime.      Blood Glucose Monitoring Suppl (ONETOUCH VERIO) w/Device KIT by Does not apply route.     buPROPion (WELLBUTRIN) 75 MG tablet Take 75 mg by mouth 2 (two) times daily.     Cholecalciferol 50 MCG (2000 UT) CAPS Take by mouth.     Continuous Blood Gluc Receiver (DEXCOM G6 RECEIVER) DEVI Use as directed for continuous glucose monitoring.     Continuous Blood Gluc Sensor (FREESTYLE LIBRE 2 SENSOR) MISC 1 each by Does not apply route every 14 (fourteen) days. 7 each 1   haloperidol (HALDOL) 1 MG tablet Take 1 mg by mouth daily.     hydrOXYzine (ATARAX) 25 MG tablet Take 25-50 mg by mouth at bedtime as needed.     metoprolol succinate (TOPROL-XL) 25 MG 24 hr tablet Take 0.5 tablets (12.5 mg total) by mouth daily. 90 tablet 1   mirtazapine (REMERON) 30 MG tablet Take 30 mg by mouth at bedtime.      OYSCO 500 500 MG TABS Take 1 tablet (1,250 mg total) by mouth daily. 90 tablet 1   insulin aspart (NOVOLOG) 100 UNIT/ML injection Inject  three times daily with meals.  Correction coverage: Moderate (average weight, post-op) CBG < 70: implement hypoglycemia protocol CBG 70 - 120: 0 units CBG 121 - 150: 2 units CBG 151 - 200: 3 units CBG 201 - 250: 5 units CBG 251 - 300: 8 units CBG 301 - 350: 11 units CBG 351 - 400: 15 units CBG > 400: call MD and obtain STAT lab verification (Patient taking differently: Inject into the skin continuous. VIa insulin pum   p Inject three times daily with meals.  Correction coverage: Moderate (average weight, post-op) CBG < 70: implement hypoglycemia protocol CBG 70 - 120: 0 units CBG 121 - 150: 2 units CBG 151 - 200: 3 units CBG 201 - 250: 5 units CBG 251 - 300: 8 units CBG 301 - 350: 11 units CBG 351 - 400: 15 units CBG > 400: call MD) 10 mL    TRESIBA FLEXTOUCH 100 UNIT/ML FlexTouch Pen INJECT 30 UNITS INTO THE SKIN DAILY 27 mL 0   potassium chloride (KLOR-CON) 10 MEQ tablet Take 2 tablets (20 mEq total) by mouth daily for 15 days. 30 tablet 0   doxycycline (VIBRA-TABS) 100 MG tablet Take 1 tablet (100 mg total) by  mouth 2 (two) times daily. (Patient not taking: Reported on 04/12/2022) 20 tablet 0   predniSONE (DELTASONE) 20 MG tablet Take 1 tablet (20 mg total) by mouth daily with breakfast. (Patient not taking: Reported on 04/12/2022) 5 tablet 0   sulfamethoxazole-trimethoprim (BACTRIM DS) 800-160 MG tablet Take 1 tablet by mouth 2 (two) times daily. (Patient not taking: Reported on 04/12/2022) 14 tablet 0   No facility-administered medications prior to visit.    Review of Systems  Constitutional: Negative.   HENT: Negative.    Eyes: Negative.   Respiratory: Negative.    Cardiovascular: Negative.   Gastrointestinal: Negative.   Genitourinary: Negative.   Musculoskeletal: Negative.   Skin: Negative.   Neurological: Negative.   Psychiatric/Behavioral: Negative.    All other systems reviewed and are negative.    Objective:     BP 108/68   Pulse 67   Temp 98.1 F (36.7  C) (Temporal)   Resp 16   Ht _0  (1.626 m)   Wt 138 lb 6.4 oz (62.8 kg)   SpO2 98%   BMI 23.76 kg/m   Wt Readings from Last 3 Encounters:  04/12/22 138 lb 6.4 oz (62.8 kg)  03/21/22 140 lb (63.5 kg)  03/01/22 139 lb 9.6 oz (63.3 kg)   Physical Exam Vitals and nursing note reviewed.  Constitutional:      General: She is not in acute distress.    Appearance: Normal appearance. She is normal weight. She is not ill-appearing, toxic-appearing or diaphoretic.  Cardiovascular:     Rate and Rhythm: Normal rate and regular rhythm.     Heart sounds: Normal heart sounds. No murmur heard.   No friction rub. No gallop.  Pulmonary:     Effort: Pulmonary effort is normal. No respiratory distress.     Breath sounds: Normal breath sounds. No stridor. No wheezing, rhonchi or rales.  Chest:     Chest wall: No tenderness.  Skin:    General: Skin is warm and dry.  Neurological:     General: No focal deficit present.     Mental Status: She is alert and oriented to person, place, and time. Mental status is at baseline.  Psychiatric:        Mood and Affect: Mood normal.        Behavior: Behavior normal.        Thought Content: Thought content normal.        Judgment: Judgment normal.    Results for orders placed or performed in visit on 04/12/22  POCT HgB A1C  Result Value Ref Range   Hemoglobin A1C 12.6 (A) 4.0 - 5.6 %   HbA1c POC (<> result, manual entry)     HbA1c, POC (prediabetic range)     HbA1c, POC (controlled diabetic range)        The 10-year ASCVD risk score (Arnett DK, et al., 2019) is: 16.9%    Assessment & Plan:   Problem List Items Addressed This Visit       Endocrine   Diabetes ([place]) - Primary    Add novolog 6 units tid ac. Increase tresiba to 34 units nightly for one mo then to 38 units nightly if fasting glucose above goal of <150.  Return in 3 mo for recheck Patient encouraged to call clinic with any questions, comments, or concerns.         Relevant  Medications   atorvastatin (LIPITOR) 20 MG tablet   insulin aspart (NOVOLOG) 100 UNIT/ML injection   TRESIBA  FLEXTOUCH 100 UNIT/ML FlexTouch Pen   Other Relevant Orders   POCT HgB A1C (Completed)    Meds ordered this encounter  Medications   insulin aspart (NOVOLOG) 100 UNIT/ML injection    Sig: Inject 6 Units into the skin 3 (three) times daily before meals.    Dispense:  20 mL    Refill:  0    Order Specific Question:   Supervising Provider    Answer:   Carlota Raspberry, JEFFREY R [[id number]]   TRESIBA FLEXTOUCH 100 UNIT/ML FlexTouch Pen    Sig: Inject 34 Units into the skin daily for 30 days, THEN 38 Units daily.    Dispense:  33 mL    Refill:  0    DX Code Needed  .    Order Specific Question:   Supervising Provider    Answer:   Wendie Agreste [[id number]]    Return in about 3 months (around 07/13/2022) for Chronic Conditions.    Maximiano Coss, NP

## 2022-04-25 ENCOUNTER — Other Ambulatory Visit: Payer: Self-pay | Admitting: Registered Nurse

## 2022-04-25 DIAGNOSIS — E1122 Type 2 diabetes mellitus with diabetic chronic kidney disease: Secondary | ICD-10-CM

## 2022-04-26 ENCOUNTER — Other Ambulatory Visit: Payer: Self-pay | Admitting: Registered Nurse

## 2022-04-26 DIAGNOSIS — E1122 Type 2 diabetes mellitus with diabetic chronic kidney disease: Secondary | ICD-10-CM

## 2022-04-26 MED ORDER — INSULIN ASPART (W/NIACINAMIDE) 100 UNIT/ML ~~LOC~~ SOPN: 8.0000 [IU] | PEN_INJECTOR | Freq: Three times a day (TID) | SUBCUTANEOUS | 0 refills | Status: DC

## 2022-04-27 ENCOUNTER — Other Ambulatory Visit: Payer: Self-pay

## 2022-04-27 ENCOUNTER — Telehealth: Payer: Self-pay | Admitting: Registered Nurse

## 2022-04-27 DIAGNOSIS — E1122 Type 2 diabetes mellitus with diabetic chronic kidney disease: Secondary | ICD-10-CM

## 2022-04-27 NOTE — Telephone Encounter (Signed)
Pts novolog injection went in as a vial rather than pen and pt is asking for this to be changed to the pen, attempted to change myself but unsure of correct order for this pen as multiple did come up. Please advise

## 2022-04-27 NOTE — Telephone Encounter (Signed)
PT stats she want Evaristo Bury Flextouch pen not vial. PT stats pharmacy need

## 2022-04-28 MED ORDER — NOVOLOG PENFILL 100 UNIT/ML ~~LOC~~ SOCT
6.0000 [IU] | Freq: Three times a day (TID) | SUBCUTANEOUS | 2 refills | Status: DC
Start: 2022-04-28 — End: 2022-04-28

## 2022-04-28 NOTE — Telephone Encounter (Signed)
Changed to novolog penfill, but it looks like she also has been prescribed insulin aspart on June 8, different dosage.  We will need to clarify with pharmacy if this was filled.

## 2022-04-28 NOTE — Telephone Encounter (Signed)
Clarified with pharmacy - she did pick up the insulin aspart few days ago-  that will serve as her short acting, NovoLog penfill that I ordered this morning was canceled.

## 2022-05-03 ENCOUNTER — Other Ambulatory Visit: Payer: Self-pay | Admitting: Registered Nurse

## 2022-05-03 DIAGNOSIS — E1122 Type 2 diabetes mellitus with diabetic chronic kidney disease: Secondary | ICD-10-CM

## 2022-06-21 ENCOUNTER — Encounter: Payer: Self-pay | Admitting: Registered Nurse

## 2022-06-21 ENCOUNTER — Other Ambulatory Visit: Payer: Medicare Other

## 2022-06-21 ENCOUNTER — Ambulatory Visit (INDEPENDENT_AMBULATORY_CARE_PROVIDER_SITE_OTHER): Payer: Medicare Other | Admitting: Registered Nurse

## 2022-06-21 ENCOUNTER — Other Ambulatory Visit: Payer: Self-pay

## 2022-06-21 VITALS — BP 114/68 | HR 72 | Temp 98.3°F | Resp 18 | Ht 64.0 in | Wt 145.2 lb

## 2022-06-21 DIAGNOSIS — E1122 Type 2 diabetes mellitus with diabetic chronic kidney disease: Secondary | ICD-10-CM

## 2022-06-21 DIAGNOSIS — B354 Tinea corporis: Secondary | ICD-10-CM

## 2022-06-21 DIAGNOSIS — R3989 Other symptoms and signs involving the genitourinary system: Secondary | ICD-10-CM | POA: Diagnosis not present

## 2022-06-21 DIAGNOSIS — Z794 Long term (current) use of insulin: Secondary | ICD-10-CM | POA: Diagnosis not present

## 2022-06-21 LAB — CBC WITH DIFFERENTIAL/PLATELET
Basophils Absolute: 0 10*3/uL (ref 0.0–0.1)
Basophils Relative: 0.3 % (ref 0.0–3.0)
Eosinophils Absolute: 0.1 10*3/uL (ref 0.0–0.7)
Eosinophils Relative: 2.4 % (ref 0.0–5.0)
HCT: 38.5 % (ref 36.0–46.0)
Hemoglobin: 12.9 g/dL (ref 12.0–15.0)
Lymphocytes Relative: 24.9 % (ref 12.0–46.0)
Lymphs Abs: 1 10*3/uL (ref 0.7–4.0)
MCHC: 33.4 g/dL (ref 30.0–36.0)
MCV: 79 fl (ref 78.0–100.0)
Monocytes Absolute: 0.2 10*3/uL (ref 0.1–1.0)
Monocytes Relative: 5 % (ref 3.0–12.0)
Neutro Abs: 2.8 10*3/uL (ref 1.4–7.7)
Neutrophils Relative %: 67.4 % (ref 43.0–77.0)
Platelets: 145 10*3/uL — ABNORMAL LOW (ref 150.0–400.0)
RBC: 4.88 Mil/uL (ref 3.87–5.11)
RDW: 16 % — ABNORMAL HIGH (ref 11.5–15.5)
WBC: 4.1 10*3/uL (ref 4.0–10.5)

## 2022-06-21 LAB — POCT GLYCOSYLATED HEMOGLOBIN (HGB A1C): Hemoglobin A1C: 11.1 % — AB (ref 4.0–5.6)

## 2022-06-21 LAB — COMPREHENSIVE METABOLIC PANEL
ALT: 13 U/L (ref 0–35)
AST: 12 U/L (ref 0–37)
Albumin: 4.5 g/dL (ref 3.5–5.2)
Alkaline Phosphatase: 103 U/L (ref 39–117)
BUN: 25 mg/dL — ABNORMAL HIGH (ref 6–23)
CO2: 27 mEq/L (ref 19–32)
Calcium: 9.8 mg/dL (ref 8.4–10.5)
Chloride: 96 mEq/L (ref 96–112)
Creatinine, Ser: 0.98 mg/dL (ref 0.40–1.20)
GFR: 59.03 mL/min — ABNORMAL LOW (ref >60.00)
Glucose, Bld: 454 mg/dL — ABNORMAL HIGH (ref 70–99)
Potassium: 5.2 mEq/L — ABNORMAL HIGH (ref 3.5–5.1)
Sodium: 132 mEq/L — ABNORMAL LOW (ref 135–145)
Total Bilirubin: 0.9 mg/dL (ref 0.2–1.2)
Total Protein: 7.9 g/dL (ref 6.0–8.3)

## 2022-06-21 LAB — URINALYSIS WITH CULTURE, IF INDICATED
Bilirubin Urine: NEGATIVE
Nitrite: NEGATIVE
Specific Gravity, Urine: 1.02 (ref 1.000–1.030)
Total Protein, Urine: 30 — AB
Urine Glucose: >=1000 — AB
Urobilinogen, UA: 1 (ref 0.0–1.0)
pH: 5.5 (ref 5.0–8.0)

## 2022-06-21 LAB — LIPID PANEL
Cholesterol: 170 mg/dL (ref 0–200)
HDL: 60.2 mg/dL
LDL Cholesterol: 77 mg/dL (ref 0–99)
NonHDL: 109.48
Total CHOL/HDL Ratio: 3
Triglycerides: 164 mg/dL — ABNORMAL HIGH (ref 0.0–149.0)
VLDL: 32.8 mg/dL (ref 0.0–40.0)

## 2022-06-21 LAB — MICROALBUMIN / CREATININE URINE RATIO
Creatinine,U: 77.4 mg/dL
Microalb Creat Ratio: 27.8 mg/g (ref 0.0–30.0)
Microalb, Ur: 21.5 mg/dL — ABNORMAL HIGH (ref 0.0–1.9)

## 2022-06-21 MED ORDER — CLOTRIMAZOLE-BETAMETHASONE 1-0.05 % EX CREA: 1.0000 | TOPICAL_CREAM | Freq: Every day | CUTANEOUS | 0 refills | Status: DC

## 2022-06-21 NOTE — Patient Instructions (Signed)
Ms. [name] to see you!  Glad the sugars are starting to get better.  I recommend these providers:  Glenetta Hew, MD Jacquiline Doe, MD Roxy Manns, MD Eileen Stanford, MD Sanda Linger, MD  I will call if labs are worrisome  Thank you for letting me take part in your care,  Rich

## 2022-06-21 NOTE — Progress Notes (Signed)
Established Patient Office Visit  Subjective:  Patient ID: [name], female    DOB: 1950/12/24  Age: 71 y.o. MRN: [id number]  CC:  Chief Complaint  Patient presents with   Medication Refill    Patient is here for a medication refill and sores on legs, arms and chest that's popping up.    HPI [name] presents for t2dm, rash  Last A1c:  Lab Results  Component Value Date   HGBA1C 11.1 (A) 06/21/2022    Currently taking: tresiba 38 units nightly, aspart 8 units tid preprandial No new complications Reports improving compliance with medications Diet has been improved since last visit Exercise habits have been steady, limited.   Rash Across arms, legs, trunk. Red, round, well defined. Itching at times OTC cortisone helps but does not resolve rash No changes, has been steady for weeks to months No sick contacts  Outpatient Medications Prior to Visit  Medication Sig Dispense Refill   ARIPiprazole (ABILIFY) 30 MG tablet Take 30 mg by mouth at bedtime.     aspirin 81 MG EC tablet Take 81 mg by mouth at bedtime.     atorvastatin (LIPITOR) 20 MG tablet Take 20 mg by mouth daily.     atorvastatin (LIPITOR) 20 MG tablet Take 1 tablet by mouth daily.     benztropine (COGENTIN) 1 MG tablet Take 1 mg by mouth at bedtime.      Blood Glucose Monitoring Suppl (ONETOUCH VERIO) w/Device KIT by Does not apply route.     buPROPion (WELLBUTRIN) 75 MG tablet Take 75 mg by mouth 2 (two) times daily.     Cholecalciferol 50 MCG (2000 UT) CAPS Take by mouth.     Continuous Blood Gluc Receiver (DEXCOM G6 RECEIVER) DEVI Use as directed for continuous glucose monitoring.     Continuous Blood Gluc Sensor (FREESTYLE LIBRE 2 SENSOR) MISC 1 each by Does not apply route every 14 (fourteen) days. 7 each 1   haloperidol (HALDOL) 1 MG tablet Take 1 mg by mouth daily.     hydrOXYzine (ATARAX) 25 MG tablet Take 25-50 mg by mouth at bedtime as needed.     insulin aspart (FIASP) 100 UNIT/ML  FlexTouch Pen Inject 8 Units into the skin 3 (three) times daily before meals. 15 mL 0   metoprolol succinate (TOPROL-XL) 25 MG 24 hr tablet Take 0.5 tablets (12.5 mg total) by mouth daily. 90 tablet 1   mirtazapine (REMERON) 30 MG tablet Take 30 mg by mouth at bedtime.      OYSCO 500 500 MG TABS Take 1 tablet (1,250 mg total) by mouth daily. 90 tablet 1   TRESIBA FLEXTOUCH 100 UNIT/ML FlexTouch Pen INJECT 34 UNITS INTO THE SKIN DAILY FOR 30 DAYS, THEN 38 UNITS DAILY. 6 mL 1   potassium chloride (KLOR-CON) 10 MEQ tablet Take 2 tablets (20 mEq total) by mouth daily for 15 days. 30 tablet 0   No facility-administered medications prior to visit.    Review of Systems  Constitutional: Negative.   HENT: Negative.    Eyes: Negative.   Respiratory: Negative.    Cardiovascular: Negative.   Gastrointestinal: Negative.   Genitourinary: Negative.   Musculoskeletal: Negative.   Skin: Negative.   Neurological: Negative.   Psychiatric/Behavioral: Negative.    All other systems reviewed and are negative.     Objective:     BP 114/68   Pulse 72   Temp 98.3 F (36.8 C) (Temporal)   Resp 18   Ht 5'  4" (1.626 m)   Wt 145 lb 3.2 oz (65.9 kg)   SpO2 92%   BMI 24.92 kg/m   Wt Readings from Last 3 Encounters:  06/21/22 145 lb 3.2 oz (65.9 kg)  04/12/22 138 lb 6.4 oz (62.8 kg)  03/21/22 140 lb (63.5 kg)   Physical Exam Vitals and nursing note reviewed.  Constitutional:      General: She is not in acute distress.    Appearance: Normal appearance. She is normal weight. She is not ill-appearing, toxic-appearing or diaphoretic.  Cardiovascular:     Rate and Rhythm: Normal rate and regular rhythm.     Heart sounds: Normal heart sounds. No murmur heard.    No friction rub. No gallop.  Pulmonary:     Effort: Pulmonary effort is normal. No respiratory distress.     Breath sounds: Normal breath sounds. No stridor. No wheezing, rhonchi or rales.  Chest:     Chest wall: No tenderness.  Skin:     General: Skin is warm and dry.     Findings: Rash (appears as fungal rash) present.  Neurological:     General: No focal deficit present.     Mental Status: She is alert and oriented to person, place, and time. Mental status is at baseline.  Psychiatric:        Mood and Affect: Mood normal.        Behavior: Behavior normal.        Thought Content: Thought content normal.        Judgment: Judgment normal.     Results for orders placed or performed in visit on 06/21/22  CBC with Differential/Platelet  Result Value Ref Range   WBC 4.1 4.0 - 10.5 K/uL   RBC 4.88 3.87 - 5.11 Mil/uL   Hemoglobin 12.9 12.0 - 15.0 g/dL   HCT 38.5 36.0 - 46.0 %   MCV 79.0 78.0 - 100.0 fl   MCHC 33.4 30.0 - 36.0 g/dL   RDW 16.0 (H) 11.5 - 15.5 %   Platelets 145.0 (L) 150.0 - 400.0 K/uL   Neutrophils Relative % 67.4 43.0 - 77.0 %   Lymphocytes Relative 24.9 12.0 - 46.0 %   Monocytes Relative 5.0 3.0 - 12.0 %   Eosinophils Relative 2.4 0.0 - 5.0 %   Basophils Relative 0.3 0.0 - 3.0 %   Neutro Abs 2.8 1.4 - 7.7 K/uL   Lymphs Abs 1.0 0.7 - 4.0 K/uL   Monocytes Absolute 0.2 0.1 - 1.0 K/uL   Eosinophils Absolute 0.1 0.0 - 0.7 K/uL   Basophils Absolute 0.0 0.0 - 0.1 K/uL  Comprehensive metabolic panel  Result Value Ref Range   Sodium 132 (L) 135 - 145 mEq/L   Potassium 5.2 (H) 3.5 - 5.1 mEq/L   Chloride 96 96 - 112 mEq/L   CO2 27 19 - 32 mEq/L   Glucose, Bld 454 (H) 70 - 99 mg/dL   BUN 25 (H) 6 - 23 mg/dL   Creatinine, Ser 0.98 0.40 - 1.20 mg/dL   Total Bilirubin 0.9 0.2 - 1.2 mg/dL   Alkaline Phosphatase 103 39 - 117 U/L   AST 12 0 - 37 U/L   ALT 13 0 - 35 U/L   Total Protein 7.9 6.0 - 8.3 g/dL   Albumin 4.5 3.5 - 5.2 g/dL   GFR 59.03 (L) >60.00 mL/min   Calcium 9.8 8.4 - 10.5 mg/dL  Lipid panel  Result Value Ref Range   Cholesterol 170 0 - 200 mg/dL  Triglycerides 164.0 (H) 0.0 - 149.0 mg/dL   HDL 60.20 >39.00 mg/dL   VLDL 32.8 0.0 - 40.0 mg/dL   LDL Cholesterol 77 0 - 99 mg/dL   Total  CHOL/HDL Ratio 3    NonHDL 109.48   Microalbumin / creatinine urine ratio  Result Value Ref Range   Microalb, Ur 21.5 (H) 0.0 - 1.9 mg/dL   Creatinine,U 77.4 mg/dL   Microalb Creat Ratio 27.8 0.0 - 30.0 mg/g  Urinalysis with Culture, if indicated   Specimen: Urine  Result Value Ref Range   Color, Urine YELLOW Yellow;Lt. Yellow;Straw;Dark Yellow;Amber;Green;Red;Brown   APPearance CLEAR Clear;Turbid;Slightly Cloudy;Cloudy   Specific Gravity, Urine 1.020 1.000 - 1.030   pH 5.5 5.0 - 8.0   Total Protein, Urine 30 (A) Negative   Urine Glucose >=1000 (A) Negative   Ketones, ur TRACE (A) Negative   Bilirubin Urine NEGATIVE Negative   Hgb urine dipstick TRACE-INTACT (A) Negative   Urobilinogen, UA 1.0 0.0 - 1.0   Leukocytes,Ua SMALL (A) Negative   Nitrite NEGATIVE Negative   WBC, UA 21-50/hpf (A) 0-2/hpf   RBC / HPF 0-2/hpf 0-2/hpf   Squamous Epithelial / LPF Rare(0-4/hpf) Rare(0-4/hpf)   Bacteria, UA Many(>50/hpf) (A) None  POCT glycosylated hemoglobin (Hb A1C)  Result Value Ref Range   Hemoglobin A1C 11.1 (A) 4.0 - 5.6 %   HbA1c POC (<> result, manual entry)     HbA1c, POC (prediabetic range)     HbA1c, POC (controlled diabetic range)        The 10-year ASCVD risk score (Arnett DK, et al., 2019) is: 19.3%    Assessment & Plan:   Problem List Items Addressed This Visit       Endocrine   Diabetes ([place]) - Primary   Relevant Orders   POCT glycosylated hemoglobin (Hb A1C) (Completed)   CBC with Differential/Platelet (Completed)   Comprehensive metabolic panel (Completed)   Lipid panel (Completed)   Microalbumin / creatinine urine ratio (Completed)   Urinalysis with Culture, if indicated (Completed)   Urinalysis with Culture, if indicated   Other Visit Diagnoses     Tinea corporis       Relevant Medications   clotrimazole-betamethasone (LOTRISONE) cream   Other Relevant Orders   Urinalysis with Culture, if indicated   Abnormal urine color       Relevant Orders    Urinalysis with Culture, if indicated (Completed)   Urinalysis with Culture, if indicated       Meds ordered this encounter  Medications   clotrimazole-betamethasone (LOTRISONE) cream    Sig: Apply 1 Application topically daily.    Dispense:  30 g    Refill:  0    Order Specific Question:   Supervising Provider    Answer:   Carlota Raspberry, JEFFREY R [[id number]]    Return in about 3 months (around 09/21/2022).   PLAN Have seen improvement in t2dm. Encouraged to titrate up tresiba 3 units every week until reaching 47 units nightly or goal fasting sugars, whichever comes first. Follow up with new PCP in 4-6 weeks. Sending betamethasone-clotrimazole for rash. Follow up if worsening or failing to improve Will send urinalysis with reflex to culture given abnormal appearance of urine Patient encouraged to call clinic with any questions, comments, or concerns.  Maximiano Coss, NP

## 2022-06-24 LAB — URINE CULTURE
MICRO NUMBER:: 13731999
SPECIMEN QUALITY:: ADEQUATE

## 2022-06-29 ENCOUNTER — Telehealth: Payer: Self-pay | Admitting: Registered Nurse

## 2022-07-02 NOTE — Telephone Encounter (Signed)
error 

## 2022-07-05 ENCOUNTER — Ambulatory Visit: Payer: Medicare Other | Admitting: Family Medicine

## 2022-07-13 ENCOUNTER — Ambulatory Visit (INDEPENDENT_AMBULATORY_CARE_PROVIDER_SITE_OTHER): Payer: Medicare Other | Admitting: Family Medicine

## 2022-07-13 ENCOUNTER — Encounter: Payer: Self-pay | Admitting: Family Medicine

## 2022-07-13 VITALS — BP 118/68 | HR 85 | Temp 97.9°F | Resp 16 | Ht 64.0 in | Wt 150.4 lb

## 2022-07-13 DIAGNOSIS — B354 Tinea corporis: Secondary | ICD-10-CM | POA: Diagnosis not present

## 2022-07-13 MED ORDER — CLOTRIMAZOLE 1 % EX CREA
1.0000 | TOPICAL_CREAM | Freq: Two times a day (BID) | CUTANEOUS | 0 refills | Status: DC
Start: 2022-07-13 — End: 2022-08-16

## 2022-07-13 NOTE — Patient Instructions (Signed)
Please establish with a new provider to avoid any lapse in your care APPLY the Clotrimazole twice daily to the rash Call with any questions or concerns Hang in there!

## 2022-07-13 NOTE — Progress Notes (Signed)
   Subjective:    Patient ID: [name], female    DOB: [birth year], 71 y.o.   MRN: [id number]  HPI Rash- on R thigh.  Doesn't hurt or itch.  Was prescribed a combination steroid and antifungal medication but this did not help 2 of the spots.  The 2 remaining areas have a well circumscribed margin and are papular   Review of Systems For ROS see HPI     Objective:   Physical Exam Vitals reviewed.  Constitutional:      Comments: Disheveled, malodorous  HENT:     Head: Normocephalic and atraumatic.  Skin:    General: Skin is warm and dry.     Findings: Lesion (2 well circumscribed areas on R thigh consistent w/ tinea corporis) present.  Neurological:     Mental Status: She is alert.           Assessment & Plan:  Tinea Corporis- new to provider, pt has hx of similar.  Will start topical Clotrimazole BID.  Pt expressed understanding and is in agreement w/ plan.

## 2022-07-16 ENCOUNTER — Ambulatory Visit: Payer: Medicare Other | Admitting: Registered Nurse

## 2022-07-25 ENCOUNTER — Telehealth: Payer: Self-pay | Admitting: Registered Nurse

## 2022-07-25 ENCOUNTER — Other Ambulatory Visit: Payer: Self-pay

## 2022-07-25 DIAGNOSIS — E1122 Type 2 diabetes mellitus with diabetic chronic kidney disease: Secondary | ICD-10-CM

## 2022-07-25 MED ORDER — TRESIBA FLEXTOUCH 100 UNIT/ML ~~LOC~~ SOPN: PEN_INJECTOR | SUBCUTANEOUS | 1 refills | Status: DC

## 2022-07-25 NOTE — Telephone Encounter (Signed)
Pt called back with phone # for [facility] pharmacy [phone].  States that pharmacy closes at 6pm.

## 2022-07-25 NOTE — Telephone Encounter (Signed)
Spoke w/ pt and advised that I sent in her Trulicity to the pharmacy and advised her that she will need to est care with a new provider

## 2022-07-25 NOTE — Telephone Encounter (Signed)
Encourage patient to contact the pharmacy for refills or they can request refills through [facility]  (Please schedule appointment if patient has not been seen in over a year)    WHAT PHARMACY WOULD THEY LIKE THIS SENT TO: [address] [place], [place] [address]  MEDICATION NAME & DOSE:Tresiba Flextouch 100 Unit/ML  NOTES/COMMENTS FROM PATIENT: pt is out of town and pt needs her flexpen refill. The best contact number for py is [phone].   Pt also states that her rashes has gotten worst since her last visit with Dr.Tabori on 07/13/22. Pt stats that she has rashes all on her neck. Pt states that she been using triple antibiotic on until she gets home.         Front office please notify patient: It takes 48-72 hours to process rx refill requests Ask patient to call pharmacy to ensure rx is ready before heading there.

## 2022-08-03 ENCOUNTER — Ambulatory Visit: Payer: Medicare Other | Admitting: Family Medicine

## 2022-08-07 ENCOUNTER — Telehealth: Payer: Self-pay | Admitting: Registered Nurse

## 2022-08-07 ENCOUNTER — Ambulatory Visit (INDEPENDENT_AMBULATORY_CARE_PROVIDER_SITE_OTHER): Payer: Medicare Other | Admitting: Family Medicine

## 2022-08-07 ENCOUNTER — Encounter: Payer: Self-pay | Admitting: Family Medicine

## 2022-08-07 VITALS — BP 120/76 | HR 64 | Temp 97.0°F | Resp 16 | Ht 64.0 in | Wt 156.4 lb

## 2022-08-07 DIAGNOSIS — R350 Frequency of micturition: Secondary | ICD-10-CM | POA: Diagnosis not present

## 2022-08-07 DIAGNOSIS — Z794 Long term (current) use of insulin: Secondary | ICD-10-CM

## 2022-08-07 DIAGNOSIS — E1122 Type 2 diabetes mellitus with diabetic chronic kidney disease: Secondary | ICD-10-CM | POA: Diagnosis not present

## 2022-08-07 LAB — POCT URINALYSIS DIPSTICK
Bilirubin, UA: NEGATIVE
Blood, UA: NEGATIVE
Glucose, UA: POSITIVE — AB
Ketones, UA: NEGATIVE
Protein, UA: NEGATIVE
Spec Grav, UA: 1.02 (ref 1.010–1.025)
Urobilinogen, UA: 0.2 E.U./dL
pH, UA: 8 (ref 5.0–8.0)

## 2022-08-07 MED ORDER — INSULIN DEGLUDEC 100 UNIT/ML ~~LOC~~ SOPN: 34.0000 [IU] | PEN_INJECTOR | Freq: Every day | SUBCUTANEOUS | Status: DC

## 2022-08-07 MED ORDER — CEPHALEXIN 500 MG PO CAPS: 500.0000 mg | ORAL_CAPSULE | Freq: Two times a day (BID) | ORAL | 0 refills | Status: AC

## 2022-08-07 NOTE — Patient Instructions (Signed)
Schedule an appt at [facility] to establish care START the Cephalexin (Keflex) twice daily- take w/ food Drink LOTS of fluids to flush your system We'll call you with your Endocrinology appt Call with any questions or concerns Hang in there!!!

## 2022-08-07 NOTE — Progress Notes (Signed)
   Subjective:    Patient ID: [name], female    DOB: 05-19-1951, 71 y.o.   MRN: [id number]  HPI Urinary frequency- sxs started 3-4 days ago.  Is having urgency and at times not able to make it to restroom.  + burning w/ urination.  No visible blood.  Has hx of UTIs.  Pt reports this feels similar to previous UTIs.  + suprapubic pressure.  Denies CVA tenderness.  No fevers.   Review of Systems For ROS see HPI     Objective:   Physical Exam Vitals reviewed.  Constitutional:      General: She is not in acute distress.    Appearance: Normal appearance. She is well-developed. She is not ill-appearing.  Abdominal:     General: There is no distension.     Palpations: Abdomen is soft.     Tenderness: There is abdominal tenderness (+ suprapubic tenderness but no CVA tenderness).  Skin:    General: Skin is warm and dry.  Neurological:     General: No focal deficit present.     Mental Status: She is alert.  Psychiatric:        Mood and Affect: Mood normal.        Behavior: Behavior normal.           Assessment & Plan:  Urinary frequency- new to provider, recurrent issue for pt.  She reports this feels similar to prior UTIs.  UA consistent w/ infxn.  Start Keflex (pt reports she has taken w/o difficulty in the past) and adjust based on culture results.  Pt expressed understanding and is in agreement w/ plan.   DM- pt was previously seeing PCP for DM but given her lack of control, she really would benefit from seeing Endo.  Referral placed.

## 2022-08-07 NOTE — Telephone Encounter (Signed)
Unable to reach patient - vm full. Patient to sch TOC with Randol Kern in our practice [facility] -

## 2022-08-10 ENCOUNTER — Telehealth: Payer: Self-pay

## 2022-08-10 LAB — URINE CULTURE
MICRO NUMBER:: 13938209
SPECIMEN QUALITY:: ADEQUATE

## 2022-08-10 NOTE — Telephone Encounter (Signed)
-----   Message from Midge Minium, MD sent at 08/10/2022  4:04 PM EDT ----- Your UTI is being treated appropriately.  Hopefully you are feeling better!

## 2022-08-16 ENCOUNTER — Encounter: Payer: Self-pay | Admitting: Family Medicine

## 2022-08-16 ENCOUNTER — Ambulatory Visit (INDEPENDENT_AMBULATORY_CARE_PROVIDER_SITE_OTHER): Payer: Medicare Other | Admitting: Family Medicine

## 2022-08-16 VITALS — BP 120/68 | HR 76 | Temp 97.5°F | Ht 64.0 in | Wt 154.6 lb

## 2022-08-16 DIAGNOSIS — E1159 Type 2 diabetes mellitus with other circulatory complications: Secondary | ICD-10-CM

## 2022-08-16 DIAGNOSIS — I152 Hypertension secondary to endocrine disorders: Secondary | ICD-10-CM | POA: Diagnosis not present

## 2022-08-16 DIAGNOSIS — Z1211 Encounter for screening for malignant neoplasm of colon: Secondary | ICD-10-CM

## 2022-08-16 DIAGNOSIS — E1169 Type 2 diabetes mellitus with other specified complication: Secondary | ICD-10-CM

## 2022-08-16 DIAGNOSIS — E1022 Type 1 diabetes mellitus with diabetic chronic kidney disease: Secondary | ICD-10-CM

## 2022-08-16 DIAGNOSIS — Z23 Encounter for immunization: Secondary | ICD-10-CM

## 2022-08-16 DIAGNOSIS — E785 Hyperlipidemia, unspecified: Secondary | ICD-10-CM

## 2022-08-16 DIAGNOSIS — F259 Schizoaffective disorder, unspecified: Secondary | ICD-10-CM

## 2022-08-16 NOTE — Assessment & Plan Note (Signed)
Last A1c uncontrolled at 11.1.  Patient is letting her boyfriend manage her insulin and she has not been routinely checking her blood sugars due to not having a meter at home.  She states that she will go get a meter soon.  She does not have any symptoms of hyperglycemia or hypoglycemia.  She is unsure how much insulin she has been taking and she has had numerous admissions for DKA over the last several years.  Discussed with patient importance of good glycemic control to prevent complications and future hospitalizations.  She voiced understanding.  She will be getting a new glucometer today and hopefully will be able to track her sugars more frequently.  Advised her to check her insulin when she gets home to verify the dose.  We currently have her prescribed Tresiba 38 units daily and insulin aspart 8 units 3 times daily before meals.  We will continue this current regimen for now.  She will come back to see me in a couple months and we can recheck A1c at that time but hopefully she will be able to have a blood sugar log as well as medications at that visit.

## 2022-08-16 NOTE — Assessment & Plan Note (Signed)
Blood pressure is at goal metoprolol succinate 12.5 mg daily.

## 2022-08-16 NOTE — Patient Instructions (Signed)
It was very nice to see you today!  We will not make any medication changes today.  Your flu shot today.  I will refer you for your colonoscopy.  Please schedule mammogram soon.  Come back to see me in 2 months to recheck your A1c.  Please come back to see me sooner if needed.  Take care, Dr Jerline Pain  PLEASE NOTE:  If you had any lab tests please let us know if you have not heard back within a few days. You may see your results on mychart before we have a chance to review them but we will give you a call once they are reviewed by [place]. If we ordered any referrals today, please let us know if you have not heard from their office within the next week.   Please try these tips to maintain a healthy lifestyle:  Eat at least 3 REAL meals and 1-2 snacks per day.  Aim for no more than 5 hours between eating.  If you eat breakfast, please do so within one hour of getting up.   Each meal should contain half fruits/vegetables, one quarter protein, and one quarter carbs (no bigger than a computer mouse)  Cut down on sweet beverages. This includes juice, soda, and sweet tea.   Drink at least 1 glass of water with each meal and aim for at least 8 glasses per day  Exercise at least 150 minutes every week.

## 2022-08-16 NOTE — Progress Notes (Signed)
[name] is a 71 y.o. female who presents today for an office visit.  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  Patient is letting her boyfriend manage her insulin and she has not been routinely checking her blood sugars due to not having a meter at home.  She states that she will go get a meter soon.  She does not have any symptoms of hyperglycemia or hypoglycemia.  She is unsure how much insulin she has been taking and she has had numerous admissions for DKA over the last several years.  Discussed with patient importance of good glycemic control to prevent complications and future hospitalizations.  She voiced understanding.  She will be getting a new glucometer today and hopefully will be able to track her sugars more frequently.  Advised her to check her insulin when she gets home to verify the dose.  We currently have her prescribed Tresiba 38 units daily and insulin aspart 8 units 3 times daily before meals.  We will continue this current regimen for now.  She will come back to see me in a couple months and we can recheck A1c at that time but hopefully she will be able to have a blood sugar log as well as medications at that visit.  Schizoaffective disorder ([place]) Follows with psychiatry.  Currently on Cogentin 1 mg daily, haloperidol 1 mg daily, Abilify 30 mg daily, mirtazapine 30 mg daily, and Wellbutrin 75 mg twice daily.  Dyslipidemia associated with type 2 diabetes mellitus (HCC) On atorvastatin 40 mg daily.  Hypertension associated with diabetes ([place]) Blood pressure is at goal metoprolol succinate 12.5 mg daily.     Subjective:  HPI:  See A/P for status of chronic conditions.  ROS: Per HPI, otherwise a complete review of systems was negative.   PMH:  The following were reviewed and entered/updated in epic: Past Medical History:  Diagnosis Date    Allergy    Depression    Diabetes mellitus without complication ([place])    Hyperlipidemia    Hypertension    Mixed stress and urge urinary incontinence    Osteoporosis    Pneumonia    Schizophrenia ([place])    Stroke [facility])    Patient Active Problem List   Diagnosis Date Noted   Type 1 diabetes mellitus with diabetic chronic kidney disease ([place]) 08/16/2022   Hypertension associated with diabetes ([place]) 08/16/2022   Dyslipidemia associated with type 2 diabetes mellitus ([place]) 08/16/2022   Schizoaffective disorder ([place]) 11/08/2014   History reviewed. No pertinent surgical history.  Family History  Problem Relation Age of Onset   Heart disease Mother    Hypertension Father    Stroke Father    Heart disease Father    Diabetes Father    Emphysema Paternal Grandfather     Medications- reviewed and updated Current Outpatient Medications  Medication Sig Dispense Refill   ARIPiprazole (ABILIFY) 30 MG tablet Take 30 mg by mouth at bedtime.     aspirin 81 MG EC tablet Take 81 mg by mouth at bedtime.     atorvastatin (LIPITOR) 20 MG tablet Take 20 mg by mouth daily.     benztropine (COGENTIN) 1 MG tablet Take 1 mg by mouth at bedtime.      Blood Glucose Monitoring Suppl (ONETOUCH VERIO) w/Device KIT by Does not apply route.     buPROPion (WELLBUTRIN) 75 MG tablet Take 75 mg by mouth 2 (  two) times daily.     Cholecalciferol 50 MCG (2000 UT) CAPS Take by mouth.     clotrimazole-betamethasone (LOTRISONE) cream Apply 1 Application topically daily. 30 g 0   Continuous Blood Gluc Receiver (DEXCOM G6 RECEIVER) DEVI Use as directed for continuous glucose monitoring.     Continuous Blood Gluc Sensor (FREESTYLE LIBRE 2 SENSOR) MISC 1 each by Does not apply route every 14 (fourteen) days. 7 each 1   haloperidol (HALDOL) 1 MG tablet Take 1 mg by mouth daily.     hydrOXYzine (ATARAX) 25 MG tablet Take 25-50 mg by mouth at bedtime as needed.     insulin aspart (FIASP) 100 UNIT/ML FlexTouch Pen Inject 8  Units into the skin 3 (three) times daily before meals. 15 mL 0   metoprolol succinate (TOPROL-XL) 25 MG 24 hr tablet Take 0.5 tablets (12.5 mg total) by mouth daily. 90 tablet 1   mirtazapine (REMERON) 30 MG tablet Take 30 mg by mouth at bedtime.      OYSCO 500 500 MG TABS Take 1 tablet (1,250 mg total) by mouth daily. 90 tablet 1   potassium chloride (KLOR-CON) 10 MEQ tablet Take 2 tablets (20 mEq total) by mouth daily for 15 days. 30 tablet 0   TRESIBA FLEXTOUCH 100 UNIT/ML FlexTouch Pen Inject 34 Units into the skin daily for 30 days, THEN 38 Units daily. 6 mL 1   No current facility-administered medications for this visit.    Allergies-reviewed and updated Allergies  Allergen Reactions   Penicillins Rash and Hives    Did it involve swelling of the face/tongue/throat, SOB, or low BP? Unknown Did it involve sudden or severe rash/hives, skin peeling, or any reaction on the inside of your mouth or nose? Unknown Did you need to seek medical attention at a hospital or doctor's office? Unknown When did it last happen?       If all above answers are "NO", may proceed with cephalosporin use.  Did it involve swelling of the face/tongue/throat, SOB, or low BP? Unknown Did it involve sudden or severe rash/hives, skin peeling, or any reaction on the inside of your mouth or nose? Unknown Did you need to seek medical attention at a hospital or doctor's office? Unknown When did it last happen?       If all above answers are "NO", may proceed with cephalosporin use.    Social History   Socioeconomic History   Marital status: Single    Spouse name: Not on file   Number of children: Not on file   Years of education: Not on file   Highest education level: Not on file  Occupational History   Not on file  Tobacco Use   Smoking status: Former    Packs/day: 3.00    Types: Cigarettes   Smokeless tobacco: Never  Substance and Sexual Activity   Alcohol use: Yes    Alcohol/week: 6.0 standard  drinks of alcohol    Types: 6 Cans of beer per week   Drug use: No   Sexual activity: Not on file  Other Topics Concern   Not on file  Social History Narrative   Not on file   Social Determinants of Health   Financial Resource Strain: Not on file  Food Insecurity: Not on file  Transportation Needs: Not on file  Physical Activity: Not on file  Stress: Not on file  Social Connections: Not on file         Objective:  Physical Exam: BP 120/68  Pulse 76   Temp (!) 97.5 F (36.4 C) (Temporal)   Ht 5' 4" (1.626 m)   Wt 154 lb 9.6 oz (70.1 kg)   SpO2 96%   BMI 26.54 kg/m   Gen: No acute distress, resting comfortably CV: Regular rate and rhythm with no murmurs appreciated Pulm: Normal work of breathing, clear to auscultation bilaterally with no crackles, wheezes, or rhonchi Neuro: Grossly normal, moves all extremities Psych: Normal affect and thought content  Time Spent: 50 minutes of total time was spent on the date of the encounter performing the following actions: chart review prior to seeing the patient including recent visits with previous PCP and recent hospitalizations, obtaining history, performing a medically necessary exam, counseling on the treatment plan, placing orders, and documenting in our EHR.        Algis Greenhouse. Jerline Pain, MD 08/16/2022 12:01 PM

## 2022-08-16 NOTE — Assessment & Plan Note (Signed)
Follows with psychiatry.  Currently on Cogentin 1 mg daily, haloperidol 1 mg daily, Abilify 30 mg daily, mirtazapine 30 mg daily, and Wellbutrin 75 mg twice daily.

## 2022-08-16 NOTE — Assessment & Plan Note (Signed)
On atorvastatin 40 mg daily.

## 2022-08-24 ENCOUNTER — Telehealth: Payer: Self-pay | Admitting: Family Medicine

## 2022-08-24 NOTE — Telephone Encounter (Signed)
Patient requests the following RX's that were previously filled by prior PCP :   LAST APPOINTMENT DATE:  Please schedule appointment if longer than 1 year  08/16/22 (TOC from [place])  NEXT APPOINTMENT DATE: 10/16/22  MEDICATION: benztropine (COGENTIN) 1 MG tablet   AND  buPROPion (WELLBUTRIN) 75 MG tablet   Is the patient out of medication? Yes  PHARMACY:  [id number] - [place], [place] - [address] [place] [address] Phone: [phone]  Fax: [phone]      Let patient know to contact pharmacy at the end of the day to make sure medication is ready.  Please notify patient to allow 48-72 hours to process

## 2022-08-27 NOTE — Telephone Encounter (Signed)
Can we clarify with patient? These should be coming from her psychiatrist.  Algis Greenhouse. Jerline Pain, MD 08/27/2022 3:03 PM

## 2022-09-03 NOTE — Telephone Encounter (Signed)
Pt returned a call to our office on 09/02/22. She is asking for a call back.

## 2022-09-03 NOTE — Telephone Encounter (Signed)
Left message to return call to our office at their convenience.  

## 2022-09-06 ENCOUNTER — Other Ambulatory Visit: Payer: Self-pay | Admitting: Family Medicine

## 2022-09-07 ENCOUNTER — Other Ambulatory Visit: Payer: Self-pay

## 2022-09-07 DIAGNOSIS — Z794 Long term (current) use of insulin: Secondary | ICD-10-CM

## 2022-09-07 MED ORDER — TRESIBA FLEXTOUCH 100 UNIT/ML ~~LOC~~ SOPN: PEN_INJECTOR | SUBCUTANEOUS | 1 refills | Status: DC

## 2022-09-17 ENCOUNTER — Encounter: Payer: Self-pay | Admitting: Family Medicine

## 2022-09-27 ENCOUNTER — Telehealth: Payer: Self-pay | Admitting: Family Medicine

## 2022-09-27 NOTE — Telephone Encounter (Signed)
Caller states she needs RX form that was faxed to Dr. Jimmey Ralph on 09/26/22 to be signed and the most recent Diabetic progress notes to be faxed back to her. Fax# on form.

## 2022-09-28 ENCOUNTER — Telehealth: Payer: Self-pay | Admitting: Family Medicine

## 2022-09-28 ENCOUNTER — Other Ambulatory Visit: Payer: Self-pay | Admitting: *Deleted

## 2022-09-28 DIAGNOSIS — Z794 Long term (current) use of insulin: Secondary | ICD-10-CM

## 2022-09-28 MED ORDER — INSULIN ASPART (W/NIACINAMIDE) 100 UNIT/ML ~~LOC~~ SOPN: 8.0000 [IU] | PEN_INJECTOR | Freq: Three times a day (TID) | SUBCUTANEOUS | 0 refills | Status: DC

## 2022-09-28 NOTE — Telephone Encounter (Signed)
  LAST APPOINTMENT DATE:  Please schedule appointment if longer than 1 year  08/16/22  NEXT APPOINTMENT DATE: 10/16/22  MEDICATION:insulin aspart (FIASP) 100 UNIT/ML FlexTouch Pen   Is the patient out of medication? Yes  PHARMACY:  [id number] - [place], [place] - [address] [place] [address] Phone: [phone]  Fax: [phone]     Let patient know to contact pharmacy at the end of the day to make sure medication is ready.  Please notify patient to allow 48-72 hours to process

## 2022-10-01 NOTE — Telephone Encounter (Signed)
Left message to return call to our office at their convenience.   Need to verified if patient requesting Free style [name] from [facility]

## 2022-10-01 NOTE — Telephone Encounter (Signed)
Rx was send on 09/28/2022

## 2022-10-01 NOTE — Telephone Encounter (Signed)
Jamala with [facility] is requesting an update on the fax sent regarding Freestyle Libre 3 for this pt. Please call [phone] ext. [phone]

## 2022-10-09 NOTE — Telephone Encounter (Signed)
Jamala with [facility] is requesting an update on this issue. Please call [phone], ext [phone]

## 2022-10-15 NOTE — Telephone Encounter (Signed)
Form faxed

## 2022-10-16 ENCOUNTER — Encounter: Payer: Self-pay | Admitting: Family Medicine

## 2022-10-16 ENCOUNTER — Ambulatory Visit (INDEPENDENT_AMBULATORY_CARE_PROVIDER_SITE_OTHER): Payer: Medicare Other | Admitting: Family Medicine

## 2022-10-16 VITALS — BP 118/75 | HR 68 | Temp 97.7°F | Ht 64.0 in | Wt 152.4 lb

## 2022-10-16 DIAGNOSIS — I152 Hypertension secondary to endocrine disorders: Secondary | ICD-10-CM

## 2022-10-16 DIAGNOSIS — E1159 Type 2 diabetes mellitus with other circulatory complications: Secondary | ICD-10-CM

## 2022-10-16 DIAGNOSIS — R051 Acute cough: Secondary | ICD-10-CM

## 2022-10-16 DIAGNOSIS — E1022 Type 1 diabetes mellitus with diabetic chronic kidney disease: Secondary | ICD-10-CM | POA: Diagnosis not present

## 2022-10-16 DIAGNOSIS — E1122 Type 2 diabetes mellitus with diabetic chronic kidney disease: Secondary | ICD-10-CM

## 2022-10-16 DIAGNOSIS — Z794 Long term (current) use of insulin: Secondary | ICD-10-CM

## 2022-10-16 LAB — POC COVID19 BINAXNOW: SARS Coronavirus 2 Ag: NEGATIVE

## 2022-10-16 LAB — POCT GLYCOSYLATED HEMOGLOBIN (HGB A1C): Hemoglobin A1C: 11.4 % — AB (ref 4.0–5.6)

## 2022-10-16 MED ORDER — BENZONATATE 200 MG PO CAPS: 200.0000 mg | ORAL_CAPSULE | Freq: Two times a day (BID) | ORAL | 0 refills | Status: DC | PRN

## 2022-10-16 MED ORDER — TRESIBA FLEXTOUCH 100 UNIT/ML ~~LOC~~ SOPN: 40.0000 [IU] | PEN_INJECTOR | Freq: Every day | SUBCUTANEOUS | 1 refills | Status: AC

## 2022-10-16 MED ORDER — AZITHROMYCIN 250 MG PO TABS: ORAL_TABLET | ORAL | 0 refills | Status: DC

## 2022-10-16 MED ORDER — ALBUTEROL SULFATE HFA 108 (90 BASE) MCG/ACT IN AERS: 2.0000 | INHALATION_SPRAY | Freq: Four times a day (QID) | RESPIRATORY_TRACT | 0 refills | Status: DC | PRN

## 2022-10-16 NOTE — Assessment & Plan Note (Signed)
A1c uncontrolled at 11.4.  She has not been consistently checking her blood sugar at home.  Does report good compliance with daily Tresiba 38 units daily.  She does frequently forget to take her mealtime insulin 8 units 3 times daily with meals.  We reiterated the importance of checking blood sugar routinely to help with adjustment for her insulin regimen.  She is not having any symptomatic lows.  We will increase her Evaristo Bury to 40 units daily.  She will try to check her blood sugar more at home and follow-up with [place] in a few weeks via MyChart so that we can adjust her insulin regimen.  She will come back to see me in 3 months to recheck her A1c.

## 2022-10-16 NOTE — Assessment & Plan Note (Signed)
Blood pressure at goal on metoprolol succinate 12.5 mg daily.

## 2022-10-16 NOTE — Patient Instructions (Signed)
It was very nice to see you today!  I think you probably have bronchitis.  Please start the cough medication.  Use the inhaler as needed.  Start the antibiotic if not improving in the next few days.  Your blood sugar is stable but still not controlled.  Please increase your Evaristo Bury to 40 units daily.  Please check your blood sugar 3-4 times daily and send me a message in a few weeks to let me know how they are looking.    We will see you back in 3 months to recheck your A1c.  Please come back to see [place] sooner if needed.  Take care, Dr Jimmey Ralph  PLEASE NOTE:  If you had any lab tests please let us know if you have not heard back within a few days. You may see your results on mychart before we have a chance to review them but we will give you a call once they are reviewed by [place]. If we ordered any referrals today, please let us know if you have not heard from their office within the next week.   Please try these tips to maintain a healthy lifestyle:  Eat at least 3 REAL meals and 1-2 snacks per day.  Aim for no more than 5 hours between eating.  If you eat breakfast, please do so within one hour of getting up.   Each meal should contain half fruits/vegetables, one quarter protein, and one quarter carbs (no bigger than a computer mouse)  Cut down on sweet beverages. This includes juice, soda, and sweet tea.   Drink at least 1 glass of water with each meal and aim for at least 8 glasses per day  Exercise at least 150 minutes every week.

## 2022-10-16 NOTE — Telephone Encounter (Signed)
Caller states: - She was calling to get an update on request    I informed caller of Stella's message about faxing the form on 11/27. Caller states she hasn't gotten this and wanted to verify the fax # it was sent to.  States she will re-fax form that has accurate fax number on it.

## 2022-10-16 NOTE — Progress Notes (Signed)
   [name] is a 71 y.o. female who presents today for an office visit.  Assessment/Plan:  New/Acute Problems: Cough Has significant smoking history and likely has some underlying COPD.  She does have some rhonchi on exam today but otherwise lung exam is stable without any signs of respiratory distress.  We will send in prescription for albuterol inhaler and Tessalon.  We will also send pocket prescription for azithromycin with instruction to not start unless symptoms are not improving over the next several days.  If continues to have issues with this would consider referral to pulmonology for PFTs.  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  She has not been consistently checking her blood sugar at home.  Does report good compliance with daily Tresiba 38 units daily.  She does frequently forget to take her mealtime insulin 8 units 3 times daily with meals.  We reiterated the importance of checking blood sugar routinely to help with adjustment for her insulin regimen.  She is not having any symptomatic lows.  We will increase her Evaristo Bury to 40 units daily.  She will try to check her blood sugar more at home and follow-up with [place] in a few weeks via MyChart so that we can adjust her insulin regimen.  She will come back to see me in 3 months to recheck her A1c.  Hypertension associated with diabetes (HCC) Blood pressure at goal on metoprolol succinate 12.5 mg daily.     Subjective:  HPI:  See A/p for status of chronic conditions.  Over the last 3-4 days she has had more issues with couch and congestion.  Partner recently sick with similar symptoms. No fevers or chills. Not coughing anything up. Occasional shortness of breath.   She has not been consistent with taking her blood sugar. She has been compliant with tresiba 38 units daily.  She frequently does not take her mealtime insulin.  No  symptomatic lows.       Objective:  Physical Exam: BP 118/75   Pulse 68   Temp 97.7 F (36.5 C) (Temporal)   Ht 5\' 4"  (1.626 m)   Wt 152 lb 6.4 oz (69.1 kg)   SpO2 98%   BMI 26.16 kg/m   Gen: No acute distress, resting comfortably HEENT: TMs clear.  OP erythematous.  Nose mucosa with clear discharge CV: Regular rate and rhythm with no murmurs appreciated Pulm: Normal work of breathing, occasional rhonchi in bilateral lung bases. Neuro: Grossly normal, moves all extremities Psych: Normal affect and thought content      [name] M. , MD 10/16/2022 11:55 AM

## 2022-10-19 NOTE — Telephone Encounter (Signed)
[name] with [facility] states Fax received 10/19/22 is missing most recent Diabetic Progress Notes and requests the above be completed (Progress Notes) and be faxed to [phone]

## 2022-10-19 NOTE — Telephone Encounter (Signed)
Form re faxed at [phone]

## 2022-10-24 ENCOUNTER — Telehealth: Payer: Self-pay | Admitting: Family Medicine

## 2022-10-24 NOTE — Telephone Encounter (Signed)
Jamilah with [facility] states she is calling to clarify an order. She is requesting an updated order. Please call [phone] ext. [phone]

## 2022-10-26 ENCOUNTER — Telehealth: Payer: Self-pay | Admitting: Family Medicine

## 2022-10-26 NOTE — Telephone Encounter (Signed)
Spoke with patient, patient stated has not order any medical supply  Call [phone] no answer

## 2022-10-26 NOTE — Telephone Encounter (Signed)
See previews note 

## 2022-10-30 ENCOUNTER — Ambulatory Visit (INDEPENDENT_AMBULATORY_CARE_PROVIDER_SITE_OTHER): Payer: Medicare Other | Admitting: Internal Medicine

## 2022-10-30 ENCOUNTER — Ambulatory Visit: Payer: Medicare Other | Admitting: Family Medicine

## 2022-10-30 ENCOUNTER — Encounter: Payer: Self-pay | Admitting: Internal Medicine

## 2022-10-30 VITALS — BP 130/80 | HR 88 | Temp 97.8°F | Wt 155.7 lb

## 2022-10-30 DIAGNOSIS — J020 Streptococcal pharyngitis: Secondary | ICD-10-CM | POA: Diagnosis not present

## 2022-10-30 DIAGNOSIS — J029 Acute pharyngitis, unspecified: Secondary | ICD-10-CM

## 2022-10-30 LAB — POCT RAPID STREP A (OFFICE): Rapid Strep A Screen: POSITIVE — AB

## 2022-10-30 MED ORDER — CLARITHROMYCIN 250 MG PO TABS: 250.0000 mg | ORAL_TABLET | Freq: Two times a day (BID) | ORAL | 0 refills | Status: AC

## 2022-10-30 NOTE — Progress Notes (Signed)
Acute office Visit     CC/Reason for Visit: Sore throat, cough  HPI: [name] is a 71 y.o. female who is coming in today for the above mentioned reasons.  She has been experiencing a cough and a sore throat for about a week now.  About 2 weeks ago she presented to her PCP with a cough and was treated with symptomatic management, Tessalon and albuterol.  She was later prescribed a Z-Pak which she took with some improvement but not complete resolution of symptoms.  The sore throat has gotten worse and the cough has become persistent.   Past Medical/Surgical History: Past Medical History:  Diagnosis Date   Allergy    Depression    Diabetes mellitus without complication ([place])    Hyperlipidemia    Hypertension    Mixed stress and urge urinary incontinence    Osteoporosis    Pneumonia    Schizophrenia ([place])    Stroke ([place])     No past surgical history on file.  Social History:  reports that she has quit smoking. Her smoking use included cigarettes. She smoked an average of 3 packs per day. She has never used smokeless tobacco. She reports current alcohol use of about 6.0 standard drinks of alcohol per week. She reports that she does not use drugs.  Allergies: Allergies  Allergen Reactions   Penicillins Rash and Hives    Did it involve swelling of the face/tongue/throat, SOB, or low BP? Unknown Did it involve sudden or severe rash/hives, skin peeling, or any reaction on the inside of your mouth or nose? Unknown Did you need to seek medical attention at a hospital or doctor's office? Unknown When did it last happen?       If all above answers are "NO", may proceed with cephalosporin use.  Did it involve swelling of the face/tongue/throat, SOB, or low BP? Unknown Did it involve sudden or severe rash/hives, skin peeling, or any reaction on the inside of your mouth or nose? Unknown Did you need to seek medical attention at a hospital or doctor's office? Unknown When  did it last happen?       If all above answers are "NO", may proceed with cephalosporin use.    Family History:  Family History  Problem Relation Age of Onset   Heart disease Mother    Hypertension Father    Stroke Father    Heart disease Father    Diabetes Father    Emphysema Paternal Grandfather      Current Outpatient Medications:    albuterol (VENTOLIN HFA) 108 (90 Base) MCG/ACT inhaler, Inhale 2 puffs into the lungs every 6 (six) hours as needed for wheezing or shortness of breath., Disp: 8 g, Rfl: 0   ARIPiprazole (ABILIFY) 30 MG tablet, Take 30 mg by mouth at bedtime., Disp: , Rfl:    aspirin 81 MG EC tablet, Take 81 mg by mouth at bedtime., Disp: , Rfl:    atorvastatin (LIPITOR) 20 MG tablet, Take 20 mg by mouth daily., Disp: , Rfl:    benztropine (COGENTIN) 1 MG tablet, Take 1 mg by mouth at bedtime. , Disp: , Rfl:    Blood Glucose Monitoring Suppl (ONETOUCH VERIO) w/Device KIT, by Does not apply route., Disp: , Rfl:    buPROPion (WELLBUTRIN) 75 MG tablet, Take 75 mg by mouth 2 (two) times daily., Disp: , Rfl:    Cholecalciferol 50 MCG (2000 UT) CAPS, Take by mouth., Disp: , Rfl:  clarithromycin (BIAXIN) 250 MG tablet, Take 1 tablet (250 mg total) by mouth 2 (two) times daily for 10 days., Disp: 20 tablet, Rfl: 0   clotrimazole-betamethasone (LOTRISONE) cream, Apply 1 Application topically daily., Disp: 30 g, Rfl: 0   Continuous Blood Gluc Receiver (DEXCOM G6 RECEIVER) DEVI, Use as directed for continuous glucose monitoring., Disp: , Rfl:    Continuous Blood Gluc Sensor (FREESTYLE LIBRE 2 SENSOR) MISC, 1 each by Does not apply route every 14 (fourteen) days., Disp: 7 each, Rfl: 1   haloperidol (HALDOL) 1 MG tablet, Take 1 mg by mouth daily., Disp: , Rfl:    hydrOXYzine (ATARAX) 25 MG tablet, Take 25-50 mg by mouth at bedtime as needed., Disp: , Rfl:    insulin aspart (FIASP) 100 UNIT/ML FlexTouch Pen, Inject 8 Units into the skin 3 (three) times daily before meals., Disp: 15  mL, Rfl: 0   metoprolol succinate (TOPROL-XL) 25 MG 24 hr tablet, Take 0.5 tablets (12.5 mg total) by mouth daily., Disp: 90 tablet, Rfl: 1   mirtazapine (REMERON) 30 MG tablet, Take 30 mg by mouth at bedtime. , Disp: , Rfl:    OYSCO 500 500 MG TABS, Take 1 tablet (1,250 mg total) by mouth daily., Disp: 90 tablet, Rfl: 1   TRESIBA FLEXTOUCH 100 UNIT/ML FlexTouch Pen, Inject 40 Units into the skin daily., Disp: 6 mL, Rfl: 1   potassium chloride (KLOR-CON) 10 MEQ tablet, Take 2 tablets (20 mEq total) by mouth daily for 15 days., Disp: 30 tablet, Rfl: 0  Review of Systems:  Constitutional: Denies fever, chills, diaphoresis, appetite change. HEENT: Denies photophobia, eye pain, redness,  mouth sores, trouble swallowing, neck pain, neck stiffness and tinnitus.   Respiratory: Denies chest tightness,  and wheezing.   Cardiovascular: Denies chest pain, palpitations and leg swelling.  Gastrointestinal: Denies nausea, vomiting, abdominal pain, diarrhea, constipation, blood in stool and abdominal distention.  Genitourinary: Denies dysuria, urgency, frequency, hematuria, flank pain and difficulty urinating.  Endocrine: Denies: hot or cold intolerance, sweats, changes in hair or nails, polyuria, polydipsia. Musculoskeletal: Denies myalgias, back pain, joint swelling, arthralgias and gait problem.  Skin: Denies pallor, rash and wound.  Neurological: Denies dizziness, seizures, syncope, weakness, light-headedness, numbness and headaches.  Hematological: Denies adenopathy. Easy bruising, personal or family bleeding history  Psychiatric/Behavioral: Denies suicidal ideation, mood changes, confusion, nervousness, sleep disturbance and agitation    Physical Exam: Vitals:   10/30/22 1635  BP: 130/80  Pulse: 88  Temp: 97.8 F (36.6 C)  SpO2: 99%  Weight: 155 lb 11.2 oz (70.6 kg)    Body mass index is 26.73 kg/m.    Constitutional: NAD, calm, comfortable Eyes: PERRL, lids and conjunctivae  normal ENMT: Mucous membranes are moist. Posterior pharynx is erythematous with tonsillar exudates.  Respiratory: clear to auscultation bilaterally, no wheezing, no crackles. Normal respiratory effort. No accessory muscle use.  Cardiovascular: Regular rate and rhythm, no murmurs / rubs / gallops. No extremity edema.    Impression and Plan:  Strep pharyngitis - Plan: clarithromycin (BIAXIN) 250 MG tablet  Sore throat - Plan: POC Rapid Strep A  -In office strep test is positive which is not surprising given her pharyngeal exam and her symptoms. -She has hives with penicillin so I have elected to treat her with clarithromycin 250 mg twice daily for 10 days.  She will follow-up with [place] if she fails to improve following treatment.  Time spent:30 minutes reviewing chart, interviewing and examining patient and formulating plan of care.  Tonimarie Gritz Hernandez Acosta, MD [facility] [place]   

## 2022-11-26 ENCOUNTER — Other Ambulatory Visit: Payer: Self-pay

## 2022-11-26 ENCOUNTER — Other Ambulatory Visit (HOSPITAL_BASED_OUTPATIENT_CLINIC_OR_DEPARTMENT_OTHER): Payer: Self-pay

## 2022-11-26 ENCOUNTER — Emergency Department (HOSPITAL_BASED_OUTPATIENT_CLINIC_OR_DEPARTMENT_OTHER)
Admission: EM | Admit: 2022-11-26 | Discharge: 2022-11-26 | Disposition: A | Discharge status: Home / Self Care | Payer: Medicare Other | Attending: Emergency Medicine | Admitting: Emergency Medicine

## 2022-11-26 ENCOUNTER — Ambulatory Visit: Payer: Medicare Other | Admitting: Family Medicine

## 2022-11-26 DIAGNOSIS — Z794 Long term (current) use of insulin: Secondary | ICD-10-CM | POA: Insufficient documentation

## 2022-11-26 DIAGNOSIS — I10 Essential (primary) hypertension: Secondary | ICD-10-CM | POA: Diagnosis not present

## 2022-11-26 DIAGNOSIS — E119 Type 2 diabetes mellitus without complications: Secondary | ICD-10-CM | POA: Insufficient documentation

## 2022-11-26 DIAGNOSIS — Z79899 Other long term (current) drug therapy: Secondary | ICD-10-CM | POA: Diagnosis not present

## 2022-11-26 DIAGNOSIS — Z7982 Long term (current) use of aspirin: Secondary | ICD-10-CM | POA: Insufficient documentation

## 2022-11-26 DIAGNOSIS — Z87891 Personal history of nicotine dependence: Secondary | ICD-10-CM | POA: Insufficient documentation

## 2022-11-26 DIAGNOSIS — M79641 Pain in right hand: Secondary | ICD-10-CM | POA: Diagnosis present

## 2022-11-26 DIAGNOSIS — Z8673 Personal history of transient ischemic attack (TIA), and cerebral infarction without residual deficits: Secondary | ICD-10-CM | POA: Diagnosis not present

## 2022-11-26 DIAGNOSIS — B369 Superficial mycosis, unspecified: Secondary | ICD-10-CM | POA: Diagnosis not present

## 2022-11-26 MED ORDER — CLOTRIMAZOLE 1 % EX CREA
TOPICAL_CREAM | CUTANEOUS | 1 refills | Status: DC
  Filled 2022-11-26: qty 15, fill #0

## 2022-11-26 NOTE — Progress Notes (Deleted)
ACUTE VISIT No chief complaint on file.  HPI: [name] is a 72 y.o. female, who is here today complaining of *** HPI  Review of Systems See other pertinent positives and negatives in HPI.  Current Outpatient Medications on File Prior to Visit  Medication Sig Dispense Refill   albuterol (VENTOLIN HFA) 108 (90 Base) MCG/ACT inhaler Inhale 2 puffs into the lungs every 6 (six) hours as needed for wheezing or shortness of breath. 8 g 0   ARIPiprazole (ABILIFY) 30 MG tablet Take 30 mg by mouth at bedtime.     aspirin 81 MG EC tablet Take 81 mg by mouth at bedtime.     atorvastatin (LIPITOR) 20 MG tablet Take 20 mg by mouth daily.     benztropine (COGENTIN) 1 MG tablet Take 1 mg by mouth at bedtime.      Blood Glucose Monitoring Suppl (ONETOUCH VERIO) w/Device KIT by Does not apply route.     buPROPion (WELLBUTRIN) 75 MG tablet Take 75 mg by mouth 2 (two) times daily.     Cholecalciferol 50 MCG (2000 UT) CAPS Take by mouth.     clotrimazole-betamethasone (LOTRISONE) cream Apply 1 Application topically daily. 30 g 0   Continuous Blood Gluc Receiver (DEXCOM G6 RECEIVER) DEVI Use as directed for continuous glucose monitoring.     Continuous Blood Gluc Sensor (FREESTYLE LIBRE 2 SENSOR) MISC 1 each by Does not apply route every 14 (fourteen) days. 7 each 1   haloperidol (HALDOL) 1 MG tablet Take 1 mg by mouth daily.     hydrOXYzine (ATARAX) 25 MG tablet Take 25-50 mg by mouth at bedtime as needed.     insulin aspart (FIASP) 100 UNIT/ML FlexTouch Pen Inject 8 Units into the skin 3 (three) times daily before meals. 15 mL 0   metoprolol succinate (TOPROL-XL) 25 MG 24 hr tablet Take 0.5 tablets (12.5 mg total) by mouth daily. 90 tablet 1   mirtazapine (REMERON) 30 MG tablet Take 30 mg by mouth at bedtime.      OYSCO 500 500 MG TABS Take 1 tablet (1,250 mg total) by mouth daily. 90 tablet 1   potassium chloride (KLOR-CON) 10 MEQ tablet Take 2 tablets (20 mEq total) by mouth daily for 15  days. 30 tablet 0   No current facility-administered medications on file prior to visit.    Past Medical History:  Diagnosis Date   Allergy    Depression    Diabetes mellitus without complication ([place])    Hyperlipidemia    Hypertension    Mixed stress and urge urinary incontinence    Osteoporosis    Pneumonia    Schizophrenia ([place])    Stroke (HCC)    Allergies  Allergen Reactions   Penicillins Rash and Hives    Did it involve swelling of the face/tongue/throat, SOB, or low BP? Unknown Did it involve sudden or severe rash/hives, skin peeling, or any reaction on the inside of your mouth or nose? Unknown Did you need to seek medical attention at a hospital or doctor's office? Unknown When did it last happen?       If all above answers are "NO", may proceed with cephalosporin use.  Did it involve swelling of the face/tongue/throat, SOB, or low BP? Unknown Did it involve sudden or severe rash/hives, skin peeling, or any reaction on the inside of your mouth or nose? Unknown Did you need to seek medical attention at a hospital or doctor's office? Unknown When did it last happen?  If all above answers are "NO", may proceed with cephalosporin use.    Social History   Socioeconomic History   Marital status: Single    Spouse name: Not on file   Number of children: Not on file   Years of education: Not on file   Highest education level: Not on file  Occupational History   Not on file  Tobacco Use   Smoking status: Former    Packs/day: 3.00    Types: Cigarettes   Smokeless tobacco: Never  Substance and Sexual Activity   Alcohol use: Yes    Alcohol/week: 6.0 standard drinks of alcohol    Types: 6 Cans of beer per week   Drug use: No   Sexual activity: Not on file  Other Topics Concern   Not on file  Social History Narrative   Not on file   Social Determinants of Health   Financial Resource Strain: Not on file  Food Insecurity: Not on file  Transportation Needs:  Not on file  Physical Activity: Not on file  Stress: Not on file  Social Connections: Not on file    There were no vitals filed for this visit. There is no height or weight on file to calculate BMI.  Physical Exam  ASSESSMENT AND PLAN: There are no diagnoses linked to this encounter.  No follow-ups on file.  Abigail G. [place], MD  [facility]. [facility].  Discharge Instructions   None

## 2022-11-26 NOTE — Discharge Instructions (Signed)
Right hand skin lesion suggestive of fungal infection.  Apply the Lotrimin as directed.  Make an appointment follow-up with your primary care doctor to make sure things improve.  Return for any new or worse symptoms.  If things do not improve may require follow-up with dermatology.  Based on your age skin cancers always of potential concern.

## 2022-11-26 NOTE — ED Notes (Signed)
Pt verbalized understanding of d/c instructions, meds, and followup care. Denies questions. VSS, no distress noted. Steady gait to exit with all belongings.  ?

## 2022-11-26 NOTE — ED Provider Notes (Addendum)
[facility] EMERGENCY DEPT Provider Note   CSN: [id number] Arrival date & time: 11/26/22  1516     History  Chief Complaint  Patient presents with   Hand Pain    [name] is a 72 y.o. female.  Patient with concern for skin lesion to the back of her right hand.  States has been present for a few days.  Currently being treated for fungal infection skin lesions.  Patient had a prescription in the system from August for combination of antifungal and steroid cream.  Patient denies any other significant symptoms.  Past medical history significant for diabetes without complications.  History of stroke hyperlipidemia hypertension.  Former smoker 3 packs a day.  Patient's blood pressure is elevated on presentation here 169/85.       Home Medications Prior to Admission medications   Medication Sig Start Date End Date Taking? Authorizing Provider  clotrimazole (LOTRIMIN) 1 % cream Apply to affected area 2 times daily 11/26/22  Yes Vanetta Mulders, MD  albuterol (VENTOLIN HFA) 108 (90 Base) MCG/ACT inhaler Inhale 2 puffs into the lungs every 6 (six) hours as needed for wheezing or shortness of breath. 10/16/22   Ardith Dark, MD  ARIPiprazole (ABILIFY) 30 MG tablet Take 30 mg by mouth at bedtime. 11/10/20   [provider]  aspirin 81 MG EC tablet Take 81 mg by mouth at bedtime.    [provider]  atorvastatin (LIPITOR) 20 MG tablet Take 20 mg by mouth daily. 11/09/19   [provider]  benztropine (COGENTIN) 1 MG tablet Take 1 mg by mouth at bedtime.     [provider]  Blood Glucose Monitoring Suppl (ONETOUCH VERIO) w/Device KIT by Does not apply route. 01/24/21   [provider]  buPROPion (WELLBUTRIN) 75 MG tablet Take 75 mg by mouth 2 (two) times daily.    [provider]  Cholecalciferol 50 MCG (2000 UT) CAPS Take by mouth.    [provider]  clotrimazole-betamethasone (LOTRISONE) cream Apply 1  Application topically daily. 06/21/22   Janeece Agee, NP  Continuous Blood Gluc Receiver (DEXCOM G6 RECEIVER) DEVI Use as directed for continuous glucose monitoring. 05/09/21   [provider]  Continuous Blood Gluc Sensor (FREESTYLE LIBRE 2 SENSOR) MISC 1 each by Does not apply route every 14 (fourteen) days. 03/01/22   Janeece Agee, NP  haloperidol (HALDOL) 1 MG tablet Take 1 mg by mouth daily. 03/23/22   [provider]  hydrOXYzine (ATARAX) 25 MG tablet Take 25-50 mg by mouth at bedtime as needed. 03/23/22   [provider]  insulin aspart (FIASP) 100 UNIT/ML FlexTouch Pen Inject 8 Units into the skin 3 (three) times daily before meals. 09/28/22   Ardith Dark, MD  metoprolol succinate (TOPROL-XL) 25 MG 24 hr tablet Take 0.5 tablets (12.5 mg total) by mouth daily. 01/09/22   Janeece Agee, NP  mirtazapine (REMERON) 30 MG tablet Take 30 mg by mouth at bedtime.     [provider]  OYSCO 500 500 MG TABS Take 1 tablet (1,250 mg total) by mouth daily. 01/03/22   Janeece Agee, NP  potassium chloride (KLOR-CON) 10 MEQ tablet Take 2 tablets (20 mEq total) by mouth daily for 15 days. 11/30/20 08/16/22  Vassie Loll, MD      Allergies    Penicillins    Review of Systems   Review of Systems  Constitutional:  Negative for chills and fever.  HENT:  Negative for ear pain and sore  throat.   Eyes:  Negative for pain and visual disturbance.  Respiratory:  Negative for cough and shortness of breath.   Cardiovascular:  Negative for chest pain and palpitations.  Gastrointestinal:  Negative for abdominal pain and vomiting.  Genitourinary:  Negative for dysuria and hematuria.  Musculoskeletal:  Negative for arthralgias and back pain.  Skin:  Positive for rash. Negative for color change.  Neurological:  Negative for seizures and syncope.  All other systems reviewed and are negative.   Physical Exam Updated Vital Signs BP (!) 169/85   Pulse 75   Temp 98.1 F  (36.7 C) (Oral)   Resp 16   Ht 1.651 m (5\' 5" )   Wt 65.8 kg   SpO2 99%   BMI 24.13 kg/m  Physical Exam Vitals and nursing note reviewed.  Constitutional:      General: She is not in acute distress.    Appearance: Normal appearance. She is well-developed.  HENT:     Head: Normocephalic and atraumatic.  Eyes:     Extraocular Movements: Extraocular movements intact.     Conjunctiva/sclera: Conjunctivae normal.     Pupils: Pupils are equal, round, and reactive to light.  Cardiovascular:     Rate and Rhythm: Normal rate and regular rhythm.     Heart sounds: No murmur heard. Pulmonary:     Effort: Pulmonary effort is normal. No respiratory distress.     Breath sounds: Normal breath sounds.  Abdominal:     Palpations: Abdomen is soft.     Tenderness: There is no abdominal tenderness.  Musculoskeletal:        General: No swelling.     Cervical back: Normal range of motion and neck supple. No rigidity.  Skin:    General: Skin is warm and dry.     Capillary Refill: Capillary refill takes less than 2 seconds.     Findings: Lesion and rash present.     Comments: Circular skin lesion to the back of the hand suggestive of possible ringworm.  Central sparing.  But there is a scab in the center area.  Not a lot of significant erythema no induration no fluctuance.  No streaking of erythema.  Nontender to palpation.  Also similar lesion although not as definitive to the right base of the neck.  Neurological:     General: No focal deficit present.     Mental Status: She is alert and oriented to person, place, and time.  Psychiatric:        Mood and Affect: Mood normal.     ED Results / Procedures / Treatments   Labs (all labs ordered are listed, but only abnormal results are displayed) Labs Reviewed - No data to display  EKG None  Radiology No results found.  Procedures Procedures    Medications Ordered in ED Medications - No data to display  ED Course/ Medical Decision  Making/ A&P                           Medical Decision Making  Skin lesion on the back of the hand particular with the spherical nature of it suggestive of possible ringworm.  But patient is age always raises concerns for possible skin cancer.  Will treat with Lotrimin cream have her follow-up with primary care doctor.  May need referral to dermatology for further evaluation of these things do not improve.  Patient nontoxic no acute distress.  Blood pressure is somewhat elevated.  Known history of hypertension also recommending follow-up with primary care provider.  Final Clinical Impression(s) / ED Diagnoses Final diagnoses:  Fungal infection of skin  Primary hypertension    Rx / DC Orders ED Discharge Orders          Ordered    clotrimazole (LOTRIMIN) 1 % cream        11/26/22 1549              Fredia Sorrow, MD 11/26/22 1554    Fredia Sorrow, MD 11/26/22 1554

## 2022-11-26 NOTE — ED Triage Notes (Addendum)
Arrives POV ambulatory. Aox4. Painful hands-no rash or itching/no respiratory complaints- has one scab on hand she points to in concern. Currently being treated for ringworm on right leg.  Only known allergy is penicillin.

## 2022-12-01 ENCOUNTER — Other Ambulatory Visit: Payer: Self-pay | Admitting: Family Medicine

## 2022-12-01 DIAGNOSIS — E1122 Type 2 diabetes mellitus with diabetic chronic kidney disease: Secondary | ICD-10-CM

## 2022-12-02 ENCOUNTER — Other Ambulatory Visit: Payer: Self-pay | Admitting: Family Medicine

## 2022-12-05 ENCOUNTER — Ambulatory Visit: Payer: Medicare Other | Admitting: Family Medicine

## 2022-12-18 ENCOUNTER — Encounter: Payer: Self-pay | Admitting: Family

## 2022-12-18 ENCOUNTER — Ambulatory Visit (INDEPENDENT_AMBULATORY_CARE_PROVIDER_SITE_OTHER): Payer: Medicare Other | Admitting: Family

## 2022-12-18 VITALS — Temp 97.2°F | Ht 65.0 in | Wt 159.2 lb

## 2022-12-18 DIAGNOSIS — B354 Tinea corporis: Secondary | ICD-10-CM | POA: Diagnosis not present

## 2022-12-18 MED ORDER — TERBINAFINE HCL 250 MG PO TABS: 250.0000 mg | ORAL_TABLET | Freq: Every day | ORAL | 0 refills | Status: DC

## 2022-12-18 MED ORDER — TERBINAFINE HCL 1 % EX CREA: 1.0000 | TOPICAL_CREAM | Freq: Two times a day (BID) | CUTANEOUS | 0 refills | Status: AC

## 2022-12-18 NOTE — Progress Notes (Signed)
Patient ID: [name], female    DOB: 04-21-1951, 72 y.o.   MRN: [id number]  Chief Complaint  Patient presents with   Rash    sx for months    HPI:      Skin rash:  Pt c/o rash on her right hand, neck and bilateral thighs. Has tried antifungal cream given to her by PCP months ago, but has not been helping. Pt c/o of rash as burning, itchy and redness.       Assessment & Plan:  1. Tinea corporis - treating for ringworm, believe with continued scratching rash on her hand has changed in shape, appears inflamed, right upper chest appears as true ringworm. Sending Terbinafine cream and pills. Advised on use & SE.  - terbinafine (LAMISIL) 1 % cream; Apply 1 Application topically 2 (two) times daily for 14 days. Apply for 2 weeks, ok to stop sooner if rash resolved.  Dispense: 30 g; Refill: 0 - terbinafine (LAMISIL) 250 MG tablet; Take 1 tablet (250 mg total) by mouth daily.  Dispense: 7 tablet; Refill: 0   Subjective:    Outpatient Medications Prior to Visit  Medication Sig Dispense Refill   albuterol (VENTOLIN HFA) 108 (90 Base) MCG/ACT inhaler TAKE 2 PUFFS BY MOUTH EVERY 6 HOURS AS NEEDED FOR WHEEZE OR SHORTNESS OF BREATH 18 each 0   ARIPiprazole (ABILIFY) 30 MG tablet Take 30 mg by mouth at bedtime.     aspirin 81 MG EC tablet Take 81 mg by mouth at bedtime.     atorvastatin (LIPITOR) 20 MG tablet Take 20 mg by mouth daily.     benztropine (COGENTIN) 1 MG tablet Take 1 mg by mouth at bedtime.      Blood Glucose Monitoring Suppl (ONETOUCH VERIO) w/Device KIT by Does not apply route.     buPROPion (WELLBUTRIN) 75 MG tablet Take 75 mg by mouth 2 (two) times daily.     Cholecalciferol 50 MCG (2000 UT) CAPS Take by mouth.     clotrimazole (LOTRIMIN) 1 % cream Apply to affected area 2 times daily 15 g 1   clotrimazole-betamethasone (LOTRISONE) cream Apply 1 Application topically daily. 30 g 0   Continuous Blood Gluc Receiver (DEXCOM G6 RECEIVER) DEVI Use as directed for  continuous glucose monitoring.     Continuous Blood Gluc Sensor (FREESTYLE LIBRE 2 SENSOR) MISC 1 each by Does not apply route every 14 (fourteen) days. 7 each 1   haloperidol (HALDOL) 1 MG tablet Take 1 mg by mouth daily.     hydrOXYzine (ATARAX) 25 MG tablet Take 25-50 mg by mouth at bedtime as needed.     insulin aspart (FIASP FLEXTOUCH) 100 UNIT/ML FlexTouch Pen INJECT 8 UNITS INTO THE SKIN 3 (THREE) TIMES DAILY BEFORE MEALS. 15 mL 0   metoprolol succinate (TOPROL-XL) 25 MG 24 hr tablet Take 0.5 tablets (12.5 mg total) by mouth daily. 90 tablet 1   mirtazapine (REMERON) 30 MG tablet Take 30 mg by mouth at bedtime.      OYSCO 500 500 MG TABS Take 1 tablet (1,250 mg total) by mouth daily. 90 tablet 1   potassium chloride (KLOR-CON) 10 MEQ tablet Take 2 tablets (20 mEq total) by mouth daily for 15 days. 30 tablet 0   No facility-administered medications prior to visit.   Past Medical History:  Diagnosis Date   Allergy    Depression    Diabetes mellitus without complication (HCC)    Hyperlipidemia    Hypertension    Mixed  stress and urge urinary incontinence    Osteoporosis    Pneumonia    Schizophrenia ([place])    Stroke ([place])    No past surgical history on file. Allergies  Allergen Reactions   Penicillins Rash and Hives    Did it involve swelling of the face/tongue/throat, SOB, or low BP? Unknown Did it involve sudden or severe rash/hives, skin peeling, or any reaction on the inside of your mouth or nose? Unknown Did you need to seek medical attention at a hospital or doctor's office? Unknown When did it last happen?       If all above answers are "NO", may proceed with cephalosporin use.  Did it involve swelling of the face/tongue/throat, SOB, or low BP? Unknown Did it involve sudden or severe rash/hives, skin peeling, or any reaction on the inside of your mouth or nose? Unknown Did you need to seek medical attention at a hospital or doctor's office? Unknown When did it last  happen?       If all above answers are "NO", may proceed with cephalosporin use.      Objective:    Physical Exam Vitals and nursing note reviewed.  Constitutional:      Appearance: Normal appearance.  Cardiovascular:     Rate and Rhythm: Normal rate and regular rhythm.  Pulmonary:     Effort: Pulmonary effort is normal.     Breath sounds: Normal breath sounds.  Musculoskeletal:        General: Normal range of motion.  Skin:    General: Skin is warm and dry.     Findings: Rash (right hand, erythematous, raised, approx 3 cm in oval diameter with very small clear center; right upper chest close to neck, 4.5cm in diameter darkened skin with pale center) present.  Neurological:     Mental Status: She is alert.  Psychiatric:        Mood and Affect: Mood normal.        Behavior: Behavior normal.    Temp (!) 97.2 F (36.2 C) (Temporal)   Ht 5\' 5"  (1.651 m)   Wt 159 lb 4 oz (72.2 kg)   BMI 26.50 kg/m  Wt Readings from Last 3 Encounters:  12/18/22 159 lb 4 oz (72.2 kg)  11/26/22 145 lb (65.8 kg)  10/30/22 155 lb 11.2 oz (70.6 kg)       Jeanie Sewer, NP

## 2023-01-14 ENCOUNTER — Encounter: Payer: Self-pay | Admitting: Family Medicine

## 2023-01-14 ENCOUNTER — Ambulatory Visit (INDEPENDENT_AMBULATORY_CARE_PROVIDER_SITE_OTHER): Payer: Medicare Other | Admitting: Family Medicine

## 2023-01-14 VITALS — BP 127/77 | HR 90 | Temp 97.8°F | Ht 65.0 in | Wt 151.4 lb

## 2023-01-14 DIAGNOSIS — Z794 Long term (current) use of insulin: Secondary | ICD-10-CM

## 2023-01-14 DIAGNOSIS — I152 Hypertension secondary to endocrine disorders: Secondary | ICD-10-CM | POA: Diagnosis not present

## 2023-01-14 DIAGNOSIS — E1022 Type 1 diabetes mellitus with diabetic chronic kidney disease: Secondary | ICD-10-CM

## 2023-01-14 LAB — POCT GLYCOSYLATED HEMOGLOBIN (HGB A1C): Hemoglobin A1C: 13.6 % — AB (ref 4.0–5.6)

## 2023-01-14 MED ORDER — FREESTYLE LIBRE 2 SENSOR MISC: 1.0000 | 1 refills | Status: DC

## 2023-01-14 MED ORDER — TRESIBA FLEXTOUCH 100 UNIT/ML ~~LOC~~ SOPN: 44.0000 [IU] | PEN_INJECTOR | Freq: Every day | SUBCUTANEOUS | 3 refills | Status: DC

## 2023-01-14 NOTE — Assessment & Plan Note (Addendum)
A1c not controlled at 13.6.  She is not checking her sugars at home and has been out of her mealtime coverage for the last couple of weeks.  We did discuss importance of routine monitoring and compliance with medication to prevent complications from diabetes especially given that she was admitted for DKA a year ago.  She does have her sister helping her at home and she declines any further assistance with medication management at this point.  We will increase her tresiba slightly to 44 units daily and will refill her mealtime insulin.  She will continue taking aspart 8 units 3 times daily with meals.  Will send a prescription for continuous glucose monitor which should hopefully help with her checking her blood sugar routinely.  Due to poor A1c control and prior admissions for DKA would be reasonable for her to follow back up with endocrinologist.  She last did this about a year ago and she has agreed to see them again.  Will place referral today.  She will come back to see me in 3 months for A1c check if she has not seen endocrinology about him.

## 2023-01-14 NOTE — Progress Notes (Signed)
   [name] is a 72 y.o. female who presents today for an office visit.  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  She is not checking her sugars at home and has been out of her mealtime coverage for the last couple of weeks.  We did discuss importance of routine monitoring and compliance with medication to prevent complications from diabetes especially given that she was admitted for DKA a year ago.  She does have her sister helping her at home and she declines any further assistance with medication management at this point.  We will increase her tresiba slightly to 44 units daily and will refill her mealtime insulin.  She will continue taking aspart 8 units 3 times daily with meals.  Will send a prescription for continuous glucose monitor which should hopefully help with her checking her blood sugar routinely.  Due to poor A1c control and prior admissions for DKA would be reasonable for her to follow back up with endocrinologist.  She last did this about a year ago and she has agreed to see them again.  Will place referral today.  She will come back to see me in 3 months for A1c check if she has not seen endocrinology about him.  Hypertension associated with diabetes ([place]) Blood pressure at goal metoprolol succinate 12.5 mg daily.     Subjective:  HPI:  See A/p for status of chronic conditions. She is here today for follow up.  We saw her 3 months ago.  A1c was not controlled at 11.1.  We increased her Tyler Aas to 40 units daily and continued her on her mealtime insulin 8 units 3 times daily.  She does tell me that she has been out of her aspart for the last 1-2 weeks ago. She has not been checking her blood sugar. Her sister has been helping with managing her medications. She has been taking her insulin daily but does know what dose. Does not feel like she has had any  symptoms lows.        Objective:  Physical Exam: BP 127/77   Pulse 90   Temp 97.8 F (36.6 C) (Temporal)   Ht 5' 5"$  (1.651 m)   Wt 151 lb 6.4 oz (68.7 kg)   SpO2 99%   BMI 25.19 kg/m   Gen: No acute distress, resting comfortably Neuro: Grossly normal, moves all extremities Psych: Normal affect and thought content      Lynden Carrithers M. Jerline Pain, MD 01/14/2023 11:38 AM

## 2023-01-14 NOTE — Assessment & Plan Note (Signed)
Blood pressure at goal metoprolol succinate 12.5 mg daily.

## 2023-01-14 NOTE — Patient Instructions (Signed)
It was very nice to see you today!  Your A1c is not controlled.  Will need to work on getting this lower.  Please increase your tresiba to 44 units daily.  Please continue using your mealtime insulin 8 units 3 times daily.  We will send in a continuous glucose monitor.  I will refer you to see the endocrinologist.  Please send me a message in a couple of weeks to let me know how your sugars are looking.  Will see you back in 3 months.  Come back sooner if needed.  Take care, Dr Jerline Pain  PLEASE NOTE:  If you had any lab tests, please let us know if you have not heard back within a few days. You may see your results on mychart before we have a chance to review them but we will give you a call once they are reviewed by [place].   If we ordered any referrals today, please let us know if you have not heard from their office within the next week.   If you had any urgent prescriptions sent in today, please check with the pharmacy within an hour of our visit to make sure the prescription was transmitted appropriately.   Please try these tips to maintain a healthy lifestyle:  Eat at least 3 REAL meals and 1-2 snacks per day.  Aim for no more than 5 hours between eating.  If you eat breakfast, please do so within one hour of getting up.   Each meal should contain half fruits/vegetables, one quarter protein, and one quarter carbs (no bigger than a computer mouse)  Cut down on sweet beverages. This includes juice, soda, and sweet tea.   Drink at least 1 glass of water with each meal and aim for at least 8 glasses per day  Exercise at least 150 minutes every week.

## 2023-02-07 ENCOUNTER — Encounter: Payer: Self-pay | Admitting: Family Medicine

## 2023-02-07 ENCOUNTER — Ambulatory Visit (INDEPENDENT_AMBULATORY_CARE_PROVIDER_SITE_OTHER)
Admission: RE | Admit: 2023-02-07 | Discharge: 2023-02-07 | Disposition: A | Discharge status: Home / Self Care | Payer: Medicare Other | Source: Ambulatory Visit | Attending: Family Medicine | Admitting: Family Medicine

## 2023-02-07 ENCOUNTER — Ambulatory Visit (INDEPENDENT_AMBULATORY_CARE_PROVIDER_SITE_OTHER): Payer: Medicare Other | Admitting: Family Medicine

## 2023-02-07 VITALS — BP 94/58 | HR 98 | Temp 97.8°F | Ht 65.0 in | Wt 143.6 lb

## 2023-02-07 DIAGNOSIS — E1022 Type 1 diabetes mellitus with diabetic chronic kidney disease: Secondary | ICD-10-CM

## 2023-02-07 DIAGNOSIS — E1059 Type 1 diabetes mellitus with other circulatory complications: Secondary | ICD-10-CM | POA: Diagnosis not present

## 2023-02-07 DIAGNOSIS — M25562 Pain in left knee: Secondary | ICD-10-CM | POA: Diagnosis not present

## 2023-02-07 DIAGNOSIS — N189 Chronic kidney disease, unspecified: Secondary | ICD-10-CM

## 2023-02-07 DIAGNOSIS — I152 Hypertension secondary to endocrine disorders: Secondary | ICD-10-CM | POA: Diagnosis not present

## 2023-02-07 DIAGNOSIS — E1159 Type 2 diabetes mellitus with other circulatory complications: Secondary | ICD-10-CM

## 2023-02-07 MED ORDER — DICLOFENAC SODIUM 75 MG PO TBEC: 75.0000 mg | DELAYED_RELEASE_TABLET | Freq: Two times a day (BID) | ORAL | 0 refills | Status: DC

## 2023-02-07 NOTE — Assessment & Plan Note (Signed)
Had lengthy discussion with patient at her last office visit regarding her A1c.  We increased her Tyler Aas to 44 units daily and continued her on her mealtime insulin and aspart 8 units 3 times daily with meals.  We referred her to endocrinology and she has not yet been able to see them.  She does admit that she has not been checking her sugars though has not had any symptomatic lows.  She will come back to see me in a couple of months to recheck A1c if she has not been able to get into see endocrinology by then. Discussed importance of routine glucose monitoring so that we can adjust her insulin as needed.

## 2023-02-07 NOTE — Progress Notes (Signed)
   [name] is a 72 y.o. female who presents today for an office visit.  Assessment/Plan:  New/Acute Problems: Left Knee Pain  Unclear mechanism of injury though sounds like she may have landed on her knee causing contusion.  It is also possible this could be a strain.  Given her recent fall we will check x-ray to rule out fracture.  We discussed importance of ice, compression, and elevation.  Will start diclofenac 75 mg twice daily for the next 1 to 2 weeks.  She will let me know if not improving.  We discussed reasons to return to care.  Chronic Problems Addressed Today: Hypertension associated with diabetes ([place]) Blood pressure at goal today on metoprolol succinate 12.5 mg daily.  Type 1 diabetes mellitus with diabetic chronic kidney disease ([place]) Had lengthy discussion with patient at her last office visit regarding her A1c.  We increased her Tyler Aas to 44 units daily and continued her on her mealtime insulin and aspart 8 units 3 times daily with meals.  We referred her to endocrinology and she has not yet been able to see them.  She does admit that she has not been checking her sugars though has not had any symptomatic lows.  She will come back to see me in a couple of months to recheck A1c if she has not been able to get into see endocrinology by then. Discussed importance of routine glucose monitoring so that we can adjust her insulin as needed.     Subjective:  HPI:  See A/p for status of chronic conditions. She is here today with left knee pain. Started 2 days ago. She fell at home.  Thinks that she may have tripped over some clothes.  Thinks that she landed on her left knee.  About the same the last few days. Tried aspirin which did not help. Able to walk. No swelling.  Worse with certain motions.  No bruising.        Objective:  Physical Exam: BP (!) 94/58   Pulse 98   Temp 97.8 F (36.6 C) (Temporal)   Ht 5\' 5"  (1.651 m)   Wt 143 lb 9.6 oz (65.1 kg)   SpO2 99%    BMI 23.90 kg/m   Gen: No acute distress, resting comfortably MSK: - Left Knee: No obvious deformities.  No ecchymosis.  Tenderness to palpation along anterior joint line and anterior patella.  Pain with active and passive range of motion.  Anterior posterior drawer signs negative.  Neurovascular intact distally. Neuro: Grossly normal, moves all extremities Psych: Normal affect and thought content      [name] M. Jerline Pain, MD 02/07/2023 2:59 PM

## 2023-02-07 NOTE — Patient Instructions (Signed)
It was very nice to see you today!  You may have bruised your knee.  Please place compression on your knee with an Ace wrap.  Use ice a few times daily.  Please keep your knee elevated.  Start diclofenac 75 mg twice daily.  Will get an x-ray today to make sure there is nothing that is broken.  Your pain should improve over the next 1 to 2 weeks.  Please let us know if anything worsens.  Take care, Dr Jerline Pain  PLEASE NOTE:  If you had any lab tests, please let us know if you have not heard back within a few days. You may see your results on mychart before we have a chance to review them but we will give you a call once they are reviewed by [place].   If we ordered any referrals today, please let us know if you have not heard from their office within the next week.   If you had any urgent prescriptions sent in today, please check with the pharmacy within an hour of our visit to make sure the prescription was transmitted appropriately.   Please try these tips to maintain a healthy lifestyle:  Eat at least 3 REAL meals and 1-2 snacks per day.  Aim for no more than 5 hours between eating.  If you eat breakfast, please do so within one hour of getting up.   Each meal should contain half fruits/vegetables, one quarter protein, and one quarter carbs (no bigger than a computer mouse)  Cut down on sweet beverages. This includes juice, soda, and sweet tea.   Drink at least 1 glass of water with each meal and aim for at least 8 glasses per day  Exercise at least 150 minutes every week.

## 2023-02-07 NOTE — Assessment & Plan Note (Signed)
Blood pressure at goal today on metoprolol succinate 12.5 mg daily.

## 2023-02-12 ENCOUNTER — Emergency Department (HOSPITAL_COMMUNITY): Payer: Medicare Other

## 2023-02-12 ENCOUNTER — Observation Stay (HOSPITAL_COMMUNITY)
Admission: EM | Admit: 2023-02-12 | Discharge: 2023-02-14 | Disposition: A | Discharge status: Home / Self Care | Payer: Medicare Other | Attending: Internal Medicine | Admitting: Internal Medicine

## 2023-02-12 ENCOUNTER — Encounter (HOSPITAL_COMMUNITY): Payer: Self-pay | Admitting: *Deleted

## 2023-02-12 ENCOUNTER — Other Ambulatory Visit: Payer: Self-pay

## 2023-02-12 DIAGNOSIS — R2681 Unsteadiness on feet: Secondary | ICD-10-CM | POA: Diagnosis not present

## 2023-02-12 DIAGNOSIS — Z794 Long term (current) use of insulin: Secondary | ICD-10-CM | POA: Insufficient documentation

## 2023-02-12 DIAGNOSIS — R7889 Finding of other specified substances, not normally found in blood: Secondary | ICD-10-CM | POA: Diagnosis present

## 2023-02-12 DIAGNOSIS — Z87891 Personal history of nicotine dependence: Secondary | ICD-10-CM | POA: Diagnosis not present

## 2023-02-12 DIAGNOSIS — Z79899 Other long term (current) drug therapy: Secondary | ICD-10-CM | POA: Insufficient documentation

## 2023-02-12 DIAGNOSIS — R079 Chest pain, unspecified: Secondary | ICD-10-CM | POA: Diagnosis present

## 2023-02-12 DIAGNOSIS — R000 Tachycardia, unspecified: Secondary | ICD-10-CM | POA: Diagnosis present

## 2023-02-12 DIAGNOSIS — E1159 Type 2 diabetes mellitus with other circulatory complications: Secondary | ICD-10-CM | POA: Diagnosis present

## 2023-02-12 DIAGNOSIS — Z8673 Personal history of transient ischemic attack (TIA), and cerebral infarction without residual deficits: Secondary | ICD-10-CM | POA: Diagnosis not present

## 2023-02-12 DIAGNOSIS — Z7982 Long term (current) use of aspirin: Secondary | ICD-10-CM | POA: Diagnosis not present

## 2023-02-12 DIAGNOSIS — R262 Difficulty in walking, not elsewhere classified: Secondary | ICD-10-CM | POA: Diagnosis not present

## 2023-02-12 DIAGNOSIS — E1010 Type 1 diabetes mellitus with ketoacidosis without coma: Principal | ICD-10-CM

## 2023-02-12 DIAGNOSIS — N189 Chronic kidney disease, unspecified: Secondary | ICD-10-CM | POA: Diagnosis not present

## 2023-02-12 DIAGNOSIS — E785 Hyperlipidemia, unspecified: Secondary | ICD-10-CM | POA: Insufficient documentation

## 2023-02-12 DIAGNOSIS — E1110 Type 2 diabetes mellitus with ketoacidosis without coma: Principal | ICD-10-CM | POA: Diagnosis present

## 2023-02-12 DIAGNOSIS — I129 Hypertensive chronic kidney disease with stage 1 through stage 4 chronic kidney disease, or unspecified chronic kidney disease: Secondary | ICD-10-CM | POA: Insufficient documentation

## 2023-02-12 DIAGNOSIS — E7132 Disorders of ketone metabolism: Secondary | ICD-10-CM | POA: Diagnosis not present

## 2023-02-12 DIAGNOSIS — R7989 Other specified abnormal findings of blood chemistry: Secondary | ICD-10-CM | POA: Diagnosis present

## 2023-02-12 DIAGNOSIS — E1122 Type 2 diabetes mellitus with diabetic chronic kidney disease: Secondary | ICD-10-CM

## 2023-02-12 DIAGNOSIS — E46 Unspecified protein-calorie malnutrition: Secondary | ICD-10-CM | POA: Diagnosis not present

## 2023-02-12 DIAGNOSIS — H6692 Otitis media, unspecified, left ear: Secondary | ICD-10-CM | POA: Insufficient documentation

## 2023-02-12 DIAGNOSIS — N179 Acute kidney failure, unspecified: Secondary | ICD-10-CM | POA: Diagnosis present

## 2023-02-12 DIAGNOSIS — R4182 Altered mental status, unspecified: Secondary | ICD-10-CM | POA: Insufficient documentation

## 2023-02-12 DIAGNOSIS — E1022 Type 1 diabetes mellitus with diabetic chronic kidney disease: Secondary | ICD-10-CM | POA: Diagnosis present

## 2023-02-12 DIAGNOSIS — F259 Schizoaffective disorder, unspecified: Secondary | ICD-10-CM | POA: Diagnosis present

## 2023-02-12 DIAGNOSIS — E871 Hypo-osmolality and hyponatremia: Secondary | ICD-10-CM | POA: Diagnosis not present

## 2023-02-12 DIAGNOSIS — E1169 Type 2 diabetes mellitus with other specified complication: Secondary | ICD-10-CM | POA: Diagnosis present

## 2023-02-12 NOTE — ED Notes (Signed)
Attempted labs without success 

## 2023-02-12 NOTE — ED Triage Notes (Signed)
Pt c/o centralized chest pain, nonradiating. Denies sob, currently denies any cp

## 2023-02-12 NOTE — Progress Notes (Signed)
Please inform patient of the following:  Xray shows swelling but no fracture. Would like for her to let us know if her symptoms are not improving.  Algis Greenhouse. Jerline Pain, MD 02/12/2023 8:35 PM

## 2023-02-12 NOTE — ED Provider Triage Note (Signed)
Emergency Medicine Provider Triage Evaluation Note  [name] , a 72 y.o. female  was evaluated in triage.  Pt complains of chest pain and leg.  Chest pain started this morning.  It is in the left side of her chest.  Is nonradiating.  And associated shortness of breath.  Unsure what it feels like.  Patient does not know if it is exertional.  Having chest pain at this time.  Also mentioned that she fell earlier today and hurt her upper left leg.  Still able to walk and bear weight.  Review of Systems  Positive: See above Negative: See above  Physical Exam  BP (!) 99/58   Pulse (!) 103   Temp 97.6 F (36.4 C) (Oral)   Resp 20   SpO2 100%  Gen:   Awake, no distress   Resp:  Normal effort  MSK:   Moves extremities without difficulty  Other:    Medical Decision Making  Medically screening exam initiated at 10:04 PM.  Appropriate orders placed.  [name] was informed that the remainder of the evaluation will be completed by another provider, this initial triage assessment does not replace that evaluation, and the importance of remaining in the ED until their evaluation is complete.  Work up started   [name], [place] 02/12/23 2207

## 2023-02-13 ENCOUNTER — Inpatient Hospital Stay (HOSPITAL_COMMUNITY): Payer: Medicare Other

## 2023-02-13 DIAGNOSIS — R7989 Other specified abnormal findings of blood chemistry: Secondary | ICD-10-CM | POA: Diagnosis present

## 2023-02-13 DIAGNOSIS — R7889 Finding of other specified substances, not normally found in blood: Secondary | ICD-10-CM | POA: Diagnosis present

## 2023-02-13 DIAGNOSIS — R000 Tachycardia, unspecified: Secondary | ICD-10-CM | POA: Diagnosis present

## 2023-02-13 DIAGNOSIS — E1110 Type 2 diabetes mellitus with ketoacidosis without coma: Secondary | ICD-10-CM | POA: Diagnosis present

## 2023-02-13 DIAGNOSIS — E46 Unspecified protein-calorie malnutrition: Secondary | ICD-10-CM | POA: Diagnosis present

## 2023-02-13 LAB — BASIC METABOLIC PANEL
Anion gap: 23 — ABNORMAL HIGH (ref 5–15)
Anion gap: 10 (ref 5–15)
Anion gap: 8 (ref 5–15)
Anion gap: 8 (ref 5–15)
Anion gap: 7 (ref 5–15)
BUN: 30 mg/dL — ABNORMAL HIGH (ref 8–23)
BUN: 31 mg/dL — ABNORMAL HIGH (ref 8–23)
BUN: 28 mg/dL — ABNORMAL HIGH (ref 8–23)
BUN: 24 mg/dL — ABNORMAL HIGH (ref 8–23)
BUN: 23 mg/dL (ref 8–23)
CO2: 11 mmol/L — ABNORMAL LOW (ref 22–32)
CO2: 18 mmol/L — ABNORMAL LOW (ref 22–32)
CO2: 19 mmol/L — ABNORMAL LOW (ref 22–32)
CO2: 19 mmol/L — ABNORMAL LOW (ref 22–32)
CO2: 21 mmol/L — ABNORMAL LOW (ref 22–32)
Calcium: 9.4 mg/dL (ref 8.9–10.3)
Calcium: 8.8 mg/dL — ABNORMAL LOW (ref 8.9–10.3)
Calcium: 8.9 mg/dL (ref 8.9–10.3)
Calcium: 8.5 mg/dL — ABNORMAL LOW (ref 8.9–10.3)
Calcium: 8.6 mg/dL — ABNORMAL LOW (ref 8.9–10.3)
Chloride: 96 mmol/L — ABNORMAL LOW (ref 98–111)
Chloride: 106 mmol/L (ref 98–111)
Chloride: 108 mmol/L (ref 98–111)
Chloride: 108 mmol/L (ref 98–111)
Chloride: 109 mmol/L (ref 98–111)
Creatinine, Ser: 1.59 mg/dL — ABNORMAL HIGH (ref 0.44–1.00)
Creatinine, Ser: 0.96 mg/dL (ref 0.44–1.00)
Creatinine, Ser: 0.78 mg/dL (ref 0.44–1.00)
Creatinine, Ser: 0.68 mg/dL (ref 0.44–1.00)
Creatinine, Ser: 0.54 mg/dL (ref 0.44–1.00)
GFR, Estimated: 35 mL/min — ABNORMAL LOW (ref >60)
GFR, Estimated: >60 mL/min (ref >60)
GFR, Estimated: >60 mL/min (ref >60)
GFR, Estimated: >60 mL/min (ref >60)
GFR, Estimated: >60 mL/min (ref >60)
Glucose, Bld: 437 mg/dL — ABNORMAL HIGH (ref 70–99)
Glucose, Bld: 184 mg/dL — ABNORMAL HIGH (ref 70–99)
Glucose, Bld: 157 mg/dL — ABNORMAL HIGH (ref 70–99)
Glucose, Bld: 147 mg/dL — ABNORMAL HIGH (ref 70–99)
Glucose, Bld: 161 mg/dL — ABNORMAL HIGH (ref 70–99)
Potassium: 4.4 mmol/L (ref 3.5–5.1)
Potassium: 4.1 mmol/L (ref 3.5–5.1)
Potassium: 3.9 mmol/L (ref 3.5–5.1)
Potassium: 3.6 mmol/L (ref 3.5–5.1)
Potassium: 3.5 mmol/L (ref 3.5–5.1)
Sodium: 130 mmol/L — ABNORMAL LOW (ref 135–145)
Sodium: 134 mmol/L — ABNORMAL LOW (ref 135–145)
Sodium: 135 mmol/L (ref 135–145)
Sodium: 135 mmol/L (ref 135–145)
Sodium: 137 mmol/L (ref 135–145)

## 2023-02-13 LAB — GLUCOSE, CAPILLARY
Glucose-Capillary: 135 mg/dL — ABNORMAL HIGH (ref 70–99)
Glucose-Capillary: 146 mg/dL — ABNORMAL HIGH (ref 70–99)
Glucose-Capillary: 132 mg/dL — ABNORMAL HIGH (ref 70–99)
Glucose-Capillary: 144 mg/dL — ABNORMAL HIGH (ref 70–99)
Glucose-Capillary: 143 mg/dL — ABNORMAL HIGH (ref 70–99)
Glucose-Capillary: 132 mg/dL — ABNORMAL HIGH (ref 70–99)
Glucose-Capillary: 134 mg/dL — ABNORMAL HIGH (ref 70–99)
Glucose-Capillary: 145 mg/dL — ABNORMAL HIGH (ref 70–99)
Glucose-Capillary: 148 mg/dL — ABNORMAL HIGH (ref 70–99)
Glucose-Capillary: 163 mg/dL — ABNORMAL HIGH (ref 70–99)

## 2023-02-13 LAB — BLOOD GAS, VENOUS
Acid-base deficit: 10.7 mmol/L — ABNORMAL HIGH (ref 0.0–2.0)
Bicarbonate: 14.3 mmol/L — ABNORMAL LOW (ref 20.0–28.0)
O2 Saturation: 82.1 %
Patient temperature: 37
pCO2, Ven: 29 mmHg — ABNORMAL LOW (ref 44–60)
pH, Ven: 7.3 (ref 7.25–7.43)
pO2, Ven: 47 mmHg — ABNORMAL HIGH (ref 32–45)

## 2023-02-13 LAB — HEMOGLOBIN A1C
Hgb A1c MFr Bld: 14.1 % — ABNORMAL HIGH (ref 4.8–5.6)
Mean Plasma Glucose: 358 mg/dL

## 2023-02-13 LAB — CBC
HCT: 41.6 % (ref 36.0–46.0)
HCT: 34 % — ABNORMAL LOW (ref 36.0–46.0)
Hemoglobin: 13.3 g/dL (ref 12.0–15.0)
Hemoglobin: 11.2 g/dL — ABNORMAL LOW (ref 12.0–15.0)
MCH: 25.9 pg — ABNORMAL LOW (ref 26.0–34.0)
MCH: 26.3 pg (ref 26.0–34.0)
MCHC: 32 g/dL (ref 30.0–36.0)
MCHC: 32.9 g/dL (ref 30.0–36.0)
MCV: 81.1 fL (ref 80.0–100.0)
MCV: 79.8 fL — ABNORMAL LOW (ref 80.0–100.0)
Platelets: 261 10*3/uL (ref 150–400)
Platelets: 177 10*3/uL (ref 150–400)
RBC: 5.13 MIL/uL — ABNORMAL HIGH (ref 3.87–5.11)
RBC: 4.26 MIL/uL (ref 3.87–5.11)
RDW: 16.2 % — ABNORMAL HIGH (ref 11.5–15.5)
RDW: 16.3 % — ABNORMAL HIGH (ref 11.5–15.5)
WBC: 9.5 10*3/uL (ref 4.0–10.5)
WBC: 6.4 10*3/uL (ref 4.0–10.5)
nRBC: 0 % (ref 0.0–0.2)
nRBC: 0 % (ref 0.0–0.2)

## 2023-02-13 LAB — CBG MONITORING, ED
Glucose-Capillary: 333 mg/dL — ABNORMAL HIGH (ref 70–99)
Glucose-Capillary: 305 mg/dL — ABNORMAL HIGH (ref 70–99)
Glucose-Capillary: 213 mg/dL — ABNORMAL HIGH (ref 70–99)
Glucose-Capillary: 187 mg/dL — ABNORMAL HIGH (ref 70–99)
Glucose-Capillary: 183 mg/dL — ABNORMAL HIGH (ref 70–99)
Glucose-Capillary: 167 mg/dL — ABNORMAL HIGH (ref 70–99)
Glucose-Capillary: 172 mg/dL — ABNORMAL HIGH (ref 70–99)
Glucose-Capillary: 154 mg/dL — ABNORMAL HIGH (ref 70–99)

## 2023-02-13 LAB — BETA-HYDROXYBUTYRIC ACID
Beta-Hydroxybutyric Acid: 6.22 mmol/L — ABNORMAL HIGH (ref 0.05–0.27)
Beta-Hydroxybutyric Acid: 1.93 mmol/L — ABNORMAL HIGH (ref 0.05–0.27)
Beta-Hydroxybutyric Acid: 0.87 mmol/L — ABNORMAL HIGH (ref 0.05–0.27)

## 2023-02-13 LAB — TROPONIN I (HIGH SENSITIVITY)
Troponin I (High Sensitivity): 17 ng/L (ref <18)
Troponin I (High Sensitivity): 18 ng/L — ABNORMAL HIGH (ref <18)

## 2023-02-13 LAB — AMMONIA: Ammonia: 22 umol/L (ref 9–35)

## 2023-02-13 LAB — HIV ANTIBODY (ROUTINE TESTING W REFLEX): HIV Screen 4th Generation wRfx: NONREACTIVE

## 2023-02-13 LAB — MRSA NEXT GEN BY PCR, NASAL: MRSA by PCR Next Gen: NOT DETECTED

## 2023-02-13 MED ORDER — SODIUM CHLORIDE 0.9 % IV BOLUS
1000.0000 mL | Freq: Once | INTRAVENOUS | Status: AC
  Administered 2023-02-13: 1000 mL via INTRAVENOUS

## 2023-02-13 MED ORDER — CHLORHEXIDINE GLUCONATE CLOTH 2 % EX PADS
6.0000 | MEDICATED_PAD | Freq: Every day | CUTANEOUS | Status: DC
  Administered 2023-02-13 – 2023-02-14 (×2): 6 via TOPICAL

## 2023-02-13 MED ORDER — ATORVASTATIN CALCIUM 10 MG PO TABS
20.0000 mg | ORAL_TABLET | Freq: Every day | ORAL | Status: DC
  Administered 2023-02-14: 20 mg via ORAL
  Filled 2023-02-13: qty 2

## 2023-02-13 MED ORDER — INSULIN REGULAR(HUMAN) IN NACL 100-0.9 UT/100ML-% IV SOLN
INTRAVENOUS | Status: DC
  Administered 2023-02-13: 9.5 [IU]/h via INTRAVENOUS
  Filled 2023-02-13: qty 100

## 2023-02-13 MED ORDER — MIRTAZAPINE 30 MG PO TABS
30.0000 mg | ORAL_TABLET | Freq: Every day | ORAL | Status: DC
  Administered 2023-02-13: 30 mg via ORAL
  Filled 2023-02-13: qty 1
  Filled 2023-02-13: qty 2
  Filled 2023-02-13: qty 1

## 2023-02-13 MED ORDER — BENZTROPINE MESYLATE 0.5 MG PO TABS
1.0000 mg | ORAL_TABLET | Freq: Every day | ORAL | Status: DC
  Administered 2023-02-13: 1 mg via ORAL
  Filled 2023-02-13: qty 2

## 2023-02-13 MED ORDER — BUPROPION HCL 75 MG PO TABS
75.0000 mg | ORAL_TABLET | Freq: Two times a day (BID) | ORAL | Status: DC
  Administered 2023-02-13 – 2023-02-14 (×3): 75 mg via ORAL
  Filled 2023-02-13 (×4): qty 1

## 2023-02-13 MED ORDER — DEXTROSE IN LACTATED RINGERS 5 % IV SOLN: INTRAVENOUS | Status: DC

## 2023-02-13 MED ORDER — ACETAMINOPHEN 325 MG PO TABS: 650.0000 mg | ORAL_TABLET | Freq: Four times a day (QID) | ORAL | Status: DC | PRN

## 2023-02-13 MED ORDER — ARIPIPRAZOLE 10 MG PO TABS
30.0000 mg | ORAL_TABLET | Freq: Every day | ORAL | Status: DC
  Administered 2023-02-13: 30 mg via ORAL
  Filled 2023-02-13: qty 3

## 2023-02-13 MED ORDER — LACTATED RINGERS IV SOLN: INTRAVENOUS | Status: DC

## 2023-02-13 MED ORDER — TRAZODONE HCL 50 MG PO TABS: 50.0000 mg | ORAL_TABLET | Freq: Every evening | ORAL | Status: DC | PRN

## 2023-02-13 MED ORDER — METOPROLOL SUCCINATE ER 25 MG PO TB24
12.5000 mg | ORAL_TABLET | Freq: Every day | ORAL | Status: DC
  Administered 2023-02-13 – 2023-02-14 (×2): 12.5 mg via ORAL
  Filled 2023-02-13 (×2): qty 1

## 2023-02-13 MED ORDER — HALOPERIDOL 1 MG PO TABS
5.0000 mg | ORAL_TABLET | Freq: Every day | ORAL | Status: DC
  Administered 2023-02-13: 5 mg via ORAL
  Filled 2023-02-13: qty 5

## 2023-02-13 MED ORDER — DEXTROSE 50 % IV SOLN: 0.0000 mL | INTRAVENOUS | Status: DC | PRN

## 2023-02-13 MED ORDER — ONDANSETRON HCL 4 MG/2ML IJ SOLN: 4.0000 mg | Freq: Four times a day (QID) | INTRAMUSCULAR | Status: DC | PRN

## 2023-02-13 MED ORDER — ASPIRIN 81 MG PO TBEC
81.0000 mg | DELAYED_RELEASE_TABLET | Freq: Every day | ORAL | Status: DC
  Administered 2023-02-13: 81 mg via ORAL
  Filled 2023-02-13: qty 1

## 2023-02-13 MED ORDER — ENOXAPARIN SODIUM 40 MG/0.4ML IJ SOSY
40.0000 mg | PREFILLED_SYRINGE | INTRAMUSCULAR | Status: DC
  Administered 2023-02-13 – 2023-02-14 (×2): 40 mg via SUBCUTANEOUS
  Filled 2023-02-13 (×2): qty 0.4

## 2023-02-13 MED ORDER — POTASSIUM CHLORIDE 10 MEQ/100ML IV SOLN
10.0000 meq | INTRAVENOUS | Status: AC
  Administered 2023-02-13: 10 meq via INTRAVENOUS
  Filled 2023-02-13: qty 100

## 2023-02-13 MED ORDER — LACTATED RINGERS IV BOLUS
20.0000 mL/kg | Freq: Once | INTRAVENOUS | Status: AC
  Administered 2023-02-13: 1302 mL via INTRAVENOUS

## 2023-02-13 MED ORDER — POTASSIUM CHLORIDE 10 MEQ/100ML IV SOLN
10.0000 meq | INTRAVENOUS | Status: AC
  Administered 2023-02-13 (×2): 10 meq via INTRAVENOUS
  Filled 2023-02-13 (×2): qty 100

## 2023-02-13 NOTE — Progress Notes (Signed)
This is a 72 year old female with past medical history of diabetes mellitus, HLD, HTN, schizophrenia, depression with history of a stroke.  She was brought in with complaints of chest pains.  Found to be in DKA.  Glucose 437, AG 23, CO2 11, beta hydroxybutyric acid positive, creatinine 1.59 [typically normal] troponin 18.  Patient started on DKA protocol.  Admission requested

## 2023-02-13 NOTE — ED Provider Notes (Signed)
[place] EMERGENCY DEPARTMENT AT [facility] Provider Note   CSN: [id number] Arrival date & time: 02/12/23  2044     History  Chief Complaint  Patient presents with   Chest Pain    [name] is a 72 y.o. female.  Patient with history of type 1 diabetes presents today with complaints of chest pain. She states that same began yesterday morning but has since resolved. No shortness of breath. She states she has difficulty taking her insulin. Denies nausea, vomiting, diarrhea, or abdominal pain.    The history is provided by the patient. No language interpreter was used.  Chest Pain      Home Medications Prior to Admission medications   Medication Sig Start Date End Date Taking? Authorizing Provider  albuterol (VENTOLIN HFA) 108 (90 Base) MCG/ACT inhaler TAKE 2 PUFFS BY MOUTH EVERY 6 HOURS AS NEEDED FOR WHEEZE OR SHORTNESS OF BREATH 12/03/22   Vivi Barrack, MD  ARIPiprazole (ABILIFY) 30 MG tablet Take 30 mg by mouth at bedtime. 11/10/20   [provider]  aspirin 81 MG EC tablet Take 81 mg by mouth at bedtime.    [provider]  atorvastatin (LIPITOR) 20 MG tablet Take 20 mg by mouth daily. 11/09/19   [provider]  benztropine (COGENTIN) 1 MG tablet Take 1 mg by mouth at bedtime.     [provider]  buPROPion (WELLBUTRIN) 75 MG tablet Take 75 mg by mouth 2 (two) times daily.    [provider]  Cholecalciferol 50 MCG (2000 UT) CAPS Take by mouth.    [provider]  clotrimazole (LOTRIMIN) 1 % cream Apply to affected area 2 times daily 11/26/22   Fredia Sorrow, MD  clotrimazole-betamethasone (LOTRISONE) cream Apply 1 Application topically daily. 06/21/22   Maximiano Coss, NP  Continuous Blood Gluc Receiver (DEXCOM G6 RECEIVER) DEVI Use as directed for continuous glucose monitoring. 05/09/21   [provider]  Continuous Blood Gluc Sensor (FREESTYLE LIBRE 2 SENSOR) MISC 1 each by Does not  apply route every 14 (fourteen) days. 01/14/23   Vivi Barrack, MD  diclofenac (VOLTAREN) 75 MG EC tablet Take 1 tablet (75 mg total) by mouth 2 (two) times daily. 02/07/23   Vivi Barrack, MD  haloperidol (HALDOL) 1 MG tablet Take 1 mg by mouth daily. 03/23/22   [provider]  hydrOXYzine (ATARAX) 25 MG tablet Take 25-50 mg by mouth at bedtime as needed. 03/23/22   [provider]  insulin aspart (FIASP FLEXTOUCH) 100 UNIT/ML FlexTouch Pen INJECT 8 UNITS INTO THE SKIN 3 (THREE) TIMES DAILY BEFORE MEALS. 12/03/22   Vivi Barrack, MD  insulin degludec (TRESIBA FLEXTOUCH) 100 UNIT/ML FlexTouch Pen Inject 44 Units into the skin daily. 01/14/23   Vivi Barrack, MD  metoprolol succinate (TOPROL-XL) 25 MG 24 hr tablet Take 0.5 tablets (12.5 mg total) by mouth daily. 01/09/22   Maximiano Coss, NP  mirtazapine (REMERON) 30 MG tablet Take 30 mg by mouth at bedtime.     [provider]  OYSCO 500 500 MG TABS Take 1 tablet (1,250 mg total) by mouth daily. 01/03/22   Maximiano Coss, NP  potassium chloride (KLOR-CON) 10 MEQ tablet Take 2 tablets (20 mEq total) by mouth daily for 15 days. 11/30/20 08/16/22  Barton Dubois, MD  terbinafine (LAMISIL) 250 MG tablet Take 1 tablet (250 mg total) by mouth daily. 12/18/22   Jeanie Sewer, NP      Allergies    Penicillins  Review of Systems   Review of Systems  Cardiovascular:  Positive for chest pain (resolved).  All other systems reviewed and are negative.   Physical Exam Updated Vital Signs BP (!) 105/42 (BP Location: Left Arm)   Pulse 86   Temp (!) 97.5 F (36.4 C) (Oral)   Resp 14   SpO2 100%  Physical Exam Vitals and nursing note reviewed.  Constitutional:      General: She is not in acute distress.    Appearance: Normal appearance. She is normal weight. She is not toxic-appearing or diaphoretic.     Comments: Chronically ill appearing and disheveled   HENT:     Head: Normocephalic and atraumatic.   Cardiovascular:     Rate and Rhythm: Normal rate.  Pulmonary:     Effort: Pulmonary effort is normal. No respiratory distress.  Musculoskeletal:        General: Normal range of motion.     Cervical back: Normal range of motion.  Skin:    General: Skin is warm and dry.  Neurological:     General: No focal deficit present.     Mental Status: She is alert.  Psychiatric:        Mood and Affect: Mood normal.        Behavior: Behavior normal.     ED Results / Procedures / Treatments   Labs (all labs ordered are listed, but only abnormal results are displayed) Labs Reviewed  BASIC METABOLIC PANEL - Abnormal; Notable for the following components:      Result Value   Sodium 130 (*)    Chloride 96 (*)    CO2 11 (*)    Glucose, Bld 437 (*)    BUN 30 (*)    Creatinine, Ser 1.59 (*)    GFR, Estimated 35 (*)    Anion gap 23 (*)    All other components within normal limits  CBC - Abnormal; Notable for the following components:   RBC 5.13 (*)    MCH 25.9 (*)    RDW 16.2 (*)    All other components within normal limits  BLOOD GAS, VENOUS - Abnormal; Notable for the following components:   pCO2, Ven 29 (*)    pO2, Ven 47 (*)    Bicarbonate 14.3 (*)    Acid-base deficit 10.7 (*)    All other components within normal limits  TROPONIN I (HIGH SENSITIVITY) - Abnormal; Notable for the following components:   Troponin I (High Sensitivity) 18 (*)    All other components within normal limits  URINALYSIS, ROUTINE W REFLEX MICROSCOPIC  BETA-HYDROXYBUTYRIC ACID  TROPONIN I (HIGH SENSITIVITY)    EKG EKG Interpretation  Date/Time:  Tuesday February 12 2023 21:40:21 EDT Ventricular Rate:  99 PR Interval:  172 QRS Duration: 87 QT Interval:  351 QTC Calculation: 451 R Axis:   46 Text Interpretation: Sinus rhythm Artifact in lead(s) I III aVR aVL aVF V1 V2 Confirmed by Randal Buba, April ([id number]) on 02/13/2023 12:21:03 AM  Radiology DG Knee Complete 4 Views Left  Result Date:  02/12/2023 CLINICAL DATA:  Fall 2 days ago with knee pain, initial encounter EXAM: LEFT KNEE - COMPLETE 4+ VIEW COMPARISON:  None Available. FINDINGS: No evidence of fracture, dislocation, or joint effusion. No evidence of arthropathy or other focal bone abnormality. Soft tissues are unremarkable. IMPRESSION: No acute abnormality noted. Electronically Signed   By: Inez Catalina M.D.   On: 02/12/2023 22:43   DG FEMUR MIN 2 VIEWS LEFT  Result Date:  02/12/2023 CLINICAL DATA:  Fall 2 days ago with left leg pain, initial encounter EXAM: LEFT FEMUR 2 VIEWS COMPARISON:  None Available. FINDINGS: No acute fracture or dislocation is noted. No acute soft tissue abnormality is noted. Calcified uterine fibroid is noted in the left hemipelvis. IMPRESSION: No acute abnormality noted. Electronically Signed   By: Inez Catalina M.D.   On: 02/12/2023 22:41   DG Chest 2 View  Result Date: 02/12/2023 CLINICAL DATA:  Chest pain EXAM: CHEST - 2 VIEW COMPARISON:  11/27/2020 FINDINGS: Mild scarring in the right upper lung. No acute airspace disease or effusion. Stable cardiomediastinal silhouette. No pneumothorax. IMPRESSION: No active cardiopulmonary disease. Electronically Signed   By: Donavan Foil M.D.   On: 02/12/2023 22:06    Procedures .Critical Care  Performed by: Bud Face, PA-C Authorized by: Bud Face, PA-C   Critical care provider statement:    Critical care time (minutes):  30   Critical care start time:  02/13/2023 5:30 AM   Critical care end time:  02/13/2023 6:00 AM   Critical care was necessary to treat or prevent imminent or life-threatening deterioration of the following conditions:  Metabolic crisis   Critical care was time spent personally by me on the following activities:  Development of treatment plan with patient or surrogate, discussions with primary provider, evaluation of patient's response to treatment, examination of patient, obtaining history from patient or surrogate, ordering and  review of laboratory studies, ordering and review of radiographic studies, pulse oximetry, re-evaluation of patient's condition and review of old charts   Care discussed with: admitting provider       Medications Ordered in ED Medications  lactated ringers bolus 1,302 mL (has no administration in time range)  insulin regular, human (MYXREDLIN) 100 units/ 100 mL infusion (has no administration in time range)  lactated ringers infusion (has no administration in time range)  dextrose 5 % in lactated ringers infusion (has no administration in time range)  dextrose 50 % solution 0-50 mL (has no administration in time range)  potassium chloride 10 mEq in 100 mL IVPB (has no administration in time range)  sodium chloride 0.9 % bolus 1,000 mL (1,000 mLs Intravenous New Bag/Given 02/13/23 0440)    ED Course/ Medical Decision Making/ A&P                             Medical Decision Making Amount and/or Complexity of Data Reviewed Labs: ordered. Radiology: ordered.   This patient is a 72 y.o. female who presents to the ED for concern of chest pain, this involves an extensive number of treatment options, and is a complaint that carries with it a high risk of complications and morbidity. The emergent differential diagnosis prior to evaluation includes, but is not limited to,  ACS, pericarditis, myocarditis, aortic dissection, PE, pneumothorax, esophageal spasm or rupture, chronic angina, pneumonia, bronchitis, GERD, reflux/PUD, biliary disease, pancreatitis, costochondritis, anxiety  This is not an exhaustive differential.   Past Medical History / Co-morbidities / Social History: Hx insulin dependent diabetes  Additional history: Chart reviewed. Pertinent results include: several previous elevated CBG readings  Physical Exam: Physical exam performed. The pertinent findings include: disheveled and chronically ill appearing  Lab Tests: I ordered, and personally interpreted labs.  The  pertinent results include:  Na 130, chloride 96, bicarb 11, glucose 437, BUN 30, creatinine 1.59. Anion gap 23. Troponin 18. BHB 6.22   Imaging Studies: I ordered imaging  studies including DG chest, dg left knee and femur. I independently visualized and interpreted imaging which showed   DG chest: NAD  Left knee: no acute findings  Left femur: no acute findings  I agree with the radiologist interpretation.   Cardiac Monitoring:  The patient was maintained on a cardiac monitor.  My attending physician Dr. Randal Buba viewed and interpreted the cardiac monitored which showed an underlying rhythm of: sinus rhythm. I agree with this interpretation.   Medications: I ordered medication including insulin, fluids, potassium  for DKA. Reevaluation of the patient after these medicines showed that the patient improved. I have reviewed the patients home medicines and have made adjustments as needed.  Disposition:  Patient presents today with complaints of chest pain which has since resolved. She is afebrile, non-toxic appearing, and in no acute distress with reassuring vital signs. Patient found to be in DKA with an AKI. Will need admission for same. Discussed with patient who is understanding and in agreement.   Discussed patient with hospitalist who agrees to admit.  This is a shared visit with supervising physician Dr. Randal Buba who has independently evaluated patient & provided guidance in evaluation/management/disposition, in agreement with care    Final Clinical Impression(s) / ED Diagnoses Final diagnoses:  Diabetic ketoacidosis without coma associated with type 1 diabetes mellitus ([place])  AKI (acute kidney injury) [facility])    Rx / [facility] Orders ED Discharge Orders     None         Nestor Lewandowsky 02/13/23 0559    Palumbo, April, MD 02/13/23 [phone]

## 2023-02-13 NOTE — Progress Notes (Signed)
Initial Nutrition Assessment  DOCUMENTATION CODES:   Not applicable  INTERVENTION:  Education about nutrition services RD recommends carb modified diet  RD recommends Ensure Max or Glucerna BID when on a po diet    NUTRITION DIAGNOSIS:   Altered nutrition lab value related to limited prior education as evidenced by other (comment) (Glucose 437 upon admission).   GOAL:   Other (Comment) (Patient will manage blood glucose. Patient's blood glucose will stay in a desirable range)   MONITOR:   PO intake, Labs, Weight trends, Diet advancement  REASON FOR ASSESSMENT:   Consult Assessment of nutrition requirement/status  ASSESSMENT:   72 y.o. female with PMHx including schizophrenia, depression, hx of stroke HTN, HLD, depression, DM2 and admitted for DKA  Glucose upon admission was 437  Labs: Glu 157, BUN 28 Meds: remeron, wellbutrin, D5, lactated ringers  Wt: stable wt  PO: NPO  I/O's: +1 L   Visited patient at bedside who reports she was eating well PTA. Patient seems lethargic or confused and was not able to provide accurate answers during interview. She reports she lives with her boyfriend who does most of the cooking and she does some of the cooking at home.   Per Diabetes coordinator, patient's sister helps her with  insulin which she does not take every day. Patient reports not taking her BG regularly either. Patient denies N/V/D/C, trouble chewing/swallowing, and wt loss.   Patient is NPO for DKA protocol and understands.   She reported to RD that she was in pain.   NUTRITION - FOCUSED PHYSICAL EXAM:  Flowsheet Row Most Recent Value  Orbital Region Moderate depletion  Upper Arm Region Mild depletion  Thoracic and Lumbar Region No depletion  Buccal Region Moderate depletion  Temple Region Mild depletion  Clavicle Bone Region Mild depletion  Clavicle and Acromion Bone Region Mild depletion  Dorsal Hand Mild depletion  Patellar Region Mild depletion   Anterior Thigh Region Mild depletion  Posterior Calf Region Mild depletion  Edema (RD Assessment) None  Hair Reviewed  Eyes Reviewed  Mouth Reviewed  Skin Reviewed  Nails Reviewed       Diet Order:   Diet Order             Diet NPO time specified  Diet effective now                   EDUCATION NEEDS:   Not appropriate for education at this time  Skin:  Skin Assessment: Reviewed RN Assessment  Last BM:  unknown  Height:   Ht Readings from Last 1 Encounters:  02/13/23 5\' 5"  (1.651 m)    Weight:   Wt Readings from Last 1 Encounters:  02/13/23 68 kg     BMI:  Body mass index is 24.96 kg/m.  Estimated Nutritional Needs:   Kcal:  1700-1800  Protein:  80-90  Fluid:  >/= 2L    Trey Paula, RDN, LDN  Clinical Nutrition

## 2023-02-13 NOTE — H&P (Signed)
History and Physical  [name] [id number] DOB: [birth year] DOA: 02/12/2023  PCP: Vivi Barrack, MD   Chief Complaint: chest pain   HPI: [name] is a 72 y.o. female with medical history significant for hypertension, hyperlipidemia, depression, insulin-dependent type 2 diabetes be admitted to the hospital for diabetic ketoacidosis.  History is provided by the patient and her husband who was at the bedside, he says she has been doing well, she started to feel little bit sick yesterday, with some slight nausea, but no fevers, chills no nausea, vomiting.  P.o. intake has been normal, no diarrhea.  Has been taking her insulin as directed, though patient and her husband admitted that she does not always check her blood sugar, and does not always remember to take her insulin as directed.  In any case, they state that she started having some chest pain last evening, central to her chest, worse with deep inspiration.  Denies any pressure-like sensation, has not had discomfort like this before.  She does not check her blood sugar at home, does not know what it was.  Came to the ER for evaluation of the chest pain.  ED Course: In the emergency department, she had normal vital signs, lab work was obtained which showed abnormalities of sodium 130, anion gap of 23, creatinine up to 1.59 from normal baseline, and blood sugar of 437.  The patient was appropriately diagnosed with diabetic ketoacidosis.  She also had elevated beta hydroxybutyrate of 6.22, CBC was unremarkable.  Due to her complaints of chest pain troponin was checked was 18, 17.  Currently: On my evaluation of the patient, she is resting comfortably in the ER on insulin drip, with her husband at the bedside.  She denies any chest pain currently.   Review of Systems: Please see HPI for pertinent positives and negatives. A complete 10 system review of systems are otherwise negative.  Past Medical History:  Diagnosis Date    Allergy    Depression    Diabetes mellitus without complication ([place])    Hyperlipidemia    Hypertension    Mixed stress and urge urinary incontinence    Osteoporosis    Pneumonia    Schizophrenia ([place])    Stroke [facility])    History reviewed. No pertinent surgical history.  Social History:  reports that she has quit smoking. Her smoking use included cigarettes. She smoked an average of 3 packs per day. She has never used smokeless tobacco. She reports current alcohol use of about 6.0 standard drinks of alcohol per week. She reports that she does not use drugs.   Allergies  Allergen Reactions   Penicillins Rash and Hives    Did it involve swelling of the face/tongue/throat, SOB, or low BP? Unknown Did it involve sudden or severe rash/hives, skin peeling, or any reaction on the inside of your mouth or nose? Unknown Did you need to seek medical attention at a hospital or doctor's office? Unknown When did it last happen?       If all above answers are "NO", may proceed with cephalosporin use.  Did it involve swelling of the face/tongue/throat, SOB, or low BP? Unknown Did it involve sudden or severe rash/hives, skin peeling, or any reaction on the inside of your mouth or nose? Unknown Did you need to seek medical attention at a hospital or doctor's office? Unknown When did it last happen?       If all above answers are "NO", may proceed with cephalosporin use.  Family History  Problem Relation Age of Onset   Heart disease Mother    Hypertension Father    Stroke Father    Heart disease Father    Diabetes Father    Emphysema Paternal Grandfather      Prior to Admission medications   Medication Sig Start Date End Date Taking? Authorizing Provider  albuterol (VENTOLIN HFA) 108 (90 Base) MCG/ACT inhaler TAKE 2 PUFFS BY MOUTH EVERY 6 HOURS AS NEEDED FOR WHEEZE OR SHORTNESS OF BREATH 12/03/22   Vivi Barrack, MD  ARIPiprazole (ABILIFY) 30 MG tablet Take 30 mg by mouth at bedtime.  11/10/20   [provider]  aspirin 81 MG EC tablet Take 81 mg by mouth at bedtime.    [provider]  atorvastatin (LIPITOR) 20 MG tablet Take 20 mg by mouth daily. 11/09/19   [provider]  benztropine (COGENTIN) 1 MG tablet Take 1 mg by mouth at bedtime.     [provider]  buPROPion (WELLBUTRIN) 75 MG tablet Take 75 mg by mouth 2 (two) times daily.    [provider]  Cholecalciferol 50 MCG (2000 UT) CAPS Take by mouth.    [provider]  clotrimazole (LOTRIMIN) 1 % cream Apply to affected area 2 times daily 11/26/22   Fredia Sorrow, MD  clotrimazole-betamethasone (LOTRISONE) cream Apply 1 Application topically daily. 06/21/22   Maximiano Coss, NP  Continuous Blood Gluc Receiver (DEXCOM G6 RECEIVER) DEVI Use as directed for continuous glucose monitoring. 05/09/21   [provider]  Continuous Blood Gluc Sensor (FREESTYLE LIBRE 2 SENSOR) MISC 1 each by Does not apply route every 14 (fourteen) days. 01/14/23   Vivi Barrack, MD  diclofenac (VOLTAREN) 75 MG EC tablet Take 1 tablet (75 mg total) by mouth 2 (two) times daily. 02/07/23   Vivi Barrack, MD  haloperidol (HALDOL) 1 MG tablet Take 1 mg by mouth daily. 03/23/22   [provider]  hydrOXYzine (ATARAX) 25 MG tablet Take 25-50 mg by mouth at bedtime as needed. 03/23/22   [provider]  insulin aspart (FIASP FLEXTOUCH) 100 UNIT/ML FlexTouch Pen INJECT 8 UNITS INTO THE SKIN 3 (THREE) TIMES DAILY BEFORE MEALS. 12/03/22   Vivi Barrack, MD  insulin degludec (TRESIBA FLEXTOUCH) 100 UNIT/ML FlexTouch Pen Inject 44 Units into the skin daily. 01/14/23   Vivi Barrack, MD  metoprolol succinate (TOPROL-XL) 25 MG 24 hr tablet Take 0.5 tablets (12.5 mg total) by mouth daily. 01/09/22   Maximiano Coss, NP  mirtazapine (REMERON) 30 MG tablet Take 30 mg by mouth at bedtime.     [provider]  OYSCO 500 500 MG TABS Take 1 tablet (1,250 mg total) by mouth daily.  01/03/22   Maximiano Coss, NP  potassium chloride (KLOR-CON) 10 MEQ tablet Take 2 tablets (20 mEq total) by mouth daily for 15 days. 11/30/20 08/16/22  Barton Dubois, MD  terbinafine (LAMISIL) 250 MG tablet Take 1 tablet (250 mg total) by mouth daily. 12/18/22   Jeanie Sewer, NP    Physical Exam: BP (!) 112/46   Pulse 84   Temp 98.9 F (37.2 C) (Oral)   Resp 16   SpO2 100%   General:  Alert, oriented, calm, in no acute distress, disheveled and thin Eyes: EOMI, clear conjuctivae, white sclerea Neck: supple, no masses, trachea mildline  Cardiovascular: RRR, no murmurs or rubs, no peripheral edema  Respiratory: clear to auscultation bilaterally, no wheezes, no crackles  Abdomen: soft, nontender, nondistended, normal bowel tones  heard  Skin: dry, no rashes  Musculoskeletal: no joint effusions, normal range of motion  Psychiatric: appropriate affect, normal speech  Neurologic: extraocular muscles intact, clear speech, moving all extremities with intact sensorium          Labs on Admission:  Basic Metabolic Panel: Recent Labs  Lab 02/13/23 0156  NA 130*  K 4.4  CL 96*  CO2 11*  GLUCOSE 437*  BUN 30*  CREATININE 1.59*  CALCIUM 9.4   Liver Function Tests: No results for input(s): "AST", "ALT", "ALKPHOS", "BILITOT", "PROT", "ALBUMIN" in the last 168 hours. No results for input(s): "LIPASE", "AMYLASE" in the last 168 hours. No results for input(s): "AMMONIA" in the last 168 hours. CBC: Recent Labs  Lab 02/13/23 0156  WBC 9.5  HGB 13.3  HCT 41.6  MCV 81.1  PLT 261   Cardiac Enzymes: No results for input(s): "CKTOTAL", "CKMB", "CKMBINDEX", "TROPONINI" in the last 168 hours.  BNP (last 3 results) No results for input(s): "BNP" in the last 8760 hours.  ProBNP (last 3 results) No results for input(s): "PROBNP" in the last 8760 hours.  CBG: Recent Labs  Lab 02/13/23 0607 02/13/23 0647 02/13/23 0759  GLUCAP 333* 305* 213*    Radiological Exams on  Admission: DG Knee Complete 4 Views Left  Result Date: 02/12/2023 CLINICAL DATA:  Fall 2 days ago with knee pain, initial encounter EXAM: LEFT KNEE - COMPLETE 4+ VIEW COMPARISON:  None Available. FINDINGS: No evidence of fracture, dislocation, or joint effusion. No evidence of arthropathy or other focal bone abnormality. Soft tissues are unremarkable. IMPRESSION: No acute abnormality noted. Electronically Signed   By: Inez Catalina M.D.   On: 02/12/2023 22:43   DG FEMUR MIN 2 VIEWS LEFT  Result Date: 02/12/2023 CLINICAL DATA:  Fall 2 days ago with left leg pain, initial encounter EXAM: LEFT FEMUR 2 VIEWS COMPARISON:  None Available. FINDINGS: No acute fracture or dislocation is noted. No acute soft tissue abnormality is noted. Calcified uterine fibroid is noted in the left hemipelvis. IMPRESSION: No acute abnormality noted. Electronically Signed   By: Inez Catalina M.D.   On: 02/12/2023 22:41   DG Chest 2 View  Result Date: 02/12/2023 CLINICAL DATA:  Chest pain EXAM: CHEST - 2 VIEW COMPARISON:  11/27/2020 FINDINGS: Mild scarring in the right upper lung. No acute airspace disease or effusion. Stable cardiomediastinal silhouette. No pneumothorax. IMPRESSION: No active cardiopulmonary disease. Electronically Signed   By: Donavan Foil M.D.   On: 02/12/2023 22:06    Assessment/Plan Principal Problem:   DKA (diabetic ketoacidosis) ([place]) -inciting factors unclear, though the patient does admit that she is often noncompliant with her insulin, and does not check her sugar levels.  She presents with diabetic ketoacidosis with elevated blood sugar, elevated beta hydroxybutyrate, and significant anion gap. -Inpatient admission to stepdown -Insulin drip per DKA protocol, labs ordered -Patient will remain n.p.o. -Per protocol, patient will be transitioned off of insulin drip once blood sugar normalizes, and gap is closed -She has pseudohyponatremia, AKI, etc. 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  Checking D-dimer probably has limited utility, as it will be elevated due to her DKA, and we cannot obtain CTA chest to rule out PE due to her acute renal failure.  PE is not highly suspected enough to start empiric anticoagulation.  DVT prophylaxis: Lovenox     Code Status: Full Code  Consults called: None  Admission status: The appropriate patient status for this patient is INPATIENT. Inpatient status is judged to be reasonable and necessary in order to provide the required intensity of service to ensure the patient's safety. The patient's presenting symptoms, physical exam findings, and initial radiographic and laboratory data in the context of their chronic comorbidities is felt to place them at high risk for further clinical deterioration. Furthermore, it is not anticipated that the patient will be medically stable for discharge from the hospital within 2 midnights of admission.    I certify that at the point of admission it is my clinical judgment that the patient  will require inpatient hospital care spanning beyond 2 midnights from the point of admission due to high intensity of service, high risk for further deterioration and high frequency of surveillance required  Time spent: 49 minutes  Dollye Glasser Neva Seat MD [facility] Pager [phone]  If 7PM-7AM, please contact night-coverage [email or web address] Password [email or web address]  02/13/2023, 8:34 AM

## 2023-02-13 NOTE — Inpatient Diabetes Management (Signed)
Inpatient Diabetes Program Recommendations  AACE/ADA: New Consensus Statement on Inpatient Glycemic Control (2015)  Target Ranges:  Prepandial:   less than 140 mg/dL      Peak postprandial:   less than 180 mg/dL (1-2 hours)      Critically ill patients:  140 - 180 mg/dL   Lab Results  Component Value Date   GLUCAP 172 (H) 02/13/2023   HGBA1C 13.6 (A) 01/14/2023    Review of Glycemic Control  Latest Reference Range & Units 02/13/23 09:21  CO2 22 - 32 mmol/L 18 (L)  Anion gap 5 - 15  10  Beta-Hydroxybutyric Acid 0.05 - 0.27 mmol/L 1.93 (H)  (L): Data is abnormally low (H): Data is abnormally high   Diabetes history: DM Outpatient Diabetes medications: Fiasp 8 units TID and Tresiba 44 units QD Current orders for Inpatient glycemic control: IV insulin  Inpatient Diabetes Program Recommendations:    Once BHB is < 0.5 please consider:  Semglee 20 units 2 hrs prior to discontinuing IV insulin Novolog 0-9 units Q4H Once eating-Novolog 0-9 units TID and 0-5 units QHS, Novolog 4 units TID with meals  Spoke with patient at bedside.  She seems a bit confused.  She cannot tell me which insulins she takes nor how many units.  She says her sister comes to the house to help her administer her insulins.  She states she does not check her blood glucose and her BG always runs high.  She gave permission for me to speak with her sister.  Called [name] at [phone] but no answer.  Her sister is listed as alternate contact.    Will continue to follow while inpatient.  Thank you, Reche Dixon, MSN, [place] Diabetes Coordinator Inpatient Diabetes Program [phone] (team pager from 8a-5p)

## 2023-02-13 NOTE — ED Notes (Signed)
Doctor was notified of pts confusion

## 2023-02-13 NOTE — ED Notes (Signed)
ED TO INPATIENT HANDOFF REPORT  Name/Age/Gender [name] 72 y.o. female  Code Status    Code Status Orders  (From admission, onward)           Start     Ordered   02/13/23 0833  Full code  Continuous       Question:  By:  Answer:  Consent: discussion documented in EHR   02/13/23 0834           Code Status History     Date Active Date Inactive Code Status Order ID Comments User Context   11/24/2020 1527 11/30/2020 1813 Full Code [id number]  Elgergawy, Silver Huguenin, MD ED   12/04/2019 2045 12/06/2019 1744 Full Code [id number]  Truett Mainland, DO ED   04/15/2017 1326 04/25/2017 2100 Full Code [id number]  Chesley Mires, MD ED   09/24/2016 2143 09/27/2016 1633 Full Code [id number]  Vianne Bulls, MD ED   10/13/2014 1802 11/04/2014 2350 Full Code [id number]  Ermalinda Memos Inpatient   10/01/2014 1240 10/13/2014 1800 Full Code [id number]  Rush Farmer, MD ED   05/22/2014 1815 05/24/2014 1611 Full Code [id number]  Thurnell Lose, MD Inpatient       Home/SNF/Other Home  Chief Complaint DKA (diabetic ketoacidosis) ([place]) [E11.10]  Level of Care/Admitting Diagnosis ED Disposition     ED Disposition  Admit   Condition  --   Comment  Hospital Area: [facility] [[id number]]  Level of Care: Stepdown [14]  Admit to SDU based on following criteria: Severe physiological/psychological symptoms:  Any diagnosis requiring assessment & intervention at least every 4 hours on an ongoing basis to obtain desired patient outcomes including stability and rehabilitation  May admit patient to [facility] or [facility] if equivalent level of care is available:: Yes  Covid Evaluation: Asymptomatic - no recent exposure (last 10 days) testing not required  Diagnosis: DKA (diabetic ketoacidosis) [facility]) [id number]  Admitting Physician: Lucillie Garfinkel [id number]  Attending Physician: Shaugh[place], MIR Kari.Conine [id number]  Certification:: I certify this patient will need inpatient services for  at least 2 midnights  Estimated Length of Stay: 3          Medical History Past Medical History:  Diagnosis Date   Allergy    Depression    Diabetes mellitus without complication ([place])    Hyperlipidemia    Hypertension    Mixed stress and urge urinary incontinence    Osteoporosis    Pneumonia    Schizophrenia ([place])    Stroke ([place])     Allergies Allergies  Allergen Reactions   Penicillins Rash and Hives    Did it involve swelling of the face/tongue/throat, SOB, or low BP? Unknown Did it involve sudden or severe rash/hives, skin peeling, or any reaction on the inside of your mouth or nose? Unknown Did you need to seek medical attention at a hospital or doctor's office? Unknown When did it last happen?       If all above answers are "NO", may proceed with cephalosporin use.  Did it involve swelling of the face/tongue/throat, SOB, or low BP? Unknown Did it involve sudden or severe rash/hives, skin peeling, or any reaction on the inside of your mouth or nose? Unknown Did you need to seek medical attention at a hospital or doctor's office? Unknown When did it last happen?       If all above answers are "NO", may proceed with cephalosporin use.    IV Location/Drains/Wounds Patient Lines/Drains/Airways Status  Active Line/Drains/Airways     Name Placement date Placement time Site Days   Peripheral IV 02/13/23 20 G Right Antecubital 02/13/23  0400  Antecubital  less than 1   Peripheral IV 02/13/23 22 G Anterior;Right Wrist 02/13/23  0530  Wrist  less than 1   Peripheral IV 02/13/23 20 G Anterior;Left Forearm 02/13/23  0920  Forearm  less than 1            Labs/Imaging Results for orders placed or performed during the hospital encounter of 02/12/23 (from the past 48 hour(s))  Basic metabolic panel     Status: Abnormal   Collection Time: 02/13/23  1:56 AM  Result Value Ref Range   Sodium 130 (L) 135 - 145 mmol/L   Potassium 4.4 3.5 - 5.1 mmol/L   Chloride 96 (L)  98 - 111 mmol/L   CO2 11 (L) 22 - 32 mmol/L   Glucose, Bld 437 (H) 70 - 99 mg/dL    Comment: Glucose reference range applies only to samples taken after fasting for at least 8 hours.   BUN 30 (H) 8 - 23 mg/dL   Creatinine, Ser 1.59 (H) 0.44 - 1.00 mg/dL   Calcium 9.4 8.9 - 10.3 mg/dL   GFR, Estimated 35 (L) >60 mL/min    Comment: (NOTE) Calculated using the CKD-EPI Creatinine Equation (2021)    Anion gap 23 (H) 5 - 15    Comment: ELECTROLYTES REPEATED TO VERIFY Performed at [facility], [place] [address], [place], [place] [address]   CBC     Status: Abnormal   Collection Time: 02/13/23  1:56 AM  Result Value Ref Range   WBC 9.5 4.0 - 10.5 K/uL   RBC 5.13 (H) 3.87 - 5.11 MIL/uL   Hemoglobin 13.3 12.0 - 15.0 g/dL   HCT 41.6 36.0 - 46.0 %   MCV 81.1 80.0 - 100.0 fL   MCH 25.9 (L) 26.0 - 34.0 pg   MCHC 32.0 30.0 - 36.0 g/dL   RDW 16.2 (H) 11.5 - 15.5 %   Platelets 261 150 - 400 K/uL   nRBC 0.0 0.0 - 0.2 %    Comment: Performed at [facility], [place] [address] [place], [place] [address]  Troponin I (High Sensitivity)     Status: None   Collection Time: 02/13/23  1:56 AM  Result Value Ref Range   Troponin I (High Sensitivity) 17 <18 ng/L    Comment: (NOTE) Elevated high sensitivity troponin I (hsTnI) values and significant  changes across serial measurements may suggest ACS but many other  chronic and acute conditions are known to elevate hsTnI results.  Refer to the "Links" section for chest pain algorithms and additional  guidance. Performed at [facility], [place] [address], [place], [place] [address]   Troponin I (High Sensitivity)     Status: Abnormal   Collection Time: 02/13/23  3:57 AM  Result Value Ref Range   Troponin I (High Sensitivity) 18 (H) <18 ng/L    Comment: (NOTE) Elevated high sensitivity troponin I (hsTnI) values and significant  changes across serial measurements may suggest ACS but many other   chronic and acute conditions are known to elevate hsTnI results.  Refer to the "Links" section for chest pain algorithms and additional  guidance. Performed at [facility], [place] [address] [place], [place] [address]   Beta-hydroxybutyric acid     Status: Abnormal   Collection Time: 02/13/23  3:57 AM  Result Value  Ref Range   Beta-Hydroxybutyric Acid 6.22 (H) 0.05 - 0.27 mmol/L    Comment: RESULT CONFIRMED BY MANUAL DILUTION Performed at [place] [address], [place], [place] [address]   Blood gas, venous (at [facility])     Status: Abnormal   Collection Time: 02/13/23  4:10 AM  Result Value Ref Range   pH, Ven 7.3 7.25 - 7.43   pCO2, Ven 29 (L) 44 - 60 mmHg   pO2, Ven 47 (H) 32 - 45 mmHg   Bicarbonate 14.3 (L) 20.0 - 28.0 mmol/L   Acid-base deficit 10.7 (H) 0.0 - 2.0 mmol/L   O2 Saturation 82.1 %   Patient temperature 37.0     Comment: Performed at [facility], [place] [address], [place], [place] [address]  CBG monitoring, ED     Status: Abnormal   Collection Time: 02/13/23  6:07 AM  Result Value Ref Range   Glucose-Capillary 333 (H) 70 - 99 mg/dL    Comment: Glucose reference range applies only to samples taken after fasting for at least 8 hours.   Comment 1 Notify RN   CBG monitoring, ED     Status: Abnormal   Collection Time: 02/13/23  6:47 AM  Result Value Ref Range   Glucose-Capillary 305 (H) 70 - 99 mg/dL    Comment: Glucose reference range applies only to samples taken after fasting for at least 8 hours.  CBG monitoring, ED     Status: Abnormal   Collection Time: 02/13/23  7:59 AM  Result Value Ref Range   Glucose-Capillary 213 (H) 70 - 99 mg/dL    Comment: Glucose reference range applies only to samples taken after fasting for at least 8 hours.  CBG monitoring, ED     Status: Abnormal   Collection Time: 02/13/23  8:42 AM  Result Value Ref Range   Glucose-Capillary 187 (H) 70 - 99 mg/dL    Comment:  Glucose reference range applies only to samples taken after fasting for at least 8 hours.  CBC     Status: Abnormal   Collection Time: 02/13/23  9:21 AM  Result Value Ref Range   WBC 6.4 4.0 - 10.5 K/uL   RBC 4.26 3.87 - 5.11 MIL/uL   Hemoglobin 11.2 (L) 12.0 - 15.0 g/dL   HCT 34.0 (L) 36.0 - 46.0 %   MCV 79.8 (L) 80.0 - 100.0 fL   MCH 26.3 26.0 - 34.0 pg   MCHC 32.9 30.0 - 36.0 g/dL   RDW 16.3 (H) 11.5 - 15.5 %   Platelets 177 150 - 400 K/uL   nRBC 0.0 0.0 - 0.2 %    Comment: Performed at [facility], [place] [address], [place], [place] [id number]  Basic metabolic panel     Status: Abnormal   Collection Time: 02/13/23  9:21 AM  Result Value Ref Range   Sodium 134 (L) 135 - 145 mmol/L   Potassium 4.1 3.5 - 5.1 mmol/L   Chloride 106 98 - 111 mmol/L   CO2 18 (L) 22 - 32 mmol/L   Glucose, Bld 184 (H) 70 - 99 mg/dL    Comment: Glucose reference range applies only to samples taken after fasting for at least 8 hours.   BUN 31 (H) 8 - 23 mg/dL   Creatinine, Ser 0.96 0.44 - 1.00 mg/dL   Calcium 8.8 (L) 8.9 - 10.3 mg/dL   GFR, Estimated >60 >60 mL/min    Comment: (NOTE) Calculated using the  CKD-EPI Creatinine Equation (2021)    Anion gap 10 5 - 15    Comment: Performed at [facility], [place] [address], [place], [place] [address]  Beta-hydroxybutyric acid     Status: Abnormal   Collection Time: 02/13/23  9:21 AM  Result Value Ref Range   Beta-Hydroxybutyric Acid 1.93 (H) 0.05 - 0.27 mmol/L    Comment: Performed at [facility], [place] [address], [place], [place] [address]  CBG monitoring, ED     Status: Abnormal   Collection Time: 02/13/23  9:54 AM  Result Value Ref Range   Glucose-Capillary 183 (H) 70 - 99 mg/dL    Comment: Glucose reference range applies only to samples taken after fasting for at least 8 hours.  CBG monitoring, ED     Status: Abnormal   Collection Time: 02/13/23 11:01 AM  Result Value Ref Range    Glucose-Capillary 167 (H) 70 - 99 mg/dL    Comment: Glucose reference range applies only to samples taken after fasting for at least 8 hours.   DG Knee Complete 4 Views Left  Result Date: 02/12/2023 CLINICAL DATA:  Fall 2 days ago with knee pain, initial encounter EXAM: LEFT KNEE - COMPLETE 4+ VIEW COMPARISON:  None Available. FINDINGS: No evidence of fracture, dislocation, or joint effusion. No evidence of arthropathy or other focal bone abnormality. Soft tissues are unremarkable. IMPRESSION: No acute abnormality noted. Electronically Signed   By: Inez Catalina M.D.   On: 02/12/2023 22:43   DG FEMUR MIN 2 VIEWS LEFT  Result Date: 02/12/2023 CLINICAL DATA:  Fall 2 days ago with left leg pain, initial encounter EXAM: LEFT FEMUR 2 VIEWS COMPARISON:  None Available. FINDINGS: No acute fracture or dislocation is noted. No acute soft tissue abnormality is noted. Calcified uterine fibroid is noted in the left hemipelvis. IMPRESSION: No acute abnormality noted. Electronically Signed   By: Inez Catalina M.D.   On: 02/12/2023 22:41   DG Chest 2 View  Result Date: 02/12/2023 CLINICAL DATA:  Chest pain EXAM: CHEST - 2 VIEW COMPARISON:  11/27/2020 FINDINGS: Mild scarring in the right upper lung. No acute airspace disease or effusion. Stable cardiomediastinal silhouette. No pneumothorax. IMPRESSION: No active cardiopulmonary disease. Electronically Signed   By: Donavan Foil M.D.   On: 02/12/2023 22:06    Pending Labs Unresulted Labs (From admission, onward)     Start     Ordered   02/20/23 0500  Creatinine, serum  (enoxaparin (LOVENOX)    CrCl >/= 30 ml/min)  Weekly,   R     Comments: while on enoxaparin therapy    02/13/23 0834   02/13/23 [id number]  Basic metabolic panel  (Diabetes Ketoacidosis (DKA))  STAT Now then every 4 hours ,   R (with STAT occurrences)      02/13/23 0834   02/13/23 0834  Beta-hydroxybutyric acid  (Diabetes Ketoacidosis (DKA))  Now then every 8 hours,   R (with TIMED occurrences)       02/13/23 0834   02/13/23 0834  Hemoglobin A1c  (Diabetes Ketoacidosis (DKA))  Once,   R       Comments: To assess prior glycemic control    02/13/23 0834   02/13/23 0833  HIV Antibody (routine testing w rflx)  (HIV Antibody (Routine testing w reflex) panel)  Once,   R        02/13/23 0834   02/13/23 0322  Urinalysis, Routine w reflex microscopic -Urine, Clean Catch  Once,   URGENT  Question Answer Comment  Specimen Source Urine, Clean Catch   Release to patient Immediate      02/13/23 0321            Vitals/Pain Today's Vitals   02/13/23 1000 02/13/23 1030 02/13/23 1100 02/13/23 1116  BP: (!) 107/45 (!) 112/46 (!) 125/52   Pulse: 75 73 79   Resp: 14 15 14    Temp:    98.5 F (36.9 C)  TempSrc:    Oral  SpO2: 99% 96% 97%   Weight:      Height:      PainSc:        Isolation Precautions No active isolations  Medications Medications  insulin regular, human (MYXREDLIN) 100 units/ 100 mL infusion (9.5 Units/hr Intravenous New Bag/Given 02/13/23 0611)  lactated ringers infusion ( Intravenous New Bag/Given 02/13/23 0834)  dextrose 5 % in lactated ringers infusion ( Intravenous New Bag/Given 02/13/23 0832)  dextrose 50 % solution 0-50 mL (has no administration in time range)  aspirin EC tablet 81 mg (has no administration in time range)  atorvastatin (LIPITOR) tablet 20 mg (has no administration in time range)  metoprolol succinate (TOPROL-XL) 24 hr tablet 12.5 mg (has no administration in time range)  ARIPiprazole (ABILIFY) tablet 30 mg (has no administration in time range)  buPROPion (WELLBUTRIN) tablet 75 mg (has no administration in time range)  haloperidol (HALDOL) tablet 1 mg (has no administration in time range)  mirtazapine (REMERON) tablet 30 mg (has no administration in time range)  benztropine (COGENTIN) tablet 1 mg (has no administration in time range)  enoxaparin (LOVENOX) injection 40 mg (40 mg Subcutaneous Given 02/13/23 1117)  lactated ringers infusion (  Intravenous Not Given 02/13/23 0858)  dextrose 5 % in lactated ringers infusion ( Intravenous Not Given 02/13/23 0858)  potassium chloride 10 mEq in 100 mL IVPB (10 mEq Intravenous Not Given 02/13/23 1112)  acetaminophen (TYLENOL) tablet 650 mg (has no administration in time range)  ondansetron (ZOFRAN) injection 4 mg (has no administration in time range)  traZODone (DESYREL) tablet 50 mg (has no administration in time range)  sodium chloride 0.9 % bolus 1,000 mL (0 mLs Intravenous Stopped 02/13/23 0644)  lactated ringers bolus 1,302 mL (0 mLs Intravenous Stopped 02/13/23 0815)  potassium chloride 10 mEq in 100 mL IVPB (0 mEq Intravenous Stopped 02/13/23 0755)    Mobility walks

## 2023-02-14 ENCOUNTER — Inpatient Hospital Stay (HOSPITAL_BASED_OUTPATIENT_CLINIC_OR_DEPARTMENT_OTHER): Payer: Medicare Other

## 2023-02-14 DIAGNOSIS — E1010 Type 1 diabetes mellitus with ketoacidosis without coma: Secondary | ICD-10-CM

## 2023-02-14 DIAGNOSIS — R079 Chest pain, unspecified: Secondary | ICD-10-CM | POA: Diagnosis not present

## 2023-02-14 DIAGNOSIS — E1110 Type 2 diabetes mellitus with ketoacidosis without coma: Secondary | ICD-10-CM | POA: Diagnosis not present

## 2023-02-14 LAB — GLUCOSE, CAPILLARY
Glucose-Capillary: 110 mg/dL — ABNORMAL HIGH (ref 70–99)
Glucose-Capillary: 134 mg/dL — ABNORMAL HIGH (ref 70–99)
Glucose-Capillary: 152 mg/dL — ABNORMAL HIGH (ref 70–99)
Glucose-Capillary: 162 mg/dL — ABNORMAL HIGH (ref 70–99)
Glucose-Capillary: 171 mg/dL — ABNORMAL HIGH (ref 70–99)
Glucose-Capillary: 249 mg/dL — ABNORMAL HIGH (ref 70–99)
Glucose-Capillary: 91 mg/dL (ref 70–99)

## 2023-02-14 LAB — ECHOCARDIOGRAM COMPLETE
AR max vel: 2.49 cm2
AV Area VTI: 2.78 cm2
AV Area mean vel: 2.55 cm2
AV Mean grad: 8 mmHg
AV Peak grad: 15.5 mmHg
Ao pk vel: 1.97 m/s
Area-P 1/2: 3.37 cm2
Calc EF: 60.2 %
Height: 65 in
P 1/2 time: 619 msec
S' Lateral: 3.3 cm
Single Plane A2C EF: 63 %
Single Plane A4C EF: 56.4 %
Weight: 2400 oz

## 2023-02-14 LAB — BASIC METABOLIC PANEL
Anion gap: 5 (ref 5–15)
BUN: 18 mg/dL (ref 8–23)
CO2: 23 mmol/L (ref 22–32)
Calcium: 8.6 mg/dL — ABNORMAL LOW (ref 8.9–10.3)
Chloride: 106 mmol/L (ref 98–111)
Creatinine, Ser: 0.61 mg/dL (ref 0.44–1.00)
GFR, Estimated: >60 mL/min (ref >60)
Glucose, Bld: 163 mg/dL — ABNORMAL HIGH (ref 70–99)
Potassium: 3.5 mmol/L (ref 3.5–5.1)
Sodium: 134 mmol/L — ABNORMAL LOW (ref 135–145)

## 2023-02-14 LAB — D-DIMER, QUANTITATIVE: D-Dimer, Quant: 0.67 ug/mL-FEU — ABNORMAL HIGH (ref 0.00–0.50)

## 2023-02-14 LAB — BETA-HYDROXYBUTYRIC ACID: Beta-Hydroxybutyric Acid: 0.72 mmol/L — ABNORMAL HIGH (ref 0.05–0.27)

## 2023-02-14 MED ORDER — INSULIN ASPART 100 UNIT/ML IJ SOLN
0.0000 [IU] | Freq: Three times a day (TID) | INTRAMUSCULAR | Status: DC
  Administered 2023-02-14: 3 [IU] via SUBCUTANEOUS

## 2023-02-14 MED ORDER — TRESIBA FLEXTOUCH 100 UNIT/ML ~~LOC~~ SOPN: 38.0000 [IU] | PEN_INJECTOR | Freq: Every day | SUBCUTANEOUS | 0 refills | Status: DC

## 2023-02-14 MED ORDER — INSULIN GLARGINE-YFGN 100 UNIT/ML ~~LOC~~ SOLN
30.0000 [IU] | Freq: Every day | SUBCUTANEOUS | Status: DC
  Administered 2023-02-14: 30 [IU] via SUBCUTANEOUS
  Filled 2023-02-14: qty 0.3

## 2023-02-14 MED ORDER — INSULIN ASPART 100 UNIT/ML IJ SOLN: 0.0000 [IU] | Freq: Every day | INTRAMUSCULAR | Status: DC

## 2023-02-14 MED ORDER — CEFDINIR 300 MG PO CAPS: 300.0000 mg | ORAL_CAPSULE | Freq: Two times a day (BID) | ORAL | 0 refills | Status: AC

## 2023-02-14 MED ORDER — CEFDINIR 300 MG PO CAPS
300.0000 mg | ORAL_CAPSULE | Freq: Two times a day (BID) | ORAL | Status: DC
  Filled 2023-02-14: qty 1

## 2023-02-14 MED ORDER — FIASP FLEXTOUCH 100 UNIT/ML ~~LOC~~ SOPN: 8.0000 [IU] | PEN_INJECTOR | Freq: Three times a day (TID) | SUBCUTANEOUS | 0 refills | Status: DC

## 2023-02-14 MED ORDER — INSULIN ASPART 100 UNIT/ML IJ SOLN
3.0000 [IU] | Freq: Three times a day (TID) | INTRAMUSCULAR | Status: DC
  Administered 2023-02-14 (×2): 3 [IU] via SUBCUTANEOUS

## 2023-02-14 NOTE — Progress Notes (Signed)
Physical Therapy Treatment Patient Details Name: [name] MRN: [id number] DOB: July 25, 1951 Today's Date: 02/14/2023   History of Present Illness [name] is a 72 y.o. female with medical history significant for hypertension, hyperlipidemia, depression, insulin-dependent type 2 diabetes be admitted to the hospital for diabetic ketoacidosis    PT Comments    Patient more alert, ambulated with min guard x 400'. Patient to Dc home. Patient  encouraged to ambulate frequently after returning home. Patient will benefit from HHPT   Recommendations for follow up therapy are one component of a multi-disciplinary discharge planning process, led by the attending physician.  Recommendations may be updated based on patient status, additional functional criteria and insurance authorization.  Follow Up Recommendations       Assistance Recommended at Discharge Set up Supervision/Assistance  Patient can return home with the following A little help with walking and/or transfers;A little help with bathing/dressing/bathroom;Assistance with cooking/housework;Assist for transportation   Equipment Recommendations  None recommended by PT    Recommendations for Other Services       Precautions / Restrictions Precautions Precautions: Fall     Mobility  Bed Mobility Overal bed mobility: Independent Bed Mobility: Supine to Sit     Supine to sit: Min guard     General bed mobility comments: no support    Transfers Overall transfer level: Needs assistance Equipment used: None Transfers: Sit to/from Stand Sit to Stand: Supervision   Step pivot transfers: Min assist       General transfer comment: steady assistance    Ambulation/Gait Ambulation/Gait assistance: Min guard Gait Distance (Feet): 400 Feet Assistive device: 1 person hand held assist, None Gait Pattern/deviations: Step-through pattern Gait velocity: decr     General Gait Details: gait speed slow, no  balance loss , patient guarded , reported left knee pain with increased distance   Stairs             Wheelchair Mobility    Modified Rankin (Stroke Patients Only)       Balance Overall balance assessment: Mild deficits observed, not formally tested                                          Cognition Arousal/Alertness: Awake/alert Behavior During Therapy: WFL for tasks assessed/performed Overall Cognitive Status: Within Functional Limits for tasks assessed Area of Impairment: Orientation, Safety/judgement, Awareness                 Orientation Level: Time, Situation       Safety/Judgement: Decreased awareness of safety, Decreased awareness of deficits Awareness: Emergent            Exercises      General Comments        Pertinent Vitals/Pain Pain Assessment Pain Assessment: Faces Faces Pain Scale: Hurts little more Pain Location: left knee Pain Descriptors / Indicators: Discomfort Pain Intervention(s): Monitored during session    Home Living Family/patient expects to be discharged to:: Private residence Living Arrangements: Spouse/significant other Available Help at Discharge: Family;Friend(s) Type of Home: Mobile home Home Access: Stairs to enter Entrance Stairs-Rails: Right Entrance Stairs-Number of Steps: 2-3   Home Layout: One level Home Equipment: Conservation officer, nature (2 wheels)      Prior Function            PT Goals (current goals can now be found in the care plan section) Acute Rehab PT Goals  Patient Stated Goal: go home PT Goal Formulation: With patient Time For Goal Achievement: 02/28/23 Potential to Achieve Goals: Good Progress towards PT goals: Progressing toward goals    Frequency    Min 3X/week      PT Plan Current plan remains appropriate    Co-evaluation              AM-PAC PT "6 Clicks" Mobility   Outcome Measure  Help needed turning from your back to your side while in a flat bed  without using bedrails?: None Help needed moving from lying on your back to sitting on the side of a flat bed without using bedrails?: None Help needed moving to and from a bed to a chair (including a wheelchair)?: None Help needed standing up from a chair using your arms (e.g., wheelchair or bedside chair)?: A Little Help needed to walk in hospital room?: A Little Help needed climbing 3-5 steps with a railing? : A Little 6 Click Score: 21    End of Session Equipment Utilized During Treatment: Gait belt Activity Tolerance: Patient tolerated treatment well Patient left: in chair;with call bell/phone within reach;with chair alarm set Nurse Communication: Mobility status PT Visit Diagnosis: Unsteadiness on feet (R26.81);Difficulty in walking, not elsewhere classified (R26.2)     Time: [id number] PT Time Calculation (min) (ACUTE ONLY): 16 min  Charges:  $Gait Training: 8-22 mins                     [place] Office [phone] Weekend [id number]    Claretha Cooper 02/14/2023, 3:40 PM

## 2023-02-14 NOTE — TOC Initial Note (Addendum)
Transition of Care [facility]) - Initial/Assessment Note    Patient Details  Name: [name] MRN: [id number] Date of Birth: Sep 04, 1951  [facility]) CM/SW Contact:    Abigail Dine, RN Phone Number: 02/14/2023, 11:32 AM  Clinical Narrative:                 Pt confused; spoke w/ pt's POC/sister [name] [phone]); she says pt lives w/ her boyfriend; she plans for pt to return home at d/c; she says pt has glasses and dentures (upper/lower); she says pt does not have HA, DME, [place] services or home oxygen; pt's sister says pt has appt in April w' [place] [facility] for diabetes; no needs identified at present; TOC will sign off; please place consult if needed.  Expected Discharge Plan: Home/Self Care Barriers to Discharge: Continued Medical Work up   Patient Goals and CMS Choice Patient states their goals for this hospitalization and ongoing recovery are:: home per pt's sister [name]          Expected Discharge Plan and Services   Discharge Planning Services: CM Consult   Living arrangements for the past 2 months: Mobile Home                                      Prior Living Arrangements/Services Living arrangements for the past 2 months: Mobile Home Lives with:: Significant Other Patient language and need for interpreter reviewed:: Yes Do you feel safe going back to the place where you live?: Yes      Need for Family Participation in Patient Care: Yes (Comment) Care giver support system in place?: Yes (comment) Current home services:  (n/a) Criminal Activity/Legal Involvement Pertinent to Current Situation/Hospitalization: No - Comment as needed  Activities of Daily Living      Permission Sought/Granted Permission sought to share information with : Case Manager Permission granted to share information with : Yes, Verbal Permission Granted  Share Information with NAME: Abigail Coffin, RN, CM     Permission granted to share info  w Relationship: [name] (sister) [phone]     Emotional Assessment Appearance:: Other (Comment Required (unable to assess) Attitude/Demeanor/Rapport: Unable to Assess Affect (typically observed): Unable to Assess Orientation: :  (unable to assess) Alcohol / Substance Use: Not Applicable Psych Involvement: No (comment)  Admission diagnosis:  DKA (diabetic ketoacidosis) ([place]) [E11.10] AKI (acute kidney injury) ([place]) [N17.9] Diabetic ketoacidosis without coma associated with type 1 diabetes mellitus ([place]) [E10.10] Patient Active Problem List   Diagnosis Date Noted   DKA (diabetic ketoacidosis) ([place]) 02/13/2023   Pseudohyponatremia 02/13/2023   Elevated beta-hydroxybutyrate 02/13/2023   Tachycardia 02/13/2023   Protein calorie malnutrition ([place]) 02/13/2023   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 [place] [address] Phone: [phone] Fax: [place], [place] - [address] [place] [address] Phone: [phone] Fax: [place] [address] Phone: [phone] Fax: [phone]  Social Determinants of Health (SDOH) Social History: SDOH Screenings   Food Insecurity: No Food Insecurity (02/14/2023)  Housing: Low Risk  (02/14/2023)  Transportation Needs: No Transportation Needs (02/14/2023)  Utilities: Not At Risk (02/14/2023)  Depression (PHQ2-9): Low Risk  (02/07/2023)  Recent Concern: Depression (PHQ2-9) - Medium  Risk (12/18/2022)  Tobacco Use: Medium Risk (02/12/2023)   SDOH Interventions: Food Insecurity Interventions: Inpatient TOC Housing Interventions: Inpatient TOC Transportation Interventions: Inpatient TOC Utilities Interventions: Inpatient TOC   Readmission Risk Interventions    11/30/2020   11:08 AM 11/29/2020   10:33 AM  Readmission Risk Prevention Plan  Transportation Screening  Complete  PCP or Specialist Appt within 5-7 Days Complete   PCP or Specialist Appt within 3-5 Days  Complete  Home Care Screening Complete   Medication Review (RN CM) Complete   HRI or Home Care Consult  Complete  Social Work Consult for [place] Planning/Counseling  Complete  Palliative Care Screening  Not Applicable  Medication Review Press photographer)  Complete

## 2023-02-14 NOTE — Care Management Obs Status (Signed)
[place] NOTIFICATION   Patient Details  Name: [name] MRN: [id number] Date of Birth: 04-Oct-1951   Medicare Observation Status Notification Given:  Macario Golds, [place] 02/14/2023, 3:34 PM

## 2023-02-14 NOTE — Progress Notes (Signed)
Patient confused, unable to tell staff when she needs to urinate. Bed change x2 this shift. Urine visible pooled on legs. Patient cleaned and dried. Peri care provided. Purewick placed for skin protection and incontinence care

## 2023-02-14 NOTE — Progress Notes (Signed)
  Echocardiogram 2D Echocardiogram has been performed.  [name] 02/14/2023, 10:22 AM

## 2023-02-14 NOTE — Discharge Summary (Signed)
Physician Discharge Summary  [name] [id number] DOB: 04/01/1951 DOA: 02/12/2023  PCP: Vivi Barrack, MD  Admit date: 02/12/2023 Discharge date: 02/14/2023  Admitted From: home Disposition:  home  Recommendations for Outpatient Follow-up:  Follow up with PCP in 1 week Continue Cefdinir for 10 days  Home Health: PT Equipment/Devices: none  Discharge Condition: stable CODE STATUS: Full code Diet Orders (From admission, onward)     Start     Ordered   02/14/23 0647  Diet Carb Modified Fluid consistency: Thin; Room service appropriate? Yes  Diet effective now       Question Answer Comment  Diet-HS Snack? Nothing   Calorie Level Medium 1600-2000   Fluid consistency: Thin   Room service appropriate? Yes      02/14/23 0647            HPI: Per admitting MD, [name] is a 72 y.o. female with medical history significant for hypertension, hyperlipidemia, depression, insulin-dependent type 2 diabetes be admitted to the hospital for diabetic ketoacidosis.  History is provided by the patient and her husband who was at the bedside, he says she has been doing well, she started to feel little bit sick yesterday, with some slight nausea, but no fevers, chills no nausea, vomiting.  P.o. intake has been normal, no diarrhea.  Has been taking her insulin as directed, though patient and her husband admitted that she does not always check her blood sugar, and does not always remember to take her insulin as directed.  In any case, they state that she started having some chest pain last evening, central to her chest, worse with deep inspiration.  Denies any pressure-like sensation, has not had discomfort like this before.  She does not check her blood sugar at home, does not know what it was.  Came to the ER for evaluation of the chest pain.   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  She was placed on insulin infusion, with improvement in her CBGs, she was transitioned to subcutaneous insulin, has remained stable, eating, return to baseline and will be discharged home in stable condition.  Her insulins were refilled upon discharge  Active problems Otitis media -on my initial evaluation she did not complain of left ear pain, but later on the day she complained of significant left ear pain with movement of her ear and pressure.  On exam TM could not be visualized due to wax, there was no evidence of external canal infection so it is likely otitis media.  She is allergic to penicillin but tolerated cefdinir in the past, will place on a 10-day course. Chest pain -D-dimer essentially unremarkable, very low probability for PE.  Cardiac markers were flat, not in a pattern consistent with ACS.  She underwent a 2D echo which showed normal ejection fraction, no wall motion abnormalities so unlikely this was a cardiac event.  She is diabetic, has risk factors, and probably at 1  point would benefit from cardiology evaluation as an outpatient.  Continue aspirin Acute kidney injury -creatinine normalized with fluids Schizoaffective disorder -continue home medications on discharge Hyperlipidemia-continue atorvastatin Pseudohyponatremia-due to hyperglycemia  Sepsis ruled out   Discharge Instructions   Allergies as of 02/14/2023       Reactions   Penicillins Rash, Hives   Did it  involve swelling of the face/tongue/throat, SOB, or low BP? Unknown Did it involve sudden or severe rash/hives, skin peeling, or any reaction on the inside of your mouth or nose? Unknown Did you need to seek medical attention at a hospital or doctor's office? Unknown When did it last happen?       If all above answers are "NO", may proceed with cephalosporin use. Did it involve swelling of the face/tongue/throat, SOB, or low BP? Unknown Did it involve sudden or severe rash/hives, skin peeling, or any reaction on the inside of your mouth or nose? Unknown Did you need to seek medical attention at a hospital or doctor's office? Unknown When did it last happen?       If all above answers are "NO", may proceed with cephalosporin use.        Medication List     STOP taking these medications    terbinafine 250 MG tablet Commonly known as: LAMISIL       TAKE these medications    albuterol 108 (90 Base) MCG/ACT inhaler Commonly known as: VENTOLIN HFA TAKE 2 PUFFS BY MOUTH EVERY 6 HOURS AS NEEDED FOR WHEEZE OR SHORTNESS OF BREATH What changed: See the new instructions.   ARIPiprazole 30 MG tablet Commonly known as: ABILIFY Take 30 mg by mouth at bedtime.   aspirin EC 81 MG tablet Take 81 mg by mouth at bedtime.   atorvastatin 20 MG tablet Commonly known as: LIPITOR Take 20 mg by mouth daily.   benztropine 1 MG tablet Commonly known as: COGENTIN Take 1 mg by mouth at bedtime.   buPROPion 75 MG tablet Commonly known as: WELLBUTRIN Take 75 mg by mouth 2 (two) times daily.   cefdinir 300 MG capsule Commonly known as: OMNICEF Take 1 capsule (300 mg total) by mouth every 12 (twelve) hours for 10 days.   Cholecalciferol 50 MCG (2000 UT) Caps Take 1 capsule by mouth daily.   clotrimazole 1 % cream Commonly known as: LOTRIMIN Apply to affected area 2 times daily   clotrimazole-betamethasone cream Commonly known as: LOTRISONE Apply 1 Application topically daily.   Dexcom  G6 Receiver Devi Use as directed for continuous glucose monitoring.   diclofenac 75 MG EC tablet Commonly known as: VOLTAREN Take 1 tablet (75 mg total) by mouth 2 (two) times daily.   Fiasp FlexTouch 100 UNIT/ML FlexTouch Pen Generic drug: insulin aspart Inject 8 Units into the skin 3 (three) times daily before meals. What changed: See the new instructions.   FreeStyle Libre 2 Sensor Misc 1 each by Does not apply route every 14 (fourteen) days.   haloperidol 5 MG tablet Commonly known as: HALDOL Take 5 mg by mouth daily.   hydrOXYzine 25 MG tablet Commonly known as: ATARAX Take 25-50 mg by mouth at bedtime as needed for itching.   metoprolol succinate 25 MG 24 hr tablet Commonly known as: TOPROL-XL Take 0.5 tablets (12.5 mg total) by mouth daily. What changed: how much to take   mirtazapine 30 MG tablet Commonly known as: REMERON Take 30 mg by mouth at bedtime.   Oysco 500 500 MG Tabs Generic drug: [employer]  Shell Calcium Take 1 tablet (1,250 mg total) by mouth daily.   potassium chloride 10 MEQ tablet Commonly known as: KLOR-CON Take 2 tablets (20 mEq total) by mouth daily for 15 days.   Tyler Aas FlexTouch 100 UNIT/ML FlexTouch Pen Generic drug: insulin degludec Inject 38 Units into the skin daily.        Follow-up Information     Vivi Barrack, MD Follow up in 1 week(s).   Specialty: Family Medicine Contact information: [address] [place] [address] [phone]                 Consultations: none  Procedures/Studies:  ECHOCARDIOGRAM COMPLETE  Result Date: 02/14/2023    ECHOCARDIOGRAM REPORT   Patient Name:   [name] Date of Exam: 02/14/2023 Medical Rec #:  [id number]           Height:       65.0 in Accession #:    [id number]          Weight:       150.0 lb Date of Birth:  February 10, 1951           BSA:          1.750 m Patient Age:    89 years            BP:           127/60 mmHg Patient Gender: F                   HR:           80  bpm. Exam Location:  Inpatient Procedure: 2D Echo, Cardiac Doppler and Color Doppler Indications:    R07.9* Chest pain, unspecified  History:        Patient has prior history of Echocardiogram examinations, most                 recent 11/26/2020. Abnormal ECG, Arrythmias:Tachycardia and                 Cardiac Arrest; Risk Factors:Hypertension, Diabetes,                 Dyslipidemia and Current Smoker.  Sonographer:    Roseanna Rainbow RDCS Referring Phys: [place]  1. Left ventricular ejection fraction, by estimation, is 60 to 65%. The left ventricle has normal function. The left ventricle has no regional wall motion abnormalities. Left ventricular diastolic parameters are consistent with Grade I diastolic dysfunction (impaired relaxation). Elevated left atrial pressure.  2. Right ventricular systolic function is normal. The right ventricular size is normal.  3. The mitral valve is normal in structure. No evidence of mitral valve regurgitation. No evidence of mitral stenosis.  4. The aortic valve has an indeterminant number of cusps. There is mild calcification of the aortic valve. There is mild thickening of the aortic valve. Aortic valve regurgitation is mild to moderate. No aortic stenosis is present.  5. Aortic dilatation noted. 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  6. The inferior vena cava is normal in size with greater than 50% respiratory variability, suggesting right atrial pressure of 3 mmHg. FINDINGS  Left Ventricle: Left ventricular ejection fraction, by estimation, is 60 to 65%. The left ventricle has normal function. The left ventricle has no regional wall motion abnormalities. The left ventricular internal cavity size was normal in size. There is  no left ventricular hypertrophy. Left ventricular diastolic parameters are consistent with Grade  I diastolic dysfunction (impaired relaxation). Elevated left atrial pressure. Right Ventricle: The right  ventricular size is normal. Right vetricular wall thickness was not well visualized. Right ventricular systolic function is normal. Left Atrium: Left atrial size was normal in size. Right Atrium: Right atrial size was normal in size. Pericardium: There is no evidence of pericardial effusion. Mitral Valve: The mitral valve is normal in structure. No evidence of mitral valve regurgitation. No evidence of mitral valve stenosis. Tricuspid Valve: The tricuspid valve is normal in structure. Tricuspid valve regurgitation is not demonstrated. No evidence of tricuspid stenosis. Aortic Valve: The aortic valve has an indeterminant number of cusps. There is mild calcification of the aortic valve. There is mild thickening of the aortic valve. There is mild aortic valve annular calcification. Aortic valve regurgitation is mild to moderate. Aortic regurgitation PHT measures 619 msec. No aortic stenosis is present. Aortic valve mean gradient measures 8.0 mmHg. Aortic valve peak gradient measures 15.5 mmHg. Aortic valve area, by VTI measures 2.78 cm. Pulmonic Valve: The pulmonic valve was not well visualized. Pulmonic valve regurgitation is not visualized. No evidence of pulmonic stenosis. Aorta: Aortic dilatation noted and the aortic root is normal in size and structure. 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 Venous: The inferior vena cava is normal in size with greater than 50% respiratory variability, suggesting right atrial pressure of 3 mmHg. IAS/Shunts: No atrial level shunt detected by color flow Doppler.  LEFT VENTRICLE PLAX 2D LVIDd:         4.30 cm     Diastology LVIDs:         3.30 cm     LV e' medial:    3.92 cm/s LV PW:         1.10 cm     LV E/e' medial:  19.8 LV IVS:        1.00 cm     LV e' lateral:   6.31 cm/s LVOT diam:     2.20 cm     LV E/e' lateral: 12.3 LV SV:         100 LV SV Index:   57 LVOT Area:     3.80 cm  LV Volumes (MOD) LV vol d, MOD A2C: 81.7 ml LV vol d, MOD A4C: 74.6 ml  LV vol s, MOD A2C: 30.2 ml LV vol s, MOD A4C: 32.5 ml LV SV MOD A2C:     51.5 ml LV SV MOD A4C:     74.6 ml LV SV MOD BP:      49.2 ml RIGHT VENTRICLE             IVC RV S prime:     18.00 cm/s  IVC diam: 2.20 cm TAPSE (M-mode): 2.0 cm LEFT ATRIUM             Index        RIGHT ATRIUM           Index LA diam:        3.40 cm 1.94 cm/m   RA Area:     10.40 cm LA Vol (A2C):   46.1 ml 26.34 ml/m  RA Volume:   17.10 ml  9.77 ml/m LA Vol (A4C):   24.3 ml 13.88 ml/m LA Biplane Vol: 33.7 ml 19.25 ml/m  AORTIC VALVE AV Area (Vmax):    2.49 cm AV Area (Vmean):   2.55 cm AV Area (VTI):     2.78 cm AV Vmax:  197.00 cm/s AV Vmean:          129.000 cm/s AV VTI:            0.361 m AV Peak Grad:      15.5 mmHg AV Mean Grad:      8.0 mmHg LVOT Vmax:         129.00 cm/s LVOT Vmean:        86.700 cm/s LVOT VTI:          0.264 m LVOT/AV VTI ratio: 0.73 AI PHT:            619 msec  AORTA Ao Root diam: 3.00 cm Ao Asc diam:  3.60 cm MITRAL VALVE MV Area (PHT): 3.37 cm     SHUNTS MV Decel Time: 225 msec     Systemic VTI:  0.26 m MV E velocity: 77.60 cm/s   Systemic Diam: 2.20 cm MV A velocity: 115.00 cm/s MV E/A ratio:  0.67 Carlyle Dolly MD Electronically signed by Carlyle Dolly MD Signature Date/Time: 02/14/2023/2:36:41 PM    Final    CT HEAD WO CONTRAST (5MM)  Result Date: 02/13/2023 CLINICAL DATA:  Mental status change, unknown cause EXAM: CT HEAD WITHOUT CONTRAST TECHNIQUE: Contiguous axial images were obtained from the base of the skull through the vertex without intravenous contrast. RADIATION DOSE REDUCTION: This exam was performed according to the departmental dose-optimization program which includes automated exposure control, adjustment of the mA and/or kV according to patient size and/or use of iterative reconstruction technique. COMPARISON:  CT head 11/24/2020. 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 No evidence of acute large vascular territory  infarct, acute hemorrhage, mass lesion midline shift. Vascular: No hyperdense vessel identified. Skull: No acute fracture. Sinuses/Orbits: No acute orbital findings. Opacified right ethmoid air cell. Other: No mastoid effusions. IMPRESSION: 1. No evidence of acute intracranial abnormality. 2. Remote right parietal infarct. Electronically Signed   By: Margaretha Sheffield M.D.   On: 02/13/2023 12:52   DG Knee Complete 4 Views Left  Result Date: 02/12/2023 CLINICAL DATA:  Fall 2 days ago with knee pain, initial encounter EXAM: LEFT KNEE - COMPLETE 4+ VIEW COMPARISON:  None Available. FINDINGS: No evidence of fracture, dislocation, or joint effusion. No evidence of arthropathy or other focal bone abnormality. Soft tissues are unremarkable. IMPRESSION: No acute abnormality noted. Electronically Signed   By: Inez Catalina M.D.   On: 02/12/2023 22:43   DG FEMUR MIN 2 VIEWS LEFT  Result Date: 02/12/2023 CLINICAL DATA:  Fall 2 days ago with left leg pain, initial encounter EXAM: LEFT FEMUR 2 VIEWS COMPARISON:  None Available. FINDINGS: No acute fracture or dislocation is noted. No acute soft tissue abnormality is noted. Calcified uterine fibroid is noted in the left hemipelvis. IMPRESSION: No acute abnormality noted. Electronically Signed   By: Inez Catalina M.D.   On: 02/12/2023 22:41   DG Chest 2 View  Result Date: 02/12/2023 CLINICAL DATA:  Chest pain EXAM: CHEST - 2 VIEW COMPARISON:  11/27/2020 FINDINGS: Mild scarring in the right upper lung. No acute airspace disease or effusion. Stable cardiomediastinal silhouette. No pneumothorax. IMPRESSION: No active cardiopulmonary disease. Electronically Signed   By: Donavan Foil M.D.   On: 02/12/2023 22:06   DG Knee Complete 4 Views Left  Result Date: 02/11/2023 CLINICAL DATA:  Pain EXAM: LEFT KNEE - COMPLETE 4+ VIEW COMPARISON:  None Available. FINDINGS: Apparent soft tissue swelling. No joint effusion. No fracture or dislocation. No bony lesion or bony erosion.  No  significant degenerative change identified. Mild enthesopathic changes off the superior patella. IMPRESSION: Apparent soft tissue swelling. No other significant abnormalities. Electronically Signed   By: Dorise Bullion III M.D.   On: 02/11/2023 12:49     Subjective: - no chest pain, shortness of breath, no abdominal pain, nausea or vomiting.   Discharge Exam: BP (!) 143/60   Pulse 78   Temp 98.5 F (36.9 C) (Oral)   Resp 14   Ht 5\' 5"  (1.651 m)   Wt 68 kg   SpO2 98%   BMI 24.96 kg/m   General: Pt is alert, awake, not in acute distress Cardiovascular: RRR, S1/S2 +, no rubs, no gallops Respiratory: CTA bilaterally, no wheezing, no rhonchi Abdominal: Soft, NT, ND, bowel sounds + ENT: TM could not be visualized due to wax deposition, external auditory canal without erythema, ear tender to palpation anteriorly Extremities: no edema, no cyanosis    The results of significant diagnostics from this hospitalization (including imaging, microbiology, ancillary and laboratory) are listed below for reference.     Microbiology: Recent Results (from the past 240 hour(s))  MRSA Next Gen by PCR, Nasal     Status: None   Collection Time: 02/13/23  1:09 PM   Specimen: Nasal Mucosa; Nasal Swab  Result Value Ref Range Status   MRSA by PCR Next Gen NOT DETECTED NOT DETECTED Final    Comment: (NOTE) The GeneXpert MRSA Assay (FDA approved for NASAL specimens only), is one component of a comprehensive MRSA colonization surveillance program. It is not intended to diagnose MRSA infection nor to guide or monitor treatment for MRSA infections. Test performance is not FDA approved in patients less than 36 years old. Performed at [facility], [place] [address] [place], [place] [address]      Labs: Basic Metabolic Panel: Recent Labs  Lab 02/13/23 0921 02/13/23 1215 02/13/23 1609 02/13/23 2033 02/14/23 0058  NA 134* 135 135 137 134*  K 4.1 3.9 3.6 3.5 3.5  CL 106 108 108  109 106  CO2 18* 19* 19* 21* 23  GLUCOSE 184* 157* 147* 161* 163*  BUN 31* 28* 24* 23 18  CREATININE 0.96 0.78 0.68 0.54 0.61  CALCIUM 8.8* 8.9 8.5* 8.6* 8.6*   Liver Function Tests: No results for input(s): "AST", "ALT", "ALKPHOS", "BILITOT", "PROT", "ALBUMIN" in the last 168 hours. CBC: Recent Labs  Lab 02/13/23 0156 02/13/23 0921  WBC 9.5 6.4  HGB 13.3 11.2*  HCT 41.6 34.0*  MCV 81.1 79.8*  PLT 261 177   CBG: Recent Labs  Lab 02/14/23 0434 02/14/23 0628 02/14/23 0730 02/14/23 0834 02/14/23 1118  GLUCAP 152* 134* 91 110* 249*   Hgb A1c Recent Labs    02/13/23 0921  HGBA1C 14.1*   Lipid Profile No results for input(s): "CHOL", "HDL", "LDLCALC", "TRIG", "CHOLHDL", "LDLDIRECT" in the last 72 hours. Thyroid function studies No results for input(s): "TSH", "T4TOTAL", "T3FREE", "THYROIDAB" in the last 72 hours.  Invalid input(s): "FREET3" Urinalysis    Component Value Date/Time   COLORURINE YELLOW 06/21/2022 [place] 06/21/2022 1438   LABSPEC 1.020 06/21/2022 1438   PHURINE 5.5 06/21/2022 1438   GLUCOSEU >=1000 (A) 06/21/2022 1438   HGBUR TRACE-INTACT (A) 06/21/2022 1438   BILIRUBINUR neg 08/07/2022 0932   KETONESUR TRACE (A) 06/21/2022 1438   PROTEINUR Negative 08/07/2022 0932   PROTEINUR 100 (A) 11/24/2020 1236   UROBILINOGEN 0.2 08/07/2022 0932   UROBILINOGEN 1.0 06/21/2022 1438   NITRITE postive 08/07/2022 0932  NITRITE NEGATIVE 06/21/2022 1438   LEUKOCYTESUR Trace (A) 08/07/2022 0932   LEUKOCYTESUR SMALL (A) 06/21/2022 1438    FURTHER DISCHARGE INSTRUCTIONS:   Get Medicines reviewed and adjusted: Please take all your medications with you for your next visit with your Primary MD   Laboratory/radiological data: Please request your Primary MD to go over all hospital tests and procedure/radiological results at the follow up, please ask your Primary MD to get all Hospital records sent to his/her office.   In some cases, they will be  blood work, cultures and biopsy results pending at the time of your discharge. Please request that your primary care M.D. goes through all the records of your hospital data and follows up on these results.   Also Note the following: If you experience worsening of your admission symptoms, develop shortness of breath, life threatening emergency, suicidal or homicidal thoughts you must seek medical attention immediately by calling 911 or calling your MD immediately  if symptoms less severe.   You must read complete instructions/literature along with all the possible adverse reactions/side effects for all the Medicines you take and that have been prescribed to you. Take any new Medicines after you have completely understood and accpet all the possible adverse reactions/side effects.    Do not drive when taking Pain medications or sleeping medications (Benzodaizepines)   Do not take more than prescribed Pain, Sleep and Anxiety Medications. It is not advisable to combine anxiety,sleep and pain medications without talking with your primary care practitioner   Special Instructions: If you have smoked or chewed Tobacco  in the last 2 yrs please stop smoking, stop any regular Alcohol  and or any Recreational drug use.   Wear Seat belts while driving.   Please note: You were cared for by a hospitalist during your hospital stay. Once you are discharged, your primary care physician will handle any further medical issues. Please note that NO REFILLS for any discharge medications will be authorized once you are discharged, as it is imperative that you return to your primary care physician (or establish a relationship with a primary care physician if you do not have one) for your post hospital discharge needs so that they can reassess your need for medications and monitor your lab values.  Time coordinating discharge: 40 minutes  SIGNED:  Marzetta Board, MD, PhD 02/14/2023, 3:10 PM

## 2023-02-14 NOTE — Evaluation (Signed)
Physical Therapy Evaluation Patient Details Name: [name] MRN: [id number] DOB: 1951/11/02 Today's Date: 02/14/2023  History of Present Illness  [name] is a 72 y.o. female with medical history significant for hypertension, hyperlipidemia, depression, insulin-dependent type 2 diabetes be admitted to the hospital for diabetic ketoacidosis  Clinical Impression  Pt admitted with above diagnosis.  Pt currently with functional limitations due to the deficits listed below (see PT Problem List). Pt will benefit from acute skilled PT to increase their independence and safety with mobility to allow discharge.   The patient is oriented to place , does not recall how she got to hospital. Patient able to mobilize to reclner. Will need further  PT in acute to return to  supervision level .  No family present at this time.          Recommendations for follow up therapy are one component of a multi-disciplinary discharge planning process, led by the attending physician.  Recommendations may be updated based on patient status, additional functional criteria and insurance authorization.  Follow Up Recommendations       Assistance Recommended at Discharge Set up Supervision/Assistance  Patient can return home with the following  A little help with walking and/or transfers;A little help with bathing/dressing/bathroom;Assistance with cooking/housework;Assist for transportation    Equipment Recommendations None recommended by PT  Recommendations for Other Services       Functional Status Assessment Patient has had a recent decline in their functional status and demonstrates the ability to make significant improvements in function in a reasonable and predictable amount of time.     Precautions / Restrictions Precautions Precautions: Fall      Mobility  Bed Mobility Overal bed mobility: Needs Assistance Bed Mobility: Supine to Sit     Supine to sit: Min guard     General  bed mobility comments: no support    Transfers Overall transfer level: Needs assistance Equipment used: Rolling walker (2 wheels) Transfers: Sit to/from Stand, Bed to chair/wheelchair/BSC Sit to Stand: Min assist   Step pivot transfers: Min assist       General transfer comment: steady assistance    Ambulation/Gait                  Stairs            Wheelchair Mobility    Modified Rankin (Stroke Patients Only)       Balance Overall balance assessment: Mild deficits observed, not formally tested                                           Pertinent Vitals/Pain Pain Assessment Pain Assessment: No/denies pain    Home Living Family/patient expects to be discharged to:: Private residence Living Arrangements: Spouse/significant other Available Help at Discharge: Family;Friend(s) Type of Home: Mobile home Home Access: Stairs to enter Entrance Stairs-Rails: Right Entrance Stairs-Number of Steps: 2-3   Home Layout: One level Home Equipment: Conservation officer, nature (2 wheels)      Prior Function Prior Level of Function : Independent/Modified Independent                     Hand Dominance   Dominant Hand: Right    Extremity/Trunk Assessment   Upper Extremity Assessment Upper Extremity Assessment: Overall WFL for tasks assessed    Lower Extremity Assessment Lower Extremity Assessment: Generalized weakness  Cervical / Trunk Assessment Cervical / Trunk Assessment: Normal  Communication   Communication: No difficulties  Cognition Arousal/Alertness: Awake/alert Behavior During Therapy: WFL for tasks assessed/performed Overall Cognitive Status: Impaired/Different from baseline Area of Impairment: Orientation, Safety/judgement, Awareness                 Orientation Level: Time, Situation       Safety/Judgement: Decreased awareness of safety, Decreased awareness of deficits Awareness: Emergent            General  Comments      Exercises     Assessment/Plan    PT Assessment Patient needs continued PT services  PT Problem List Decreased strength;Decreased activity tolerance;Decreased safety awareness;Decreased knowledge of use of DME;Decreased knowledge of precautions       PT Treatment Interventions DME instruction;Stair training;Therapeutic activities;Gait training;Functional mobility training;Therapeutic exercise;Patient/family education    PT Goals (Current goals can be found in the Care Plan section)  Acute Rehab PT Goals Patient Stated Goal: go home PT Goal Formulation: With patient Time For Goal Achievement: 02/28/23 Potential to Achieve Goals: Good    Frequency Min 3X/week     Co-evaluation               AM-PAC PT "6 Clicks" Mobility  Outcome Measure Help needed turning from your back to your side while in a flat bed without using bedrails?: None Help needed moving from lying on your back to sitting on the side of a flat bed without using bedrails?: None Help needed moving to and from a bed to a chair (including a wheelchair)?: None Help needed standing up from a chair using your arms (e.g., wheelchair or bedside chair)?: A Little Help needed to walk in hospital room?: A Lot Help needed climbing 3-5 steps with a railing? : A Lot 6 Click Score: 19    End of Session Equipment Utilized During Treatment: Gait belt Activity Tolerance: Patient tolerated treatment well Patient left: in chair;with call bell/phone within reach;with chair alarm set Nurse Communication: Mobility status PT Visit Diagnosis: Unsteadiness on feet (R26.81);Difficulty in walking, not elsewhere classified (R26.2)    Time: [id number] PT Time Calculation (min) (ACUTE ONLY): 20 min   Charges:   PT Evaluation $PT Eval Low Complexity: [place] Office [phone] Weekend [id number]   Claretha Cooper 02/14/2023, 1:08 PM

## 2023-02-14 NOTE — Care Management CC44 (Signed)
Condition Code 44 Documentation Completed  Patient Details  Name: [name] MRN: [id number] Date of Birth: 05/03/1951   Condition Code 44 given:  Yes Patient signature on Condition Code 44 notice:  Yes Documentation of 2 MD's agreement:  Yes Code 44 added to claim:  Yes    Anntoinette Haefele, LCSW 02/14/2023, 3:34 PM

## 2023-02-14 NOTE — Discharge Instructions (Signed)
Follow with Vivi Barrack, MD in 5-7 days  Please get a complete blood count and chemistry panel checked by your Primary MD at your next visit, and again as instructed by your Primary MD. Please get your medications reviewed and adjusted by your Primary MD.  Please request your Primary MD to go over all Hospital Tests and Procedure/Radiological results at the follow up, please get all Hospital records sent to your Prim MD by signing hospital release before you go home.  In some cases, there will be blood work, cultures and biopsy results pending at the time of your discharge. Please request that your primary care M.D. goes through all the records of your hospital data and follows up on these results.  If you had Pneumonia of Lung problems at the Hospital: Please get a 2 view Chest X ray done in 6-8 weeks after hospital discharge or sooner if instructed by your Primary MD.  If you have Congestive Heart Failure: Please call your Cardiologist or Primary MD anytime you have any of the following symptoms:  1) 3 pound weight gain in 24 hours or 5 pounds in 1 week  2) shortness of breath, with or without a dry hacking cough  3) swelling in the hands, feet or stomach  4) if you have to sleep on extra pillows at night in order to breathe  Follow cardiac low salt diet and 1.5 lit/day fluid restriction.  If you have diabetes Accuchecks 4 times/day, Once in AM empty stomach and then before each meal. Log in all results and show them to your primary doctor at your next visit. If any glucose reading is under 80 or above 300 call your primary MD immediately.  If you have Seizure/Convulsions/Epilepsy: Please do not drive, operate heavy machinery, participate in activities at heights or participate in high speed sports until you have seen by Primary MD or a Neurologist and advised to do so again. Per [facility] statutes, patients with seizures are not allowed to drive until they have been  seizure-free for six months.  Use caution when using heavy equipment or power tools. Avoid working on ladders or at heights. Take showers instead of baths. Ensure the water temperature is not too high on the home water heater. Do not go swimming alone. Do not lock yourself in a room alone (i.e. bathroom). When caring for infants or small children, sit down when holding, feeding, or changing them to minimize risk of injury to the child in the event you have a seizure. Maintain good sleep hygiene. Avoid alcohol.   If you had Gastrointestinal Bleeding: Please ask your Primary MD to check a complete blood count within one week of discharge or at your next visit. Your endoscopic/colonoscopic biopsies that are pending at the time of discharge, will also need to followed by your Primary MD.  Get Medicines reviewed and adjusted. Please take all your medications with you for your next visit with your Primary MD  Please request your Primary MD to go over all hospital tests and procedure/radiological results at the follow up, please ask your Primary MD to get all Hospital records sent to his/her office.  If you experience worsening of your admission symptoms, develop shortness of breath, life threatening emergency, suicidal or homicidal thoughts you must seek medical attention immediately by calling 911 or calling your MD immediately  if symptoms less severe.  You must read complete instructions/literature along with all the possible adverse reactions/side effects for all the Medicines you  take and that have been prescribed to you. Take any new Medicines after you have completely understood and accpet all the possible adverse reactions/side effects.   Do not drive or operate heavy machinery when taking Pain medications.   Do not take more than prescribed Pain, Sleep and Anxiety Medications  Special Instructions: If you have smoked or chewed Tobacco  in the last 2 yrs please stop smoking, stop any regular  Alcohol  and or any Recreational drug use.  Wear Seat belts while driving.  Please note You were cared for by a hospitalist during your hospital stay. If you have any questions about your discharge medications or the care you received while you were in the hospital after you are discharged, you can call the unit and asked to speak with the hospitalist on call if the hospitalist that took care of you is not available. Once you are discharged, your primary care physician will handle any further medical issues. Please note that NO REFILLS for any discharge medications will be authorized once you are discharged, as it is imperative that you return to your primary care physician (or establish a relationship with a primary care physician if you do not have one) for your aftercare needs so that they can reassess your need for medications and monitor your lab values.  You can reach the hospitalist office at phone [phone] or fax [phone]   If you do not have a primary care physician, you can call [phone] for a physician referral.  Activity: As tolerated with Full fall precautions use walker/cane & assistance as needed    Diet: diabetic  Disposition Home

## 2023-02-21 ENCOUNTER — Telehealth: Payer: Self-pay | Admitting: Family Medicine

## 2023-02-21 NOTE — Telephone Encounter (Signed)
Copied from [place] [id number]. Topic: Medicare AWV >> Feb 21, 2023  1:08 PM Gillis Santa wrote: Reason for CRM: Called patient to schedule Medicare Annual Wellness Visit (AWV). Left message for patient to call back and schedule Medicare Annual Wellness Visit (AWV).  Last date of AWV: N/A  Please schedule an appointment at any time with Otila Kluver, [facility]. Please schedule AWVS with Otila Kluver, [place].  If any questions, please contact me at [phone].  Thank you ,  Shaune Pollack [facility] AWV TEAM Direct Dial [phone]

## 2023-02-25 ENCOUNTER — Inpatient Hospital Stay (HOSPITAL_COMMUNITY)
Admission: EM | Admit: 2023-02-25 | Discharge: 2023-03-02 | DRG: 637 | Disposition: A | Discharge status: Home / Self Care | Payer: Medicare Other | Attending: Family Medicine | Admitting: Family Medicine

## 2023-02-25 ENCOUNTER — Emergency Department (HOSPITAL_COMMUNITY): Payer: Medicare Other

## 2023-02-25 DIAGNOSIS — Z823 Family history of stroke: Secondary | ICD-10-CM

## 2023-02-25 DIAGNOSIS — Z833 Family history of diabetes mellitus: Secondary | ICD-10-CM

## 2023-02-25 DIAGNOSIS — Z87891 Personal history of nicotine dependence: Secondary | ICD-10-CM

## 2023-02-25 DIAGNOSIS — I10 Essential (primary) hypertension: Secondary | ICD-10-CM | POA: Diagnosis present

## 2023-02-25 DIAGNOSIS — E11649 Type 2 diabetes mellitus with hypoglycemia without coma: Secondary | ICD-10-CM | POA: Diagnosis not present

## 2023-02-25 DIAGNOSIS — Z1152 Encounter for screening for COVID-19: Secondary | ICD-10-CM

## 2023-02-25 DIAGNOSIS — Z8673 Personal history of transient ischemic attack (TIA), and cerebral infarction without residual deficits: Secondary | ICD-10-CM | POA: Diagnosis not present

## 2023-02-25 DIAGNOSIS — F209 Schizophrenia, unspecified: Secondary | ICD-10-CM | POA: Diagnosis present

## 2023-02-25 DIAGNOSIS — E871 Hypo-osmolality and hyponatremia: Secondary | ICD-10-CM | POA: Diagnosis present

## 2023-02-25 DIAGNOSIS — G928 Other toxic encephalopathy: Secondary | ICD-10-CM | POA: Diagnosis present

## 2023-02-25 DIAGNOSIS — N3000 Acute cystitis without hematuria: Secondary | ICD-10-CM

## 2023-02-25 DIAGNOSIS — Z7982 Long term (current) use of aspirin: Secondary | ICD-10-CM | POA: Diagnosis not present

## 2023-02-25 DIAGNOSIS — Z880 Allergy status to penicillin: Secondary | ICD-10-CM | POA: Diagnosis not present

## 2023-02-25 DIAGNOSIS — N179 Acute kidney failure, unspecified: Secondary | ICD-10-CM

## 2023-02-25 DIAGNOSIS — M810 Age-related osteoporosis without current pathological fracture: Secondary | ICD-10-CM | POA: Diagnosis present

## 2023-02-25 DIAGNOSIS — Z825 Family history of asthma and other chronic lower respiratory diseases: Secondary | ICD-10-CM | POA: Diagnosis not present

## 2023-02-25 DIAGNOSIS — Z79899 Other long term (current) drug therapy: Secondary | ICD-10-CM | POA: Diagnosis not present

## 2023-02-25 DIAGNOSIS — E785 Hyperlipidemia, unspecified: Secondary | ICD-10-CM | POA: Diagnosis present

## 2023-02-25 DIAGNOSIS — R739 Hyperglycemia, unspecified: Secondary | ICD-10-CM | POA: Diagnosis present

## 2023-02-25 DIAGNOSIS — Z8249 Family history of ischemic heart disease and other diseases of the circulatory system: Secondary | ICD-10-CM | POA: Diagnosis not present

## 2023-02-25 DIAGNOSIS — E1165 Type 2 diabetes mellitus with hyperglycemia: Secondary | ICD-10-CM | POA: Diagnosis not present

## 2023-02-25 DIAGNOSIS — N390 Urinary tract infection, site not specified: Secondary | ICD-10-CM | POA: Diagnosis present

## 2023-02-25 DIAGNOSIS — Z794 Long term (current) use of insulin: Secondary | ICD-10-CM

## 2023-02-25 DIAGNOSIS — Z91199 Patient's noncompliance with other medical treatment and regimen due to unspecified reason: Secondary | ICD-10-CM

## 2023-02-25 DIAGNOSIS — G9340 Encephalopathy, unspecified: Secondary | ICD-10-CM

## 2023-02-25 DIAGNOSIS — E860 Dehydration: Secondary | ICD-10-CM | POA: Diagnosis present

## 2023-02-25 DIAGNOSIS — T383X6A Underdosing of insulin and oral hypoglycemic [antidiabetic] drugs, initial encounter: Secondary | ICD-10-CM | POA: Diagnosis present

## 2023-02-25 LAB — URINALYSIS, ROUTINE W REFLEX MICROSCOPIC
Bilirubin Urine: NEGATIVE
Glucose, UA: >=500 mg/dL — AB
Ketones, ur: 20 mg/dL — AB
Nitrite: NEGATIVE
Protein, ur: 100 mg/dL — AB
Specific Gravity, Urine: 1.023 (ref 1.005–1.030)
pH: 5 (ref 5.0–8.0)

## 2023-02-25 LAB — I-STAT VENOUS BLOOD GAS, ED
Acid-base deficit: 7 mmol/L — ABNORMAL HIGH (ref 0.0–2.0)
Bicarbonate: 17.8 mmol/L — ABNORMAL LOW (ref 20.0–28.0)
Calcium, Ion: 1.25 mmol/L (ref 1.15–1.40)
HCT: 35 % — ABNORMAL LOW (ref 36.0–46.0)
Hemoglobin: 11.9 g/dL — ABNORMAL LOW (ref 12.0–15.0)
O2 Saturation: 99 %
Potassium: 4.6 mmol/L (ref 3.5–5.1)
Sodium: 127 mmol/L — ABNORMAL LOW (ref 135–145)
TCO2: 19 mmol/L — ABNORMAL LOW (ref 22–32)
pCO2, Ven: 34 mmHg — ABNORMAL LOW (ref 44–60)
pH, Ven: 7.327 (ref 7.25–7.43)
pO2, Ven: 144 mmHg — ABNORMAL HIGH (ref 32–45)

## 2023-02-25 LAB — COMPREHENSIVE METABOLIC PANEL
ALT: 13 U/L (ref 0–44)
AST: 18 U/L (ref 15–41)
Albumin: 3.3 g/dL — ABNORMAL LOW (ref 3.5–5.0)
Alkaline Phosphatase: 130 U/L — ABNORMAL HIGH (ref 38–126)
Anion gap: 11 (ref 5–15)
BUN: 32 mg/dL — ABNORMAL HIGH (ref 8–23)
CO2: 18 mmol/L — ABNORMAL LOW (ref 22–32)
Calcium: 9.6 mg/dL (ref 8.9–10.3)
Chloride: 98 mmol/L (ref 98–111)
Creatinine, Ser: 1.34 mg/dL — ABNORMAL HIGH (ref 0.44–1.00)
GFR, Estimated: 43 mL/min — ABNORMAL LOW (ref >60)
Glucose, Bld: 452 mg/dL — ABNORMAL HIGH (ref 70–99)
Potassium: 4.6 mmol/L (ref 3.5–5.1)
Sodium: 127 mmol/L — ABNORMAL LOW (ref 135–145)
Total Bilirubin: 1.2 mg/dL (ref 0.3–1.2)
Total Protein: 6.9 g/dL (ref 6.5–8.1)

## 2023-02-25 LAB — CBC
HCT: 36.7 % (ref 36.0–46.0)
Hemoglobin: 11.8 g/dL — ABNORMAL LOW (ref 12.0–15.0)
MCH: 26.3 pg (ref 26.0–34.0)
MCHC: 32.2 g/dL (ref 30.0–36.0)
MCV: 81.7 fL (ref 80.0–100.0)
Platelets: 158 10*3/uL (ref 150–400)
RBC: 4.49 MIL/uL (ref 3.87–5.11)
RDW: 16.9 % — ABNORMAL HIGH (ref 11.5–15.5)
WBC: 5.5 10*3/uL (ref 4.0–10.5)
nRBC: 0 % (ref 0.0–0.2)

## 2023-02-25 LAB — CBG MONITORING, ED
Glucose-Capillary: 420 mg/dL — ABNORMAL HIGH (ref 70–99)
Glucose-Capillary: 283 mg/dL — ABNORMAL HIGH (ref 70–99)

## 2023-02-25 LAB — GLUCOSE, CAPILLARY
Glucose-Capillary: 437 mg/dL — ABNORMAL HIGH (ref 70–99)
Glucose-Capillary: 378 mg/dL — ABNORMAL HIGH (ref 70–99)
Glucose-Capillary: 426 mg/dL — ABNORMAL HIGH (ref 70–99)

## 2023-02-25 LAB — SARS CORONAVIRUS 2 BY RT PCR: SARS Coronavirus 2 by RT PCR: NEGATIVE

## 2023-02-25 MED ORDER — SODIUM CHLORIDE 0.9 % IV SOLN
1.0000 g | Freq: Once | INTRAVENOUS | Status: AC
  Administered 2023-02-25: 1 g via INTRAVENOUS
  Filled 2023-02-25: qty 10

## 2023-02-25 MED ORDER — INSULIN ASPART 100 UNIT/ML IJ SOLN: 0.0000 [IU] | Freq: Three times a day (TID) | INTRAMUSCULAR | Status: DC

## 2023-02-25 MED ORDER — LACTATED RINGERS IV SOLN: INTRAVENOUS | Status: DC

## 2023-02-25 MED ORDER — INSULIN DETEMIR 100 UNIT/ML ~~LOC~~ SOLN
35.0000 [IU] | Freq: Every day | SUBCUTANEOUS | Status: DC
  Filled 2023-02-25: qty 0.35

## 2023-02-25 MED ORDER — ACETAMINOPHEN 325 MG PO TABS: 650.0000 mg | ORAL_TABLET | Freq: Four times a day (QID) | ORAL | Status: DC | PRN

## 2023-02-25 MED ORDER — INSULIN ASPART 100 UNIT/ML IJ SOLN
5.0000 [IU] | Freq: Once | INTRAMUSCULAR | Status: AC
  Administered 2023-02-25: 5 [IU] via SUBCUTANEOUS

## 2023-02-25 MED ORDER — INSULIN ASPART 100 UNIT/ML IJ SOLN
10.0000 [IU] | Freq: Once | INTRAMUSCULAR | Status: AC
  Administered 2023-02-25: 10 [IU] via INTRAVENOUS

## 2023-02-25 MED ORDER — INSULIN ASPART 100 UNIT/ML IJ SOLN
0.0000 [IU] | Freq: Every day | INTRAMUSCULAR | Status: DC
  Administered 2023-02-25 – 2023-02-26 (×2): 5 [IU] via SUBCUTANEOUS

## 2023-02-25 MED ORDER — ACETAMINOPHEN 650 MG RE SUPP: 650.0000 mg | Freq: Four times a day (QID) | RECTAL | Status: DC | PRN

## 2023-02-25 MED ORDER — SODIUM CHLORIDE 0.9 % IV BOLUS
500.0000 mL | Freq: Once | INTRAVENOUS | Status: AC
  Administered 2023-02-25: 500 mL via INTRAVENOUS

## 2023-02-25 MED ORDER — ENOXAPARIN SODIUM 40 MG/0.4ML IJ SOSY
40.0000 mg | PREFILLED_SYRINGE | INTRAMUSCULAR | Status: DC
  Administered 2023-02-25 – 2023-03-01 (×5): 40 mg via SUBCUTANEOUS
  Filled 2023-02-25 (×5): qty 0.4

## 2023-02-25 MED ORDER — SODIUM CHLORIDE 0.9 % IV SOLN
1.0000 g | INTRAVENOUS | Status: DC
  Administered 2023-02-26 – 2023-02-27 (×2): 1 g via INTRAVENOUS
  Filled 2023-02-25 (×2): qty 10

## 2023-02-25 MED ORDER — HYDRALAZINE HCL 20 MG/ML IJ SOLN: 10.0000 mg | INTRAMUSCULAR | Status: DC | PRN

## 2023-02-25 NOTE — ED Notes (Addendum)
PT back from xray

## 2023-02-25 NOTE — ED Triage Notes (Signed)
Patient with history of diabetes and DKA sent to ED for evaluation of hyperglycemia. Patient denies pain, is alert, oriented, and in no apparent distress at this time.

## 2023-02-25 NOTE — ED Notes (Signed)
Pt well appearing upon transfer to floor. 

## 2023-02-25 NOTE — ED Notes (Addendum)
Pt not sharing liberally information.

## 2023-02-25 NOTE — ED Notes (Signed)
ED TO INPATIENT HANDOFF REPORT  ED Nurse Name and Phone #: Osvaldo ShipperCassaundra RN [phone]  S Name/Age/Gender [name] 72 y.o. female Room/Bed: 001C/001C  Code Status   Code Status: Full Code  Home/SNF/Other Home Patient oriented to: self, place, time, and situation Is this baseline? Yes   Triage Complete: Triage complete  Chief Complaint Hyperglycemia [R73.9]  Triage Note Patient with history of diabetes and DKA sent to ED for evaluation of hyperglycemia. Patient denies pain, is alert, oriented, and in no apparent distress at this time.   Allergies Allergies  Allergen Reactions   Penicillins Rash and Hives    Did it involve swelling of the face/tongue/throat, SOB, or low BP? Unknown Did it involve sudden or severe rash/hives, skin peeling, or any reaction on the inside of your mouth or nose? Unknown Did you need to seek medical attention at a hospital or doctor's office? Unknown When did it last happen?       If all above answers are "NO", may proceed with cephalosporin use.  Did it involve swelling of the face/tongue/throat, SOB, or low BP? Unknown Did it involve sudden or severe rash/hives, skin peeling, or any reaction on the inside of your mouth or nose? Unknown Did you need to seek medical attention at a hospital or doctor's office? Unknown When did it last happen?       If all above answers are "NO", may proceed with cephalosporin use.    Level of Care/Admitting Diagnosis ED Disposition     ED Disposition  Admit   Condition  --   Comment  Hospital Area: [facility] [[id number]]  Level of Care: Med-Surg [16]  [facility] or [facility] if equivalent level of care is available:: No  Covid Evaluation: Asymptomatic - no recent exposure (last 10 days) testing not required  Diagnosis: Hyperglycemia [454098][[id number]]  Admitting Physician: Jerald KiefHIU, STEPHEN K [[id number]]  Attending Physician: Jerald KiefHIU, STEPHEN K [phone][[id number]]  Certification:: I certify  there are rare and unusual circumstances requiring inpatient admission  Estimated Length of Stay: 2          B Medical/Surgery History Past Medical History:  Diagnosis Date   Allergy    Depression    Diabetes mellitus without complication (HCC)    Hyperlipidemia    Hypertension    Mixed stress and urge urinary incontinence    Osteoporosis    Pneumonia    Schizophrenia (HCC)    Stroke (HCC)    No past surgical history on file.   A IV Location/Drains/Wounds Patient Lines/Drains/Airways Status     Active Line/Drains/Airways     Name Placement date Placement time Site Days   Peripheral IV 02/25/23 20 G Right Antecubital 02/25/23  1504  Antecubital  less than 1   External Urinary Catheter 02/14/23  0000  --  11   Wound / Incision (Open or Dehisced) 02/13/23 Groin Mid 02/13/23  1345  Groin  12            Intake/Output Last 24 hours  Intake/Output Summary (Last 24 hours) at 02/25/2023 1712 Last data filed at 02/25/2023 1712 Gross per 24 hour  Intake 599.5 ml  Output --  Net 599.5 ml    Labs/Imaging Results for orders placed or performed during the hospital encounter of 02/25/23 (from the past 48 hour(s))  CBG monitoring, ED     Status: Abnormal   Collection Time: 02/25/23  1:06 PM  Result Value Ref Range   Glucose-Capillary 420 (H)  70 - 99 mg/dL    Comment: Glucose reference range applies only to samples taken after fasting for at least 8 hours.  CBC     Status: Abnormal   Collection Time: 02/25/23  1:11 PM  Result Value Ref Range   WBC 5.5 4.0 - 10.5 K/uL   RBC 4.49 3.87 - 5.11 MIL/uL   Hemoglobin 11.8 (L) 12.0 - 15.0 g/dL   HCT 56.3 89.3 - 73.4 %   MCV 81.7 80.0 - 100.0 fL   MCH 26.3 26.0 - 34.0 pg   MCHC 32.2 30.0 - 36.0 g/dL   RDW 28.7 (H) 68.1 - 15.7 %   Platelets 158 150 - 400 K/uL   nRBC 0.0 0.0 - 0.2 %    Comment: Performed at [facility], [address], [place], [place] [address]  Urinalysis, Routine w reflex microscopic -Urine, Clean  Catch     Status: Abnormal   Collection Time: 02/25/23  1:11 PM  Result Value Ref Range   Color, Urine YELLOW YELLOW   APPearance CLOUDY (A) CLEAR   Specific Gravity, Urine 1.023 1.005 - 1.030   pH 5.0 5.0 - 8.0   Glucose, UA >=500 (A) NEGATIVE mg/dL   Hgb urine dipstick SMALL (A) NEGATIVE   Bilirubin Urine NEGATIVE NEGATIVE   Ketones, ur 20 (A) NEGATIVE mg/dL   Protein, ur 559 (A) NEGATIVE mg/dL   Nitrite NEGATIVE NEGATIVE   Leukocytes,Ua LARGE (A) NEGATIVE   RBC / HPF 21-50 0 - 5 RBC/hpf   WBC, UA 11-20 0 - 5 WBC/hpf   Bacteria, UA RARE (A) NONE SEEN   Squamous Epithelial / HPF 6-10 0 - 5 /HPF   Mucus PRESENT    Budding Yeast PRESENT    Hyaline Casts, UA PRESENT     Comment: Performed at [facility], [address] [place], [place] [address]  Comprehensive metabolic panel     Status: Abnormal   Collection Time: 02/25/23  1:11 PM  Result Value Ref Range   Sodium 127 (L) 135 - 145 mmol/L   Potassium 4.6 3.5 - 5.1 mmol/L   Chloride 98 98 - 111 mmol/L   CO2 18 (L) 22 - 32 mmol/L   Glucose, Bld 452 (H) 70 - 99 mg/dL    Comment: Glucose reference range applies only to samples taken after fasting for at least 8 hours.   BUN 32 (H) 8 - 23 mg/dL   Creatinine, Ser 8.45 (H) 0.44 - 1.00 mg/dL   Calcium 9.6 8.9 - 36.4 mg/dL   Total Protein 6.9 6.5 - 8.1 g/dL   Albumin 3.3 (L) 3.5 - 5.0 g/dL   AST 18 15 - 41 U/L   ALT 13 0 - 44 U/L   Alkaline Phosphatase 130 (H) 38 - 126 U/L   Total Bilirubin 1.2 0.3 - 1.2 mg/dL   GFR, Estimated 43 (L) >60 mL/min    Comment: (NOTE) Calculated using the CKD-EPI Creatinine Equation (2021)    Anion gap 11 5 - 15    Comment: Performed at [facility], [address] [place], [place] [address]  I-Stat venous blood gas, [facility], [facility], [facility])     Status: Abnormal   Collection Time: 02/25/23  3:48 PM  Result Value Ref Range   pH, Ven 7.327 7.25 - 7.43   pCO2, Ven 34.0 (L) 44 - 60 mmHg   pO2, Ven 144 (H) 32 - 45 mmHg   Bicarbonate 17.8 (L)  20.0 - 28.0 mmol/L  TCO2 19 (L) 22 - 32 mmol/L   O2 Saturation 99 %   Acid-base deficit 7.0 (H) 0.0 - 2.0 mmol/L   Sodium 127 (L) 135 - 145 mmol/L   Potassium 4.6 3.5 - 5.1 mmol/L   Calcium, Ion 1.25 1.15 - 1.40 mmol/L   HCT 35.0 (L) 36.0 - 46.0 %   Hemoglobin 11.9 (L) 12.0 - 15.0 g/dL   Sample type VENOUS   CBG monitoring, ED     Status: Abnormal   Collection Time: 02/25/23  4:23 PM  Result Value Ref Range   Glucose-Capillary 283 (H) 70 - 99 mg/dL    Comment: Glucose reference range applies only to samples taken after fasting for at least 8 hours.   DG Chest 2 View  Result Date: 02/25/2023 CLINICAL DATA:  Cough, diminished lung sounds. EXAM: CHEST - 2 VIEW COMPARISON:  02/12/2023. FINDINGS: Patient is rotated. Trachea is midline. Heart size stable. Thoracic aorta is calcified. Increased streaky densities in the right upper lobe. Mild streaky scarring in the lung bases. No pleural fluid. IMPRESSION: Slight increase in streaky densities in the right upper lobe may represent infectious bronchiolitis. Electronically Signed   By: Leanna Battles M.D.   On: 02/25/2023 15:47    Pending Labs Unresulted Labs (From admission, onward)     Start     Ordered   03/04/23 0500  Creatinine, serum  (enoxaparin (LOVENOX)    CrCl >/= 30 ml/min)  Weekly,   R     Comments: while on enoxaparin therapy    02/25/23 1708   02/26/23 0500  Comprehensive metabolic panel  Tomorrow morning,   R        02/25/23 1708   02/26/23 0500  CBC  Tomorrow morning,   R        02/25/23 1708   02/25/23 1708  CBC  (enoxaparin (LOVENOX)    CrCl >/= 30 ml/min)  Once,   R       Comments: Baseline for enoxaparin therapy IF NOT ALREADY DRAWN.  Notify MD if PLT < 100 K.    02/25/23 1708   02/25/23 1708  Creatinine, serum  (enoxaparin (LOVENOX)    CrCl >/= 30 ml/min)  Once,   R       Comments: Baseline for enoxaparin therapy IF NOT ALREADY DRAWN.    02/25/23 1708   02/25/23 1623  SARS Coronavirus 2 by RT PCR (hospital order,  performed in [facility] hospital lab) *cepheid single result test* Anterior Nasal Swab  (SARS Coronavirus 2 by RT PCR (hospital order, performed in [facility] hospital lab) *cepheid single result test*)  Once,   URGENT        02/25/23 1622            Vitals/Pain Today's Vitals   02/25/23 1308 02/25/23 1310 02/25/23 1656  BP: 128/61    Pulse: 81    Resp: 17    Temp: (!) 97.4 F (36.3 C)  98.1 F (36.7 C)  TempSrc: Oral  Oral  SpO2: 98%    PainSc:  0-No pain     Isolation Precautions Airborne and Contact precautions  Medications Medications  lactated ringers infusion ( Intravenous New Bag/Given 02/25/23 1650)  cefTRIAXone (ROCEPHIN) 1 g in sodium chloride 0.9 % 100 mL IVPB (has no administration in time range)  insulin detemir (LEVEMIR) injection 35 Units (has no administration in time range)  insulin aspart (novoLOG) injection 0-15 Units (has no administration in time range)  insulin aspart (novoLOG) injection 0-5 Units (  has no administration in time range)  enoxaparin (LOVENOX) injection 40 mg (has no administration in time range)  acetaminophen (TYLENOL) tablet 650 mg (has no administration in time range)    Or  acetaminophen (TYLENOL) suppository 650 mg (has no administration in time range)  sodium chloride 0.9 % bolus 500 mL (0 mLs Intravenous Stopped 02/25/23 1603)  insulin aspart (novoLOG) injection 10 Units (10 Units Intravenous Given 02/25/23 1509)  cefTRIAXone (ROCEPHIN) 1 g in sodium chloride 0.9 % 100 mL IVPB (0 g Intravenous Stopped 02/25/23 1712)    Mobility walks     Focused Assessments Pulmonary Assessment Handoff:  Lung sounds: Bilateral Breath Sounds: Diminished, Coarse crackles O2 Device: Room Air      R Recommendations: See Admitting Provider Note  Report given to:   Additional Notes:

## 2023-02-25 NOTE — ED Provider Notes (Signed)
Emergency Department Provider Note   I have reviewed the triage vital signs and the nursing notes.   HISTORY  Chief Complaint Hyperglycemia   HPI [name] is a 72 y.o. female with PMH of DM, HLD, HTN, CVA, and schizophrenia presents to the emergency department from her PCP office [facility]) for hyperglycemia.  She states she has been out of her insulin for the past week and so has not been taking it.  When she went for her appointment today she had blood sugar in the 500 range and seemed less alert to staff.  They advise she present to the ED for further evaluation and rule out of DKA.  Patient tells me she lives with her boyfriend and does not have any issue affording or obtaining her medicines but simply ran out.  Denies any fevers.  No abdominal pain or vomiting.  No shortness of breath. Denies EtOH or drugs.    Past Medical History:  Diagnosis Date   Allergy    Depression    Diabetes mellitus without complication (HCC)    Hyperlipidemia    Hypertension    Mixed stress and urge urinary incontinence    Osteoporosis    Pneumonia    Schizophrenia (HCC)    Stroke (HCC)     Review of Systems  Constitutional: No fever/chills Cardiovascular: Denies chest pain. Respiratory: Denies shortness of breath. Positive cough.  Gastrointestinal: No abdominal pain.  No nausea, no vomiting.  No diarrhea.  No constipation. Genitourinary: Negative for dysuria. Musculoskeletal: Negative for back pain. Skin: Negative for rash. Neurological: Negative for headaches, focal weakness or numbness.   ____________________________________________   PHYSICAL EXAM:  VITAL SIGNS: ED Triage Vitals  Enc Vitals Group     BP 02/25/23 1308 128/61     Pulse Rate 02/25/23 1308 81     Resp 02/25/23 1308 17     Temp 02/25/23 1308 (!) 97.4 F (36.3 C)     Temp Source 02/25/23 1308 Oral     SpO2 02/25/23 1308 98 %   Constitutional: Alert with some mild confusion.  Somewhat disheveled and smells of urine.   Eyes: Conjunctivae are normal.  Head: Atraumatic. Nose: No congestion/rhinnorhea. Mouth/Throat: Mucous membranes are slightly dry.  Neck: No stridor.  Cardiovascular: Normal rate, regular rhythm. Good peripheral circulation. Grossly normal heart sounds.   Respiratory: Normal respiratory effort.  No retractions. Lungs CTAB. Gastrointestinal: Soft and nontender. No distention.  Musculoskeletal: No lower extremity tenderness nor edema. No gross deformities of extremities. Neurologic:  Normal speech and language. Skin:  Skin is warm, dry and intact. No rash noted.  ____________________________________________   LABS (all labs ordered are listed, but only abnormal results are displayed)  Labs Reviewed  CBC - Abnormal; Notable for the following components:      Result Value   Hemoglobin 11.8 (*)    RDW 16.9 (*)    All other components within normal limits  URINALYSIS, ROUTINE W REFLEX MICROSCOPIC - Abnormal; Notable for the following components:   APPearance CLOUDY (*)    Glucose, UA >=500 (*)    Hgb urine dipstick SMALL (*)    Ketones, ur 20 (*)    Protein, ur 100 (*)    Leukocytes,Ua LARGE (*)    Bacteria, UA RARE (*)    All other components within normal limits  COMPREHENSIVE METABOLIC PANEL - Abnormal; Notable for the following components:   Sodium 127 (*)    CO2 18 (*)    Glucose, Bld 452 (*)  BUN 32 (*)    Creatinine, Ser 1.34 (*)    Albumin 3.3 (*)    Alkaline Phosphatase 130 (*)    GFR, Estimated 43 (*)    All other components within normal limits  CBG MONITORING, ED - Abnormal; Notable for the following components:   Glucose-Capillary 420 (*)    All other components within normal limits  CBG MONITORING, ED  I-STAT VENOUS BLOOD GAS, ED   ____________________________________________  RADIOLOGY  No results found.  ____________________________________________   PROCEDURES  Procedure(s) performed:    Procedures   ____________________________________________   INITIAL IMPRESSION / ASSESSMENT AND PLAN / ED COURSE  Pertinent labs & imaging results that were available during my care of the patient were reviewed by me and considered in my medical decision making (see chart for details).   This patient is Presenting for Evaluation of AMS, which does require a range of treatment options, and is a complaint that involves a high risk of morbidity and mortality.  The Differential Diagnoses includes but is not exclusive to alcohol, illicit or prescription medications, intracranial pathology such as stroke, intracerebral hemorrhage, fever or infectious causes including sepsis, hypoxemia, uremia, trauma, endocrine related disorders such as diabetes, hypoglycemia, thyroid-related diseases, etc.   Critical Interventions-    Medications  sodium chloride 0.9 % bolus 500 mL (has no administration in time range)  insulin aspart (novoLOG) injection 10 Units (has no administration in time range)    Reassessment after intervention:    I decided to review pertinent External Data, and in summary patient with paper note from PCP office describing somnolence and hyperglycemia. Has both Fread Kottke-acting and short acting insulin on paperwork and in Epic.    Clinical Laboratory Tests Ordered, included patient with some mild acute kidney injury with creatinine to 1.3 and GFR 43 from baseline of 0.5.  UA shows glucose along with 20 of ketones.  Large leukocytes and some rare bacteria.  Pseudohyponatremia noted.  Radiologic Tests Ordered, included CXR. I independently interpreted the images and agree with radiology interpretation.   Cardiac Monitor Tracing which shows NSR.    Social Determinants of Health Risk denies EtOH or drug use.   Consult complete with  Medical Decision Making: Summary:  Patient presents the emergency department with cough, hyperglycemia, somnolence at her PCP office.  Alert here.   Somewhat disheveled.  Smelling of urine.  No fever or SIRS vitals.  Abdomen soft.  Mild confusion here.  Does appear dehydrated UA with rare bacteria possible infection but no clear symptoms.  She does have a deep cough on my exam.  Plan for chest x-ray to evaluate for pneumonia.  She does not appear to be in DKA by chemistry but will add on a VBG and start short acting insulin along with IV fluids.  Reevaluation with update and discussion with   ***Considered admission***  Patient's presentation is most consistent with acute presentation with potential threat to life or bodily function.   Disposition:   ____________________________________________  FINAL CLINICAL IMPRESSION(S) / ED DIAGNOSES  Final diagnoses:  None     NEW OUTPATIENT MEDICATIONS STARTED DURING THIS VISIT:  New Prescriptions   No medications on file    Note:  This document was prepared using Dragon voice recognition software and may include unintentional dictation errors.  Alona Bene, MD, [facility] Emergency Medicine

## 2023-02-25 NOTE — Progress Notes (Signed)
Pt arrived on the unit from the ED completley soaked in urine and no report was given from ED RN.

## 2023-02-25 NOTE — H&P (Signed)
History and Physical    Patient: Abigail RutherfordDeborah A Pitz ZOX:[id number] DOB: 1951/11/13 DOA: 02/25/2023 DOS: the patient was seen and examined on 02/25/2023 PCP: Ardith DarkParker, Caleb M, MD  Patient coming from: Home  Chief Complaint:  Chief Complaint  Patient presents with   Hyperglycemia   HPI: [name] is a 72 y.o. female with medical history significant of DM2 with prior admits for DKA, HTN, depression, schizophrenia who was recently admitted for DKA and discharged. Pt reportedly had been without insulin over the past week. Went to PCP where pt was found to have glucose in excess of 500. Pt was then referred to ED for further work up  In the ED, pt was found to have UA suggestive of UTI. Started on rocephin. Glucose noted to be in excess of 462. Pt was given 10 units of IV insulin with glucose down to 283. Na noted to be 127. Cr elevated at 1.34. Given concerns of confusion, dehydration, hyperglycemia, UTI, hospitalist consulted for consideration for admission.  Review of Systems: As mentioned in the history of present illness. All other systems reviewed and are negative. Past Medical History:  Diagnosis Date   Allergy    Depression    Diabetes mellitus without complication (HCC)    Hyperlipidemia    Hypertension    Mixed stress and urge urinary incontinence    Osteoporosis    Pneumonia    Schizophrenia (HCC)    Stroke (HCC)    No past surgical history on file. Social History:  reports that she has quit smoking. Her smoking use included cigarettes. She smoked an average of 3 packs per day. She has never used smokeless tobacco. She reports current alcohol use of about 6.0 standard drinks of alcohol per week. She reports that she does not use drugs.  Allergies  Allergen Reactions   Penicillins Rash and Hives    Did it involve swelling of the face/tongue/throat, SOB, or low BP? Unknown Did it involve sudden or severe rash/hives, skin peeling, or any reaction on the inside of your  mouth or nose? Unknown Did you need to seek medical attention at a hospital or doctor's office? Unknown When did it last happen?       If all above answers are "NO", may proceed with cephalosporin use.  Did it involve swelling of the face/tongue/throat, SOB, or low BP? Unknown Did it involve sudden or severe rash/hives, skin peeling, or any reaction on the inside of your mouth or nose? Unknown Did you need to seek medical attention at a hospital or doctor's office? Unknown When did it last happen?       If all above answers are "NO", may proceed with cephalosporin use.    Family History  Problem Relation Age of Onset   Heart disease Mother    Hypertension Father    Stroke Father    Heart disease Father    Diabetes Father    Emphysema Paternal Grandfather     Prior to Admission medications   Medication Sig Start Date End Date Taking? Authorizing Provider  albuterol (VENTOLIN HFA) 108 (90 Base) MCG/ACT inhaler TAKE 2 PUFFS BY MOUTH EVERY 6 HOURS AS NEEDED FOR WHEEZE OR SHORTNESS OF BREATH Patient taking differently: Inhale 1 puff into the lungs every 6 (six) hours as needed for shortness of breath. 12/03/22   Ardith DarkParker, Caleb M, MD  ARIPiprazole (ABILIFY) 30 MG tablet Take 30 mg by mouth at bedtime. 11/10/20   [provider]  aspirin 81 MG EC tablet  Take 81 mg by mouth at bedtime.    [provider]  atorvastatin (LIPITOR) 20 MG tablet Take 20 mg by mouth daily. 11/09/19   [provider]  benztropine (COGENTIN) 1 MG tablet Take 1 mg by mouth at bedtime.     [provider]  buPROPion (WELLBUTRIN) 75 MG tablet Take 75 mg by mouth 2 (two) times daily.    [provider]  Cholecalciferol 50 MCG (2000 UT) CAPS Take 1 capsule by mouth daily.    [provider]  clotrimazole (LOTRIMIN) 1 % cream Apply to affected area 2 times daily Patient not taking: Reported on 02/13/2023 11/26/22   Vanetta Mulders, MD  clotrimazole-betamethasone  (LOTRISONE) cream Apply 1 Application topically daily. Patient not taking: Reported on 02/13/2023 06/21/22   Janeece Agee, NP  Continuous Blood Gluc Receiver (DEXCOM G6 RECEIVER) DEVI Use as directed for continuous glucose monitoring. 05/09/21   [provider]  Continuous Blood Gluc Sensor (FREESTYLE LIBRE 2 SENSOR) MISC 1 each by Does not apply route every 14 (fourteen) days. 01/14/23   Ardith Dark, MD  diclofenac (VOLTAREN) 75 MG EC tablet Take 1 tablet (75 mg total) by mouth 2 (two) times daily. 02/07/23   Ardith Dark, MD  haloperidol (HALDOL) 5 MG tablet Take 5 mg by mouth daily.    [provider]  hydrOXYzine (ATARAX) 25 MG tablet Take 25-50 mg by mouth at bedtime as needed for itching. 03/23/22   [provider]  insulin aspart (FIASP FLEXTOUCH) 100 UNIT/ML FlexTouch Pen Inject 8 Units into the skin 3 (three) times daily before meals. 02/14/23   Leatha Gilding, MD  insulin degludec (TRESIBA FLEXTOUCH) 100 UNIT/ML FlexTouch Pen Inject 38 Units into the skin daily. 02/14/23   Leatha Gilding, MD  metoprolol succinate (TOPROL-XL) 25 MG 24 hr tablet Take 0.5 tablets (12.5 mg total) by mouth daily. Patient taking differently: Take 25 mg by mouth daily. 01/09/22   Janeece Agee, NP  mirtazapine (REMERON) 30 MG tablet Take 30 mg by mouth at bedtime.     [provider]  OYSCO 500 500 MG TABS Take 1 tablet (1,250 mg total) by mouth daily. 01/03/22   Janeece Agee, NP  potassium chloride (KLOR-CON) 10 MEQ tablet Take 2 tablets (20 mEq total) by mouth daily for 15 days. 11/30/20 08/16/22  Vassie Loll, MD    Physical Exam: Vitals:   02/25/23 1308  BP: 128/61  Pulse: 81  Resp: 17  Temp: (!) 97.4 F (36.3 C)  TempSrc: Oral  SpO2: 98%   General exam: Awake, laying in bed, in nad Respiratory system: Normal respiratory effort, no wheezing Cardiovascular system: regular rate, s1, s2 Gastrointestinal system: Soft, nondistended, positive BS Central  nervous system: CN2-12 grossly intact, strength intact Extremities: Perfused, no clubbing Skin: Normal skin turgor, no notable skin lesions seen Psychiatry: Mood normal // no visual hallucinations   Data Reviewed:  Labs reviewed: Na 127, K 4.6, Cr 1.34, WBC 5.5, Hgb 11.9, Plts 158  Assessment and Plan: DM2 with Hyperglycmia without DKA -glucose down to mid-200's after 10 units IV insulin -Normal anion gap -Will resume long-acting insulin with SSI coverage -Consult diabetic coordinator -Adjust insulin as needed  Hyponatremia -Suspect secondary to elevated glucose -Cont IVF, cont insulin  HTN -bp stable  UTI -UA suggestive of UTI -Cont rocehpin -follow cultures  Toxic metabolic encephalopathy -continue rocephin -Cont IVF  Dehydration -cont IVF  ARF -cont ivf   Advance Care Planning:   Code Status: Prior  Full  Consults:   Family Communication: [name]  Severity of Illness: The appropriate patient status for this patient is INPATIENT. Inpatient status is judged to be reasonable and necessary in order to provide the required intensity of service to ensure the patient's safety. The patient's presenting symptoms, physical exam findings, and initial radiographic and laboratory data in the context of their chronic comorbidities is felt to place them at high risk for further clinical deterioration. Furthermore, it is not anticipated that the patient will be medically stable for discharge from the hospital within 2 midnights of admission.   * I certify that at the point of admission it is my clinical judgment that the patient will require inpatient hospital care spanning beyond 2 midnights from the point of admission due to high intensity of service, high risk for further deterioration and high frequency of surveillance required.*  Author: Rickey Barbara, MD 02/25/2023 4:33 PM  For on call review [email or web address].

## 2023-02-26 ENCOUNTER — Encounter (HOSPITAL_COMMUNITY): Payer: Self-pay | Admitting: Internal Medicine

## 2023-02-26 DIAGNOSIS — N3000 Acute cystitis without hematuria: Secondary | ICD-10-CM | POA: Diagnosis not present

## 2023-02-26 DIAGNOSIS — N179 Acute kidney failure, unspecified: Secondary | ICD-10-CM | POA: Diagnosis not present

## 2023-02-26 DIAGNOSIS — R739 Hyperglycemia, unspecified: Secondary | ICD-10-CM | POA: Diagnosis not present

## 2023-02-26 LAB — COMPREHENSIVE METABOLIC PANEL WITH GFR
ALT: 13 U/L (ref 0–44)
AST: 10 U/L — ABNORMAL LOW (ref 15–41)
Albumin: 2.9 g/dL — ABNORMAL LOW (ref 3.5–5.0)
Alkaline Phosphatase: 118 U/L (ref 38–126)
Anion gap: 12 (ref 5–15)
BUN: 24 mg/dL — ABNORMAL HIGH (ref 8–23)
CO2: 19 mmol/L — ABNORMAL LOW (ref 22–32)
Calcium: 9.2 mg/dL (ref 8.9–10.3)
Chloride: 98 mmol/L (ref 98–111)
Creatinine, Ser: 0.95 mg/dL (ref 0.44–1.00)
GFR, Estimated: >60 mL/min
Glucose, Bld: 324 mg/dL — ABNORMAL HIGH (ref 70–99)
Potassium: 3.8 mmol/L (ref 3.5–5.1)
Sodium: 129 mmol/L — ABNORMAL LOW (ref 135–145)
Total Bilirubin: 0.9 mg/dL (ref 0.3–1.2)
Total Protein: 6.7 g/dL (ref 6.5–8.1)

## 2023-02-26 LAB — GLUCOSE, CAPILLARY
Glucose-Capillary: 376 mg/dL — ABNORMAL HIGH (ref 70–99)
Glucose-Capillary: 295 mg/dL — ABNORMAL HIGH (ref 70–99)
Glucose-Capillary: 296 mg/dL — ABNORMAL HIGH (ref 70–99)
Glucose-Capillary: 352 mg/dL — ABNORMAL HIGH (ref 70–99)
Glucose-Capillary: 166 mg/dL — ABNORMAL HIGH (ref 70–99)
Glucose-Capillary: 249 mg/dL — ABNORMAL HIGH (ref 70–99)
Glucose-Capillary: 271 mg/dL — ABNORMAL HIGH (ref 70–99)

## 2023-02-26 LAB — CBC
HCT: 34.9 % — ABNORMAL LOW (ref 36.0–46.0)
Hemoglobin: 11.6 g/dL — ABNORMAL LOW (ref 12.0–15.0)
MCH: 26.5 pg (ref 26.0–34.0)
MCHC: 33.2 g/dL (ref 30.0–36.0)
MCV: 79.7 fL — ABNORMAL LOW (ref 80.0–100.0)
Platelets: 144 K/uL — ABNORMAL LOW (ref 150–400)
RBC: 4.38 MIL/uL (ref 3.87–5.11)
RDW: 16.7 % — ABNORMAL HIGH (ref 11.5–15.5)
WBC: 4.1 K/uL (ref 4.0–10.5)
nRBC: 0 % (ref 0.0–0.2)

## 2023-02-26 MED ORDER — METOPROLOL SUCCINATE ER 25 MG PO TB24
12.5000 mg | ORAL_TABLET | Freq: Every day | ORAL | Status: DC
  Administered 2023-02-26 – 2023-03-02 (×5): 12.5 mg via ORAL
  Filled 2023-02-26 (×5): qty 1

## 2023-02-26 MED ORDER — ALBUTEROL SULFATE HFA 108 (90 BASE) MCG/ACT IN AERS: 2.0000 | INHALATION_SPRAY | RESPIRATORY_TRACT | Status: DC | PRN

## 2023-02-26 MED ORDER — INSULIN ASPART 100 UNIT/ML IJ SOLN
0.0000 [IU] | Freq: Three times a day (TID) | INTRAMUSCULAR | Status: DC
  Administered 2023-02-26 – 2023-02-27 (×3): 15 [IU] via SUBCUTANEOUS
  Administered 2023-02-28: 2 [IU] via SUBCUTANEOUS
  Administered 2023-02-28: 5 [IU] via SUBCUTANEOUS
  Administered 2023-03-01: 8 [IU] via SUBCUTANEOUS

## 2023-02-26 MED ORDER — HALOPERIDOL 1 MG PO TABS
1.0000 mg | ORAL_TABLET | Freq: Every day | ORAL | Status: DC
  Administered 2023-02-27 – 2023-03-02 (×4): 1 mg via ORAL
  Filled 2023-02-26 (×4): qty 1

## 2023-02-26 MED ORDER — HALOPERIDOL 5 MG PO TABS
5.0000 mg | ORAL_TABLET | Freq: Every day | ORAL | Status: DC
  Administered 2023-02-26: 5 mg via ORAL
  Filled 2023-02-26: qty 1

## 2023-02-26 MED ORDER — INSULIN ASPART 100 UNIT/ML IJ SOLN
5.0000 [IU] | Freq: Three times a day (TID) | INTRAMUSCULAR | Status: DC
  Administered 2023-02-26 – 2023-02-27 (×2): 5 [IU] via SUBCUTANEOUS

## 2023-02-26 MED ORDER — INSULIN DETEMIR 100 UNIT/ML ~~LOC~~ SOLN
40.0000 [IU] | Freq: Every day | SUBCUTANEOUS | Status: DC
  Administered 2023-02-26 – 2023-02-27 (×2): 40 [IU] via SUBCUTANEOUS
  Filled 2023-02-26 (×2): qty 0.4

## 2023-02-26 MED ORDER — BENZTROPINE MESYLATE 0.5 MG PO TABS
1.0000 mg | ORAL_TABLET | Freq: Every day | ORAL | Status: DC
  Administered 2023-02-26 – 2023-03-01 (×4): 1 mg via ORAL
  Filled 2023-02-26 (×4): qty 2

## 2023-02-26 MED ORDER — ALBUTEROL SULFATE (2.5 MG/3ML) 0.083% IN NEBU: 2.5000 mg | INHALATION_SOLUTION | RESPIRATORY_TRACT | Status: DC | PRN

## 2023-02-26 MED ORDER — ARIPIPRAZOLE 10 MG PO TABS
30.0000 mg | ORAL_TABLET | Freq: Every day | ORAL | Status: DC
  Administered 2023-02-26 – 2023-03-01 (×4): 30 mg via ORAL
  Filled 2023-02-26 (×4): qty 3

## 2023-02-26 MED ORDER — MIRTAZAPINE 15 MG PO TABS: 30.0000 mg | ORAL_TABLET | Freq: Every day | ORAL | Status: DC

## 2023-02-26 NOTE — Progress Notes (Signed)
  Progress Note   Patient: [name] DHR:[id number] DOB: Jan 25, 1951 DOA: 02/25/2023     1 DOS: the patient was seen and examined on 02/26/2023   Brief hospital course: 72 y.o. female with medical history significant of DM2 with prior admits for DKA, HTN, depression, schizophrenia who was recently admitted for DKA and discharged. Pt reportedly had been without insulin over the past week. Went to PCP where pt was found to have glucose in excess of 500. Pt was then referred to ED for further work up   Assessment and Plan: DM2 with Hyperglycmia without DKA -Markedly elevated glucose noted on admission secondary to gross medical noncompliance -Insulin resumed and glucose has improved -Pt reportedly stated to RN that she takes whatever random dose of insulin she thinks she needs at that specific time   Hyponatremia -Suspect secondary to elevated glucose -Improved with IVF and better glycemic control   HTN -bp stable -cont metoprolol   UTI -UA suggestive of UTI -Cont rocehpin -Called laboratory to request adding urine cultures taken from urine sample at admission. Micro stated they will be run. Pending -recheck cbc in AM   Toxic metabolic encephalopathy -continue rocephin -Cont IVF   Dehydration -Improved with IVF -recheck bmet in AM   ARF -cont ivf  Schizophrenia -Per pharmacy, psych meds listed on recent d/c summary were never filled since she was last discharged -Overnight, pt was noted to be confused and agitated -Would resume psych meds as listed on 3/30 d/c summary.      Subjective: Without complaints this AM  Physical Exam: Vitals:   02/26/23 0009 02/26/23 0454 02/26/23 0901 02/26/23 1636  BP: (!) 150/58 (!) 141/67 (!) 164/68 121/66  Pulse: 85 82 87 71  Resp: 17 17 16 16   Temp: 97.9 F (36.6 C) 98.4 F (36.9 C) 98.2 F (36.8 C) 98.6 F (37 C)  TempSrc: Oral Oral Oral Oral  SpO2: 97% 100% 98% 95%  Weight:  63 kg     General exam: Awake, laying in  bed, in nad Respiratory system: Normal respiratory effort, no wheezing Cardiovascular system: regular rate, s1, s2 Gastrointestinal system: Soft, nondistended, positive BS Central nervous system: CN2-12 grossly intact, strength intact Extremities: Perfused, no clubbing Skin: Normal skin turgor, no notable skin lesions seen Psychiatry: Mood normal // no visual hallucinations   Data Reviewed:  Labs reviewed: na 129, K 3.8, Cr 0.95   Family Communication: [name], family not at bedside  Disposition: Status is: Inpatient Remains inpatient appropriate because: Severity of illness  Planned Discharge Destination: Home    Author: Rickey Barbara, MD 02/26/2023 6:27 PM  For on call review [email or web address].

## 2023-02-26 NOTE — TOC Initial Note (Addendum)
Transition of Care [facility]) - Initial/Assessment Note    Patient Details  Name: [name] MRN: [id number] Date of Birth: 06/01/1951  Transition of [facility]) CM/SW Contact:    Kingsley Plan, RN Phone Number: 02/26/2023, 3:00 PM  Clinical Narrative:                  Spoke to patient and significant other Kelby Fam at bedside.   Patient from home with Gerlene Burdock  PCP is Jacquiline Doe   Confirmed address. Patient and Gerlene Burdock have lost their phones. They currently have no phone.   Richard reported Okey Regal gets patient's pills ready for 2 weeks at a time and then he assists her with taking them. Richard gets patient's insulin ready and gives her "3 shots a day".   Per H and P patient was without insulin at home. Richard stated just one day . They have lots of insulin and supplies at home, they just forgot insulin one day.   Discussed HHRN / SW Richard in agreement , patient asleep. However, they have no phone. Okey Regal lives 10 miles away and could get messages to them.   NCM called Carol [phone] and left message. Await call back.   Per Richard family can provide transportation home   Okey Regal called NCM back. She is in agreement with [facility] and [facility]. She can be contact person for home health , however she really wants Richard to find his phone ASAP. Carol's husband is having hernia surgery soon and she will need to assist him. NCM discussed this with Gerlene Burdock , he believes his phone is in the house and he will look for it this evening.   Richard asking if on the day of discharge if first visit with Frances Furbish can be arranged prior to discharge. NCM will place in hand off, also told Richard to ask nurse to call [facility] team when patient being discharge.  Expected Discharge Plan: Home w Home Health Services Barriers to Discharge: Continued Medical Work up   Patient Goals and CMS Choice Patient states their goals for this hospitalization and ongoing recovery are:: to return to home CMS  Medicare.gov Compare Post Acute Care list provided to::  (left Okey Regal a message)   [place] ownership interest in [facility]provided to:: Patient    Expected Discharge Plan and Services   Discharge Planning Services: CM Consult Post Acute Care Choice: Home Health Living arrangements for the past 2 months: Single Family Home                 DME Arranged: N/A         HH Arranged: RN, Social Work          Prior Living Arrangements/Services Living arrangements for the past 2 months: Single Family Home Lives with:: Significant Other Patient language and need for interpreter reviewed:: Yes Do you feel safe going back to the place where you live?: Yes      Need for Family Participation in Patient Care: Yes (Comment) Care giver support system in place?: Yes (comment)   Criminal Activity/Legal Involvement Pertinent to Current Situation/Hospitalization: No - Comment as needed  Activities of Daily Living Home Assistive Devices/Equipment: CBG Meter, Dentures (specify type) ADL Screening (condition at time of admission) Patient's cognitive ability adequate to safely complete daily activities?: No Is the patient deaf or have difficulty hearing?: No Does the patient have difficulty seeing, even when wearing glasses/contacts?: No Does the patient have difficulty concentrating, remembering, or making decisions?:  Yes Patient able to express need for assistance with ADLs?: Yes Does the patient have difficulty dressing or bathing?: Yes Independently performs ADLs?: No Does the patient have difficulty walking or climbing stairs?: Yes Weakness of Legs: Both Weakness of Arms/Hands: None  Permission Sought/Granted   Permission granted to share information with : Yes, Verbal Permission Granted  Share Information with NAME: Kelby Fam and sister Woody Seller           Emotional Assessment Appearance:: Appears stated age     Orientation: : Oriented to Self, Oriented to  Place Alcohol / Substance Use: Not Applicable Psych Involvement: No (comment)  Admission diagnosis:  Encephalopathy [G93.40] Hyperglycemia [R73.9] Acute cystitis without hematuria [N30.00] AKI (acute kidney injury) [N17.9] Patient Active Problem List   Diagnosis Date Noted   Hyperglycemia 02/25/2023   DKA (diabetic ketoacidosis) 02/13/2023   Pseudohyponatremia 02/13/2023   Elevated beta-hydroxybutyrate 02/13/2023   Tachycardia 02/13/2023   Protein calorie malnutrition 02/13/2023   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 [place] [address] Phone: [phone] Fax: [phone]  [facility] - [place], [place] - [address] [place] [address] Phone: [phone] Fax: [phone]  [facility] [address] [place] [address] Phone: [phone] Fax: [phone]     Social Determinants of Health (SDOH) Social History: SDOH Screenings   Food Insecurity: No Food Insecurity (02/14/2023)  Housing: Low Risk  (02/14/2023)  Transportation Needs: No Transportation Needs (02/14/2023)  Utilities: Not At Risk (02/14/2023)  Depression (PHQ2-9): Low Risk  (02/07/2023)  Recent Concern: Depression (PHQ2-9) - Medium Risk (12/18/2022)  Tobacco Use: Medium Risk (02/26/2023)   SDOH Interventions:     Readmission Risk Interventions    11/30/2020   11:08 AM 11/29/2020   10:33 AM  Readmission Risk Prevention Plan  Transportation Screening  Complete  PCP or Specialist Appt within 5-7 Days Complete   PCP or Specialist Appt within  3-5 Days  Complete  Home Care Screening Complete   Medication Review (RN CM) Complete   HRI or Home Care Consult  Complete  Social Work Consult for Recovery Care Planning/Counseling  Complete  Palliative Care Screening  Not Applicable  Medication Review [employer])  Complete

## 2023-02-26 NOTE — Hospital Course (Signed)
72 y.o. female with medical history significant of DM2 with prior admits for DKA, HTN, depression, schizophrenia who was recently admitted for DKA and discharged. Pt reportedly had been without insulin over the past week. Went to PCP where pt was found to have glucose in excess of 500. Pt was then referred to ED for further work up

## 2023-02-26 NOTE — Inpatient Diabetes Management (Signed)
Inpatient Diabetes Program Recommendations  AACE/ADA: New Consensus Statement on Inpatient Glycemic Control (2015)  Target Ranges:  Prepandial:   less than 140 mg/dL      Peak postprandial:   less than 180 mg/dL (1-2 hours)      Critically ill patients:  140 - 180 mg/dL   Lab Results  Component Value Date   GLUCAP 352 (H) 02/26/2023   HGBA1C 14.1 (H) 02/13/2023    Review of Glycemic Control  Diabetes history: DM type 1 Outpatient Diabetes medications: Tresiba 38 units Daily, Fiasp 8 units tid Current orders for Inpatient glycemic control:  Levemir 40 units Daily Novolog 0-15 units tid + hs Novolog  5 units tid meal coverage  Pt recently in the hospital with DKA and UTI d/c'd on 3/26. Pt was provided with refills on medications, When asked about the medications pt did not seem to know about refills. Pt also reports memory issues with taking insulin consistently. Pt states she has plenty of insulin and blood glucose supplies at home. Pt has a fingerstick glucometer at home. A1c was 14.1% on 3/27. Reviewed glucose and A1c goals. Encouraged follow up with PCP. No needs identified based on conversation held with pt.   Thanks,  Christena Deem RN, MSN, BC-ADM Inpatient Diabetes Coordinator Team Pager [phone] (8a-5p)

## 2023-02-27 DIAGNOSIS — G9340 Encephalopathy, unspecified: Secondary | ICD-10-CM | POA: Diagnosis not present

## 2023-02-27 DIAGNOSIS — N3000 Acute cystitis without hematuria: Secondary | ICD-10-CM | POA: Diagnosis not present

## 2023-02-27 DIAGNOSIS — R739 Hyperglycemia, unspecified: Secondary | ICD-10-CM | POA: Diagnosis not present

## 2023-02-27 DIAGNOSIS — N179 Acute kidney failure, unspecified: Secondary | ICD-10-CM | POA: Diagnosis not present

## 2023-02-27 LAB — CBC
HCT: 33.4 % — ABNORMAL LOW (ref 36.0–46.0)
Hemoglobin: 11 g/dL — ABNORMAL LOW (ref 12.0–15.0)
MCH: 26.3 pg (ref 26.0–34.0)
MCHC: 32.9 g/dL (ref 30.0–36.0)
MCV: 79.9 fL — ABNORMAL LOW (ref 80.0–100.0)
Platelets: 111 10*3/uL — ABNORMAL LOW (ref 150–400)
RBC: 4.18 MIL/uL (ref 3.87–5.11)
RDW: 16.4 % — ABNORMAL HIGH (ref 11.5–15.5)
WBC: 4 10*3/uL (ref 4.0–10.5)
nRBC: 0 % (ref 0.0–0.2)

## 2023-02-27 LAB — COMPREHENSIVE METABOLIC PANEL
ALT: 10 U/L (ref 0–44)
AST: 13 U/L — ABNORMAL LOW (ref 15–41)
Albumin: 2.5 g/dL — ABNORMAL LOW (ref 3.5–5.0)
Alkaline Phosphatase: 99 U/L (ref 38–126)
Anion gap: 8 (ref 5–15)
BUN: 23 mg/dL (ref 8–23)
CO2: 19 mmol/L — ABNORMAL LOW (ref 22–32)
Calcium: 8.5 mg/dL — ABNORMAL LOW (ref 8.9–10.3)
Chloride: 104 mmol/L (ref 98–111)
Creatinine, Ser: 0.8 mg/dL (ref 0.44–1.00)
GFR, Estimated: >60 mL/min (ref >60)
Glucose, Bld: 317 mg/dL — ABNORMAL HIGH (ref 70–99)
Potassium: 3.9 mmol/L (ref 3.5–5.1)
Sodium: 131 mmol/L — ABNORMAL LOW (ref 135–145)
Total Bilirubin: 0.6 mg/dL (ref 0.3–1.2)
Total Protein: 5.7 g/dL — ABNORMAL LOW (ref 6.5–8.1)

## 2023-02-27 LAB — MAGNESIUM: Magnesium: 1.8 mg/dL (ref 1.7–2.4)

## 2023-02-27 LAB — GLUCOSE, CAPILLARY
Glucose-Capillary: 408 mg/dL — ABNORMAL HIGH (ref 70–99)
Glucose-Capillary: 397 mg/dL — ABNORMAL HIGH (ref 70–99)
Glucose-Capillary: 113 mg/dL — ABNORMAL HIGH (ref 70–99)
Glucose-Capillary: 51 mg/dL — ABNORMAL LOW (ref 70–99)
Glucose-Capillary: 55 mg/dL — ABNORMAL LOW (ref 70–99)
Glucose-Capillary: 62 mg/dL — ABNORMAL LOW (ref 70–99)
Glucose-Capillary: 102 mg/dL — ABNORMAL HIGH (ref 70–99)

## 2023-02-27 MED ORDER — INSULIN DETEMIR 100 UNIT/ML ~~LOC~~ SOLN
45.0000 [IU] | Freq: Every day | SUBCUTANEOUS | Status: DC
  Administered 2023-02-28: 45 [IU] via SUBCUTANEOUS
  Filled 2023-02-27: qty 0.45

## 2023-02-27 MED ORDER — INSULIN ASPART 100 UNIT/ML IJ SOLN
8.0000 [IU] | Freq: Three times a day (TID) | INTRAMUSCULAR | Status: DC
  Administered 2023-02-28: 8 [IU] via SUBCUTANEOUS

## 2023-02-27 MED ORDER — INSULIN DETEMIR 100 UNIT/ML ~~LOC~~ SOLN
5.0000 [IU] | Freq: Once | SUBCUTANEOUS | Status: AC
  Administered 2023-02-27: 5 [IU] via SUBCUTANEOUS
  Filled 2023-02-27: qty 0.05

## 2023-02-27 NOTE — Inpatient Diabetes Management (Signed)
Inpatient Diabetes Program Recommendations  AACE/ADA: New Consensus Statement on Inpatient Glycemic Control (2015)  Target Ranges:  Prepandial:   less than 140 mg/dL      Peak postprandial:   less than 180 mg/dL (1-2 hours)      Critically ill patients:  140 - 180 mg/dL   Lab Results  Component Value Date   GLUCAP 408 (H) 02/27/2023   HGBA1C 14.1 (H) 02/13/2023    Review of Glycemic Control  Latest Reference Range & Units 02/26/23 09:06 02/26/23 12:17 02/26/23 16:40 02/26/23 20:44 02/26/23 22:31 02/27/23 08:41  Glucose-Capillary 70 - 99 mg/dL 081 (H) 388 (H) 719 (H) 249 (H) 271 (H) 408 (H)   Diabetes history: DM type 1 Outpatient Diabetes medications: Tresiba 38 units Daily, Fiasp 8 units tid Current orders for Inpatient glycemic control:  Levemir 40 units Daily Novolog 0-15 units tid + hs Novolog  5 units tid meal coverage  Consider:  -   Increase Levemir to 45 units -   Increase Novolog meal coverage to 8 units tid if eating >50% of meals  Spoke with pt yesterday 4/9 regarding A1c level.  Thanks,  Christena Deem RN, MSN, BC-ADM Inpatient Diabetes Coordinator Team Pager [phone] (8a-5p)

## 2023-02-27 NOTE — Plan of Care (Signed)

## 2023-02-27 NOTE — Progress Notes (Signed)
[facility]  PROGRESS NOTE  [name] GYB:[id number] DOB: 06/02/1951 DOA: 02/25/2023 PCP: Ardith Dark, MD   Brief HPI:    72 y.o. female with medical history significant of DM2 with prior admits for DKA, HTN, depression, schizophrenia who was recently admitted for DKA and discharged. Pt reportedly had been without insulin over the past week. Went to PCP where pt was found to have glucose in excess of 500. Pt was then referred to ED for further work up    Subjective   Patient seen and examined, continues to have high blood glucose.   Assessment/Plan:    Diabetes mellitus type 2 with hyperglycemia -Markedly elevated blood glucose secondary to gross medical noncompliance -Insulin resumed, glucose improved -CBG still elevated, will change Levemir to 45 units subcu daily, increase meal coverage to 8 units subcu 3 times daily.  Hyponatremia -Mild, in setting of hyperglycemia -Slowly improving  UTI -UA was abnormal, with cloudy urine -Urine culture not obtained -Patient started on ceftriaxone -Will treat for 3 days and then stop  Toxic metabolic encephalopathy -In setting of hyperglycemia, UTI -Resolved  Acute kidney injury -Resolved with IV fluids   Schizophrenia -Per pharmacy, psych meds listed on recent d/c summary were never filled since she was last discharged -Overnight, pt was noted to be confused and agitated -Would resume psych meds as listed on 3/30 d/c summary.   Medications     ARIPiprazole  30 mg Oral QHS   benztropine  1 mg Oral QHS   enoxaparin (LOVENOX) injection  40 mg Subcutaneous Q24H   haloperidol  1 mg Oral Daily   insulin aspart  0-15 Units Subcutaneous TID WC   insulin aspart  0-5 Units Subcutaneous QHS   insulin aspart  8 Units Subcutaneous TID WC   [START ON 02/28/2023] insulin detemir  45 Units Subcutaneous Daily   insulin detemir  5 Units Subcutaneous Once   metoprolol succinate  12.5 mg Oral Daily     Data Reviewed:    CBG:  Recent Labs  Lab 02/26/23 1640 02/26/23 2044 02/26/23 2231 02/27/23 0841 02/27/23 1157  GLUCAP 166* 249* 271* 408* 397*    SpO2: 97 %    Vitals:   02/26/23 2047 02/27/23 0440 02/27/23 0844 02/27/23 1643  BP: (!) 142/68 (!) 137/56 (!) 140/52 136/60  Pulse: 88 79 79 75  Resp:  17 15 19   Temp: 99.5 F (37.5 C) (!) 97.4 F (36.3 C) 98.3 F (36.8 C) 99.2 F (37.3 C)  TempSrc: Oral Oral Oral Oral  SpO2: 95% 100% 99% 97%  Weight:          Data Reviewed:  Basic Metabolic Panel: Recent Labs  Lab 02/25/23 1311 02/25/23 1548 02/26/23 0224 02/27/23 0039  NA 127* 127* 129* 131*  K 4.6 4.6 3.8 3.9  CL 98  --  98 104  CO2 18*  --  19* 19*  GLUCOSE 452*  --  324* 317*  BUN 32*  --  24* 23  CREATININE 1.34*  --  0.95 0.80  CALCIUM 9.6  --  9.2 8.5*  MG  --   --   --  1.8    CBC: Recent Labs  Lab 02/25/23 1311 02/25/23 1548 02/26/23 0224 02/27/23 0039  WBC 5.5  --  4.1 4.0  HGB 11.8* 11.9* 11.6* 11.0*  HCT 36.7 35.0* 34.9* 33.4*  MCV 81.7  --  79.7* 79.9*  PLT 158  --  144* 111*    LFT Recent Labs  Lab  02/25/23 1311 02/26/23 0224 02/27/23 0039  AST 18 10* 13*  ALT 13 13 10   ALKPHOS 130* 118 99  BILITOT 1.2 0.9 0.6  PROT 6.9 6.7 5.7*  ALBUMIN 3.3* 2.9* 2.5*     Antibiotics: Anti-infectives (From admission, onward)    Start     Dose/Rate Route Frequency Ordered Stop   02/25/23 1715  cefTRIAXone (ROCEPHIN) 1 g in sodium chloride 0.9 % 100 mL IVPB        1 g 200 mL/hr over 30 Minutes Intravenous Every 24 hours 02/25/23 1708     02/25/23 1600  cefTRIAXone (ROCEPHIN) 1 g in sodium chloride 0.9 % 100 mL IVPB        1 g 200 mL/hr over 30 Minutes Intravenous  Once 02/25/23 1556 02/25/23 1712        DVT prophylaxis: Lovenox  Code Status: Full code  Family Communication: Discussed with patient's boyfriend at bedside   CONSULTS    Objective    Physical Examination:   General-appears in no acute distress Heart-S1-S2, regular,  no murmur auscultated Lungs-clear to auscultation bilaterally, no wheezing or crackles auscultated Abdomen-soft, nontender, no organomegaly Extremities-no edema in the lower extremities Neuro-alert, oriented x3, no focal deficit noted  Status is: Inpatient:          Meredeth Ide   [facility] If 7PM-7AM, please contact night-coverage at [email or web address], Office  [phone]   02/27/2023, 5:25 PM  LOS: 2 days

## 2023-02-27 NOTE — Care Management Important Message (Signed)
Important Message  Patient Details  Name: ALEDA CARMENATE MRN: [id number] Date of Birth: 1951/04/04   Medicare Important Message Given:  Yes     Sherilyn Banker 02/27/2023, 12:43 PM

## 2023-02-28 DIAGNOSIS — N3000 Acute cystitis without hematuria: Secondary | ICD-10-CM | POA: Diagnosis not present

## 2023-02-28 DIAGNOSIS — R739 Hyperglycemia, unspecified: Secondary | ICD-10-CM | POA: Diagnosis not present

## 2023-02-28 DIAGNOSIS — G9340 Encephalopathy, unspecified: Secondary | ICD-10-CM | POA: Diagnosis not present

## 2023-02-28 LAB — GLUCOSE, CAPILLARY
Glucose-Capillary: 64 mg/dL — ABNORMAL LOW (ref 70–99)
Glucose-Capillary: 235 mg/dL — ABNORMAL HIGH (ref 70–99)
Glucose-Capillary: 63 mg/dL — ABNORMAL LOW (ref 70–99)
Glucose-Capillary: 150 mg/dL — ABNORMAL HIGH (ref 70–99)
Glucose-Capillary: 72 mg/dL (ref 70–99)

## 2023-02-28 LAB — BASIC METABOLIC PANEL
Anion gap: 13 (ref 5–15)
BUN: 17 mg/dL (ref 8–23)
CO2: 23 mmol/L (ref 22–32)
Calcium: 8.4 mg/dL — ABNORMAL LOW (ref 8.9–10.3)
Chloride: 101 mmol/L (ref 98–111)
Creatinine, Ser: 0.59 mg/dL (ref 0.44–1.00)
GFR, Estimated: >60 mL/min (ref >60)
Glucose, Bld: 147 mg/dL — ABNORMAL HIGH (ref 70–99)
Potassium: 3.6 mmol/L (ref 3.5–5.1)
Sodium: 137 mmol/L (ref 135–145)

## 2023-02-28 MED ORDER — INSULIN ASPART PROT & ASPART (70-30 MIX) 100 UNIT/ML ~~LOC~~ SUSP
20.0000 [IU] | Freq: Two times a day (BID) | SUBCUTANEOUS | Status: DC
  Administered 2023-03-01 (×2): 20 [IU] via SUBCUTANEOUS
  Filled 2023-02-28: qty 10

## 2023-02-28 MED ORDER — MELATONIN 3 MG PO TABS
6.0000 mg | ORAL_TABLET | Freq: Every evening | ORAL | Status: DC | PRN
  Administered 2023-02-28 – 2023-03-01 (×2): 6 mg via ORAL
  Filled 2023-02-28 (×2): qty 2

## 2023-02-28 MED ORDER — INSULIN ASPART 100 UNIT/ML IJ SOLN: 5.0000 [IU] | Freq: Three times a day (TID) | INTRAMUSCULAR | Status: DC

## 2023-02-28 NOTE — Progress Notes (Signed)
   02/28/23 1000  Mobility  Activity Ambulated with assistance to bathroom  Level of Assistance Standby assist, set-up cues, supervision of patient - no hands on  Assistive Device None  Distance Ambulated (ft) 10 ft  Activity Response Tolerated well  Mobility Referral Yes  $Mobility charge 1 Mobility   Mobility Specialist Progress Note  Pt was in bed and agreeable. Had no c/o pain. Left in bed w/ call bell in reach and alarm on.   Anastasia Pall Mobility Specialist  Please contact via SecureChat or Rehab office at [phone]

## 2023-02-28 NOTE — Progress Notes (Signed)
    Patient Name: KARMESHA FLIPPO           DOB: Mar 23, 1951  MRN: [id number]      Admission Date: 02/25/2023  Attending Provider: Meredeth Ide, MD  Primary Diagnosis: Hyperglycemia   Level of care: Med-Surg    CROSS COVER NOTE   Date of Service   02/28/2023   [name], 72 y.o. female, was admitted on 02/25/2023 for Hyperglycemia.    HPI/Events of Note   Hypoglycemic, CBG 54.  Dinner insulin was held.  No other concerns reported at this time.   Patient's CBG improved after being given crackers and orange juice.  RN has been asked to continue monitoring CBG every 4 for tonight.   Interventions/ Plan   Above        Anthoney Harada, DNP, [facility] [place]

## 2023-02-28 NOTE — Progress Notes (Addendum)
Chat to [facility] NP informing her that Pt had CBG of 54. Pt's CBG's were elevated all day. Pt did not eat dinner this evening. Pt given orange juice with peanut butter and crackers. And currently coherent. Discussed with Virgel Manifold NP that I will recheck blood sugar in a few minutes and inform her. Virgel Manifold NP agreed.

## 2023-02-28 NOTE — Progress Notes (Signed)
[facility]  PROGRESS NOTE  [name] BWG:[id number] DOB: Jul 13, 1951 DOA: 02/25/2023 PCP: Ardith Dark, MD   Brief HPI:    72 y.o. female with medical history significant of DM2 with prior admits for DKA, HTN, depression, schizophrenia who was recently admitted for DKA and discharged. Pt reportedly had been without insulin over the past week. Went to PCP where pt was found to have glucose in excess of 500. Pt was then referred to ED for further work up    Subjective   Patient blood glucose again dropped 60s at lunchtime.   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 -Markedly elevated blood glucose secondary to gross medical noncompliance -Insulin resumed, glucose improved -Patient was started on Levemir 45 units subcu daily along with 8 units of NovoLog kidney meal coverage -Patient blood glucose has been ranging from 400s to less than 160s and 70s -She is already on moderate sliding scale insulin with NovoLog -Hemoglobin A1c is 14.1 -Will discontinue Levemir, as patient blood glucose seems to drop significantly after she gets Levemir during daytime -Will start NovoLog Mix 70/30,  20 units subcu twice daily, starting from tomorrow morning -Discontinue meal coverage -Continue moderate sliding scale with NovoLog  Hyponatremia -Resolved  UTI -UA was abnormal, with cloudy urine -Urine culture not obtained -Patient started on ceftriaxone -Will treat for 3 days and then stop  Toxic metabolic encephalopathy -In setting of hyperglycemia, UTI -Resolved  Acute kidney injury -Resolved with IV fluids   Schizophrenia -Per pharmacy, psych meds listed on recent d/c summary were never filled since she was last discharged -Overnight, pt was noted to be confused and agitated -Would resume psych meds as listed on 3/30 d/c summary.   Medications     ARIPiprazole  30 mg Oral QHS   benztropine  1 mg Oral QHS   enoxaparin  (LOVENOX) injection  40 mg Subcutaneous Q24H   haloperidol  1 mg Oral Daily   insulin aspart  0-15 Units Subcutaneous TID WC   insulin aspart  0-5 Units Subcutaneous QHS   [START ON 03/01/2023] insulin aspart protamine- aspart  20 Units Subcutaneous BID WC   metoprolol succinate  12.5 mg Oral Daily     Data Reviewed:   CBG:  Recent Labs  Lab 02/27/23 2122 02/27/23 2209 02/28/23 0602 02/28/23 0832 02/28/23 1208  GLUCAP 62* 102* 64* 235* 63*    SpO2: 98 %    Vitals:   02/27/23 0844 02/27/23 1643 02/27/23 2032 02/28/23 0834  BP: (!) 140/52 136/60 131/67 139/64  Pulse: 79 75 82 83  Resp: 15 19  17   Temp: 98.3 F (36.8 C) 99.2 F (37.3 C) 98.3 F (36.8 C) 98.5 F (36.9 C)  TempSrc: Oral Oral Oral Oral  SpO2: 99% 97% 96% 98%  Weight:          Data Reviewed:  Basic Metabolic Panel: Recent Labs  Lab 02/25/23 1311 02/25/23 1548 02/26/23 0224 02/27/23 0039 02/28/23 0030  NA 127* 127* 129* 131* 137  K 4.6 4.6 3.8 3.9 3.6  CL 98  --  98 104 101  CO2 18*  --  19* 19* 23  GLUCOSE 452*  --  324* 317* 147*  BUN 32*  --  24* 23 17  CREATININE 1.34*  --  0.95 0.80 0.59  CALCIUM 9.6  --  9.2 8.5* 8.4*  MG  --   --   --  1.8  --     CBC: Recent Labs  Lab 02/25/23 1311 02/25/23 1548 02/26/23 0224 02/27/23 0039  WBC 5.5  --  4.1 4.0  HGB 11.8* 11.9* 11.6* 11.0*  HCT 36.7 35.0* 34.9* 33.4*  MCV 81.7  --  79.7* 79.9*  PLT 158  --  144* 111*    LFT Recent Labs  Lab 02/25/23 1311 02/26/23 0224 02/27/23 0039  AST 18 10* 13*  ALT 13 13 10   ALKPHOS 130* 118 99  BILITOT 1.2 0.9 0.6  PROT 6.9 6.7 5.7*  ALBUMIN 3.3* 2.9* 2.5*     Antibiotics: Anti-infectives (From admission, onward)    Start     Dose/Rate Route Frequency Ordered Stop   02/25/23 1715  cefTRIAXone (ROCEPHIN) 1 g in sodium chloride 0.9 % 100 mL IVPB        1 g 200 mL/hr over 30 Minutes Intravenous Every 24 hours 02/25/23 1708     02/25/23 1600  cefTRIAXone (ROCEPHIN) 1 g in sodium  chloride 0.9 % 100 mL IVPB        1 g 200 mL/hr over 30 Minutes Intravenous  Once 02/25/23 1556 02/25/23 1712        DVT prophylaxis: Lovenox  Code Status: Full code  Family Communication: Discussed with patient's boyfriend at bedside   CONSULTS    Objective    Physical Examination:  Appears in no acute distress S1-S2, regular, no murmur auscultated Lungs clear to auscultation bilaterally Abdomen is soft, nontender, no organomegaly  Status is: Inpatient:          Meredeth Ide   [facility] If 7PM-7AM, please contact night-coverage at [email or web address], Office  [phone]   02/28/2023, 3:14 PM  LOS: 3 days

## 2023-02-28 NOTE — Inpatient Diabetes Management (Signed)
Inpatient Diabetes Program Recommendations  AACE/ADA: New Consensus Statement on Inpatient Glycemic Control (2015)  Target Ranges:  Prepandial:   less than 140 mg/dL      Peak postprandial:   less than 180 mg/dL (1-2 hours)      Critically ill patients:  140 - 180 mg/dL   Lab Results  Component Value Date   GLUCAP 63 (L) 02/28/2023   HGBA1C 14.1 (H) 02/13/2023    Latest Reference Range & Units 02/27/23 21:22 02/27/23 22:09 02/28/23 06:02 02/28/23 08:32 02/28/23 12:08  Glucose-Capillary 70 - 99 mg/dL 62 (L) 544 (H) 64 (L) 920 (H) 63 (L)  (L): Data is abnormally low (H): Data is abnormally high Review of Glycemic Control  Diabetes history: type 2 Outpatient Diabetes medications: Tresiba 38 units daily, fiasp flextouch 8 units TID Current orders for Inpatient glycemic control: Levemir 45 units daily, Novolog 0-15 units correction scale TID, 0-5 units at HS, Novolog 5 units TID  Inpatient Diabetes Program Recommendations:   Noted that blood sugars have been less than 70 mg/dl X3. Recommend decreasing Novolog meal coverage to 2 units TID if blood sugars continue to be less than 70 mg/dl.  Smith Mince RN BSN CDE Diabetes Coordinator Pager: [phone]  8am-5pm

## 2023-03-01 DIAGNOSIS — G9340 Encephalopathy, unspecified: Secondary | ICD-10-CM | POA: Diagnosis not present

## 2023-03-01 DIAGNOSIS — N179 Acute kidney failure, unspecified: Secondary | ICD-10-CM | POA: Diagnosis not present

## 2023-03-01 DIAGNOSIS — R739 Hyperglycemia, unspecified: Secondary | ICD-10-CM | POA: Diagnosis not present

## 2023-03-01 DIAGNOSIS — N3000 Acute cystitis without hematuria: Secondary | ICD-10-CM | POA: Diagnosis not present

## 2023-03-01 LAB — GLUCOSE, CAPILLARY
Glucose-Capillary: 104 mg/dL — ABNORMAL HIGH (ref 70–99)
Glucose-Capillary: 148 mg/dL — ABNORMAL HIGH (ref 70–99)
Glucose-Capillary: 233 mg/dL — ABNORMAL HIGH (ref 70–99)
Glucose-Capillary: 271 mg/dL — ABNORMAL HIGH (ref 70–99)
Glucose-Capillary: 278 mg/dL — ABNORMAL HIGH (ref 70–99)

## 2023-03-01 MED ORDER — CLOTRIMAZOLE 1 % EX CREA
TOPICAL_CREAM | Freq: Two times a day (BID) | CUTANEOUS | Status: DC
  Filled 2023-03-01: qty 15

## 2023-03-01 MED ORDER — INSULIN ASPART 100 UNIT/ML IJ SOLN
0.0000 [IU] | Freq: Three times a day (TID) | INTRAMUSCULAR | Status: DC
  Administered 2023-03-01: 5 [IU] via SUBCUTANEOUS
  Administered 2023-03-02: 3 [IU] via SUBCUTANEOUS
  Administered 2023-03-02: 9 [IU] via SUBCUTANEOUS

## 2023-03-01 NOTE — Progress Notes (Signed)
[facility]  PROGRESS NOTE  [name] DDU:[id number] DOB: 10-07-1951 DOA: 02/25/2023 PCP: Ardith Dark, MD   Brief HPI:    72 y.o. female with medical history significant of DM2 with prior admits for DKA, HTN, depression, schizophrenia who was recently admitted for DKA and discharged. Pt reportedly had been without insulin over the past week. Went to PCP where pt was found to have glucose in excess of 500. Pt was then referred to ED for further work up    Subjective   Patient seen and examined, denies any complaints.  Started on NovoLog 70/30; 20 units subcu twice daily from today.   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 -Markedly elevated blood glucose secondary to gross medical noncompliance -Insulin resumed, glucose improved -Patient was started on Levemir 45 units subcu daily along with 8 units of NovoLog kidney meal coverage -Patient blood glucose has been ranging from 400s to less than 160s and 70s -She is already on moderate sliding scale insulin with NovoLog -Hemoglobin A1c is 14.1 -Will discontinue Levemir, as patient blood glucose seems to drop significantly after she gets Levemir during daytime -Started on  NovoLog Mix 70/30,  20 units subcu twice daily,  -Discontinued meal coverage -Will change sliding scale insulin to sensitive  Hyponatremia -Resolved  UTI -UA was abnormal, with cloudy urine -Urine culture not obtained -Patient started on ceftriaxone -Treated for 3 days.  Toxic metabolic encephalopathy -In setting of hyperglycemia, UTI -Resolved  Acute kidney injury -Resolved with IV fluids   Schizophrenia -Per pharmacy, psych meds listed on recent d/c summary were never filled since she was last discharged -Overnight, pt was noted to be confused and agitated -Would resume psych meds as listed on 3/30 d/c summary.   Medications     ARIPiprazole  30 mg Oral QHS   benztropine  1 mg Oral QHS    enoxaparin (LOVENOX) injection  40 mg Subcutaneous Q24H   haloperidol  1 mg Oral Daily   insulin aspart  0-9 Units Subcutaneous TID WC   insulin aspart protamine- aspart  20 Units Subcutaneous BID WC   metoprolol succinate  12.5 mg Oral Daily     Data Reviewed:   CBG:  Recent Labs  Lab 02/28/23 1715 02/28/23 2054 03/01/23 0005 03/01/23 0832 03/01/23 1209  GLUCAP 150* 72 148* 271* 104*    SpO2: 99 %    Vitals:   02/28/23 1828 02/28/23 2053 03/01/23 0528 03/01/23 0833  BP: (!) 143/75 (!) 144/69 (!) 150/80 (!) 119/59  Pulse: 79 81 74 85  Resp: 17 16 16 17   Temp: 98.5 F (36.9 C) 98.4 F (36.9 C) 98.3 F (36.8 C) 98.2 F (36.8 C)  TempSrc: Oral Oral Oral Oral  SpO2: 96% 97% 100% 99%  Weight:          Data Reviewed:  Basic Metabolic Panel: Recent Labs  Lab 02/25/23 1311 02/25/23 1548 02/26/23 0224 02/27/23 0039 02/28/23 0030  NA 127* 127* 129* 131* 137  K 4.6 4.6 3.8 3.9 3.6  CL 98  --  98 104 101  CO2 18*  --  19* 19* 23  GLUCOSE 452*  --  324* 317* 147*  BUN 32*  --  24* 23 17  CREATININE 1.34*  --  0.95 0.80 0.59  CALCIUM 9.6  --  9.2 8.5* 8.4*  MG  --   --   --  1.8  --     CBC: Recent Labs  Lab 02/25/23 1311  02/25/23 1548 02/26/23 0224 02/27/23 0039  WBC 5.5  --  4.1 4.0  HGB 11.8* 11.9* 11.6* 11.0*  HCT 36.7 35.0* 34.9* 33.4*  MCV 81.7  --  79.7* 79.9*  PLT 158  --  144* 111*    LFT Recent Labs  Lab 02/25/23 1311 02/26/23 0224 02/27/23 0039  AST 18 10* 13*  ALT ALKPHOS 130* 118 99  BILITOT 1.2 0.9 0.6  PROT 6.9 6.7 5.7*  ALBUMIN 3.3* 2.9* 2.5*     Antibiotics: Anti-infectives (From admission, onward)    Start     Dose/Rate Route Frequency Ordered Stop   02/25/23 1715  cefTRIAXone (ROCEPHIN) 1 g in sodium chloride 0.9 % 100 mL IVPB  Status:  Discontinued        1 g 200 mL/hr over 30 Minutes Intravenous Every 24 hours 02/25/23 1708 02/28/23 1519   02/25/23 1600  cefTRIAXone (ROCEPHIN) 1 g in sodium chloride  0.9 % 100 mL IVPB        1 g 200 mL/hr over 30 Minutes Intravenous  Once 02/25/23 1556 02/25/23 1712        DVT prophylaxis: Lovenox  Code Status: Full code  Family Communication: Discussed with patient's boyfriend at bedside   CONSULTS    Objective    Physical Examination:  General-appears in no acute distress Heart-S1-S2, regular, no murmur auscultated Lungs-clear to auscultation bilaterally, no wheezing or crackles auscultated Abdomen-soft, nontender, no organomegaly Extremities-no edema in the lower extremities Neuro-alert, oriented x3, no focal deficit noted  Status is: Inpatient:          Meredeth Ide   [facility] If 7PM-7AM, please contact night-coverage at [email or web address], Office  [phone]   03/01/2023, 12:22 PM  LOS: 4 days

## 2023-03-01 NOTE — Care Management Important Message (Signed)
Important Message  Patient Details  Name: [name] MRN: [id number] Date of Birth: 05-12-1951   Medicare Important Message Given:  Yes     Sherilyn Banker 03/01/2023, 1:23 PM

## 2023-03-02 DIAGNOSIS — N3000 Acute cystitis without hematuria: Secondary | ICD-10-CM | POA: Diagnosis not present

## 2023-03-02 DIAGNOSIS — R739 Hyperglycemia, unspecified: Secondary | ICD-10-CM | POA: Diagnosis not present

## 2023-03-02 DIAGNOSIS — N179 Acute kidney failure, unspecified: Secondary | ICD-10-CM | POA: Diagnosis not present

## 2023-03-02 LAB — GLUCOSE, CAPILLARY
Glucose-Capillary: 526 mg/dL (ref 70–99)
Glucose-Capillary: 537 mg/dL (ref 70–99)
Glucose-Capillary: 461 mg/dL — ABNORMAL HIGH (ref 70–99)
Glucose-Capillary: 327 mg/dL — ABNORMAL HIGH (ref 70–99)
Glucose-Capillary: 250 mg/dL — ABNORMAL HIGH (ref 70–99)

## 2023-03-02 MED ORDER — BENZTROPINE MESYLATE 1 MG PO TABS: 1.0000 mg | ORAL_TABLET | Freq: Every day | ORAL | 1 refills | Status: DC

## 2023-03-02 MED ORDER — "INSULIN SYRINGE 31G X 5/16"" 0.5 ML MISC": 2 refills | Status: DC

## 2023-03-02 MED ORDER — ARIPIPRAZOLE 30 MG PO TABS: 30.0000 mg | ORAL_TABLET | Freq: Every day | ORAL | 2 refills | Status: DC

## 2023-03-02 MED ORDER — HALOPERIDOL 1 MG PO TABS: 1.0000 mg | ORAL_TABLET | Freq: Every day | ORAL | 2 refills | Status: DC

## 2023-03-02 MED ORDER — BUPROPION HCL 75 MG PO TABS: 75.0000 mg | ORAL_TABLET | Freq: Two times a day (BID) | ORAL | 2 refills | Status: DC

## 2023-03-02 MED ORDER — INSULIN ASPART PROT & ASPART (70-30 MIX) 100 UNIT/ML ~~LOC~~ SUSP
25.0000 [IU] | Freq: Two times a day (BID) | SUBCUTANEOUS | Status: DC
  Administered 2023-03-02: 25 [IU] via SUBCUTANEOUS
  Filled 2023-03-02: qty 10

## 2023-03-02 MED ORDER — INSULIN ASPART PROT & ASPART (70-30 MIX) 100 UNIT/ML ~~LOC~~ SUSP: 25.0000 [IU] | Freq: Two times a day (BID) | SUBCUTANEOUS | 2 refills | Status: DC

## 2023-03-02 MED ORDER — INSULIN PEN NEEDLE 32G X 4 MM MISC: 3 refills | Status: DC

## 2023-03-02 NOTE — Progress Notes (Signed)
Patient's sister Woody Seller will like to know why patient's psych medication were not started since she got admitted.She is concern about  her confusion.On call  provider  A Chavez,NP informed,will continue to monitor.

## 2023-03-02 NOTE — Discharge Summary (Signed)
Physician Discharge Summary   Patient: [name] MRN: [id number] DOB: 06-Jan-1951  Admit date:     02/25/2023  Discharge date: 03/02/23  Discharge Physician: Meredeth Ide   PCP: Ardith Dark, MD   Recommendations at discharge:   Follow-up PCP in 1 week  Discharge Diagnoses: Principal Problem:   Hyperglycemia  Resolved Problems:   * No resolved hospital problems. *  Hospital Course:  72 y.o. female with medical history significant of DM2 with prior admits for DKA, HTN, depression, schizophrenia who was recently admitted for DKA and discharged. Pt reportedly had been without insulin over the past week. Went to PCP where pt was found to have glucose in excess of 500. 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 -Markedly elevated blood glucose secondary to gross medical noncompliance -Insulin resumed, glucose improved -Patient was started on Levemir 45 units subcu daily along with 8 units of NovoLog kidney meal coverage -Patient blood glucose has been ranging from 400s to less than 160s and 70s -She is already on moderate sliding scale insulin with NovoLog -Hemoglobin A1c is 14.1 -Will discontinue Levemir, as patient blood glucose seems to drop significantly after she gets Levemir during daytime -Started on NovoLog Mix 70/30 20 units subcu twice daily, -Dose increased to 25 units subcu twice daily today. -Blood glucose is better controlled, will discharge on NovoLog Mix 70/30 25 units subcu twice daily -Patient will check blood sugar every day and follow-up with PCP in 1 week   Hyponatremia -Resolved   UTI -UA was abnormal, with cloudy urine -Urine culture not obtained -Patient started on ceftriaxone -Will treat for 3 days and then stop   Toxic metabolic encephalopathy -In setting of hyperglycemia, UTI -Resolved   Acute kidney injury -Resolved with IV fluids      Schizophrenia -Per pharmacy, psych meds listed on recent d/c summary were never filled since she was last discharged -Overnight, pt was noted to be confused and agitated -Would resume psych meds as listed on 3/30 d/c summary. -Also resume Cogentin, bupropion          Consultants:  Procedures performed:  Disposition: Home Diet recommendation:  Discharge Diet Orders (From admission, onward)     Start     Ordered   03/02/23 0000  Diet - low sodium heart healthy        03/02/23 1320           Carb modified diet DISCHARGE MEDICATION: Allergies as of 03/02/2023       Reactions   Penicillins Rash, Hives   Did it involve swelling of the face/tongue/throat, SOB, or low BP? Unknown Did it involve sudden or severe rash/hives, skin peeling, or any reaction on the inside of your mouth or nose? Unknown Did you need to seek medical attention at a hospital or doctor's office? Unknown When did it last happen?       If all above answers are "NO", may proceed with cephalosporin use. Did it involve swelling of the face/tongue/throat, SOB, or low BP? Unknown Did it involve sudden or severe rash/hives, skin peeling, or any reaction on the inside of your mouth or nose? Unknown Did you need to seek medical attention at a hospital or doctor's office? Unknown When did it last happen?       If all above answers are "NO", may proceed with cephalosporin use.  Medication List     STOP taking these medications    clotrimazole 1 % cream Commonly known as: LOTRIMIN   clotrimazole-betamethasone cream Commonly known as: LOTRISONE   diclofenac 75 MG EC tablet Commonly known as: VOLTAREN   Fiasp FlexTouch 100 UNIT/ML FlexTouch Pen Generic drug: insulin aspart   potassium chloride 10 MEQ tablet Commonly known as: KLOR-CON   Tresiba FlexTouch 100 UNIT/ML FlexTouch Pen Generic drug: insulin degludec       TAKE these medications    albuterol 108 (90 Base) MCG/ACT  inhaler Commonly known as: VENTOLIN HFA TAKE 2 PUFFS BY MOUTH EVERY 6 HOURS AS NEEDED FOR WHEEZE OR SHORTNESS OF BREATH   ARIPiprazole 30 MG tablet Commonly known as: ABILIFY Take 1 tablet (30 mg total) by mouth at bedtime.   benztropine 1 MG tablet Commonly known as: COGENTIN Take 1 tablet (1 mg total) by mouth at bedtime.   buPROPion 75 MG tablet Commonly known as: WELLBUTRIN Take 1 tablet (75 mg total) by mouth 2 (two) times daily.   Dexcom G6 Receiver Devi Use as directed for continuous glucose monitoring.   FreeStyle Libre 2 Sensor Misc 1 each by Does not apply route every 14 (fourteen) days.   haloperidol 1 MG tablet Commonly known as: HALDOL Take 1 tablet (1 mg total) by mouth daily. Start taking on: March 03, 2023   insulin aspart protamine- aspart (70-30) 100 UNIT/ML injection Commonly known as: NOVOLOG MIX 70/30 Inject 0.25 mLs (25 Units total) into the skin 2 (two) times daily with a meal.   Insulin Pen Needle 32G X 4 MM Misc Use with Insulin pen   INSULIN SYRINGE .5CC/31GX5/16" 31G X 5/16" 0.5 ML Misc Use as directed   metoprolol succinate 25 MG 24 hr tablet Commonly known as: TOPROL-XL Take 0.5 tablets (12.5 mg total) by mouth daily.   Oysco 500 500 MG Tabs Generic drug: Oyster Shell Calcium Take 1 tablet (1,250 mg total) by mouth daily.        Follow-up Information     [facility] Follow up.   Specialty: Home Health Services Contact information: [address] [place] [address] [phone]                Discharge Exam: Ceasar Mons Weights   02/26/23 0454  Weight: 63 kg   General-appears in no acute distress Heart-S1-S2, regular, no murmur auscultated Lungs-clear to auscultation bilaterally, no wheezing or crackles auscultated Abdomen-soft, nontender, no organomegaly Extremities-no edema in the lower extremities Neuro-alert, oriented x3, no focal deficit noted  Condition at discharge: good  The results  of significant diagnostics from this hospitalization (including imaging, microbiology, ancillary and laboratory) are listed below for reference.   Imaging Studies: DG Chest 2 View  Result Date: 02/25/2023 CLINICAL DATA:  Cough, diminished lung sounds. EXAM: CHEST - 2 VIEW COMPARISON:  02/12/2023. FINDINGS: Patient is rotated. Trachea is midline. Heart size stable. Thoracic aorta is calcified. Increased streaky densities in the right upper lobe. Mild streaky scarring in the lung bases. No pleural fluid. IMPRESSION: Slight increase in streaky densities in the right upper lobe may represent infectious bronchiolitis. Electronically Signed   By: Leanna Battles M.D.   On: 02/25/2023 15:47   ECHOCARDIOGRAM COMPLETE  Result Date: 02/14/2023    ECHOCARDIOGRAM REPORT   Patient Name:   [facility] Date of Exam: 02/14/2023 Medical Rec #:  [id number]           Height:       65.0  in Accession #:    [id number]          Weight:       150.0 lb Date of Birth:  1951/05/20           BSA:          1.750 m Patient Age:    53 years            BP:           127/60 mmHg Patient Gender: F                   HR:           80 bpm. Exam Location:  Inpatient Procedure: 2D Echo, Cardiac Doppler and Color Doppler Indications:    R07.9* Chest pain, unspecified  History:        Patient has prior history of Echocardiogram examinations, most                 recent 11/26/2020. Abnormal ECG, Arrythmias:Tachycardia and                 Cardiac Arrest; Risk Factors:Hypertension, Diabetes,                 Dyslipidemia and Current Smoker.  Sonographer:    Sheralyn Boatman RDCS Referring Phys: [id number] [facility] IMPRESSIONS  1. Left ventricular ejection fraction, by estimation, is 60 to 65%. The left ventricle has normal function. The left ventricle has no regional wall motion abnormalities. Left ventricular diastolic parameters are consistent with Grade I diastolic dysfunction (impaired relaxation). Elevated left atrial pressure.  2. Right  ventricular systolic function is normal. The right ventricular size is normal.  3. The mitral valve is normal in structure. No evidence of mitral valve regurgitation. No evidence of mitral stenosis.  4. The aortic valve has an indeterminant number of cusps. There is mild calcification of the aortic valve. There is mild thickening of the aortic valve. Aortic valve regurgitation is mild to moderate. No aortic stenosis is present.  5. Aortic dilatation noted. 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  6. The inferior vena cava is normal in size with greater than 50% respiratory variability, suggesting right atrial pressure of 3 mmHg. FINDINGS  Left Ventricle: Left ventricular ejection fraction, by estimation, is 60 to 65%. The left ventricle has normal function. The left ventricle has no regional wall motion abnormalities. The left ventricular internal cavity size was normal in size. There is  no left ventricular hypertrophy. Left ventricular diastolic parameters are consistent with Grade I diastolic dysfunction (impaired relaxation). Elevated left atrial pressure. Right Ventricle: The right ventricular size is normal. Right vetricular wall thickness was not well visualized. Right ventricular systolic function is normal. Left Atrium: Left atrial size was normal in size. Right Atrium: Right atrial size was normal in size. Pericardium: There is no evidence of pericardial effusion. Mitral Valve: The mitral valve is normal in structure. No evidence of mitral valve regurgitation. No evidence of mitral valve stenosis. Tricuspid Valve: The tricuspid valve is normal in structure. Tricuspid valve regurgitation is not demonstrated. No evidence of tricuspid stenosis. Aortic Valve: The aortic valve has an indeterminant number of cusps. There is mild calcification of the aortic valve. There is mild thickening of the aortic valve. There is mild aortic valve annular calcification. Aortic valve regurgitation is  mild to moderate. Aortic regurgitation PHT measures 619 msec. No aortic stenosis is present. Aortic valve mean gradient measures 8.0  mmHg. Aortic valve peak gradient measures 15.5 mmHg. Aortic valve area, by VTI measures 2.78 cm. Pulmonic Valve: The pulmonic valve was not well visualized. Pulmonic valve regurgitation is not visualized. No evidence of pulmonic stenosis. Aorta: Aortic dilatation noted and the aortic root is normal in size and structure. 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 Venous: The inferior vena cava is normal in size with greater than 50% respiratory variability, suggesting right atrial pressure of 3 mmHg. IAS/Shunts: No atrial level shunt detected by color flow Doppler.  LEFT VENTRICLE PLAX 2D LVIDd:         4.30 cm     Diastology LVIDs:         3.30 cm     LV e' medial:    3.92 cm/s LV PW:         1.10 cm     LV E/e' medial:  19.8 LV IVS:        1.00 cm     LV e' lateral:   6.31 cm/s LVOT diam:     2.20 cm     LV E/e' lateral: 12.3 LV SV:         100 LV SV Index:   57 LVOT Area:     3.80 cm  LV Volumes (MOD) LV vol d, MOD A2C: 81.7 ml LV vol d, MOD A4C: 74.6 ml LV vol s, MOD A2C: 30.2 ml LV vol s, MOD A4C: 32.5 ml LV SV MOD A2C:     51.5 ml LV SV MOD A4C:     74.6 ml LV SV MOD BP:      49.2 ml RIGHT VENTRICLE             IVC RV S prime:     18.00 cm/s  IVC diam: 2.20 cm TAPSE (M-mode): 2.0 cm LEFT ATRIUM             Index        RIGHT ATRIUM           Index LA diam:        3.40 cm 1.94 cm/m   RA Area:     10.40 cm LA Vol (A2C):   46.1 ml 26.34 ml/m  RA Volume:   17.10 ml  9.77 ml/m LA Vol (A4C):   24.3 ml 13.88 ml/m LA Biplane Vol: 33.7 ml 19.25 ml/m  AORTIC VALVE AV Area (Vmax):    2.49 cm AV Area (Vmean):   2.55 cm AV Area (VTI):     2.78 cm AV Vmax:           197.00 cm/s AV Vmean:          129.000 cm/s AV VTI:            0.361 m AV Peak Grad:      15.5 mmHg AV Mean Grad:      8.0 mmHg LVOT Vmax:         129.00 cm/s LVOT Vmean:        86.700 cm/s LVOT  VTI:          0.264 m LVOT/AV VTI ratio: 0.73 AI PHT:            619 msec  AORTA Ao Root diam: 3.00 cm Ao Asc diam:  3.60 cm MITRAL VALVE MV Area (PHT): 3.37 cm     SHUNTS MV Decel Time: 225 msec     Systemic VTI:  0.26 m MV E velocity: 77.60 cm/s  Systemic Diam: 2.20 cm MV A velocity: 115.00 cm/s MV E/A ratio:  0.67 Dina Rich MD Electronically signed by Dina Rich MD Signature Date/Time: 02/14/2023/2:36:41 PM    Final    CT HEAD WO CONTRAST ( )  Result Date: 02/13/2023 CLINICAL DATA:  Mental status change, unknown cause EXAM: CT HEAD WITHOUT CONTRAST TECHNIQUE: Contiguous axial images were obtained from the base of the skull through the vertex without intravenous contrast. RADIATION DOSE REDUCTION: This exam was performed according to the departmental dose-optimization program which includes automated exposure control, adjustment of the mA and/or kV according to patient size and/or use of iterative reconstruction technique. COMPARISON:  CT head 11/24/2020. 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 No evidence of acute large vascular territory infarct, acute hemorrhage, mass lesion midline shift. Vascular: No hyperdense vessel identified. Skull: No acute fracture. Sinuses/Orbits: No acute orbital findings. Opacified right ethmoid air cell. Other: No mastoid effusions. IMPRESSION: 1. No evidence of acute intracranial abnormality. 2. Remote right parietal infarct. Electronically Signed   By: Feliberto Harts M.D.   On: 02/13/2023 12:52   DG Knee Complete 4 Views Left  Result Date: 02/12/2023 CLINICAL DATA:  Fall 2 days ago with knee pain, initial encounter EXAM: LEFT KNEE - COMPLETE 4+ VIEW COMPARISON:  None Available. FINDINGS: No evidence of fracture, dislocation, or joint effusion. No evidence of arthropathy or other focal bone abnormality. Soft tissues are unremarkable. IMPRESSION: No acute abnormality noted. Electronically Signed   By:  Alcide Clever M.D.   On: 02/12/2023 22:43   DG FEMUR MIN 2 VIEWS LEFT  Result Date: 02/12/2023 CLINICAL DATA:  Fall 2 days ago with left leg pain, initial encounter EXAM: LEFT FEMUR 2 VIEWS COMPARISON:  None Available. FINDINGS: No acute fracture or dislocation is noted. No acute soft tissue abnormality is noted. Calcified uterine fibroid is noted in the left hemipelvis. IMPRESSION: No acute abnormality noted. Electronically Signed   By: Alcide Clever M.D.   On: 02/12/2023 22:41   DG Chest 2 View  Result Date: 02/12/2023 CLINICAL DATA:  Chest pain EXAM: CHEST - 2 VIEW COMPARISON:  11/27/2020 FINDINGS: Mild scarring in the right upper lung. No acute airspace disease or effusion. Stable cardiomediastinal silhouette. No pneumothorax. IMPRESSION: No active cardiopulmonary disease. Electronically Signed   By: Jasmine Pang M.D.   On: 02/12/2023 22:06   DG Knee Complete 4 Views Left  Result Date: 02/11/2023 CLINICAL DATA:  Pain EXAM: LEFT KNEE - COMPLETE 4+ VIEW COMPARISON:  None Available. FINDINGS: Apparent soft tissue swelling. No joint effusion. No fracture or dislocation. No bony lesion or bony erosion. No significant degenerative change identified. Mild enthesopathic changes off the superior patella. IMPRESSION: Apparent soft tissue swelling. No other significant abnormalities. Electronically Signed   By: Gerome Sam III M.D.   On: 02/11/2023 12:49    Microbiology: Results for orders placed or performed during the hospital encounter of 02/25/23  SARS Coronavirus 2 by RT PCR (hospital order, performed in [facility] hospital lab) *cepheid single result test* Anterior Nasal Swab     Status: None   Collection Time: 02/25/23  4:26 PM   Specimen: Anterior Nasal Swab  Result Value Ref Range Status   SARS Coronavirus 2 by RT PCR NEGATIVE NEGATIVE Final    Comment: Performed at [facility], [address] [place], [place] [address]    Labs: CBC: Recent Labs  Lab 02/25/23 1311  02/25/23 1548 02/26/23 0224 02/27/23 0039  WBC 5.5  --  4.1 4.0  HGB 11.8* 11.9* 11.6* 11.0*  HCT 36.7 35.0* 34.9* 33.4*  MCV 81.7  --  79.7* 79.9*  PLT 158  --  144* 111*   Basic Metabolic Panel: Recent Labs  Lab 02/25/23 1311 02/25/23 1548 02/26/23 0224 02/27/23 0039 02/28/23 0030  NA 127* 127* 129* 131* 137  K 4.6 4.6 3.8 3.9 3.6  CL 98  --  98 104 101  CO2 18*  --  19* 19* 23  GLUCOSE 452*  --  324* 317* 147*  BUN 32*  --  24* 23 17  CREATININE 1.34*  --  0.95 0.80 0.59  CALCIUM 9.6  --  9.2 8.5* 8.4*  MG  --   --   --  1.8  --    Liver Function Tests: Recent Labs  Lab 02/25/23 1311 02/26/23 0224 02/27/23 0039  AST 18 10* 13*  ALT ALKPHOS 130* 118 99  BILITOT 1.2 0.9 0.6  PROT 6.9 6.7 5.7*  ALBUMIN 3.3* 2.9* 2.5*   CBG: Recent Labs  Lab 03/02/23 0830 03/02/23 0859 03/02/23 0944 03/02/23 1041 03/02/23 1209  GLUCAP 537* 526* 461* 327* 250*    Discharge time spent: greater than 30 minutes.  Signed: Meredeth Ide, MD [facility] 03/02/2023

## 2023-03-05 ENCOUNTER — Telehealth: Payer: Self-pay

## 2023-03-05 NOTE — Transitions of Care (Post Inpatient/ED Visit) (Unsigned)
   03/05/2023  Name: [name] MRN: [id number] DOB: 08-Mar-1951  Today's TOC FU Call Status: Today's TOC FU Call Status:: Unsuccessul Call (1st Attempt) Unsuccessful Call (1st Attempt) Date: 03/05/23  Attempted to reach the patient regarding the most recent Inpatient/ED visit.  Follow Up Plan: Additional outreach attempts will be made to reach the patient to complete the Transitions of Care (Post Inpatient/ED visit) call.   Signature Karena Addison, LPN [facility] Nurse Health Advisor Direct Dial [phone]

## 2023-03-06 NOTE — Transitions of Care (Post Inpatient/ED Visit) (Unsigned)
   03/06/2023  Name: [name] MRN: [id number] DOB: 15-Feb-1951  Today's TOC FU Call Status: Today's TOC FU Call Status:: Unsuccessful Call (2nd Attempt) Unsuccessful Call (1st Attempt) Date: 03/05/23 Unsuccessful Call (2nd Attempt) Date: 03/06/23  Attempted to reach the patient regarding the most recent Inpatient/ED visit.  Follow Up Plan: Additional outreach attempts will be made to reach the patient to complete the Transitions of Care (Post Inpatient/ED visit) call.   Signature Karena Addison, LPN [facility] Nurse Health Advisor Direct Dial [phone]

## 2023-03-07 NOTE — Transitions of Care (Post Inpatient/ED Visit) (Signed)
   03/07/2023  Name: [name] MRN: [id number] DOB: 1950-12-28  Today's TOC FU Call Status: Today's TOC FU Call Status:: Unsuccessful Call (3rd Attempt) Unsuccessful Call (1st Attempt) Date: 03/05/23 Unsuccessful Call (2nd Attempt) Date: 03/06/23 Unsuccessful Call (3rd Attempt) Date: 03/07/23  Attempted to reach the patient regarding the most recent Inpatient/ED visit.  Follow Up Plan: No further outreach attempts will be made at this time. We have been unable to contact the patient.  Signature Karena Addison, LPN [facility] Nurse Health Advisor Direct Dial [phone]

## 2023-03-11 ENCOUNTER — Telehealth: Payer: Self-pay | Admitting: Family Medicine

## 2023-03-11 NOTE — Telephone Encounter (Signed)
Home Health Verbal Orders  Agency:  [facility]  Caller:[facility]  Contact and title Ph# [phone] -RN  Requesting Skilled nursing:    Reason for Request:  Diabetes Management  Frequency:   1 x per week every other week for 8 week, then PRN  HH needs F2F w/in last 30 days

## 2023-03-12 ENCOUNTER — Other Ambulatory Visit (HOSPITAL_BASED_OUTPATIENT_CLINIC_OR_DEPARTMENT_OTHER): Payer: Self-pay

## 2023-03-12 ENCOUNTER — Emergency Department (HOSPITAL_BASED_OUTPATIENT_CLINIC_OR_DEPARTMENT_OTHER): Payer: Medicare Other | Admitting: Radiology

## 2023-03-12 ENCOUNTER — Encounter (HOSPITAL_BASED_OUTPATIENT_CLINIC_OR_DEPARTMENT_OTHER): Payer: Self-pay

## 2023-03-12 ENCOUNTER — Emergency Department (HOSPITAL_BASED_OUTPATIENT_CLINIC_OR_DEPARTMENT_OTHER)
Admission: EM | Admit: 2023-03-12 | Discharge: 2023-03-12 | Disposition: A | Discharge status: Home / Self Care | Payer: Medicare Other | Source: Home / Self Care | Attending: Emergency Medicine | Admitting: Emergency Medicine

## 2023-03-12 ENCOUNTER — Other Ambulatory Visit: Payer: Self-pay

## 2023-03-12 DIAGNOSIS — L97519 Non-pressure chronic ulcer of other part of right foot with unspecified severity: Secondary | ICD-10-CM | POA: Insufficient documentation

## 2023-03-12 DIAGNOSIS — I10 Essential (primary) hypertension: Secondary | ICD-10-CM | POA: Insufficient documentation

## 2023-03-12 DIAGNOSIS — E10621 Type 1 diabetes mellitus with foot ulcer: Secondary | ICD-10-CM | POA: Insufficient documentation

## 2023-03-12 DIAGNOSIS — Z79899 Other long term (current) drug therapy: Secondary | ICD-10-CM | POA: Insufficient documentation

## 2023-03-12 DIAGNOSIS — Z794 Long term (current) use of insulin: Secondary | ICD-10-CM | POA: Insufficient documentation

## 2023-03-12 DIAGNOSIS — L03031 Cellulitis of right toe: Secondary | ICD-10-CM | POA: Insufficient documentation

## 2023-03-12 DIAGNOSIS — A419 Sepsis, unspecified organism: Secondary | ICD-10-CM | POA: Diagnosis not present

## 2023-03-12 LAB — CBG MONITORING, ED: Glucose-Capillary: 281 mg/dL — ABNORMAL HIGH (ref 70–99)

## 2023-03-12 LAB — CBC
HCT: 34 % — ABNORMAL LOW (ref 36.0–46.0)
Hemoglobin: 10.6 g/dL — ABNORMAL LOW (ref 12.0–15.0)
MCH: 26.2 pg (ref 26.0–34.0)
MCHC: 31.2 g/dL (ref 30.0–36.0)
MCV: 84.2 fL (ref 80.0–100.0)
Platelets: 228 10*3/uL (ref 150–400)
RBC: 4.04 MIL/uL (ref 3.87–5.11)
RDW: 15.7 % — ABNORMAL HIGH (ref 11.5–15.5)
WBC: 4.8 10*3/uL (ref 4.0–10.5)
nRBC: 0 % (ref 0.0–0.2)

## 2023-03-12 LAB — BASIC METABOLIC PANEL
Anion gap: 7 (ref 5–15)
BUN: 20 mg/dL (ref 8–23)
CO2: 28 mmol/L (ref 22–32)
Calcium: 9.5 mg/dL (ref 8.9–10.3)
Chloride: 97 mmol/L — ABNORMAL LOW (ref 98–111)
Creatinine, Ser: 0.66 mg/dL (ref 0.44–1.00)
GFR, Estimated: >60 mL/min (ref >60)
Glucose, Bld: 489 mg/dL — ABNORMAL HIGH (ref 70–99)
Potassium: 4.7 mmol/L (ref 3.5–5.1)
Sodium: 132 mmol/L — ABNORMAL LOW (ref 135–145)

## 2023-03-12 MED ORDER — SULFAMETHOXAZOLE-TRIMETHOPRIM 800-160 MG PO TABS: 1.0000 | ORAL_TABLET | Freq: Two times a day (BID) | ORAL | 0 refills | Status: DC

## 2023-03-12 MED ORDER — CEFAZOLIN SODIUM-DEXTROSE 1-4 GM/50ML-% IV SOLN
1.0000 g | Freq: Once | INTRAVENOUS | Status: AC
  Administered 2023-03-12: 1 g via INTRAVENOUS
  Filled 2023-03-12: qty 50

## 2023-03-12 MED ORDER — SODIUM CHLORIDE 0.9 % IV BOLUS
1000.0000 mL | Freq: Once | INTRAVENOUS | Status: AC
  Administered 2023-03-12: 1000 mL via INTRAVENOUS

## 2023-03-12 MED ORDER — IBUPROFEN 400 MG PO TABS
600.0000 mg | ORAL_TABLET | Freq: Once | ORAL | Status: AC
  Administered 2023-03-12: 600 mg via ORAL
  Filled 2023-03-12: qty 1

## 2023-03-12 MED ORDER — SODIUM CHLORIDE 0.9 % IV SOLN: INTRAVENOUS | Status: DC | PRN

## 2023-03-12 MED ORDER — INSULIN ASPART 100 UNIT/ML IJ SOLN
10.0000 [IU] | Freq: Once | INTRAMUSCULAR | Status: AC
  Administered 2023-03-12: 10 [IU] via SUBCUTANEOUS

## 2023-03-12 NOTE — ED Provider Notes (Signed)
[place] EMERGENCY DEPARTMENT AT [facility] Provider Note   CSN: [id number] Arrival date & time: 03/12/23  4098     History  Chief Complaint  Patient presents with   Foot Swelling    [name] is a 72 y.o. female with medical history of diabetes type 1, hypertension, osteoporosis, schizophrenia.  Patient presents to ED for evaluation of right foot swelling.  Patient reports that her right foot began swelling last night after she bumped into the wall.  On exam, the patient right foot is extremely erythematous throughout.  The patient right foot also appears to be a diabetic foot wound to the bottom portion of her foot between the web spacing of her great toe and second toe.  Patient reports that she did not notice this until last night.  The patient states she has not had a diabetic foot exam in over 2 years.  The patient denies fevers, body aches or chills, nausea, vomiting.    HPI     Home Medications Prior to Admission medications   Medication Sig Start Date End Date Taking? Authorizing Provider  sulfamethoxazole-trimethoprim (BACTRIM DS) 800-160 MG tablet Take 1 tablet by mouth 2 (two) times daily for 10 days. 03/12/23 03/22/23 Yes Al Decant, PA-C  albuterol (VENTOLIN HFA) 108 (90 Base) MCG/ACT inhaler TAKE 2 PUFFS BY MOUTH EVERY 6 HOURS AS NEEDED FOR WHEEZE OR SHORTNESS OF BREATH Patient not taking: Reported on 02/26/2023 12/03/22   Ardith Dark, MD  ARIPiprazole (ABILIFY) 30 MG tablet Take 1 tablet (30 mg total) by mouth at bedtime. 03/02/23   Meredeth Ide, MD  benztropine (COGENTIN) 1 MG tablet Take 1 tablet (1 mg total) by mouth at bedtime. 03/02/23   Meredeth Ide, MD  buPROPion (WELLBUTRIN) 75 MG tablet Take 1 tablet (75 mg total) by mouth 2 (two) times daily. 03/02/23   Meredeth Ide, MD  Continuous Blood Gluc Receiver (DEXCOM G6 RECEIVER) DEVI Use as directed for continuous glucose monitoring. 05/09/21   [provider]  Continuous Blood  Gluc Sensor (FREESTYLE LIBRE 2 SENSOR) MISC 1 each by Does not apply route every 14 (fourteen) days. 01/14/23   Ardith Dark, MD  haloperidol (HALDOL) 1 MG tablet Take 1 tablet (1 mg total) by mouth daily. 03/03/23   Meredeth Ide, MD  insulin aspart protamine- aspart (NOVOLOG MIX 70/30) (70-30) 100 UNIT/ML injection Inject 0.25 mLs (25 Units total) into the skin 2 (two) times daily with a meal. 03/02/23   Meredeth Ide, MD  Insulin Pen Needle 32G X 4 MM MISC Use with Insulin pen 03/02/23   Meredeth Ide, MD  Insulin Syringe-Needle U-100 (INSULIN SYRINGE .5CC/31GX5/16") 31G X 5/16" 0.5 ML MISC Use as directed 03/02/23   Meredeth Ide, MD  metoprolol succinate (TOPROL-XL) 25 MG 24 hr tablet Take 0.5 tablets (12.5 mg total) by mouth daily. Patient not taking: Reported on 02/26/2023 01/09/22   Janeece Agee, NP  OYSCO 500 500 MG TABS Take 1 tablet (1,250 mg total) by mouth daily. Patient not taking: Reported on 02/26/2023 01/03/22   Janeece Agee, NP      Allergies    Penicillins    Review of Systems   Review of Systems  Constitutional:  Negative for chills and fever.  Cardiovascular:  Positive for leg swelling.  Musculoskeletal:  Negative for myalgias.  Skin:  Positive for color change and wound.  All other systems reviewed and are negative.   Physical Exam Updated Vital Signs  BP (!) 145/52   Pulse 75   Temp 98.1 F (36.7 C) (Oral)   Resp 18   Ht 5\' 5"  (1.651 m)   Wt 74.8 kg   SpO2 98%   BMI 27.46 kg/m  Physical Exam Vitals and nursing note reviewed.  Constitutional:      General: She is not in acute distress.    Appearance: She is not ill-appearing, toxic-appearing or diaphoretic.  HENT:     Head: Normocephalic and atraumatic.     Nose: Nose normal.     Mouth/Throat:     Mouth: Mucous membranes are moist.     Pharynx: Oropharynx is clear.  Eyes:     Extraocular Movements: Extraocular movements intact.     Conjunctiva/sclera: Conjunctivae normal.     Pupils: Pupils are  equal, round, and reactive to light.  Cardiovascular:     Rate and Rhythm: Normal rate and regular rhythm.  Pulmonary:     Effort: Pulmonary effort is normal.     Breath sounds: Normal breath sounds. No wheezing.  Abdominal:     General: Abdomen is flat. Bowel sounds are normal.     Palpations: Abdomen is soft.     Tenderness: There is no abdominal tenderness.  Musculoskeletal:       Feet:  Feet:     Right foot:     Skin integrity: Erythema present.     Toenail Condition: Fungal disease present.    Left foot:     Toenail Condition: Fungal disease present.    Comments: 4 x 2 cm diabetic foot wound to volar surface of right foot between webbing of great toe, second toe.  Erythematous soft tissue swelling to dorsal surface of right foot. Skin:    Capillary Refill: Capillary refill takes less than 2 seconds.  Neurological:     Mental Status: She is alert and oriented to person, place, and time.         ED Results / Procedures / Treatments   Labs (all labs ordered are listed, but only abnormal results are displayed) Labs Reviewed  CBC - Abnormal; Notable for the following components:      Result Value   Hemoglobin 10.6 (*)    HCT 34.0 (*)    RDW 15.7 (*)    All other components within normal limits  BASIC METABOLIC PANEL - Abnormal; Notable for the following components:   Sodium 132 (*)    Chloride 97 (*)    Glucose, Bld 489 (*)    All other components within normal limits  CBG MONITORING, ED - Abnormal; Notable for the following components:   Glucose-Capillary 281 (*)    All other components within normal limits    EKG None  Radiology DG Foot Complete Right  Result Date: 03/12/2023 CLINICAL DATA:  Cellulitis throughout foot, diabetic foot wound on the base of the big toe and 2nd digit. Foot red and swollen EXAM: RIGHT FOOT COMPLETE - 3+ VIEW COMPARISON:  None available FINDINGS: No fracture or dislocation. Diffuse osteopenia.  No focal erosions to indicate  osteomyelitis. Mild swelling of the dorsal forefoot soft tissues. IMPRESSION: 1. Mild swelling of the dorsal forefoot soft tissues. No focal erosions to indicate osteomyelitis. 2. Diffuse osteopenia. Electronically Signed   By: Acquanetta Belling M.D.   On: 03/12/2023 11:10    Procedures Procedures   Medications Ordered in ED Medications  0.9 %  sodium chloride infusion ( Intravenous New Bag/Given 03/12/23 1203)  ibuprofen (ADVIL) tablet 600 mg (600 mg Oral  Given 03/12/23 1021)  sodium chloride 0.9 % bolus 1,000 mL (0 mLs Intravenous Stopped 03/12/23 1326)  insulin aspart (novoLOG) injection 10 Units (10 Units Subcutaneous Given 03/12/23 1217)  ceFAZolin (ANCEF) IVPB 1 g/50 mL premix (0 g Intravenous Stopped 03/12/23 1326)    ED Course/ Medical Decision Making/ A&P  Medical Decision Making  72 year old female presents to the ED for evaluation.  Please see HPI for further details.  On examination the patient is afebrile and nontachycardic.  Lung sounds are clear bilaterally, not hypoxic.  Abdomen soft and compressible throughout.  Oriented x 3.  Patient does have 4 x 2 cm diabetic foot wound to volar surface of right foot between webbing of great toe on the second toe.  The patient also has erythema and soft tissue swelling to the dorsal surface of her right foot.   CBC without leukocytosis, baseline hemoglobin.  Patient BMP with sodium 132, glucose 49.  Sodium ice likely pseudohyponatremia secondary to elevated glucose.  Patient reports that she has not been compliant on insulin.  Patient provided 10 units here as well as 1 L fluid.  No anion gap elevation, 7.  Plain film Imaging of patient right foot shows no evidence of osteomyelitis however there is evidence of soft tissue swelling.  Patient was likely has cellulitis to dorsal surface of foot.  Patient also has diabetic foot wound to volar surface of foot.  Patient given 1 g Ancef for foot wound.  Patient given 600 mg of ibuprofen for pain.  At  this time the patient will be discharged and referred to wound care.  The patient will be sent home on 10 days of Bactrim which she will take twice daily for cellulitis to her foot.  Patient was given return precautions and she voiced understanding.  Patient had all of her questions answered to her satisfaction.  Patient had foot wound wrapped prior to discharge and she was provided new socks that were clean.  Patient stable for discharge.  Final Clinical Impression(s) / ED Diagnoses Final diagnoses:  Diabetic ulcer of right foot associated with type 1 diabetes mellitus, unspecified part of foot, unspecified ulcer stage  Cellulitis of toe of right foot    Rx / DC Orders ED Discharge Orders          Ordered    AMB referral to wound care center        03/12/23 1401    sulfamethoxazole-trimethoprim (BACTRIM DS) 800-160 MG tablet  2 times daily        03/12/23 1401              Al Decant, [place] 03/12/23 1405    Horton, Clabe Seal, DO 03/13/23 0815

## 2023-03-12 NOTE — ED Notes (Signed)
Patient gave verbal agreement of MSE.

## 2023-03-12 NOTE — Telephone Encounter (Signed)
Ok with me. Please place any necessary orders. 

## 2023-03-12 NOTE — Telephone Encounter (Signed)
Spoke with [facility] [phone] -RN VO given  1x per week every other week for 8 weeks, then PRN

## 2023-03-12 NOTE — Telephone Encounter (Signed)
Ok to give VO? 

## 2023-03-12 NOTE — ED Triage Notes (Signed)
Onset last night of foot pain.  This am foot swollen and redden.  Ambulatory slowly to room

## 2023-03-12 NOTE — ED Notes (Signed)
Wound to bottom of foot dressed with bacterin ointment ; 2x2 and wrapped with Kerlix

## 2023-03-12 NOTE — ED Notes (Signed)
Noted wound to bottom of right foot.  Whole foot swollen with redden area including toes and base of toes.

## 2023-03-12 NOTE — Discharge Instructions (Addendum)
Return to the ED with any new or worsening signs or symptoms Please follow-up with your PCP for reevaluation of the swelling and redness to the top of your foot.  Please also be on the look out for a call from the wound care center.  You will need to keep.  Diabetic foot wound wrapped in clean until seen by wound care.  Please apply new socks each morning upon waking. You will begin taking antibiotics, Bactrim, twice daily for the next 10 days.  You will start this medication today.

## 2023-03-13 ENCOUNTER — Encounter (HOSPITAL_COMMUNITY): Payer: Self-pay | Admitting: Emergency Medicine

## 2023-03-13 ENCOUNTER — Inpatient Hospital Stay (HOSPITAL_COMMUNITY)
Admission: EM | Admit: 2023-03-13 | Discharge: 2023-03-21 | DRG: 871 | Disposition: A | Discharge status: Skilled Nursing Facility | Payer: Medicare Other | Attending: Internal Medicine | Admitting: Internal Medicine

## 2023-03-13 ENCOUNTER — Other Ambulatory Visit: Payer: Self-pay

## 2023-03-13 ENCOUNTER — Emergency Department (HOSPITAL_COMMUNITY): Payer: Medicare Other

## 2023-03-13 DIAGNOSIS — L97511 Non-pressure chronic ulcer of other part of right foot limited to breakdown of skin: Secondary | ICD-10-CM | POA: Diagnosis present

## 2023-03-13 DIAGNOSIS — Z1611 Resistance to penicillins: Secondary | ICD-10-CM | POA: Diagnosis present

## 2023-03-13 DIAGNOSIS — L03115 Cellulitis of right lower limb: Secondary | ICD-10-CM | POA: Diagnosis present

## 2023-03-13 DIAGNOSIS — Z66 Do not resuscitate: Secondary | ICD-10-CM | POA: Diagnosis present

## 2023-03-13 DIAGNOSIS — Z8249 Family history of ischemic heart disease and other diseases of the circulatory system: Secondary | ICD-10-CM

## 2023-03-13 DIAGNOSIS — Z825 Family history of asthma and other chronic lower respiratory diseases: Secondary | ICD-10-CM

## 2023-03-13 DIAGNOSIS — R509 Fever, unspecified: Secondary | ICD-10-CM | POA: Insufficient documentation

## 2023-03-13 DIAGNOSIS — Z1619 Resistance to other specified beta lactam antibiotics: Secondary | ICD-10-CM | POA: Diagnosis present

## 2023-03-13 DIAGNOSIS — E162 Hypoglycemia, unspecified: Secondary | ICD-10-CM

## 2023-03-13 DIAGNOSIS — J9601 Acute respiratory failure with hypoxia: Secondary | ICD-10-CM | POA: Diagnosis present

## 2023-03-13 DIAGNOSIS — Z794 Long term (current) use of insulin: Secondary | ICD-10-CM

## 2023-03-13 DIAGNOSIS — Z823 Family history of stroke: Secondary | ICD-10-CM

## 2023-03-13 DIAGNOSIS — E8770 Fluid overload, unspecified: Secondary | ICD-10-CM | POA: Diagnosis not present

## 2023-03-13 DIAGNOSIS — Z7982 Long term (current) use of aspirin: Secondary | ICD-10-CM

## 2023-03-13 DIAGNOSIS — E1022 Type 1 diabetes mellitus with diabetic chronic kidney disease: Secondary | ICD-10-CM

## 2023-03-13 DIAGNOSIS — A419 Sepsis, unspecified organism: Principal | ICD-10-CM | POA: Diagnosis present

## 2023-03-13 DIAGNOSIS — Z8673 Personal history of transient ischemic attack (TIA), and cerebral infarction without residual deficits: Secondary | ICD-10-CM

## 2023-03-13 DIAGNOSIS — N390 Urinary tract infection, site not specified: Secondary | ICD-10-CM | POA: Insufficient documentation

## 2023-03-13 DIAGNOSIS — B961 Klebsiella pneumoniae [K. pneumoniae] as the cause of diseases classified elsewhere: Secondary | ICD-10-CM | POA: Diagnosis present

## 2023-03-13 DIAGNOSIS — E10649 Type 1 diabetes mellitus with hypoglycemia without coma: Secondary | ICD-10-CM | POA: Diagnosis present

## 2023-03-13 DIAGNOSIS — N3946 Mixed incontinence: Secondary | ICD-10-CM | POA: Diagnosis present

## 2023-03-13 DIAGNOSIS — F259 Schizoaffective disorder, unspecified: Secondary | ICD-10-CM | POA: Diagnosis present

## 2023-03-13 DIAGNOSIS — D638 Anemia in other chronic diseases classified elsewhere: Secondary | ICD-10-CM | POA: Diagnosis present

## 2023-03-13 DIAGNOSIS — E1169 Type 2 diabetes mellitus with other specified complication: Secondary | ICD-10-CM | POA: Diagnosis present

## 2023-03-13 DIAGNOSIS — E872 Acidosis, unspecified: Secondary | ICD-10-CM | POA: Diagnosis present

## 2023-03-13 DIAGNOSIS — Z87891 Personal history of nicotine dependence: Secondary | ICD-10-CM

## 2023-03-13 DIAGNOSIS — M810 Age-related osteoporosis without current pathological fracture: Secondary | ICD-10-CM | POA: Diagnosis present

## 2023-03-13 DIAGNOSIS — E11621 Type 2 diabetes mellitus with foot ulcer: Secondary | ICD-10-CM

## 2023-03-13 DIAGNOSIS — Z833 Family history of diabetes mellitus: Secondary | ICD-10-CM

## 2023-03-13 DIAGNOSIS — Z79899 Other long term (current) drug therapy: Secondary | ICD-10-CM

## 2023-03-13 DIAGNOSIS — F39 Unspecified mood [affective] disorder: Secondary | ICD-10-CM | POA: Diagnosis present

## 2023-03-13 DIAGNOSIS — D649 Anemia, unspecified: Secondary | ICD-10-CM | POA: Insufficient documentation

## 2023-03-13 DIAGNOSIS — Z880 Allergy status to penicillin: Secondary | ICD-10-CM

## 2023-03-13 DIAGNOSIS — T68XXXA Hypothermia, initial encounter: Secondary | ICD-10-CM

## 2023-03-13 DIAGNOSIS — J189 Pneumonia, unspecified organism: Secondary | ICD-10-CM | POA: Diagnosis present

## 2023-03-13 DIAGNOSIS — Z1612 Extended spectrum beta lactamase (ESBL) resistance: Secondary | ICD-10-CM | POA: Diagnosis present

## 2023-03-13 DIAGNOSIS — E785 Hyperlipidemia, unspecified: Secondary | ICD-10-CM | POA: Diagnosis present

## 2023-03-13 DIAGNOSIS — E1069 Type 1 diabetes mellitus with other specified complication: Secondary | ICD-10-CM | POA: Diagnosis present

## 2023-03-13 DIAGNOSIS — E10621 Type 1 diabetes mellitus with foot ulcer: Principal | ICD-10-CM

## 2023-03-13 DIAGNOSIS — I10 Essential (primary) hypertension: Secondary | ICD-10-CM | POA: Diagnosis present

## 2023-03-13 DIAGNOSIS — J811 Chronic pulmonary edema: Secondary | ICD-10-CM | POA: Insufficient documentation

## 2023-03-13 DIAGNOSIS — Z1152 Encounter for screening for COVID-19: Secondary | ICD-10-CM

## 2023-03-13 LAB — URINALYSIS, W/ REFLEX TO CULTURE (INFECTION SUSPECTED)
Bilirubin Urine: NEGATIVE
Glucose, UA: 150 mg/dL — AB
Ketones, ur: NEGATIVE mg/dL
Nitrite: POSITIVE — AB
Protein, ur: 100 mg/dL — AB
Specific Gravity, Urine: 1.016 (ref 1.005–1.030)
pH: 5 (ref 5.0–8.0)

## 2023-03-13 LAB — CBC WITH DIFFERENTIAL/PLATELET
Abs Immature Granulocytes: 0.01 10*3/uL (ref 0.00–0.07)
Basophils Absolute: 0 10*3/uL (ref 0.0–0.1)
Basophils Relative: 1 %
Eosinophils Absolute: 0 10*3/uL (ref 0.0–0.5)
Eosinophils Relative: 0 %
HCT: 39.4 % (ref 36.0–46.0)
Hemoglobin: 12.2 g/dL (ref 12.0–15.0)
Immature Granulocytes: 0 %
Lymphocytes Relative: 13 %
Lymphs Abs: 0.7 10*3/uL (ref 0.7–4.0)
MCH: 26.5 pg (ref 26.0–34.0)
MCHC: 31 g/dL (ref 30.0–36.0)
MCV: 85.7 fL (ref 80.0–100.0)
Monocytes Absolute: 0.2 10*3/uL (ref 0.1–1.0)
Monocytes Relative: 3 %
Neutro Abs: 4.4 10*3/uL (ref 1.7–7.7)
Neutrophils Relative %: 83 %
Platelets: 241 10*3/uL (ref 150–400)
RBC: 4.6 MIL/uL (ref 3.87–5.11)
RDW: 15.4 % (ref 11.5–15.5)
WBC: 5.3 10*3/uL (ref 4.0–10.5)
nRBC: 0 % (ref 0.0–0.2)

## 2023-03-13 LAB — COMPREHENSIVE METABOLIC PANEL
ALT: 15 U/L (ref 0–44)
AST: 22 U/L (ref 15–41)
Albumin: 3.7 g/dL (ref 3.5–5.0)
Alkaline Phosphatase: 107 U/L (ref 38–126)
Anion gap: 12 (ref 5–15)
BUN: 22 mg/dL (ref 8–23)
CO2: 24 mmol/L (ref 22–32)
Calcium: 9 mg/dL (ref 8.9–10.3)
Chloride: 100 mmol/L (ref 98–111)
Creatinine, Ser: 0.58 mg/dL (ref 0.44–1.00)
GFR, Estimated: >60 mL/min (ref >60)
Glucose, Bld: 110 mg/dL — ABNORMAL HIGH (ref 70–99)
Potassium: 4 mmol/L (ref 3.5–5.1)
Sodium: 136 mmol/L (ref 135–145)
Total Bilirubin: 0.4 mg/dL (ref 0.3–1.2)
Total Protein: 8.3 g/dL — ABNORMAL HIGH (ref 6.5–8.1)

## 2023-03-13 LAB — CBG MONITORING, ED: Glucose-Capillary: 150 mg/dL — ABNORMAL HIGH (ref 70–99)

## 2023-03-13 LAB — LACTIC ACID, PLASMA: Lactic Acid, Venous: 2.3 mmol/L (ref 0.5–1.9)

## 2023-03-13 MED ORDER — LACTATED RINGERS IV BOLUS
1500.0000 mL | Freq: Once | INTRAVENOUS | Status: AC
  Administered 2023-03-13: 1500 mL via INTRAVENOUS

## 2023-03-13 MED ORDER — MAGNESIUM SULFATE 2 GM/50ML IV SOLN: 2.0000 g | Freq: Once | INTRAVENOUS | Status: DC

## 2023-03-13 MED ORDER — AZITHROMYCIN 250 MG PO TABS: 250.0000 mg | ORAL_TABLET | Freq: Every day | ORAL | 0 refills | Status: DC

## 2023-03-13 MED ORDER — CEFPODOXIME PROXETIL 200 MG PO TABS: 200.0000 mg | ORAL_TABLET | Freq: Two times a day (BID) | ORAL | 0 refills | Status: DC

## 2023-03-13 MED ORDER — POTASSIUM CHLORIDE 20 MEQ PO PACK: 60.0000 meq | PACK | Freq: Once | ORAL | Status: DC

## 2023-03-13 MED ORDER — POTASSIUM CHLORIDE 10 MEQ/100ML IV SOLN: 10.0000 meq | INTRAVENOUS | Status: DC

## 2023-03-13 NOTE — ED Notes (Signed)
Patient wet and soiled clothing has been removed. Patient arrived with a heavily soiled diaper. That has also been removed. Patient was cleaned and hospital gown was placed on the patient. Patient is currently resting in bed. [employer] is in use.

## 2023-03-13 NOTE — ED Notes (Signed)
Patient was given something to eat.

## 2023-03-13 NOTE — ED Triage Notes (Signed)
Per family pt had been unresponsive x 2 hours before calling EMS. Pt smells strongly of urine. Pt blood was 43 and she was agonal snoring sounds.

## 2023-03-14 ENCOUNTER — Emergency Department (HOSPITAL_COMMUNITY): Payer: Medicare Other

## 2023-03-14 ENCOUNTER — Inpatient Hospital Stay (HOSPITAL_COMMUNITY): Payer: Medicare Other

## 2023-03-14 DIAGNOSIS — J9601 Acute respiratory failure with hypoxia: Secondary | ICD-10-CM | POA: Insufficient documentation

## 2023-03-14 DIAGNOSIS — Z1152 Encounter for screening for COVID-19: Secondary | ICD-10-CM | POA: Diagnosis not present

## 2023-03-14 DIAGNOSIS — Z833 Family history of diabetes mellitus: Secondary | ICD-10-CM | POA: Diagnosis not present

## 2023-03-14 DIAGNOSIS — N390 Urinary tract infection, site not specified: Secondary | ICD-10-CM | POA: Diagnosis present

## 2023-03-14 DIAGNOSIS — R6520 Severe sepsis without septic shock: Secondary | ICD-10-CM | POA: Diagnosis not present

## 2023-03-14 DIAGNOSIS — E10649 Type 1 diabetes mellitus with hypoglycemia without coma: Secondary | ICD-10-CM | POA: Diagnosis present

## 2023-03-14 DIAGNOSIS — Z794 Long term (current) use of insulin: Secondary | ICD-10-CM | POA: Diagnosis not present

## 2023-03-14 DIAGNOSIS — L97511 Non-pressure chronic ulcer of other part of right foot limited to breakdown of skin: Secondary | ICD-10-CM | POA: Diagnosis present

## 2023-03-14 DIAGNOSIS — E1169 Type 2 diabetes mellitus with other specified complication: Secondary | ICD-10-CM

## 2023-03-14 DIAGNOSIS — I10 Essential (primary) hypertension: Secondary | ICD-10-CM | POA: Diagnosis present

## 2023-03-14 DIAGNOSIS — Z79899 Other long term (current) drug therapy: Secondary | ICD-10-CM | POA: Diagnosis not present

## 2023-03-14 DIAGNOSIS — Z1611 Resistance to penicillins: Secondary | ICD-10-CM | POA: Diagnosis present

## 2023-03-14 DIAGNOSIS — L03115 Cellulitis of right lower limb: Secondary | ICD-10-CM | POA: Diagnosis present

## 2023-03-14 DIAGNOSIS — E11621 Type 2 diabetes mellitus with foot ulcer: Secondary | ICD-10-CM | POA: Insufficient documentation

## 2023-03-14 DIAGNOSIS — J189 Pneumonia, unspecified organism: Secondary | ICD-10-CM | POA: Diagnosis not present

## 2023-03-14 DIAGNOSIS — Z1619 Resistance to other specified beta lactam antibiotics: Secondary | ICD-10-CM | POA: Diagnosis present

## 2023-03-14 DIAGNOSIS — E1069 Type 1 diabetes mellitus with other specified complication: Secondary | ICD-10-CM | POA: Diagnosis present

## 2023-03-14 DIAGNOSIS — N3000 Acute cystitis without hematuria: Secondary | ICD-10-CM

## 2023-03-14 DIAGNOSIS — E785 Hyperlipidemia, unspecified: Secondary | ICD-10-CM

## 2023-03-14 DIAGNOSIS — F259 Schizoaffective disorder, unspecified: Secondary | ICD-10-CM | POA: Diagnosis present

## 2023-03-14 DIAGNOSIS — E872 Acidosis, unspecified: Secondary | ICD-10-CM | POA: Diagnosis present

## 2023-03-14 DIAGNOSIS — Z8249 Family history of ischemic heart disease and other diseases of the circulatory system: Secondary | ICD-10-CM | POA: Diagnosis not present

## 2023-03-14 DIAGNOSIS — E162 Hypoglycemia, unspecified: Secondary | ICD-10-CM | POA: Insufficient documentation

## 2023-03-14 DIAGNOSIS — Z66 Do not resuscitate: Secondary | ICD-10-CM | POA: Diagnosis present

## 2023-03-14 DIAGNOSIS — E10621 Type 1 diabetes mellitus with foot ulcer: Secondary | ICD-10-CM | POA: Diagnosis present

## 2023-03-14 DIAGNOSIS — L97519 Non-pressure chronic ulcer of other part of right foot with unspecified severity: Secondary | ICD-10-CM

## 2023-03-14 DIAGNOSIS — A419 Sepsis, unspecified organism: Secondary | ICD-10-CM | POA: Diagnosis not present

## 2023-03-14 DIAGNOSIS — D638 Anemia in other chronic diseases classified elsewhere: Secondary | ICD-10-CM | POA: Diagnosis present

## 2023-03-14 DIAGNOSIS — Z87891 Personal history of nicotine dependence: Secondary | ICD-10-CM | POA: Diagnosis not present

## 2023-03-14 DIAGNOSIS — Z1612 Extended spectrum beta lactamase (ESBL) resistance: Secondary | ICD-10-CM | POA: Diagnosis present

## 2023-03-14 DIAGNOSIS — F39 Unspecified mood [affective] disorder: Secondary | ICD-10-CM

## 2023-03-14 DIAGNOSIS — T68XXXA Hypothermia, initial encounter: Secondary | ICD-10-CM

## 2023-03-14 DIAGNOSIS — L97518 Non-pressure chronic ulcer of other part of right foot with other specified severity: Secondary | ICD-10-CM | POA: Diagnosis not present

## 2023-03-14 DIAGNOSIS — E1022 Type 1 diabetes mellitus with diabetic chronic kidney disease: Secondary | ICD-10-CM | POA: Diagnosis not present

## 2023-03-14 LAB — COMPREHENSIVE METABOLIC PANEL
ALT: 13 U/L (ref 0–44)
AST: 15 U/L (ref 15–41)
Albumin: 2.5 g/dL — ABNORMAL LOW (ref 3.5–5.0)
Alkaline Phosphatase: 76 U/L (ref 38–126)
Anion gap: 8 (ref 5–15)
BUN: 20 mg/dL (ref 8–23)
CO2: 25 mmol/L (ref 22–32)
Calcium: 8 mg/dL — ABNORMAL LOW (ref 8.9–10.3)
Chloride: 102 mmol/L (ref 98–111)
Creatinine, Ser: 0.58 mg/dL (ref 0.44–1.00)
GFR, Estimated: >60 mL/min (ref >60)
Glucose, Bld: 228 mg/dL — ABNORMAL HIGH (ref 70–99)
Potassium: 3.7 mmol/L (ref 3.5–5.1)
Sodium: 135 mmol/L (ref 135–145)
Total Bilirubin: 0.1 mg/dL — ABNORMAL LOW (ref 0.3–1.2)
Total Protein: 5.8 g/dL — ABNORMAL LOW (ref 6.5–8.1)

## 2023-03-14 LAB — C-REACTIVE PROTEIN
CRP: 0.8 mg/dL (ref <1.0)
CRP: 1 mg/dL — ABNORMAL HIGH (ref <1.0)

## 2023-03-14 LAB — CBC WITH DIFFERENTIAL/PLATELET
Abs Immature Granulocytes: 0.02 10*3/uL (ref 0.00–0.07)
Basophils Absolute: 0 10*3/uL (ref 0.0–0.1)
Basophils Relative: 0 %
Eosinophils Absolute: 0 10*3/uL (ref 0.0–0.5)
Eosinophils Relative: 0 %
HCT: 28.2 % — ABNORMAL LOW (ref 36.0–46.0)
Hemoglobin: 9.1 g/dL — ABNORMAL LOW (ref 12.0–15.0)
Immature Granulocytes: 0 %
Lymphocytes Relative: 10 %
Lymphs Abs: 0.5 10*3/uL — ABNORMAL LOW (ref 0.7–4.0)
MCH: 26.8 pg (ref 26.0–34.0)
MCHC: 32.3 g/dL (ref 30.0–36.0)
MCV: 83.2 fL (ref 80.0–100.0)
Monocytes Absolute: 0.2 10*3/uL (ref 0.1–1.0)
Monocytes Relative: 5 %
Neutro Abs: 4 10*3/uL (ref 1.7–7.7)
Neutrophils Relative %: 85 %
Platelets: 189 10*3/uL (ref 150–400)
RBC: 3.39 MIL/uL — ABNORMAL LOW (ref 3.87–5.11)
RDW: 15.4 % (ref 11.5–15.5)
WBC: 4.7 10*3/uL (ref 4.0–10.5)
nRBC: 0 % (ref 0.0–0.2)

## 2023-03-14 LAB — CBG MONITORING, ED
Glucose-Capillary: 155 mg/dL — ABNORMAL HIGH (ref 70–99)
Glucose-Capillary: 140 mg/dL — ABNORMAL HIGH (ref 70–99)

## 2023-03-14 LAB — LACTIC ACID, PLASMA: Lactic Acid, Venous: 1.5 mmol/L (ref 0.5–1.9)

## 2023-03-14 LAB — URINE CULTURE

## 2023-03-14 LAB — PREALBUMIN: Prealbumin: 14 mg/dL — ABNORMAL LOW (ref 18–38)

## 2023-03-14 LAB — SEDIMENTATION RATE
Sed Rate: 60 mm/hr — ABNORMAL HIGH (ref 0–22)
Sed Rate: 55 mm/hr — ABNORMAL HIGH (ref 0–22)

## 2023-03-14 LAB — PROCALCITONIN: Procalcitonin: <0.1 ng/mL

## 2023-03-14 LAB — TYPE AND SCREEN
ABO/RH(D): B POS
Antibody Screen: NEGATIVE

## 2023-03-14 LAB — CORTISOL-AM, BLOOD: Cortisol - AM: 13.6 ug/dL (ref 6.7–22.6)

## 2023-03-14 LAB — STREP PNEUMONIAE URINARY ANTIGEN: Strep Pneumo Urinary Antigen: NEGATIVE

## 2023-03-14 LAB — GLUCOSE, CAPILLARY
Glucose-Capillary: 213 mg/dL — ABNORMAL HIGH (ref 70–99)
Glucose-Capillary: 143 mg/dL — ABNORMAL HIGH (ref 70–99)

## 2023-03-14 LAB — MAGNESIUM: Magnesium: 1.8 mg/dL (ref 1.7–2.4)

## 2023-03-14 LAB — PROTIME-INR
INR: 1 (ref 0.8–1.2)
Prothrombin Time: 13 seconds (ref 11.4–15.2)

## 2023-03-14 LAB — SARS CORONAVIRUS 2 BY RT PCR: SARS Coronavirus 2 by RT PCR: NEGATIVE

## 2023-03-14 MED ORDER — ASPIRIN 81 MG PO TBEC
81.0000 mg | DELAYED_RELEASE_TABLET | Freq: Every day | ORAL | Status: DC
  Administered 2023-03-14 – 2023-03-21 (×8): 81 mg via ORAL
  Filled 2023-03-14 (×8): qty 1

## 2023-03-14 MED ORDER — ACETAMINOPHEN 325 MG PO TABS
650.0000 mg | ORAL_TABLET | Freq: Four times a day (QID) | ORAL | Status: DC | PRN
  Administered 2023-03-14 – 2023-03-17 (×4): 650 mg via ORAL
  Filled 2023-03-14 (×4): qty 2

## 2023-03-14 MED ORDER — INSULIN ASPART 100 UNIT/ML IJ SOLN
0.0000 [IU] | Freq: Three times a day (TID) | INTRAMUSCULAR | Status: DC
  Administered 2023-03-14 – 2023-03-15 (×3): 2 [IU] via SUBCUTANEOUS
  Administered 2023-03-15: 3 [IU] via SUBCUTANEOUS
  Administered 2023-03-16: 4 [IU] via SUBCUTANEOUS
  Administered 2023-03-16: 3 [IU] via SUBCUTANEOUS
  Administered 2023-03-16: 4 [IU] via SUBCUTANEOUS
  Administered 2023-03-17: 5 [IU] via SUBCUTANEOUS

## 2023-03-14 MED ORDER — ARIPIPRAZOLE 10 MG PO TABS
30.0000 mg | ORAL_TABLET | Freq: Every day | ORAL | Status: DC
  Administered 2023-03-14 – 2023-03-18 (×5): 30 mg via ORAL
  Filled 2023-03-14 (×5): qty 3

## 2023-03-14 MED ORDER — ONDANSETRON HCL 4 MG/2ML IJ SOLN: 4.0000 mg | Freq: Four times a day (QID) | INTRAMUSCULAR | Status: DC | PRN

## 2023-03-14 MED ORDER — ALBUTEROL SULFATE (2.5 MG/3ML) 0.083% IN NEBU: 2.5000 mg | INHALATION_SOLUTION | RESPIRATORY_TRACT | Status: DC | PRN

## 2023-03-14 MED ORDER — BENZTROPINE MESYLATE 1 MG PO TABS
1.0000 mg | ORAL_TABLET | Freq: Every day | ORAL | Status: DC
  Administered 2023-03-14 – 2023-03-20 (×7): 1 mg via ORAL
  Filled 2023-03-14 (×7): qty 1

## 2023-03-14 MED ORDER — GADOBUTROL 1 MMOL/ML IV SOLN
7.0000 mL | Freq: Once | INTRAVENOUS | Status: AC | PRN
  Administered 2023-03-14: 7 mL via INTRAVENOUS

## 2023-03-14 MED ORDER — ADULT MULTIVITAMIN W/MINERALS CH
1.0000 | ORAL_TABLET | Freq: Every day | ORAL | Status: DC
  Administered 2023-03-14 – 2023-03-21 (×8): 1 via ORAL
  Filled 2023-03-14 (×8): qty 1

## 2023-03-14 MED ORDER — HALOPERIDOL 0.5 MG PO TABS
1.0000 mg | ORAL_TABLET | Freq: Every day | ORAL | Status: DC
  Administered 2023-03-14 – 2023-03-19 (×6): 1 mg via ORAL
  Filled 2023-03-14 (×6): qty 2

## 2023-03-14 MED ORDER — ONDANSETRON HCL 4 MG PO TABS: 4.0000 mg | ORAL_TABLET | Freq: Four times a day (QID) | ORAL | Status: DC | PRN

## 2023-03-14 MED ORDER — VANCOMYCIN HCL 1250 MG/250ML IV SOLN
1250.0000 mg | INTRAVENOUS | Status: DC
  Administered 2023-03-15 – 2023-03-16 (×2): 1250 mg via INTRAVENOUS
  Filled 2023-03-14 (×2): qty 250

## 2023-03-14 MED ORDER — SODIUM CHLORIDE 0.9 % IV SOLN
2.0000 g | Freq: Once | INTRAVENOUS | Status: AC
  Administered 2023-03-14: 2 g via INTRAVENOUS
  Filled 2023-03-14: qty 20

## 2023-03-14 MED ORDER — ACETAMINOPHEN 650 MG RE SUPP: 650.0000 mg | Freq: Four times a day (QID) | RECTAL | Status: DC | PRN

## 2023-03-14 MED ORDER — VANCOMYCIN HCL 1500 MG/300ML IV SOLN
1500.0000 mg | Freq: Once | INTRAVENOUS | Status: AC
  Administered 2023-03-14: 1500 mg via INTRAVENOUS
  Filled 2023-03-14: qty 300

## 2023-03-14 MED ORDER — OXYCODONE HCL 5 MG PO TABS
5.0000 mg | ORAL_TABLET | ORAL | Status: DC | PRN
  Administered 2023-03-14 – 2023-03-19 (×5): 5 mg via ORAL
  Filled 2023-03-14 (×7): qty 1

## 2023-03-14 MED ORDER — BUPROPION HCL 75 MG PO TABS
75.0000 mg | ORAL_TABLET | Freq: Two times a day (BID) | ORAL | Status: DC
  Administered 2023-03-14 – 2023-03-21 (×15): 75 mg via ORAL
  Filled 2023-03-14 (×19): qty 1

## 2023-03-14 MED ORDER — HEPARIN SODIUM (PORCINE) 5000 UNIT/ML IJ SOLN
5000.0000 [IU] | Freq: Three times a day (TID) | INTRAMUSCULAR | Status: DC
  Administered 2023-03-14 – 2023-03-21 (×23): 5000 [IU] via SUBCUTANEOUS
  Filled 2023-03-14 (×23): qty 1

## 2023-03-14 MED ORDER — METRONIDAZOLE 500 MG/100ML IV SOLN
500.0000 mg | Freq: Once | INTRAVENOUS | Status: AC
  Administered 2023-03-14: 500 mg via INTRAVENOUS
  Filled 2023-03-14: qty 100

## 2023-03-14 MED ORDER — INSULIN ASPART 100 UNIT/ML IJ SOLN
0.0000 [IU] | Freq: Every day | INTRAMUSCULAR | Status: DC
  Administered 2023-03-16: 5 [IU] via SUBCUTANEOUS

## 2023-03-14 MED ORDER — SODIUM CHLORIDE 0.9 % IV SOLN
2.0000 g | Freq: Three times a day (TID) | INTRAVENOUS | Status: DC
  Administered 2023-03-14 – 2023-03-16 (×7): 2 g via INTRAVENOUS
  Filled 2023-03-14 (×7): qty 12.5

## 2023-03-14 NOTE — Assessment & Plan Note (Addendum)
Multifocal pneumonia suggested on imaging. Empiric Vancomycin and Cefepime started on admission. Blood cultures and strep pneumo/legionella ur antigen testing ordered. Blood cultures with no growth to date. -Continue Vancomycin/Cefepime for now -Follow-up blood cultures -Follow-up sputum culture

## 2023-03-14 NOTE — Assessment & Plan Note (Addendum)
Secondary to pneumonia -Wean to room air as able.

## 2023-03-14 NOTE — Assessment & Plan Note (Signed)
In setting of sepsis, but more likely related to hypoglycemia. Patient improved with the use of an active heated air blanket. Resolved.

## 2023-03-14 NOTE — Progress Notes (Signed)
Initial Nutrition Assessment  DOCUMENTATION CODES:   Not applicable  INTERVENTION:  - Add CHO restriction.   - Add MVI q day.   NUTRITION DIAGNOSIS:   Altered nutrition lab value related to chronic illness as evidenced by other (comment) (FS BG 150-281 mg/dL. HgA1c 14.1%).  GOAL:   Patient will meet greater than or equal to 90% of their needs  MONITOR:   Labs  REASON FOR ASSESSMENT:   Consult Wound healing  ASSESSMENT:   72 y.o. female admits related to hypoglycemia. PMH includes: allergies, depression, DM, HLD, HTN, osteoporosis, pneumonia, schizophrenia, stroke. Pt is currently receiving medical management related to Sepsis.  Meds reviewed: sliding scale insulin. Labs reviewed. FS BG 150-281 mg/dL. HgA1c 14.1% (02/13/23).   RD working remotely and pt is currently in [facility] emergency dept. No significant wt loss per record. On most recent admission pt was eating 100% of her meals. Consult for wound healing. No documentation of wound in flowsheets. Per MD note, there is a malodorous ulcer between the first and second digit of right foot. RD will continue to monitor PO intakes.   NUTRITION - FOCUSED PHYSICAL EXAM:  Unable to assess, attempt at follow up.   Diet Order:   Diet Order             Diet Heart Room service appropriate? Yes; Fluid consistency: Thin  Diet effective now                   EDUCATION NEEDS:   Not appropriate for education at this time  Skin:  Skin Assessment: Reviewed RN Assessment  Last BM:  4/25 - type 4  Height:   Ht Readings from Last 1 Encounters:  03/13/23  (1.651 m)    Weight:   Wt Readings from Last 1 Encounters:  03/13/23 74.8 kg    Ideal Body Weight:     BMI:  Body mass index is 27.44 kg/m.  Estimated Nutritional Needs:   Kcal:  1610-9604 kcals  Protein:  90-110 gm  Fluid:  >/= 1.8 L  Bethann Humble, RD, LDN, CNSC.

## 2023-03-14 NOTE — Assessment & Plan Note (Addendum)
Secondary to infected diabetic foot ulcer. MRI obtained and is without evidence of underlying abscess or osteomyelitis. Cellulitis improving. -Discontinue Vancomycin and Cefepime and start meropenem for associated ESBL Klebsiella pneumoniae UTI

## 2023-03-14 NOTE — ED Notes (Signed)
Walked into room due to patient calling out. Upon entering the room, patient stated she was calling for her boyfriend. I advised the patient that her boyfriend had left a while ago. Patient stated she must've been dreaming.

## 2023-03-14 NOTE — Assessment & Plan Note (Signed)
Presenting issue. Patient was found to be unresponsive with a blood glucose of 42 and responded to D10.

## 2023-03-14 NOTE — ED Notes (Signed)
Patient ambulated to the bathroom with assistance.

## 2023-03-14 NOTE — ED Provider Notes (Signed)
[place] EMERGENCY DEPARTMENT AT [facility] Provider Note  CSN: [id number] Arrival date & time: 03/13/23 2046  Chief Complaint(s) Hypoglycemia  HPI [name] is a 72 y.o. female with history of diabetes, schizoaffective disorder presenting with found down.  Per paramedics and family, patient was unresponsive, patient and family deny any doubling up on insulin, reports she has been taking it mostly as prescribed and if anything missing doses.  She went to outside ER yesterday for foot ulcer and was prescribed Bactrim but reports she "lost" it.  She denies any recent urinary symptoms, fevers or chills, chest pain, abdominal pain.  No nausea or vomiting.  Per paramedics, patient was found outside on the ground.  She was unresponsive with a glucose of 42.  They placed an IO as they were unable to get this access.  They gave her D10 with improvement of her mental status.  Past Medical History Past Medical History:  Diagnosis Date   Allergy    Depression    Diabetes mellitus without complication    Hyperlipidemia    Hypertension    Mixed stress and urge urinary incontinence    Osteoporosis    Pneumonia    Schizophrenia    Stroke    Patient Active Problem List   Diagnosis Date Noted   Hyperglycemia 02/25/2023   DKA (diabetic ketoacidosis) 02/13/2023   Pseudohyponatremia 02/13/2023   Elevated beta-hydroxybutyrate 02/13/2023   Tachycardia 02/13/2023   Protein calorie malnutrition 02/13/2023   Type 1 diabetes mellitus with diabetic chronic kidney disease 08/16/2022   Hypertension associated with diabetes 08/16/2022   Dyslipidemia associated with type 2 diabetes mellitus 08/16/2022   Schizoaffective disorder 11/08/2014   AKI (acute kidney injury)    Home Medication(s) Prior to Admission medications   Medication Sig Start Date End Date Taking? Authorizing Provider  ARIPiprazole (ABILIFY) 30 MG tablet Take 1 tablet (30 mg total) by mouth at bedtime. 03/02/23  Yes  Meredeth Ide, MD  aspirin EC 81 MG tablet Take by mouth.   Yes [provider]  azithromycin (ZITHROMAX) 250 MG tablet Take 1 tablet (250 mg total) by mouth daily. Take first 2 tablets together, then 1 every day until finished. 03/13/23  Yes Lonell Grandchild, MD  benztropine (COGENTIN) 1 MG tablet Take 1 tablet (1 mg total) by mouth at bedtime. 03/02/23  Yes Meredeth Ide, MD  buPROPion (WELLBUTRIN) 75 MG tablet Take 1 tablet (75 mg total) by mouth 2 (two) times daily. 03/02/23  Yes Sharl Ma, Sarina Ill, MD  cefpodoxime (VANTIN) 200 MG tablet Take 1 tablet (200 mg total) by mouth 2 (two) times daily for 7 days. 03/13/23 03/20/23 Yes Lonell Grandchild, MD  haloperidol (HALDOL) 1 MG tablet Take 1 tablet (1 mg total) by mouth daily. 03/03/23  Yes Sharl Ma, Sarina Ill, MD  insulin aspart protamine- aspart (NOVOLOG MIX 70/30) (70-30) 100 UNIT/ML injection Inject 0.25 mLs (25 Units total) into the skin 2 (two) times daily with a meal. 03/02/23  Yes Sharl Ma, Sarina Ill, MD  albuterol (VENTOLIN HFA) 108 (90 Base) MCG/ACT inhaler TAKE 2 PUFFS BY MOUTH EVERY 6 HOURS AS NEEDED FOR WHEEZE OR SHORTNESS OF BREATH Patient not taking: Reported on 03/13/2023 12/03/22   Ardith Dark, MD  Continuous Blood Gluc Receiver (DEXCOM G6 RECEIVER) DEVI Use as directed for continuous glucose monitoring. 05/09/21   [provider]  Continuous Blood Gluc Sensor (FREESTYLE LIBRE 2 SENSOR) MISC 1 each by Does not apply route every 14 (fourteen) days. 01/14/23  Ardith Dark, MD  Insulin Pen Needle 32G X 4 MM MISC Use with Insulin pen 03/02/23   Meredeth Ide, MD  Insulin Syringe-Needle U-100 (INSULIN SYRINGE .5CC/31GX5/16") 31G X 5/16" 0.5 ML MISC Use as directed 03/02/23   Meredeth Ide, MD  metoprolol succinate (TOPROL-XL) 25 MG 24 hr tablet Take 0.5 tablets (12.5 mg total) by mouth daily. Patient not taking: Reported on 02/26/2023 01/09/22   Janeece Agee, NP  OYSCO 500 500 MG TABS Take 1 tablet (1,250 mg total) by mouth  daily. Patient not taking: Reported on 02/26/2023 01/03/22   Janeece Agee, NP  sulfamethoxazole-trimethoprim (BACTRIM DS) 800-160 MG tablet Take 1 tablet by mouth 2 (two) times daily for 10 days. 03/12/23 03/22/23  Al Decant, PA-C                                                                                                                                    Past Surgical History History reviewed. No pertinent surgical history. Family History Family History  Problem Relation Age of Onset   Heart disease Mother    Hypertension Father    Stroke Father    Heart disease Father    Diabetes Father    Emphysema Paternal Grandfather     Social History Social History   Tobacco Use   Smoking status: Former    Packs/day: 3    Types: Cigarettes   Smokeless tobacco: Never  Substance Use Topics   Alcohol use: Yes    Alcohol/week: 6.0 standard drinks of alcohol    Types: 6 Cans of beer per week   Drug use: No   Allergies Penicillins  Review of Systems Review of Systems  All other systems reviewed and are negative.   Physical Exam Vital Signs  I have reviewed the triage vital signs BP (!) 141/83   Pulse 80   Temp (!) 97.5 F (36.4 C) (Oral)   Resp 18   Ht 5\' 5"  (1.651 m)   Wt 74.8 kg   SpO2 93%   BMI 27.44 kg/m  Physical Exam Vitals and nursing note reviewed.  Constitutional:      General: She is not in acute distress.    Appearance: She is well-developed.     Comments: Chronically ill appearing  HENT:     Head: Normocephalic and atraumatic.     Mouth/Throat:     Mouth: Mucous membranes are moist.  Eyes:     Pupils: Pupils are equal, round, and reactive to light.  Cardiovascular:     Rate and Rhythm: Normal rate and regular rhythm.     Heart sounds: No murmur heard. Pulmonary:     Effort: Pulmonary effort is normal. No respiratory distress.     Breath sounds: Normal breath sounds.  Abdominal:     General: Abdomen is flat.     Palpations: Abdomen is  soft.     Tenderness: There is no  abdominal tenderness.  Musculoskeletal:        General: No tenderness.     Right lower leg: No edema.     Left lower leg: No edema.     Comments: Erythema over the dorsum of the right foot, large wound in the webspace between the first and second toes.  Wound probes deeply to bone with cotton swab  Skin:    General: Skin is warm and dry.  Neurological:     General: No focal deficit present.     Mental Status: She is alert. Mental status is at baseline.  Psychiatric:        Mood and Affect: Mood normal.        Behavior: Behavior normal.     ED Results and Treatments Labs (all labs ordered are listed, but only abnormal results are displayed) Labs Reviewed  COMPREHENSIVE METABOLIC PANEL - Abnormal; Notable for the following components:      Result Value   Glucose, Bld 110 (*)    Total Protein 8.3 (*)    All other components within normal limits  LACTIC ACID, PLASMA - Abnormal; Notable for the following components:   Lactic Acid, Venous 2.3 (*)    All other components within normal limits  URINALYSIS, W/ REFLEX TO CULTURE (INFECTION SUSPECTED) - Abnormal; Notable for the following components:   Glucose, UA 150 (*)    Hgb urine dipstick SMALL (*)    Protein, ur 100 (*)    Nitrite POSITIVE (*)    Leukocytes,Ua TRACE (*)    Bacteria, UA RARE (*)    All other components within normal limits  CBG MONITORING, ED - Abnormal; Notable for the following components:   Glucose-Capillary 150 (*)    All other components within normal limits  CULTURE, BLOOD (ROUTINE X 2)  CULTURE, BLOOD (ROUTINE X 2)  URINE CULTURE  CBC WITH DIFFERENTIAL/PLATELET  LACTIC ACID, PLASMA  C-REACTIVE PROTEIN  SEDIMENTATION RATE                                                                                                                          Radiology DG Chest Port 1 View  Result Date: 03/13/2023 CLINICAL DATA:  Unresponsive. EXAM: PORTABLE CHEST 1 VIEW COMPARISON:   February 25, 2023 FINDINGS: The heart size and mediastinal contours are within normal limits. Mild to moderate severity diffusely increased interstitial lung markings are seen. This is mildly increased in severity when compared to the prior study. Mild superimposed areas of atelectasis and/or infiltrate are noted within the lateral aspect of the right upper lobe and left lung base. There is no evidence of a pleural effusion or pneumothorax. The visualized skeletal structures are unremarkable. IMPRESSION: Mild right upper lobe and left basilar airspace disease with a mild superimposed component of interstitial edema. Electronically Signed   By: Aram Candela M.D.   On: 03/13/2023 22:03    Pertinent labs & imaging results that were available during my care of the patient were  reviewed by me and considered in my medical decision making (see MDM for details).  Medications Ordered in ED Medications  cefTRIAXone (ROCEPHIN) 2 g in sodium chloride 0.9 % 100 mL IVPB (has no administration in time range)  metroNIDAZOLE (FLAGYL) IVPB 500 mg (has no administration in time range)  lactated ringers bolus 1,500 mL (0 mLs Intravenous Stopped 03/14/23 0020)                                                                                                                                     Procedures Procedures  (including critical care time)  Medical Decision Making / ED Course   MDM:  72 year old female presenting to the emergency department after being found down.  Suspect likely due to hypoglycemia given improvement with glucose and initial hypoglycemia.  Physical examination also notable for large toe ulcer which probes deeply concerning for significant diabetic foot wound and osteomyelitis.  Will obtain x-ray as well as inflammatory markers.  Unclear cause of hypoglycemia, patient reports compliance with her insulin regimen.  No sign of acute kidney injury to suggest renal dysfunction as a cause.  Patient  initially hypothermic likely due to exposure but blood cultures also obtained.  Given diabetic wound will cover for osteomyelitis.  Urinalysis also somewhat positive but she denies any urinary symptoms.  Chest x-ray also shows possible infiltrate although she has not had recent coughing.  Given foot wound, elevated lactic acid may ultimately require admission for further management.  Signed out to oncoming provider pending XR, re-assessment.       Additional history obtained: -Additional history obtained from family and ems -External records from outside source obtained and reviewed including: Chart review including previous notes, labs, imaging, consultation notes including [facility] records   Lab Tests: -I ordered, reviewed, and interpreted labs.   The pertinent results include:   Labs Reviewed  COMPREHENSIVE METABOLIC PANEL - Abnormal; Notable for the following components:      Result Value   Glucose, Bld 110 (*)    Total Protein 8.3 (*)    All other components within normal limits  LACTIC ACID, PLASMA - Abnormal; Notable for the following components:   Lactic Acid, Venous 2.3 (*)    All other components within normal limits  URINALYSIS, W/ REFLEX TO CULTURE (INFECTION SUSPECTED) - Abnormal; Notable for the following components:   Glucose, UA 150 (*)    Hgb urine dipstick SMALL (*)    Protein, ur 100 (*)    Nitrite POSITIVE (*)    Leukocytes,Ua TRACE (*)    Bacteria, UA RARE (*)    All other components within normal limits  CBG MONITORING, ED - Abnormal; Notable for the following components:   Glucose-Capillary 150 (*)    All other components within normal limits  CULTURE, BLOOD (ROUTINE X 2)  CULTURE, BLOOD (ROUTINE X 2)  URINE CULTURE  CBC WITH DIFFERENTIAL/PLATELET  LACTIC ACID, PLASMA  C-REACTIVE PROTEIN  SEDIMENTATION RATE    Notable for elevated lactic acid    Imaging Studies ordered: I ordered imaging studies including CXR On my interpretation imaging  demonstrates possible pneumonia I independently visualized and interpreted imaging. I agree with the radiologist interpretation   Medicines ordered and prescription drug management: Meds ordered this encounter  Medications   DISCONTD: potassium chloride (KLOR-CON) packet 60 mEq   DISCONTD: magnesium sulfate IVPB 2 g 50 mL   DISCONTD: potassium chloride 10 mEq in 100 mL IVPB   lactated ringers bolus 1,500 mL   cefpodoxime (VANTIN) 200 MG tablet    Sig: Take 1 tablet (200 mg total) by mouth 2 (two) times daily for 7 days.    Dispense:  14 tablet    Refill:  0   azithromycin (ZITHROMAX) 250 MG tablet    Sig: Take 1 tablet (250 mg total) by mouth daily. Take first 2 tablets together, then 1 every day until finished.    Dispense:  6 tablet    Refill:  0   cefTRIAXone (ROCEPHIN) 2 g in sodium chloride 0.9 % 100 mL IVPB    Order Specific Question:   Antibiotic Indication:    Answer:   Osteomyelitis   metroNIDAZOLE (FLAGYL) IVPB 500 mg    -I have reviewed the patients home medicines and have made adjustments as needed   Cardiac Monitoring: The patient was maintained on a cardiac monitor.  I personally viewed and interpreted the cardiac monitored which showed an underlying rhythm of: NSR  Social Determinants of Health:  Diagnosis or treatment significantly limited by social determinants of health: former smoker   Reevaluation: After the interventions noted above, I reevaluated the patient and found that their symptoms have improved  Co morbidities that complicate the patient evaluation  Past Medical History:  Diagnosis Date   Allergy    Depression    Diabetes mellitus without complication    Hyperlipidemia    Hypertension    Mixed stress and urge urinary incontinence    Osteoporosis    Pneumonia    Schizophrenia    Stroke       Dispostion: Disposition decision including need for hospitalization was considered, and patient discharged from emergency  department.    Final Clinical Impression(s) / ED Diagnoses Final diagnoses:  Diabetic ulcer of right foot associated with type 1 diabetes mellitus, with other ulcer severity, unspecified part of foot     This chart was dictated using voice recognition software.  Despite best efforts to proofread,  errors can occur which can change the documentation meaning.    Lonell Grandchild, MD 03/14/23 [phone]

## 2023-03-14 NOTE — Progress Notes (Signed)
   03/14/23 2019  Vitals  Temp (!) 103 F (39.4 C)  Temp Source Oral  BP 132/62  MAP (mmHg) 82  BP Location Left Arm  BP Method Automatic  Patient Position (if appropriate) Lying  Pulse Rate (!) 105  Pulse Rate Source Monitor  Resp 20  MEWS COLOR  MEWS Score Color Yellow  Oxygen Therapy  SpO2 94 %  O2 Device Nasal Cannula  O2 Flow Rate (L/min) 3 L/min  MEWS Score  MEWS Temp 2  MEWS Systolic 0  MEWS Pulse 1  MEWS RR 0  MEWS LOC 0  MEWS Score 3   Patient given tylenol and ice packs placed under arms. Doctor notified

## 2023-03-14 NOTE — ED Notes (Signed)
Patient transported to MRI 

## 2023-03-14 NOTE — Assessment & Plan Note (Addendum)
Present on admission. Secondary to cellulitis and infected foot ulcer. Urinalysis also concerning for UTI and chest x-ray concerning for possible pneumonia. Empiric antibiotics started. Blood and urine cultures obtained. Urine culture significant for ESBL Klebsiella infection and blood cultures are no growth to date. -Discontinue Cefepime and start meropenem

## 2023-03-14 NOTE — ED Notes (Signed)
Patient ambulated to the bathroom with assistance for a bowel movement.

## 2023-03-14 NOTE — Assessment & Plan Note (Addendum)
Patient required IVC on 4/29 for attempts to elope without evidence of capacity. -Continue Abilify, Wellbutrin, Haldol, Cogentin -Psychiatry consult (4/29) pending

## 2023-03-14 NOTE — Assessment & Plan Note (Addendum)
Patient is managed on Novolog mix 70/30 25 units BID as an outpatient. Patient presented with hypoglycemia. Last hemoglobin A1C of 14.1% from 01/2023. SSI restarted on admission. Blood sugar trending up on SSI management only. -Continue SSI -Add Semglee 15 units daily

## 2023-03-14 NOTE — Assessment & Plan Note (Addendum)
Urinalysis highly suggestive of a UTI. Urine culture obtained and is significant for ESBL Klebsiella Pneumoniae. -Continue meropenem and treat for 5 days total

## 2023-03-14 NOTE — Consult Note (Signed)
Reason for Consult: Cellulitis, right foot Referring Physician: Dr. [name] is an 72 y.o. female.  HPI: Patient is a 72 year old white female with a history of diabetes mellitus, hypertension, schizophrenia, and stroke who presented to the emergency room with a history of mental status change.  She had been previously seen in the emergency room for cellulitis of the right foot and was prescribed Bactrim.  She was brought into the emergency room after an episode of hypoglycemia.  I have been asked to see the patient as she has evidence of cellulitis of the right foot.  Patient states the right foot is tender and is more swollen than the left foot.  History from the patient is limited.  The chart was reviewed.  Past Medical History:  Diagnosis Date   Allergy    Depression    Diabetes mellitus without complication    Hyperlipidemia    Hypertension    Mixed stress and urge urinary incontinence    Osteoporosis    Pneumonia    Schizophrenia    Stroke     History reviewed. No pertinent surgical history.  Family History  Problem Relation Age of Onset   Heart disease Mother    Hypertension Father    Stroke Father    Heart disease Father    Diabetes Father    Emphysema Paternal Grandfather     Social History:  reports that she has quit smoking. Her smoking use included cigarettes. She smoked an average of 3 packs per day. She has never used smokeless tobacco. She reports current alcohol use of about 6.0 standard drinks of alcohol per week. She reports that she does not use drugs.  Allergies:  Allergies  Allergen Reactions   Penicillins Rash and Hives    Did it involve swelling of the face/tongue/throat, SOB, or low BP? Unknown Did it involve sudden or severe rash/hives, skin peeling, or any reaction on the inside of your mouth or nose? Unknown Did you need to seek medical attention at a hospital or doctor's office? Unknown When did it last happen?       If all  above answers are "NO", may proceed with cephalosporin use.  Did it involve swelling of the face/tongue/throat, SOB, or low BP? Unknown Did it involve sudden or severe rash/hives, skin peeling, or any reaction on the inside of your mouth or nose? Unknown Did you need to seek medical attention at a hospital or doctor's office? Unknown When did it last happen?       If all above answers are "NO", may proceed with cephalosporin use.    Medications: I have reviewed the patient's current medications. Prior to Admission:  Medications Prior to Admission  Medication Sig Dispense Refill Last Dose   ARIPiprazole (ABILIFY) 30 MG tablet Take 1 tablet (30 mg total) by mouth at bedtime. 30 tablet 2 03/12/2023   aspirin EC 81 MG tablet Take by mouth.   03/12/2023   benztropine (COGENTIN) 1 MG tablet Take 1 tablet (1 mg total) by mouth at bedtime. 30 tablet 1 03/12/2023   buPROPion (WELLBUTRIN) 75 MG tablet Take 1 tablet (75 mg total) by mouth 2 (two) times daily. 60 tablet 2 03/12/2023   haloperidol (HALDOL) 1 MG tablet Take 1 tablet (1 mg total) by mouth daily. 30 tablet 2 03/12/2023   insulin aspart protamine- aspart (NOVOLOG MIX 70/30) (70-30) 100 UNIT/ML injection Inject 0.25 mLs (25 Units total) into the skin 2 (two) times daily with a meal. 20  mL 2 03/12/2023   albuterol (VENTOLIN HFA) 108 (90 Base) MCG/ACT inhaler TAKE 2 PUFFS BY MOUTH EVERY 6 HOURS AS NEEDED FOR WHEEZE OR SHORTNESS OF BREATH (Patient not taking: Reported on 03/13/2023) 18 each 0 Not Taking   Continuous Blood Gluc Receiver (DEXCOM G6 RECEIVER) DEVI Use as directed for continuous glucose monitoring.      Continuous Blood Gluc Sensor (FREESTYLE LIBRE 2 SENSOR) MISC 1 each by Does not apply route every 14 (fourteen) days. 7 each 1    Insulin Pen Needle 32G X 4 MM MISC Use with Insulin pen 100 each 3    Insulin Syringe-Needle U-100 (INSULIN SYRINGE .5CC/31GX5/16") 31G X 5/16" 0.5 ML MISC Use as directed 100 each 2    metoprolol succinate  (TOPROL-XL) 25 MG 24 hr tablet Take 0.5 tablets (12.5 mg total) by mouth daily. (Patient not taking: Reported on 02/26/2023) 90 tablet 1 Not Taking   OYSCO 500 500 MG TABS Take 1 tablet (1,250 mg total) by mouth daily. (Patient not taking: Reported on 02/26/2023) 90 tablet 1 Not Taking   sulfamethoxazole-trimethoprim (BACTRIM DS) 800-160 MG tablet Take 1 tablet by mouth 2 (two) times daily for 10 days. 20 tablet 0     Results for orders placed or performed during the hospital encounter of 03/13/23 (from the past 48 hour(s))  CBG monitoring, ED     Status: Abnormal   Collection Time: 03/13/23  8:58 PM  Result Value Ref Range   Glucose-Capillary 150 (H) 70 - 99 mg/dL    Comment: Glucose reference range applies only to samples taken after fasting for at least 8 hours.  Urinalysis, w/ Reflex to Culture (Infection Suspected) -Urine, Clean Catch     Status: Abnormal   Collection Time: 03/13/23  9:25 PM  Result Value Ref Range   Specimen Source URINE, CLEAN CATCH    Color, Urine YELLOW YELLOW   APPearance CLEAR CLEAR   Specific Gravity, Urine 1.016 1.005 - 1.030   pH 5.0 5.0 - 8.0   Glucose, UA 150 (A) NEGATIVE mg/dL   Hgb urine dipstick SMALL (A) NEGATIVE   Bilirubin Urine NEGATIVE NEGATIVE   Ketones, ur NEGATIVE NEGATIVE mg/dL   Protein, ur 161 (A) NEGATIVE mg/dL   Nitrite POSITIVE (A) NEGATIVE   Leukocytes,Ua TRACE (A) NEGATIVE   RBC / HPF 21-50 0 - 5 RBC/hpf   WBC, UA 21-50 0 - 5 WBC/hpf    Comment:        Reflex urine culture not performed if WBC <=10, OR if Squamous epithelial cells >5. If Squamous epithelial cells >5 suggest recollection.    Bacteria, UA RARE (A) NONE SEEN   Squamous Epithelial / HPF 0-5 0 - 5 /HPF   Mucus PRESENT     Comment: Performed at [facility], [address], [place], [place] [address]  Urine Culture     Status: None (Preliminary result)   Collection Time: 03/13/23  9:25 PM   Specimen: Urine, Clean Catch  Result Value Ref Range   Specimen Description       URINE, CLEAN CATCH Performed at [facility], [address] [place], [place] [address]    Special Requests      NONE Reflexed from [id number] Performed at [facility], [address] [place], [place] [address]    Culture PENDING    Report Status PENDING   Strep pneumoniae urinary antigen     Status: None   Collection Time: 03/13/23  9:25 PM  Result Value Ref Range  Strep Pneumo Urinary Antigen NEGATIVE NEGATIVE    Comment:        Infection due to S. pneumoniae cannot be absolutely ruled out since the antigen present may be below the detection limit of the test. Performed at [facility], [address], [place], [place] [address]   Culture, blood (routine x 2)     Status: None (Preliminary result)   Collection Time: 03/13/23  9:40 PM   Specimen: BLOOD  Result Value Ref Range   Specimen Description BLOOD BLOOD LEFT ARM    Special Requests      BOTTLES DRAWN AEROBIC AND ANAEROBIC Blood Culture results may not be optimal due to an inadequate volume of blood received in culture bottles   Culture      NO GROWTH < 12 HOURS Performed at [facility], [address], [place], [place] [address]    Report Status PENDING   Comprehensive metabolic panel     Status: Abnormal   Collection Time: 03/13/23  9:42 PM  Result Value Ref Range   Sodium 136 135 - 145 mmol/L   Potassium 4.0 3.5 - 5.1 mmol/L   Chloride 100 98 - 111 mmol/L   CO2 24 22 - 32 mmol/L   Glucose, Bld 110 (H) 70 - 99 mg/dL    Comment: Glucose reference range applies only to samples taken after fasting for at least 8 hours.   BUN 22 8 - 23 mg/dL   Creatinine, Ser 1.60 0.44 - 1.00 mg/dL   Calcium 9.0 8.9 - 10.9 mg/dL   Total Protein 8.3 (H) 6.5 - 8.1 g/dL   Albumin 3.7 3.5 - 5.0 g/dL   AST 22 15 - 41 U/L   ALT 15 0 - 44 U/L   Alkaline Phosphatase 107 38 - 126 U/L   Total Bilirubin 0.4 0.3 - 1.2 mg/dL   GFR, Estimated >32 >35 mL/min    Comment: (NOTE) Calculated using the CKD-EPI Creatinine Equation  (2021)    Anion gap 12 5 - 15    Comment: Performed at [facility] [address] [place], [place] [address]  CBC with Differential     Status: None   Collection Time: 03/13/23  9:42 PM  Result Value Ref Range   WBC 5.3 4.0 - 10.5 K/uL   RBC 4.60 3.87 - 5.11 MIL/uL   Hemoglobin 12.2 12.0 - 15.0 g/dL   HCT 02.5 42.7 - 06.2 %   MCV 85.7 80.0 - 100.0 fL   MCH 26.5 26.0 - 34.0 pg   MCHC 31.0 30.0 - 36.0 g/dL   RDW 37.6 28.3 - 15.1 %   Platelets 241 150 - 400 K/uL   nRBC 0.0 0.0 - 0.2 %   Neutrophils Relative % 83 %   Neutro Abs 4.4 1.7 - 7.7 K/uL   Lymphocytes Relative 13 %   Lymphs Abs 0.7 0.7 - 4.0 K/uL   Monocytes Relative 3 %   Monocytes Absolute 0.2 0.1 - 1.0 K/uL   Eosinophils Relative 0 %   Eosinophils Absolute 0.0 0.0 - 0.5 K/uL   Basophils Relative 1 %   Basophils Absolute 0.0 0.0 - 0.1 K/uL   Immature Granulocytes 0 %   Abs Immature Granulocytes 0.01 0.00 - 0.07 K/uL    Comment: Performed at [facility], [address], [place], [place] [address]  Lactic acid, plasma     Status: Abnormal   Collection Time: 03/13/23  9:42 PM  Result Value Ref Range   Lactic Acid, Venous  2.3 (HH) 0.5 - 1.9 mmol/L    Comment: CRITICAL RESULT CALLED TO, READ BACK BY AND VERIFIED WITH  WALKER,T ON 03/13/23 AT 2225 BY LOY,C Performed at [facility], [address] [place], [place] [address]   Culture, blood (routine x 2)     Status: None (Preliminary result)   Collection Time: 03/13/23  9:42 PM   Specimen: BLOOD  Result Value Ref Range   Specimen Description BLOOD BLOOD RIGHT HAND    Special Requests      BOTTLES DRAWN AEROBIC AND ANAEROBIC Blood Culture adequate volume   Culture      NO GROWTH < 12 HOURS Performed at [facility], [address] [place], [place] [address]    Report Status PENDING   Lactic acid, plasma     Status: None   Collection Time: 03/13/23 11:53 PM  Result Value Ref Range   Lactic Acid, Venous 1.5 0.5 - 1.9 mmol/L    Comment: Performed at [facility], [address] [place], [place] [address]  C-reactive protein     Status: Abnormal   Collection Time: 03/13/23 11:53 PM  Result Value Ref Range   CRP 1.0 (H) <1.0 mg/dL    Comment: Performed at [facility], [address], [place], [place] [address]  Sedimentation rate     Status: Abnormal   Collection Time: 03/13/23 11:53 PM  Result Value Ref Range   Sed Rate 60 (H) 0 - 22 mm/hr    Comment: Performed at [facility], [address], [place], [place] [address]  SARS Coronavirus 2 by RT PCR (hospital order, performed in [facility] hospital lab) *cepheid single result test* Anterior Nasal Swab     Status: None   Collection Time: 03/14/23  1:45 AM   Specimen: Anterior Nasal Swab  Result Value Ref Range   SARS Coronavirus 2 by RT PCR NEGATIVE NEGATIVE    Comment: (NOTE) SARS-CoV-2 target nucleic acids are NOT DETECTED.  The SARS-CoV-2 RNA is generally detectable in upper and lower respiratory specimens during the acute phase of infection. The lowest concentration of SARS-CoV-2 viral copies this assay can detect is 250 copies / mL. A negative result does not preclude SARS-CoV-2 infection and should not be used as the sole basis for treatment or other patient management decisions.  A negative result may occur with improper specimen collection / handling, submission of specimen other than nasopharyngeal swab, presence of viral mutation(s) within the areas targeted by this assay, and inadequate number of viral copies (<250 copies / mL). A negative result must be combined with clinical observations, patient history, and epidemiological information.  Fact Sheet for Patients:   RoadLapTop.co.za  Fact Sheet for Healthcare Providers: http://kim-miller.com/  This test is not yet approved or  cleared by the [place] FDA and has been authorized for detection and/or diagnosis of SARS-CoV-2 by FDA under an Emergency Use Authorization  (EUA).  This EUA will remain in effect (meaning this test can be used) for the duration of the COVID-19 declaration under Section 564(b)(1) of the Act, 21 U.S.C. section 360bbb-3(b)(1), unless the authorization is terminated or revoked sooner.  Performed at [facility], [address], [place], [place] [address]   C-reactive protein     Status: None   Collection Time: 03/14/23  3:32 AM  Result Value Ref Range   CRP 0.8 <1.0 mg/dL    Comment: Performed at [facility], [address] [place], [place] [address]  Prealbumin  Status: Abnormal   Collection Time: 03/14/23  3:32 AM  Result Value Ref Range   Prealbumin 14 (L) 18 - 38 mg/dL    Comment: Performed at [facility], [address], [place], [place] [address]  Protime-INR     Status: None   Collection Time: 03/14/23  3:32 AM  Result Value Ref Range   Prothrombin Time 13.0 11.4 - 15.2 seconds   INR 1.0 0.8 - 1.2    Comment: (NOTE) INR goal varies based on device and disease states. Performed at [facility], [address], [place], [place] [address]   Cortisol-am, blood     Status: None   Collection Time: 03/14/23  3:32 AM  Result Value Ref Range   Cortisol - AM 13.6 6.7 - 22.6 ug/dL    Comment: Performed at [facility], [address] [place], [place] [address]  Procalcitonin     Status: None   Collection Time: 03/14/23  3:32 AM  Result Value Ref Range   Procalcitonin <0.10 ng/mL    Comment:        Interpretation: PCT (Procalcitonin) <= 0.5 ng/mL: Systemic infection (sepsis) is not likely. Local bacterial infection is possible. (NOTE)       Sepsis PCT Algorithm           Lower Respiratory Tract                                      Infection PCT Algorithm    ----------------------------     ----------------------------         PCT < 0.25 ng/mL                PCT < 0.10 ng/mL          Strongly encourage             Strongly discourage   discontinuation of antibiotics    initiation of  antibiotics    ----------------------------     -----------------------------       PCT 0.25 - 0.50 ng/mL            PCT 0.10 - 0.25 ng/mL               OR       >80% decrease in PCT            Discourage initiation of                                            antibiotics      Encourage discontinuation           of antibiotics    ----------------------------     -----------------------------         PCT >= 0.50 ng/mL              PCT 0.26 - 0.50 ng/mL               AND        <80% decrease in PCT             Encourage initiation of  antibiotics       Encourage continuation           of antibiotics    ----------------------------     -----------------------------        PCT >= 0.50 ng/mL                  PCT > 0.50 ng/mL               AND         increase in PCT                  Strongly encourage                                      initiation of antibiotics    Strongly encourage escalation           of antibiotics                                     -----------------------------                                           PCT <= 0.25 ng/mL                                                 OR                                        > 80% decrease in PCT                                      Discontinue / Do not initiate                                             antibiotics  Performed at [facility], [address] [place], [place] [address]   Comprehensive metabolic panel     Status: Abnormal   Collection Time: 03/14/23  3:32 AM  Result Value Ref Range   Sodium 135 135 - 145 mmol/L   Potassium 3.7 3.5 - 5.1 mmol/L   Chloride 102 98 - 111 mmol/L   CO2 25 22 - 32 mmol/L   Glucose, Bld 228 (H) 70 - 99 mg/dL    Comment: Glucose reference range applies only to samples taken after fasting for at least 8 hours.   BUN 20 8 - 23 mg/dL   Creatinine, Ser 1.91 0.44 - 1.00 mg/dL   Calcium 8.0 (L) 8.9 - 10.3 mg/dL   Total Protein 5.8 (L) 6.5 -  8.1 g/dL   Albumin 2.5 (L) 3.5 - 5.0 g/dL   AST 15 15 - 41 U/L   ALT 13 0 - 44 U/L   Alkaline Phosphatase 76 38 - 126 U/L   Total Bilirubin  0.1 (L) 0.3 - 1.2 mg/dL   GFR, Estimated >40 >98 mL/min    Comment: (NOTE) Calculated using the CKD-EPI Creatinine Equation (2021)    Anion gap 8 5 - 15    Comment: Performed at [facility], [address], [place], [place] [address]  Magnesium     Status: None   Collection Time: 03/14/23  3:32 AM  Result Value Ref Range   Magnesium 1.8 1.7 - 2.4 mg/dL    Comment: Performed at [facility], [address] [place], [place] [address]  CBC with Differential/Platelet     Status: Abnormal   Collection Time: 03/14/23  5:23 AM  Result Value Ref Range   WBC 4.7 4.0 - 10.5 K/uL   RBC 3.39 (L) 3.87 - 5.11 MIL/uL   Hemoglobin 9.1 (L) 12.0 - 15.0 g/dL    Comment: REPEATED TO VERIFY VERIFIED BY RECOLLECT     HCT 28.2 (L) 36.0 - 46.0 %   MCV 83.2 80.0 - 100.0 fL   MCH 26.8 26.0 - 34.0 pg   MCHC 32.3 30.0 - 36.0 g/dL   RDW 62.1 30.8 - 65.7 %   Platelets 189 150 - 400 K/uL   nRBC 0.0 0.0 - 0.2 %   Neutrophils Relative % 85 %   Neutro Abs 4.0 1.7 - 7.7 K/uL   Lymphocytes Relative 10 %   Lymphs Abs 0.5 (L) 0.7 - 4.0 K/uL   Monocytes Relative 5 %   Monocytes Absolute 0.2 0.1 - 1.0 K/uL   Eosinophils Relative 0 %   Eosinophils Absolute 0.0 0.0 - 0.5 K/uL   Basophils Relative 0 %   Basophils Absolute 0.0 0.0 - 0.1 K/uL   Immature Granulocytes 0 %   Abs Immature Granulocytes 0.02 0.00 - 0.07 K/uL    Comment: Performed at [facility], [address] [place], [place] [address]  Sedimentation rate     Status: Abnormal   Collection Time: 03/14/23  5:23 AM  Result Value Ref Range   Sed Rate 55 (H) 0 - 22 mm/hr    Comment: Performed at [facility], [address] [place], [place] [address]  Type and screen [facility]     Status: None   Collection Time: 03/14/23  6:08 AM  Result Value Ref Range   ABO/RH(D) B POS    Antibody Screen NEG     Sample Expiration      03/17/2023,2359 Performed at [facility], [address], [place], [place] [address]   CBG monitoring, ED     Status: Abnormal   Collection Time: 03/14/23  7:42 AM  Result Value Ref Range   Glucose-Capillary 155 (H) 70 - 99 mg/dL    Comment: Glucose reference range applies only to samples taken after fasting for at least 8 hours.  CBG monitoring, ED     Status: Abnormal   Collection Time: 03/14/23 11:55 AM  Result Value Ref Range   Glucose-Capillary 140 (H) 70 - 99 mg/dL    Comment: Glucose reference range applies only to samples taken after fasting for at least 8 hours.    US ARTERIAL ABI (SCREENING LOWER EXTREMITY)  Result Date: 03/14/2023 CLINICAL DATA:  Diabetic foot ulcer EXAM: NONINVASIVE PHYSIOLOGIC VASCULAR STUDY OF BILATERAL LOWER EXTREMITIES TECHNIQUE: Evaluation of both lower extremities were performed at rest, including calculation of ankle-brachial indices with single level pressure measurements and doppler recording. COMPARISON:  None available. FINDINGS: Right ABI:  1.29 Left ABI:  1.28 Right Lower Extremity: Monophasic posterior tibial and dorsalis  pedis artery waveforms. Left Lower Extremity: Monophasic posterior tibial and dorsalis pedis artery waveforms. IMPRESSION: Although ankle-brachial indices are within normal limits, bilateral monophasic dorsalis pedis and posterior tibial artery waveforms are suspicious for underlying arterial occlusive disease. Further evaluation with CT angiography of the lower extremities would be beneficial. Electronically Signed   By: Acquanetta Belling M.D.   On: 03/14/2023 10:58   MR FOOT RIGHT W WO CONTRAST  Result Date: 03/14/2023 CLINICAL DATA:  Right foot pain.  Open wound. EXAM: MRI OF THE RIGHT FOREFOOT WITHOUT AND WITH CONTRAST TECHNIQUE: Multiplanar, multisequence MR imaging of the right foot was performed before and after the administration of intravenous contrast. CONTRAST:  7mL GADAVIST GADOBUTROL 1 MMOL/ML IV SOLN  COMPARISON:  Radiographs 03/14/2023 FINDINGS: There is an open wound noted on the plantar aspect of the forefoot between the first and second toes. No subcutaneous abscess is identified. There is diffuse severe cellulitis involving the entire foot. Mild diffuse myofasciitis but no findings for pyomyositis. No MR findings suspicious for septic arthritis or osteomyelitis. The major tendons and ligaments appear intact. IMPRESSION: 1. Open wound on the plantar aspect of the forefoot between the first and second toes. No subcutaneous abscess. 2. Diffuse severe cellulitis involving the entire foot. Mild diffuse myofasciitis but no findings for pyomyositis. 3. No MR findings suspicious for septic arthritis or osteomyelitis. Electronically Signed   By: Rudie Meyer M.D.   On: 03/14/2023 09:43   DG Foot Complete Right  Result Date: 03/14/2023 CLINICAL DATA:  Foot ulcer EXAM: RIGHT FOOT COMPLETE - 3+ VIEW COMPARISON:  None Available. FINDINGS: There is no evidence of fracture or dislocation. There is no evidence of arthropathy or other focal bone abnormality. Soft tissues are unremarkable. IMPRESSION: Negative. Electronically Signed   By: Charlett Nose M.D.   On: 03/14/2023 00:48   DG Chest Port 1 View  Result Date: 03/13/2023 CLINICAL DATA:  Unresponsive. EXAM: PORTABLE CHEST 1 VIEW COMPARISON:  February 25, 2023 FINDINGS: The heart size and mediastinal contours are within normal limits. Mild to moderate severity diffusely increased interstitial lung markings are seen. This is mildly increased in severity when compared to the prior study. Mild superimposed areas of atelectasis and/or infiltrate are noted within the lateral aspect of the right upper lobe and left lung base. There is no evidence of a pleural effusion or pneumothorax. The visualized skeletal structures are unremarkable. IMPRESSION: Mild right upper lobe and left basilar airspace disease with a mild superimposed component of interstitial edema.  Electronically Signed   By: Aram Candela M.D.   On: 03/13/2023 22:03    ROS:  Review of systems not obtained due to patient factors.  Blood pressure 124/62, pulse 91, temperature 99.8 F (37.7 C), temperature source Oral, resp. rate 18, height 5\' 5"  (1.651 m), weight 74.8 kg, SpO2 98 %. Physical Exam: Pleasant white female no acute distress Head is normocephalic, atraumatic Lungs clear to auscultation with good breath sounds bilaterally Heart examination reveals a regular rate and rhythm without S3, S4, murmurs Extremity examination reveals bilateral femoral pulses are palpable.  In the right foot, I was able to palpate a dorsalis pedis pulse.  She does have swelling of the right foot with a small break in the skin between the first and second toes.  She does not have acute ischemic changes of the toes.  MR scan reveals no drainable abscess cavity or gas extending up the foot.  Some edema of the muscle and fascia noted.  No evidence of osteomyelitis  Arterial segmental Doppler study reviewed  Assessment/Plan: Impression: Diabetic foot cellulitis.  No abscess or osteomyelitis present. Plan: No need for acute surgical intervention at the present time.  Would continue vancomycin and cefepime.  As her pulses are palpable, I doubt she is in need of a vascular surgery consult at the present time.  I will follow with you.  Hopefully can switch back to oral antibiotic upon discharge. [name] 03/14/2023, 5:04 PM

## 2023-03-14 NOTE — H&P (Signed)
History and Physical    Patient: [name] ZOX:[id number] DOB: [birth year] DOA: 03/13/2023 DOS: the patient was seen and examined on 03/14/2023 PCP: Ardith Dark, MD  Patient coming from: Home  Chief Complaint:  Chief Complaint  Patient presents with   Hypoglycemia   HPI: [name] is a 72 y.o. female with medical history significant of depression, diabetes mellitus, hyperlipidemia, hypertension, schizophrenia, and more presents to the ED with a chief complaint of altered mental status.  Unfortunately, patient does not remember much of the episode.  After speaking with the ED staff and reviewing the chart, it seems that patient was seen yesterday for a diabetic foot ulcer and prescribed Bactrim.  She has not yet started it.  Today she was found unresponsive, questionably outside, and family reported to nursing staff that they gave her her shot of insulin when they found her this way.  Patient herself reports that they do not normally check her sugar before they administer insulin.  She takes her insulin every day per her report, and she checks her glucose approximately once per week.  She reports that her glucose runs about 120, but when advised that a hemoglobin A1c of 14 does not line up with a glucose of 120, she readily says she does not check her glucose as often as she should.  When paramedics got there they found her to have a glucose of 42.  They were not able to get a vein so they drilled the IO to give her D10 per the ED per the ED report.  As the glucose improved to the patient's mentation.  She reports remembering people talking about her glucose being low but she does not remember being unresponsive, or what happened immediately before being unresponsive.  The only complaint that she had from earlier in the day was a pain in her wrist on the right.  Unfortunately she is not able to provide any more history about this specific episode.  Patient reports that she is  taking [place] and NovoLog at home.  She is not sure of the dosages.  She reports she has had a right foot infection that she is not sure how long it has been there.  Patient reports that she has sensation in her feet, and has had a dull ache at the site of the ulcer for the last 2-3 days.  Appearance of the ulcer seems as though it has been there much longer.  She denies any fever, cough, dyspnea.  She denies any urinary symptoms.  Patient reports for last 3-4 days she has been progressively more fatigued and had general malaise.  Patient has no other complaints at this time.  Patient does not smoke, does not drink, does not use illicit drugs.  She is not vaccinated.  Patient reports that she would prefer to have a natural death if something happened where her heart stopped beating. Review of Systems: As mentioned in the history of present illness. All other systems reviewed and are negative. Past Medical History:  Diagnosis Date   Allergy    Depression    Diabetes mellitus without complication    Hyperlipidemia    Hypertension    Mixed stress and urge urinary incontinence    Osteoporosis    Pneumonia    Schizophrenia    Stroke    History reviewed. No pertinent surgical history. Social History:  reports that she has quit smoking. Her smoking use included cigarettes. She smoked an average of 3 packs  per day. She has never used smokeless tobacco. She reports current alcohol use of about 6.0 standard drinks of alcohol per week. She reports that she does not use drugs.  Allergies  Allergen Reactions   Penicillins Rash and Hives    Did it involve swelling of the face/tongue/throat, SOB, or low BP? Unknown Did it involve sudden or severe rash/hives, skin peeling, or any reaction on the inside of your mouth or nose? Unknown Did you need to seek medical attention at a hospital or doctor's office? Unknown When did it last happen?       If all above answers are "NO", may proceed with cephalosporin  use.  Did it involve swelling of the face/tongue/throat, SOB, or low BP? Unknown Did it involve sudden or severe rash/hives, skin peeling, or any reaction on the inside of your mouth or nose? Unknown Did you need to seek medical attention at a hospital or doctor's office? Unknown When did it last happen?       If all above answers are "NO", may proceed with cephalosporin use.    Family History  Problem Relation Age of Onset   Heart disease Mother    Hypertension Father    Stroke Father    Heart disease Father    Diabetes Father    Emphysema Paternal Grandfather     Prior to Admission medications   Medication Sig Start Date End Date Taking? Authorizing Provider  ARIPiprazole (ABILIFY) 30 MG tablet Take 1 tablet (30 mg total) by mouth at bedtime. 03/02/23  Yes Meredeth Ide, MD  aspirin EC 81 MG tablet Take by mouth.   Yes [provider]  azithromycin (ZITHROMAX) 250 MG tablet Take 1 tablet (250 mg total) by mouth daily. Take first 2 tablets together, then 1 every day until finished. 03/13/23  Yes Lonell Grandchild, MD  benztropine (COGENTIN) 1 MG tablet Take 1 tablet (1 mg total) by mouth at bedtime. 03/02/23  Yes Meredeth Ide, MD  buPROPion (WELLBUTRIN) 75 MG tablet Take 1 tablet (75 mg total) by mouth 2 (two) times daily. 03/02/23  Yes Sharl Ma, Sarina Ill, MD  cefpodoxime (VANTIN) 200 MG tablet Take 1 tablet (200 mg total) by mouth 2 (two) times daily for 7 days. 03/13/23 03/20/23 Yes Lonell Grandchild, MD  haloperidol (HALDOL) 1 MG tablet Take 1 tablet (1 mg total) by mouth daily. 03/03/23  Yes Sharl Ma, Sarina Ill, MD  insulin aspart protamine- aspart (NOVOLOG MIX 70/30) (70-30) 100 UNIT/ML injection Inject 0.25 mLs (25 Units total) into the skin 2 (two) times daily with a meal. 03/02/23  Yes Sharl Ma, Sarina Ill, MD  albuterol (VENTOLIN HFA) 108 (90 Base) MCG/ACT inhaler TAKE 2 PUFFS BY MOUTH EVERY 6 HOURS AS NEEDED FOR WHEEZE OR SHORTNESS OF BREATH Patient not taking: Reported on 03/13/2023  12/03/22   Ardith Dark, MD  Continuous Blood Gluc Receiver (DEXCOM G6 RECEIVER) DEVI Use as directed for continuous glucose monitoring. 05/09/21   [provider]  Continuous Blood Gluc Sensor (FREESTYLE LIBRE 2 SENSOR) MISC 1 each by Does not apply route every 14 (fourteen) days. 01/14/23   Ardith Dark, MD  Insulin Pen Needle 32G X 4 MM MISC Use with Insulin pen 03/02/23   Meredeth Ide, MD  Insulin Syringe-Needle U-100 (INSULIN SYRINGE .5CC/31GX5/16") 31G X 5/16" 0.5 ML MISC Use as directed 03/02/23   Meredeth Ide, MD  metoprolol succinate (TOPROL-XL) 25 MG 24 hr tablet Take 0.5 tablets (12.5 mg total) by mouth daily.  Patient not taking: Reported on 02/26/2023 01/09/22   Janeece Agee, NP  OYSCO 500 500 MG TABS Take 1 tablet (1,250 mg total) by mouth daily. Patient not taking: Reported on 02/26/2023 01/03/22   Janeece Agee, NP  sulfamethoxazole-trimethoprim (BACTRIM DS) 800-160 MG tablet Take 1 tablet by mouth 2 (two) times daily for 10 days. 03/12/23 03/22/23  Al Decant, PA-C    Physical Exam: Vitals:   03/14/23 0055 03/14/23 0130 03/14/23 0200 03/14/23 0453  BP:  131/69 (!) 126/51 137/68  Pulse:  (!) 102 94 (!) 111  Resp:  (!) Temp: 98.1 F (36.7 C)     TempSrc: Oral     SpO2:  96% 98% 96%  Weight:      Height:       1.  General: Patient lying supine in bed,  no acute distress   2. Psychiatric: Alert and oriented x 3, mood and behavior normal for situation, pleasant and cooperative with exam   3. Neurologic: Speech and language are normal, face is symmetric, moves all 4 extremities voluntarily, at baseline without acute deficits on limited exam   4. HEENMT:  Head is atraumatic, normocephalic, pupils reactive to light, neck is supple, trachea is midline, mucous membranes are moist   5. Respiratory : Lungs are clear to auscultation bilaterally without wheezing, rhonchi, rales, no cyanosis, no increase in work of breathing or accessory muscle use    6. Cardiovascular : Heart rate normal, rhythm is regular, murmur present, rubs or gallops, no peripheral edema, peripheral pulses palpated   7. Gastrointestinal:  Abdomen is soft, nondistended, nontender to palpation bowel sounds active, no masses or organomegaly palpated   8. Skin:  Wet, malodorous ulcer between first and second digit on right foot   9.Musculoskeletal:  No acute deformities or trauma, no asymmetry in tone, no peripheral edema, peripheral pulses palpated, no tenderness to palpation in the extremities  Data Reviewed: In the ED Temp 91.8 after Bair hugger 98.1, heart rate 65-92, respiratory rate 15-24, blood pressure 131/68- [id number] No leukocytosis with white blood cell count of 5.3, hemoglobin 12.2, platelets 241 Chemistries unremarkable Lactic acid 2.3>> 1.5 ESR 60 UA indicative of UTI Blood culture and urine culture pending Chest x-ray shows multifocal pneumonia X-ray foot is negative Patient received D10 with paramedics Glucose has been stable Patient is now off Bair hugger Admission requested for sepsis it could be multifactorial with evidence of UTI, pneumonia, and skin infection  Assessment and Plan: * Sepsis - Tachycardic at 102, tachypneic at 24, with respiratory failure with hypoxia And lactic acidosis at 2.3, and hypothermia - Body temperature corrected with Bair hugger - UA indicative of UTI - Chest x-ray indicative of pneumonia - Diabetic foot ulcer all also appears infected - Covering with Vanco and cefepime - Cultures pending - Lactic acid cleared after fluids - Continue to monitor  UTI (urinary tract infection) - UA indicative of UTI - Covered with cefepime - Urine culture pending  Acute respiratory failure with hypoxia - Secondary to sepsis and possible pneumonia - Chest x-ray shows multifocal pneumonia with mild interstitial edema - Patient is currently on Vanco and cefepime - No leukocytosis - Patient is able to to maintain oxygen  sats on room air at baseline but today she is requiring 2 L nasal cannula - Albuterol as needed - COVID-negative - Continue to monitor  Diabetic foot ulcer - Malodorous, wet ulcer between the first and second digit on the right foot - ABI in  the a.m. - ESR and CRP pending - X-ray foot was negative - Closely monitor glucose - Patient may need a general surgery consult for debridement - Continue to monitor  Dyslipidemia associated with type 2 diabetes mellitus - Patient generally runs hyperglycemic with a hemoglobin A1c of 14 - She presented hypoglycemic - Very cautiously using sliding scale coverage with custom sliding scale - Holding basal insulin - Continue to monitor  CAP (community acquired pneumonia) - Chest x-ray shows multifocal pneumonia with mild interstitial edema - Covered with Vanco and cefepime - Strep and Legionella and urine antigens pending - Sputum culture pending - Blood culture pending - Continue to monitor  Mood disorder (HCC) - Continue Abilify, Wellbutrin, Haldol, Cogentin      Advance Care Planning:   Code Status: DNR  Consults: None at this time  Family Communication: No family at bedside  Severity of Illness: The appropriate patient status for this patient is INPATIENT. Inpatient status is judged to be reasonable and necessary in order to provide the required intensity of service to ensure the patient's safety. The patient's presenting symptoms, physical exam findings, and initial radiographic and laboratory data in the context of their chronic comorbidities is felt to place them at high risk for further clinical deterioration. Furthermore, it is not anticipated that the patient will be medically stable for discharge from the hospital within 2 midnights of admission.   * I certify that at the point of admission it is my clinical judgment that the patient will require inpatient hospital care spanning beyond 2 midnights from the point of admission due  to high intensity of service, high risk for further deterioration and high frequency of surveillance required.*  Author: Lilyan Gilford, DO 03/14/2023 5:57 AM  For on call review [email or web address].

## 2023-03-14 NOTE — Progress Notes (Signed)
PROGRESS NOTE    [name]  YNW:[id number] DOB: [birth year] DOA: 03/13/2023 PCP: Ardith Dark, MD   Brief Narrative: [name] is a 72 y.o. female with a history of diabetes mellitus type 2, hyperlipidemia, hypertension, schizophrenia. Patient presented secondary to altered mental status in setting of hypoglycemia. On admission, she was found to meet criteria for sepsis with associated hypothermia requiring re-warming. Blood and urine cultures obtained and empiric antibiotics started. Chest x-ray concerning for pneumonia.  Presentation also complicated by worsened right foot cellulitis with associated diabetic foot ulcer.   Assessment and Plan: * Sepsis Present on admission. Secondary to cellulitis and infected foot ulcer. Urinalysis also concerning for UTI and chest x-ray concerning for possible pneumonia. Empiric antibiotics started. Blood and urine cultures obtained. -Follow-up blood/urine cultures -Continue Vancomycin/Cefepime pending culture data  Hypoglycemia Presenting issue. Patient was found to be unresponsive with a blood glucose of 42 and responded to D10.  Cellulitis in diabetic foot Secondary to infected diabetic foot ulcer. MRI obtained and is without evidence of underlying abscess or osteomyelitis. -Continue Vancomycin/Cefepime  UTI (urinary tract infection) Urinalysis highly suggestive of a UTI. Urine culture obtained and is pending. Patient is on empiric antibiotics for sepsis which would cover UTI. -Follow-up urine culture data  Acute respiratory failure with hypoxia Secondary to pneumonia -Wean to room air as able.  Diabetic foot ulcer Patient with good pulses. ABI normal except with DP/PT artery waveforms suspicious for underlying disease. Recommendation for CT angiography of bilateral lower extremities recommended. CRP normal. ESR mildly elevated. X-ray and MRI negative for osteomyelitis. -General surgery for consideration of  debridement  Dyslipidemia associated with type 2 diabetes mellitus Patient is managed on Novolog as an outpatient. Patient presented with hypoglycemia. Last hemoglobin A1C of 14.1% from 01/2023. SSI restarted on admission. -Continue SSI  CAP (community acquired pneumonia) Multifocal pneumonia suggested on imaging. Empiric Vancomycin and Cefepime started on admission. Blood cultures and strep pneumo/legionella ur antigen testing ordered. -Continue Vancomycin/Cefepime for now -Follow-up blood cultures  Mood disorder (HCC) -Continue Abilify, Wellbutrin, Haldol, Cogentin  Hypothermia-resolved as of 03/14/2023 In setting of sepsis, but more likely related to hypoglycemia. Patient improved with the use of an active heated air blanket. Resolved.     DVT prophylaxis: Heparin subq Code Status:   Code Status: DNR Family Communication: None at bedside Disposition Plan: Discharge home pending transition to outpatient antibiotic regimen in addition to workup for right foot ulcer   Consultants:  None  Procedures:  None  Antimicrobials: Vancomycin Cefepime    Subjective: Patient reports no dyspnea or chest pain. No issues overnight. Worried about her foot infection  Objective: BP 123/67   Pulse 96   Temp 99.8 F (37.7 C) (Oral)   Resp (!) 26   Ht  (1.651 m)   Wt 74.8 kg   SpO2 95%   BMI 27.44 kg/m   Examination:  General exam: Appears calm and comfortable Respiratory system: Clear to auscultation. Respiratory effort normal. Cardiovascular system: S1 & S2 heard, RRR. 1+ systolic murmur. 2+ DP and PD of right LE Gastrointestinal system: Abdomen is nondistended, soft and nontender. No organomegaly or masses felt. Normal bowel sounds heard. Central nervous system: Alert and oriented. No focal neurological deficits. Musculoskeletal: No edema. No calf tenderness Skin: No cyanosis. Dorsum of right foot is erythematous with associated swelling. Ulcerative wound between first  and second digits of right foot. Psychiatry: Judgement and insight appear normal. Mood & affect appropriate.    Data Reviewed: I  have personally reviewed following labs and imaging studies  CBC Lab Results  Component Value Date   WBC 4.7 03/14/2023   RBC 3.39 (L) 03/14/2023   HGB 9.1 (L) 03/14/2023   HCT 28.2 (L) 03/14/2023   MCV 83.2 03/14/2023   MCH 26.8 03/14/2023   PLT 189 03/14/2023   MCHC 32.3 03/14/2023   RDW 15.4 03/14/2023   LYMPHSABS 0.5 (L) 03/14/2023   MONOABS 0.2 03/14/2023   EOSABS 0.0 03/14/2023   BASOSABS 0.0 03/14/2023     Last metabolic panel Lab Results  Component Value Date   NA 135 03/14/2023   K 3.7 03/14/2023   CL 102 03/14/2023   CO2 25 03/14/2023   BUN 20 03/14/2023   CREATININE 0.58 03/14/2023   GLUCOSE 228 (H) 03/14/2023   GFRNONAA >60 03/14/2023   GFRAA >60 12/06/2019   CALCIUM 8.0 (L) 03/14/2023   PHOS 1.9 (L) 11/27/2020   PROT 5.8 (L) 03/14/2023   ALBUMIN 2.5 (L) 03/14/2023   BILITOT 0.1 (L) 03/14/2023   ALKPHOS 76 03/14/2023   AST 15 03/14/2023   ALT 13 03/14/2023   ANIONGAP 8 03/14/2023    GFR: Estimated Creatinine Clearance: 67.2 mL/min (by C-G formula based on SCr of 0.58 mg/dL).  Recent Results (from the past 240 hour(s))  Urine Culture     Status: None (Preliminary result)   Collection Time: 03/13/23  9:25 PM   Specimen: Urine, Clean Catch  Result Value Ref Range Status   Specimen Description   Final    URINE, CLEAN CATCH Performed at [facility], [address] [place], [place] [address]    Special Requests   Final    NONE Reflexed from [id number] Performed at [facility], [address], [place], [place] [address]    Culture PENDING  Incomplete   Report Status PENDING  Incomplete  Culture, blood (routine x 2)     Status: None (Preliminary result)   Collection Time: 03/13/23  9:40 PM   Specimen: BLOOD  Result Value Ref Range Status   Specimen Description BLOOD BLOOD LEFT ARM  Final   Special Requests    Final    BOTTLES DRAWN AEROBIC AND ANAEROBIC Blood Culture results may not be optimal due to an inadequate volume of blood received in culture bottles   Culture   Final    NO GROWTH < 12 HOURS Performed at [facility], [address], [place], [place] [address]    Report Status PENDING  Incomplete  Culture, blood (routine x 2)     Status: None (Preliminary result)   Collection Time: 03/13/23  9:42 PM   Specimen: BLOOD  Result Value Ref Range Status   Specimen Description BLOOD BLOOD RIGHT HAND  Final   Special Requests   Final    BOTTLES DRAWN AEROBIC AND ANAEROBIC Blood Culture adequate volume   Culture   Final    NO GROWTH < 12 HOURS Performed at [facility], [address] [place], [place] [address]    Report Status PENDING  Incomplete  SARS Coronavirus 2 by RT PCR (hospital order, performed in [facility] hospital lab) *cepheid single result test* Anterior Nasal Swab     Status: None   Collection Time: 03/14/23  1:45 AM   Specimen: Anterior Nasal Swab  Result Value Ref Range Status   SARS Coronavirus 2 by RT PCR NEGATIVE NEGATIVE Final    Comment: (NOTE) SARS-CoV-2 target nucleic acids are NOT DETECTED.  The SARS-CoV-2 RNA is generally detectable in upper  and lower respiratory specimens during the acute phase of infection. The lowest concentration of SARS-CoV-2 viral copies this assay can detect is 250 copies / mL. A negative result does not preclude SARS-CoV-2 infection and should not be used as the sole basis for treatment or other patient management decisions.  A negative result may occur with improper specimen collection / handling, submission of specimen other than nasopharyngeal swab, presence of viral mutation(s) within the areas targeted by this assay, and inadequate number of viral copies (<250 copies / mL). A negative result must be combined with clinical observations, patient history, and epidemiological information.  Fact Sheet for Patients:    RoadLapTop.co.za  Fact Sheet for Healthcare Providers: http://kim-miller.com/  This test is not yet approved or  cleared by the [place] FDA and has been authorized for detection and/or diagnosis of SARS-CoV-2 by FDA under an Emergency Use Authorization (EUA).  This EUA will remain in effect (meaning this test can be used) for the duration of the COVID-19 declaration under Section 564(b)(1) of the Act, 21 U.S.C. section 360bbb-3(b)(1), unless the authorization is terminated or revoked sooner.  Performed at [facility], [address], [place], [place] [address]       Radiology Studies: DG Foot Complete Right  Result Date: 03/14/2023 CLINICAL DATA:  Foot ulcer EXAM: RIGHT FOOT COMPLETE - 3+ VIEW COMPARISON:  None Available. FINDINGS: There is no evidence of fracture or dislocation. There is no evidence of arthropathy or other focal bone abnormality. Soft tissues are unremarkable. IMPRESSION: Negative. Electronically Signed   By: Charlett Nose M.D.   On: 03/14/2023 00:48   DG Chest Port 1 View  Result Date: 03/13/2023 CLINICAL DATA:  Unresponsive. EXAM: PORTABLE CHEST 1 VIEW COMPARISON:  February 25, 2023 FINDINGS: The heart size and mediastinal contours are within normal limits. Mild to moderate severity diffusely increased interstitial lung markings are seen. This is mildly increased in severity when compared to the prior study. Mild superimposed areas of atelectasis and/or infiltrate are noted within the lateral aspect of the right upper lobe and left lung base. There is no evidence of a pleural effusion or pneumothorax. The visualized skeletal structures are unremarkable. IMPRESSION: Mild right upper lobe and left basilar airspace disease with a mild superimposed component of interstitial edema. Electronically Signed   By: Aram Candela M.D.   On: 03/13/2023 22:03   DG Foot Complete Right  Result Date: 03/12/2023 CLINICAL DATA:   Cellulitis throughout foot, diabetic foot wound on the base of the big toe and 2nd digit. Foot red and swollen EXAM: RIGHT FOOT COMPLETE - 3+ VIEW COMPARISON:  None available FINDINGS: No fracture or dislocation. Diffuse osteopenia.  No focal erosions to indicate osteomyelitis. Mild swelling of the dorsal forefoot soft tissues. IMPRESSION: 1. Mild swelling of the dorsal forefoot soft tissues. No focal erosions to indicate osteomyelitis. 2. Diffuse osteopenia. Electronically Signed   By: Acquanetta Belling M.D.   On: 03/12/2023 11:10      LOS: 0 days    Jacquelin Hawking, MD [facility] 03/14/2023, 8:36 AM   If 7PM-7AM, please contact night-coverage [email or web address]

## 2023-03-14 NOTE — Assessment & Plan Note (Addendum)
Patient with good pulses. ABI normal except with DP/PT artery waveforms suspicious for underlying disease. Recommendation for CT angiography of bilateral lower extremities recommended. CRP normal. ESR mildly elevated. X-ray and MRI negative for osteomyelitis. No debridement per general surgery. -wound care consult

## 2023-03-14 NOTE — ED Notes (Signed)
Patient was given something to eat. Patient was restarted on oxygen via [place] at 2L, due to oxygen saturation being 88% on room air.

## 2023-03-14 NOTE — Progress Notes (Signed)
Pharmacy Antibiotic Note  [name] is a 72 y.o. female admitted on 03/13/2023 with sepsis.  Pharmacy has been consulted for Vancomycin and Cefepime dosing.  Plan: Vancomycin 1750 mg IV x1, then 1250 units/hr every 24 hours  Goal AUV 400-550 Expected AUC 430.1   Cefepime 2 grams IV every 8 hours  Height:  (165.1 cm) Weight: 74.8 kg (164 lb 14.5 oz) IBW/kg (Calculated) : 57  Temp (24hrs), Avg:95.8 F (35.4 C), Min:91.8 F (33.2 C), Max:98.1 F (36.7 C)  Recent Labs  Lab 03/12/23 1031 03/13/23 2142 03/13/23 2353  WBC 4.8 5.3  --   CREATININE 0.66 0.58  --   LATICACIDVEN  --  2.3* 1.5    Estimated Creatinine Clearance: 67.2 mL/min (by C-G formula based on SCr of 0.58 mg/dL).    Allergies  Allergen Reactions   Penicillins Rash and Hives    Did it involve swelling of the face/tongue/throat, SOB, or low BP? Unknown Did it involve sudden or severe rash/hives, skin peeling, or any reaction on the inside of your mouth or nose? Unknown Did you need to seek medical attention at a hospital or doctor's office? Unknown When did it last happen?       If all above answers are "NO", may proceed with cephalosporin use.  Did it involve swelling of the face/tongue/throat, SOB, or low BP? Unknown Did it involve sudden or severe rash/hives, skin peeling, or any reaction on the inside of your mouth or nose? Unknown Did you need to seek medical attention at a hospital or doctor's office? Unknown When did it last happen?       If all above answers are "NO", may proceed with cephalosporin use.    Thank you for allowing pharmacy to be JOHNANNA BAKKEatient's care.  Diona Browner 03/14/2023 2:27 AM

## 2023-03-14 NOTE — TOC Progression Note (Addendum)
  Transition of Care [facility]) Screening Note   Patient Details  Name: [name] Date of Birth: [birth year]   Transition [facility] Care [facility]) CM/SW Contact:    Elliot Gault, LCSW Phone Number: 03/14/2023, 11:19 AM    Received [facility] consult for HH/DME/PCP needs. Per chart review, it appears patient may have been referred to [facility] upon dc from [facility] recently. Awaiting return call from [place]. Therapy has not seen patient here and no HH/DME recommendations are available. Pt has a PCP listed on her chart. If pt active with [facility], will make sure new HH orders are obtained prior to dc. Will clear this consult for now.  Per MD, pt to have MRI and may possibly transfer to [facility].  Transition of Care Department [facility]) has reviewed patient and no other TOC needs have been identified at this time. We will continue to monitor patient advancement through interdisciplinary progression rounds. If new patient transition needs arise, please place a TOC consult.

## 2023-03-14 NOTE — Hospital Course (Addendum)
[name] is a 72 y.o. female with a history of diabetes mellitus type 2, hyperlipidemia, hypertension, schizophrenia. Patient presented secondary to altered mental status in setting of hypoglycemia. On admission, she was found to meet criteria for sepsis with associated hypothermia requiring re-warming. Blood and urine cultures obtained and empiric antibiotics started. Chest x-ray concerning for pneumonia.  Presentation also complicated by worsened right foot cellulitis with associated diabetic foot ulcer. Patient also found to have evidence of ESBL Klebsiella pneumoniae UTI. During hospitalization, patient developed desire to leave, but secondary to lack of capacity, patient IVCd on 4/29.

## 2023-03-15 ENCOUNTER — Other Ambulatory Visit (HOSPITAL_COMMUNITY): Payer: Self-pay

## 2023-03-15 DIAGNOSIS — R6520 Severe sepsis without septic shock: Secondary | ICD-10-CM | POA: Diagnosis not present

## 2023-03-15 DIAGNOSIS — E11621 Type 2 diabetes mellitus with foot ulcer: Secondary | ICD-10-CM | POA: Diagnosis not present

## 2023-03-15 DIAGNOSIS — E10621 Type 1 diabetes mellitus with foot ulcer: Secondary | ICD-10-CM | POA: Diagnosis not present

## 2023-03-15 DIAGNOSIS — A419 Sepsis, unspecified organism: Secondary | ICD-10-CM | POA: Diagnosis not present

## 2023-03-15 DIAGNOSIS — L97518 Non-pressure chronic ulcer of other part of right foot with other specified severity: Secondary | ICD-10-CM | POA: Diagnosis not present

## 2023-03-15 DIAGNOSIS — J189 Pneumonia, unspecified organism: Secondary | ICD-10-CM | POA: Diagnosis not present

## 2023-03-15 LAB — CBC
HCT: 29.2 % — ABNORMAL LOW (ref 36.0–46.0)
Hemoglobin: 9 g/dL — ABNORMAL LOW (ref 12.0–15.0)
MCH: 26.5 pg (ref 26.0–34.0)
MCHC: 30.8 g/dL (ref 30.0–36.0)
MCV: 85.9 fL (ref 80.0–100.0)
Platelets: 166 10*3/uL (ref 150–400)
RBC: 3.4 MIL/uL — ABNORMAL LOW (ref 3.87–5.11)
RDW: 15.5 % (ref 11.5–15.5)
WBC: 4.8 10*3/uL (ref 4.0–10.5)
nRBC: 0 % (ref 0.0–0.2)

## 2023-03-15 LAB — PROCALCITONIN: Procalcitonin: 0.28 ng/mL

## 2023-03-15 LAB — CULTURE, BLOOD (ROUTINE X 2): Culture: NO GROWTH

## 2023-03-15 LAB — GLUCOSE, CAPILLARY
Glucose-Capillary: 244 mg/dL — ABNORMAL HIGH (ref 70–99)
Glucose-Capillary: 280 mg/dL — ABNORMAL HIGH (ref 70–99)
Glucose-Capillary: 229 mg/dL — ABNORMAL HIGH (ref 70–99)
Glucose-Capillary: 163 mg/dL — ABNORMAL HIGH (ref 70–99)

## 2023-03-15 LAB — LEGIONELLA PNEUMOPHILA SEROGP 1 UR AG: L. pneumophila Serogp 1 Ur Ag: NEGATIVE

## 2023-03-15 LAB — URINE CULTURE

## 2023-03-15 MED ORDER — LIVING WELL WITH DIABETES BOOK: Freq: Once | Status: DC

## 2023-03-15 MED ORDER — MEDIHONEY WOUND/BURN DRESSING EX PSTE
1.0000 | PASTE | Freq: Every day | CUTANEOUS | Status: DC
  Administered 2023-03-15 – 2023-03-21 (×7): 1 via TOPICAL
  Filled 2023-03-15: qty 44

## 2023-03-15 NOTE — NC FL2 (Signed)
[place] MEDICAID FL2 LEVEL OF CARE FORM     IDENTIFICATION  Patient Name: [name] Birthdate: 20-Nov-1950 Sex: female Admission Date (Current Location): 03/13/2023  [facility] [place] Number:  [employer] and Address:  [facility],  [address] [place] [address]      Provider Number: [id number]  Attending Physician Name and Address:  Narda Bonds, MD  Relative Name and Phone Number:       Current Level of Care: Hospital Recommended Level of Care: Skilled Nursing Facility Prior Approval Number:    Date Approved/Denied:   PASRR Number:    Discharge Plan: SNF    Current Diagnoses: Patient Active Problem List   Diagnosis Date Noted   Sepsis (HCC) 03/14/2023   Diabetic foot ulcer (HCC) 03/14/2023   Acute respiratory failure with hypoxia (HCC) 03/14/2023   UTI (urinary tract infection) 03/14/2023   Cellulitis of right foot 03/14/2023   Hyperglycemia 02/25/2023   DKA (diabetic ketoacidosis) (HCC) 02/13/2023   Pseudohyponatremia 02/13/2023   Elevated beta-hydroxybutyrate 02/13/2023   Tachycardia 02/13/2023   Protein calorie malnutrition (HCC) 02/13/2023   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   Hypertension associated with diabetes (HCC) 08/16/2022   Dyslipidemia associated with type 2 diabetes mellitus (HCC) 08/16/2022   Schizoaffective disorder (HCC) 11/08/2014   CAP (community acquired pneumonia)    AKI (acute kidney injury) (HCC)    Mood disorder (HCC) 05/22/2014    Orientation RESPIRATION BLADDER Height & Weight     Self, Place  Normal Incontinent Weight: 164 lb 14.5 oz (74.8 kg) Height:  5\' 5"  (165.1 cm)  BEHAVIORAL SYMPTOMS/MOOD NEUROLOGICAL BOWEL NUTRITION STATUS      Continent Diet (Heart healthy)  AMBULATORY STATUS COMMUNICATION OF NEEDS Skin     Verbally Normal, Other (Comment) (right foot ulcer)                       Personal Care Assistance Level of  Assistance  Bathing, Feeding, Dressing Bathing Assistance: Limited assistance Feeding assistance: Independent Dressing Assistance: Limited assistance Total Care Assistance: Maximum assistance   Functional Limitations Info  Sight, Hearing, Speech Sight Info: Adequate Hearing Info: Adequate Speech Info: Adequate    SPECIAL CARE FACTORS FREQUENCY  PT (By licensed PT), OT (By licensed OT)     PT Frequency: 5 times weekly OT Frequency: 5 times weekly            Contractures Contractures Info: Not present    Additional Factors Info  Code Status, Allergies Code Status Info: DNR Allergies Info: Penicillins           Current Medications (03/15/2023):  This is the current hospital active medication list Current Facility-Administered Medications  Medication Dose Route Frequency Provider Last Rate Last Admin   acetaminophen (TYLENOL) tablet 650 mg  650 mg Oral Q6H PRN Zierle-Ghosh, Asia B, DO   650 mg at 03/14/23 2021   Or   acetaminophen (TYLENOL) suppository 650 mg  650 mg Rectal Q6H PRN Zierle-Ghosh, Asia B, DO       albuterol (PROVENTIL) (2.5 MG/3ML) 0.083% nebulizer solution 2.5 mg  2.5 mg Nebulization Q4H PRN Zierle-Ghosh, Asia B, DO       ARIPiprazole (ABILIFY) tablet 30 mg  30 mg Oral QHS Zierle-Ghosh, Asia B, DO   30 mg at 03/14/23 2132   aspirin EC tablet 81 mg  81 mg Oral Daily Zierle-Ghosh, Asia B, DO   81 mg at 03/15/23 1118  benztropine (COGENTIN) tablet 1 mg  1 mg Oral QHS Zierle-Ghosh, Asia B, DO   1 mg at 03/14/23 2134   buPROPion [facility]) tablet 75 mg  75 mg Oral BID Zierle-Ghosh, Asia B, DO   75 mg at 03/15/23 1118   ceFEPIme (MAXIPIME) 2 g in sodium chloride 0.9 % 100 mL IVPB  2 g Intravenous Q8H Juliette Mangle, RPH 200 mL/hr at 03/15/23 1357 2 g at 03/15/23 1357   haloperidol (HALDOL) tablet 1 mg  1 mg Oral Daily Zierle-Ghosh, Asia B, DO   1 mg at 03/15/23 1119   heparin injection 5,000 Units  5,000 Units Subcutaneous Q8H Zierle-Ghosh, Asia B, DO   5,000  Units at 03/15/23 1358   insulin aspart (novoLOG) injection 0-5 Units  0-5 Units Subcutaneous TID WC Zierle-Ghosh, Asia B, DO   3 Units at 03/15/23 1359   insulin aspart (novoLOG) injection 0-5 Units  0-5 Units Subcutaneous QHS Zierle-Ghosh, Asia B, DO       leptospermum manuka honey (MEDIHONEY) paste 1 Application  1 Application Topical Daily Narda Bonds, MD       living well with diabetes book MISC   Does not apply Once Narda Bonds, MD       multivitamin with minerals tablet 1 tablet  1 tablet Oral Daily Narda Bonds, MD   1 tablet at 03/15/23 1118   ondansetron (ZOFRAN) tablet 4 mg  4 mg Oral Q6H PRN Zierle-Ghosh, Asia B, DO       Or   ondansetron (ZOFRAN) injection 4 mg  4 mg Intravenous Q6H PRN Zierle-Ghosh, Asia B, DO       oxyCODONE (Oxy IR/ROXICODONE) immediate release tablet 5 mg  5 mg Oral Q4H PRN Zierle-Ghosh, Asia B, DO   5 mg at 03/14/23 1254   vancomycin (VANCOREADY) IVPB 1250 mg/250 mL  1,250 mg Intravenous Q24H Juliette Mangle, RPH 166.7 mL/hr at 03/15/23 0041 1,250 mg at 03/15/23 0041     Discharge Medications: Please see discharge summary for a list of discharge medications.  Relevant Imaging Results:  Relevant Lab Results:   Additional Information SSN: [id number] [place]

## 2023-03-15 NOTE — Progress Notes (Signed)
Subjective: Patient has no complaints.  Objective: Vital signs in last 24 hours: Temp:  [99.8 F (37.7 C)-103 F (39.4 C)] 100 F (37.8 C) (04/26 0238) Pulse Rate:  [86-105] 86 (04/26 0238) Resp:  [17-22] 19 (04/26 0238) BP: (103-152)/(43-73) 118/51 (04/26 0238) SpO2:  [90 %-98 %] 92 % (04/26 0238) Last BM Date : 03/14/23 (Per pt)  Intake/Output from previous day: 04/25 0701 - 04/26 0700 In: 484.9 [P.O.:390; IV Piggyback:94.9] Out: -  Intake/Output this shift: No intake/output data recorded.  General appearance: alert, cooperative, and no distress Extremities: Significant decrease in swelling and erythema of the right foot.  No significant drainage.  No cyanosis observed.  Lab Results:  Recent Labs    03/14/23 0523 03/15/23 0439  WBC 4.7 4.8  HGB 9.1* 9.0*  HCT 28.2* 29.2*  PLT 189 166   BMET Recent Labs    03/13/23 2142 03/14/23 0332  NA 136 135  K 4.0 3.7  CL 100 102  CO2 24 25  GLUCOSE 110* 228*  BUN 22 20  CREATININE 0.58 0.58  CALCIUM 9.0 8.0*   PT/INR Recent Labs    03/14/23 0332  LABPROT 13.0  INR 1.0    Studies/Results: US ARTERIAL ABI (SCREENING LOWER EXTREMITY)  Result Date: 03/14/2023 CLINICAL DATA:  Diabetic foot ulcer EXAM: NONINVASIVE PHYSIOLOGIC VASCULAR STUDY OF BILATERAL LOWER EXTREMITIES TECHNIQUE: Evaluation of both lower extremities were performed at rest, including calculation of ankle-brachial indices with single level pressure measurements and doppler recording. COMPARISON:  None available. FINDINGS: Right ABI:  1.29 Left ABI:  1.28 Right Lower Extremity: Monophasic posterior tibial and dorsalis pedis artery waveforms. Left Lower Extremity: Monophasic posterior tibial and dorsalis pedis artery waveforms. IMPRESSION: Although ankle-brachial indices are within normal limits, bilateral monophasic dorsalis pedis and posterior tibial artery waveforms are suspicious for underlying arterial occlusive disease. Further evaluation with CT  angiography of the lower extremities would be beneficial. Electronically Signed   By: Acquanetta Belling M.D.   On: 03/14/2023 10:58   MR FOOT RIGHT W WO CONTRAST  Result Date: 03/14/2023 CLINICAL DATA:  Right foot pain.  Open wound. EXAM: MRI OF THE RIGHT FOREFOOT WITHOUT AND WITH CONTRAST TECHNIQUE: Multiplanar, multisequence MR imaging of the right foot was performed before and after the administration of intravenous contrast. CONTRAST:  7mL GADAVIST GADOBUTROL 1 MMOL/ML IV SOLN COMPARISON:  Radiographs 03/14/2023 FINDINGS: There is an open wound noted on the plantar aspect of the forefoot between the first and second toes. No subcutaneous abscess is identified. There is diffuse severe cellulitis involving the entire foot. Mild diffuse myofasciitis but no findings for pyomyositis. No MR findings suspicious for septic arthritis or osteomyelitis. The major tendons and ligaments appear intact. IMPRESSION: 1. Open wound on the plantar aspect of the forefoot between the first and second toes. No subcutaneous abscess. 2. Diffuse severe cellulitis involving the entire foot. Mild diffuse myofasciitis but no findings for pyomyositis. 3. No MR findings suspicious for septic arthritis or osteomyelitis. Electronically Signed   By: Rudie Meyer M.D.   On: 03/14/2023 09:43   DG Foot Complete Right  Result Date: 03/14/2023 CLINICAL DATA:  Foot ulcer EXAM: RIGHT FOOT COMPLETE - 3+ VIEW COMPARISON:  None Available. FINDINGS: There is no evidence of fracture or dislocation. There is no evidence of arthropathy or other focal bone abnormality. Soft tissues are unremarkable. IMPRESSION: Negative. Electronically Signed   By: Charlett Nose M.D.   On: 03/14/2023 00:48   DG Chest Port 1 View  Result Date: 03/13/2023 CLINICAL  DATA:  Unresponsive. EXAM: PORTABLE CHEST 1 VIEW COMPARISON:  February 25, 2023 FINDINGS: The heart size and mediastinal contours are within normal limits. Mild to moderate severity diffusely increased interstitial  lung markings are seen. This is mildly increased in severity when compared to the prior study. Mild superimposed areas of atelectasis and/or infiltrate are noted within the lateral aspect of the right upper lobe and left lung base. There is no evidence of a pleural effusion or pneumothorax. The visualized skeletal structures are unremarkable. IMPRESSION: Mild right upper lobe and left basilar airspace disease with a mild superimposed component of interstitial edema. Electronically Signed   By: Aram Candela M.D.   On: 03/13/2023 22:03    Anti-infectives: Anti-infectives (From admission, onward)    Start     Dose/Rate Route Frequency Ordered Stop   03/15/23 0000  vancomycin (VANCOREADY) IVPB 1250 mg/250 mL        1,250 mg 166.7 mL/hr over 90 Minutes Intravenous Every 24 hours 03/14/23 0338     03/14/23 0600  ceFEPIme (MAXIPIME) 2 g in sodium chloride 0.9 % 100 mL IVPB        2 g 200 mL/hr over 30 Minutes Intravenous Every 8 hours 03/14/23 0338     03/14/23 0145  vancomycin (VANCOREADY) IVPB 1500 mg/300 mL        1,500 mg 150 mL/hr over 120 Minutes Intravenous  Once 03/14/23 0143 03/14/23 0455   03/14/23 0030  cefTRIAXone (ROCEPHIN) 2 g in sodium chloride 0.9 % 100 mL IVPB        2 g 200 mL/hr over 30 Minutes Intravenous  Once 03/14/23 0016 03/14/23 0112   03/14/23 0030  metroNIDAZOLE (FLAGYL) IVPB 500 mg        500 mg 100 mL/hr over 60 Minutes Intravenous  Once 03/14/23 0016 03/14/23 0212   03/13/23 0000  cefpodoxime (VANTIN) 200 MG tablet        200 mg Oral 2 times daily 03/13/23 2327 03/20/23 2359   03/13/23 0000  azithromycin (ZITHROMAX) 250 MG tablet        250 mg Oral Daily 03/13/23 2327         Assessment/Plan: Impression: Cellulitis of diabetic right foot resolving.  No need for acute surgical intervention. Plan: Continue current antibiotic course.  May de-escalate and convert to p.o. prior to discharge.  LOS: 1 day    [name] 03/15/2023

## 2023-03-15 NOTE — TOC Initial Note (Addendum)
Transition of Care [facility]) - Initial/Assessment Note    Patient Details  Name: [name] MRN: [id number] Date of Birth: 07/15/1951  [facility] CM/SW Contact:    Elliot Gault, LCSW Phone Number: 03/15/2023, 1:14 PM  Clinical Narrative:                   Pt from home. Awaiting PT/OT evals. Pt is active with [facility] for RN. Updated Cory at [place]. They can add PT/OT if needed.  Weekend TOC will follow and assist.  1540: Notified by Diabetic educator that pt appears to not have prescription drug coverage. Spoke with [employer] financial counselor who confirms that pt has Medicare A/B and a supplement but does not appear to have Part D drug coverage. MD aware pt will need RX for [employer] 70/30 at dc. Pt has HHRN for diabetic teaching at home.   Expected Discharge Plan: Home w Home Health Services Barriers to Discharge: Continued Medical Work up   Patient Goals and CMS Choice Patient states their goals for this hospitalization and ongoing recovery are:: go home          Expected Discharge Plan and Services In-house Referral: Clinical Social Work     Living arrangements for the past 2 months: Single Family Home                                      Prior Living Arrangements/Services Living arrangements for the past 2 months: Single Family Home Lives with:: Significant Other Patient language and need for interpreter reviewed:: Yes Do you feel safe going back to the place where you live?: Yes      Need for Family Participation in Patient Care: Yes (Comment) Care giver support system in place?: Yes (comment) Current home services: Home RN Criminal Activity/Legal Involvement Pertinent to Current Situation/Hospitalization: No - Comment as needed  Activities of Daily Living Home Assistive Devices/Equipment: CBG Meter ADL Screening (condition at time of admission) Patient's cognitive ability adequate to safely complete daily activities?:  No Is the patient deaf or have difficulty hearing?: No Does the patient have difficulty seeing, even when wearing glasses/contacts?: No Does the patient have difficulty concentrating, remembering, or making decisions?: No Patient able to express need for assistance with ADLs?: Yes Does the patient have difficulty dressing or bathing?: No (pt reports "No", but will need help, from this writer's assessment.) Independently performs ADLs?: No Communication: Independent Dressing (OT): Needs assistance Is this a change from baseline?: Pre-admission baseline Grooming: Needs assistance Is this a change from baseline?: Pre-admission baseline Feeding: Independent Bathing: Needs assistance Is this a change from baseline?: Pre-admission baseline Toileting: Needs assistance Is this a change from baseline?: Pre-admission baseline In/Out Bed: Needs assistance Is this a change from baseline?: Pre-admission baseline Walks in Home: Needs assistance Is this a change from baseline?: Pre-admission baseline Does the patient have difficulty walking or climbing stairs?: Yes Weakness of Legs: Both Weakness of Arms/Hands: None  Permission Sought/Granted                  Emotional Assessment       Orientation: : Oriented to Self, Oriented to Place Alcohol / Substance Use: Not Applicable Psych Involvement: No (comment)  Admission diagnosis:  Sepsis (HCC) [A41.9] Diabetic ulcer of right foot associated with type 1 diabetes mellitus, with other ulcer severity, unspecified part of foot (HCC) [W09.811, L97.518] Patient Active  Problem List   Diagnosis Date Noted   Sepsis (HCC) 03/14/2023   Diabetic foot ulcer (HCC) 03/14/2023   Acute respiratory failure with hypoxia (HCC) 03/14/2023   UTI (urinary tract infection) 03/14/2023   Cellulitis of right foot 03/14/2023   Hyperglycemia 02/25/2023   DKA (diabetic ketoacidosis) (HCC) 02/13/2023   Pseudohyponatremia 02/13/2023   Elevated  beta-hydroxybutyrate 02/13/2023   Tachycardia 02/13/2023   Protein calorie malnutrition (HCC) 02/13/2023   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 [place] [address] Phone: [phone] Fax: [phone]     Social Determinants of Health (SDOH) Social History: SDOH Screenings   Food Insecurity: No Food Insecurity (03/14/2023)  Housing: Low Risk  (03/14/2023)  Transportation Needs: No Transportation Needs (03/14/2023)  Utilities: Not At Risk (03/14/2023)  Depression (PHQ2-9): Low Risk  (02/07/2023)  Recent Concern: Depression (PHQ2-9) - Medium Risk (12/18/2022)  Tobacco Use: Medium Risk (03/13/2023)   SDOH Interventions: Housing Interventions: Intervention Not Indicated   Readmission Risk Interventions    11/30/2020   11:08 AM 11/29/2020   10:33 AM  Readmission Risk Prevention Plan  Transportation Screening  Complete  PCP or Specialist Appt within 5-7 Days Complete   PCP or Specialist Appt within 3-5 Days  Complete  Home Care Screening Complete   Medication Review (RN CM) Complete   HRI or Home Care Consult  Complete  Social Work Consult for Recovery Care Planning/Counseling  Complete  Palliative Care Screening  Not Applicable  Medication Review Oceanographer)  Complete

## 2023-03-15 NOTE — Progress Notes (Signed)
PROGRESS NOTE    [name]  ZOX:[id number] DOB: [birth year] DOA: 03/13/2023 PCP: Ardith Dark, MD   Brief Narrative: [name] is a 71 y.o. female with a history of diabetes mellitus type 2, hyperlipidemia, hypertension, schizophrenia. Patient presented secondary to altered mental status in setting of hypoglycemia. On admission, she was found to meet criteria for sepsis with associated hypothermia requiring re-warming. Blood and urine cultures obtained and empiric antibiotics started. Chest x-ray concerning for pneumonia.  Presentation also complicated by worsened right foot cellulitis with associated diabetic foot ulcer.   Assessment and Plan: * Sepsis (HCC) Present on admission. Secondary to cellulitis and infected foot ulcer. Urinalysis also concerning for UTI and chest x-ray concerning for possible pneumonia. Empiric antibiotics started. Blood and urine cultures obtained. -Follow-up blood/urine cultures -Continue Vancomycin/Cefepime pending culture data  Cellulitis of right foot Secondary to infected diabetic foot ulcer. MRI obtained and is without evidence of underlying abscess or osteomyelitis. Cellulitis improving. -Continue Vancomycin/Cefepime  UTI (urinary tract infection) Urinalysis highly suggestive of a UTI. Urine culture obtained and is significant for Klebsiella Pneumoniae. Patient is on empiric antibiotics for sepsis which would cover UTI.  -Follow-up urine culture data  Acute respiratory failure with hypoxia (HCC) Secondary to pneumonia -Wean to room air as able.  Diabetic foot ulcer (HCC) Patient with good pulses. ABI normal except with DP/PT artery waveforms suspicious for underlying disease. Recommendation for CT angiography of bilateral lower extremities recommended. CRP normal. ESR mildly elevated. X-ray and MRI negative for osteomyelitis. No debridement per general surgery. -wound care consult  Dyslipidemia associated with type 2 diabetes  mellitus (HCC) Patient is managed on Novolog as an outpatient. Patient presented with hypoglycemia. Last hemoglobin A1C of 14.1% from 01/2023. SSI restarted on admission. -Continue SSI  CAP (community acquired pneumonia) Multifocal pneumonia suggested on imaging. Empiric Vancomycin and Cefepime started on admission. Blood cultures and strep pneumo/legionella ur antigen testing ordered. Blood cultures with no growth to date. -Continue Vancomycin/Cefepime for now -Follow-up blood cultures -Follow-up sputum culture  Mood disorder (HCC) -Continue Abilify, Wellbutrin, Haldol, Cogentin  Hypothermia-resolved as of 03/14/2023 In setting of sepsis, but more likely related to hypoglycemia. Patient improved with the use of an active heated air blanket. Resolved.  Hypoglycemia-resolved as of 03/15/2023 Presenting issue. Patient was found to be unresponsive with a blood glucose of 42 and responded to D10.    DVT prophylaxis: Heparin subq Code Status:   Code Status: DNR Family Communication: None at bedside Disposition Plan: Discharge home pending transition to outpatient antibiotic regimen; likely discharge in 1-2 days    Consultants:  None   Procedures:  None   Antimicrobials: Vancomycin Cefepime    Subjective: Patient reports feeling better today. Cellulitis is improving.  Objective: BP (!) 118/51 (BP Location: Left Arm)   Pulse (!) 104   Temp 100 F (37.8 C) (Oral)   Resp 19   Ht 5\' 5"  (1.651 m)   Wt 74.8 kg   SpO2 95%   BMI 27.44 kg/m   Examination:  General exam: Appears calm and comfortable Respiratory system: Rales. Respiratory effort normal. Cardiovascular system: S1 & S2 heard, RRR. No murmurs, rubs, gallops or clicks. Gastrointestinal system: Abdomen is nondistended, soft and nontender. No organomegaly or masses felt. Normal bowel sounds heard. Central nervous system: Alert and oriented. No focal neurological deficits. Musculoskeletal: No edema. No calf  tenderness Skin: No cyanosis. No rashes. Right foot ulcer with purulent drainage Psychiatry: Judgement and insight appear normal. Mood & affect appropriate.  Data Reviewed: I have personally reviewed following labs and imaging studies  CBC Lab Results  Component Value Date   WBC 4.8 03/15/2023   RBC 3.40 (L) 03/15/2023   HGB 9.0 (L) 03/15/2023   HCT 29.2 (L) 03/15/2023   MCV 85.9 03/15/2023   MCH 26.5 03/15/2023   PLT 166 03/15/2023   MCHC 30.8 03/15/2023   RDW 15.5 03/15/2023   LYMPHSABS 0.5 (L) 03/14/2023   MONOABS 0.2 03/14/2023   EOSABS 0.0 03/14/2023   BASOSABS 0.0 03/14/2023     Last metabolic panel Lab Results  Component Value Date   NA 135 03/14/2023   K 3.7 03/14/2023   CL 102 03/14/2023   CO2 25 03/14/2023   BUN 20 03/14/2023   CREATININE 0.58 03/14/2023   GLUCOSE 228 (H) 03/14/2023   GFRNONAA >60 03/14/2023   GFRAA >60 12/06/2019   CALCIUM 8.0 (L) 03/14/2023   PHOS 1.9 (L) 11/27/2020   PROT 5.8 (L) 03/14/2023   ALBUMIN 2.5 (L) 03/14/2023   BILITOT 0.1 (L) 03/14/2023   ALKPHOS 76 03/14/2023   AST 15 03/14/2023   ALT 13 03/14/2023   ANIONGAP 8 03/14/2023    GFR: Estimated Creatinine Clearance: 67.2 mL/min (by C-G formula based on SCr of 0.58 mg/dL).  Recent Results (from the past 240 hour(s))  Urine Culture     Status: Abnormal (Preliminary result)   Collection Time: 03/13/23  9:25 PM   Specimen: Urine, Clean Catch  Result Value Ref Range Status   Specimen Description   Final    URINE, CLEAN CATCH Performed at [facility], [address] [place], [place] [address]    Special Requests   Final    NONE Reflexed from [id number] Performed at [facility], [address], [place], [place] [address]    Culture (A)  Final    >=100,000 COLONIES/mL KLEBSIELLA PNEUMONIAE SUSCEPTIBILITIES TO FOLLOW Performed at [facility], [address] [place], [place] [address]    Report Status PENDING  Incomplete  Culture, blood (routine x 2)      Status: None (Preliminary result)   Collection Time: 03/13/23  9:40 PM   Specimen: BLOOD  Result Value Ref Range Status   Specimen Description BLOOD BLOOD LEFT ARM  Final   Special Requests   Final    BOTTLES DRAWN AEROBIC AND ANAEROBIC Blood Culture results may not be optimal due to an inadequate volume of blood received in culture bottles   Culture   Final    NO GROWTH 2 DAYS Performed at [facility], [address], [place], [place] [address]    Report Status PENDING  Incomplete  Culture, blood (routine x 2)     Status: None (Preliminary result)   Collection Time: 03/13/23  9:42 PM   Specimen: BLOOD  Result Value Ref Range Status   Specimen Description BLOOD BLOOD RIGHT HAND  Final   Special Requests   Final    BOTTLES DRAWN AEROBIC AND ANAEROBIC Blood Culture adequate volume   Culture   Final    NO GROWTH 2 DAYS Performed at [facility], [address] [place], [place] [address]    Report Status PENDING  Incomplete  SARS Coronavirus 2 by RT PCR (hospital order, performed in [facility] hospital lab) *cepheid single result test* Anterior Nasal Swab     Status: None   Collection Time: 03/14/23  1:45 AM   Specimen: Anterior Nasal Swab  Result Value Ref Range Status   SARS Coronavirus 2 by RT  PCR NEGATIVE NEGATIVE Final    Comment: (NOTE) SARS-CoV-2 target nucleic acids are NOT DETECTED.  The SARS-CoV-2 RNA is generally detectable in upper and lower respiratory specimens during the acute phase of infection. The lowest concentration of SARS-CoV-2 viral copies this assay can detect is 250 copies / mL. A negative result does not preclude SARS-CoV-2 infection and should not be used as the sole basis for treatment or other patient management decisions.  A negative result may occur with improper specimen collection / handling, submission of specimen other than nasopharyngeal swab, presence of viral mutation(s) within the areas targeted by this assay, and inadequate number of viral  copies (<250 copies / mL). A negative result must be combined with clinical observations, patient history, and epidemiological information.  Fact Sheet for Patients:   RoadLapTop.co.za  Fact Sheet for Healthcare Providers: http://kim-miller.com/  This test is not yet approved or  cleared by the [place] FDA and has been authorized for detection and/or diagnosis of SARS-CoV-2 by FDA under an Emergency Use Authorization (EUA).  This EUA will remain in effect (meaning this test can be used) for the duration of the COVID-19 declaration under Section 564(b)(1) of the Act, 21 U.S.C. section 360bbb-3(b)(1), unless the authorization is terminated or revoked sooner.  Performed at [facility], [address], [place], [place] [address]       Radiology Studies: US ARTERIAL ABI (SCREENING LOWER EXTREMITY)  Result Date: 03/14/2023 CLINICAL DATA:  Diabetic foot ulcer EXAM: NONINVASIVE PHYSIOLOGIC VASCULAR STUDY OF BILATERAL LOWER EXTREMITIES TECHNIQUE: Evaluation of both lower extremities were performed at rest, including calculation of ankle-brachial indices with single level pressure measurements and doppler recording. COMPARISON:  None available. FINDINGS: Right ABI:  1.29 Left ABI:  1.28 Right Lower Extremity: Monophasic posterior tibial and dorsalis pedis artery waveforms. Left Lower Extremity: Monophasic posterior tibial and dorsalis pedis artery waveforms. IMPRESSION: Although ankle-brachial indices are within normal limits, bilateral monophasic dorsalis pedis and posterior tibial artery waveforms are suspicious for underlying arterial occlusive disease. Further evaluation with CT angiography of the lower extremities would be beneficial. Electronically Signed   By: Acquanetta Belling M.D.   On: 03/14/2023 10:58   MR FOOT RIGHT W WO CONTRAST  Result Date: 03/14/2023 CLINICAL DATA:  Right foot pain.  Open wound. EXAM: MRI OF THE RIGHT FOREFOOT WITHOUT  AND WITH CONTRAST TECHNIQUE: Multiplanar, multisequence MR imaging of the right foot was performed before and after the administration of intravenous contrast. CONTRAST:  7mL GADAVIST GADOBUTROL 1 MMOL/ML IV SOLN COMPARISON:  Radiographs 03/14/2023 FINDINGS: There is an open wound noted on the plantar aspect of the forefoot between the first and second toes. No subcutaneous abscess is identified. There is diffuse severe cellulitis involving the entire foot. Mild diffuse myofasciitis but no findings for pyomyositis. No MR findings suspicious for septic arthritis or osteomyelitis. The major tendons and ligaments appear intact. IMPRESSION: 1. Open wound on the plantar aspect of the forefoot between the first and second toes. No subcutaneous abscess. 2. Diffuse severe cellulitis involving the entire foot. Mild diffuse myofasciitis but no findings for pyomyositis. 3. No MR findings suspicious for septic arthritis or osteomyelitis. Electronically Signed   By: Rudie Meyer M.D.   On: 03/14/2023 09:43   DG Foot Complete Right  Result Date: 03/14/2023 CLINICAL DATA:  Foot ulcer EXAM: RIGHT FOOT COMPLETE - 3+ VIEW COMPARISON:  None Available. FINDINGS: There is no evidence of fracture or dislocation. There is no evidence of arthropathy or other focal bone abnormality. Soft tissues are  unremarkable. IMPRESSION: Negative. Electronically Signed   By: Charlett Nose M.D.   On: 03/14/2023 00:48   DG Chest Port 1 View  Result Date: 03/13/2023 CLINICAL DATA:  Unresponsive. EXAM: PORTABLE CHEST 1 VIEW COMPARISON:  February 25, 2023 FINDINGS: The heart size and mediastinal contours are within normal limits. Mild to moderate severity diffusely increased interstitial lung markings are seen. This is mildly increased in severity when compared to the prior study. Mild superimposed areas of atelectasis and/or infiltrate are noted within the lateral aspect of the right upper lobe and left lung base. There is no evidence of a pleural  effusion or pneumothorax. The visualized skeletal structures are unremarkable. IMPRESSION: Mild right upper lobe and left basilar airspace disease with a mild superimposed component of interstitial edema. Electronically Signed   By: Aram Candela M.D.   On: 03/13/2023 22:03      LOS: 1 day    Jacquelin Hawking, MD [facility] 03/15/2023, 11:45 AM   If 7PM-7AM, please contact night-coverage [email or web address]

## 2023-03-15 NOTE — TOC Progression Note (Signed)
Transition of Care [facility]) - Progression Note    Patient Details  Name: [name] MRN: [id number] Date of Birth: June 09, 1951  Transition of Care [facility]) CM/SW Contact  Elliot Gault, LCSW Phone Number: 03/15/2023, 3:49 PM  Clinical Narrative:     Pt and family now requesting SNF referrals. CMS provider options reviewed. Will refer as requested. Updated MD. Will follow up when bed offers available.  Expected Discharge Plan: Home w Home Health Services Barriers to Discharge: Continued Medical Work up  Expected Discharge Plan and Services In-house Referral: Clinical Social Work     Living arrangements for the past 2 months: Single Family Home                                       Social Determinants of Health (SDOH) Interventions SDOH Screenings   Food Insecurity: No Food Insecurity (03/14/2023)  Housing: Low Risk  (03/14/2023)  Transportation Needs: No Transportation Needs (03/14/2023)  Utilities: Not At Risk (03/14/2023)  Depression (PHQ2-9): Low Risk  (02/07/2023)  Recent Concern: Depression (PHQ2-9) - Medium Risk (12/18/2022)  Tobacco Use: Medium Risk (03/13/2023)    Readmission Risk Interventions    11/30/2020   11:08 AM 11/29/2020   10:33 AM  Readmission Risk Prevention Plan  Transportation Screening  Complete  PCP or Specialist Appt within 5-7 Days Complete   PCP or Specialist Appt within 3-5 Days  Complete  Home Care Screening Complete   Medication Review (RN CM) Complete   HRI or Home Care Consult  Complete  Social Work Consult for Recovery Care Planning/Counseling  Complete  Palliative Care Screening  Not Applicable  Medication Review Oceanographer)  Complete

## 2023-03-15 NOTE — Consult Note (Addendum)
WOC Nurse Consult Note: Reason for Consult: Consult requested for right foot wound.  Performed remotely after review of progress notes and photos in the EMR.  Pt is already on systemic antibiotics to treat cellulitis. Surgical team assessed the location and will not need to go to the OR. Full thickness wound to right plantar foot near great and 2nd toe; yellow slough, mod amt yellow drainage.  MRI does not indicate osteomyelitis.  Dressing procedure/placement/frequency: Topical treatment orders provided for bedside nurses to perform as follows to assist with removal of nonviable tissue: Apply Medihoney to right plantar foot wound Q day, then cover with foam dressing. Change foam dressing Q 3 days or PRN soiling.  Please re-consult if further assistance is needed.  Thank-you,  Cammie Mcgee MSN, RN, CWOCN, [place], CNS [phone]

## 2023-03-15 NOTE — Progress Notes (Signed)
Dsg change done to right diabetic foot ulcer. Pt tolerated dsg change with no problems. Pt also received a bath at time of dsg change.

## 2023-03-15 NOTE — Inpatient Diabetes Management (Addendum)
Inpatient Diabetes Program Recommendations  AACE/ADA: New Consensus Statement on Inpatient Glycemic Control   Target Ranges:  Prepandial:   less than 140 mg/dL      Peak postprandial:   less than 180 mg/dL (1-2 hours)      Critically ill patients:  140 - 180 mg/dL    Latest Reference Range & Units 03/15/23 09:27  Glucose-Capillary 70 - 99 mg/dL 829 (H)    Latest Reference Range & Units 03/14/23 07:42 03/14/23 11:55 03/14/23 18:18 03/14/23 21:40  Glucose-Capillary 70 - 99 mg/dL 562 (H) 130 (H) 865 (H) 143 (H)    Latest Reference Range & Units 01/14/23 11:30 02/13/23 09:21  Hemoglobin A1C 4.8 - 5.6 % 13.6 ! 14.1 (H)   Review of Glycemic Control  Diabetes history: DM2 Outpatient Diabetes medications: 70/30 25 units BID Current orders for Inpatient glycemic control: Novolog 0-5 units TID with meals, Novolog 0-5 units QHS  Inpatient Diabetes Program Recommendations:    Insulin: CBG 244 mg/dl today at 7:84 am. May want to consider ordering Semglee 10 units Q24H and change Novolog correction to 0-9 units TID with meals.  HbgA1C:  A1C 14.1% on 02/13/23 indicating an average glucose of 358 mg/dl over the past 2-3 months.  Outpatient: Please provide Rx for Baqsimi [phone]) at discharge. Also, please clarify which insulins and dosages patient needs to take at home and provide Rx.  If patient does not have medication coverage with insurance, may need to prescribe Novolin 70/30 insulin pens (and inform patient it is $43 per box of 5 insulin pens at [facility]).  NOTE: Patient admitted on 03/14/23 with noted hypoglycemia, sepsis, UTI, acute respiratory failure, diabetic foot ulcer, dyslipidemia, and pneumonia.  Patient was recently inpatient 02/25/23-03/02/23 and at discharge insulin was changed from [place] to 70/30 insulin. Inpatient diabetes coordinator spoke with patient on 02/26/23 and on 02/13/23 (during prior admission). Per H&P on 03/14/23, "Today she was found unresponsive, questionably  outside, and family reported to nursing staff that they gave her her shot of insulin when they found her this way. Patient herself reports that they do not normally check her sugar before they administer insulin. She takes her insulin every day per her report, and she checks her glucose approximately once per week. Patient reports that she is taking [place] and NovoLog at home. She is not sure of the dosages." Will plan to speak with patient today.  Addendum 03/15/23@14 :40- Spoke to patient at bedside about DM. Patient states that she sees PCP and Dr. Talmage Nap for DM management but has not seen PCP in a while. Patient reports that she is not checking glucose very often at home but she has everything to check glucose. Patient reports that she gives her own insulin injections using insulin pens. Patient states she has been taking [place] and Novolog since being discharged from the hospital on 03/02/23. Patient is not sure how much insulin she takes but states that her boyfriend usually dials up the dose for her and she gives it to herself.  Inquired if the Novolog insulin was Novolog 70/30 or not and patient states she is not sure but she states she got the Novolog after she was discharged on 03/02/23 (per discharge summary was to stop [place] and switch to Novolog 70/30 25 units BID). Patient states she does not remember about what happened during the hypoglycemic episode that brought her in. She states that from what her family has told her, her boyfriend could not wake her up so  he gave her an insulin injection but not sure which insulin or how much. Patient states she can usually tell when her glucose is low; inquired about treatment for low glucose and she stated "if my sugar is low, I take a shot of insulin".  Discussed hypoglycemia, symptoms, and treatment. Explained that it is important to check glucose if she has symptoms of hypoglycemia and if glucose is less than 70 mg/dl then she needs to eat or  drink 15 grams of carbs to get glucose back up then wait 15 mins and recheck glucose to make sure it is over 70 mg/dl. Patient does not know what glucagon is. Discussed glucagon and explained that Baqsimi is a nasal glucagon that could be used for severe hypoglycemia. Patient states that she would like to get it prescribed at discharge and she could tell her boyfriend how to use it. Informed patient that Living Well with DM book would be ordered and encouraged patient to read the book and have boyfriend read the book as well to increase knowledge of DM management. Patient states that she doesn't have much insulin left at home but reports she still has a pen of [place] and Novolog. Discussed Tresiba insulin and Novolog 70/30. Explained that if she is taking Novolog 70/30 insulin she should not be taking the [place] insulin as well since the 70/30 provides basal and meal coverage insulin.  Asked patient to check glucose at least 2 times a day, to call PCP and make a follow up appointment as soon as possible, take glucometer to appointments for provider to review, and be sure to pay close attention to discharge summary and take DM medications as prescribed. Patient verbalized understanding of information and she has no questions at this time. Called [facility] to inquire about insulins she has gotten filled since last discharge on 03/02/23. The pharmacist states that patient and her boyfriend just came in to [facility] 2 days ago to try to get more insulin and other medications. She notes that when they ran patient's insurance it came back as terminated. Not sure if patient recently changed plans or not. She states that patient and her boyfriend were very confused about all the insurance and medication information given. Patient could not pick up any insulin b/c it was over expensive; she noted it was $200 for the Novolog 70/30. Sent chat message to Elliot Gault, LCSW to see if insurance can be verified and if  meds to bed could be done to ensure patient gets correct insulin at discharge.   Thanks, Orlando Penner, RN, MSN, CDCES Diabetes Coordinator Inpatient Diabetes Program [phone] (Team Pager from 8am to 5pm)

## 2023-03-15 NOTE — Progress Notes (Signed)
RE: [name] Date Of Birth: 1951/08/22 Date: 03/15/2023  MUST ID: [id number]  To Whom It May Concern:   Please be advised that the above name patient will require a short-term nursing home stay - anticipated 30 days or less rehabilitation and strengthening. The plan is for return home.

## 2023-03-16 DIAGNOSIS — R6520 Severe sepsis without septic shock: Secondary | ICD-10-CM | POA: Diagnosis not present

## 2023-03-16 DIAGNOSIS — A419 Sepsis, unspecified organism: Secondary | ICD-10-CM | POA: Diagnosis not present

## 2023-03-16 DIAGNOSIS — J189 Pneumonia, unspecified organism: Secondary | ICD-10-CM | POA: Diagnosis not present

## 2023-03-16 DIAGNOSIS — E11621 Type 2 diabetes mellitus with foot ulcer: Secondary | ICD-10-CM | POA: Diagnosis not present

## 2023-03-16 DIAGNOSIS — R509 Fever, unspecified: Secondary | ICD-10-CM | POA: Insufficient documentation

## 2023-03-16 LAB — CBC
HCT: 27.8 % — ABNORMAL LOW (ref 36.0–46.0)
Hemoglobin: 8.8 g/dL — ABNORMAL LOW (ref 12.0–15.0)
MCH: 26.9 pg (ref 26.0–34.0)
MCHC: 31.7 g/dL (ref 30.0–36.0)
MCV: 85 fL (ref 80.0–100.0)
Platelets: 156 10*3/uL (ref 150–400)
RBC: 3.27 MIL/uL — ABNORMAL LOW (ref 3.87–5.11)
RDW: 15.4 % (ref 11.5–15.5)
WBC: 4.8 10*3/uL (ref 4.0–10.5)
nRBC: 0 % (ref 0.0–0.2)

## 2023-03-16 LAB — GLUCOSE, CAPILLARY
Glucose-Capillary: 275 mg/dL — ABNORMAL HIGH (ref 70–99)
Glucose-Capillary: 306 mg/dL — ABNORMAL HIGH (ref 70–99)
Glucose-Capillary: 305 mg/dL — ABNORMAL HIGH (ref 70–99)
Glucose-Capillary: 362 mg/dL — ABNORMAL HIGH (ref 70–99)

## 2023-03-16 LAB — CULTURE, BLOOD (ROUTINE X 2): Special Requests: ADEQUATE

## 2023-03-16 LAB — URINE CULTURE

## 2023-03-16 LAB — PROCALCITONIN: Procalcitonin: 0.21 ng/mL

## 2023-03-16 MED ORDER — SODIUM CHLORIDE 0.9 % IV SOLN
1.0000 g | Freq: Three times a day (TID) | INTRAVENOUS | Status: DC
  Administered 2023-03-16: 1 g via INTRAVENOUS
  Filled 2023-03-16: qty 20

## 2023-03-16 MED ORDER — DIPHENHYDRAMINE HCL 25 MG PO CAPS: 25.0000 mg | ORAL_CAPSULE | Freq: Four times a day (QID) | ORAL | Status: DC | PRN

## 2023-03-16 MED ORDER — SODIUM CHLORIDE 0.9 % IV SOLN
1.0000 g | Freq: Three times a day (TID) | INTRAVENOUS | Status: AC
  Administered 2023-03-16 – 2023-03-21 (×15): 1 g via INTRAVENOUS
  Filled 2023-03-16 (×15): qty 20

## 2023-03-16 MED ORDER — BENZONATATE 100 MG PO CAPS
200.0000 mg | ORAL_CAPSULE | Freq: Two times a day (BID) | ORAL | Status: DC | PRN
  Administered 2023-03-16: 200 mg via ORAL
  Filled 2023-03-16: qty 2

## 2023-03-16 NOTE — Assessment & Plan Note (Addendum)
Recurrent fever. Tmax of 102 F overnight. No associated symptoms per patient. Urine culture significant for UTI, however no presence of bacteremia; Klebsiella is identified as ESBL. RVP ordered and still pending. Procalcitonin is trending downward. -Trend fever curve -Follow-up RVP

## 2023-03-16 NOTE — Progress Notes (Signed)
Pt has intermittent periods of confusion, otherwise A&O x 3 (unaware to time or year). Pt ambulated with one assist, assisted with shower and is sitting in chair without complaints. Pt was on room air during shower, O2 sat dropped to 80%, quickly recovered with 3 L of O2, pt states she does not normally wear O2 at home. R foot cleansed and foam with medihoney applied to wound, area remains swollen, red, and warm to touch. Temperature was 100.2 during morning VS, tylenol administered. Pt resting comfortably at this time, care is ongoing.

## 2023-03-16 NOTE — Progress Notes (Signed)
PROGRESS NOTE    [name]  ZOX:[id number] DOB: 1951/03/17 DOA: 03/13/2023 PCP: Ardith Dark, MD   Brief Narrative: [name] is a 72 y.o. female with a history of diabetes mellitus type 2, hyperlipidemia, hypertension, schizophrenia. Patient presented secondary to altered mental status in setting of hypoglycemia. On admission, she was found to meet criteria for sepsis with associated hypothermia requiring re-warming. Blood and urine cultures obtained and empiric antibiotics started. Chest x-ray concerning for pneumonia.  Presentation also complicated by worsened right foot cellulitis with associated diabetic foot ulcer.   Assessment and Plan: * Sepsis (HCC) Present on admission. Secondary to cellulitis and infected foot ulcer. Urinalysis also concerning for UTI and chest x-ray concerning for possible pneumonia. Empiric antibiotics started. Blood and urine cultures obtained. Urine culture significant for ESBL Klebsiella infection and blood cultures are no growth to date. -Discontinue Cefepime and start meropenem  Fever Recurrent fever. Tmax of 102 F overnight. No associated symptoms per patient. Urine culture significant for UTI, however no presence of bacteremia; Klebsiella is identified as ESBL. RVP ordered and still pending. Procalcitonin is trending downward. -Trend fever curve -Follow-up RVP  Cellulitis of right foot Secondary to infected diabetic foot ulcer. MRI obtained and is without evidence of underlying abscess or osteomyelitis. Cellulitis improving. -Discontinue Vancomycin and Cefepime and start meropenem for associated ESBL Klebsiella pneumoniae UTI  UTI (urinary tract infection) Urinalysis highly suggestive of a UTI. Urine culture obtained and is significant for ESBL Klebsiella Pneumoniae. -Change Cefepime to meropenem and treat for 5 days total  Acute respiratory failure with hypoxia (HCC) Secondary to pneumonia -Wean to room air as  able.  Diabetic foot ulcer (HCC) Patient with good pulses. ABI normal except with DP/PT artery waveforms suspicious for underlying disease. Recommendation for CT angiography of bilateral lower extremities recommended. CRP normal. ESR mildly elevated. X-ray and MRI negative for osteomyelitis. No debridement per general surgery. -Wound care recommendations (4/26): Topical treatment orders provided for bedside nurses to perform as follows to assist with removal of nonviable tissue: Apply Medihoney to right plantar foot wound Q day, then cover with foam dressing. Change foam dressing Q 3 days or PRN soiling.   Dyslipidemia associated with type 2 diabetes mellitus (HCC) Patient is managed on Novolog as an outpatient. Patient presented with hypoglycemia. Last hemoglobin A1C of 14.1% from 01/2023. SSI restarted on admission. -Continue SSI  CAP (community acquired pneumonia) Multifocal pneumonia suggested on imaging. Empiric Vancomycin and Cefepime started on admission. Blood cultures and strep pneumo/legionella ur antigen testing ordered. Blood cultures with no growth to date. -Continue Vancomycin/Cefepime for now -Follow-up blood cultures -Follow-up sputum culture  Mood disorder (HCC) -Continue Abilify, Wellbutrin, Haldol, Cogentin  Hypothermia-resolved as of 03/14/2023 In setting of sepsis, but more likely related to hypoglycemia. Patient improved with the use of an active heated air blanket. Resolved.  Hypoglycemia-resolved as of 03/15/2023 Presenting issue. Patient was found to be unresponsive with a blood glucose of 42 and responded to D10.    DVT prophylaxis: Heparin subq Code Status:   Code Status: DNR Family Communication: None at bedside Disposition Plan: Discharge likely to SNF pending transition to outpatient antibiotic regimen; likely discharge in 2 days    Consultants:  None   Procedures:  None   Antimicrobials: Vancomycin Cefepime Meropenem   Subjective: No concerns  today. No URI symptoms. No chest pain or dyspnea. No diarrhea.  Objective: BP 122/60 (BP Location: Left Arm)   Pulse (!) 102   Temp 100.2 F (37.9 C) (  Oral)   Resp 18   Ht 5\' 5"  (1.651 m)   Wt 74.8 kg   SpO2 93%   BMI 27.44 kg/m   Examination:  General exam: Appears calm and comfortable Respiratory system: Clear to auscultation. Respiratory effort normal. Cardiovascular system: S1 & S2 heard, RRR. Gastrointestinal system: Abdomen is nondistended, soft and mildly tender. No organomegaly or masses felt. Normal bowel sounds heard. Central nervous system: Alert and oriented. No focal neurological deficits. Musculoskeletal: No calf tenderness Skin: No cyanosis. No rashes Psychiatry: Judgement and insight appear normal. Mood & affect appropriate.   Data Reviewed: I have personally reviewed following labs and imaging studies  CBC Lab Results  Component Value Date   WBC 4.8 03/16/2023   RBC 3.27 (L) 03/16/2023   HGB 8.8 (L) 03/16/2023   HCT 27.8 (L) 03/16/2023   MCV 85.0 03/16/2023   MCH 26.9 03/16/2023   PLT 156 03/16/2023   MCHC 31.7 03/16/2023   RDW 15.4 03/16/2023   LYMPHSABS 0.5 (L) 03/14/2023   MONOABS 0.2 03/14/2023   EOSABS 0.0 03/14/2023   BASOSABS 0.0 03/14/2023     Last metabolic panel Lab Results  Component Value Date   NA 135 03/14/2023   K 3.7 03/14/2023   CL 102 03/14/2023   CO2 25 03/14/2023   BUN 20 03/14/2023   CREATININE 0.58 03/14/2023   GLUCOSE 228 (H) 03/14/2023   GFRNONAA >60 03/14/2023   GFRAA >60 12/06/2019   CALCIUM 8.0 (L) 03/14/2023   PHOS 1.9 (L) 11/27/2020   PROT 5.8 (L) 03/14/2023   ALBUMIN 2.5 (L) 03/14/2023   BILITOT 0.1 (L) 03/14/2023   ALKPHOS 76 03/14/2023   AST 15 03/14/2023   ALT 13 03/14/2023   ANIONGAP 8 03/14/2023    GFR: Estimated Creatinine Clearance: 67.2 mL/min (by C-G formula based on SCr of 0.58 mg/dL).  Recent Results (from the past 240 hour(s))  Urine Culture     Status: Abnormal   Collection Time:  03/13/23  9:25 PM   Specimen: Urine, Clean Catch  Result Value Ref Range Status   Specimen Description   Final    URINE, CLEAN CATCH Performed at [facility], [address], [place], [place] [address]    Special Requests   Final    NONE Reflexed from [id number] Performed at [facility], [address] [place], [place] [address]    Culture (A)  Final    >=100,000 COLONIES/mL KLEBSIELLA PNEUMONIAE Confirmed Extended Spectrum Beta-Lactamase Producer (ESBL).  In bloodstream infections from ESBL organisms, carbapenems are preferred over piperacillin/tazobactam. They are shown to have a lower risk of mortality.    Report Status 03/16/2023 FINAL  Final   Organism ID, Bacteria KLEBSIELLA PNEUMONIAE (A)  Final      Susceptibility   Klebsiella pneumoniae - MIC*    AMPICILLIN >=32 RESISTANT Resistant     CEFAZOLIN >=64 RESISTANT Resistant     CEFEPIME >=32 RESISTANT Resistant     CEFTRIAXONE >=64 RESISTANT Resistant     CIPROFLOXACIN 1 RESISTANT Resistant     GENTAMICIN >=16 RESISTANT Resistant     IMIPENEM <=0.25 SENSITIVE Sensitive     NITROFURANTOIN 64 INTERMEDIATE Intermediate     TRIMETH/SULFA >=320 RESISTANT Resistant     AMPICILLIN/SULBACTAM >=32 RESISTANT Resistant     PIP/TAZO 16 SENSITIVE Sensitive     * >=100,000 COLONIES/mL KLEBSIELLA PNEUMONIAE  Culture, blood (routine x 2)     Status: None (Preliminary result)   Collection Time: 03/13/23  9:40 PM   Specimen: BLOOD  Result Value Ref Range Status   Specimen Description BLOOD BLOOD LEFT ARM  Final   Special Requests   Final    BOTTLES DRAWN AEROBIC AND ANAEROBIC Blood Culture results may not be optimal due to an inadequate volume of blood received in culture bottles   Culture   Final    NO GROWTH 3 DAYS Performed at [facility], [address] [place], [place] [address]    Report Status PENDING  Incomplete  Culture, blood (routine x 2)     Status: None (Preliminary result)   Collection Time: 03/13/23  9:42 PM    Specimen: BLOOD  Result Value Ref Range Status   Specimen Description BLOOD BLOOD RIGHT HAND  Final   Special Requests   Final    BOTTLES DRAWN AEROBIC AND ANAEROBIC Blood Culture adequate volume   Culture   Final    NO GROWTH 3 DAYS Performed at [facility], [address], [place], [place] [address]    Report Status PENDING  Incomplete  SARS Coronavirus 2 by RT PCR (hospital order, performed in [facility] hospital lab) *cepheid single result test* Anterior Nasal Swab     Status: None   Collection Time: 03/14/23  1:45 AM   Specimen: Anterior Nasal Swab  Result Value Ref Range Status   SARS Coronavirus 2 by RT PCR NEGATIVE NEGATIVE Final    Comment: (NOTE) SARS-CoV-2 target nucleic acids are NOT DETECTED.  The SARS-CoV-2 RNA is generally detectable in upper and lower respiratory specimens during the acute phase of infection. The lowest concentration of SARS-CoV-2 viral copies this assay can detect is 250 copies / mL. A negative result does not preclude SARS-CoV-2 infection and should not be used as the sole basis for treatment or other patient management decisions.  A negative result may occur with improper specimen collection / handling, submission of specimen other than nasopharyngeal swab, presence of viral mutation(s) within the areas targeted by this assay, and inadequate number of viral copies (<250 copies / mL). A negative result must be combined with clinical observations, patient history, and epidemiological information.  Fact Sheet for Patients:   RoadLapTop.co.za  Fact Sheet for Healthcare Providers: http://kim-miller.com/  This test is not yet approved or  cleared by the [place] FDA and has been authorized for detection and/or diagnosis of SARS-CoV-2 by FDA under an Emergency Use Authorization (EUA).  This EUA will remain in effect (meaning this test can be used) for the duration of the COVID-19 declaration under  Section 564(b)(1) of the Act, 21 U.S.C. section 360bbb-3(b)(1), unless the authorization is terminated or revoked sooner.  Performed at [facility], [address], [place], [place] [address]       Radiology Studies: US ARTERIAL ABI (SCREENING LOWER EXTREMITY)  Result Date: 03/14/2023 CLINICAL DATA:  Diabetic foot ulcer EXAM: NONINVASIVE PHYSIOLOGIC VASCULAR STUDY OF BILATERAL LOWER EXTREMITIES TECHNIQUE: Evaluation of both lower extremities were performed at rest, including calculation of ankle-brachial indices with single level pressure measurements and doppler recording. COMPARISON:  None available. FINDINGS: Right ABI:  1.29 Left ABI:  1.28 Right Lower Extremity: Monophasic posterior tibial and dorsalis pedis artery waveforms. Left Lower Extremity: Monophasic posterior tibial and dorsalis pedis artery waveforms. IMPRESSION: Although ankle-brachial indices are within normal limits, bilateral monophasic dorsalis pedis and posterior tibial artery waveforms are suspicious for underlying arterial occlusive disease. Further evaluation with CT angiography of the lower extremities would be beneficial. Electronically Signed   By: Acquanetta Belling M.D.   On: 03/14/2023 10:58  LOS: 2 days    Jacquelin Hawking, MD [facility] 03/16/2023, 9:58 AM   If 7PM-7AM, please contact night-coverage [email or web address]

## 2023-03-16 NOTE — Inpatient Diabetes Management (Signed)
Inpatient Diabetes Program Recommendations  AACE/ADA: New Consensus Statement on Inpatient Glycemic Control   Target Ranges:  Prepandial:   less than 140 mg/dL      Peak postprandial:   less than 180 mg/dL (1-2 hours)      Critically ill patients:  140 - 180 mg/dL    Latest Reference Range & Units 03/15/23 09:27 03/15/23 12:58 03/15/23 16:37 03/15/23 21:16 03/16/23 08:29  Glucose-Capillary 70 - 99 mg/dL 161 (H) 096 (H) 045 (H) 163 (H) 275 (H)   Review of Glycemic Control  Diabetes history: DM2 Outpatient Diabetes medications: 70/30 25 units BID Current orders for Inpatient glycemic control: Novolog 0-5 units TID with meals, Novolog 0-5 units QHS   Inpatient Diabetes Program Recommendations:     Insulin: CBG 275 mg/dl today at 4:09 am. May want to consider ordering Semglee 10 units Q24H and change Novolog correction to 0-9 units TID with meals.   NOTE: Noted consult for Diabetes Coordinator. Diabetes Coordinator is not on campus over the weekend but available by pager from 8am to 5pm for questions or concerns.   Thanks, Orlando Penner, RN, MSN, CDCES Diabetes Coordinator Inpatient Diabetes Program [phone] (Team Pager from 8am to 5pm)

## 2023-03-16 NOTE — Progress Notes (Signed)
Pharmacy Antibiotic Note  [name] is a 72 y.o. female admitted on 03/13/2023 with UTI.  Pharmacy has been consulted for [facility] for 5 daysdosing.  Plan: Merrem 1gm IV q8h x 5 days F/U cxs and clinical progress Monitor V/S, labs  Height: 5\' 5"  (165.1 cm) Weight: 74.8 kg (164 lb 14.5 oz) IBW/kg (Calculated) : 57  Temp (24hrs), Avg:101 F (38.3 C), Min:100.2 F (37.9 C), Max:102.1 F (38.9 C)  Recent Labs  Lab 03/12/23 1031 03/13/23 2142 03/13/23 2353 03/14/23 0332 03/14/23 0523 03/15/23 0439 03/16/23 0500  WBC 4.8 5.3  --   --  4.7 4.8 4.8  CREATININE 0.66 0.58  --  0.58  --   --   --   LATICACIDVEN  --  2.3* 1.5  --   --   --   --     Estimated Creatinine Clearance: 67.2 mL/min (by C-G formula based on SCr of 0.58 mg/dL).    Allergies  Allergen Reactions   Penicillins Rash and Hives    Did it involve swelling of the face/tongue/throat, SOB, or low BP? Unknown Did it involve sudden or severe rash/hives, skin peeling, or any reaction on the inside of your mouth or nose? Unknown Did you need to seek medical attention at a hospital or doctor's office? Unknown When did it last happen?       If all above answers are "NO", may proceed with cephalosporin use.  Did it involve swelling of the face/tongue/throat, SOB, or low BP? Unknown Did it involve sudden or severe rash/hives, skin peeling, or any reaction on the inside of your mouth or nose? Unknown Did you need to seek medical attention at a hospital or doctor's office? Unknown When did it last happen?       If all above answers are "NO", may proceed with cephalosporin use.    Antimicrobials this admission: Merrem 4/27>> Vancomycin 4/25 >> 4/27 cefepime 4/25 >> 4/27  Microbiology results: 4/24 Bcx: ngtd 4/24 Ucx: > 100K CFU/ml K. Pneumo ESBL s- imipenem  Thank you for allowing pharmacy to be a part of this patient's care.  Elder Cyphers, BS Pharm D, BCPS Clinical Pharmacist 03/16/2023 11:23 AM

## 2023-03-17 DIAGNOSIS — A419 Sepsis, unspecified organism: Secondary | ICD-10-CM | POA: Diagnosis not present

## 2023-03-17 DIAGNOSIS — E10621 Type 1 diabetes mellitus with foot ulcer: Secondary | ICD-10-CM | POA: Diagnosis not present

## 2023-03-17 DIAGNOSIS — L97518 Non-pressure chronic ulcer of other part of right foot with other specified severity: Secondary | ICD-10-CM | POA: Diagnosis not present

## 2023-03-17 DIAGNOSIS — R6520 Severe sepsis without septic shock: Secondary | ICD-10-CM | POA: Diagnosis not present

## 2023-03-17 DIAGNOSIS — D649 Anemia, unspecified: Secondary | ICD-10-CM | POA: Insufficient documentation

## 2023-03-17 DIAGNOSIS — J189 Pneumonia, unspecified organism: Secondary | ICD-10-CM | POA: Diagnosis not present

## 2023-03-17 DIAGNOSIS — E11621 Type 2 diabetes mellitus with foot ulcer: Secondary | ICD-10-CM | POA: Diagnosis not present

## 2023-03-17 LAB — CBC
HCT: 29.9 % — ABNORMAL LOW (ref 36.0–46.0)
Hemoglobin: 9.2 g/dL — ABNORMAL LOW (ref 12.0–15.0)
MCH: 26.3 pg (ref 26.0–34.0)
MCHC: 30.8 g/dL (ref 30.0–36.0)
MCV: 85.4 fL (ref 80.0–100.0)
Platelets: 160 K/uL (ref 150–400)
RBC: 3.5 MIL/uL — ABNORMAL LOW (ref 3.87–5.11)
RDW: 15.2 % (ref 11.5–15.5)
WBC: 3.1 K/uL — ABNORMAL LOW (ref 4.0–10.5)
nRBC: 0 % (ref 0.0–0.2)

## 2023-03-17 LAB — FOLATE: Folate: 14.1 ng/mL

## 2023-03-17 LAB — FERRITIN: Ferritin: 150 ng/mL (ref 11–307)

## 2023-03-17 LAB — BASIC METABOLIC PANEL WITH GFR
Anion gap: 9 (ref 5–15)
BUN: 19 mg/dL (ref 8–23)
CO2: 20 mmol/L — ABNORMAL LOW (ref 22–32)
Calcium: 8.2 mg/dL — ABNORMAL LOW (ref 8.9–10.3)
Chloride: 103 mmol/L (ref 98–111)
Creatinine, Ser: 0.64 mg/dL (ref 0.44–1.00)
GFR, Estimated: >60 mL/min
Glucose, Bld: 359 mg/dL — ABNORMAL HIGH (ref 70–99)
Potassium: 4.1 mmol/L (ref 3.5–5.1)
Sodium: 132 mmol/L — ABNORMAL LOW (ref 135–145)

## 2023-03-17 LAB — CULTURE, BLOOD (ROUTINE X 2)

## 2023-03-17 LAB — IRON AND TIBC
Iron: 15 ug/dL — ABNORMAL LOW (ref 28–170)
Saturation Ratios: 8 % — ABNORMAL LOW (ref 10.4–31.8)
TIBC: 192 ug/dL — ABNORMAL LOW (ref 250–450)
UIBC: 177 ug/dL

## 2023-03-17 LAB — GLUCOSE, CAPILLARY
Glucose-Capillary: 358 mg/dL — ABNORMAL HIGH (ref 70–99)
Glucose-Capillary: 277 mg/dL — ABNORMAL HIGH (ref 70–99)
Glucose-Capillary: 278 mg/dL — ABNORMAL HIGH (ref 70–99)
Glucose-Capillary: 247 mg/dL — ABNORMAL HIGH (ref 70–99)

## 2023-03-17 LAB — RETICULOCYTES
Immature Retic Fract: 23.2 % — ABNORMAL HIGH (ref 2.3–15.9)
RBC.: 3.48 MIL/uL — ABNORMAL LOW (ref 3.87–5.11)
Retic Count, Absolute: 72.4 10*3/uL (ref 19.0–186.0)
Retic Ct Pct: 2.1 % (ref 0.4–3.1)

## 2023-03-17 LAB — VITAMIN B12: Vitamin B-12: 206 pg/mL (ref 180–914)

## 2023-03-17 MED ORDER — INSULIN GLARGINE-YFGN 100 UNIT/ML ~~LOC~~ SOLN
15.0000 [IU] | Freq: Every day | SUBCUTANEOUS | Status: DC
  Administered 2023-03-17: 15 [IU] via SUBCUTANEOUS
  Filled 2023-03-17 (×3): qty 0.15

## 2023-03-17 MED ORDER — HALOPERIDOL LACTATE 5 MG/ML IJ SOLN: 2.0000 mg | Freq: Once | INTRAMUSCULAR | Status: DC

## 2023-03-17 MED ORDER — INSULIN ASPART 100 UNIT/ML IJ SOLN
0.0000 [IU] | Freq: Three times a day (TID) | INTRAMUSCULAR | Status: DC
  Administered 2023-03-17 (×2): 8 [IU] via SUBCUTANEOUS
  Administered 2023-03-18: 11 [IU] via SUBCUTANEOUS
  Administered 2023-03-18: 15 [IU] via SUBCUTANEOUS
  Administered 2023-03-18: 3 [IU] via SUBCUTANEOUS
  Administered 2023-03-19: 8 [IU] via SUBCUTANEOUS
  Administered 2023-03-19: 15 [IU] via SUBCUTANEOUS
  Administered 2023-03-20 – 2023-03-21 (×3): 5 [IU] via SUBCUTANEOUS
  Administered 2023-03-21: 11 [IU] via SUBCUTANEOUS

## 2023-03-17 NOTE — Progress Notes (Signed)
  Subjective: Patient resting comfortably.  Does seem somewhat confused.  Objective: Vital signs in last 24 hours: Temp:  [98.7 F (37.1 C)-100.2 F (37.9 C)] 99.9 F (37.7 C) (04/28 0249) Pulse Rate:  [82-98] 98 (04/28 0249) Resp:  [18] 18 (04/28 0249) BP: (111-137)/(46-63) 135/63 (04/28 0249) SpO2:  [95 %-100 %] 95 % (04/28 0249) Last BM Date : 03/15/23  Intake/Output from previous day: 04/27 0701 - 04/28 0700 In: 337 [P.O.:237; IV Piggyback:100] Out: 351 [Urine:351] Intake/Output this shift: No intake/output data recorded.  General appearance: cooperative and no distress Extremities: Right foot with minimal edema present.  Serous drainage noted at base of first and second toes.  No discrete abscess cavity appreciated.  No significant cyanosis of digits noted.  Lab Results:  Recent Labs    03/16/23 0500 03/17/23 0439  WBC 4.8 3.1*  HGB 8.8* 9.2*  HCT 27.8* 29.9*  PLT 156 160   BMET Recent Labs    03/17/23 0439  NA 132*  K 4.1  CL 103  CO2 20*  GLUCOSE 359*  BUN 19  CREATININE 0.64  CALCIUM 8.2*   PT/INR No results for input(s): "LABPROT", "INR" in the last 72 hours.  Studies/Results: No results found.  Anti-infectives: Anti-infectives (From admission, onward)    Start     Dose/Rate Route Frequency Ordered Stop   03/16/23 2200  meropenem (MERREM) 1 g in sodium chloride 0.9 % 100 mL IVPB        1 g 200 mL/hr over 30 Minutes Intravenous Every 8 hours 03/16/23 1121 03/21/23 2159   03/16/23 1100  meropenem (MERREM) 1 g in sodium chloride 0.9 % 100 mL IVPB  Status:  Discontinued        1 g 200 mL/hr over 30 Minutes Intravenous Every 8 hours 03/16/23 1014 03/16/23 1121   03/15/23 0000  vancomycin (VANCOREADY) IVPB 1250 mg/250 mL  Status:  Discontinued        1,250 mg 166.7 mL/hr over 90 Minutes Intravenous Every 24 hours 03/14/23 0338 03/16/23 1006   03/14/23 0600  ceFEPIme (MAXIPIME) 2 g in sodium chloride 0.9 % 100 mL IVPB  Status:  Discontinued         2 g 200 mL/hr over 30 Minutes Intravenous Every 8 hours 03/14/23 0338 03/16/23 1006   03/14/23 0145  vancomycin (VANCOREADY) IVPB 1500 mg/300 mL        1,500 mg 150 mL/hr over 120 Minutes Intravenous  Once 03/14/23 0143 03/14/23 0455   03/14/23 0030  cefTRIAXone (ROCEPHIN) 2 g in sodium chloride 0.9 % 100 mL IVPB        2 g 200 mL/hr over 30 Minutes Intravenous  Once 03/14/23 0016 03/14/23 0112   03/14/23 0030  metroNIDAZOLE (FLAGYL) IVPB 500 mg        500 mg 100 mL/hr over 60 Minutes Intravenous  Once 03/14/23 0016 03/14/23 0212   03/13/23 0000  cefpodoxime (VANTIN) 200 MG tablet        200 mg Oral 2 times daily 03/13/23 2327 03/20/23 2359   03/13/23 0000  azithromycin (ZITHROMAX) 250 MG tablet        250 mg Oral Daily 03/13/23 2327         Assessment/Plan: Impression: Slowly resolving cellulitis of diabetic right foot.  Upon discharge, patient should make arrangements to be followed by podiatry for further management and treatment of her diabetic feet.  Will continue to follow peripherally as needed.  LOS: 3 days    [name] 03/17/2023

## 2023-03-17 NOTE — Progress Notes (Signed)
PROGRESS NOTE    [name]  WUJ:[id number] DOB: [birth year] DOA: 03/13/2023 PCP: Ardith Dark, MD   Brief Narrative: [name] is a 72 y.o. female with a history of diabetes mellitus type 2, hyperlipidemia, hypertension, schizophrenia. Patient presented secondary to altered mental status in setting of hypoglycemia. On admission, she was found to meet criteria for sepsis with associated hypothermia requiring re-warming. Blood and urine cultures obtained and empiric antibiotics started. Chest x-ray concerning for pneumonia.  Presentation also complicated by worsened right foot cellulitis with associated diabetic foot ulcer.   Assessment and Plan: * Sepsis (HCC)-resolved as of 03/17/2023 Present on admission. Secondary to cellulitis and infected foot ulcer. Urinalysis also concerning for UTI and chest x-ray concerning for possible pneumonia. Empiric antibiotics started. Blood and urine cultures obtained. Urine culture significant for ESBL Klebsiella infection and blood cultures are no growth to date.  Normocytic anemia Chronic. Stable. -Check anemia panel  Fever Recurrent fever. Tmax of 102 F overnight. No associated symptoms per patient. Urine culture significant for UTI, however no presence of bacteremia; Klebsiella is identified as ESBL. RVP ordered and still pending. Procalcitonin is trending downward. Fever curve is improving with antibiotic change. -Trend fever curve -Follow-up RVP  Cellulitis of right foot Secondary to infected diabetic foot ulcer. MRI obtained and is without evidence of underlying abscess or osteomyelitis. Cellulitis improving. -Continue meropenem for associated ESBL Klebsiella pneumoniae UTI  UTI (urinary tract infection) Urinalysis highly suggestive of a UTI. Urine culture obtained and is significant for ESBL Klebsiella Pneumoniae. -Continue meropenem and treat for 5 days total  Acute respiratory failure with hypoxia (HCC) Secondary to  pneumonia -Wean to room air as able.  Diabetic foot ulcer (HCC) Patient with good pulses. ABI normal except with DP/PT artery waveforms suspicious for underlying disease. Recommendation for CT angiography of bilateral lower extremities recommended. CRP normal. ESR mildly elevated. X-ray and MRI negative for osteomyelitis. No debridement per general surgery. -Wound care recommendations (4/26): Topical treatment orders provided for bedside nurses to perform as follows to assist with removal of nonviable tissue: Apply Medihoney to right plantar foot wound Q day, then cover with foam dressing. Change foam dressing Q 3 days or PRN soiling.   Dyslipidemia associated with type 2 diabetes mellitus (HCC) Patient is managed on Novolog mix 70/30 25 units BID as an outpatient. Patient presented with hypoglycemia. Last hemoglobin A1C of 14.1% from 01/2023. SSI restarted on admission. Blood sugar trending up on SSI management only. -Continue SSI -Add Semglee 15 units daily  CAP (community acquired pneumonia) Multifocal pneumonia suggested on imaging. Empiric Vancomycin and Cefepime started on admission. Blood cultures and strep pneumo/legionella ur antigen testing ordered. Blood cultures with no growth to date. Vancomycin/Cefepime transitioned to meropenem to cover for ESBL UTI.  Mood disorder (HCC) -Continue Abilify, Wellbutrin, Haldol, Cogentin  Hypothermia-resolved as of 03/14/2023 In setting of sepsis, but more likely related to hypoglycemia. Patient improved with the use of an active heated air blanket. Resolved.  Hypoglycemia-resolved as of 03/15/2023 Presenting issue. Patient was found to be unresponsive with a blood glucose of 42 and responded to D10.    DVT prophylaxis: Heparin subq Code Status:   Code Status: DNR Family Communication: None at bedside Disposition Plan: Discharge likely to SNF pending transition to outpatient antibiotic regimen; likely discharge in 1-2 days    Consultants:   None   Procedures:  None   Antimicrobials: Vancomycin Cefepime Meropenem   Subjective: Patient feels well. No concerns today.  Objective: BP 135/63 (  BP Location: Left Arm)   Pulse 98   Temp 99.9 F (37.7 C) (Oral)   Resp 18   Ht 5\' 5"  (1.651 m)   Wt 74.8 kg   SpO2 95%   BMI 27.44 kg/m   Examination:  General exam: Appears calm and comfortable Respiratory system: Clear to auscultation. Respiratory effort normal. Cardiovascular system: S1 & S2 heard, RRR. No murmurs. Gastrointestinal system: Abdomen is nondistended, soft and nontender. Normal bowel sounds heard. Central nervous system: Alert and oriented. No focal neurological deficits. Skin: No cyanosis. No rashes Psychiatry: Judgement and insight appear normal. Mood & affect appropriate.    Data Reviewed: I have personally reviewed following labs and imaging studies  CBC Lab Results  Component Value Date   WBC 3.1 (L) 03/17/2023   RBC 3.50 (L) 03/17/2023   HGB 9.2 (L) 03/17/2023   HCT 29.9 (L) 03/17/2023   MCV 85.4 03/17/2023   MCH 26.3 03/17/2023   PLT 160 03/17/2023   MCHC 30.8 03/17/2023   RDW 15.2 03/17/2023   LYMPHSABS 0.5 (L) 03/14/2023   MONOABS 0.2 03/14/2023   EOSABS 0.0 03/14/2023   BASOSABS 0.0 03/14/2023     Last metabolic panel Lab Results  Component Value Date   NA 132 (L) 03/17/2023   K 4.1 03/17/2023   CL 103 03/17/2023   CO2 20 (L) 03/17/2023   BUN 19 03/17/2023   CREATININE 0.64 03/17/2023   GLUCOSE 359 (H) 03/17/2023   GFRNONAA >60 03/17/2023   GFRAA >60 12/06/2019   CALCIUM 8.2 (L) 03/17/2023   PHOS 1.9 (L) 11/27/2020   PROT 5.8 (L) 03/14/2023   ALBUMIN 2.5 (L) 03/14/2023   BILITOT 0.1 (L) 03/14/2023   ALKPHOS 76 03/14/2023   AST 15 03/14/2023   ALT 13 03/14/2023   ANIONGAP 9 03/17/2023    GFR: Estimated Creatinine Clearance: 67.2 mL/min (by C-G formula based on SCr of 0.64 mg/dL).  Recent Results (from the past 240 hour(s))  Urine Culture     Status: Abnormal    Collection Time: 03/13/23  9:25 PM   Specimen: Urine, Clean Catch  Result Value Ref Range Status   Specimen Description   Final    URINE, CLEAN CATCH Performed at [facility], [address] [place], [place] [address]    Special Requests   Final    NONE Reflexed from [id number] Performed at [facility], [address], [place], [place] [address]    Culture (A)  Final    >=100,000 COLONIES/mL KLEBSIELLA PNEUMONIAE Confirmed Extended Spectrum Beta-Lactamase Producer (ESBL).  In bloodstream infections from ESBL organisms, carbapenems are preferred over piperacillin/tazobactam. They are shown to have a lower risk of mortality.    Report Status 03/16/2023 FINAL  Final   Organism ID, Bacteria KLEBSIELLA PNEUMONIAE (A)  Final      Susceptibility   Klebsiella pneumoniae - MIC*    AMPICILLIN >=32 RESISTANT Resistant     CEFAZOLIN >=64 RESISTANT Resistant     CEFEPIME >=32 RESISTANT Resistant     CEFTRIAXONE >=64 RESISTANT Resistant     CIPROFLOXACIN 1 RESISTANT Resistant     GENTAMICIN >=16 RESISTANT Resistant     IMIPENEM <=0.25 SENSITIVE Sensitive     NITROFURANTOIN 64 INTERMEDIATE Intermediate     TRIMETH/SULFA >=320 RESISTANT Resistant     AMPICILLIN/SULBACTAM >=32 RESISTANT Resistant     PIP/TAZO 16 SENSITIVE Sensitive     * >=100,000 COLONIES/mL KLEBSIELLA PNEUMONIAE  Culture, blood (routine x 2)     Status: None (Preliminary result)  Collection Time: 03/13/23  9:40 PM   Specimen: BLOOD  Result Value Ref Range Status   Specimen Description BLOOD BLOOD LEFT ARM  Final   Special Requests   Final    BOTTLES DRAWN AEROBIC AND ANAEROBIC Blood Culture results may not be optimal due to an inadequate volume of blood received in culture bottles   Culture   Final    NO GROWTH 4 DAYS Performed at [facility], [address] [place], [place] [address]    Report Status PENDING  Incomplete  Culture, blood (routine x 2)     Status: None (Preliminary result)   Collection Time:  03/13/23  9:42 PM   Specimen: BLOOD  Result Value Ref Range Status   Specimen Description BLOOD BLOOD RIGHT HAND  Final   Special Requests   Final    BOTTLES DRAWN AEROBIC AND ANAEROBIC Blood Culture adequate volume   Culture   Final    NO GROWTH 4 DAYS Performed at [facility], [address] [place], [place] [address]    Report Status PENDING  Incomplete  SARS Coronavirus 2 by RT PCR (hospital order, performed in [facility] hospital lab) *cepheid single result test* Anterior Nasal Swab     Status: None   Collection Time: 03/14/23  1:45 AM   Specimen: Anterior Nasal Swab  Result Value Ref Range Status   SARS Coronavirus 2 by RT PCR NEGATIVE NEGATIVE Final    Comment: (NOTE) SARS-CoV-2 target nucleic acids are NOT DETECTED.  The SARS-CoV-2 RNA is generally detectable in upper and lower respiratory specimens during the acute phase of infection. The lowest concentration of SARS-CoV-2 viral copies this assay can detect is 250 copies / mL. A negative result does not preclude SARS-CoV-2 infection and should not be used as the sole basis for treatment or other patient management decisions.  A negative result may occur with improper specimen collection / handling, submission of specimen other than nasopharyngeal swab, presence of viral mutation(s) within the areas targeted by this assay, and inadequate number of viral copies (<250 copies / mL). A negative result must be combined with clinical observations, patient history, and epidemiological information.  Fact Sheet for Patients:   RoadLapTop.co.za  Fact Sheet for Healthcare Providers: http://kim-miller.com/  This test is not yet approved or  cleared by the [place] FDA and has been authorized for detection and/or diagnosis of SARS-CoV-2 by FDA under an Emergency Use Authorization (EUA).  This EUA will remain in effect (meaning this test can be used) for the duration of  the COVID-19 declaration under Section 564(b)(1) of the Act, 21 U.S.C. section 360bbb-3(b)(1), unless the authorization is terminated or revoked sooner.  Performed at [facility], [address], [place], [place] [address]       Radiology Studies: No results found.    LOS: 3 days    Jacquelin Hawking, MD [facility] 03/17/2023, 9:01 AM   If 7PM-7AM, please contact night-coverage [email or web address]

## 2023-03-17 NOTE — Assessment & Plan Note (Signed)
Chronic. Stable. -Check anemia panel

## 2023-03-17 NOTE — Inpatient Diabetes Management (Signed)
Inpatient Diabetes Program Recommendations  AACE/ADA: New Consensus Statement on Inpatient Glycemic Control   Target Ranges:  Prepandial:   less than 140 mg/dL      Peak postprandial:   less than 180 mg/dL (1-2 hours)      Critically ill patients:  140 - 180 mg/dL    Latest Reference Range & Units 03/16/23 08:29 03/16/23 12:01 03/16/23 16:27 03/16/23 20:08  Glucose-Capillary 70 - 99 mg/dL 161 (H) 096 (H) 045 (H) 362 (H)   Review of Glycemic Control  Diabetes history: DM2 Outpatient Diabetes medications: 70/30 25 units BID Current orders for Inpatient glycemic control: Novolog 0-5 units TID with meals, Novolog 0-5 units QHS   Inpatient Diabetes Program Recommendations:     Insulin: CBG 275-362 mg/dl on 02/26/80. Please consider ordering Semglee 10 units Q24H and change Novolog correction to 0-9 units TID with meals.   NOTE: Noted consult for Diabetes Coordinator. Diabetes Coordinator is not on campus over the weekend but available by pager from 8am to 5pm for questions or concerns.   Thanks, Orlando Penner, RN, MSN, CDCES Diabetes Coordinator Inpatient Diabetes Program [phone] (Team Pager from 8am to 5pm)

## 2023-03-17 NOTE — Progress Notes (Signed)
With assistance of other staff was able to convince patient to put her oxygen back on and after that she was redirectable , notified Dr. Caleb Popp going to hold off on IM haldol at this time, she is lying in bed watching TV , call bell in reach ,

## 2023-03-17 NOTE — Plan of Care (Signed)
  Problem: Metabolic: Goal: Ability to maintain appropriate glucose levels will improve Outcome: Not Progressing   Problem: Respiratory: Goal: Will regain and/or maintain adequate ventilation Outcome: Not Progressing   Problem: Urinary Elimination: Goal: Ability to achieve and maintain adequate renal perfusion and functioning will improve Outcome: Not Progressing   Problem: Safety: Goal: Ability to remain free from injury will improve Outcome: Not Progressing

## 2023-03-17 NOTE — Progress Notes (Signed)
Patient attempting to get OOB without assistance, states she is going home and we need to " get her clothes" Notified. Dr. Caleb Popp

## 2023-03-18 ENCOUNTER — Telehealth: Payer: Self-pay | Admitting: Family Medicine

## 2023-03-18 ENCOUNTER — Inpatient Hospital Stay (HOSPITAL_COMMUNITY): Payer: Medicare Other

## 2023-03-18 DIAGNOSIS — E10621 Type 1 diabetes mellitus with foot ulcer: Secondary | ICD-10-CM | POA: Diagnosis not present

## 2023-03-18 DIAGNOSIS — L97518 Non-pressure chronic ulcer of other part of right foot with other specified severity: Secondary | ICD-10-CM | POA: Diagnosis not present

## 2023-03-18 LAB — GLUCOSE, CAPILLARY
Glucose-Capillary: 400 mg/dL — ABNORMAL HIGH (ref 70–99)
Glucose-Capillary: 322 mg/dL — ABNORMAL HIGH (ref 70–99)
Glucose-Capillary: 194 mg/dL — ABNORMAL HIGH (ref 70–99)
Glucose-Capillary: 205 mg/dL — ABNORMAL HIGH (ref 70–99)

## 2023-03-18 LAB — CULTURE, BLOOD (ROUTINE X 2): Culture: NO GROWTH

## 2023-03-18 MED ORDER — INSULIN GLARGINE-YFGN 100 UNIT/ML ~~LOC~~ SOLN
20.0000 [IU] | Freq: Every day | SUBCUTANEOUS | Status: DC
  Administered 2023-03-18: 20 [IU] via SUBCUTANEOUS
  Filled 2023-03-18 (×3): qty 0.2

## 2023-03-18 MED ORDER — HALOPERIDOL LACTATE 5 MG/ML IJ SOLN
2.0000 mg | Freq: Once | INTRAMUSCULAR | Status: DC | PRN
  Filled 2023-03-18: qty 1

## 2023-03-18 NOTE — Progress Notes (Signed)
PROGRESS NOTE    [name]  RUE:[id number] DOB: [birth year] DOA: 03/13/2023 PCP: Ardith Dark, MD   Brief Narrative: [name] is a 72 y.o. female with a history of diabetes mellitus type 2, hyperlipidemia, hypertension, schizophrenia. Patient presented secondary to altered mental status in setting of hypoglycemia. On admission, she was found to meet criteria for sepsis with associated hypothermia requiring re-warming. Blood and urine cultures obtained and empiric antibiotics started. Chest x-ray concerning for pneumonia.  Presentation also complicated by worsened right foot cellulitis with associated diabetic foot ulcer.   Assessment and Plan: * Sepsis (HCC)-resolved as of 03/17/2023 Present on admission. Secondary to cellulitis and infected foot ulcer. Urinalysis also concerning for UTI and chest x-ray concerning for possible pneumonia. Empiric antibiotics started. Blood and urine cultures obtained. Urine culture significant for ESBL Klebsiella infection and blood cultures are no growth to date.  Normocytic anemia Chronic. Stable. Iron panel consistent with possible anemia of chronic disease. Vitamin B12 is low-normal.  Fever Recurrent fever. Tmax of 102 F overnight. No associated symptoms per patient. Urine culture significant for UTI, however no presence of bacteremia; Klebsiella is identified as ESBL. RVP ordered and still pending. Procalcitonin is trending downward. Fever curve is improving with antibiotic change. -Trend fever curve -Follow-up RVP  Cellulitis of right foot Secondary to infected diabetic foot ulcer. MRI obtained and is without evidence of underlying abscess or osteomyelitis. Cellulitis improving. -Continue meropenem for associated ESBL Klebsiella pneumoniae UTI  UTI (urinary tract infection) Urinalysis highly suggestive of a UTI. Urine culture obtained and is significant for ESBL Klebsiella Pneumoniae. -Continue meropenem and treat for 5 days  total  Acute respiratory failure with hypoxia (HCC) Secondary to pneumonia -Wean to room air as able.  Diabetic foot ulcer (HCC) Patient with good pulses. ABI normal except with DP/PT artery waveforms suspicious for underlying disease. Recommendation for CT angiography of bilateral lower extremities recommended. CRP normal. ESR mildly elevated. X-ray and MRI negative for osteomyelitis. No debridement per general surgery. -Wound care recommendations (4/26): Topical treatment orders provided for bedside nurses to perform as follows to assist with removal of nonviable tissue: Apply Medihoney to right plantar foot wound Q day, then cover with foam dressing. Change foam dressing Q 3 days or PRN soiling.   Dyslipidemia associated with type 2 diabetes mellitus (HCC) Patient is managed on Novolog mix 70/30 25 units BID as an outpatient. Patient presented with hypoglycemia. Last hemoglobin A1C of 14.1% from 01/2023. SSI restarted on admission. Blood sugar trending up on SSI management only. Glucose of 400, but checked after patient had oral intake of juice. -Continue SSI -Increase to Semglee 20 units daily  CAP (community acquired pneumonia) Multifocal pneumonia suggested on imaging. Empiric Vancomycin and Cefepime started on admission. Blood cultures and strep pneumo/legionella ur antigen testing ordered. Blood cultures with no growth to date. Vancomycin/Cefepime transitioned to meropenem to cover for ESBL UTI.  Mood disorder (HCC) -Continue Abilify, Wellbutrin, Haldol, Cogentin  Hypothermia-resolved as of 03/14/2023 In setting of sepsis, but more likely related to hypoglycemia. Patient improved with the use of an active heated air blanket. Resolved.  Hypoglycemia-resolved as of 03/15/2023 Presenting issue. Patient was found to be unresponsive with a blood glucose of 42 and responded to D10.    DVT prophylaxis: Heparin subq Code Status:   Code Status: DNR Family Communication: None at  bedside Disposition Plan: Discharge likely to SNF pending transition to outpatient antibiotic regimen; likely discharge in 1-2 days    Consultants:  None  Procedures:  None   Antimicrobials: Vancomycin Cefepime Meropenem   Subjective: No issues today. Patient is unclear what happened yesterday when she was having hallucinations.  Objective: BP 103/68 (BP Location: Left Arm)   Pulse 99   Temp 99 F (37.2 C) (Oral)   Resp 18   Ht 5\' 5"  (1.651 m)   Wt 74.8 kg   SpO2 97%   BMI 27.44 kg/m   Examination:  General exam: Appears calm and comfortable Respiratory system: Clear to auscultation. Respiratory effort normal. Cardiovascular system: S1 & S2 heard, RRR. Systolic murmur Gastrointestinal system: Abdomen is nondistended, soft and nontender. Normal bowel sounds heard. Central nervous system: Alert and oriented. No focal neurological deficits. Skin: No cyanosis. No rashes  Data Reviewed: I have personally reviewed following labs and imaging studies  CBC Lab Results  Component Value Date   WBC 3.1 (L) 03/17/2023   RBC 3.50 (L) 03/17/2023   RBC 3.48 (L) 03/17/2023   HGB 9.2 (L) 03/17/2023   HCT 29.9 (L) 03/17/2023   MCV 85.4 03/17/2023   MCH 26.3 03/17/2023   PLT 160 03/17/2023   MCHC 30.8 03/17/2023   RDW 15.2 03/17/2023   LYMPHSABS 0.5 (L) 03/14/2023   MONOABS 0.2 03/14/2023   EOSABS 0.0 03/14/2023   BASOSABS 0.0 03/14/2023     Last metabolic panel Lab Results  Component Value Date   NA 132 (L) 03/17/2023   K 4.1 03/17/2023   CL 103 03/17/2023   CO2 20 (L) 03/17/2023   BUN 19 03/17/2023   CREATININE 0.64 03/17/2023   GLUCOSE 359 (H) 03/17/2023   GFRNONAA >60 03/17/2023   GFRAA >60 12/06/2019   CALCIUM 8.2 (L) 03/17/2023   PHOS 1.9 (L) 11/27/2020   PROT 5.8 (L) 03/14/2023   ALBUMIN 2.5 (L) 03/14/2023   BILITOT 0.1 (L) 03/14/2023   ALKPHOS 76 03/14/2023   AST 15 03/14/2023   ALT 13 03/14/2023   ANIONGAP 9 03/17/2023    GFR: Estimated  Creatinine Clearance: 67.2 mL/min (by C-G formula based on SCr of 0.64 mg/dL).  Recent Results (from the past 240 hour(s))  Urine Culture     Status: Abnormal   Collection Time: 03/13/23  9:25 PM   Specimen: Urine, Clean Catch  Result Value Ref Range Status   Specimen Description   Final    URINE, CLEAN CATCH Performed at [facility], [address], [place], [place] [address]    Special Requests   Final    NONE Reflexed from [id number] Performed at [facility], [address] [place], [place] [address]    Culture (A)  Final    >=100,000 COLONIES/mL KLEBSIELLA PNEUMONIAE Confirmed Extended Spectrum Beta-Lactamase Producer (ESBL).  In bloodstream infections from ESBL organisms, carbapenems are preferred over piperacillin/tazobactam. They are shown to have a lower risk of mortality.    Report Status 03/16/2023 FINAL  Final   Organism ID, Bacteria KLEBSIELLA PNEUMONIAE (A)  Final      Susceptibility   Klebsiella pneumoniae - MIC*    AMPICILLIN >=32 RESISTANT Resistant     CEFAZOLIN >=64 RESISTANT Resistant     CEFEPIME >=32 RESISTANT Resistant     CEFTRIAXONE >=64 RESISTANT Resistant     CIPROFLOXACIN 1 RESISTANT Resistant     GENTAMICIN >=16 RESISTANT Resistant     IMIPENEM <=0.25 SENSITIVE Sensitive     NITROFURANTOIN 64 INTERMEDIATE Intermediate     TRIMETH/SULFA >=320 RESISTANT Resistant     AMPICILLIN/SULBACTAM >=32 RESISTANT Resistant     PIP/TAZO 16 SENSITIVE Sensitive     * >=  100,000 COLONIES/mL KLEBSIELLA PNEUMONIAE  Culture, blood (routine x 2)     Status: None   Collection Time: 03/13/23  9:40 PM   Specimen: BLOOD  Result Value Ref Range Status   Specimen Description BLOOD BLOOD LEFT ARM  Final   Special Requests   Final    BOTTLES DRAWN AEROBIC AND ANAEROBIC Blood Culture results may not be optimal due to an inadequate volume of blood received in culture bottles   Culture   Final    NO GROWTH 5 DAYS Performed at [facility], [address], [place],  [place] [address]    Report Status 03/18/2023 FINAL  Final  Culture, blood (routine x 2)     Status: None   Collection Time: 03/13/23  9:42 PM   Specimen: BLOOD  Result Value Ref Range Status   Specimen Description BLOOD BLOOD RIGHT HAND  Final   Special Requests   Final    BOTTLES DRAWN AEROBIC AND ANAEROBIC Blood Culture adequate volume   Culture   Final    NO GROWTH 5 DAYS Performed at [facility], [address], [place], [place] [address]    Report Status 03/18/2023 FINAL  Final  SARS Coronavirus 2 by RT PCR (hospital order, performed in [facility] hospital lab) *cepheid single result test* Anterior Nasal Swab     Status: None   Collection Time: 03/14/23  1:45 AM   Specimen: Anterior Nasal Swab  Result Value Ref Range Status   SARS Coronavirus 2 by RT PCR NEGATIVE NEGATIVE Final    Comment: (NOTE) SARS-CoV-2 target nucleic acids are NOT DETECTED.  The SARS-CoV-2 RNA is generally detectable in upper and lower respiratory specimens during the acute phase of infection. The lowest concentration of SARS-CoV-2 viral copies this assay can detect is 250 copies / mL. A negative result does not preclude SARS-CoV-2 infection and should not be used as the sole basis for treatment or other patient management decisions.  A negative result may occur with improper specimen collection / handling, submission of specimen other than nasopharyngeal swab, presence of viral mutation(s) within the areas targeted by this assay, and inadequate number of viral copies (<250 copies / mL). A negative result must be combined with clinical observations, patient history, and epidemiological information.  Fact Sheet for Patients:   RoadLapTop.co.za  Fact Sheet for Healthcare Providers: http://kim-miller.com/  This test is not yet approved or  cleared by the [place] FDA and has been authorized for detection and/or diagnosis of SARS-CoV-2 by FDA under an  Emergency Use Authorization (EUA).  This EUA will remain in effect (meaning this test can be used) for the duration of the COVID-19 declaration under Section 564(b)(1) of the Act, 21 U.S.C. section 360bbb-3(b)(1), unless the authorization is terminated or revoked sooner.  Performed at [facility], [address] [place], [place] [address]       Radiology Studies: No results found.    LOS: 4 days    Jacquelin Hawking, MD [facility] 03/18/2023, 7:49 AM   If 7PM-7AM, please contact night-coverage [email or web address]

## 2023-03-18 NOTE — Progress Notes (Addendum)
03/18/2023 4:59 PM  Capacity Evaluation  In my opinion after bedside assessment, at this time patient does not appear to have the decisional capacity to make an informed decision to agree to or refuse treatment.     The patient lacks capacity for the following reason(s):   - Patient is unable to express a choice to either accept or refuse medications.    - Patient lacks an understanding of the nature of the current treatments, medication, its risks, benefits, and/or alternatives related to   -psychosis, decompensated schizophrenia   - Patient lacks an appreciation of the nature of his/her current circumstances or  psychiatric condition and the decision to treat the condition with medication because   - Patient denies the existence of an illness requiring treatment   - Patient expresses delusions about the treatment and its potential side effects   - Illness affects person's ability to appreciate how treatment might be of benefit   Rodney Langton, MD

## 2023-03-18 NOTE — Inpatient Diabetes Management (Signed)
Inpatient Diabetes Program Recommendations  AACE/ADA: New Consensus Statement on Inpatient Glycemic Control (2015)  Target Ranges:  Prepandial:   less than 140 mg/dL      Peak postprandial:   less than 180 mg/dL (1-2 hours)      Critically ill patients:  140 - 180 mg/dL    Latest Reference Range & Units 03/17/23 07:35 03/17/23 10:53 03/17/23 16:31 03/17/23 20:53 03/18/23 07:23  Glucose-Capillary 70 - 99 mg/dL 657 (H) 846 (H) 962 (H) 247 (H) 400 (H)   Review of Glycemic Control  Diabetes history: DM2 Outpatient Diabetes medications: 70/30 25 units BID Current orders for Inpatient glycemic control: Semglee 20 units daily, Novolog 0-15 units TID with meals, Novolog 0-5 units QHS  Inpatient Diabetes Program Recommendations:    Insulin: Noted Semglee increased form 15 to 20 units daily today. Please consider ordering Novolog 5 units TID with meals for meal coverage if patient eats at least 50% of meals.  Thanks, Orlando Penner, RN, MSN, CDCES Diabetes Coordinator Inpatient Diabetes Program [phone] (Team Pager from 8am to 5pm)

## 2023-03-18 NOTE — NC FL2 (Deleted)
[place] MEDICAID FL2 LEVEL OF CARE FORM     IDENTIFICATION  Patient Name: [name] Birthdate: July 24, 1951 Sex: female Admission Date (Current Location): 03/13/2023  [facility] and [place] Number:  [employer] and Address:  [facility],  [address] [place] [address]      Provider Number: [id number]  Attending Physician Name and Address:  Narda Bonds, MD  Relative Name and Phone Number:       Current Level of Care: Hospital Recommended Level of Care: Skilled Nursing Facility Prior Approval Number:    Date Approved/Denied:   PASRR Number:    Discharge Plan: SNF    Current Diagnoses: Patient Active Problem List   Diagnosis Date Noted   Normocytic anemia 03/17/2023   Fever 03/16/2023   Diabetic foot ulcer (HCC) 03/14/2023   Acute respiratory failure with hypoxia (HCC) 03/14/2023   UTI (urinary tract infection) 03/14/2023   Cellulitis of right foot 03/14/2023   Hyperglycemia 02/25/2023   DKA (diabetic ketoacidosis) (HCC) 02/13/2023   Pseudohyponatremia 02/13/2023   Elevated beta-hydroxybutyrate 02/13/2023   Tachycardia 02/13/2023   Protein calorie malnutrition (HCC) 02/13/2023   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   Hypertension associated with diabetes (HCC) 08/16/2022   Dyslipidemia associated with type 2 diabetes mellitus (HCC) 08/16/2022   Schizoaffective disorder (HCC) 11/08/2014   CAP (community acquired pneumonia)    AKI (acute kidney injury) (HCC)    Mood disorder (HCC) 05/22/2014    Orientation RESPIRATION BLADDER Height & Weight     Self, Place  Normal Incontinent Weight: 164 lb 14.5 oz (74.8 kg) Height:  5\' 5"  (165.1 cm)  BEHAVIORAL SYMPTOMS/MOOD NEUROLOGICAL BOWEL NUTRITION STATUS      Continent Diet (Heart healthy)  AMBULATORY STATUS COMMUNICATION OF NEEDS Skin     Verbally Normal, Other (Comment) (right foot ulcer)                       Personal Care  Assistance Level of Assistance  Bathing, Feeding, Dressing Bathing Assistance: Limited assistance Feeding assistance: Independent Dressing Assistance: Limited assistance Total Care Assistance: Maximum assistance   Functional Limitations Info  Sight, Hearing, Speech Sight Info: Adequate Hearing Info: Adequate Speech Info: Adequate    SPECIAL CARE FACTORS FREQUENCY  PT (By licensed PT), OT (By licensed OT)     PT Frequency: 5 times weekly OT Frequency: 5 times weekly            Contractures Contractures Info: Not present    Additional Factors Info  Code Status, Allergies Code Status Info: DNR Allergies Info: Penicillins           Current Medications (03/18/2023):  This is the current hospital active medication list Current Facility-Administered Medications  Medication Dose Route Frequency Provider Last Rate Last Admin   acetaminophen (TYLENOL) tablet 650 mg  650 mg Oral Q6H PRN Zierle-Ghosh, Asia B, DO   650 mg at 03/17/23 8119   Or   acetaminophen (TYLENOL) suppository 650 mg  650 mg Rectal Q6H PRN Zierle-Ghosh, Asia B, DO       albuterol (PROVENTIL) (2.5 MG/3ML) 0.083% nebulizer solution 2.5 mg  2.5 mg Nebulization Q4H PRN Zierle-Ghosh, Asia B, DO       ARIPiprazole (ABILIFY) tablet 30 mg  30 mg Oral QHS Zierle-Ghosh, Asia B, DO   30 mg at 03/17/23 2238   aspirin EC tablet 81 mg  81 mg Oral Daily Zierle-Ghosh, Asia B, DO   81  mg at 03/17/23 0829   benzonatate (TESSALON) capsule 200 mg  200 mg Oral BID PRN Zierle-Ghosh, Asia B, DO   200 mg at 03/16/23 2236   benztropine (COGENTIN) tablet 1 mg  1 mg Oral QHS Zierle-Ghosh, Asia B, DO   1 mg at 03/17/23 2238   bu[facility]) tablet 75 mg  75 mg Oral BID Zierle-Ghosh, Asia B, DO   75 mg at 03/17/23 2238   diphenhydrAMINE (BENADRYL) capsule 25 mg  25 mg Oral Q6H PRN Zierle-Ghosh, Asia B, DO       haloperidol (HALDOL) tablet 1 mg  1 mg Oral Daily Zierle-Ghosh, Asia B, DO   1 mg at 03/17/23 0829   heparin injection  5,000 Units  5,000 Units Subcutaneous Q8H Zierle-Ghosh, Asia B, DO   5,000 Units at 03/18/23 0526   insulin aspart (novoLOG) injection 0-15 Units  0-15 Units Subcutaneous TID WC Narda Bonds, MD   8 Units at 03/17/23 1738   insulin glargine-yfgn (SEMGLEE) injection 15 Units  15 Units Subcutaneous Daily Narda Bonds, MD   15 Units at 03/17/23 1028   leptospermum manuka honey (MEDIHONEY) paste 1 Application  1 Application Topical Daily Narda Bonds, MD   1 Application at 03/17/23 0830   living well with diabetes book MISC   Does not apply Once Narda Bonds, MD       meropenem (MERREM) 1 g in sodium chloride 0.9 % 100 mL IVPB  1 g Intravenous Q8H Narda Bonds, MD 200 mL/hr at 03/18/23 0534 1 g at 03/18/23 0534   multivitamin with minerals tablet 1 tablet  1 tablet Oral Daily Narda Bonds, MD   1 tablet at 03/17/23 0829   ondansetron (ZOFRAN) tablet 4 mg  4 mg Oral Q6H PRN Zierle-Ghosh, Asia B, DO       Or   ondansetron (ZOFRAN) injection 4 mg  4 mg Intravenous Q6H PRN Zierle-Ghosh, Asia B, DO       oxyCODONE (Oxy IR/ROXICODONE) immediate release tablet 5 mg  5 mg Oral Q4H PRN Zierle-Ghosh, Asia B, DO   5 mg at 03/17/23 2238     Discharge Medications: Please see discharge summary for a list of discharge medications.  Relevant Imaging Results:  Relevant Lab Results:   Additional Information SSN: [id number], [place]

## 2023-03-18 NOTE — Evaluation (Signed)
Physical Therapy Evaluation Patient Details Name: [name] MRN: [id number] DOB: 1951/03/03 Today's Date: 03/18/2023  History of Present Illness  [name] is a 72 y.o. female with medical history significant of depression, diabetes mellitus, hyperlipidemia, hypertension, schizophrenia, and more presents to the ED with a chief complaint of altered mental status.  Unfortunately, patient does not remember much of the episode.  After speaking with the ED staff and reviewing the chart, it seems that patient was seen yesterday for a diabetic foot ulcer and prescribed Bactrim.  She has not yet started it.  Today she was found unresponsive, questionably outside, and family reported to nursing staff that they gave her her shot of insulin when they found her this way.  Patient herself reports that they do not normally check her sugar before they administer insulin.  She takes her insulin every day per her report, and she checks her glucose approximately once per week.  She reports that her glucose runs about 120, but when advised that a hemoglobin A1c of 14 does not line up with a glucose of 120, she readily says she does not check her glucose as often as she should.  When paramedics got there they found her to have a glucose of 42.  They were not able to get a vein so they drilled the IO to give her D10 per the ED per the ED report.  As the glucose improved to the patient's mentation.  She reports remembering people talking about her glucose being low but she does not remember being unresponsive, or what happened immediately before being unresponsive.  The only complaint that she had from earlier in the day was a pain in her wrist on the right.  Unfortunately she is not able to provide any more history about this specific episode.   Clinical Impression  Patient functioning near baseline for functional mobility and gait with good for bed mobility, transferring to/from chair, commode in bathroom  and ambulating in room/hallways without loss of balance.  Plan:  Patient discharged from physical therapy to care of nursing for ambulation daily as tolerated for length of stay.         Recommendations for follow up therapy are one component of a multi-disciplinary discharge planning process, led by the attending physician.  Recommendations may be updated based on patient status, additional functional criteria and insurance authorization.  Follow Up Recommendations       Assistance Recommended at Discharge PRN  Patient can return home with the following  Help with stairs or ramp for entrance    Equipment Recommendations None recommended by PT  Recommendations for Other Services       Functional Status Assessment Patient has not had a recent decline in their functional status     Precautions / Restrictions Precautions Precautions: None Restrictions Weight Bearing Restrictions: No      Mobility  Bed Mobility Overal bed mobility: Independent                  Transfers Overall transfer level: Independent                      Ambulation/Gait Ambulation/Gait assistance: Modified independent (Device/Increase time) Gait Distance (Feet): 150 Feet Assistive device: None Gait Pattern/deviations: WFL(Within Functional Limits) Gait velocity: slightly decreased     General Gait Details: slightly labored cadence without loss of balance with good return for ambulating in room and hallways  Stairs  Wheelchair Mobility    Modified Rankin (Stroke Patients Only)       Balance Overall balance assessment: No apparent balance deficits (not formally assessed)                                           Pertinent Vitals/Pain Pain Assessment Pain Assessment: No/denies pain    Home Living Family/patient expects to be discharged to:: Private residence Living Arrangements: Spouse/significant other;Other (Comment) (Boy  friend) Available Help at Discharge: Family;Friend(s) Type of Home: Apartment Home Access: Stairs to enter Entrance Stairs-Rails: Right;Left;Can reach both Entrance Stairs-Number of Steps: 2-3   Home Layout: One level Home Equipment: Grab bars - toilet      Prior Function Prior Level of Function : Independent/Modified Independent;Driving             Mobility Comments: Community without AD, drives ADLs Comments: Independent     Hand Dominance   Dominant Hand: Right    Extremity/Trunk Assessment   Upper Extremity Assessment Upper Extremity Assessment: Overall WFL for tasks assessed    Lower Extremity Assessment Lower Extremity Assessment: Overall WFL for tasks assessed    Cervical / Trunk Assessment Cervical / Trunk Assessment: Normal  Communication   Communication: No difficulties  Cognition Arousal/Alertness: Awake/alert Behavior During Therapy: WFL for tasks assessed/performed Overall Cognitive Status: No family/caregiver present to determine baseline cognitive functioning                                          General Comments      Exercises     Assessment/Plan    PT Assessment Patient does not need any further PT services  PT Problem List         PT Treatment Interventions      PT Goals (Current goals can be found in the Care Plan section)  Acute Rehab PT Goals Patient Stated Goal: return home with boyfriend to assist PT Goal Formulation: With patient Time For Goal Achievement: 03/18/23 Potential to Achieve Goals: Good    Frequency       Co-evaluation               AM-PAC PT "6 Clicks" Mobility  Outcome Measure Help needed turning from your back to your side while in a flat bed without using bedrails?: None Help needed moving from lying on your back to sitting on the side of a flat bed without using bedrails?: None Help needed moving to and from a bed to a chair (including a wheelchair)?: None Help needed  standing up from a chair using your arms (e.g., wheelchair or bedside chair)?: None Help needed to walk in hospital room?: None Help needed climbing 3-5 steps with a railing? : A Little 6 Click Score: 23    End of Session   Activity Tolerance: Patient tolerated treatment well Patient left: in chair;with call bell/phone within reach Nurse Communication: Mobility status PT Visit Diagnosis: Unsteadiness on feet (R26.81);Other abnormalities of gait and mobility (R26.89);Muscle weakness (generalized) (M62.81)    Time: 4098-1191 PT Time Calculation (min) (ACUTE ONLY): 20 min   Charges:   PT Evaluation $PT Eval Moderate Complexity: 1 Mod PT Treatments $Therapeutic Activity: 8-22 mins        2:49 PM, 03/18/23 Ocie Bob, MPT Physical Therapist with [facility]  [place] office [phone] mobile phone

## 2023-03-18 NOTE — Progress Notes (Signed)
Pt becoming increasingly agitated. Continues to take off tele monitor and is out of bed/chair saying she wants to leave. Contacted MD. TTS consult placed and IVC paperwork initiated by MD.

## 2023-03-18 NOTE — TOC Progression Note (Signed)
Transition of Care [facility]) - Progression Note    Patient Details  Name: [name] MRN: [id number] Date of Birth: May 26, 1951  [facility] CM/SW Contact  [place], [place] Phone Number: 03/18/2023, 2:38 PM  Clinical Narrative:    CSW spoke with pt and her sister to review bed offers. They accept bed offer at [facility] for SNF. CSW spoke to [place] in admissions who states they do not have a bed today for pt. CSW to follow up tomorrow to see if [facility] has a bed available. TOC to follow.   Expected Discharge Plan: Home w Home Health Services Barriers to Discharge: Continued Medical Work up  Expected Discharge Plan and Services In-house Referral: Clinical Social Work     Living arrangements for the past 2 months: Single Family Home                                       Social Determinants of Health (SDOH) Interventions SDOH Screenings   Food Insecurity: No Food Insecurity (03/14/2023)  Housing: Low Risk  (03/14/2023)  Transportation Needs: No Transportation Needs (03/14/2023)  Utilities: Not At Risk (03/14/2023)  Depression (PHQ2-9): Low Risk  (02/07/2023)  Recent Concern: Depression (PHQ2-9) - Medium Risk (12/18/2022)  Tobacco Use: Medium Risk (03/13/2023)    Readmission Risk Interventions    11/30/2020   11:08 AM 11/29/2020   10:33 AM  Readmission Risk Prevention Plan  Transportation Screening  Complete  PCP or Specialist Appt within 5-7 Days Complete   PCP or Specialist Appt within 3-5 Days  Complete  Home Care Screening Complete   Medication Review (RN CM) Complete   HRI or Home Care Consult  Complete  Social Work Consult for Recovery Care Planning/Counseling  Complete  Palliative Care Screening  Not Applicable  Medication Review Oceanographer)  Complete

## 2023-03-18 NOTE — Progress Notes (Signed)
Patient did not sleep at all last night, she remained in bed watching TV and was talking each time I entered. She did threaten to leave several times but was redirected. She would not keep her oxygen on. She was confused but did hold her arm in place for her IV not to beep.

## 2023-03-18 NOTE — Progress Notes (Signed)
Mobility Specialist Progress Note:    03/18/23 1200  Mobility  Activity Ambulated with assistance in room  Level of Assistance Modified independent, requires aide device or extra time  Assistive Device None;Other (Comment) (Bed Rails)  Distance Ambulated (ft) 25 ft  Activity Response Tolerated well  Mobility Referral Yes  $Mobility charge 1 Mobility   Pt agreeable to mobility session. Tolerated well, asx throughout. Pt was received in chair, not wearing O2, SpO2 85% on RA. Directed pt to wear O2, recovered, SpO2 92% on 4L. ModI used with bed rails for stability in room. Tolerated session well, asx throughout. Left pt in chair, alarm on, all needs met.   Feliciana Rossetti Mobility Specialist Please contact via Special educational needs teacher or  Rehab office at [phone]

## 2023-03-18 NOTE — Telephone Encounter (Signed)
Received faxed  document Home Health Certificate (Order ID [id number]), to be filled out by provider. Patient requested to send it via Fax [phone] . Document is located in providers tray at front office.

## 2023-03-19 DIAGNOSIS — R6520 Severe sepsis without septic shock: Secondary | ICD-10-CM | POA: Diagnosis not present

## 2023-03-19 DIAGNOSIS — E11621 Type 2 diabetes mellitus with foot ulcer: Secondary | ICD-10-CM | POA: Diagnosis not present

## 2023-03-19 DIAGNOSIS — J189 Pneumonia, unspecified organism: Secondary | ICD-10-CM | POA: Diagnosis not present

## 2023-03-19 DIAGNOSIS — J811 Chronic pulmonary edema: Secondary | ICD-10-CM | POA: Insufficient documentation

## 2023-03-19 DIAGNOSIS — A419 Sepsis, unspecified organism: Secondary | ICD-10-CM | POA: Diagnosis not present

## 2023-03-19 LAB — GLUCOSE, CAPILLARY
Glucose-Capillary: 356 mg/dL — ABNORMAL HIGH (ref 70–99)
Glucose-Capillary: 277 mg/dL — ABNORMAL HIGH (ref 70–99)
Glucose-Capillary: 70 mg/dL (ref 70–99)
Glucose-Capillary: 44 mg/dL — CL (ref 70–99)
Glucose-Capillary: 121 mg/dL — ABNORMAL HIGH (ref 70–99)
Glucose-Capillary: 147 mg/dL — ABNORMAL HIGH (ref 70–99)

## 2023-03-19 MED ORDER — HALOPERIDOL LACTATE 5 MG/ML IJ SOLN
5.0000 mg | Freq: Once | INTRAMUSCULAR | Status: AC
  Administered 2023-03-19: 5 mg via INTRAVENOUS
  Filled 2023-03-19: qty 1

## 2023-03-19 MED ORDER — HALOPERIDOL 0.5 MG PO TABS
1.0000 mg | ORAL_TABLET | Freq: Two times a day (BID) | ORAL | Status: DC
  Administered 2023-03-19 – 2023-03-21 (×4): 1 mg via ORAL
  Filled 2023-03-19 (×4): qty 2

## 2023-03-19 MED ORDER — INSULIN GLARGINE-YFGN 100 UNIT/ML ~~LOC~~ SOLN
30.0000 [IU] | Freq: Every day | SUBCUTANEOUS | Status: DC
  Administered 2023-03-19: 30 [IU] via SUBCUTANEOUS
  Filled 2023-03-19 (×3): qty 0.3

## 2023-03-19 MED ORDER — ARIPIPRAZOLE 5 MG PO TABS
15.0000 mg | ORAL_TABLET | Freq: Two times a day (BID) | ORAL | Status: DC
  Administered 2023-03-19 – 2023-03-21 (×4): 15 mg via ORAL
  Filled 2023-03-19 (×4): qty 1

## 2023-03-19 MED ORDER — FUROSEMIDE 10 MG/ML IJ SOLN
40.0000 mg | Freq: Once | INTRAMUSCULAR | Status: AC
  Administered 2023-03-19: 40 mg via INTRAVENOUS
  Filled 2023-03-19: qty 4

## 2023-03-19 NOTE — TOC Progression Note (Signed)
Transition of Care [facility]) - Progression Note    Patient Details  Name: [name] MRN: [id number] Date of Birth: 1951-08-05  Transition [facility]) CM/SW Contact  [place], [place] Phone Number: 03/19/2023, 2:06 PM  Clinical Narrative:    CSW updated that [facility] has rescinded bed offer at this time. Debbie in admissions states that she could review referral again once pts has calmed down and is no longer attempting to elope. TOC to follow.   Expected Discharge Plan: Home w Home Health Services Barriers to Discharge: Continued Medical Work up  Expected Discharge Plan and Services In-house Referral: Clinical Social Work     Living arrangements for the past 2 months: Single Family Home                                       Social Determinants of Health (SDOH) Interventions SDOH Screenings   Food Insecurity: No Food Insecurity (03/14/2023)  Housing: Low Risk  (03/14/2023)  Transportation Needs: No Transportation Needs (03/14/2023)  Utilities: Not At Risk (03/14/2023)  Depression (PHQ2-9): Low Risk  (02/07/2023)  Recent Concern: Depression (PHQ2-9) - Medium Risk (12/18/2022)  Tobacco Use: Medium Risk (03/13/2023)    Readmission Risk Interventions    11/30/2020   11:08 AM 11/29/2020   10:33 AM  Readmission Risk Prevention Plan  Transportation Screening  Complete  PCP or Specialist Appt within 5-7 Days Complete   PCP or Specialist Appt within 3-5 Days  Complete  Home Care Screening Complete   Medication Review (RN CM) Complete   HRI or Home Care Consult  Complete  Social Work Consult for Recovery Care Planning/Counseling  Complete  Palliative Care Screening  Not Applicable  Medication Review Oceanographer)  Complete

## 2023-03-19 NOTE — Final Consult Note (Signed)
Multiple attempts to assess patient, per requested consult due to agitation, hallucinations and patient attempted to elope.  RN stated she will contact this provider back when assessment cart is available. - contacted [phone], however the called rolled back to RN.

## 2023-03-19 NOTE — Consult Note (Signed)
Telepsych Consultation   Reason for Consult:  "Mood disorder, required IVC on 04/29 for attempts to elope" Referring Physician:  Dr Caleb Popp Location of Patient: [facility]  Location of Provider: Behavioral Health TTS Department  Patient Identification: [name] MRN:  [id number] Principal Diagnosis: Sepsis [facility]) Diagnosis:  Active Problems:   Mood disorder (HCC)   CAP (community acquired pneumonia)   Dyslipidemia associated with type 2 diabetes mellitus (HCC)   Diabetic foot ulcer (HCC)   Acute respiratory failure with hypoxia (HCC)   UTI (urinary tract infection)   Cellulitis of right foot   Normocytic anemia   Pulmonary edema   Total Time spent with patient: 30 minutes  Subjective:   [name] is a 72 y.o. female patient admitted 03/13/2023 found unresponsive and outside, altered mental status.  Patient states "it is my fault that I got hurt, I fell when I was putting things up."  [name] is able to articulate treatment plan with minimal prompting. Patient states "I did agree to go to a facility to get stronger,only for 2-3 weeks."   HPI: Patient is assessed by this nurse provider, virtually. Chart reviewed and patient discussed with attending psychiatrist, Dr Nelly Rout on 03/19/2023. She is alert and oriented x4.  She is seated, eating lunch. Denies physical complaints. She is appropriately groomed. She presents with euthymic mood, congruent affect.   [name] has diagnoses of mood disorder and schizoaffective disorder. She is compliant with medications including aripiprazole 30mg QHS, benztropine 1mg  QHS, bupropion 75 mg BID and haloperidol 1mg  daily. Followed by outpatient psychiatry for medication management. She endorsees one previous inpatient psychiatric hospitalization approx 15 years ago. No family mental health or addiction history reported.   Suicidal ideation.  She denies history of suicide.  She denies history of nonsuicidal self-harm  behavior.  She contracts verbally for safety at this time.  She denies auditory and visual hallucinations.  There is no evidence of delusional thoughts and no indication that patient is responding to internal stimuli. She deneis symptoms of paranoia.   Patient resides with significant other in [place]. She denies access to weapons. Receives [employer] income. Denies alcohol and substance use. She endorses average sleep and appetite.   Patient offered support and encouragement. Reviewed medications, discussed potential side effects and offered opportunity to ask questions.   Per report from attending provider patient has demonstrated a change in perception daily for the last two days. Change in presentation potentially related to delirium vs schizoaffective disorder.  Past Psychiatric History: mood disorder, schizoaffective disorder  Risk to Self:   denies Risk to Others:   denies Prior Inpatient Therapy:   per patient report, one previous inpatient psychiatric hospitalization approx 15 years ago Prior Outpatient Therapy:   currently followed by outpatient psychiatry  Past Medical History:  Past Medical History:  Diagnosis Date   Allergy    Depression    Diabetes mellitus without complication (HCC)    Hyperlipidemia    Hypertension    Mixed stress and urge urinary incontinence    Osteoporosis    Pneumonia    Schizophrenia (HCC)    Stroke [facility])    History reviewed. No pertinent surgical history. Family History:  Family History  Problem Relation Age of Onset   Heart disease Mother    Hypertension Father    Stroke Father    Heart disease Father    Diabetes Father    Emphysema Paternal Grandfather    Family Psychiatric  History: none reported Social History:  Social History   Substance and Sexual Activity  Alcohol Use Yes   Alcohol/week: 6.0 standard drinks of alcohol   Types: 6 Cans of beer per week     Social History   Substance and Sexual Activity  Drug Use  No    Social History   Socioeconomic History   Marital status: Single    Spouse name: Not on file   Number of children: Not on file   Years of education: Not on file   Highest education level: Not on file  Occupational History   Not on file  Tobacco Use   Smoking status: Former    Packs/day: 3    Types: Cigarettes   Smokeless tobacco: Never  Substance and Sexual Activity   Alcohol use: Yes    Alcohol/week: 6.0 standard drinks of alcohol    Types: 6 Cans of beer per week   Drug use: No   Sexual activity: Not on file  Other Topics Concern   Not on file  Social History Narrative   Not on file   Social Determinants of Health   Financial Resource Strain: Not on file  Food Insecurity: No Food Insecurity (03/14/2023)   Hunger Vital Sign    Worried About Running Out of Food in the Last Year: Never true    Ran Out of Food in the Last Year: Never true  Transportation Needs: No Transportation Needs (03/14/2023)   PRAPARE - Administrator, Civil Service (Medical): No    Lack of Transportation (Non-Medical): No  Physical Activity: Not on file  Stress: Not on file  Social Connections: Not on file   Additional Social History:    Allergies:   Allergies  Allergen Reactions   Penicillins Rash and Hives    Did it involve swelling of the face/tongue/throat, SOB, or low BP? Unknown Did it involve sudden or severe rash/hives, skin peeling, or any reaction on the inside of your mouth or nose? Unknown Did you need to seek medical attention at a hospital or doctor's office? Unknown When did it last happen?       If all above answers are "NO", may proceed with cephalosporin use.  Did it involve swelling of the face/tongue/throat, SOB, or low BP? Unknown Did it involve sudden or severe rash/hives, skin peeling, or any reaction on the inside of your mouth or nose? Unknown Did you need to seek medical attention at a hospital or doctor's office? Unknown When did it last happen?        If all above answers are "NO", may proceed with cephalosporin use.    Labs:  Results for orders placed or performed during the hospital encounter of 03/13/23 (from the past 48 hour(s))  Glucose, capillary     Status: Abnormal   Collection Time: 03/17/23  4:31 PM  Result Value Ref Range   Glucose-Capillary 278 (H) 70 - 99 mg/dL    Comment: Glucose reference range applies only to samples taken after fasting for at least 8 hours.  Glucose, capillary     Status: Abnormal   Collection Time: 03/17/23  8:53 PM  Result Value Ref Range   Glucose-Capillary 247 (H) 70 - 99 mg/dL    Comment: Glucose reference range applies only to samples taken after fasting for at least 8 hours.  Glucose, capillary     Status: Abnormal   Collection Time: 03/18/23  7:23 AM  Result Value Ref Range   Glucose-Capillary 400 (H) 70 - 99 mg/dL  Comment: Glucose reference range applies only to samples taken after fasting for at least 8 hours.  Glucose, capillary     Status: Abnormal   Collection Time: 03/18/23 11:33 AM  Result Value Ref Range   Glucose-Capillary 322 (H) 70 - 99 mg/dL    Comment: Glucose reference range applies only to samples taken after fasting for at least 8 hours.  Glucose, capillary     Status: Abnormal   Collection Time: 03/18/23  4:38 PM  Result Value Ref Range   Glucose-Capillary 194 (H) 70 - 99 mg/dL    Comment: Glucose reference range applies only to samples taken after fasting for at least 8 hours.  Glucose, capillary     Status: Abnormal   Collection Time: 03/18/23  8:25 PM  Result Value Ref Range   Glucose-Capillary 205 (H) 70 - 99 mg/dL    Comment: Glucose reference range applies only to samples taken after fasting for at least 8 hours.  Glucose, capillary     Status: Abnormal   Collection Time: 03/19/23  8:35 AM  Result Value Ref Range   Glucose-Capillary 356 (H) 70 - 99 mg/dL    Comment: Glucose reference range applies only to samples taken after fasting for at least 8  hours.  Glucose, capillary     Status: Abnormal   Collection Time: 03/19/23 11:19 AM  Result Value Ref Range   Glucose-Capillary 277 (H) 70 - 99 mg/dL    Comment: Glucose reference range applies only to samples taken after fasting for at least 8 hours.    Medications:  Current Facility-Administered Medications  Medication Dose Route Frequency Provider Last Rate Last Admin   acetaminophen (TYLENOL) tablet 650 mg  650 mg Oral Q6H PRN Zierle-Ghosh, Asia B, DO   650 mg at 03/17/23 0981   Or   acetaminophen (TYLENOL) suppository 650 mg  650 mg Rectal Q6H PRN Zierle-Ghosh, Asia B, DO       albuterol (PROVENTIL) (2.5 MG/3ML) 0.083% nebulizer solution 2.5 mg  2.5 mg Nebulization Q4H PRN Zierle-Ghosh, Asia B, DO       ARIPiprazole (ABILIFY) tablet 30 mg  30 mg Oral QHS Zierle-Ghosh, Asia B, DO   30 mg at 03/18/23 2235   aspirin EC tablet 81 mg  81 mg Oral Daily Zierle-Ghosh, Asia B, DO   81 mg at 03/19/23 1048   benzonatate (TESSALON) capsule 200 mg  200 mg Oral BID PRN Zierle-Ghosh, Asia B, DO   200 mg at 03/16/23 2236   benztropine (COGENTIN) tablet 1 mg  1 mg Oral QHS Zierle-Ghosh, Asia B, DO   1 mg at 03/18/23 2236   buPROPion (WELLBUTRIN) tablet 75 mg  75 mg Oral BID Zierle-Ghosh, Asia B, DO   75 mg at 03/19/23 1049   diphenhydrAMINE (BENADRYL) capsule 25 mg  25 mg Oral Q6H PRN Zierle-Ghosh, Asia B, DO       haloperidol (HALDOL) tablet 1 mg  1 mg Oral Daily Zierle-Ghosh, Asia B, DO   1 mg at 03/19/23 1047   haloperidol lactate (HALDOL) injection 2 mg  2 mg Intravenous Once PRN Narda Bonds, MD       heparin injection 5,000 Units  5,000 Units Subcutaneous Q8H Zierle-Ghosh, Asia B, DO   5,000 Units at 03/19/23 0549   insulin aspart (novoLOG) injection 0-15 Units  0-15 Units Subcutaneous TID WC Narda Bonds, MD   8 Units at 03/19/23 1258   insulin glargine-yfgn (SEMGLEE) injection 30 Units  30 Units Subcutaneous Daily Jacquelin Hawking  A, MD   30 Units at 03/19/23 1047   leptospermum manuka  honey (MEDIHONEY) paste 1 Application  1 Application Topical Daily Narda Bonds, MD   1 Application at 03/18/23 1400   living well with diabetes book MISC   Does not apply Once Narda Bonds, MD       meropenem (MERREM) 1 g in sodium chloride 0.9 % 100 mL IVPB  1 g Intravenous Q8H Narda Bonds, MD 200 mL/hr at 03/19/23 0549 1 g at 03/19/23 0549   multivitamin with minerals tablet 1 tablet  1 tablet Oral Daily Narda Bonds, MD   1 tablet at 03/19/23 1047   ondansetron (ZOFRAN) tablet 4 mg  4 mg Oral Q6H PRN Zierle-Ghosh, Asia B, DO       Or   ondansetron (ZOFRAN) injection 4 mg  4 mg Intravenous Q6H PRN Zierle-Ghosh, Asia B, DO       oxyCODONE (Oxy IR/ROXICODONE) immediate release tablet 5 mg  5 mg Oral Q4H PRN Zierle-Ghosh, Asia B, DO   5 mg at 03/18/23 2235    Musculoskeletal: Strength & Muscle Tone: within normal limits Gait & Station:  unable to assess Patient leans:  unable to assess          Psychiatric Specialty Exam:  Presentation  General Appearance: Appropriate for Environment; Casual  Eye Contact:Good  Speech:Clear and Coherent; Normal Rate  Speech Volume:Normal  Handedness:Right   Mood and Affect  Mood:Euthymic  Affect:Congruent; Appropriate   Thought Process  Thought Processes:Coherent; Goal Directed  Descriptions of Associations:Intact  Orientation:Full (Time, Place and Person)  Thought Content:Logical  History of Schizophrenia/Schizoaffective disorder:No data recorded Duration of Psychotic Symptoms:No data recorded Hallucinations:Hallucinations: None  Ideas of Reference:None  Suicidal Thoughts:Suicidal Thoughts: No  Homicidal Thoughts:Homicidal Thoughts: No   Sensorium  Memory:Immediate Fair  Judgment:Fair  Insight:Fair   Executive Functions  Concentration:Good  Attention Span:Good  Recall:Good  Fund of Knowledge:Good  Language:Good   Psychomotor Activity  Psychomotor Activity:Psychomotor Activity:  Normal   Assets  Assets:Communication Skills; Desire for Improvement; Financial Resources/Insurance; Housing; Resilience; Social Support   Sleep  Sleep:Sleep: Good    Physical Exam: Physical Exam Vitals and nursing note reviewed.  Constitutional:      Appearance: Normal appearance. She is normal weight.  HENT:     Head: Normocephalic and atraumatic.     Nose: Nose normal.  Pulmonary:     Effort: Pulmonary effort is normal.  Musculoskeletal:        General: Normal range of motion.     Cervical back: Normal range of motion.  Neurological:     Mental Status: She is alert and oriented to person, place, and time.  Psychiatric:        Attention and Perception: Attention and perception normal.        Mood and Affect: Mood and affect normal.        Speech: Speech normal.        Behavior: Behavior normal. Behavior is cooperative.        Thought Content: Thought content normal.        Cognition and Memory: Cognition normal.   Review of Systems  Constitutional: Negative.   HENT: Negative.    Eyes: Negative.   Respiratory: Negative.    Cardiovascular: Negative.   Gastrointestinal: Negative.   Genitourinary: Negative.   Musculoskeletal: Negative.   Skin: Negative.   Neurological: Negative.   Psychiatric/Behavioral: Negative.     Blood pressure (!) 129/53, pulse 82, temperature 97.8 F (  36.6 C), resp. rate 16, height 5\' 5"  (1.651 m), weight 74.8 kg, SpO2 90 %. Body mass index is 27.44 kg/m.  Treatment Plan Summary: Medication management. Continue current medications including Aripiprazole 30mg  QHS, benztropine 1mg  QHS and haloperidol 1mg  daily.  Consider: -increase haloperidol to 1 mg BID -repeat EKG on 03/20/2023 -aripiprazole 30mg - update to 15mg  BID for increased mood control   Disposition: No evidence of imminent risk to self or others at present.   Patient does not meet criteria for psychiatric inpatient admission. Supportive therapy provided about ongoing  stressors. Discussed crisis plan, support from social network, calling 911, coming to the Emergency Department, and calling Suicide Hotline.  This service was provided via telemedicine using a 2-way, interactive audio and video technology.  Names of all persons participating in this telemedicine service and their role in this encounter. Name: Doran Heater  Role: Nurse Practitioner  Name: Dr Nelly Rout Role: Psychiatrist  Name: Dr Caleb Popp Role: Attending Physician    Lenard Lance, FNP 03/19/2023 1:05 PM

## 2023-03-19 NOTE — Assessment & Plan Note (Signed)
Mild. Associated hypoxia. -Lasix 40 mg IV x1

## 2023-03-19 NOTE — Telephone Encounter (Signed)
Placed in PCP sign folder.

## 2023-03-19 NOTE — Inpatient Diabetes Management (Signed)
Inpatient Diabetes Program Recommendations  AACE/ADA: New Consensus Statement on Inpatient Glycemic Control   Target Ranges:  Prepandial:   less than 140 mg/dL      Peak postprandial:   less than 180 mg/dL (1-2 hours)      Critically ill patients:  140 - 180 mg/dL    Latest Reference Range & Units 03/18/23 07:23 03/18/23 11:33 03/18/23 16:38 03/18/23 20:25  Glucose-Capillary 70 - 99 mg/dL 621 (H) 308 (H) 657 (H) 205 (H)    Latest Reference Range & Units 03/17/23 07:35 03/17/23 10:53 03/17/23 16:31 03/17/23 20:53  Glucose-Capillary 70 - 99 mg/dL 846 (H) 962 (H) 952 (H) 247 (H)   Review of Glycemic Control  Diabetes history: DM2 Outpatient Diabetes medications: 70/30 25 units BID Current orders for Inpatient glycemic control: Semglee 20 units daily, Novolog 0-15 units TID with meals   Inpatient Diabetes Program Recommendations:     Insulin:  Please consider ordering Novolog 4 units TID with meals for meal coverage if patient eats at least 50% of meals.   Thanks, Orlando Penner, RN, MSN, CDCES Diabetes Coordinator Inpatient Diabetes Program [phone] (Team Pager from 8am to 5pm)

## 2023-03-19 NOTE — Progress Notes (Signed)
PROGRESS NOTE    LEILANA MCQUIRE  WUJ:[id number] DOB: 1951-03-05 DOA: 03/13/2023 PCP: Ardith Dark, MD   Brief Narrative: [name] is a 72 y.o. female with a history of diabetes mellitus type 2, hyperlipidemia, hypertension, schizophrenia. Patient presented secondary to altered mental status in setting of hypoglycemia. On admission, she was found to meet criteria for sepsis with associated hypothermia requiring re-warming. Blood and urine cultures obtained and empiric antibiotics started. Chest x-ray concerning for pneumonia.  Presentation also complicated by worsened right foot cellulitis with associated diabetic foot ulcer. Patient also found to have evidence of ESBL Klebsiella pneumoniae UTI. During hospitalization, patient developed desire to leave, but secondary to lack of capacity, patient IVCd on 4/29.   Assessment and Plan: * Sepsis (HCC)-resolved as of 03/17/2023 Present on admission. Secondary to cellulitis and infected foot ulcer. Urinalysis also concerning for UTI and chest x-ray concerning for possible pneumonia. Empiric antibiotics started. Blood and urine cultures obtained. Urine culture significant for ESBL Klebsiella infection and blood cultures are no growth to date.  Pulmonary edema Mild. Associated hypoxia. -Lasix 40 mg IV x1  Normocytic anemia Chronic. Stable. Iron panel consistent with possible anemia of chronic disease. Vitamin B12 is low-normal.  Cellulitis of right foot Secondary to infected diabetic foot ulcer. MRI obtained and is without evidence of underlying abscess or osteomyelitis. Cellulitis improving. -Continue meropenem for associated ESBL Klebsiella pneumoniae UTI  UTI (urinary tract infection) Urinalysis highly suggestive of a UTI. Urine culture obtained and is significant for ESBL Klebsiella Pneumoniae. -Continue meropenem and treat for 5 days total  Acute respiratory failure with hypoxia (HCC) Secondary to pneumonia -Wean to  room air as able.  Diabetic foot ulcer (HCC) Patient with good pulses. ABI normal except with DP/PT artery waveforms suspicious for underlying disease. Recommendation for CT angiography of bilateral lower extremities recommended. CRP normal. ESR mildly elevated. X-ray and MRI negative for osteomyelitis. No debridement per general surgery. -Wound care recommendations (4/26): Topical treatment orders provided for bedside nurses to perform as follows to assist with removal of nonviable tissue: Apply Medihoney to right plantar foot wound Q day, then cover with foam dressing. Change foam dressing Q 3 days or PRN soiling.   Dyslipidemia associated with type 2 diabetes mellitus (HCC) Patient is managed on Novolog mix 70/30 25 units BID as an outpatient. Patient presented with hypoglycemia. Last hemoglobin A1C of 14.1% from 01/2023. SSI restarted on admission. Blood sugar trending up on SSI management only. Continued uncontrolled blood sugar. -Continue SSI -Increase to Semglee 30 units daily  CAP (community acquired pneumonia) Multifocal pneumonia suggested on imaging. Empiric Vancomycin and Cefepime started on admission. Blood cultures and strep pneumo/legionella ur antigen testing ordered. Blood cultures with no growth to date. Vancomycin/Cefepime transitioned to meropenem to cover for ESBL UTI.  Mood disorder [facility]) Patient required IVC on 4/29 for attempts to elope without evidence of capacity. -Continue Abilify, Wellbutrin, Haldol, Cogentin -Psychiatry consult (4/29) pending  Fever-resolved as of 03/19/2023 Recurrent fever. Tmax of 102 F overnight. No associated symptoms per patient. Urine culture significant for UTI, however no presence of bacteremia; Klebsiella is identified as ESBL. RVP ordered and still pending. Procalcitonin is trending downward. Fever curve is improving with antibiotic change. Fevers appear to have now resolved.  Hypothermia-resolved as of 03/14/2023 In setting of sepsis, but  more likely related to hypoglycemia. Patient improved with the use of an active heated air blanket. Resolved.  Hypoglycemia-resolved as of 03/15/2023 Presenting issue. Patient was found to be unresponsive with  a blood glucose of 42 and responded to D10.    DVT prophylaxis: Heparin subq Code Status:   Code Status: DNR Family Communication: None at bedside Disposition Plan: Discharge likely to SNF pending transition to outpatient antibiotic regimen and psychiatry recommendations; likely discharge in 1-2 days    Consultants:  None   Procedures:  None   Antimicrobials: Vancomycin Cefepime Meropenem   Subjective: Patient states she does not remember the events of yesterday afternoon. No concerns today. She was confused about why she was in the hospital.  Objective: BP (!) 129/53 (BP Location: Right Arm)   Pulse 82   Temp 97.8 F (36.6 C)   Resp 16   Ht 5\' 5"  (1.651 m)   Wt 74.8 kg   SpO2 90%   BMI 27.44 kg/m   Examination:  General exam: Appears calm and comfortable  Respiratory system: Clear to auscultation. Respiratory effort normal. Cardiovascular system: S1 & S2 heard, RRR. No murmurs. Gastrointestinal system: Abdomen is nondistended, soft and nontender. Normal bowel sounds heard. Central nervous system: Alert and oriented. No focal neurological deficits. Musculoskeletal: No calf tenderness. BLE edema   Data Reviewed: I have personally reviewed following labs and imaging studies  CBC Lab Results  Component Value Date   WBC 3.1 (L) 03/17/2023   RBC 3.50 (L) 03/17/2023   RBC 3.48 (L) 03/17/2023   HGB 9.2 (L) 03/17/2023   HCT 29.9 (L) 03/17/2023   MCV 85.4 03/17/2023   MCH 26.3 03/17/2023   PLT 160 03/17/2023   MCHC 30.8 03/17/2023   RDW 15.2 03/17/2023   LYMPHSABS 0.5 (L) 03/14/2023   MONOABS 0.2 03/14/2023   EOSABS 0.0 03/14/2023   BASOSABS 0.0 03/14/2023     Last metabolic panel Lab Results  Component Value Date   NA 132 (L) 03/17/2023   K 4.1  03/17/2023   CL 103 03/17/2023   CO2 20 (L) 03/17/2023   BUN 19 03/17/2023   CREATININE 0.64 03/17/2023   GLUCOSE 359 (H) 03/17/2023   GFRNONAA >60 03/17/2023   GFRAA >60 12/06/2019   CALCIUM 8.2 (L) 03/17/2023   PHOS 1.9 (L) 11/27/2020   PROT 5.8 (L) 03/14/2023   ALBUMIN 2.5 (L) 03/14/2023   BILITOT 0.1 (L) 03/14/2023   ALKPHOS 76 03/14/2023   AST 15 03/14/2023   ALT 13 03/14/2023   ANIONGAP 9 03/17/2023    GFR: Estimated Creatinine Clearance: 67.2 mL/min (by C-G formula based on SCr of 0.64 mg/dL).  Recent Results (from the past 240 hour(s))  Urine Culture     Status: Abnormal   Collection Time: 03/13/23  9:25 PM   Specimen: Urine, Clean Catch  Result Value Ref Range Status   Specimen Description   Final    URINE, CLEAN CATCH Performed at [facility], [address] [place], [place] [address]    Special Requests   Final    NONE Reflexed from [id number] Performed at [facility], [address], [place], [place] [address]    Culture (A)  Final    >=100,000 COLONIES/mL KLEBSIELLA PNEUMONIAE Confirmed Extended Spectrum Beta-Lactamase Producer (ESBL).  In bloodstream infections from ESBL organisms, carbapenems are preferred over piperacillin/tazobactam. They are shown to have a lower risk of mortality.    Report Status 03/16/2023 FINAL  Final   Organism ID, Bacteria KLEBSIELLA PNEUMONIAE (A)  Final      Susceptibility   Klebsiella pneumoniae - MIC*    AMPICILLIN >=32 RESISTANT Resistant     CEFAZOLIN >=64 RESISTANT Resistant  CEFEPIME >=32 RESISTANT Resistant     CEFTRIAXONE >=64 RESISTANT Resistant     CIPROFLOXACIN 1 RESISTANT Resistant     GENTAMICIN >=16 RESISTANT Resistant     IMIPENEM <=0.25 SENSITIVE Sensitive     NITROFURANTOIN 64 INTERMEDIATE Intermediate     TRIMETH/SULFA >=320 RESISTANT Resistant     AMPICILLIN/SULBACTAM >=32 RESISTANT Resistant     PIP/TAZO 16 SENSITIVE Sensitive     * >=100,000 COLONIES/mL KLEBSIELLA PNEUMONIAE  Culture, blood  (routine x 2)     Status: None   Collection Time: 03/13/23  9:40 PM   Specimen: BLOOD  Result Value Ref Range Status   Specimen Description BLOOD BLOOD LEFT ARM  Final   Special Requests   Final    BOTTLES DRAWN AEROBIC AND ANAEROBIC Blood Culture results may not be optimal due to an inadequate volume of blood received in culture bottles   Culture   Final    NO GROWTH 5 DAYS Performed at [facility], [address] [place], [place] [address]    Report Status 03/18/2023 FINAL  Final  Culture, blood (routine x 2)     Status: None   Collection Time: 03/13/23  9:42 PM   Specimen: BLOOD  Result Value Ref Range Status   Specimen Description BLOOD BLOOD RIGHT HAND  Final   Special Requests   Final    BOTTLES DRAWN AEROBIC AND ANAEROBIC Blood Culture adequate volume   Culture   Final    NO GROWTH 5 DAYS Performed at [facility], [address], [place], [place] [address]    Report Status 03/18/2023 FINAL  Final  SARS Coronavirus 2 by RT PCR (hospital order, performed in [facility] hospital lab) *cepheid single result test* Anterior Nasal Swab     Status: None   Collection Time: 03/14/23  1:45 AM   Specimen: Anterior Nasal Swab  Result Value Ref Range Status   SARS Coronavirus 2 by RT PCR NEGATIVE NEGATIVE Final    Comment: (NOTE) SARS-CoV-2 target nucleic acids are NOT DETECTED.  The SARS-CoV-2 RNA is generally detectable in upper and lower respiratory specimens during the acute phase of infection. The lowest concentration of SARS-CoV-2 viral copies this assay can detect is 250 copies / mL. A negative result does not preclude SARS-CoV-2 infection and should not be used as the sole basis for treatment or other patient management decisions.  A negative result may occur with improper specimen collection / handling, submission of specimen other than nasopharyngeal swab, presence of viral mutation(s) within the areas targeted by this assay, and inadequate number of viral copies (<250  copies / mL). A negative result must be combined with clinical observations, patient history, and epidemiological information.  Fact Sheet for Patients:   RoadLapTop.co.za  Fact Sheet for Healthcare Providers: http://kim-miller.com/  This test is not yet approved or  cleared by the [place] FDA and has been authorized for detection and/or diagnosis of SARS-CoV-2 by FDA under an Emergency Use Authorization (EUA).  This EUA will remain in effect (meaning this test can be used) for the duration of the COVID-19 declaration under Section 564(b)(1) of the Act, 21 U.S.C. section 360bbb-3(b)(1), unless the authorization is terminated or revoked sooner.  Performed at [facility], [address], [place], [place] [address]       Radiology Studies: DG CHEST PORT 1 VIEW  Result Date: 03/18/2023 CLINICAL DATA:  Acute respiratory failure with hypoxia EXAM: PORTABLE CHEST 1 VIEW COMPARISON:  Radiographs 03/13/2023 FINDINGS: Stable cardiomediastinal silhouette. Similar bilateral interstitial  coarsening. Patchy areas of atelectasis or infiltrate are unchanged. Small right pleural effusion. No pneumothorax. IMPRESSION: 1. Unchanged bilateral interstitial coarsening and patchy areas of atelectasis or pneumonia. 2. New small right pleural effusion. Electronically Signed   By: Minerva Fester M.D.   On: 03/18/2023 17:59      LOS: 5 days    Jacquelin Hawking, MD [facility] 03/19/2023, 9:38 AM   If 7PM-7AM, please contact night-coverage [email or web address]

## 2023-03-19 NOTE — Progress Notes (Signed)
Mobility Specialist Progress Note:    03/19/23 1311  Mobility  Activity Ambulated with assistance in hallway  Level of Assistance Modified independent, requires aide device or extra time  Assistive Device Other (Comment) (Hand rails)  Distance Ambulated (ft) 100 ft  Activity Response Tolerated well  Mobility Referral Yes  $Mobility charge 1 Mobility   Pt agreeable to mobility session, received in chair. SpO2 86% on RA in chair before session. Pt recovered, SpO2 96% on 2L. Sats remained stable throughout ambulation. Returned pt to chair, nurse in room, all needs met, alarm on.   Feliciana Rossetti Mobility Specialist Please contact via Special educational needs teacher or  Rehab office at [phone]

## 2023-03-20 DIAGNOSIS — E10621 Type 1 diabetes mellitus with foot ulcer: Secondary | ICD-10-CM | POA: Diagnosis not present

## 2023-03-20 DIAGNOSIS — L97518 Non-pressure chronic ulcer of other part of right foot with other specified severity: Secondary | ICD-10-CM | POA: Diagnosis not present

## 2023-03-20 DIAGNOSIS — E162 Hypoglycemia, unspecified: Secondary | ICD-10-CM

## 2023-03-20 LAB — CBC
HCT: 28.5 % — ABNORMAL LOW (ref 36.0–46.0)
Hemoglobin: 8.7 g/dL — ABNORMAL LOW (ref 12.0–15.0)
MCH: 25.8 pg — ABNORMAL LOW (ref 26.0–34.0)
MCHC: 30.5 g/dL (ref 30.0–36.0)
MCV: 84.6 fL (ref 80.0–100.0)
Platelets: 182 10*3/uL (ref 150–400)
RBC: 3.37 MIL/uL — ABNORMAL LOW (ref 3.87–5.11)
RDW: 15.8 % — ABNORMAL HIGH (ref 11.5–15.5)
WBC: 3.8 10*3/uL — ABNORMAL LOW (ref 4.0–10.5)
nRBC: 0 % (ref 0.0–0.2)

## 2023-03-20 LAB — BASIC METABOLIC PANEL
Anion gap: 9 (ref 5–15)
BUN: 21 mg/dL (ref 8–23)
CO2: 28 mmol/L (ref 22–32)
Calcium: 8.7 mg/dL — ABNORMAL LOW (ref 8.9–10.3)
Chloride: 102 mmol/L (ref 98–111)
Creatinine, Ser: 0.56 mg/dL (ref 0.44–1.00)
GFR, Estimated: >60 mL/min (ref >60)
Glucose, Bld: 272 mg/dL — ABNORMAL HIGH (ref 70–99)
Potassium: 3.8 mmol/L (ref 3.5–5.1)
Sodium: 139 mmol/L (ref 135–145)

## 2023-03-20 LAB — GLUCOSE, CAPILLARY
Glucose-Capillary: 231 mg/dL — ABNORMAL HIGH (ref 70–99)
Glucose-Capillary: 225 mg/dL — ABNORMAL HIGH (ref 70–99)
Glucose-Capillary: 52 mg/dL — ABNORMAL LOW (ref 70–99)
Glucose-Capillary: 119 mg/dL — ABNORMAL HIGH (ref 70–99)
Glucose-Capillary: 259 mg/dL — ABNORMAL HIGH (ref 70–99)

## 2023-03-20 MED ORDER — INSULIN GLARGINE-YFGN 100 UNIT/ML ~~LOC~~ SOLN
20.0000 [IU] | Freq: Every day | SUBCUTANEOUS | Status: DC
  Administered 2023-03-20 – 2023-03-21 (×2): 20 [IU] via SUBCUTANEOUS
  Filled 2023-03-20 (×3): qty 0.2

## 2023-03-20 MED ORDER — INSULIN ASPART 100 UNIT/ML IJ SOLN
4.0000 [IU] | Freq: Three times a day (TID) | INTRAMUSCULAR | Status: DC
  Administered 2023-03-20 – 2023-03-21 (×3): 4 [IU] via SUBCUTANEOUS

## 2023-03-20 MED ORDER — GLUCOSE 40 % PO GEL
ORAL | Status: AC
  Filled 2023-03-20: qty 1.21

## 2023-03-20 MED ORDER — FUROSEMIDE 10 MG/ML IJ SOLN
40.0000 mg | Freq: Once | INTRAMUSCULAR | Status: AC
  Administered 2023-03-20: 40 mg via INTRAVENOUS
  Filled 2023-03-20: qty 4

## 2023-03-20 NOTE — Progress Notes (Signed)
   03/20/23 1634  Provider Notification  Provider Name/Title Dr Benjamine Mola  Date Provider Notified 03/20/23  Time Provider Notified 1635  Method of Notification Page  Notification Reason Critical Result (CBG 52)  Date Critical Result Received 03/20/23  Time Critical Result Received 1635  Provider response See new orders  Date of Provider Response 03/20/23  Time of Provider Response 1636

## 2023-03-20 NOTE — TOC Progression Note (Signed)
Transition of Care [facility]) - Progression Note    Patient Details  Name: [name] MRN: [id number] Date of Birth: 1951-06-30  Transition of Care [facility]) CM/SW Contact  Elliot Gault, LCSW Phone Number: 03/20/2023, 3:33 PM  Clinical Narrative:     TOC following. Debbie from [facility] was here to evaluate pt in person. Per Eunice Blase, they will accept pt and can admit her tomorrow.  Updated pt's sister. Updated MD.  Will follow up in AM.  Expected Discharge Plan: Home w Home Health Services Barriers to Discharge: Continued Medical Work up  Expected Discharge Plan and Services In-house Referral: Clinical Social Work     Living arrangements for the past 2 months: Single Family Home                                       Social Determinants of Health (SDOH) Interventions SDOH Screenings   Food Insecurity: No Food Insecurity (03/14/2023)  Housing: Low Risk  (03/14/2023)  Transportation Needs: No Transportation Needs (03/14/2023)  Utilities: Not At Risk (03/14/2023)  Depression (PHQ2-9): Low Risk  (02/07/2023)  Recent Concern: Depression (PHQ2-9) - Medium Risk (12/18/2022)  Tobacco Use: Medium Risk (03/13/2023)    Readmission Risk Interventions    11/30/2020   11:08 AM 11/29/2020   10:33 AM  Readmission Risk Prevention Plan  Transportation Screening  Complete  PCP or Specialist Appt within 5-7 Days Complete   PCP or Specialist Appt within 3-5 Days  Complete  Home Care Screening Complete   Medication Review (RN CM) Complete   HRI or Home Care Consult  Complete  Social Work Consult for Recovery Care Planning/Counseling  Complete  Palliative Care Screening  Not Applicable  Medication Review Oceanographer)  Complete

## 2023-03-20 NOTE — Progress Notes (Signed)
Foley removed. Pt tolerate well. 10 cc of water/saline removed form balloon. Pending void.

## 2023-03-20 NOTE — Progress Notes (Signed)
PROGRESS NOTE    [name]  ZOX:[id number] DOB: [birth year] DOA: 03/13/2023 PCP: Ardith Dark, MD   Brief Narrative: [name] is a 73 y.o. female with a history of diabetes mellitus type 2, hyperlipidemia, hypertension, schizophrenia. Patient presented secondary to altered mental status in setting of hypoglycemia. On admission, she was found to meet criteria for sepsis with associated hypothermia requiring re-warming. Blood and urine cultures obtained and empiric antibiotics started. Chest x-ray concerning for pneumonia.  Presentation also complicated by worsened right foot cellulitis with associated diabetic foot ulcer. Patient also found to have evidence of ESBL Klebsiella pneumoniae UTI. During hospitalization, patient developed desire to leave, but secondary to lack of capacity, patient IVCd on 4/29. Seen by psych on 4/30-- does not meet inpatient criteria.  Patient calm and cooperative on 5/1-- plan for SNF   Assessment and Plan: * Sepsis (HCC)-resolved as of 03/17/2023 Present on admission. Secondary to cellulitis and infected foot ulcer. Urinalysis also concerning for UTI and chest x-ray concerning for possible pneumonia. Empiric antibiotics started. Blood and urine cultures obtained. Urine culture significant for ESBL Klebsiella infection and blood cultures are no growth to date.  Pulmonary edema Mild. Associated hypoxia. -Lasix 40 mg IV x2  Normocytic anemia Chronic. Stable. Iron panel consistent with possible anemia of chronic disease. Vitamin B12 is low-normal.  Cellulitis of right foot Secondary to infected diabetic foot ulcer. MRI obtained and is without evidence of underlying abscess or osteomyelitis. Cellulitis improving. -Continue meropenem for associated ESBL Klebsiella pneumoniae UTI x 5 days  UTI (urinary tract infection) Urinalysis highly suggestive of a UTI. Urine culture obtained and is significant for ESBL Klebsiella Pneumoniae. -Continue  meropenem and treat for 5 days total  Acute respiratory failure with hypoxia (HCC) Secondary to volume overload?  Diabetic foot ulcer (HCC) Patient with good pulses. ABI normal except with DP/PT artery waveforms suspicious for underlying disease. Recommendation for CT angiography of bilateral lower extremities recommended. CRP normal. ESR mildly elevated. X-ray and MRI negative for osteomyelitis. No debridement per general surgery. -Wound care recommendations (4/26): Topical treatment orders provided for bedside nurses to perform as follows to assist with removal of nonviable tissue: Apply Medihoney to right plantar foot wound Q day, then cover with foam dressing. Change foam dressing Q 3 days or PRN soiling.   Dyslipidemia associated with type 2 diabetes mellitus (HCC) Patient is managed on Novolog mix 70/30 25 units BID as an outpatient. Patient presented with hypoglycemia. Last hemoglobin A1C of 14.1% from 01/2023. SSI restarted on admission. Blood sugar trending up on SSI management only. Continued uncontrolled blood sugar. -Continue SSI-- decrease long acting and add meal coverage  CAP (community acquired pneumonia) Multifocal pneumonia suggested on imaging. Empiric Vancomycin and Cefepime started on admission. Blood cultures and strep pneumo/legionella ur antigen testing ordered. Blood cultures with no growth to date. Vancomycin/Cefepime transitioned to meropenem to cover for ESBL UTI.  Mood disorder [facility]) Patient required IVC on 4/29 for attempts to elope without evidence of capacity. -Continue Abilify, Wellbutrin, Haldol, Cogentin -Psychiatry consulted: No evidence of imminent risk to self or others at present.   Patient does not meet criteria for psychiatric inpatient admission. Supportive therapy provided about ongoing stressors.  Fever-resolved as of 03/19/2023 Recurrent fever. Tmax of 102 F overnight. No associated symptoms per patient. Urine culture significant for UTI, however  no presence of bacteremia; Klebsiella is identified as ESBL. RVP ordered and still pending. Procalcitonin is trending downward. Fever curve is improving with antibiotic change. Fevers appear  to have now resolved.  Hypothermia-resolved as of 03/14/2023 In setting of sepsis, but more likely related to hypoglycemia. Patient improved with the use of an active heated air blanket. Resolved.  Hypoglycemia-resolved as of 03/15/2023 Presenting issue. Patient was found to be unresponsive with a blood glucose of 42 and responded to D10.    DVT prophylaxis: Heparin subq Code Status:   Code Status: DNR Family Communication: None at bedside Disposition Plan: Discharge likely to SNF when bed available    Consultants:  psych   Subjective: No overnight issues  Objective: BP 132/82 (BP Location: Left Arm)   Pulse 76   Temp 98.6 F (37 C) (Oral)   Resp 18   Ht 5\' 5"  (1.651 m)   Wt 74.8 kg   SpO2 94%   BMI 27.44 kg/m   Examination:   General: Appearance:     Overweight female in no acute distress     Lungs:     respirations unlabored  Heart:    Normal heart rate. Normal rhythm. No murmurs, rubs, or gallops.   MS:   All extremities are intact.   Neurologic:   Awake, alert, pleasant and cooperative      Data Reviewed: I have personally reviewed following labs and imaging studies  CBC Lab Results  Component Value Date   WBC 3.8 (L) 03/20/2023   RBC 3.37 (L) 03/20/2023   HGB 8.7 (L) 03/20/2023   HCT 28.5 (L) 03/20/2023   MCV 84.6 03/20/2023   MCH 25.8 (L) 03/20/2023   PLT 182 03/20/2023   MCHC 30.5 03/20/2023   RDW 15.8 (H) 03/20/2023   LYMPHSABS 0.5 (L) 03/14/2023   MONOABS 0.2 03/14/2023   EOSABS 0.0 03/14/2023   BASOSABS 0.0 03/14/2023     Last metabolic panel Lab Results  Component Value Date   NA 139 03/20/2023   K 3.8 03/20/2023   CL 102 03/20/2023   CO2 28 03/20/2023   BUN 21 03/20/2023   CREATININE 0.56 03/20/2023   GLUCOSE 272 (H) 03/20/2023   GFRNONAA  >60 03/20/2023   GFRAA >60 12/06/2019   CALCIUM 8.7 (L) 03/20/2023   PHOS 1.9 (L) 11/27/2020   PROT 5.8 (L) 03/14/2023   ALBUMIN 2.5 (L) 03/14/2023   BILITOT 0.1 (L) 03/14/2023   ALKPHOS 76 03/14/2023   AST 15 03/14/2023   ALT 13 03/14/2023   ANIONGAP 9 03/20/2023    GFR: Estimated Creatinine Clearance: 67.2 mL/min (by C-G formula based on SCr of 0.56 mg/dL).  Recent Results (from the past 240 hour(s))  Urine Culture     Status: Abnormal   Collection Time: 03/13/23  9:25 PM   Specimen: Urine, Clean Catch  Result Value Ref Range Status   Specimen Description   Final    URINE, CLEAN CATCH Performed at [facility], [address], [place], [place] [address]    Special Requests   Final    NONE Reflexed from [id number] Performed at [facility], [address] [place], [place] [address]    Culture (A)  Final    >=100,000 COLONIES/mL KLEBSIELLA PNEUMONIAE Confirmed Extended Spectrum Beta-Lactamase Producer (ESBL).  In bloodstream infections from ESBL organisms, carbapenems are preferred over piperacillin/tazobactam. They are shown to have a lower risk of mortality.    Report Status 03/16/2023 FINAL  Final   Organism ID, Bacteria KLEBSIELLA PNEUMONIAE (A)  Final      Susceptibility   Klebsiella pneumoniae - MIC*    AMPICILLIN >=32 RESISTANT Resistant     CEFAZOLIN >=64  RESISTANT Resistant     CEFEPIME >=32 RESISTANT Resistant     CEFTRIAXONE >=64 RESISTANT Resistant     CIPROFLOXACIN 1 RESISTANT Resistant     GENTAMICIN >=16 RESISTANT Resistant     IMIPENEM <=0.25 SENSITIVE Sensitive     NITROFURANTOIN 64 INTERMEDIATE Intermediate     TRIMETH/SULFA >=320 RESISTANT Resistant     AMPICILLIN/SULBACTAM >=32 RESISTANT Resistant     PIP/TAZO 16 SENSITIVE Sensitive     * >=100,000 COLONIES/mL KLEBSIELLA PNEUMONIAE  Culture, blood (routine x 2)     Status: None   Collection Time: 03/13/23  9:40 PM   Specimen: BLOOD  Result Value Ref Range Status   Specimen Description BLOOD  BLOOD LEFT ARM  Final   Special Requests   Final    BOTTLES DRAWN AEROBIC AND ANAEROBIC Blood Culture results may not be optimal due to an inadequate volume of blood received in culture bottles   Culture   Final    NO GROWTH 5 DAYS Performed at [facility], [address] [place], [place] [address]    Report Status 03/18/2023 FINAL  Final  Culture, blood (routine x 2)     Status: None   Collection Time: 03/13/23  9:42 PM   Specimen: BLOOD  Result Value Ref Range Status   Specimen Description BLOOD BLOOD RIGHT HAND  Final   Special Requests   Final    BOTTLES DRAWN AEROBIC AND ANAEROBIC Blood Culture adequate volume   Culture   Final    NO GROWTH 5 DAYS Performed at [facility], [address] [place], [place] [address]    Report Status 03/18/2023 FINAL  Final  SARS Coronavirus 2 by RT PCR (hospital order, performed in [facility] hospital lab) *cepheid single result test* Anterior Nasal Swab     Status: None   Collection Time: 03/14/23  1:45 AM   Specimen: Anterior Nasal Swab  Result Value Ref Range Status   SARS Coronavirus 2 by RT PCR NEGATIVE NEGATIVE Final    Comment: (NOTE) SARS-CoV-2 target nucleic acids are NOT DETECTED.  The SARS-CoV-2 RNA is generally detectable in upper and lower respiratory specimens during the acute phase of infection. The lowest concentration of SARS-CoV-2 viral copies this assay can detect is 250 copies / mL. A negative result does not preclude SARS-CoV-2 infection and should not be used as the sole basis for treatment or other patient management decisions.  A negative result may occur with improper specimen collection / handling, submission of specimen other than nasopharyngeal swab, presence of viral mutation(s) within the areas targeted by this assay, and inadequate number of viral copies (<250 copies / mL). A negative result must be combined with clinical observations, patient history, and epidemiological information.  Fact Sheet for  Patients:   RoadLapTop.co.za  Fact Sheet for Healthcare Providers: http://kim-miller.com/  This test is not yet approved or  cleared by the [place] FDA and has been authorized for detection and/or diagnosis of SARS-CoV-2 by FDA under an Emergency Use Authorization (EUA).  This EUA will remain in effect (meaning this test can be used) for the duration of the COVID-19 declaration under Section 564(b)(1) of the Act, 21 U.S.C. section 360bbb-3(b)(1), unless the authorization is terminated or revoked sooner.  Performed at [facility], [address] [place], [place] [address]       Radiology Studies: DG CHEST PORT 1 VIEW  Result Date: 03/18/2023 CLINICAL DATA:  Acute respiratory failure with hypoxia EXAM: PORTABLE CHEST 1 VIEW COMPARISON:  Radiographs 03/13/2023 FINDINGS:  Stable cardiomediastinal silhouette. Similar bilateral interstitial coarsening. Patchy areas of atelectasis or infiltrate are unchanged. Small right pleural effusion. No pneumothorax. IMPRESSION: 1. Unchanged bilateral interstitial coarsening and patchy areas of atelectasis or pneumonia. 2. New small right pleural effusion. Electronically Signed   By: Minerva Fester M.D.   On: 03/18/2023 17:59      LOS: 6 days    Marlin Canary DO [facility] 03/20/2023, 11:01 AM   If 7PM-7AM, please contact night-coverage [email or web address]

## 2023-03-20 NOTE — Telephone Encounter (Signed)
Form from [facility] faxed to [phone] on 03/19/2023

## 2023-03-20 NOTE — Inpatient Diabetes Management (Signed)
Inpatient Diabetes Program Recommendations  AACE/ADA: New Consensus Statement on Inpatient Glycemic Control   Target Ranges:  Prepandial:   less than 140 mg/dL      Peak postprandial:   less than 180 mg/dL (1-2 hours)      Critically ill patients:  140 - 180 mg/dL    Latest Reference Range & Units 03/19/23 08:35 03/19/23 11:19 03/19/23 16:31 03/19/23 21:02 03/19/23 22:05 03/19/23 22:13 03/20/23 07:31  Glucose-Capillary 70 - 99 mg/dL 045 (H)  Novolog 15 units   Semglee 30 units 277 (H)  Novolog 8 units 70 44 (LL) 121 (H) 147 (H) 231 (H)   Review of Glycemic Control  Diabetes history: DM2 Outpatient Diabetes medications: 70/30 25 units BID Current orders for Inpatient glycemic control: Semglee 30 units daily, Novolog 0-15 units TID with meals  Inpatient Diabetes Program Recommendations:    Insulin: Glucose down to 70 mg/dl at 40:98 and 44 mg/dl at 11:91.  Please consider decreasing Semglee to 20 units daily and Novolog 4 units TID with meals for meal coverage if patient eats at least 50% of meals.  Thanks, Orlando Penner, RN, MSN, CDCES Diabetes Coordinator Inpatient Diabetes Program [phone] (Team Pager from 8am to 5pm)

## 2023-03-21 DIAGNOSIS — L97519 Non-pressure chronic ulcer of other part of right foot with unspecified severity: Secondary | ICD-10-CM | POA: Diagnosis not present

## 2023-03-21 DIAGNOSIS — E1022 Type 1 diabetes mellitus with diabetic chronic kidney disease: Secondary | ICD-10-CM

## 2023-03-21 DIAGNOSIS — E162 Hypoglycemia, unspecified: Secondary | ICD-10-CM | POA: Diagnosis not present

## 2023-03-21 LAB — GLUCOSE, CAPILLARY
Glucose-Capillary: 318 mg/dL — ABNORMAL HIGH (ref 70–99)
Glucose-Capillary: 207 mg/dL — ABNORMAL HIGH (ref 70–99)

## 2023-03-21 MED ORDER — MEDIHONEY WOUND/BURN DRESSING EX PSTE: 1.0000 | PASTE | Freq: Every day | CUTANEOUS | Status: DC

## 2023-03-21 MED ORDER — ACETAMINOPHEN 325 MG PO TABS: 650.0000 mg | ORAL_TABLET | Freq: Four times a day (QID) | ORAL | Status: DC | PRN

## 2023-03-21 MED ORDER — NYSTATIN 100000 UNIT/GM EX POWD
Freq: Three times a day (TID) | CUTANEOUS | Status: DC
  Filled 2023-03-21: qty 15

## 2023-03-21 MED ORDER — ARIPIPRAZOLE 15 MG PO TABS: 15.0000 mg | ORAL_TABLET | Freq: Two times a day (BID) | ORAL | Status: DC

## 2023-03-21 MED ORDER — NYSTATIN 100000 UNIT/GM EX POWD: Freq: Three times a day (TID) | CUTANEOUS | 0 refills | Status: DC

## 2023-03-21 MED ORDER — INSULIN ASPART 100 UNIT/ML IJ SOLN: 4.0000 [IU] | Freq: Three times a day (TID) | INTRAMUSCULAR | 11 refills | Status: DC

## 2023-03-21 MED ORDER — HALOPERIDOL 1 MG PO TABS: 1.0000 mg | ORAL_TABLET | Freq: Two times a day (BID) | ORAL | Status: DC

## 2023-03-21 MED ORDER — INSULIN ASPART 100 UNIT/ML IJ SOLN: 0.0000 [IU] | Freq: Three times a day (TID) | INTRAMUSCULAR | 11 refills | Status: DC

## 2023-03-21 MED ORDER — INSULIN GLARGINE-YFGN 100 UNIT/ML ~~LOC~~ SOLN: 20.0000 [IU] | Freq: Every day | SUBCUTANEOUS | 11 refills | Status: DC

## 2023-03-21 MED ORDER — ADULT MULTIVITAMIN W/MINERALS CH: 1.0000 | ORAL_TABLET | Freq: Every day | ORAL | Status: DC

## 2023-03-21 NOTE — Progress Notes (Signed)
OT Cancellation Note  Patient Details Name: [name] MRN: [id number] DOB: [birth year]   Cancelled Treatment:    Reason Eval/Treat Not Completed: OT screened, no needs identified, will sign off. Discussed pt with evaluating physical therapist. Pt reportedly did well and ambulated independently. Pt will be removed from OT list. Thank you.   [name] OT, MOT   [name] 03/21/2023, 9:07 AM

## 2023-03-21 NOTE — TOC Transition Note (Addendum)
Transition of Care [facility]) - CM/SW Discharge Note   Patient Details  Name: [name] MRN: [id number] Date of Birth: Jun 28, 1951  Transition of Care [facility]) CM/SW Contact:  Karn Cassis, LCSW Phone Number: 03/21/2023, 1:38 PM   Clinical Narrative: Pt d/c today to [facility]. Pt's daughter, significant other, and SNF aware and agreeable. Family to provide transportation. D/C summary sent to SNF. RN given number to call report.     LCSW unable to find IVC paperwork on chart. LCSW called [facility] clerk of court office and spoke with Irving Burton who confirms no IVC paperwork was received on patient. MD and RN updated.   Final next level of care: Skilled Nursing Facility Barriers to Discharge: Barriers Resolved   Patient Goals and CMS Choice      Discharge Placement                Patient chooses bed at: Other - please specify in the comment section below: [facility]) Patient to be transferred to facility by: significant other Name of family member notified: daughter and significant other Patient and family notified of of transfer: 03/21/23  Discharge Plan and Services Additional resources added to the After Visit Summary for   In-house Referral: Clinical Social Work                                   Social Determinants of Health (SDOH) Interventions SDOH Screenings   Food Insecurity: No Food Insecurity (03/14/2023)  Housing: Low Risk  (03/14/2023)  Transportation Needs: No Transportation Needs (03/14/2023)  Utilities: Not At Risk (03/14/2023)  Depression (PHQ2-9): Low Risk  (02/07/2023)  Recent Concern: Depression (PHQ2-9) - Medium Risk (12/18/2022)  Tobacco Use: Medium Risk (03/13/2023)     Readmission Risk Interventions    11/30/2020   11:08 AM 11/29/2020   10:33 AM  Readmission Risk Prevention Plan  Transportation Screening  Complete  PCP or Specialist Appt within 5-7 Days Complete   PCP or Specialist Appt within 3-5 Days  Complete   Home Care Screening Complete   Medication Review (RN CM) Complete   HRI or Home Care Consult  Complete  Social Work Consult for Recovery Care Planning/Counseling  Complete  Palliative Care Screening  Not Applicable  Medication Review Oceanographer)  Complete

## 2023-03-21 NOTE — Care Management Important Message (Signed)
Important Message  Patient Details  Name: [name] MRN: [id number] Date of Birth: July 05, 1951   Medicare Important Message Given:  Yes     Corey Harold 03/21/2023, 12:34 PM

## 2023-03-21 NOTE — Discharge Summary (Addendum)
Physician Discharge Summary  [name] ZOX:[id number] DOB: September 25, 1951 DOA: 03/13/2023  PCP: Ardith Dark, MD  Admit date: 03/13/2023 Discharge date: 03/21/2023  Admitted From: home Discharge disposition: SNF   Recommendations for Outpatient Follow-Up:   Wound care Monitor blood sugars closely-- labile   Discharge Diagnosis:   Active Problems:   Mood disorder (HCC)   CAP (community acquired pneumonia)   Dyslipidemia associated with type 2 diabetes mellitus (HCC)   Diabetic foot ulcer (HCC)   Acute respiratory failure with hypoxia (HCC)   UTI (urinary tract infection)   Cellulitis of right foot   Normocytic anemia   Pulmonary edema    Discharge Condition: Improved.  Diet recommendation: Low sodium, heart healthy.  Carbohydrate-modified.  Wound care: see below  Code status: DNR   History of Present Illness:   [name] is a 72 y.o. female with medical history significant of depression, diabetes mellitus, hyperlipidemia, hypertension, schizophrenia, and more presents to the ED with a chief complaint of altered mental status.  Unfortunately, patient does not remember much of the episode.  After speaking with the ED staff and reviewing the chart, it seems that patient was seen yesterday for a diabetic foot ulcer and prescribed Bactrim.  She has not yet started it.  Today she was found unresponsive, questionably outside, and family reported to nursing staff that they gave her her shot of insulin when they found her this way.  Patient herself reports that they do not normally check her sugar before they administer insulin.  She takes her insulin every day per her report, and she checks her glucose approximately once per week.  She reports that her glucose runs about 120, but when advised that a hemoglobin A1c of 14 does not line up with a glucose of 120, she readily says she does not check her glucose as often as she should.  When paramedics got there  they found her to have a glucose of 42.  They were not able to get a vein so they drilled the IO to give her D10 per the ED per the ED report.  As the glucose improved to the patient's mentation.  She reports remembering people talking about her glucose being low but she does not remember being unresponsive, or what happened immediately before being unresponsive.  The only complaint that she had from earlier in the day was a pain in her wrist on the right.  Unfortunately she is not able to provide any more history about this specific episode.   Patient reports that she is taking [place] and NovoLog at home.  She is not sure of the dosages.  She reports she has had a right foot infection that she is not sure how long it has been there.  Patient reports that she has sensation in her feet, and has had a dull ache at the site of the ulcer for the last 2-3 days.  Appearance of the ulcer seems as though it has been there much longer.  She denies any fever, cough, dyspnea.  She denies any urinary symptoms.  Patient reports for last 3-4 days she has been progressively more fatigued and had general malaise.  Patient has no other complaints at this time.   Patient does not smoke, does not drink, does not use illicit drugs.  She is not vaccinated.  Patient reports that she would prefer to have a natural death if something happened where her heart stopped beating.   Hospital  Course by Problem:   Sepsis (HCC)-resolved as of 03/17/2023 Present on admission. Secondary to cellulitis and infected foot ulcer. Urinalysis also concerning for UTI and chest x-ray concerning for possible pneumonia. Empiric antibiotics started. Blood and urine cultures obtained. Urine culture significant for ESBL Klebsiella infection and blood cultures are no growth to date.   Pulmonary edema Mild. Associated hypoxia. -Lasix 40 mg IV x2 with improvement   Normocytic anemia Chronic. Stable. Iron panel consistent with possible anemia of  chronic disease. Vitamin B12 is low-normal-- follow outpatient    Cellulitis of right foot Secondary to infected diabetic foot ulcer. MRI obtained and is without evidence of underlying abscess or osteomyelitis. Cellulitis improving. -Continue meropenem for associated ESBL Klebsiella pneumoniae UTI x 5 days   UTI (urinary tract infection) Urinalysis highly suggestive of a UTI. Urine culture obtained and is significant for ESBL Klebsiella Pneumoniae. -Continue meropenem and treat for 5 days total   Acute respiratory failure with hypoxia (HCC) Secondary to volume overload- lasix x 2   Diabetic foot ulcer (HCC) Patient with good pulses. ABI normal except with DP/PT artery waveforms suspicious for underlying disease. Recommendation for CT angiography of bilateral lower extremities recommended. CRP normal. ESR mildly elevated. X-ray and MRI negative for osteomyelitis. No debridement per general surgery. -Wound care recommendations (4/26): Topical treatment orders provided for bedside nurses to perform as follows to assist with removal of nonviable tissue: Apply Medihoney to right plantar foot wound Q day, then cover with foam dressing. Change foam dressing Q 3 days or PRN soiling.    Dyslipidemia associated with type 2 diabetes mellitus (HCC) Patient is managed on Novolog mix 70/30 25 units BID as an outpatient. Patient presented with hypoglycemia. Last hemoglobin A1C of 14.1% from 01/2023. SSI restarted on admission. Blood sugar trending up on SSI management only. Continued uncontrolled blood sugar. -Continue SSI-- decrease long acting and add meal coverage -will need close monitoring at rehab   CAP (community acquired pneumonia) Multifocal pneumonia suggested on imaging. Empiric Vancomycin and Cefepime started on admission. Blood cultures and strep pneumo/legionella ur antigen testing ordered. Blood cultures with no growth to date. Vancomycin/Cefepime transitioned to meropenem to cover for ESBL  UTI-- finished treatment   Mood disorder (HCC) -was not IVC'd. -Continue Abilify, Wellbutrin, Haldol, Cogentin -Psychiatry consulted: No evidence of imminent risk to self or others at present.   Patient does not meet criteria for psychiatric inpatient admission. Supportive therapy provided about ongoing stressors.   Fever-resolved as of 03/19/2023 Recurrent fever. Tmax of 102 F overnight. No associated symptoms per patient. Urine culture significant for UTI, however no presence of bacteremia; Klebsiella is identified as ESBL. RVP ordered and still pending. Procalcitonin is trending downward. Fever curve is improving with antibiotic change. Fevers appear to have now resolved.   Hypothermia-resolved as of 03/14/2023 In setting of sepsis, but more likely related to hypoglycemia. Patient improved with the use of an active heated air blanket. Resolved.   Hypoglycemia-resolved as of 03/15/2023 Presenting issue. Patient was found to be unresponsive with a blood glucose of 42 and responded to D10. -sugars are labile     Medical Consultants:   Psych GS   Discharge Exam:   Vitals:   03/20/23 2100 03/21/23 0404  BP: (!) 163/71 (!) 153/74  Pulse: 75 73  Resp: 18 18  Temp: 98.8 F (37.1 C) 98.7 F (37.1 C)  SpO2: 97% 99%   Vitals:   03/20/23 0853 03/20/23 1339 03/20/23 2100 03/21/23 0404  BP: 132/82 138/63 (!) 163/71 Marland Kitchen)  153/74  Pulse: 76 75 75 73  Resp: 18 18 18 18   Temp: 98.6 F (37 C) (!) 97.5 F (36.4 C) 98.8 F (37.1 C) 98.7 F (37.1 C)  TempSrc: Oral  Oral Oral  SpO2: 94% 96% 97% 99%  Weight:      Height:        General exam: Appears calm and comfortable.   The results of significant diagnostics from this hospitalization (including imaging, microbiology, ancillary and laboratory) are listed below for reference.     Procedures and Diagnostic Studies:   US ARTERIAL ABI (SCREENING LOWER EXTREMITY)  Result Date: 03/14/2023 CLINICAL DATA:  Diabetic foot ulcer EXAM:  NONINVASIVE PHYSIOLOGIC VASCULAR STUDY OF BILATERAL LOWER EXTREMITIES TECHNIQUE: Evaluation of both lower extremities were performed at rest, including calculation of ankle-brachial indices with single level pressure measurements and doppler recording. COMPARISON:  None available. FINDINGS: Right ABI:  1.29 Left ABI:  1.28 Right Lower Extremity: Monophasic posterior tibial and dorsalis pedis artery waveforms. Left Lower Extremity: Monophasic posterior tibial and dorsalis pedis artery waveforms. IMPRESSION: Although ankle-brachial indices are within normal limits, bilateral monophasic dorsalis pedis and posterior tibial artery waveforms are suspicious for underlying arterial occlusive disease. Further evaluation with CT angiography of the lower extremities would be beneficial. Electronically Signed   By: Acquanetta Belling M.D.   On: 03/14/2023 10:58   MR FOOT RIGHT W WO CONTRAST  Result Date: 03/14/2023 CLINICAL DATA:  Right foot pain.  Open wound. EXAM: MRI OF THE RIGHT FOREFOOT WITHOUT AND WITH CONTRAST TECHNIQUE: Multiplanar, multisequence MR imaging of the right foot was performed before and after the administration of intravenous contrast. CONTRAST:  7mL GADAVIST GADOBUTROL 1 MMOL/ML IV SOLN COMPARISON:  Radiographs 03/14/2023 FINDINGS: There is an open wound noted on the plantar aspect of the forefoot between the first and second toes. No subcutaneous abscess is identified. There is diffuse severe cellulitis involving the entire foot. Mild diffuse myofasciitis but no findings for pyomyositis. No MR findings suspicious for septic arthritis or osteomyelitis. The major tendons and ligaments appear intact. IMPRESSION: 1. Open wound on the plantar aspect of the forefoot between the first and second toes. No subcutaneous abscess. 2. Diffuse severe cellulitis involving the entire foot. Mild diffuse myofasciitis but no findings for pyomyositis. 3. No MR findings suspicious for septic arthritis or osteomyelitis.  Electronically Signed   By: Rudie Meyer M.D.   On: 03/14/2023 09:43   DG Foot Complete Right  Result Date: 03/14/2023 CLINICAL DATA:  Foot ulcer EXAM: RIGHT FOOT COMPLETE - 3+ VIEW COMPARISON:  None Available. FINDINGS: There is no evidence of fracture or dislocation. There is no evidence of arthropathy or other focal bone abnormality. Soft tissues are unremarkable. IMPRESSION: Negative. Electronically Signed   By: Charlett Nose M.D.   On: 03/14/2023 00:48   DG Chest Port 1 View  Result Date: 03/13/2023 CLINICAL DATA:  Unresponsive. EXAM: PORTABLE CHEST 1 VIEW COMPARISON:  February 25, 2023 FINDINGS: The heart size and mediastinal contours are within normal limits. Mild to moderate severity diffusely increased interstitial lung markings are seen. This is mildly increased in severity when compared to the prior study. Mild superimposed areas of atelectasis and/or infiltrate are noted within the lateral aspect of the right upper lobe and left lung base. There is no evidence of a pleural effusion or pneumothorax. The visualized skeletal structures are unremarkable. IMPRESSION: Mild right upper lobe and left basilar airspace disease with a mild superimposed component of interstitial edema. Electronically Signed   By: Waylan Rocher  [place] M.D.   On: 03/13/2023 22:03     Labs:   Basic Metabolic Panel: Recent Labs  Lab 03/17/23 0439 03/20/23 0933  NA 132* 139  K 4.1 3.8  CL 103 102  CO2 20* 28  GLUCOSE 359* 272*  BUN 19 21  CREATININE 0.64 0.56  CALCIUM 8.2* 8.7*   GFR Estimated Creatinine Clearance: 67.2 mL/min (by C-G formula based on SCr of 0.56 mg/dL). Liver Function Tests: No results for input(s): "AST", "ALT", "ALKPHOS", "BILITOT", "PROT", "ALBUMIN" in the last 168 hours. No results for input(s): "LIPASE", "AMYLASE" in the last 168 hours. No results for input(s): "AMMONIA" in the last 168 hours. Coagulation profile No results for input(s): "INR", "PROTIME" in the last 168  hours.  CBC: Recent Labs  Lab 03/15/23 0439 03/16/23 0500 03/17/23 0439 03/20/23 0933  WBC 4.8 4.8 3.1* 3.8*  HGB 9.0* 8.8* 9.2* 8.7*  HCT 29.2* 27.8* 29.9* 28.5*  MCV 85.9 85.0 85.4 84.6  PLT 166 156 160 182   Cardiac Enzymes: No results for input(s): "CKTOTAL", "CKMB", "CKMBINDEX", "TROPONINI" in the last 168 hours. BNP: Invalid input(s): "POCBNP" CBG: Recent Labs  Lab 03/20/23 1135 03/20/23 1627 03/20/23 1700 03/20/23 2044 03/21/23 0742  GLUCAP 225* 52* 119* 259* 318*   D-Dimer No results for input(s): "DDIMER" in the last 72 hours. Hgb A1c No results for input(s): "HGBA1C" in the last 72 hours. Lipid Profile No results for input(s): "CHOL", "HDL", "LDLCALC", "TRIG", "CHOLHDL", "LDLDIRECT" in the last 72 hours. Thyroid function studies No results for input(s): "TSH", "T4TOTAL", "T3FREE", "THYROIDAB" in the last 72 hours.  Invalid input(s): "FREET3" Anemia work up No results for input(s): "VITAMINB12", "FOLATE", "FERRITIN", "TIBC", "IRON", "RETICCTPCT" in the last 72 hours. Microbiology Recent Results (from the past 240 hour(s))  Urine Culture     Status: Abnormal   Collection Time: 03/13/23  9:25 PM   Specimen: Urine, Clean Catch  Result Value Ref Range Status   Specimen Description   Final    URINE, CLEAN CATCH Performed at [facility], [address], [place], [place] [address]    Special Requests   Final    NONE Reflexed from [id number] Performed at [facility], [address] [place], [place] [address]    Culture (A)  Final    >=100,000 COLONIES/mL KLEBSIELLA PNEUMONIAE Confirmed Extended Spectrum Beta-Lactamase Producer (ESBL).  In bloodstream infections from ESBL organisms, carbapenems are preferred over piperacillin/tazobactam. They are shown to have a lower risk of mortality.    Report Status 03/16/2023 FINAL  Final   Organism ID, Bacteria KLEBSIELLA PNEUMONIAE (A)  Final      Susceptibility   Klebsiella pneumoniae - MIC*    AMPICILLIN  >=32 RESISTANT Resistant     CEFAZOLIN >=64 RESISTANT Resistant     CEFEPIME >=32 RESISTANT Resistant     CEFTRIAXONE >=64 RESISTANT Resistant     CIPROFLOXACIN 1 RESISTANT Resistant     GENTAMICIN >=16 RESISTANT Resistant     IMIPENEM <=0.25 SENSITIVE Sensitive     NITROFURANTOIN 64 INTERMEDIATE Intermediate     TRIMETH/SULFA >=320 RESISTANT Resistant     AMPICILLIN/SULBACTAM >=32 RESISTANT Resistant     PIP/TAZO 16 SENSITIVE Sensitive     * >=100,000 COLONIES/mL KLEBSIELLA PNEUMONIAE  Culture, blood (routine x 2)     Status: None   Collection Time: 03/13/23  9:40 PM   Specimen: BLOOD  Result Value Ref Range Status   Specimen Description BLOOD BLOOD LEFT ARM  Final   Special Requests   Final  BOTTLES DRAWN AEROBIC AND ANAEROBIC Blood Culture results may not be optimal due to an inadequate volume of blood received in culture bottles   Culture   Final    NO GROWTH 5 DAYS Performed at [facility], [address], [place], [place] [address]    Report Status 03/18/2023 FINAL  Final  Culture, blood (routine x 2)     Status: None   Collection Time: 03/13/23  9:42 PM   Specimen: BLOOD  Result Value Ref Range Status   Specimen Description BLOOD BLOOD RIGHT HAND  Final   Special Requests   Final    BOTTLES DRAWN AEROBIC AND ANAEROBIC Blood Culture adequate volume   Culture   Final    NO GROWTH 5 DAYS Performed at [facility], [address], [place], [place] [address]    Report Status 03/18/2023 FINAL  Final  SARS Coronavirus 2 by RT PCR (hospital order, performed in [facility] hospital lab) *cepheid single result test* Anterior Nasal Swab     Status: None   Collection Time: 03/14/23  1:45 AM   Specimen: Anterior Nasal Swab  Result Value Ref Range Status   SARS Coronavirus 2 by RT PCR NEGATIVE NEGATIVE Final    Comment: (NOTE) SARS-CoV-2 target nucleic acids are NOT DETECTED.  The SARS-CoV-2 RNA is generally detectable in upper and lower respiratory specimens during the acute  phase of infection. The lowest concentration of SARS-CoV-2 viral copies this assay can detect is 250 copies / mL. A negative result does not preclude SARS-CoV-2 infection and should not be used as the sole basis for treatment or other patient management decisions.  A negative result may occur with improper specimen collection / handling, submission of specimen other than nasopharyngeal swab, presence of viral mutation(s) within the areas targeted by this assay, and inadequate number of viral copies (<250 copies / mL). A negative result must be combined with clinical observations, patient history, and epidemiological information.  Fact Sheet for Patients:   RoadLapTop.co.za  Fact Sheet for Healthcare Providers: http://kim-miller.com/  This test is not yet approved or  cleared by the [place] FDA and has been authorized for detection and/or diagnosis of SARS-CoV-2 by FDA under an Emergency Use Authorization (EUA).  This EUA will remain in effect (meaning this test can be used) for the duration of the COVID-19 declaration under Section 564(b)(1) of the Act, 21 U.S.C. section 360bbb-3(b)(1), unless the authorization is terminated or revoked sooner.  Performed at [facility], [address] [place], [place] [address]      Discharge Instructions:   Discharge Instructions     Ambulatory referral to Nutrition and Diabetic Education   Complete by: As directed    Admitted with hypoglycemia; unsure which insulins pt is taking or dose. Insulin recently changed from [place] to Novolog 70/30 at hospital discharge on 03/02/23. In discussing hypoglycemia, pt stated if sugar is low she takes insulin. Stressed hypoglycemia treatment with pt. Please follow up and provide further education if needed.   Diet - low sodium heart healthy   Complete by: As directed    Diet Carb Modified   Complete by: As directed    Discharge wound care:    Complete by: As directed    Apply Medihoney to right plantar foot wound Q day, then cover with foam dressing. Change foam dressing Q 3 days or PRN soiling.   Increase activity slowly   Complete by: As directed       Allergies as of 03/21/2023  Reactions   Penicillins Rash, Hives   Did it involve swelling of the face/tongue/throat, SOB, or low BP? Unknown Did it involve sudden or severe rash/hives, skin peeling, or any reaction on the inside of your mouth or nose? Unknown Did you need to seek medical attention at a hospital or doctor's office? Unknown When did it last happen?       If all above answers are "NO", may proceed with cephalosporin use. Did it involve swelling of the face/tongue/throat, SOB, or low BP? Unknown Did it involve sudden or severe rash/hives, skin peeling, or any reaction on the inside of your mouth or nose? Unknown Did you need to seek medical attention at a hospital or doctor's office? Unknown When did it last happen?       If all above answers are "NO", may proceed with cephalosporin use.        Medication List     STOP taking these medications    insulin aspart protamine- aspart (70-30) 100 UNIT/ML injection Commonly known as: NOVOLOG MIX 70/30   metoprolol succinate 25 MG 24 hr tablet Commonly known as: TOPROL-XL   Oysco 500 500 MG Tabs Generic drug: Oyster Shell Calcium   sulfamethoxazole-trimethoprim 800-160 MG tablet Commonly known as: BACTRIM DS       TAKE these medications    acetaminophen 325 MG tablet Commonly known as: TYLENOL Take 2 tablets (650 mg total) by mouth every 6 (six) hours as needed for mild pain (or Fever >/= 101).   albuterol 108 (90 Base) MCG/ACT inhaler Commonly known as: VENTOLIN HFA TAKE 2 PUFFS BY MOUTH EVERY 6 HOURS AS NEEDED FOR WHEEZE OR SHORTNESS OF BREATH   ARIPiprazole 15 MG tablet Commonly known as: ABILIFY Take 1 tablet (15 mg total) by mouth 2 (two) times daily. What changed:  medication  strength how much to take when to take this   aspirin EC 81 MG tablet Take by mouth.   benztropine 1 MG tablet Commonly known as: COGENTIN Take 1 tablet (1 mg total) by mouth at bedtime.   buPROPion 75 MG tablet Commonly known as: WELLBUTRIN Take 1 tablet (75 mg total) by mouth 2 (two) times daily.   Dexcom G6 Receiver Devi Use as directed for continuous glucose monitoring.   FreeStyle Libre 2 Sensor Misc 1 each by Does not apply route every 14 (fourteen) days.   haloperidol 1 MG tablet Commonly known as: HALDOL Take 1 tablet (1 mg total) by mouth 2 (two) times daily. What changed: when to take this   insulin aspart 100 UNIT/ML injection Commonly known as: novoLOG Inject 0-15 Units into the skin 3 (three) times daily with meals.   insulin aspart 100 UNIT/ML injection Commonly known as: novoLOG Inject 4 Units into the skin 3 (three) times daily with meals.   insulin glargine-yfgn 100 UNIT/ML injection Commonly known as: SEMGLEE Inject 0.2 mLs (20 Units total) into the skin daily.   Insulin Pen Needle 32G X 4 MM Misc Use with Insulin pen   INSULIN SYRINGE .5CC/31GX5/16" 31G X 5/16" 0.5 ML Misc Use as directed   leptospermum manuka honey Pste paste Apply 1 Application topically daily.   multivitamin with minerals Tabs tablet Take 1 tablet by mouth daily.   nystatin powder Commonly known as: MYCOSTATIN/NYSTOP Apply topically 3 (three) times daily.               Discharge Care Instructions  (From admission, onward)           Start  Ordered   03/21/23 0000  Discharge wound care:       Comments: Apply Medihoney to right plantar foot wound Q day, then cover with foam dressing. Change foam dressing Q 3 days or PRN soiling.   03/21/23 0846              Time coordinating discharge:  45 min Signed:  Joseph Art DO  [facility] 03/21/2023, 9:17 AM

## 2023-03-21 NOTE — Plan of Care (Signed)
Problem: Education: Goal: Ability to describe self-care measures that may prevent or decrease complications (Diabetes Survival Skills Education) will improve Outcome: Adequate for Discharge Goal: Individualized Educational Video(s) Outcome: Adequate for Discharge   Problem: Cardiac: Goal: Ability to maintain an adequate cardiac output will improve Outcome: Adequate for Discharge   Problem: Health Behavior/Discharge Planning: Goal: Ability to identify and utilize available resources and services will improve Outcome: Adequate for Discharge Goal: Ability to manage health-related needs will improve Outcome: Adequate for Discharge   Problem: Fluid Volume: Goal: Ability to achieve a balanced intake and output will improve Outcome: Adequate for Discharge   Problem: Metabolic: Goal: Ability to maintain appropriate glucose levels will improve Outcome: Adequate for Discharge   Problem: Nutritional: Goal: Maintenance of adequate nutrition will improve Outcome: Adequate for Discharge Goal: Maintenance of adequate weight for body size and type will improve Outcome: Adequate for Discharge   Problem: Respiratory: Goal: Will regain and/or maintain adequate ventilation Outcome: Adequate for Discharge   Problem: Urinary Elimination: Goal: Ability to achieve and maintain adequate renal perfusion and functioning will improve Outcome: Adequate for Discharge   Problem: Education: Goal: Ability to describe self-care measures that may prevent or decrease complications (Diabetes Survival Skills Education) will improve Outcome: Adequate for Discharge Goal: Individualized Educational Video(s) Outcome: Adequate for Discharge   Problem: Coping: Goal: Ability to adjust to condition or change in health will improve Outcome: Adequate for Discharge   Problem: Fluid Volume: Goal: Ability to maintain a balanced intake and output will improve Outcome: Adequate for Discharge   Problem: Health  Behavior/Discharge Planning: Goal: Ability to identify and utilize available resources and services will improve Outcome: Adequate for Discharge Goal: Ability to manage health-related needs will improve Outcome: Adequate for Discharge   Problem: Metabolic: Goal: Ability to maintain appropriate glucose levels will improve Outcome: Adequate for Discharge   Problem: Nutritional: Goal: Maintenance of adequate nutrition will improve Outcome: Adequate for Discharge Goal: Progress toward achieving an optimal weight will improve Outcome: Adequate for Discharge   Problem: Skin Integrity: Goal: Risk for impaired skin integrity will decrease Outcome: Adequate for Discharge   Problem: Tissue Perfusion: Goal: Adequacy of tissue perfusion will improve Outcome: Adequate for Discharge   Problem: Fluid Volume: Goal: Hemodynamic stability will improve Outcome: Adequate for Discharge   Problem: Clinical Measurements: Goal: Diagnostic test results will improve Outcome: Adequate for Discharge Goal: Signs and symptoms of infection will decrease Outcome: Adequate for Discharge   Problem: Respiratory: Goal: Ability to maintain adequate ventilation will improve Outcome: Adequate for Discharge   Problem: Education: Goal: Knowledge of General Education information will improve Description: Including pain rating scale, medication(s)/side effects and non-pharmacologic comfort measures Outcome: Adequate for Discharge   Problem: Health Behavior/Discharge Planning: Goal: Ability to manage health-related needs will improve Outcome: Adequate for Discharge   Problem: Clinical Measurements: Goal: Ability to maintain clinical measurements within normal limits will improve Outcome: Adequate for Discharge Goal: Will remain free from infection Outcome: Adequate for Discharge Goal: Diagnostic test results will improve Outcome: Adequate for Discharge Goal: Respiratory complications will improve Outcome:  Adequate for Discharge Goal: Cardiovascular complication will be avoided Outcome: Adequate for Discharge   Problem: Activity: Goal: Risk for activity intolerance will decrease Outcome: Adequate for Discharge   Problem: Nutrition: Goal: Adequate nutrition will be maintained Outcome: Adequate for Discharge   Problem: Coping: Goal: Level of anxiety will decrease Outcome: Adequate for Discharge   Problem: Elimination: Goal: Will not experience complications related to bowel motility Outcome: Adequate for Discharge Goal: Will  not experience complications related to urinary retention Outcome: Adequate for Discharge   Problem: Pain Managment: Goal: General experience of comfort will improve Outcome: Adequate for Discharge   Problem: Safety: Goal: Ability to remain free from injury will improve Outcome: Adequate for Discharge   Problem: Skin Integrity: Goal: Risk for impaired skin integrity will decrease Outcome: Adequate for Discharge   Problem: Altered Nutrition-Related Laboratory Values ([place]-2.2) Goal: Food and/or nutrient delivery Description: Individualized approach for food/nutrient provision. Outcome: Adequate for Discharge

## 2023-03-24 ENCOUNTER — Encounter (HOSPITAL_COMMUNITY): Payer: Self-pay | Admitting: Emergency Medicine

## 2023-03-24 ENCOUNTER — Emergency Department (HOSPITAL_COMMUNITY)
Admission: EM | Admit: 2023-03-24 | Discharge: 2023-03-24 | Disposition: A | Discharge status: Home / Self Care | Payer: Medicare Other | Attending: Emergency Medicine | Admitting: Emergency Medicine

## 2023-03-24 ENCOUNTER — Other Ambulatory Visit: Payer: Self-pay

## 2023-03-24 DIAGNOSIS — E119 Type 2 diabetes mellitus without complications: Secondary | ICD-10-CM | POA: Diagnosis not present

## 2023-03-24 DIAGNOSIS — Z7982 Long term (current) use of aspirin: Secondary | ICD-10-CM | POA: Insufficient documentation

## 2023-03-24 DIAGNOSIS — R4182 Altered mental status, unspecified: Secondary | ICD-10-CM | POA: Diagnosis present

## 2023-03-24 DIAGNOSIS — E162 Hypoglycemia, unspecified: Secondary | ICD-10-CM | POA: Diagnosis not present

## 2023-03-24 DIAGNOSIS — Z794 Long term (current) use of insulin: Secondary | ICD-10-CM | POA: Insufficient documentation

## 2023-03-24 LAB — COMPREHENSIVE METABOLIC PANEL
ALT: 28 U/L (ref 0–44)
AST: 39 U/L (ref 15–41)
Albumin: 3.3 g/dL — ABNORMAL LOW (ref 3.5–5.0)
Alkaline Phosphatase: 123 U/L (ref 38–126)
Anion gap: 8 (ref 5–15)
BUN: 17 mg/dL (ref 8–23)
CO2: 28 mmol/L (ref 22–32)
Calcium: 9.1 mg/dL (ref 8.9–10.3)
Chloride: 102 mmol/L (ref 98–111)
Creatinine, Ser: 0.56 mg/dL (ref 0.44–1.00)
GFR, Estimated: >60 mL/min (ref >60)
Glucose, Bld: 124 mg/dL — ABNORMAL HIGH (ref 70–99)
Potassium: 4 mmol/L (ref 3.5–5.1)
Sodium: 138 mmol/L (ref 135–145)
Total Bilirubin: 0.8 mg/dL (ref 0.3–1.2)
Total Protein: 7.7 g/dL (ref 6.5–8.1)

## 2023-03-24 LAB — CBC
HCT: 35.3 % — ABNORMAL LOW (ref 36.0–46.0)
Hemoglobin: 11 g/dL — ABNORMAL LOW (ref 12.0–15.0)
MCH: 26.1 pg (ref 26.0–34.0)
MCHC: 31.2 g/dL (ref 30.0–36.0)
MCV: 83.8 fL (ref 80.0–100.0)
Platelets: 234 10*3/uL (ref 150–400)
RBC: 4.21 MIL/uL (ref 3.87–5.11)
RDW: 16.3 % — ABNORMAL HIGH (ref 11.5–15.5)
WBC: 3.5 10*3/uL — ABNORMAL LOW (ref 4.0–10.5)
nRBC: 0 % (ref 0.0–0.2)

## 2023-03-24 LAB — CBG MONITORING, ED
Glucose-Capillary: 132 mg/dL — ABNORMAL HIGH (ref 70–99)
Glucose-Capillary: 66 mg/dL — ABNORMAL LOW (ref 70–99)
Glucose-Capillary: 173 mg/dL — ABNORMAL HIGH (ref 70–99)

## 2023-03-24 MED ORDER — DEXTROSE 50 % IV SOLN
25.0000 g | Freq: Once | INTRAVENOUS | Status: AC
  Administered 2023-03-24: 25 g via INTRAVENOUS
  Filled 2023-03-24: qty 50

## 2023-03-24 MED ORDER — POTASSIUM CHLORIDE 2 MEQ/ML IV SOLN: INTRAVENOUS | Status: DC

## 2023-03-24 NOTE — Discharge Instructions (Addendum)
It was our pleasure to provide your ER care today - we hope that you feel better.  If you begin to feel your blood sugar may be low - eat or drink something immediately and check your glucose level. Make sure to check your blood glucose level before taking your insulin.  Also, make sure to eat meals regularly, and not skip or delay meals if/when taking your insulin.  Check your blood glucose level 4x/day (before meals and at nighttime) and record values.   Follow up closely with primary care doctor this coming week and bring a record of your blood sugars to that appointment.   Return to ER if worse, new symptoms, fevers, recurrently low blood sugars, persistent vomiting, weak/fainting, trouble breathing, infection of wound wound, or other concern.

## 2023-03-24 NOTE — ED Provider Notes (Signed)
[place] EMERGENCY DEPARTMENT AT [facility] Provider Note   CSN: [id number] Arrival date & time: 03/24/23  7829     History {Add pertinent medical, surgical, social history, OB history to HPI:1} Chief Complaint  Patient presents with   Hypoglycemia    [name] is a 72 y.o. female.  Patient w hx iddm, presents with acute alteration in mental status. Pt was given her AM insulin today, blood sugar not checked prior to administration of insulin. EMS was called and CBG 38. Unclear when patient last ate/drank. No noted change in insulin dose today. No fevers. No reports of trauma/fall. No vomiting. Pt limited historian, level 5 caveat.  EMS gave glucagon.   The history is provided by the patient, medical records and the EMS personnel. The history is limited by the condition of the patient.  Hypoglycemia      Home Medications Prior to Admission medications   Medication Sig Start Date End Date Taking? Authorizing Provider  acetaminophen (TYLENOL) 325 MG tablet Take 2 tablets (650 mg total) by mouth every 6 (six) hours as needed for mild pain (or Fever >/= 101). 03/21/23   Joseph Art, DO  albuterol (VENTOLIN HFA) 108 (90 Base) MCG/ACT inhaler TAKE 2 PUFFS BY MOUTH EVERY 6 HOURS AS NEEDED FOR WHEEZE OR SHORTNESS OF BREATH Patient not taking: Reported on 03/13/2023 12/03/22   Ardith Dark, MD  ARIPiprazole (ABILIFY) 15 MG tablet Take 1 tablet (15 mg total) by mouth 2 (two) times daily. 03/21/23   Joseph Art, DO  aspirin EC 81 MG tablet Take by mouth.    [provider]  benztropine (COGENTIN) 1 MG tablet Take 1 tablet (1 mg total) by mouth at bedtime. 03/02/23   Meredeth Ide, MD  buPROPion (WELLBUTRIN) 75 MG tablet Take 1 tablet (75 mg total) by mouth 2 (two) times daily. 03/02/23   Meredeth Ide, MD  Continuous Blood Gluc Receiver (DEXCOM G6 RECEIVER) DEVI Use as directed for continuous glucose monitoring. 05/09/21   [provider]  Continuous  Blood Gluc Sensor (FREESTYLE LIBRE 2 SENSOR) MISC 1 each by Does not apply route every 14 (fourteen) days. 01/14/23   Ardith Dark, MD  haloperidol (HALDOL) 1 MG tablet Take 1 tablet (1 mg total) by mouth 2 (two) times daily. 03/21/23   Joseph Art, DO  insulin aspart (NOVOLOG) 100 UNIT/ML injection Inject 0-15 Units into the skin 3 (three) times daily with meals. 03/21/23   Joseph Art, DO  insulin aspart (NOVOLOG) 100 UNIT/ML injection Inject 4 Units into the skin 3 (three) times daily with meals. 03/21/23   Joseph Art, DO  insulin glargine-yfgn (SEMGLEE) 100 UNIT/ML injection Inject 0.2 mLs (20 Units total) into the skin daily. 03/21/23   Joseph Art, DO  Insulin Pen Needle 32G X 4 MM MISC Use with Insulin pen 03/02/23   Meredeth Ide, MD  Insulin Syringe-Needle U-100 (INSULIN SYRINGE .5CC/31GX5/16") 31G X 5/16" 0.5 ML MISC Use as directed 03/02/23   Meredeth Ide, MD  leptospermum manuka honey (MEDIHONEY) PSTE paste Apply 1 Application topically daily. 03/21/23   Joseph Art, DO  Multiple Vitamin (MULTIVITAMIN WITH MINERALS) TABS tablet Take 1 tablet by mouth daily. 03/21/23   Joseph Art, DO  nystatin (MYCOSTATIN/NYSTOP) powder Apply topically 3 (three) times daily. 03/21/23   Joseph Art, DO      Allergies    Penicillins    Review of Systems   Review  of Systems  Unable to perform ROS: Mental status change  Constitutional:  Negative for fever.    Physical Exam Updated Vital Signs BP 125/61   Pulse 69   Temp 97.7 F (36.5 C) (Oral)   Resp 15   Ht 1.651 m (5\' 5" )   Wt 75 kg   SpO2 97%   BMI 27.51 kg/m  Physical Exam Vitals and nursing note reviewed.  Constitutional:      Appearance: Normal appearance. She is well-developed.  HENT:     Head: Atraumatic.     Nose: Nose normal.     Mouth/Throat:     Mouth: Mucous membranes are moist.  Eyes:     General: No scleral icterus.    Conjunctiva/sclera: Conjunctivae normal.     Pupils: Pupils are equal, round, and  reactive to light.  Neck:     Trachea: No tracheal deviation.     Comments: No stiffness or rigidity.  Cardiovascular:     Rate and Rhythm: Normal rate and regular rhythm.     Pulses: Normal pulses.     Heart sounds: Normal heart sounds. No murmur heard.    No friction rub. No gallop.  Pulmonary:     Effort: Pulmonary effort is normal. No respiratory distress.     Breath sounds: Normal breath sounds.  Abdominal:     General: Bowel sounds are normal. There is no distension.     Palpations: Abdomen is soft.     Tenderness: There is no abdominal tenderness. There is no guarding.  Genitourinary:    Comments: No cva tenderness.  Musculoskeletal:        General: No swelling or tenderness.     Cervical back: Normal range of motion and neck supple. No rigidity. No muscular tenderness.     Comments: Right foot wound/superficial ulcer without sign of infection.   Skin:    General: Skin is warm and dry.     Findings: No rash.  Neurological:     Mental Status: She is alert.     Comments: Initially confused, altered. Moving bilateral extremities purposefully.      ED Results / Procedures / Treatments   Labs (all labs ordered are listed, but only abnormal results are displayed) Results for orders placed or performed during the hospital encounter of 03/24/23  CBC  Result Value Ref Range   WBC 3.5 (L) 4.0 - 10.5 K/uL   RBC 4.21 3.87 - 5.11 MIL/uL   Hemoglobin 11.0 (L) 12.0 - 15.0 g/dL   HCT 16.1 (L) 09.6 - 04.5 %   MCV 83.8 80.0 - 100.0 fL   MCH 26.1 26.0 - 34.0 pg   MCHC 31.2 30.0 - 36.0 g/dL   RDW 40.9 (H) 81.1 - 91.4 %   Platelets 234 150 - 400 K/uL   nRBC 0.0 0.0 - 0.2 %  Comprehensive metabolic panel  Result Value Ref Range   Sodium 138 135 - 145 mmol/L   Potassium 4.0 3.5 - 5.1 mmol/L   Chloride 102 98 - 111 mmol/L   CO2 28 22 - 32 mmol/L   Glucose, Bld 124 (H) 70 - 99 mg/dL   BUN 17 8 - 23 mg/dL   Creatinine, Ser 7.82 0.44 - 1.00 mg/dL   Calcium 9.1 8.9 - 95.6 mg/dL    Total Protein 7.7 6.5 - 8.1 g/dL   Albumin 3.3 (L) 3.5 - 5.0 g/dL   AST 39 15 - 41 U/L   ALT 28 0 - 44 U/L  Alkaline Phosphatase 123 38 - 126 U/L   Total Bilirubin 0.8 0.3 - 1.2 mg/dL   GFR, Estimated >16 >10 mL/min   Anion gap 8 5 - 15  CBG monitoring, ED  Result Value Ref Range   Glucose-Capillary 132 (H) 70 - 99 mg/dL  POC CBG, ED  Result Value Ref Range   Glucose-Capillary 66 (L) 70 - 99 mg/dL  POC CBG, ED  Result Value Ref Range   Glucose-Capillary 173 (H) 70 - 99 mg/dL   DG CHEST PORT 1 VIEW  Result Date: 03/18/2023 CLINICAL DATA:  Acute respiratory failure with hypoxia EXAM: PORTABLE CHEST 1 VIEW COMPARISON:  Radiographs 03/13/2023 FINDINGS: Stable cardiomediastinal silhouette. Similar bilateral interstitial coarsening. Patchy areas of atelectasis or infiltrate are unchanged. Small right pleural effusion. No pneumothorax. IMPRESSION: 1. Unchanged bilateral interstitial coarsening and patchy areas of atelectasis or pneumonia. 2. New small right pleural effusion. Electronically Signed   By: Minerva Fester M.D.   On: 03/18/2023 17:59   US ARTERIAL ABI (SCREENING LOWER EXTREMITY)  Result Date: 03/14/2023 CLINICAL DATA:  Diabetic foot ulcer EXAM: NONINVASIVE PHYSIOLOGIC VASCULAR STUDY OF BILATERAL LOWER EXTREMITIES TECHNIQUE: Evaluation of both lower extremities were performed at rest, including calculation of ankle-brachial indices with single level pressure measurements and doppler recording. COMPARISON:  None available. FINDINGS: Right ABI:  1.29 Left ABI:  1.28 Right Lower Extremity: Monophasic posterior tibial and dorsalis pedis artery waveforms. Left Lower Extremity: Monophasic posterior tibial and dorsalis pedis artery waveforms. IMPRESSION: Although ankle-brachial indices are within normal limits, bilateral monophasic dorsalis pedis and posterior tibial artery waveforms are suspicious for underlying arterial occlusive disease. Further evaluation with CT angiography of the lower  extremities would be beneficial. Electronically Signed   By: Acquanetta Belling M.D.   On: 03/14/2023 10:58   MR FOOT RIGHT W WO CONTRAST  Result Date: 03/14/2023 CLINICAL DATA:  Right foot pain.  Open wound. EXAM: MRI OF THE RIGHT FOREFOOT WITHOUT AND WITH CONTRAST TECHNIQUE: Multiplanar, multisequence MR imaging of the right foot was performed before and after the administration of intravenous contrast. CONTRAST:  7mL GADAVIST GADOBUTROL 1 MMOL/ML IV SOLN COMPARISON:  Radiographs 03/14/2023 FINDINGS: There is an open wound noted on the plantar aspect of the forefoot between the first and second toes. No subcutaneous abscess is identified. There is diffuse severe cellulitis involving the entire foot. Mild diffuse myofasciitis but no findings for pyomyositis. No MR findings suspicious for septic arthritis or osteomyelitis. The major tendons and ligaments appear intact. IMPRESSION: 1. Open wound on the plantar aspect of the forefoot between the first and second toes. No subcutaneous abscess. 2. Diffuse severe cellulitis involving the entire foot. Mild diffuse myofasciitis but no findings for pyomyositis. 3. No MR findings suspicious for septic arthritis or osteomyelitis. Electronically Signed   By: Rudie Meyer M.D.   On: 03/14/2023 09:43   DG Foot Complete Right  Result Date: 03/14/2023 CLINICAL DATA:  Foot ulcer EXAM: RIGHT FOOT COMPLETE - 3+ VIEW COMPARISON:  None Available. FINDINGS: There is no evidence of fracture or dislocation. There is no evidence of arthropathy or other focal bone abnormality. Soft tissues are unremarkable. IMPRESSION: Negative. Electronically Signed   By: Charlett Nose M.D.   On: 03/14/2023 00:48   DG Chest Port 1 View  Result Date: 03/13/2023 CLINICAL DATA:  Unresponsive. EXAM: PORTABLE CHEST 1 VIEW COMPARISON:  February 25, 2023 FINDINGS: The heart size and mediastinal contours are within normal limits. Mild to moderate severity diffusely increased interstitial lung markings are seen.  This  is mildly increased in severity when compared to the prior study. Mild superimposed areas of atelectasis and/or infiltrate are noted within the lateral aspect of the right upper lobe and left lung base. There is no evidence of a pleural effusion or pneumothorax. The visualized skeletal structures are unremarkable. IMPRESSION: Mild right upper lobe and left basilar airspace disease with a mild superimposed component of interstitial edema. Electronically Signed   By: Aram Candela M.D.   On: 03/13/2023 22:03   DG Foot Complete Right  Result Date: 03/12/2023 CLINICAL DATA:  Cellulitis throughout foot, diabetic foot wound on the base of the big toe and 2nd digit. Foot red and swollen EXAM: RIGHT FOOT COMPLETE - 3+ VIEW COMPARISON:  None available FINDINGS: No fracture or dislocation. Diffuse osteopenia.  No focal erosions to indicate osteomyelitis. Mild swelling of the dorsal forefoot soft tissues. IMPRESSION: 1. Mild swelling of the dorsal forefoot soft tissues. No focal erosions to indicate osteomyelitis. 2. Diffuse osteopenia. Electronically Signed   By: Acquanetta Belling M.D.   On: 03/12/2023 11:10   DG Chest 2 View  Result Date: 02/25/2023 CLINICAL DATA:  Cough, diminished lung sounds. EXAM: CHEST - 2 VIEW COMPARISON:  02/12/2023. FINDINGS: Patient is rotated. Trachea is midline. Heart size stable. Thoracic aorta is calcified. Increased streaky densities in the right upper lobe. Mild streaky scarring in the lung bases. No pleural fluid. IMPRESSION: Slight increase in streaky densities in the right upper lobe may represent infectious bronchiolitis. Electronically Signed   By: Leanna Battles M.D.   On: 02/25/2023 15:47    EKG None  Radiology No results found.  Procedures Procedures  {Document cardiac monitor, telemetry assessment procedure when appropriate:1}  Medications Ordered in ED Medications  dextrose 50 % solution 25 g (25 g Intravenous Given 03/24/23 1153)    ED Course/ Medical  Decision Making/ A&P   {   Click here for ABCD2, HEART and other calculatorsREFRESH Note before signing :1}                          Medical Decision Making Problems Addressed: Acute alteration in mental status: acute illness or injury with systemic symptoms that poses a threat to life or bodily functions Hypoglycemia: acute illness or injury with systemic symptoms that poses a threat to life or bodily functions 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 Labs: ordered. Decision-making details documented in ED Course.  Risk Prescription drug management.   Iv ns. Continuous pulse ox and cardiac monitoring. Labs ordered/sent.   Differential diagnosis includes hypoglycemia, dehydration, infection, etc. Dispo decision including potential need for admission considered - will get labs and reassess.   Reviewed nursing notes and prior charts for additional history. External reports reviewed. Additional history from: ems.   Cardiac monitor: sinus rhythm, rate 60.  Labs reviewed/interpreted by me - glucose 132.   Temp low. Lawyer.   Po fluids/food tray.   Patient has eaten/drank. No new c/o. Awake, alert, oriented. Ambulatory to bathroom, steady gait.  Repeat glucose 173.   Pt currently appears stable for d/c.  Rec close pcp f/u.  Return precautions provided.    {Document critical care time when appropriate:1} {Document review of labs and clinical decision tools ie heart score,  Chads2Vasc2 etc:1}  {Document your independent review of radiology images, and any outside records:1} {Document your discussion with family members, caretakers, and with consultants:1} {Document social determinants of health affecting  pt's care:1} {Document your decision making why or why not admission, treatments were needed:1} Final Clinical Impression(s) / ED Diagnoses Final diagnoses:  Hypoglycemia  Insulin dependent type 2 diabetes mellitus (HCC)  Acute alteration in mental status    Rx / DC Orders ED Discharge Orders     None

## 2023-03-24 NOTE — ED Notes (Signed)
Patient responding appropriately to all questions

## 2023-03-24 NOTE — ED Notes (Signed)
Pts core temp 92.4. MD made aware. Pt placed on bair hugger

## 2023-03-24 NOTE — ED Triage Notes (Addendum)
Pt arrived by EMS. Pt found unresponsive. Husband gave multiple doses of insulin without checking CBG. Cbg 38 for EMS. 1mg  of glucagon IM given by EMS. Pt is alert in triage.

## 2023-03-26 ENCOUNTER — Encounter (HOSPITAL_COMMUNITY): Payer: Self-pay | Admitting: *Deleted

## 2023-03-26 ENCOUNTER — Other Ambulatory Visit (HOSPITAL_COMMUNITY): Payer: Self-pay

## 2023-03-26 ENCOUNTER — Other Ambulatory Visit: Payer: Self-pay

## 2023-03-26 ENCOUNTER — Emergency Department (HOSPITAL_COMMUNITY)
Admission: EM | Admit: 2023-03-26 | Discharge: 2023-03-26 | Disposition: A | Discharge status: Home / Self Care | Payer: Medicare Other | Attending: Emergency Medicine | Admitting: Emergency Medicine

## 2023-03-26 DIAGNOSIS — E162 Hypoglycemia, unspecified: Secondary | ICD-10-CM | POA: Diagnosis present

## 2023-03-26 DIAGNOSIS — Z794 Long term (current) use of insulin: Secondary | ICD-10-CM | POA: Diagnosis not present

## 2023-03-26 DIAGNOSIS — R4182 Altered mental status, unspecified: Secondary | ICD-10-CM | POA: Diagnosis not present

## 2023-03-26 DIAGNOSIS — Z7982 Long term (current) use of aspirin: Secondary | ICD-10-CM | POA: Diagnosis not present

## 2023-03-26 LAB — COMPREHENSIVE METABOLIC PANEL
ALT: 32 U/L (ref 0–44)
AST: 36 U/L (ref 15–41)
Albumin: 3.5 g/dL (ref 3.5–5.0)
Alkaline Phosphatase: 107 U/L (ref 38–126)
Anion gap: 9 (ref 5–15)
BUN: 24 mg/dL — ABNORMAL HIGH (ref 8–23)
CO2: 24 mmol/L (ref 22–32)
Calcium: 8.6 mg/dL — ABNORMAL LOW (ref 8.9–10.3)
Chloride: 104 mmol/L (ref 98–111)
Creatinine, Ser: 0.73 mg/dL (ref 0.44–1.00)
GFR, Estimated: >60 mL/min (ref >60)
Glucose, Bld: 153 mg/dL — ABNORMAL HIGH (ref 70–99)
Potassium: 4 mmol/L (ref 3.5–5.1)
Sodium: 137 mmol/L (ref 135–145)
Total Bilirubin: 0.7 mg/dL (ref 0.3–1.2)
Total Protein: 7.7 g/dL (ref 6.5–8.1)

## 2023-03-26 LAB — CBG MONITORING, ED: Glucose-Capillary: 227 mg/dL — ABNORMAL HIGH (ref 70–99)

## 2023-03-26 LAB — CBC WITH DIFFERENTIAL/PLATELET
Abs Immature Granulocytes: 0.01 10*3/uL (ref 0.00–0.07)
Basophils Absolute: 0.1 10*3/uL (ref 0.0–0.1)
Basophils Relative: 1 %
Eosinophils Absolute: 0.1 10*3/uL (ref 0.0–0.5)
Eosinophils Relative: 3 %
HCT: 36.8 % (ref 36.0–46.0)
Hemoglobin: 11.2 g/dL — ABNORMAL LOW (ref 12.0–15.0)
Immature Granulocytes: 0 %
Lymphocytes Relative: 23 %
Lymphs Abs: 0.9 10*3/uL (ref 0.7–4.0)
MCH: 26 pg (ref 26.0–34.0)
MCHC: 30.4 g/dL (ref 30.0–36.0)
MCV: 85.6 fL (ref 80.0–100.0)
Monocytes Absolute: 0.2 10*3/uL (ref 0.1–1.0)
Monocytes Relative: 6 %
Neutro Abs: 2.4 10*3/uL (ref 1.7–7.7)
Neutrophils Relative %: 67 %
Platelets: 238 10*3/uL (ref 150–400)
RBC: 4.3 MIL/uL (ref 3.87–5.11)
RDW: 17.1 % — ABNORMAL HIGH (ref 11.5–15.5)
WBC: 3.7 10*3/uL — ABNORMAL LOW (ref 4.0–10.5)
nRBC: 0 % (ref 0.0–0.2)

## 2023-03-26 LAB — ETHANOL: Alcohol, Ethyl (B): <10 mg/dL (ref <10)

## 2023-03-26 MED ORDER — NOVOLIN 70/30 (70-30) 100 UNIT/ML ~~LOC~~ SUSP: 13.0000 [IU] | Freq: Two times a day (BID) | SUBCUTANEOUS | 11 refills | Status: DC

## 2023-03-26 MED ORDER — SODIUM CHLORIDE 0.9 % IV BOLUS
1000.0000 mL | Freq: Once | INTRAVENOUS | Status: AC
  Administered 2023-03-26: 1000 mL via INTRAVENOUS

## 2023-03-26 NOTE — ED Triage Notes (Signed)
Pt brought in by [facility] from home with c/o hypoglycemia this morning. EMS reports pt has not eaten or taken her insulin recently. Initial CBG 49. IV was not able to be established, glucagon given. Repeat CBG 89. BP 170/80 for EMS. Pt was initially incoherent per EMS, but upon arrival to ED she is A&O x.4

## 2023-03-26 NOTE — Inpatient Diabetes Management (Addendum)
Inpatient Diabetes Program Recommendations  AACE/ADA: New Consensus Statement on Inpatient Glycemic Control (2015)  Target Ranges:  Prepandial:   less than 140 mg/dL      Peak postprandial:   less than 180 mg/dL (1-2 hours)      Critically ill patients:  140 - 180 mg/dL   Lab Results  Component Value Date   GLUCAP 173 (H) 03/24/2023   HGBA1C 14.1 (H) 02/13/2023    Review of Glycemic Control  Diabetes history: DM Outpatient Diabetes medications: Has been on Tresiba, Novolog, 70/30, and [place] is the past   Inpatient Diabetes Program Recommendations:    Referral received for assistance with home insulins.  Patient to ED with severe hypoglycemia.  This is her 3rd ED visit in 2 months with uncontrolled blood sugars.  Spoke with her on the phone as she is at [facility] and this DM coordinator is at [facility] today.  She does not know what insulins she takes nor how many units.  Spoke with her boyfriend; he administers her insulins.  He states they have [place], Novolog , Novolog 70/30, Helix and some other long acting insulin at home.  He states he gives her 10 units of the fast acting and 38-44 units of the long acting.  He tells me the Novolog is the long acting.  Explained that Novolog is the rapid insulin and should be given with meals.  The long acting should be Lantus, Semglee, Basaglar or [place].  He says, "oh, I thought Novolog was the long acting".  They have a glucometer and do not check blood sugars.  When asked why they do not check her BG, he states, "well, we had one then lost it and we just got a new one".    Asked pharmacy for a benefit check.  Her insurance is not active.  I worry they have a lot of random insulin pens at home and administer insulin without knowing which insulins are being administering.  I feel they need a very simple regimen.  When DC'd on 5/5 the DC orders were for Semglee 20 units QD, Novolog 4 units TID and SSI TID.  I feel SSI is too complicated for them.    For  DC please consider Novolin 70/30 13 units BID (18.2 units of basal insulin QD and 3.9 units of meal coverage BID-this is a weight based dose for 75 kg X 0.25 units).  They should discard any remaining insulins at home.    Please ask if TOC can assist with a MATCH.  They can obtain a box of 5 insulin pens thereafter at [employer] for $43 or a vial for $25.  I recommend home health nursing as well.   Will continue to follow while inpatient.  Thank you, Dulce Sellar, MSN, CDCES Diabetes Coordinator Inpatient Diabetes Program [phone] (team pager from 8a-5p)

## 2023-03-26 NOTE — Care Management (Addendum)
Transition of Care [facility]) - Emergency Department Mini Assessment   Patient Details  Name: [name] MRN: [id number] Date of Birth: Jan 20, 1951  Transition of Care [facility]) CM/SW Contact:    Lavenia Atlas, RN Phone Number: 03/26/2023, 5:54 PM   Clinical Narrative: This RNCM received call from paramedic Molli Hazard regarding medication assistance. Per chart review patient is not eligible for [facility] program due to having insurance. This RNCM spoke with patient via telephone who reports she has Medicare and normally pays a copay. This RNCM explain that she is not eligible for [facility] programs for medication assistance. This RNCM encouraged patient to use [facility] as it will be less expensive. Patient reports she does not wish [facility] and [facility]. Patient then thanked this RNCM for her time and disconnected the call.   No additional TOC needs at this time.     ED Mini Assessment: What brought you to the Emergency Department? : Pt found unresponsive, blood glucose 38  Barriers to Discharge: No Barriers Identified  Barrier interventions: medication review  Means of departure: Car  Interventions which prevented an admission or readmission: Medication Review    Patient Contact and Communications        ,            CMS Medicare.gov Compare Post Acute Care list provided to:: Patient Choice offered to / list presented to : Patient  Admission diagnosis:  Low blood sugar Patient Active Problem List   Diagnosis Date Noted   Pulmonary edema 03/19/2023   Normocytic anemia 03/17/2023   Diabetic foot ulcer (HCC) 03/14/2023   Acute respiratory failure with hypoxia (HCC) 03/14/2023   UTI (urinary tract infection) 03/14/2023   Cellulitis of right foot 03/14/2023   Hyperglycemia 02/25/2023   DKA (diabetic ketoacidosis) (HCC) 02/13/2023   Pseudohyponatremia 02/13/2023   Elevated beta-hydroxybutyrate 02/13/2023   Tachycardia 02/13/2023    Protein calorie malnutrition (HCC) 02/13/2023   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 [place] [address] Phone: [phone] Fax: [phone]

## 2023-03-26 NOTE — Discharge Instructions (Signed)
Your insulin regimen has been adjusted.  Please use your new regimen as instructed.  Follow-up with your physician.  Return here for concerning changes in your condition.

## 2023-03-26 NOTE — ED Provider Notes (Signed)
[place] EMERGENCY DEPARTMENT AT [facility] Provider Note   CSN: [id number] Arrival date & time: 03/26/23  1159     History  Chief Complaint  Patient presents with   Hypoglycemia    [name] is a 72 y.o. female.  HPI Patient presents from home via EMS with concern for hypoglycemia/altered mental status. By the time of arrival patient is awake and alert providing her own history, but was seemingly not able to do so earlier in the day.  Patient initially is unclear about her insulin regimen, but after she has been here she notes that she has been taking her medicine as much as possible.  The patient's sister notes that the patient had difficulty obtaining some of her insulin that was prescribed after recent hospitalization, and due to this is only taking long-acting insulin.  Patient notes that she may not have eaten or had anything to drink today.  17-month glucose was 49.  EMS reports no hypotension, but after receiving oral dextrose patient improved, was interactive. Patient denies pain, discomfort.      Home Medications Prior to Admission medications   Medication Sig Start Date End Date Taking? Authorizing Provider  acetaminophen (TYLENOL) 325 MG tablet Take 2 tablets (650 mg total) by mouth every 6 (six) hours as needed for mild pain (or Fever >/= 101). 03/21/23   Joseph Art, DO  albuterol (VENTOLIN HFA) 108 (90 Base) MCG/ACT inhaler TAKE 2 PUFFS BY MOUTH EVERY 6 HOURS AS NEEDED FOR WHEEZE OR SHORTNESS OF BREATH Patient not taking: Reported on 03/13/2023 12/03/22   Ardith Dark, MD  ARIPiprazole (ABILIFY) 15 MG tablet Take 1 tablet (15 mg total) by mouth 2 (two) times daily. 03/21/23   Joseph Art, DO  aspirin EC 81 MG tablet Take by mouth.    [provider]  benztropine (COGENTIN) 1 MG tablet Take 1 tablet (1 mg total) by mouth at bedtime. 03/02/23   Meredeth Ide, MD  buPROPion (WELLBUTRIN) 75 MG tablet Take 1 tablet (75 mg total) by  mouth 2 (two) times daily. 03/02/23   Meredeth Ide, MD  Continuous Blood Gluc Receiver (DEXCOM G6 RECEIVER) DEVI Use as directed for continuous glucose monitoring. 05/09/21   [provider]  Continuous Blood Gluc Sensor (FREESTYLE LIBRE 2 SENSOR) MISC 1 each by Does not apply route every 14 (fourteen) days. 01/14/23   Ardith Dark, MD  haloperidol (HALDOL) 1 MG tablet Take 1 tablet (1 mg total) by mouth 2 (two) times daily. 03/21/23   Joseph Art, DO  insulin aspart (NOVOLOG) 100 UNIT/ML injection Inject 0-15 Units into the skin 3 (three) times daily with meals. 03/21/23   Joseph Art, DO  insulin aspart (NOVOLOG) 100 UNIT/ML injection Inject 4 Units into the skin 3 (three) times daily with meals. 03/21/23   Joseph Art, DO  insulin glargine-yfgn (SEMGLEE) 100 UNIT/ML injection Inject 0.2 mLs (20 Units total) into the skin daily. 03/21/23   Joseph Art, DO  Insulin Pen Needle 32G X 4 MM MISC Use with Insulin pen 03/02/23   Meredeth Ide, MD  Insulin Syringe-Needle U-100 (INSULIN SYRINGE .5CC/31GX5/16") 31G X 5/16" 0.5 ML MISC Use as directed 03/02/23   Meredeth Ide, MD  leptospermum manuka honey (MEDIHONEY) PSTE paste Apply 1 Application topically daily. 03/21/23   Joseph Art, DO  Multiple Vitamin (MULTIVITAMIN WITH MINERALS) TABS tablet Take 1 tablet by mouth daily. 03/21/23   Joseph Art, DO  nystatin (MYCOSTATIN/NYSTOP) powder Apply topically 3 (three) times daily. 03/21/23   Joseph Art, DO      Allergies    Penicillins    Review of Systems   Review of Systems  All other systems reviewed and are negative.   Physical Exam Updated Vital Signs BP (!) 153/67   Pulse 82   Ht 5\' 5"  (1.651 m)   Wt 75 kg   SpO2 98%   BMI 27.51 kg/m  Physical Exam Vitals and nursing note reviewed.  Constitutional:      General: She is not in acute distress.    Appearance: She is well-developed.  HENT:     Head: Normocephalic and atraumatic.  Eyes:     Conjunctiva/sclera:  Conjunctivae normal.  Cardiovascular:     Rate and Rhythm: Normal rate and regular rhythm.  Pulmonary:     Effort: Pulmonary effort is normal. No respiratory distress.     Breath sounds: Normal breath sounds. No stridor.  Abdominal:     General: There is no distension.  Skin:    General: Skin is warm and dry.  Neurological:     Mental Status: She is alert and oriented to person, place, and time.     Cranial Nerves: No cranial nerve deficit.  Psychiatric:        Mood and Affect: Mood normal.     ED Results / Procedures / Treatments   Labs (all labs ordered are listed, but only abnormal results are displayed) Labs Reviewed  COMPREHENSIVE METABOLIC PANEL - Abnormal; Notable for the following components:      Result Value   Glucose, Bld 153 (*)    BUN 24 (*)    Calcium 8.6 (*)    All other components within normal limits  CBC WITH DIFFERENTIAL/PLATELET - Abnormal; Notable for the following components:   WBC 3.7 (*)    Hemoglobin 11.2 (*)    RDW 17.1 (*)    All other components within normal limits  ETHANOL  URINALYSIS, ROUTINE W REFLEX MICROSCOPIC    EKG None  Radiology No results found.  Procedures Procedures    Medications Ordered in ED Medications  sodium chloride 0.9 % bolus 1,000 mL (1,000 mLs Intravenous New Bag/Given 03/26/23 1253)    ED Course/ Medical Decision Making/ A&P                             Medical Decision Making Adult female presents after episode of decreased interactivity, hypoglycemia.  Differential includes medication error, infection, electrolyte abnormalities. Patient started on continuous monitoring, cardiac 80 sinus normal Pulse ox 100% room air normal Labs sent, glucose checked frequently. Patient remained awake and alert throughout hours of monitoring, and after family members arrived, the patient's sister and her boyfriend, I became clear the patient's insulin regimen has not been adhered to due to inability to obtain all of the  products, and she has been not eating and drinking in an adjusted manner given her new regimen. I discussed case with our diabetes coordinator, and patient's other findings are reassuring, she is appropriate for discharge after adjustment to her regimen per diabetes coordinator.   Amount and/or Complexity of Data Reviewed Independent Historian: friend    Details: Boyfriend and sister External Data Reviewed: notes.    Details: ED visit from 2 days ago reviewed, essentially similar presentation Labs: ordered. Decision-making details documented in ED Course.  Risk Prescription drug management. Decision regarding hospitalization.   On  sign-out, rec's per diabetes coordinator are pending.       Final Clinical Impression(s) / ED Diagnoses Final diagnoses:  Hypoglycemia    Rx / DC Orders ED Discharge Orders     None         Gerhard Munch, MD 03/26/23 [phone]

## 2023-03-27 ENCOUNTER — Emergency Department (HOSPITAL_COMMUNITY)
Admission: EM | Admit: 2023-03-27 | Discharge: 2023-04-20 | Disposition: E | Payer: Medicare Other | Attending: Emergency Medicine | Admitting: Emergency Medicine

## 2023-03-27 DIAGNOSIS — I469 Cardiac arrest, cause unspecified: Secondary | ICD-10-CM | POA: Insufficient documentation

## 2023-03-27 DIAGNOSIS — Z794 Long term (current) use of insulin: Secondary | ICD-10-CM | POA: Diagnosis not present

## 2023-03-27 DIAGNOSIS — Z7982 Long term (current) use of aspirin: Secondary | ICD-10-CM | POA: Insufficient documentation

## 2023-03-27 LAB — CBG MONITORING, ED: Glucose-Capillary: 181 mg/dL — ABNORMAL HIGH (ref 70–99)

## 2023-03-27 MED ORDER — DEXTROSE 50 % IV SOLN
1.0000 | Freq: Once | INTRAVENOUS | Status: AC
  Administered 2023-03-27: 50 mL via INTRAVENOUS

## 2023-03-27 MED ORDER — EPINEPHRINE 1 MG/10ML IJ SOSY
1.0000 mg | PREFILLED_SYRINGE | Freq: Once | INTRAMUSCULAR | Status: AC
  Administered 2023-03-27: 1 mg via INTRAVENOUS

## 2023-04-18 ENCOUNTER — Ambulatory Visit: Payer: Medicare Other | Admitting: Family Medicine

## 2023-04-20 NOTE — ED Triage Notes (Addendum)
Pt BIB [facility] in cardiac arrest; ems reports they were called out to the residence for unresponsive pt and a cbg of 23; pt was given an IV of D5 and cbg came up to 190 en route  When they arrived they found pt with apneic breathing but had a pulse, they began to bag her and while en route they lost pulse. CPR was started at 1012  Pt was given 3 rounds of epi en route by ems before arrival with last dose at 1026  Pulse check at 1032 and 1037 and 1043 and 1047 and pt was asystole both checks  Pt was intubated at 1034 with 7.5 ETT  Pt given epi at 1033, 1610,9604, 1045 Pt was given amp of D50 at 1040  Cbg was 181    TOD 1047  Pt has 22G IV left hand and 20G in RAC   EMS reports that boyfriend told them pt would not wake up this am so he gave her 10 units of insulin before they arrived

## 2023-04-20 NOTE — ED Provider Notes (Signed)
EMERGENCY DEPARTMENT AT [facility] Provider Note   CSN: [id number] Arrival date & time: 04/01/2023  1029     History  No chief complaint on file.   [name] is a 72 y.o. female.  HPI 72 year old female presents in cardiac arrest.  History is primarily from the paramedics as the patient is an active CPR.  Patient had recurrent hypoglycemia, has been here a couple times in the last few days for the same.  EMS also reported the husband gave her 10 units of insulin but did not check the glucose this morning.  The patient was found to have a glucose in the 20s and was given dextrose but at some point developed hypoxia and ultimately a respiratory arrest.  CPR was started  Home Medications Prior to Admission medications   Medication Sig Start Date End Date Taking? Authorizing Provider  acetaminophen (TYLENOL) 325 MG tablet Take 2 tablets (650 mg total) by mouth every 6 (six) hours as needed for mild pain (or Fever >/= 101). 03/21/23   Joseph Art, DO  albuterol (VENTOLIN HFA) 108 (90 Base) MCG/ACT inhaler TAKE 2 PUFFS BY MOUTH EVERY 6 HOURS AS NEEDED FOR WHEEZE OR SHORTNESS OF BREATH Patient not taking: Reported on 03/13/2023 12/03/22   Ardith Dark, MD  ARIPiprazole (ABILIFY) 15 MG tablet Take 1 tablet (15 mg total) by mouth 2 (two) times daily. 03/21/23   Joseph Art, DO  aspirin EC 81 MG tablet Take by mouth.    [provider]  benztropine (COGENTIN) 1 MG tablet Take 1 tablet (1 mg total) by mouth at bedtime. 03/02/23   Meredeth Ide, MD  buPROPion (WELLBUTRIN) 75 MG tablet Take 1 tablet (75 mg total) by mouth 2 (two) times daily. 03/02/23   Meredeth Ide, MD  Continuous Blood Gluc Receiver (DEXCOM G6 RECEIVER) DEVI Use as directed for continuous glucose monitoring. 05/09/21   [provider]  Continuous Blood Gluc Sensor (FREESTYLE LIBRE 2 SENSOR) MISC 1 each by Does not apply route every 14 (fourteen) days. 01/14/23   Ardith Dark, MD   haloperidol (HALDOL) 1 MG tablet Take 1 tablet (1 mg total) by mouth 2 (two) times daily. 03/21/23   Joseph Art, DO  insulin NPH-regular Human (NOVOLIN 70/30) (70-30) 100 UNIT/ML injection Inject 13 Units into the skin 2 (two) times daily with a meal. 03/26/23   Gerhard Munch, MD  leptospermum manuka honey (MEDIHONEY) PSTE paste Apply 1 Application topically daily. 03/21/23   Joseph Art, DO  Multiple Vitamin (MULTIVITAMIN WITH MINERALS) TABS tablet Take 1 tablet by mouth daily. 03/21/23   Joseph Art, DO  nystatin (MYCOSTATIN/NYSTOP) powder Apply topically 3 (three) times daily. 03/21/23   Joseph Art, DO      Allergies    Penicillins    Review of Systems   Review of Systems  Unable to perform ROS: Patient unresponsive    Physical Exam Updated Vital Signs There were no vitals taken for this visit. Physical Exam Vitals and nursing note reviewed.  Constitutional:      Appearance: She is well-developed.     Interventions: She is intubated (king airway).  HENT:     Head: Normocephalic and atraumatic.  Pulmonary:     Effort: She is intubated (king airway).     Breath sounds: Rhonchi present.  Abdominal:     General: There is no distension.     Palpations: Abdomen is soft.  Skin:  General: Skin is warm and dry.  Neurological:     Mental Status: She is unresponsive.     ED Results / Procedures / Treatments   Labs (all labs ordered are listed, but only abnormal results are displayed) Labs Reviewed  CBG MONITORING, ED - Abnormal; Notable for the following components:      Result Value   Glucose-Capillary 181 (*)    All other components within normal limits    EKG None  Radiology No results found.  Procedures Date/Time: 04/01/2023 10:57 AM  Performed by: Pricilla Loveless, MDPre-anesthesia Checklist: Suction available Oxygen Delivery Method: Ambu bag Laryngoscope Size: Glidescope and 3 Grade View: Grade I Tube size: 7.5 mm Number of attempts: 1 Airway  Equipment and Method: Video-laryngoscopy Placement Confirmation: ETT inserted through vocal cords under direct vision, CO2 detector and Breath sounds checked- equal and bilateral Secured at: 21 cm Tube secured with: ETT holder Dental Injury: Teeth and Oropharynx as per pre-operative assessment     CPR  Date/Time: 03/24/2023 10:58 AM  Performed by: Pricilla Loveless, MD Authorized by: Pricilla Loveless, MD  CPR Procedure Details:    ACLS/BLS initiated by EMS: Yes     CPR/ACLS performed in the ED: Yes     Duration of CPR (minutes):  35   Outcome: Pt declared dead    CPR performed via ACLS guidelines under my direct supervision.  See RN documentation for details including defibrillator use, medications, doses and timing. Ultrasound ED Peripheral IV (Provider)  Date/Time: 04/04/2023 10:58 AM  Performed by: Pricilla Loveless, MD Authorized by: Pricilla Loveless, MD   Procedure details:    Indications: multiple failed IV attempts and poor IV access     Skin Prep: chlorhexidine gluconate     Location:  Right AC   Angiocath:  20 G   Bedside Ultrasound Guided: Yes     Dressing applied: Yes   Ultrasound ED Echo  Date/Time: 04/09/2023 10:59 AM  Performed by: Pricilla Loveless, MD Authorized by: Pricilla Loveless, MD   Procedure details:    Indications: cardiac arrest     Views: subxiphoid   Findings:    Pericardium: no pericardial effusion     Cardiac Activity: no cardiac activity     RV Diameter: normal       Medications Ordered in ED Medications - No data to display  ED Course/ Medical Decision Making/ A&P                             Medical Decision Making Amount and/or Complexity of Data Reviewed Independent Historian: EMS  Risk Prescription drug management.   Patient presents in cardiac arrest.  The inciting factor seems to be hypoglycemia.  Unfortunately, despite the above interventions which included intubation and then multiple rounds of CPR with epinephrine as well as  glucose, we were unable to get ROSC.  I discussed with medical examiner, Benay Pillow, at this point no indication for ME case unless there was concern that she had committed suicide.  It does not seem this way.  I spoke to the boyfriend.  He last saw her normal last night and then this morning she was not waking up as he expected.  However he still gave her 10 units of insulin but then when she continued to not wake up he called 911.  Unfortunately, I think the insulin is what ended up causing this.  However, notes from yesterday were reviewed and it seems like there was  poor medical literacy regarding diabetes management from what the diabetic coordinator noted.  Given all this, I did discuss with police, [employer].  Based on what I have discussed with him, he states that this would not be an investigation for the police for her death.  Significant other stated that he would tell the sister about the patient's unfortunate demise.  Unfortunately the patient expired at 10:47 AM.        Final Clinical Impression(s) / ED Diagnoses Final diagnoses:  None    Rx / DC Orders ED Discharge Orders     None         Pricilla Loveless, MD 03/31/2023 1527

## 2023-04-20 DEATH — deceased
